# Patient Record
Sex: Female | Born: 1960
Health system: Southern US, Community
[De-identification: ages and names within clinical notes are randomized; demographics above are authoritative.]

## PROBLEM LIST (undated history)

## (undated) DIAGNOSIS — R112 Nausea with vomiting, unspecified: Secondary | ICD-10-CM

## (undated) DIAGNOSIS — E669 Obesity, unspecified: Secondary | ICD-10-CM

## (undated) DIAGNOSIS — I890 Lymphedema, not elsewhere classified: Secondary | ICD-10-CM

## (undated) DIAGNOSIS — N189 Chronic kidney disease, unspecified: Secondary | ICD-10-CM

## (undated) DIAGNOSIS — D649 Anemia, unspecified: Secondary | ICD-10-CM

## (undated) DIAGNOSIS — G4730 Sleep apnea, unspecified: Secondary | ICD-10-CM

## (undated) DIAGNOSIS — D219 Benign neoplasm of connective and other soft tissue, unspecified: Secondary | ICD-10-CM

## (undated) DIAGNOSIS — K219 Gastro-esophageal reflux disease without esophagitis: Secondary | ICD-10-CM

## (undated) DIAGNOSIS — I10 Essential (primary) hypertension: Secondary | ICD-10-CM

## (undated) DIAGNOSIS — J45909 Unspecified asthma, uncomplicated: Secondary | ICD-10-CM

## (undated) DIAGNOSIS — E785 Hyperlipidemia, unspecified: Secondary | ICD-10-CM

## (undated) DIAGNOSIS — Z794 Long term (current) use of insulin: Secondary | ICD-10-CM

## (undated) DIAGNOSIS — Z889 Allergy status to unspecified drugs, medicaments and biological substances status: Secondary | ICD-10-CM

## (undated) DIAGNOSIS — E119 Type 2 diabetes mellitus without complications: Secondary | ICD-10-CM

## (undated) DIAGNOSIS — Z9889 Other specified postprocedural states: Secondary | ICD-10-CM

## (undated) HISTORY — DX: Lymphedema, not elsewhere classified: I89.0

## (undated) HISTORY — DX: Long term (current) use of insulin: Z79.4

## (undated) HISTORY — DX: Allergy status to unspecified drugs, medicaments and biological substances: Z88.9

## (undated) HISTORY — PX: DENTAL SURGERY: SHX609

## (undated) HISTORY — DX: Essential (primary) hypertension: I10

## (undated) HISTORY — DX: Chronic kidney disease, unspecified: N18.9

## (undated) HISTORY — DX: Type 2 diabetes mellitus without complications: E11.9

## (undated) HISTORY — PX: EYE SURGERY: SHX253

## (undated) HISTORY — DX: Unspecified asthma, uncomplicated: J45.909

## (undated) HISTORY — DX: Sleep apnea, unspecified: G47.30

## (undated) HISTORY — DX: Anemia, unspecified: D64.9

## (undated) HISTORY — DX: Benign neoplasm of connective and other soft tissue, unspecified: D21.9

## (undated) HISTORY — DX: Hyperlipidemia, unspecified: E78.5

## (undated) HISTORY — DX: Obesity, unspecified: E66.9

## (undated) HISTORY — PX: CATARACT EXTRACTION W/PHACO: SHX586

## (undated) HISTORY — PX: REMOVAL OF IMPLANT: SHX6451

---

## 1994-11-13 DIAGNOSIS — IMO0001 Reserved for inherently not codable concepts without codable children: Secondary | ICD-10-CM

## 1994-11-13 HISTORY — DX: Reserved for inherently not codable concepts without codable children: IMO0001

## 2003-08-04 ENCOUNTER — Ambulatory Visit (HOSPITAL_COMMUNITY): Admission: RE | Admit: 2003-08-04 | Discharge: 2003-08-04 | Payer: Self-pay | Admitting: Obstetrics and Gynecology

## 2003-08-04 ENCOUNTER — Encounter: Payer: Self-pay | Admitting: Obstetrics and Gynecology

## 2003-08-14 ENCOUNTER — Ambulatory Visit (HOSPITAL_COMMUNITY): Admission: RE | Admit: 2003-08-14 | Discharge: 2003-08-14 | Payer: Self-pay | Admitting: Obstetrics and Gynecology

## 2003-08-14 ENCOUNTER — Encounter: Payer: Self-pay | Admitting: Obstetrics and Gynecology

## 2003-11-11 ENCOUNTER — Ambulatory Visit (HOSPITAL_COMMUNITY): Admission: RE | Admit: 2003-11-11 | Discharge: 2003-11-11 | Payer: Self-pay | Admitting: Obstetrics and Gynecology

## 2004-03-09 ENCOUNTER — Ambulatory Visit (HOSPITAL_COMMUNITY): Admission: RE | Admit: 2004-03-09 | Discharge: 2004-03-09 | Payer: Self-pay | Admitting: Obstetrics and Gynecology

## 2004-09-28 ENCOUNTER — Ambulatory Visit (HOSPITAL_COMMUNITY): Admission: RE | Admit: 2004-09-28 | Discharge: 2004-09-28 | Payer: Self-pay | Admitting: Obstetrics and Gynecology

## 2005-05-13 HISTORY — PX: RETINAL DETACHMENT SURGERY: SHX105

## 2005-05-23 ENCOUNTER — Ambulatory Visit: Payer: Self-pay | Admitting: Family Medicine

## 2005-05-31 ENCOUNTER — Ambulatory Visit: Payer: Self-pay | Admitting: Cardiology

## 2005-06-12 ENCOUNTER — Ambulatory Visit (HOSPITAL_COMMUNITY): Admission: RE | Admit: 2005-06-12 | Discharge: 2005-06-12 | Payer: Self-pay | Admitting: Cardiology

## 2005-06-12 ENCOUNTER — Ambulatory Visit: Payer: Self-pay | Admitting: Cardiology

## 2005-06-13 ENCOUNTER — Ambulatory Visit: Payer: Self-pay | Admitting: Family Medicine

## 2005-10-10 ENCOUNTER — Ambulatory Visit (HOSPITAL_COMMUNITY): Admission: RE | Admit: 2005-10-10 | Discharge: 2005-10-10 | Payer: Self-pay | Admitting: Family Medicine

## 2005-12-29 ENCOUNTER — Ambulatory Visit: Payer: Self-pay | Admitting: Family Medicine

## 2006-01-09 ENCOUNTER — Ambulatory Visit: Payer: Self-pay | Admitting: Family Medicine

## 2006-02-12 ENCOUNTER — Ambulatory Visit: Payer: Self-pay | Admitting: Family Medicine

## 2006-03-05 ENCOUNTER — Ambulatory Visit (HOSPITAL_COMMUNITY): Admission: RE | Admit: 2006-03-05 | Discharge: 2006-03-05 | Payer: Self-pay | Admitting: Orthopedic Surgery

## 2006-03-05 ENCOUNTER — Ambulatory Visit: Payer: Self-pay | Admitting: Orthopedic Surgery

## 2006-03-20 ENCOUNTER — Ambulatory Visit (HOSPITAL_COMMUNITY): Admission: RE | Admit: 2006-03-20 | Discharge: 2006-03-20 | Payer: Self-pay | Admitting: Orthopedic Surgery

## 2006-03-29 ENCOUNTER — Ambulatory Visit: Payer: Self-pay | Admitting: Orthopedic Surgery

## 2006-04-03 ENCOUNTER — Ambulatory Visit: Payer: Self-pay | Admitting: Family Medicine

## 2006-07-04 ENCOUNTER — Ambulatory Visit: Payer: Self-pay | Admitting: Family Medicine

## 2006-08-15 ENCOUNTER — Ambulatory Visit: Payer: Self-pay | Admitting: Family Medicine

## 2006-10-11 ENCOUNTER — Ambulatory Visit (HOSPITAL_COMMUNITY): Admission: RE | Admit: 2006-10-11 | Discharge: 2006-10-11 | Payer: Self-pay | Admitting: Family Medicine

## 2006-10-24 ENCOUNTER — Ambulatory Visit: Payer: Self-pay | Admitting: Family Medicine

## 2006-11-13 DIAGNOSIS — I10 Essential (primary) hypertension: Secondary | ICD-10-CM

## 2006-11-13 DIAGNOSIS — E785 Hyperlipidemia, unspecified: Secondary | ICD-10-CM

## 2006-11-13 HISTORY — DX: Essential (primary) hypertension: I10

## 2006-11-13 HISTORY — DX: Hyperlipidemia, unspecified: E78.5

## 2007-01-18 ENCOUNTER — Encounter: Payer: Self-pay | Admitting: Family Medicine

## 2007-01-18 LAB — CONVERTED CEMR LAB: Hgb A1c MFr Bld: 8.6 % — ABNORMAL HIGH (ref 4.6–6.1)

## 2007-01-23 ENCOUNTER — Ambulatory Visit: Payer: Self-pay | Admitting: Family Medicine

## 2007-02-16 ENCOUNTER — Emergency Department (HOSPITAL_COMMUNITY): Admission: EM | Admit: 2007-02-16 | Discharge: 2007-02-16 | Payer: Self-pay | Admitting: Emergency Medicine

## 2007-02-20 ENCOUNTER — Ambulatory Visit (HOSPITAL_COMMUNITY): Admission: RE | Admit: 2007-02-20 | Discharge: 2007-02-20 | Payer: Self-pay | Admitting: Family Medicine

## 2007-02-20 ENCOUNTER — Ambulatory Visit: Payer: Self-pay | Admitting: Family Medicine

## 2007-04-18 ENCOUNTER — Encounter: Payer: Self-pay | Admitting: Family Medicine

## 2007-04-18 LAB — CONVERTED CEMR LAB
ALT: 13 U/L
AST: 13 U/L
Albumin: 4.1 g/dL
Alkaline Phosphatase: 86 U/L
BUN: 14 mg/dL
Basophils Absolute: 0.1 K/uL
Basophils Relative: 2 % — ABNORMAL HIGH
Bilirubin, Direct: 0.1 mg/dL
CO2: 25 meq/L
Calcium: 9.1 mg/dL
Chloride: 103 meq/L
Cholesterol: 168 mg/dL
Creatinine, Ser: 0.75 mg/dL
Eosinophils Absolute: 0.3 K/uL
Eosinophils Relative: 4 %
Glucose, Bld: 86 mg/dL
HCT: 39.4 %
HDL: 55 mg/dL
Hemoglobin: 12.7 g/dL
Hgb A1c MFr Bld: 7.5 % — ABNORMAL HIGH
Indirect Bilirubin: 0.4 mg/dL
LDL Cholesterol: 99 mg/dL
Lymphocytes Relative: 32 %
Lymphs Abs: 2.3 K/uL
MCHC: 32.2 g/dL
MCV: 91 fL
Monocytes Absolute: 0.5 K/uL
Monocytes Relative: 7 %
Neutro Abs: 3.8 K/uL
Neutrophils Relative %: 55 %
Platelets: 358 K/uL
Potassium: 4.4 meq/L
RBC: 4.33 M/uL
RDW: 14.5 % — ABNORMAL HIGH
Sodium: 139 meq/L
Total Bilirubin: 0.5 mg/dL
Total CHOL/HDL Ratio: 3.1
Total Protein: 7.5 g/dL
Triglycerides: 69 mg/dL
VLDL: 14 mg/dL
WBC: 7 10*3/microliter

## 2007-04-25 ENCOUNTER — Ambulatory Visit: Payer: Self-pay | Admitting: Family Medicine

## 2007-06-05 ENCOUNTER — Ambulatory Visit (HOSPITAL_COMMUNITY): Admission: RE | Admit: 2007-06-05 | Discharge: 2007-06-05 | Payer: Self-pay | Admitting: Obstetrics and Gynecology

## 2007-06-06 ENCOUNTER — Ambulatory Visit: Payer: Self-pay | Admitting: Family Medicine

## 2007-07-18 ENCOUNTER — Encounter: Payer: Self-pay | Admitting: Family Medicine

## 2007-07-18 LAB — CONVERTED CEMR LAB: Microalb, Ur: 7.5 mg/dL — ABNORMAL HIGH (ref 0.00–1.89)

## 2007-07-29 ENCOUNTER — Ambulatory Visit (HOSPITAL_COMMUNITY): Admission: RE | Admit: 2007-07-29 | Discharge: 2007-07-29 | Payer: Self-pay | Admitting: Family Medicine

## 2007-10-14 ENCOUNTER — Ambulatory Visit (HOSPITAL_COMMUNITY): Admission: RE | Admit: 2007-10-14 | Discharge: 2007-10-14 | Payer: Self-pay | Admitting: Family Medicine

## 2007-10-28 ENCOUNTER — Ambulatory Visit: Payer: Self-pay | Admitting: Family Medicine

## 2007-10-28 LAB — CONVERTED CEMR LAB
ALT: 11 units/L (ref 0–35)
AST: 13 units/L (ref 0–37)
Albumin: 4.3 g/dL (ref 3.5–5.2)
Alkaline Phosphatase: 120 units/L — ABNORMAL HIGH (ref 39–117)
BUN: 13 mg/dL (ref 6–23)
Bilirubin, Direct: 0.1 mg/dL (ref 0.0–0.3)
CO2: 24 meq/L (ref 19–32)
Calcium: 9.3 mg/dL (ref 8.4–10.5)
Chloride: 101 meq/L (ref 96–112)
Cholesterol: 167 mg/dL (ref 0–200)
Creatinine, Ser: 0.7 mg/dL (ref 0.40–1.20)
Glucose, Bld: 80 mg/dL (ref 70–99)
HDL: 56 mg/dL (ref >39)
Indirect Bilirubin: 0.5 mg/dL (ref 0.0–0.9)
LDL Cholesterol: 94 mg/dL (ref 0–99)
Potassium: 3.9 meq/L (ref 3.5–5.3)
Sodium: 138 meq/L (ref 135–145)
TSH: 2.614 microintl units/mL (ref 0.350–5.50)
Total Bilirubin: 0.6 mg/dL (ref 0.3–1.2)
Total CHOL/HDL Ratio: 3
Total Protein: 7.5 g/dL (ref 6.0–8.3)
Triglycerides: 84 mg/dL (ref <150)
VLDL: 17 mg/dL (ref 0–40)

## 2007-11-11 ENCOUNTER — Ambulatory Visit (HOSPITAL_COMMUNITY): Admission: RE | Admit: 2007-11-11 | Discharge: 2007-11-11 | Payer: Self-pay | Admitting: Family Medicine

## 2008-01-27 ENCOUNTER — Ambulatory Visit: Admission: RE | Admit: 2008-01-27 | Discharge: 2008-01-27 | Payer: Self-pay | Admitting: *Deleted

## 2008-02-18 ENCOUNTER — Ambulatory Visit: Payer: Self-pay | Admitting: Family Medicine

## 2008-02-25 DIAGNOSIS — N183 Chronic kidney disease, stage 3 (moderate): Secondary | ICD-10-CM

## 2008-02-25 DIAGNOSIS — I10 Essential (primary) hypertension: Secondary | ICD-10-CM | POA: Insufficient documentation

## 2008-02-25 DIAGNOSIS — E119 Type 2 diabetes mellitus without complications: Secondary | ICD-10-CM | POA: Insufficient documentation

## 2008-02-25 DIAGNOSIS — E1169 Type 2 diabetes mellitus with other specified complication: Secondary | ICD-10-CM | POA: Insufficient documentation

## 2008-02-25 DIAGNOSIS — E785 Hyperlipidemia, unspecified: Secondary | ICD-10-CM | POA: Insufficient documentation

## 2008-02-25 DIAGNOSIS — E1165 Type 2 diabetes mellitus with hyperglycemia: Secondary | ICD-10-CM

## 2008-02-25 DIAGNOSIS — E782 Mixed hyperlipidemia: Secondary | ICD-10-CM

## 2008-02-25 DIAGNOSIS — E1122 Type 2 diabetes mellitus with diabetic chronic kidney disease: Secondary | ICD-10-CM

## 2008-05-18 ENCOUNTER — Encounter: Payer: Self-pay | Admitting: Family Medicine

## 2008-05-18 LAB — CONVERTED CEMR LAB
ALT: 17 units/L (ref 0–35)
AST: 14 units/L (ref 0–37)
Albumin: 4.2 g/dL (ref 3.5–5.2)
Alkaline Phosphatase: 127 units/L — ABNORMAL HIGH (ref 39–117)
BUN: 13 mg/dL (ref 6–23)
Bilirubin, Direct: 0.1 mg/dL (ref 0.0–0.3)
CO2: 23 meq/L (ref 19–32)
Calcium: 9.3 mg/dL (ref 8.4–10.5)
Chloride: 100 meq/L (ref 96–112)
Cholesterol: 159 mg/dL (ref 0–200)
Creatinine, Ser: 0.79 mg/dL (ref 0.40–1.20)
Glucose, Bld: 116 mg/dL — ABNORMAL HIGH (ref 70–99)
HDL: 52 mg/dL (ref >39)
Indirect Bilirubin: 0.5 mg/dL (ref 0.0–0.9)
LDL Cholesterol: 96 mg/dL (ref 0–99)
Potassium: 4.4 meq/L (ref 3.5–5.3)
Sodium: 136 meq/L (ref 135–145)
Total Bilirubin: 0.6 mg/dL (ref 0.3–1.2)
Total CHOL/HDL Ratio: 3.1
Total Protein: 7.4 g/dL (ref 6.0–8.3)
Triglycerides: 56 mg/dL (ref <150)
VLDL: 11 mg/dL (ref 0–40)

## 2008-05-25 ENCOUNTER — Ambulatory Visit: Payer: Self-pay | Admitting: Family Medicine

## 2008-07-29 ENCOUNTER — Telehealth: Payer: Self-pay | Admitting: Family Medicine

## 2008-09-02 ENCOUNTER — Telehealth: Payer: Self-pay | Admitting: Family Medicine

## 2008-10-14 ENCOUNTER — Ambulatory Visit (HOSPITAL_COMMUNITY): Admission: RE | Admit: 2008-10-14 | Discharge: 2008-10-14 | Payer: Self-pay | Admitting: Obstetrics and Gynecology

## 2008-10-26 ENCOUNTER — Ambulatory Visit: Payer: Self-pay | Admitting: Family Medicine

## 2008-10-26 ENCOUNTER — Telehealth: Payer: Self-pay | Admitting: Family Medicine

## 2008-10-29 ENCOUNTER — Ambulatory Visit: Payer: Self-pay | Admitting: Family Medicine

## 2008-10-29 LAB — CONVERTED CEMR LAB
Glucose, Bld: 136 mg/dL
Hgb A1c MFr Bld: 9 %

## 2008-10-30 ENCOUNTER — Encounter: Payer: Self-pay | Admitting: Family Medicine

## 2008-10-30 DIAGNOSIS — B353 Tinea pedis: Secondary | ICD-10-CM | POA: Insufficient documentation

## 2008-10-30 DIAGNOSIS — D259 Leiomyoma of uterus, unspecified: Secondary | ICD-10-CM | POA: Insufficient documentation

## 2008-12-24 ENCOUNTER — Telehealth: Payer: Self-pay | Admitting: Family Medicine

## 2009-01-20 ENCOUNTER — Encounter: Payer: Self-pay | Admitting: Family Medicine

## 2009-01-20 LAB — CONVERTED CEMR LAB
ALT: 11 units/L (ref 0–35)
AST: 10 units/L (ref 0–37)
Albumin: 4.2 g/dL (ref 3.5–5.2)
Alkaline Phosphatase: 118 units/L — ABNORMAL HIGH (ref 39–117)
BUN: 12 mg/dL (ref 6–23)
Bilirubin, Direct: 0.1 mg/dL (ref 0.0–0.3)
CO2: 25 meq/L (ref 19–32)
Calcium: 9.2 mg/dL (ref 8.4–10.5)
Chloride: 104 meq/L (ref 96–112)
Cholesterol: 161 mg/dL (ref 0–200)
Creatinine, Ser: 0.69 mg/dL (ref 0.40–1.20)
Glucose, Bld: 57 mg/dL — ABNORMAL LOW (ref 70–99)
HDL: 53 mg/dL (ref >39)
Indirect Bilirubin: 0.3 mg/dL (ref 0.0–0.9)
LDL Cholesterol: 96 mg/dL (ref 0–99)
Potassium: 4 meq/L (ref 3.5–5.3)
Sodium: 141 meq/L (ref 135–145)
Total Bilirubin: 0.4 mg/dL (ref 0.3–1.2)
Total CHOL/HDL Ratio: 3
Total Protein: 7 g/dL (ref 6.0–8.3)
Triglycerides: 58 mg/dL (ref <150)
VLDL: 12 mg/dL (ref 0–40)

## 2009-01-21 ENCOUNTER — Encounter: Payer: Self-pay | Admitting: Family Medicine

## 2009-01-28 ENCOUNTER — Ambulatory Visit: Payer: Self-pay | Admitting: Family Medicine

## 2009-01-28 DIAGNOSIS — F3289 Other specified depressive episodes: Secondary | ICD-10-CM | POA: Insufficient documentation

## 2009-01-28 DIAGNOSIS — F329 Major depressive disorder, single episode, unspecified: Secondary | ICD-10-CM | POA: Insufficient documentation

## 2009-01-28 DIAGNOSIS — N912 Amenorrhea, unspecified: Secondary | ICD-10-CM | POA: Insufficient documentation

## 2009-01-28 LAB — CONVERTED CEMR LAB
Beta hcg, urine, semiquantitative: NEGATIVE
Glucose, Bld: 116 mg/dL
Hgb A1c MFr Bld: 9.4 %

## 2009-01-29 ENCOUNTER — Encounter: Payer: Self-pay | Admitting: Family Medicine

## 2009-01-29 LAB — CONVERTED CEMR LAB: Retic Ct Pct: 1.1 % (ref 0.4–3.1)

## 2009-01-30 LAB — CONVERTED CEMR LAB
Creatinine, Urine: 59.8 mg/dL
Microalb Creat Ratio: 28.1 mg/g (ref 0.0–30.0)
Microalb, Ur: 1.68 mg/dL (ref 0.00–1.89)

## 2009-02-04 ENCOUNTER — Telehealth: Payer: Self-pay | Admitting: Family Medicine

## 2009-02-08 ENCOUNTER — Encounter: Payer: Self-pay | Admitting: Family Medicine

## 2009-03-01 ENCOUNTER — Encounter: Payer: Self-pay | Admitting: Family Medicine

## 2009-03-04 ENCOUNTER — Encounter: Payer: Self-pay | Admitting: Family Medicine

## 2009-03-08 ENCOUNTER — Encounter: Payer: Self-pay | Admitting: Family Medicine

## 2009-04-01 ENCOUNTER — Encounter: Payer: Self-pay | Admitting: Family Medicine

## 2009-04-23 ENCOUNTER — Telehealth: Payer: Self-pay | Admitting: Family Medicine

## 2009-05-22 ENCOUNTER — Telehealth (INDEPENDENT_AMBULATORY_CARE_PROVIDER_SITE_OTHER): Payer: Self-pay | Admitting: Family Medicine

## 2009-05-24 ENCOUNTER — Telehealth: Payer: Self-pay | Admitting: Family Medicine

## 2009-07-12 ENCOUNTER — Encounter: Payer: Self-pay | Admitting: Family Medicine

## 2009-07-22 ENCOUNTER — Ambulatory Visit: Payer: Self-pay | Admitting: Family Medicine

## 2009-07-22 ENCOUNTER — Ambulatory Visit (HOSPITAL_COMMUNITY): Admission: RE | Admit: 2009-07-22 | Discharge: 2009-07-22 | Payer: Self-pay | Admitting: Family Medicine

## 2009-07-22 DIAGNOSIS — M79609 Pain in unspecified limb: Secondary | ICD-10-CM | POA: Insufficient documentation

## 2009-07-22 LAB — CONVERTED CEMR LAB
Glucose, Bld: 139 mg/dL
Hgb A1c MFr Bld: 7 %

## 2009-09-24 ENCOUNTER — Encounter: Payer: Self-pay | Admitting: Family Medicine

## 2009-10-20 ENCOUNTER — Ambulatory Visit (HOSPITAL_COMMUNITY): Admission: RE | Admit: 2009-10-20 | Discharge: 2009-10-20 | Payer: Self-pay | Admitting: Family Medicine

## 2009-11-13 HISTORY — PX: REFRACTIVE SURGERY: SHX103

## 2009-12-07 ENCOUNTER — Encounter: Payer: Self-pay | Admitting: Family Medicine

## 2009-12-08 ENCOUNTER — Encounter: Payer: Self-pay | Admitting: Family Medicine

## 2009-12-13 ENCOUNTER — Telehealth: Payer: Self-pay | Admitting: Family Medicine

## 2009-12-14 ENCOUNTER — Telehealth: Payer: Self-pay | Admitting: Family Medicine

## 2009-12-29 ENCOUNTER — Ambulatory Visit: Payer: Self-pay | Admitting: Family Medicine

## 2009-12-29 LAB — CONVERTED CEMR LAB: Glucose, Bld: 149 mg/dL

## 2010-01-17 ENCOUNTER — Encounter: Payer: Self-pay | Admitting: Family Medicine

## 2010-03-08 ENCOUNTER — Encounter: Payer: Self-pay | Admitting: Family Medicine

## 2010-03-22 ENCOUNTER — Encounter: Payer: Self-pay | Admitting: Family Medicine

## 2010-03-28 ENCOUNTER — Telehealth: Payer: Self-pay | Admitting: Family Medicine

## 2010-05-09 ENCOUNTER — Telehealth: Payer: Self-pay | Admitting: Family Medicine

## 2010-07-28 ENCOUNTER — Telehealth: Payer: Self-pay | Admitting: Family Medicine

## 2010-07-29 ENCOUNTER — Encounter: Payer: Self-pay | Admitting: Family Medicine

## 2010-09-29 ENCOUNTER — Ambulatory Visit: Payer: Self-pay | Admitting: Family Medicine

## 2010-10-25 ENCOUNTER — Ambulatory Visit (HOSPITAL_COMMUNITY)
Admission: RE | Admit: 2010-10-25 | Discharge: 2010-10-25 | Payer: Self-pay | Source: Home / Self Care | Attending: Family Medicine | Admitting: Family Medicine

## 2010-10-26 ENCOUNTER — Telehealth: Payer: Self-pay | Admitting: Family Medicine

## 2010-11-11 ENCOUNTER — Ambulatory Visit
Admission: RE | Admit: 2010-11-11 | Discharge: 2010-11-11 | Payer: Self-pay | Source: Home / Self Care | Attending: Family Medicine | Admitting: Family Medicine

## 2010-11-11 ENCOUNTER — Ambulatory Visit (HOSPITAL_COMMUNITY)
Admission: RE | Admit: 2010-11-11 | Discharge: 2010-11-11 | Payer: Self-pay | Source: Home / Self Care | Attending: Family Medicine | Admitting: Family Medicine

## 2010-11-11 ENCOUNTER — Telehealth: Payer: Self-pay | Admitting: Family Medicine

## 2010-11-11 DIAGNOSIS — M542 Cervicalgia: Secondary | ICD-10-CM | POA: Insufficient documentation

## 2010-11-16 ENCOUNTER — Encounter (HOSPITAL_COMMUNITY)
Admission: RE | Admit: 2010-11-16 | Discharge: 2010-12-13 | Payer: Self-pay | Source: Home / Self Care | Attending: Family Medicine | Admitting: Family Medicine

## 2010-11-28 ENCOUNTER — Encounter: Payer: Self-pay | Admitting: Family Medicine

## 2010-12-03 ENCOUNTER — Encounter: Payer: Self-pay | Admitting: Obstetrics and Gynecology

## 2010-12-12 ENCOUNTER — Telehealth: Payer: Self-pay | Admitting: Family Medicine

## 2010-12-13 ENCOUNTER — Telehealth: Payer: Self-pay | Admitting: Family Medicine

## 2010-12-13 NOTE — Assessment & Plan Note (Signed)
Summary: OV   Vital Signs:  Patient profile:   50 year old female Menstrual status:  irregular Height:      65 inches Weight:      279 pounds BMI:     46.60 O2 Sat:      96 % Pulse rate:   96 / minute Pulse rhythm:   regular Resp:     16 per minute BP sitting:   122 / 70  (left arm) Cuff size:   xl  Vitals Entered By: Everitt Amber (July 22, 2009 3:44 PM)  Nutrition Counseling: Patient's BMI is greater than 25 and therefore counseled on weight management options. CC: her left foot hurting on bottom of heel for about 3 day Is Patient Diabetic? Yes  Pain Assessment Patient in pain? yes     Location: left heel Intensity: 8 Type: sharp Onset of pain  3 days   CC:  her left foot hurting on bottom of heel for about 3 day.  History of Present Illness: 4 day h/o left heel pain, worse when she just stands. She has had no traumato the foot and this is the first episode. She denies fever or chills. She denies head or chest congestion, dysuria or frequency. She is now having her blood sugar followed by endocrine, kumar, has been change to victoza from byetta, with marked improvement in her blood sugars. Her eye exam is due and she will schedule  Current Medications (verified): 1)  Aspirin 81 Mg  Tbec (Aspirin) .... One Tab By Mouth Once Daily 2)  Metformin Hcl 1000 Mg  Tabs (Metformin Hcl) .... One Tab By Mouth Two Times A Day 3)  Klor-Con M10 10 Meq  Tbcr (Potassium Chloride Crys Cr) .... Two Tabs By Mouth Once Daily 4)  Lasix 20 Mg  Tabs (Furosemide) .... One Tab By Mouth Once Daily 5)  Lantus 100 Unit/ml  Soln (Insulin Glargine) .... Inject 65 Units Once Daily 6)  Diltiazem Hcl Er Beads 360 Mg  Cp24 (Diltiazem Hcl Er Beads) .... One Tab By Mouth Once Daily 7)  Simvastatin 80 Mg  Tabs (Simvastatin) .... One Tab By Mouth At Bedtime 8)  Benicar 40 Mg  Tabs (Olmesartan Medoxomil) .... One Tab By Mouth Once Daily 9)  Delsym 30 Mg/13ml Lqcr (Dextromethorphan Polistirex) .... As  Needed 10)  Symbicort 80-4.5 Mcg/act Aero (Budesonide-Formoterol Fumarate) .... Two Puffs Bid 11)  Cyclobenzaprine Hcl 10 Mg Tabs (Cyclobenzaprine Hcl) .... One Tab By Mouth Qhs 12)  Fexofenadine Hcl 180 Mg Tabs (Fexofenadine Hcl) .... One Tab By Mouth Qd 13)  Accu-Chek Compact  Strp (Glucose Blood) .... Four Times Daily Testing  Allergies (verified): No Known Drug Allergies  Review of Systems      See HPI General:  See HPI. ENT:  Denies earache, hoarseness, nasal congestion, and sinus pressure. CV:  Denies chest pain or discomfort, palpitations, and swelling of hands. Resp:  Denies cough and sputum productive. GI:  Denies abdominal pain, constipation, diarrhea, nausea, and vomiting. GU:  Denies dysuria and urinary frequency. MS:  See HPI; Complains of joint pain; left heel pain x 4 days worse when she jusrt stands. Psych:  Denies anxiety and depression. Endo:  See HPI.  Physical Exam  General:  alert, well-hydrated, and overweight-appearing.  HEENT: No facial asymmetry,  EOMI, No sinus tenderness, TM's Clear, oropharynx  pink and moist.   Chest: Clear to auscultation bilaterally.  CVS: S1, S2, No murmurs, No S3.   Abd: Soft, Nontender.  MS: Adequate  ROM spine, hips, shoulders and knees.  Ext: No edema.   CNS: CN 2-12 intact, power tone and sensation normal throughout.   Skin: Intact, no visible lesions or rashes.  Psych: Good eye contact, normal affect.  Memory intact, not anxious or depressed appearing.     Impression & Recommendations:  Problem # 1:  HEEL PAIN, LEFT (ICD-729.5) Assessment Comment Only  Orders: Radiology other (Radiology Other) Podiatry Referral (Podiatry)  Problem # 2:  HYPERLIPIDEMIA (ICD-272.4) Assessment: Comment Only  Her updated medication list for this problem includes:    Simvastatin 80 Mg Tabs (Simvastatin) ..... One tab by mouth at bedtime will obtain most recent labs from he endocrine when he sends them  Problem # 3:  OBESITY,  UNSPECIFIED (ICD-278.00) Assessment: Improved  Ht: 65 (07/22/2009)   Wt: 279 (07/22/2009)   BMI: 46.60 (07/22/2009)  Problem # 4:  HYPERTENSION (ICD-401.9) Assessment: Improved  The following medications were removed from the medication list:    Cardizem 120 Mg Tabs (Diltiazem hcl) .Marland KitchenMarland KitchenMarland KitchenMarland Kitchen 3 tablets once daily Her updated medication list for this problem includes:    Lasix 20 Mg Tabs (Furosemide) ..... One tab by mouth once daily    Diltiazem Hcl Er Beads 360 Mg Cp24 (Diltiazem hcl er beads) ..... One tab by mouth once daily    Benicar 40 Mg Tabs (Olmesartan medoxomil) ..... One tab by mouth once daily  BP today: 122/70 Prior BP: 138/70 (01/28/2009)  Labs Reviewed: K+: 4.0 (01/20/2009) Creat: : 0.69 (01/20/2009)   Chol: 161 (01/20/2009)   HDL: 53 (01/20/2009)   LDL: 96 (01/20/2009)   TG: 58 (01/20/2009)  Problem # 5:  IDDM (ICD-250.01) Assessment: Improved  The following medications were removed from the medication list:    Glipizide Xl 10 Mg Tb24 (Glipizide) ..... One tab by mouth two times a day    Byetta 10 Mcg Pen 10 Mcg/0.34ml Soln (Exenatide) ..... Inject once twice a day    Lantus Solostar 100 Unit/ml Soln (Insulin glargine) ..... Inject 100 units subcutaneously every evening Her updated medication list for this problem includes:    Aspirin 81 Mg Tbec (Aspirin) ..... One tab by mouth once daily    Metformin Hcl 1000 Mg Tabs (Metformin hcl) ..... One tab by mouth two times a day    Lantus 100 Unit/ml Soln (Insulin glargine) ..... Inject 65 units once daily    Benicar 40 Mg Tabs (Olmesartan medoxomil) ..... One tab by mouth once daily victoza reportedly 1.2 daily Orders: Glucose, (CBG) (82962) Hemoglobin A1C (83036)7.0 in our office, reportedly 7.2 at endo yesterday  Complete Medication List: 1)  Aspirin 81 Mg Tbec (Aspirin) .... One tab by mouth once daily 2)  Metformin Hcl 1000 Mg Tabs (Metformin hcl) .... One tab by mouth two times a day 3)  Klor-con M10 10 Meq Tbcr  (Potassium chloride crys cr) .... Two tabs by mouth once daily 4)  Lasix 20 Mg Tabs (Furosemide) .... One tab by mouth once daily 5)  Lantus 100 Unit/ml Soln (Insulin glargine) .... Inject 65 units once daily 6)  Diltiazem Hcl Er Beads 360 Mg Cp24 (Diltiazem hcl er beads) .... One tab by mouth once daily 7)  Simvastatin 80 Mg Tabs (Simvastatin) .... One tab by mouth at bedtime 8)  Benicar 40 Mg Tabs (Olmesartan medoxomil) .... One tab by mouth once daily 9)  Delsym 30 Mg/21ml Lqcr (Dextromethorphan polistirex) .... As needed 10)  Symbicort 80-4.5 Mcg/act Aero (Budesonide-formoterol fumarate) .... Two puffs bid 11)  Cyclobenzaprine Hcl 10 Mg  Tabs (Cyclobenzaprine hcl) .... One tab by mouth qhs 12)  Fexofenadine Hcl 180 Mg Tabs (Fexofenadine hcl) .... One tab by mouth qd 13)  Accu-chek Compact Strp (Glucose blood) .... Four times daily testing 14)  Ibuprofen 800 Mg Tabs (Ibuprofen) .... One tab by mouth three times a day prn  Patient Instructions: 1)  Please smonths. 2)  It is important that you exercise regularly at least 20 minutes 5 times a week. If you develop chest pain, have severe difficulty breathing, or feel very tired , stop exercising immediately and seek medical attention. 3)  You need to lose weight. Consider a lower calorie diet and regular exercise.  4)  Congrats on marked improvement of your blood sugars. 5)  You will get an x ray of your heel and be referred to podiatrisry.in [employer] 6)  See your eye doctor yearly to check for diabetic eye damage.pls make appt with Dr Ashley Royalty. Prescriptions: IBUPROFEN 800 MG TABS (IBUPROFEN) one tab by mouth three times a day prn  #30 x 0   Entered by:   Worthy Keeler LPN   Authorized by:   Syliva Overman MD   Signed by:   Worthy Keeler LPN on 16/08/9603   Method used:   Electronically to        [facility] [address] [facility] (retail)       [address]       [place], [place]  [address]       Ph: [phone]       Fax:  [phone]   RxID:   [id number]   Laboratory Results   Blood Tests   Date/Time Received: July 22, 2009  Date/Time Reported: July 22, 2009   Glucose (random): 139 mg/dL   (Normal Range: 10-272) HGBA1C: 7.0%   (Normal Range: Non-Diabetic - 3-6%   Control Diabetic - 6-8%)     Appended Document: OV pls do not bill hBA1C, she had this at endo yesterday, we did not know  Appended Document: OV this will not be billed

## 2010-12-13 NOTE — Progress Notes (Signed)
Summary: breathing treatment  Phone Note Call from Patient   Summary of Call: needs to get a breathing treatment has really bad cold. [phone]  Initial call taken by: Rudene Anda,  October 26, 2008 8:31 AM  Follow-up for Phone Call        if coughing up yellow sputum pls wk in this morning Follow-up by: Syliva Overman MD,  October 26, 2008 10:00 AM  Additional Follow-up for Phone Call Additional follow up Details #1::        non productive cough, runny nose with clear drainage, no fever, states feels she needs a breathing treatment. advised to come in for neb per dr Janika Jedlicka Additional Follow-up by: Worthy Keeler LPN,  October 26, 2008 10:16 AM    Additional Follow-up for Phone Call Additional follow up Details #2::    patient states cough, not productive, runny nose, clear- complains of congestion only, patient has appt. thursday

## 2010-12-13 NOTE — Progress Notes (Signed)
Summary: MED  Phone Note Call from Patient   Summary of Call: CALL HER AT [phone] OR AT [phone] ABOUT HER MEDICINE THE DILTIAZEM 360 MG  SUPPOSE TO BE 1 PILL 2X A DAY  WANTS TO KNOW U HAVE SAMPLES SHE IS ABOUT TO RUN OUT Initial call taken by: Lind Guest,  February 04, 2009 8:51 AM  Follow-up for Phone Call        is diltiazem once or twice daily for this patient? it states once daily but she says it should be bid Follow-up by: Worthy Keeler LPN,  February 04, 2009 9:15 AM  Additional Follow-up for Phone Call Additional follow up Details #1::        pt had doubled cardizem dose unknowingly, a new script is sent in for 120mg  3 daily Additional Follow-up by: Syliva Overman MD,  February 04, 2009 1:16 PM    New/Updated Medications: CARDIZEM 120 MG TABS (DILTIAZEM HCL) 3 tablets once daily   Prescriptions: CARDIZEM 120 MG TABS (DILTIAZEM HCL) 3 tablets once daily  #90 x 0   Entered and Authorized by:   Syliva Overman MD   Signed by:   Syliva Overman MD on 02/04/2009   Method used:   Electronically to        [facility]  [address] [address] [facility] (retail)       [address]       [place], [place]  [address]       Ph: [phone]       Fax: [phone]   RxID:   [id number]

## 2010-12-13 NOTE — Progress Notes (Signed)
  Phone Note Call from Patient   Caller: Patient Summary of Call: stuffy  head, cough, clear sputum, no fever, no chills, some body aches allegra not helping [phone] Initial call taken by: Worthy Keeler LPN,  December 14, 2009 8:10 AM  Follow-up for Phone Call        Advise pt to use Tylenol or Ibuprofen as needed for body aches. May also use an OTC cough med in addition to the Allegra.  If symptoms worsen, persist for > 1 wk, or develops a fever will need an appt. Follow-up by: Esperanza Sheets PA,  December 14, 2009 1:21 PM  Additional Follow-up for Phone Call Additional follow up Details #1::        returned call, left message Additional Follow-up by: Worthy Keeler LPN,  December 14, 2009 1:47 PM    Additional Follow-up for Phone Call Additional follow up Details #2::    PATIENT AWARE Follow-up by: Lind Guest,  December 14, 2009 3:57 PM

## 2010-12-13 NOTE — Progress Notes (Signed)
  Phone Note Other Incoming   Caller: dr simpson  Summary of Call: pls sched f/u in june or July with me, let her know I specificallly want to f/u th blood pressure Initial call taken by: Syliva Overman MD,  Mar 28, 2010 11:22 PM  Follow-up for Phone Call        APPT 7.28.11 @ 4:00 Follow-up by: Lind Guest,  Mar 29, 2010 10:59 AM  Additional Follow-up for Phone Call Additional follow up Details #1::        thanks Additional Follow-up by: Syliva Overman MD,  Mar 29, 2010 2:36 PM

## 2010-12-13 NOTE — Progress Notes (Signed)
Summary: dr.kumar  dr.kumar   Imported By: Lind Guest 09/27/2009 11:06:12  _____________________________________________________________________  External Attachment:    Type:   Image     Comment:   External Document

## 2010-12-13 NOTE — Miscellaneous (Signed)
Summary: refill  Clinical Lists Changes  Medications: Added new medication of SYMBICORT 80-4.5 MCG/ACT AERO (BUDESONIDE-FORMOTEROL FUMARATE) two puffs bid - Signed Rx of SYMBICORT 80-4.5 MCG/ACT AERO (BUDESONIDE-FORMOTEROL FUMARATE) two puffs bid;  #1 x 2;  Signed;  Entered by: Worthy Keeler LPN;  Authorized by: Syliva Overman MD;  Method used: Handwritten    Prescriptions: SYMBICORT 80-4.5 MCG/ACT AERO (BUDESONIDE-FORMOTEROL FUMARATE) two puffs bid  #1 x 2   Entered by:   Worthy Keeler LPN   Authorized by:   Syliva Overman MD   Signed by:   Worthy Keeler LPN on 81/19/1478   Method used:   Handwritten   RxID:   [id number]

## 2010-12-13 NOTE — Assessment & Plan Note (Signed)
Summary: NURSE VISIT  Nurse Visit   Vital Signs:  Patient Profile:   50 Years Old Female CC:      PATIENT COMPLAINS OF CONGESTION, NON PRODUCTIVE COUGH Weight:      283.19 pounds O2 Sat:      95 % Pulse rate:   109 / minute BP sitting:   140 / 78  (left arm)  Vitals Entered By: Worthy Keeler LPN (October 26, 2008 11:21 AM)                 Prior Medications: ASPIRIN 81 MG  TBEC (ASPIRIN) one tab by mouth once daily METANX 2.8-25-2 MG  TABS (L-METHYLFOLATE-B6-B12) one tab by mouth once daily METFORMIN HCL 1000 MG  TABS (METFORMIN HCL) one tab by mouth two times a day KLOR-CON M10 10 MEQ  TBCR (POTASSIUM CHLORIDE CRYS CR) two tabs by mouth once daily LASIX 20 MG  TABS (FUROSEMIDE) one tab by mouth once daily GLIPIZIDE XL 10 MG  TB24 (GLIPIZIDE) one tab by mouth two times a day LANTUS 100 UNIT/ML  SOLN (INSULIN GLARGINE) inject 100 units once daily BYETTA 10 MCG PEN 10 MCG/0.04ML  SOLN (EXENATIDE) inject once twice a day DILTIAZEM HCL ER BEADS 360 MG  CP24 (DILTIAZEM HCL ER BEADS) one tab by mouth once daily SIMVASTATIN 80 MG  TABS (SIMVASTATIN) one tab by mouth at bedtime BENICAR 40 MG  TABS (OLMESARTAN MEDOXOMIL) one tab by mouth once daily Current Allergies: No known allergies     Medication Administration  Medication # 1:    Medication: Albuterol Sulfate Sol 1mg  unit dose    Diagnosis: ACUTE BRONCHITIS (ICD-466.0)    Dose: 2.5MG Ronny Bacon    Route: inhaled    Exp Date: 3/11    Lot #: [id number]    Mfr: DEY    Patient tolerated medication without complications    Given by: Worthy Keeler LPN (October 26, 2008 11:24 AM)  Medication # 2:    Medication: Atrovent 1mg  (Neb)    Diagnosis: ACUTE BRONCHITIS (ICD-466.0)    Dose: 0.5MG /2.5ML    Route: inhaled    Exp Date: 4/11    Lot #: [id number]    Mfr: DEY    Patient tolerated medication without complications    Given by: Worthy Keeler LPN (October 26, 2008 11:24 AM)  Orders Added: 1)  Albuterol Sulfate Sol 1mg  unit dose  [J7613] 2)  Atrovent 1mg  (Neb) [V7846] 3)  Admin of Therapeutic Inj  intramuscular or subcutaneous [96372] 4)  Nebulizer Tx Kaloni.Humble    ]

## 2010-12-13 NOTE — Miscellaneous (Signed)
Summary: refill  Clinical Lists Changes  Medications: Rx of KLOR-CON M10 10 MEQ  TBCR (POTASSIUM CHLORIDE CRYS CR) two tabs by mouth once daily;  #60 x 0;  Signed;  Entered by: Worthy Keeler LPN;  Authorized by: Syliva Overman MD;  Method used: Electronically to [facility] [address], [place], [place], [place]  [address], Ph: [phone], Fax: [phone]    Prescriptions: KLOR-CON M10 10 MEQ  TBCR (POTASSIUM CHLORIDE CRYS CR) two tabs by mouth once daily  #60 x 0   Entered by:   Worthy Keeler LPN   Authorized by:   Syliva Overman MD   Signed by:   Worthy Keeler LPN on 13/06/6577   Method used:   Electronically to        [facility]  [address] (retail)       [address]       [place], [place]  [address]       Ph: [phone]       Fax: [phone]   RxID:   [id number]

## 2010-12-13 NOTE — Miscellaneous (Signed)
Summary: refill  Clinical Lists Changes  Medications: Changed medication from IBUPROFEN 800 MG TABS (IBUPROFEN) one tab by mouth three times a day prn to IBUPROFEN 800 MG TABS (IBUPROFEN) one tab by mouth three times a day as needed only. (60 to last 90 days) - Signed Rx of KLOR-CON M10 10 MEQ  TBCR (POTASSIUM CHLORIDE CRYS CR) two tabs by mouth once daily;  #180 x 0;  Signed;  Entered by: Everitt Amber;  Authorized by: Syliva Overman MD;  Method used: Electronically to [facility] [address] Shipping/mailing, [place], [place]  [address], Ph: [phone], Fax: [phone] Rx of LASIX 20 MG  TABS (FUROSEMIDE) one tab by mouth once daily;  #90 Each x 0;  Signed;  Entered by: Everitt Amber;  Authorized by: Syliva Overman MD;  Method used: Electronically to [facility] [address] Shipping/mailing, [place], [place]  [address], Ph: [phone], Fax: [phone] Rx of DILTIAZEM HCL ER BEADS 360 MG  CP24 (DILTIAZEM HCL ER BEADS) one tab by mouth once daily;  #90 Each x 0;  Signed;  Entered by: Everitt Amber;  Authorized by: Syliva Overman MD;  Method used: Electronically to [facility]*, [address], [address] Shipping/mailing, [place], [place]  [address], Ph: [phone], Fax: [phone] Rx of SIMVASTATIN 80 MG  TABS (SIMVASTATIN) one tab by mouth at bedtime;  #90 x 0;  Signed;  Entered by: Everitt Amber;  Authorized by: Syliva Overman MD;  Method used: Electronically to [facility]*, [address], [address] Shipping/mailing, [place], [place]  [address], Ph: [phone], Fax: [phone] Rx of BENICAR 40 MG  TABS (OLMESARTAN MEDOXOMIL) one tab by mouth once daily;  #90 x 0;  Signed;  Entered by: Everitt Amber;  Authorized by: Syliva Overman MD;  Method used: Electronically to [facility]*, [address] Shipping/mailing, [place], [place]  [address], Ph:  [phone], Fax: [phone] Rx of SYMBICORT 80-4.5 MCG/ACT AERO (BUDESONIDE-FORMOTEROL FUMARATE) two puffs bid;  #3 x 0;  Signed;  Entered by: Everitt Amber;  Authorized by: Syliva Overman MD;  Method used: Electronically to [facility]*, [address], [address] Shipping/mailing, [place], [place]  [address], Ph: [phone], Fax: [phone] Rx of CYCLOBENZAPRINE HCL 10 MG TABS (CYCLOBENZAPRINE HCL) one tab by mouth qhs;  #90 x 0;  Signed;  Entered by: Everitt Amber;  Authorized by: Syliva Overman MD;  Method used: Electronically to [facility]*, [address] Shipping/mailing, [place], [place]  [address], Ph: [phone], Fax: [phone] Rx of FEXOFENADINE HCL 180 MG TABS (FEXOFENADINE HCL) one tab by mouth qd;  #90 x 0;  Signed;  Entered by: Everitt Amber;  Authorized by: Syliva Overman MD;  Method used: Electronically to [facility]*, [address], [address] Shipping/mailing, [place], [place]  [address], Ph: [phone], Fax: [phone] Rx of IBUPROFEN 800 MG TABS (IBUPROFEN) one tab by mouth three times a day as needed only. (60 to last 90 days);  #60 x 0;  Signed;  Entered by: Everitt Amber;  Authorized by: Syliva Overman MD;  Method used: Electronically to [facility]*, [address], [address] Shipping/mailing, [place], [place]  [address], Ph: [phone], Fax: [phone]    Prescriptions: IBUPROFEN 800 MG TABS (IBUPROFEN) one tab by mouth three times a day as  needed only. (60 to last 90 days)  #60 x 0   Entered by:   Everitt Amber   Authorized by:   Syliva Overman MD   Signed by:   Everitt Amber on 12/08/2009   Method used:   Electronically to        [facility] (retail)       [address]       [address] Shipping/mailing       [place], [place]  [address]       Ph: [phone]       Fax: [phone]   RxID:   [id number] FEXOFENADINE HCL 180 MG TABS  (FEXOFENADINE HCL) one tab by mouth qd  #90 x 0   Entered by:   Everitt Amber   Authorized by:   Syliva Overman MD   Signed by:   Everitt Amber on 12/08/2009   Method used:   Electronically to        [facility]* (retail)       [address]       [address] Shipping/mailing       [place], [place]  [address]       Ph: [phone]       Fax: [phone]   RxID:   [id number] CYCLOBENZAPRINE HCL 10 MG TABS (CYCLOBENZAPRINE HCL) one tab by mouth qhs  #90 x 0   Entered by:   Everitt Amber   Authorized by:   Syliva Overman MD   Signed by:   Everitt Amber on 12/08/2009   Method used:   Electronically to        [facility]* (retail)       [address]       [address] Shipping/mailing       [place], [place]  [address]       Ph: [phone]       Fax: [phone]   RxID:   [id number] SYMBICORT 80-4.5 MCG/ACT AERO (BUDESONIDE-FORMOTEROL FUMARATE) two puffs bid  #3 x 0   Entered by:   Everitt Amber   Authorized by:   Syliva Overman MD   Signed by:   Everitt Amber on 12/08/2009   Method used:   Electronically to        [facility]* (retail)       [address]       [address] Shipping/mailing       [place], [place]  [address]       Ph: [phone]       Fax: [phone]   RxID:   [id number] BENICAR 40 MG  TABS (OLMESARTAN MEDOXOMIL) one tab by mouth once daily  #90 x 0   Entered by:   Everitt Amber   Authorized by:   Syliva Overman MD   Signed by:   Everitt Amber on 12/08/2009   Method used:   Electronically to        [facility]* (retail)       [address]       [address] Shipping/mailing       [place], [place]  [address]       Ph: [phone]       Fax: [phone]   RxID:   [id number] SIMVASTATIN 80 MG  TABS (SIMVASTATIN) one tab by mouth at bedtime  #90 x 0   Entered by:   Everitt Amber  Authorized by:   Syliva Overman MD   Signed by:   Everitt Amber on 12/08/2009    Method used:   Electronically to        [facility]* (retail)       [address]       [address] Shipping/mailing       [place], [place]  [address]       Ph: [phone]       Fax: [phone]   RxID:   [id number] DILTIAZEM HCL ER BEADS 360 MG  CP24 (DILTIAZEM HCL ER BEADS) one tab by mouth once daily  #90 Each x 0   Entered by:   Everitt Amber   Authorized by:   Syliva Overman MD   Signed by:   Everitt Amber on 12/08/2009   Method used:   Electronically to        [facility]* (retail)       [address]       [address] Shipping/mailing       [place], [place]  [address]       Ph: [phone]       Fax: [phone]   RxID:   [id number] LASIX 20 MG  TABS (FUROSEMIDE) one tab by mouth once daily  #90 Each x 0   Entered by:   Everitt Amber   Authorized by:   Syliva Overman MD   Signed by:   Everitt Amber on 12/08/2009   Method used:   Electronically to        [facility]* (retail)       [address]       [address] Shipping/mailing       [place], [place]  [address]       Ph: [phone]       Fax: [phone]   RxID:   [id number] KLOR-CON M10 10 MEQ  TBCR (POTASSIUM CHLORIDE CRYS CR) two tabs by mouth once daily  #180 x 0   Entered by:   Everitt Amber   Authorized by:   Syliva Overman MD   Signed by:   Everitt Amber on 12/08/2009   Method used:   Electronically to        [facility]* (retail)       [address]       [address] Shipping/mailing       [place], [place]  [address]       Ph: [phone]       Fax: [phone]   RxID:   [id number]

## 2010-12-13 NOTE — Progress Notes (Signed)
Summary: samples  Phone Note Call from Patient   Summary of Call: call her at [phone] samples of benicar Initial call taken by: Lind Guest,  September 02, 2008 8:21 AM  Follow-up for Phone Call        Coming to get Benicar 40mg  samples today Follow-up by: Everitt Amber,  September 04, 2008 11:08 AM

## 2010-12-13 NOTE — Progress Notes (Signed)
Summary: CALL  Phone Note Call from Patient   Summary of Call: NEEDS FOR YOU TO CALL HER AT [phone] OR CELL # [phone] Initial call taken by: Lind Guest,  July 28, 2010 2:44 PM    Prescriptions: CRESTOR 20 MG TABS (ROSUVASTATIN CALCIUM) Take 1 tab by mouth at bedtime  #90 x 0   Entered by:   Everitt Amber LPN   Authorized by:   Syliva Overman MD   Signed by:   Everitt Amber LPN on 13/06/6577   Method used:   Printed then faxed to ...       [facility]* (retail)       [address]       [address] Shipping/mailing       [place], [place]  [address]       Ph: [phone]       Fax: [phone]   RxID:   [id number] CRESTOR 20 MG TABS (ROSUVASTATIN CALCIUM) Take 1 tab by mouth at bedtime  #90 x 0   Entered by:   Everitt Amber LPN   Authorized by:   Syliva Overman MD   Signed by:   Everitt Amber LPN on 64/33/2951   Method used:   Electronically to        [facility]* (retail)       [address]       [address] Shipping/mailing       [place], [place]  [address]       Ph: [phone]       Fax: [phone]   RxID:   [id number] KLOR-CON M10 10 MEQ  TBCR (POTASSIUM CHLORIDE CRYS CR) two tabs by mouth once daily  #180 x 0   Entered by:   Everitt Amber LPN   Authorized by:   Syliva Overman MD   Signed by:   Everitt Amber LPN on 51/76/1607   Method used:   Electronically to        [facility]* (retail)       [address]       [address] Shipping/mailing       [place], [place]  [address]       Ph: [phone]       Fax: [phone]   RxID:   [id number]

## 2010-12-13 NOTE — Progress Notes (Signed)
Summary: med   Phone Note Call from Patient   Summary of Call: needs to get a rx for simastatin. wants to get free [phone] call in to [facility] Initial call taken by: Rudene Anda,  May 09, 2010 11:43 AM  Follow-up for Phone Call        this is not currently on med list, do you want to change her to this? Follow-up by: Adella Hare LPN,  May 09, 2010 2:32 PM  Additional Follow-up for Phone Call Additional follow up Details #1::        advise there isa wariniong with simvastatin and her cardizem i advise her to stay on the crestor, 90 day supply ijust saw is about $17 thru [facility] that she is on Additional Follow-up by: Syliva Overman MD,  May 09, 2010 4:03 PM    Additional Follow-up for Phone Call Additional follow up Details #2::    patient aware Follow-up by: Adella Hare LPN,  May 09, 2010 4:53 PM  New/Updated Medications: CRESTOR 20 MG TABS (ROSUVASTATIN CALCIUM) Take 1 tab by mouth at bedtime Prescriptions: CRESTOR 20 MG TABS (ROSUVASTATIN CALCIUM) Take 1 tab by mouth at bedtime  #90 x 0   Entered and Authorized by:   Syliva Overman MD   Signed by:   Syliva Overman MD on 05/09/2010   Method used:   Electronically to        [facility]* (retail)       [address]       [address] Shipping/mailing       [place], [place]  [address]       Ph: [phone]       Fax: [phone]   RxID:   [id number]

## 2010-12-13 NOTE — Progress Notes (Signed)
Summary: DR. Lucianne Muss  DR. Lucianne Muss   Imported By: Lind Guest 12/08/2009 15:01:13  _____________________________________________________________________  External Attachment:    Type:   Image     Comment:   External Document

## 2010-12-13 NOTE — Assessment & Plan Note (Signed)
Summary: OV   Vital Signs:  Patient Profile:   50 Years Old Female Height:     65 inches Weight:      282 pounds BMI:     47.10 O2 Sat:      96 % Pulse rate:   106 / minute Resp:     16 per minute BP sitting:   120 / 60  (left arm)  Vitals Entered By: Everitt Amber (October 29, 2008 3:42 PM)                 Chief Complaint:  Follow up, sinus drainage, and still bleeding with the implanon birth control.  History of Present Illness: Dry hacking cough and wheezing since past 6 days. No yellow nasal drainage now , she did have some last weekend . She has been experiencing a wheezing in her chest worse when she lies down and actually gets some relief from breathing tereatments.  She continues to bleed constantly and has an upcoming appt with gynae. She is inconsistent with her eating and exercise and her blood sugars average 150 to 160 pounds fasting and hr weight is at its maximum.    Updated Prior Medication List: ASPIRIN 81 MG  TBEC (ASPIRIN) one tab by mouth once daily METANX 2.8-25-2 MG  TABS (L-METHYLFOLATE-B6-B12) one tab by mouth once daily METFORMIN HCL 1000 MG  TABS (METFORMIN HCL) one tab by mouth two times a day KLOR-CON M10 10 MEQ  TBCR (POTASSIUM CHLORIDE CRYS CR) two tabs by mouth once daily LASIX 20 MG  TABS (FUROSEMIDE) one tab by mouth once daily GLIPIZIDE XL 10 MG  TB24 (GLIPIZIDE) one tab by mouth two times a day LANTUS 100 UNIT/ML  SOLN (INSULIN GLARGINE) inject 100 units once daily BYETTA 10 MCG PEN 10 MCG/0.04ML  SOLN (EXENATIDE) inject once twice a day DILTIAZEM HCL ER BEADS 360 MG  CP24 (DILTIAZEM HCL ER BEADS) one tab by mouth once daily SIMVASTATIN 80 MG  TABS (SIMVASTATIN) one tab by mouth at bedtime BENICAR 40 MG  TABS (OLMESARTAN MEDOXOMIL) one tab by mouth once daily DILTIAZEM HCL ER BEADS 180 MG XR24H-CAP (DILTIAZEM HCL ER BEADS) Take 1 tablet by mouth two times a day DELSYM 30 MG/5ML LQCR (DEXTROMETHORPHAN POLISTIREX) as needed  Current  Allergies: No known allergies      Review of Systems  General      Denies chills, fatigue, fever, loss of appetite, malaise, sleep disorder, sweats, weakness, and weight loss.  ENT      Denies earache, hoarseness, nasal congestion, sinus pressure, and sore throat.  CV      Denies chest pain or discomfort, palpitations, shortness of breath with exertion, and swelling of feet.  Resp      Complains of cough, shortness of breath, and wheezing.      Denies sputum productive.  GI      Denies abdominal pain, constipation, nausea, and vomiting.  GU      Complains of abnormal vaginal bleeding.      Denies dysuria and urinary frequency.  MS      Denies joint pain and stiffness.  Derm      Complains of lesion(s).      fungal rash between 4th and 5th toes both feet.  Psych      Denies anxiety and depression.  Endo      Complains of weight change.  Allergy      Complains of seasonal allergies.   Physical Exam  General:  alert and well-hydrated.  morbidly obese Head:     Normocephalic and atraumatic without obvious abnormalities. No apparent alopecia or balding. Eyes:     vision grossly intact.   Ears:     External ear exam shows no significant lesions or deformities.  Otoscopic examination reveals clear canals, tympanic membranes are intact bilaterally without bulging, retraction, inflammation or discharge. Hearing is grossly normal bilaterally. Nose:     External nasal examination shows no deformity or inflammation. Nasal mucosa are pink and moist without lesions or exudates. Mouth:     Oral mucosa and oropharynx without lesions or exudates.  Teeth in good repair. Neck:     No deformities, masses, or tenderness noted. Lungs:     Normal respiratory effort, chest expands symmetrically. Lungs are clear to auscultation, no crackles or wheezes. Heart:     Normal rate and regular rhythm. S1 and S2 normal without gallop, murmur, click, rub or other extra  sounds. Abdomen:     Bowel sounds positive,abdomen soft and non-tender without masses, organomegaly or hernias noted. Extremities:     No clubbing, cyanosis, edema, or deformity noted with normal full range of motion of all joints.   Skin:     tinea pedis otherwise nl Cervical Nodes:     No lymphadenopathy noted Psych:     Cognition and judgment appear intact. Alert and cooperative with normal attention span and concentration. No apparent delusions, illusions, hallucinations  Diabetes Management Exam:    Foot Exam (with socks and/or shoes not present):       Sensory-Pinprick/Light touch:          Left medial foot (L-4): normal          Left dorsal foot (L-5): normal          Left lateral foot (S-1): normal          Right medial foot (L-4): normal          Right dorsal foot (L-5): normal          Right lateral foot (S-1): normal       Inspection:          Left foot: abnormal             Comments: fungal infection between 4th and 5thtoe          Right foot: abnormal             Comments: fungal infection between 4th and 5th toe       Nails:          Left foot: normal          Right foot: normal    Impression & Recommendations:  Problem # 1:  HYPERTENSION (ICD-401.9) Assessment: Improved  Her updated medication list for this problem includes:    Lasix 20 Mg Tabs (Furosemide) ..... One tab by mouth once daily    Diltiazem Hcl Er Beads 360 Mg Cp24 (Diltiazem hcl er beads) ..... One tab by mouth once daily    Benicar 40 Mg Tabs (Olmesartan medoxomil) ..... One tab by mouth once daily    Diltiazem Hcl Er Beads 180 Mg Xr24h-cap (Diltiazem hcl er beads) .Marland Kitchen... Take 1 tablet by mouth two times a day  Orders: T-Basic Metabolic Panel [id number])  BP today: 120/60 Prior BP: 140/78 (10/26/2008)  Labs Reviewed: Creat: 0.79 (05/18/2008) Chol: 159 (05/18/2008)   HDL: 52 (05/18/2008)   LDL: 96 (05/18/2008)   TG: 56 (05/18/2008)   Problem # 2:  IDDM (ICD-250.01)  Assessment:  Deteriorated  Her updated medication list for this problem includes:    Aspirin 81 Mg Tbec (Aspirin) ..... One tab by mouth once daily    Metformin Hcl 1000 Mg Tabs (Metformin hcl) ..... One tab by mouth two times a day    Glipizide Xl 10 Mg Tb24 (Glipizide) ..... One tab by mouth two times a day    Lantus 100 Unit/ml Soln (Insulin glargine) ..... Inject 100 units once daily    Byetta 10 Mcg Pen 10 Mcg/0.56ml Soln (Exenatide) ..... Inject once twice a day    Benicar 40 Mg Tabs (Olmesartan medoxomil) ..... One tab by mouth once daily  Orders: Glucose, (CBG) (829[address]) Hemoglobin A1C (83036)  Labs Reviewed: HgBA1c: 9.0 (10/29/2008)   Creat: 0.79 (05/18/2008)   Microalbumin: 7.50 (07/18/2007)Pt to be diligent with diet and exercise, if in 3 weeks no improvement in her blood sugars then she will add an insulin mix   Problem # 3:  OBESITY, UNSPECIFIED (ICD-278.00) Assessment: Deteriorated  Problem # 4:  FIBROIDS, UTERUS (ICD-218.9) Assessment: Comment Only pt continues to bleed excessively and has an upcoming re-eval with her gynaecologist  Problem # 5:  TINEA PEDIS (ICD-110.4) Assessment: Comment Only pt advised to use oTC antifungal foot powder  daily until resolved and to start using a cornstarch based (drying powder) daily and diabetic socks which will absorb the sweat more   Complete Medication List: 1)  Aspirin 81 Mg Tbec (Aspirin) .... One tab by mouth once daily 2)  Metanx 2.8-25-2 Mg Tabs (L-methylfolate-b6-b12) .... One tab by mouth once daily 3)  Metformin Hcl 1000 Mg Tabs (Metformin hcl) .... One tab by mouth two times a day 4)  Klor-con M10 10 Meq Tbcr (Potassium chloride crys cr) .... Two tabs by mouth once daily 5)  Lasix 20 Mg Tabs (Furosemide) .... One tab by mouth once daily 6)  Glipizide Xl 10 Mg Tb24 (Glipizide) .... One tab by mouth two times a day 7)  Lantus 100 Unit/ml Soln (Insulin glargine) .... Inject 100 units once daily 8)  Byetta 10 Mcg Pen 10 Mcg/0.69ml Soln  (Exenatide) .... Inject once twice a day 9)  Diltiazem Hcl Er Beads 360 Mg Cp24 (Diltiazem hcl er beads) .... One tab by mouth once daily 10)  Simvastatin 80 Mg Tabs (Simvastatin) .... One tab by mouth at bedtime 11)  Benicar 40 Mg Tabs (Olmesartan medoxomil) .... One tab by mouth once daily 12)  Diltiazem Hcl Er Beads 180 Mg Xr24h-cap (Diltiazem hcl er beads) .... Take 1 tablet by mouth two times a day 13)  Delsym 30 Mg/55ml Lqcr (Dextromethorphan polistirex) .... As needed  Other Orders: T-Hepatic Function [phone]) T-Lipid Profile [id number])   Patient Instructions: 1)  Please schedule a follow-up appointment in 3 months. 2)  It is important that you exercise regularly at least 20 minutes 5 times a week. If you develop chest pain, have severe difficulty breathing, or feel very tired , stop exercising immediately and seek medical attention. 3)  You need to lose weight. Consider a lower calorie diet and regular exercise.  4)  Weight loss goal of at least 6 pounds. 5)  BMP prior to visit, ICD-9: 6)  Hepatic Panel prior to visit, ICD-9:..in 3 months 7)  Lipid Panel prior to visit, ICD-9:   Prescriptions: METANX 2.8-25-2 MG  TABS (L-METHYLFOLATE-B6-B12) one tab by mouth once daily  #30 x 5   Entered by:   Worthy Keeler LPN   Authorized by:   Syliva Overman MD  Signed by:   Worthy Keeler LPN on 16/08/9603   Method used:   Electronically to        [facility]  [address] (retail)       [address]       [place], [place]  [address]       Ph: [phone]       Fax: [phone]   RxID:   [id number] BENICAR 40 MG  TABS (OLMESARTAN MEDOXOMIL) one tab by mouth once daily  #56 x 0   Entered by:   Worthy Keeler LPN   Authorized by:   Syliva Overman MD   Signed by:   Worthy Keeler LPN on 09/09/2535   Method used:   Electronically to        [facility]  [address] (retail)       [address]       [place], [place]  [address]       Ph:  [phone]       Fax: [phone]   RxID:   [id number]  ] Laboratory Results   Blood Tests   Date/Time Received: October 29, 2008 4pm Date/Time Reported: October 29, 2008 4pm  Glucose (random): 136 mg/dL   (Normal Range: 32-355) HGBA1C: 9.0%   (Normal Range: Non-Diabetic - 3-6%   Control Diabetic - 6-8%)

## 2010-12-13 NOTE — Progress Notes (Signed)
Summary: Wants flexeril called in  Phone Note Call from Patient   Caller: Patient Summary of Call: Pt states she moved some furniture and now has back pain. Wants Flexeril called in. Advised practice policy is not to write scripts after hours. Advised to start NSAID such as Ibuprofen 200 mg two to three tabs three times a day for two days with food. If pain still persists, see Dr. Lodema Hong on Monday or go to ED. Agrees. Initial call taken by: Franchot Heidelberg MD,  May 22, 2009 3:27 PM

## 2010-12-13 NOTE — Progress Notes (Signed)
Summary: refills  Phone Note Call from Patient   Summary of Call: needs a refill on flexril. pharm may have already faxed. [phone] Initial call taken by: Rudene Anda,  May 24, 2009 10:53 AM  Follow-up for Phone Call        Rx Called In Follow-up by: Worthy Keeler LPN,  May 24, 2009 10:58 AM

## 2010-12-13 NOTE — Miscellaneous (Signed)
Summary: strips

## 2010-12-13 NOTE — Letter (Signed)
Summary: LINK TO [facility]  LINK TO [facility]   Imported By: Lind Guest 01/17/2010 14:24:17  _____________________________________________________________________  External Attachment:    Type:   Image     Comment:   External Document

## 2010-12-13 NOTE — Progress Notes (Signed)
Summary: REFILL  Phone Note Call from Patient   Summary of Call: NEEDS A REFILL ON BENICAR AND ALSO MAYBE SAMPLES. [facility]   [phone] Initial call taken by: Rudene Anda,  April 23, 2009 9:08 AM  Follow-up for Phone Call        Rx Called In Follow-up by: Worthy Keeler LPN,  April 23, 2009 9:24 AM      Prescriptions: BENICAR 40 MG  TABS (OLMESARTAN MEDOXOMIL) one tab by mouth once daily  #30 x 2   Entered by:   Worthy Keeler LPN   Authorized by:   Syliva Overman MD   Signed by:   Worthy Keeler LPN on 08/65/7846   Method used:   Electronically to        [facility]  [address] (retail)       [address]       [place], [place]  [address]       Ph: [phone]       Fax: [phone]   RxID:   [id number]

## 2010-12-13 NOTE — Assessment & Plan Note (Signed)
Summary: FOLLOW UP   Vital Signs:  Patient profile:   50 year old female Menstrual status:  irregular Height:      65 inches Weight:      287 pounds BMI:     47.93 O2 Sat:      96 % Pulse rate:   96 / minute Pulse rhythm:   regular Resp:     16 per minute BP sitting:   120 / 74  (right arm) Cuff size:   xl  Vitals Entered By: Everitt Amber LPN (December 29, 2009 4:25 PM)  Nutrition Counseling: Patient's BMI is greater than 25 and therefore counseled on weight management options. CC: Follow up chronic problems Is Patient Diabetic? Yes   CC:  Follow up chronic problems.  History of Present Illness: Reports  that she has been  doing well. Denies recent fever or chills. Denies sinus pressure, nasal congestion , ear pain or sore throat. Denies chest congestion, or cough productive of sputum. Denies chest pain, palpitations, PND,or  orthopnea, does note mild leg edema at the end of the day. Denies abdominal pain, nausea, vomitting, diarrhea or constipation. Denies change in bowel movements or bloody stool. Denies dysuria , frequency, incontinence or hesitancy. Denies  joint pain, swelling, or reduced mobility.Still has some left foot pain, she did see podiatry for this and got some relief Denies headaches, vertigo, seizures. Denies depression, anxiety or insomnia. Denies  rash, lesions, or itch.     Current Medications (verified): 1)  Aspirin 81 Mg  Tbec (Aspirin) .... One Tab By Mouth Once Daily 2)  Metformin Hcl 1000 Mg  Tabs (Metformin Hcl) .... One Tab By Mouth Two Times A Day 3)  Klor-Con M10 10 Meq  Tbcr (Potassium Chloride Crys Cr) .... Two Tabs By Mouth Once Daily 4)  Lasix 20 Mg  Tabs (Furosemide) .... One Tab By Mouth Once Daily 5)  Lantus 100 Unit/ml  Soln (Insulin Glargine) .... Inject 65 Units Once Daily 6)  Diltiazem Hcl Er Beads 360 Mg  Cp24 (Diltiazem Hcl Er Beads) .... One Tab By Mouth Once Daily 7)  Benicar 40 Mg  Tabs (Olmesartan Medoxomil) .... One Tab By  Mouth Once Daily 8)  Delsym 30 Mg/30ml Lqcr (Dextromethorphan Polistirex) .... As Needed 9)  Symbicort 80-4.5 Mcg/act Aero (Budesonide-Formoterol Fumarate) .... Two Puffs Bid 10)  Cyclobenzaprine Hcl 10 Mg Tabs (Cyclobenzaprine Hcl) .... One Tab By Mouth Qhs 11)  Fexofenadine Hcl 180 Mg Tabs (Fexofenadine Hcl) .... One Tab By Mouth Qd 12)  Accu-Chek Compact  Strp (Glucose Blood) .... Four Times Daily Testing 13)  Ibuprofen 800 Mg Tabs (Ibuprofen) .... One Tab By Mouth Three Times A Day As Needed Only. (60 To Last 90 Days) 14)  Crestor 20 Mg Tabs (Rosuvastatin Calcium) .... One Tab By Mouth At Bedtime  Discontinue Simvastatin 15)  Vitamin D 1000 Unit Tabs (Cholecalciferol) .... Take 1 Tablet By Mouth Once A Day  Allergies (verified): No Known Drug Allergies  Review of Systems      See HPI Eyes:  Denies blurring and discharge. GU:  Denies dysuria and urinary frequency. Endo:  Denies cold intolerance, excessive hunger, excessive thirst, excessive urination, heat intolerance, polyuria, and weight change; rewports dietary indiscretion and has gained weight with no iomprovement in her blood sugar in the last 3 months, plans top reverse this.   Impression & Recommendations:  Problem # 1:  HEEL PAIN, LEFT (ICD-729.5) Assessment Improved  Problem # 2:  OBESITY, UNSPECIFIED (ICD-278.00) Assessment: Deteriorated  Ht:  65 (12/29/2009)   Wt: 287 (12/29/2009)   BMI: 47.93 (12/29/2009)  Problem # 3:  HYPERTENSION (ICD-401.9) Assessment: Unchanged  Her updated medication list for this problem includes:    Lasix 20 Mg Tabs (Furosemide) ..... One tab by mouth once daily    Diltiazem Hcl Er Beads 360 Mg Cp24 (Diltiazem hcl er beads) ..... One tab by mouth once daily    Benicar 40 Mg Tabs (Olmesartan medoxomil) ..... One tab by mouth once daily  BP today: 120/74 Prior BP: 122/70 (07/22/2009)  Labs Reviewed: K+: 4.0 (01/20/2009) Creat: : 0.69 (01/20/2009)   Chol: 161 (01/20/2009)   HDL: 53  (01/20/2009)   LDL: 96 (01/20/2009)   TG: 58 (01/20/2009)  Problem # 4:  IDDM (ICD-250.01) Assessment: Deteriorated  Her updated medication list for this problem includes:    Aspirin 81 Mg Tbec (Aspirin) ..... One tab by mouth once daily    Metformin Hcl 1000 Mg Tabs (Metformin hcl) ..... One tab by mouth two times a day    Lantus 100 Unit/ml Soln (Insulin glargine) ..... Inject 65 units once daily    Benicar 40 Mg Tabs (Olmesartan medoxomil) ..... One tab by mouth once daily  Orders: Glucose, (CBG) [phone])  Labs Reviewed: Creat: 0.69 (01/20/2009)    Reviewed HgBA1c results: 7.0 (07/22/2009), mopst recent was 7.8  9.4 (01/28/2009)  Complete Medication List: 1)  Aspirin 81 Mg Tbec (Aspirin) .... One tab by mouth once daily 2)  Metformin Hcl 1000 Mg Tabs (Metformin hcl) .... One tab by mouth two times a day 3)  Klor-con M10 10 Meq Tbcr (Potassium chloride crys cr) .... Two tabs by mouth once daily 4)  Lasix 20 Mg Tabs (Furosemide) .... One tab by mouth once daily 5)  Lantus 100 Unit/ml Soln (Insulin glargine) .... Inject 65 units once daily 6)  Diltiazem Hcl Er Beads 360 Mg Cp24 (Diltiazem hcl er beads) .... One tab by mouth once daily 7)  Benicar 40 Mg Tabs (Olmesartan medoxomil) .... One tab by mouth once daily 8)  Delsym 30 Mg/31ml Lqcr (Dextromethorphan polistirex) .... As needed 9)  Symbicort 80-4.5 Mcg/act Aero (Budesonide-formoterol fumarate) .... Two puffs bid 10)  Cyclobenzaprine Hcl 10 Mg Tabs (Cyclobenzaprine hcl) .... One tab by mouth qhs 11)  Fexofenadine Hcl 180 Mg Tabs (Fexofenadine hcl) .... One tab by mouth qd 12)  Accu-chek Compact Strp (Glucose blood) .... Four times daily testing 13)  Ibuprofen 800 Mg Tabs (Ibuprofen) .... One tab by mouth three times a day as needed only. (60 to last 90 days) 14)  Crestor 20 Mg Tabs (Rosuvastatin calcium) .... One tab by mouth at bedtime  discontinue simvastatin 15)  Vitamin D 1000 Unit Tabs (Cholecalciferol) .... Take 1 tablet by  mouth once a day  Patient Instructions: 1)  Please schedule a follow-up appointment in 5.5 months. 2)  It is important that you exercise regularly at least 30 minutes 5 times a week. If you develop chest pain, have severe difficulty breathing, or feel very tired , stop exercising immediately and seek medical attention. 3)  You need to lose weight. Consider a lower calorie diet and regular exercise.  4)  BMP prior to visit, ICD-9: 5)  Hepatic Panel prior to visit, ICD-9:  fasting in 5.5 months 6)  Lipid Panel prior to visit, ICD-9: 7)  i will contact you about the urine test once I hear from dr. Lucianne Muss. 8)  You need to have a Pap Smear to prevent cervical cancer. Prescriptions: LASIX 20 MG  TABS (FUROSEMIDE) one tab by mouth once daily  #90 Each x 0   Entered by:   Adella Hare LPN   Authorized by:   Syliva Overman MD   Signed by:   Adella Hare LPN on 04/54/0981   Method used:   Electronically to        [facility]* (retail)       [address]       [address] Shipping/mailing       [place], [place]  [address]       Ph: [phone]       Fax: [phone]   RxID:   [id number] KLOR-CON M10 10 MEQ  TBCR (POTASSIUM CHLORIDE CRYS CR) two tabs by mouth once daily  #180 x 0   Entered by:   Adella Hare LPN   Authorized by:   Syliva Overman MD   Signed by:   Adella Hare LPN on 36/64/4034   Method used:   Electronically to        [facility]* (retail)       [address]       [address] Shipping/mailing       [place], [place]  [address]       Ph: [phone]       Fax: [phone]   RxID:   [id number] LASIX 20 MG  TABS (FUROSEMIDE) one tab by mouth once daily  #90 Each x 0   Entered by:   Adella Hare LPN   Authorized by:   Syliva Overman MD   Signed by:   Adella Hare LPN on 32/20/2542   Method used:   Electronically to        [facility]  [address] (retail)       [address]       [place], [place]   [address]       Ph: [phone]       Fax: [phone]   RxID:   [id number] KLOR-CON M10 10 MEQ  TBCR (POTASSIUM CHLORIDE CRYS CR) two tabs by mouth once daily  #180 x 0   Entered by:   Adella Hare LPN   Authorized by:   Syliva Overman MD   Signed by:   Adella Hare LPN on 81/82/9937   Method used:   Electronically to        [facility]  [address] (retail)       [address]       [place], [place]  [address]       Ph: [phone]       Fax: [phone]   RxID:   [id number]   Laboratory Results   Blood Tests     Glucose (random): 149 mg/dL   (Normal Range: 67-619)      Appended Document: FOLLOW UP    Clinical Lists Changes  Orders: Added new Service order of Hemoglobin A1C (50932) - Signed Observations: Added new observation of HGBA1C: 7.4 % (12/30/2009 8:08)      Laboratory Results   Blood Tests     HGBA1C: 7.4%   (Normal Range: Non-Diabetic - 3-6%   Control Diabetic - 6-8%)

## 2010-12-13 NOTE — Assessment & Plan Note (Signed)
Summary: office visit   Vital Signs:  Patient profile:   50 year old female Menstrual status:  irregular LMP:     10/29/2008 Height:      65 inches (165.10 cm) Weight:      285 pounds (129.55 kg) BMI:     47.60 BSA:     2.30 O2 Sat:      98 % Pulse rate:   85 / minute Pulse rhythm:   regular Resp:     16 per minute BP sitting:   138 / 70  Vitals Entered By: Everitt Amber (January 28, 2009 3:39 PM)  Nutrition Counseling: Patient's BMI is greater than 25 and therefore counseled on weight management options. LMP (date): 10/29/2008  years   days  Menstrual Status irregular Enter LMP: 10/29/2008   History of Present Illness: Pt ias tearful and frustrated about continued weight gain and uncontrolled blood sugars. We both agree that an endo eval and assistamnce is in her best interest.  She has had no cycle since Dec and wants to be sure she is not pregnant.  Feels as though there is movement in the lower abdomen, she feels nauseated, started 3 days ago, no loose stool.  Gynae history is positive for fibroids and abn bleeding she has contraception intradermally and is being followed for this.  She does report feeling overwhelmed, tearful, social withdrawal and being depressed.She is not suicidal, homicidal or hallucinating, symptoms have worsened over the past 3 months.       Preventive Screening-Counseling & Management     Smoking Status: never  Allergies (verified): No Known Drug Allergies  Review of Systems General:  Complains of fatigue and malaise; denies chills and fever. Eyes:  Denies blurring, discharge, and double vision. ENT:  Denies earache, hoarseness, nasal congestion, sinus pressure, and sore throat. CV:  Denies chest pain or discomfort, palpitations, and swelling of feet. Resp:  Denies cough, sputum productive, and wheezing. GI:  Denies abdominal pain, constipation, diarrhea, nausea, and vomiting. GU:  See HPI; Complains of abnormal vaginal bleeding; denies  dysuria and urinary frequency. MS:  Denies joint pain and stiffness. Derm:  Denies insect bite(s), lesion(s), and rash. Psych:  Complains of anxiety and easily tearful; denies suicidal thoughts/plans and thoughts of violence; social withdrawal, about 3 months, crying speels, poor sleep, overeating. Endo:  Complains of excessive hunger, excessive thirst, polyuria, and weight change.  Physical Exam  General:  alert, well-hydrated, and overweight-appearing. HEENT: No facial asymmetry,  EOMI, No sinus tenderness, TM's Clear, oropharynx  pink and moist.   Chest: Clear to auscultation bilaterally.  CVS: S1, S2, No murmurs, No S3.   Abd: Soft, Nontender.  MS: Adequate ROM spine, hips, shoulders and knees.  Ext: No edema.   CNS: CN 2-12 intact, power tone and sensation normal throughout.   Skin: Intact, no visible lesions or rashes.  Psych: Good eye contact, tearful and depressed and anxious     Diabetes Management Exam:    Foot Exam (with socks and/or shoes not present):       Sensory-Monofilament:          Left foot: diminished          Right foot: diminished       Inspection:          Left foot: normal          Right foot: normal       Nails:          Left foot: normal  Right foot: normal   Impression & Recommendations:  Problem # 1:  DEPRESSION (ICD-311) Assessment Comment Only  Her updated medication list for this problem includes:    Fluoxetine Hcl 10 Mg Tabs (Fluoxetine hcl) .Marland Kitchen... Take 1 tablet by mouth once a day  Problem # 2:  AMENORRHEA (ICD-626.0) Assessment: Comment Only  Orders: Urine Pregnancy Test  (27062), negative pt reassured  Problem # 3:  TINEA PEDIS (ICD-110.4) Assessment: Comment Only pt to usetopical antifungal  Problem # 4:  HYPERLIPIDEMIA (ICD-272.4) Assessment: Comment Only  Her updated medication list for this problem includes:    Simvastatin 80 Mg Tabs (Simvastatin) ..... One tab by mouth at bedtime  Labs Reviewed: SGOT: 10  (01/20/2009)   SGPT: 11 (01/20/2009)   HDL:53 (01/20/2009), 52 (05/18/2008)  LDL:96 (01/20/2009), 96 (05/18/2008)  Chol:161 (01/20/2009), 159 (05/18/2008)  Trig:58 (01/20/2009), 56 (05/18/2008)  Problem # 5:  OBESITY, UNSPECIFIED (ICD-278.00) Assessment: Deteriorated  Problem # 6:  HYPERTENSION (ICD-401.9) Assessment: Deteriorated  The following medications were removed from the medication list:    Diltiazem Hcl Er Beads 180 Mg Xr24h-cap (Diltiazem hcl er beads) .Marland Kitchen... Take 1 tablet by mouth two times a day Her updated medication list for this problem includes:    Lasix 20 Mg Tabs (Furosemide) ..... One tab by mouth once daily    Diltiazem Hcl Er Beads 360 Mg Cp24 (Diltiazem hcl er beads) ..... One tab by mouth once daily    Benicar 40 Mg Tabs (Olmesartan medoxomil) ..... One tab by mouth once daily  BP today: 138/70 Prior BP: 120/60 (10/29/2008)  Labs Reviewed: K+: 4.0 (01/20/2009) Creat: : 0.69 (01/20/2009)   Chol: 161 (01/20/2009)   HDL: 53 (01/20/2009)   LDL: 96 (01/20/2009)   TG: 58 (01/20/2009)  Problem # 7:  IDDM (ICD-250.01) Assessment: Deteriorated  Her updated medication list for this problem includes:    Aspirin 81 Mg Tbec (Aspirin) ..... One tab by mouth once daily    Metformin Hcl 1000 Mg Tabs (Metformin hcl) ..... One tab by mouth two times a day    Glipizide Xl 10 Mg Tb24 (Glipizide) ..... One tab by mouth two times a day    Lantus 100 Unit/ml Soln (Insulin glargine) ..... Inject 100 units once daily    Byetta 10 Mcg Pen 10 Mcg/0.29ml Soln (Exenatide) ..... Inject once twice a day    Benicar 40 Mg Tabs (Olmesartan medoxomil) ..... One tab by mouth once daily  Orders: T-Urine Microalbumin w/creat. ratio [phone] / [id number]) Glucose, (CBG) (82962) Hemoglobin A1C (83036)9.4 Endocrinology Referral (Endocrine)  Labs Reviewed: Creat: 0.69 (01/20/2009)    Reviewed HgBA1c results: 9.4 (01/28/2009)  9.0 (10/29/2008)  Complete Medication List: 1)  Aspirin 81 Mg Tbec  (Aspirin) .... One tab by mouth once daily 2)  Metformin Hcl 1000 Mg Tabs (Metformin hcl) .... One tab by mouth two times a day 3)  Klor-con M10 10 Meq Tbcr (Potassium chloride crys cr) .... Two tabs by mouth once daily 4)  Lasix 20 Mg Tabs (Furosemide) .... One tab by mouth once daily 5)  Glipizide Xl 10 Mg Tb24 (Glipizide) .... One tab by mouth two times a day 6)  Lantus 100 Unit/ml Soln (Insulin glargine) .... Inject 100 units once daily 7)  Byetta 10 Mcg Pen 10 Mcg/0.20ml Soln (Exenatide) .... Inject once twice a day 8)  Diltiazem Hcl Er Beads 360 Mg Cp24 (Diltiazem hcl er beads) .... One tab by mouth once daily 9)  Simvastatin 80 Mg Tabs (Simvastatin) .... One tab by mouth  at bedtime 10)  Benicar 40 Mg Tabs (Olmesartan medoxomil) .... One tab by mouth once daily 11)  Delsym 30 Mg/65ml Lqcr (Dextromethorphan polistirex) .... As needed 12)  Symbicort 80-4.5 Mcg/act Aero (Budesonide-formoterol fumarate) .... Two puffs bid 13)  Cyclobenzaprine Hcl 10 Mg Tabs (Cyclobenzaprine hcl) .... One tab by mouth qhs 14)  Fluoxetine Hcl 10 Mg Tabs (Fluoxetine hcl) .... Take 1 tablet by mouth once a day  Other Orders: T-CBC w/Diff ([id number]) T-TSH ([phone]) T- * Misc. Laboratory test [phone])  Patient Instructions: 1)  Please schedule a follow-up appointment in 2 months. 2)  Everything except your blood sugar is good. 3)  New med  as discussed. 4)  You will be referred to  a diabetes specialist as soon as you give [place] the name. 5)  CBC , anemia panel and TSH today. 6)  It is important that you exercise regularly at least 40 minutes 6 times a week. If you develop chest pain, have severe difficulty breathing, or feel very tired , stop exercising immediately and seek medical attention. 7)  PLS stop sweet drinks and sugary food, learn to enjoy fruit and water or crystal lught. Prescriptions: FLUOXETINE HCL 10 MG TABS (FLUOXETINE HCL) Take 1 tablet by mouth once a day  #30 x 2   Entered and  Authorized by:   Syliva Overman MD   Signed by:   Syliva Overman MD on 01/28/2009   Method used:   Electronically to        [facility] [address] [facility] (retail)       [address]       [place], [place]  [address]       Ph: [phone]       Fax: [phone]   RxID:   [id number]   Laboratory Results   Urine Tests  Date/Time Received: 01/28/09 4:30pm Date/Time Reported: 01/28/09 4:30pm    Urine HCG: negative  Blood Tests     Glucose (random): 116 mg/dL   (Normal Range: 59-563) HGBA1C: 9.4%   (Normal Range: Non-Diabetic - 3-6%   Control Diabetic - 6-8%)      Laboratory Results   Urine Tests      Urine HCG: negative  Blood Tests     Glucose (random): 116 mg/dL   (Normal Range: 87-564) HGBA1C: 9.4%   (Normal Range: Non-Diabetic - 3-6%   Control Diabetic - 6-8%)     Appended Document: office visit pls can you let me know what thehold up is with this referral, I thought it was done last Friday by lUann, pls check this, also fax over my last oV if they want it andcertainly her labs to include theHBA1C done in the office  Appended Document: office visit pt has appt with endocrinology for 03/01/2009 10:30 and pt aware of appt. luann scheduled.

## 2010-12-13 NOTE — Miscellaneous (Signed)
Summary: refill  Clinical Lists Changes  Medications: Added new medication of FEXOFENADINE HCL 180 MG TABS (FEXOFENADINE HCL) one tab by mouth qd - Signed Added new medication of LANTUS SOLOSTAR 100 UNIT/ML SOLN (INSULIN GLARGINE) inject 100 units subcutaneously every evening - Signed Rx of FEXOFENADINE HCL 180 MG TABS (FEXOFENADINE HCL) one tab by mouth qd;  #30 x 1;  Signed;  Entered by: Worthy Keeler LPN;  Authorized by: Syliva Overman MD;  Method used: Electronically to [facility] [address], [place], [place], [place]  [address], Ph: [phone], Fax: [phone] Rx of LANTUS SOLOSTAR 100 UNIT/ML SOLN (INSULIN GLARGINE) inject 100 units subcutaneously every evening;  #90 x 1;  Signed;  Entered by: Worthy Keeler LPN;  Authorized by: Syliva Overman MD;  Method used: Electronically to [facility] [address], [place], [place], [place]  [address], Ph: [phone], Fax: [phone]    Prescriptions: LANTUS SOLOSTAR 100 UNIT/ML SOLN (INSULIN GLARGINE) inject 100 units subcutaneously every evening  #90 x 1   Entered by:   Worthy Keeler LPN   Authorized by:   Syliva Overman MD   Signed by:   Worthy Keeler LPN on 35/57/3220   Method used:   Electronically to        [facility]  [address] [address] (retail)       [address]       [place], [place]  [address]       Ph: [phone]       Fax: [phone]   RxID:   [id number] FEXOFENADINE HCL 180 MG TABS (FEXOFENADINE HCL) one tab by mouth qd  #30 x 1   Entered by:   Worthy Keeler LPN   Authorized by:   Syliva Overman MD   Signed by:   Worthy Keeler LPN on 00/93/8182   Method used:   Electronically to        [facility]  [address] (retail)       [address]       [place], [place]  [address]       Ph: [phone]       Fax: [phone]   RxID:   [id number]

## 2010-12-13 NOTE — Progress Notes (Signed)
Summary: DR. Lucianne Muss  DR. Lucianne Muss   Imported By: Lind Guest 03/02/2009 16:38:42  _____________________________________________________________________  External Attachment:    Type:   Image     Comment:   External Document

## 2010-12-13 NOTE — Letter (Signed)
Summary: MEDICAL  RELEASE  MEDICAL  RELEASE   Imported By: Lind Guest 03/04/2009 16:30:48  _____________________________________________________________________  External Attachment:    Type:   Image     Comment:   External Document

## 2010-12-13 NOTE — Assessment & Plan Note (Signed)
Summary: OV   Vital Signs:  Patient profile:   50 year old female Menstrual status:  irregular Height:      65 inches Weight:      282.50 pounds BMI:     47.18 O2 Sat:      96 % on Room air Pulse rate:   96 / minute Pulse rhythm:   regular Resp:     16 per minute BP sitting:   118 / 74  (left arm)  Nutrition Counseling: Patient's BMI is greater than 25 and therefore counseled on weight management options.  O2 Flow:  Room air CC: Follow up   CC:  Follow up.  History of Present Illness: Reports  that she has been doing well Denies recent fever or chills. Denies sinus pressure, nasal congestion , ear pain or sore throat. Denies chest congestion, or cough productive of sputum. Denies chest pain, palpitations, PND, orthopnea or leg swelling. Denies abdominal pain, nausea, vomitting, diarrhea or constipation. Denies change in bowel movements or bloody stool. Denies dysuria , frequency, incontinence or hesitancy. Denies  joint pain, swelling, or reduced mobility. Denies headaches, vertigo, seizures. Denies depression, anxiety or insomnia. Denies  rash, lesions, or itch.     Current Medications (verified): 1)  Aspirin 81 Mg  Tbec (Aspirin) .... One Tab By Mouth Once Daily 2)  Metformin Hcl 1000 Mg  Tabs (Metformin Hcl) .... One Tab By Mouth Two Times A Day 3)  Klor-Con M10 10 Meq  Tbcr (Potassium Chloride Crys Cr) .... Two Tabs By Mouth Once Daily 4)  Lasix 20 Mg  Tabs (Furosemide) .... One Tab By Mouth Once Daily 5)  Lantus 100 Unit/ml  Soln (Insulin Glargine) .... Inject 65 Units Once Daily 6)  Diltiazem Hcl Er Beads 360 Mg  Cp24 (Diltiazem Hcl Er Beads) .... One Tab By Mouth Once Daily 7)  Benicar 40 Mg  Tabs (Olmesartan Medoxomil) .... One Tab By Mouth Once Daily 8)  Delsym 30 Mg/31ml Lqcr (Dextromethorphan Polistirex) .... As Needed 9)  Fexofenadine Hcl 180 Mg Tabs (Fexofenadine Hcl) .... One Tab By Mouth Qd 10)  Accu-Chek Compact  Strp (Glucose Blood) .... Four Times  Daily Testing 11)  Ibuprofen 800 Mg Tabs (Ibuprofen) .... One Tab By Mouth Three Times A Day As Needed Only. (60 To Last 90 Days) 12)  Vitamin D 1000 Unit Tabs (Cholecalciferol) .... Take 1 Tablet By Mouth Once A Day 13)  Crestor 20 Mg Tabs (Rosuvastatin Calcium) .... Take 1 Tab By Mouth At Bedtime  Allergies (verified): No Known Drug Allergies  Past History:  Past Medical History: IDDM  since about 1996, insuklin dependent HTN  since 2008  Obesity  hyperlipidemia  since 2008 Allergies perrenisal  Past Surgical History: laser surgery to both eyes in 2011, two separate occasion first in 2006  Review of Systems      See HPI Eyes:  Denies blurring and discharge. Endo:  Denies excessive thirst and excessive urination. Heme:  Denies abnormal bruising, bleeding, enlarge lymph nodes, and fevers. Allergy:  Complains of seasonal allergies; denies hives or rash and itching eyes.  Physical Exam  General:  alert, well-hydrated, and overweight-appearing.  HEENT: No facial asymmetry,  EOMI, No sinus tenderness, TM's Clear, oropharynx  pink and moist.   Chest: Clear to auscultation bilaterally.  CVS: S1, S2, No murmurs, No S3.   Abd: Soft, Nontender.  MS: Adequate ROM spine, hips, shoulders and knees.  Ext: No edema.   CNS: CN 2-12 intact, power tone and sensation  normal throughout.   Skin: Intact, no visible lesions or rashes.  Psych: Good eye contact, normal affect.  Memory intact, not anxious or depressed appearing.    Diabetes Management Exam:    Foot Exam (with socks and/or shoes not present):       Sensory-Monofilament:          Left foot: diminished          Right foot: diminished       Inspection:          Left foot: normal          Right foot: normal       Nails:          Left foot: thickened          Right foot: thickened   Impression & Recommendations:  Problem # 1:  HYPERTENSION (ICD-401.9) Assessment Unchanged  Her updated medication list for this problem  includes:    Lasix 20 Mg Tabs (Furosemide) ..... One tab by mouth once daily    Diltiazem Hcl Er Beads 360 Mg Cp24 (Diltiazem hcl er beads) ..... One tab by mouth once daily    Benicar 40 Mg Tabs (Olmesartan medoxomil) ..... One tab by mouth once daily  BP today: 118/74 Prior BP: 120/74 (12/29/2009)  Labs Reviewed: K+: 4.0 (01/20/2009) Creat: : 0.69 (01/20/2009)   Chol: 161 (01/20/2009)   HDL: 53 (01/20/2009)   LDL: 96 (01/20/2009)   TG: 58 (01/20/2009)  Problem # 2:  IDDM (ICD-250.01) Assessment: Unchanged  Her updated medication list for this problem includes:    Aspirin 81 Mg Tbec (Aspirin) ..... One tab by mouth once daily    Metformin Hcl 1000 Mg Tabs (Metformin hcl) ..... One tab by mouth two times a day    Lantus 100 Unit/ml Soln (Insulin glargine) ..... Inject 65 units once daily    Benicar 40 Mg Tabs (Olmesartan medoxomil) ..... One tab by mouth once daily  Labs Reviewed: Creat: 0.69 (01/20/2009)   , followed by endo Reviewed HgBA1c results: 7.4 (12/30/2009)  7.0 (07/22/2009)  Problem # 3:  OBESITY, UNSPECIFIED (ICD-278.00) Assessment: Unchanged  Ht: 65 (09/29/2010)   Wt: 282.50 (09/29/2010)   BMI: 47.18 (09/29/2010) therapeutic lifestyle change discussed and encouraged  Problem # 4:  HYPERLIPIDEMIA (ICD-272.4) Assessment: Comment Only  Her updated medication list for this problem includes:    Crestor 20 Mg Tabs (Rosuvastatin calcium) .Marland Kitchen... Take 1 tab by mouth at bedtime Low fat dietdiscussed and encouraged  Labs Reviewed: SGOT: 10 (01/20/2009)   SGPT: 11 (01/20/2009)   HDL:53 (01/20/2009), 52 (05/18/2008)  LDL:96 (01/20/2009), 96 (05/18/2008)  Chol:161 (01/20/2009), 159 (05/18/2008)  Trig:58 (01/20/2009), 56 (05/18/2008) most recent labs are with endo, and are good  Complete Medication List: 1)  Aspirin 81 Mg Tbec (Aspirin) .... One tab by mouth once daily 2)  Metformin Hcl 1000 Mg Tabs (Metformin hcl) .... One tab by mouth two times a day 3)  Klor-con M10 10  Meq Tbcr (Potassium chloride crys cr) .... Two tabs by mouth once daily 4)  Lasix 20 Mg Tabs (Furosemide) .... One tab by mouth once daily 5)  Lantus 100 Unit/ml Soln (Insulin glargine) .... Inject 65 units once daily 6)  Diltiazem Hcl Er Beads 360 Mg Cp24 (Diltiazem hcl er beads) .... One tab by mouth once daily 7)  Benicar 40 Mg Tabs (Olmesartan medoxomil) .... One tab by mouth once daily 8)  Delsym 30 Mg/48ml Lqcr (Dextromethorphan polistirex) .... As needed 9)  Fexofenadine Hcl 180 Mg  Tabs (Fexofenadine hcl) .... One tab by mouth qd 10)  Accu-chek Compact Strp (Glucose blood) .... Four times daily testing 11)  Ibuprofen 800 Mg Tabs (Ibuprofen) .... One tab by mouth three times a day as needed only. (60 to last 90 days) 12)  Vitamin D 1000 Unit Tabs (Cholecalciferol) .... Take 1 tablet by mouth once a day 13)  Crestor 20 Mg Tabs (Rosuvastatin calcium) .... Take 1 tab by mouth at bedtime  Other Orders: Gastroenterology Referral (GI)  Patient Instructions: 1)  Follow up appointment in 5.19months 2)  It is important that you exercise regularly at least 20 minutes 5 times a week. If you develop chest pain, have severe difficulty breathing, or feel very tired , stop exercising immediately and seek medical attention. 3)  You need to lose weight. Consider a lower calorie diet and regular exercise.  4)  No med changes   Orders Added: 1)  Est. Patient Level IV [57846] 2)  Gastroenterology Referral [GI]

## 2010-12-13 NOTE — Miscellaneous (Signed)
Summary: Refill  Clinical Lists Changes  Medications: Rx of DILTIAZEM HCL ER BEADS 360 MG  CP24 (DILTIAZEM HCL ER BEADS) one tab by mouth once daily;  #60 x 4;  Signed;  Entered by: Everitt Amber;  Authorized by: Syliva Overman MD;  Method used: Electronically to [facility] [address] [place], [place], [place]  [address], Ph: [phone], Fax: [phone]    Prescriptions: DILTIAZEM HCL ER BEADS 360 MG  CP24 (DILTIAZEM HCL ER BEADS) one tab by mouth once daily  #60 x 4   Entered by:   Everitt Amber   Authorized by:   Syliva Overman MD   Signed by:   Everitt Amber on 01/21/2009   Method used:   Electronically to        [facility]  [address] (retail)       [address]       [place], [place]  [address]       Ph: [phone]       Fax: [phone]   RxID:   [id number]

## 2010-12-13 NOTE — Letter (Signed)
Summary: LINK TO [facility]  [facility] TO [facility]   Imported By: Lind Guest 03/08/2010 13:13:23  _____________________________________________________________________  External Attachment:    Type:   Image     Comment:   External Document

## 2010-12-13 NOTE — Letter (Signed)
Summary: dr. Lucianne Muss  dr. Lucianne Muss   Imported By: Lind Guest 08/01/2010 12:50:41  _____________________________________________________________________  External Attachment:    Type:   Image     Comment:   External Document

## 2010-12-13 NOTE — Miscellaneous (Signed)
Summary: Refill  Clinical Lists Changes  Medications: Rx of SIMVASTATIN 80 MG  TABS (SIMVASTATIN) one tab by mouth at bedtime;  #90 x 4;  Signed;  Entered by: Everitt Amber;  Authorized by: Syliva Overman MD;  Method used: Electronically to [facility] [address], [place], [place], [place]  [address], Ph: [phone], Fax: [phone] Rx of LASIX 20 MG  TABS (FUROSEMIDE) one tab by mouth once daily;  #90 Each x 4;  Signed;  Entered by: Everitt Amber;  Authorized by: Syliva Overman MD;  Method used: Electronically to [facility] [address] [place], [place], [place]  [address], Ph: [phone], Fax: [phone]    Prescriptions: LASIX 20 MG  TABS (FUROSEMIDE) one tab by mouth once daily  #90 Each x 4   Entered by:   Everitt Amber   Authorized by:   Syliva Overman MD   Signed by:   Everitt Amber on 03/08/2009   Method used:   Electronically to        [facility]  [address] (retail)       [address]       [place], [place]  [address]       Ph: [phone]       Fax: [phone]   RxID:   [id number] SIMVASTATIN 80 MG  TABS (SIMVASTATIN) one tab by mouth at bedtime  #90 x 4   Entered by:   Everitt Amber   Authorized by:   Syliva Overman MD   Signed by:   Everitt Amber on 03/08/2009   Method used:   Electronically to        [facility]  [address] (retail)       [address]       [place], [place]  [address]       Ph: [phone]       Fax: [phone]   RxID:   [id number]

## 2010-12-13 NOTE — Progress Notes (Signed)
Summary: dr. Lucianne Muss  dr. Lucianne Muss   Imported By: Lind Guest 03/29/2010 08:14:27  _____________________________________________________________________  External Attachment:    Type:   Image     Comment:   External Document

## 2010-12-13 NOTE — Progress Notes (Signed)
  Phone Note Other Incoming   Caller: [facility] Summary of Call: pls d/c simvastatin 80mg  and erx crestor 20mg 1 at bedtime #90 refill 1, pt needs appt oin 3 to4 mths, with fasing lipid and hepatic just before ppt, notify pt  pls (change due to potential drug interaction)  Initial call taken by: Syliva Overman MD,  December 13, 2009 12:23 AM  Follow-up for Phone Call        patient aware Follow-up by: Worthy Keeler LPN,  December 13, 2009 9:45 AM    New/Updated Medications: CRESTOR 20 MG TABS (ROSUVASTATIN CALCIUM) one tab by mouth at bedtime  discontinue simvastatin Prescriptions: CRESTOR 20 MG TABS (ROSUVASTATIN CALCIUM) one tab by mouth at bedtime  discontinue simvastatin  #90 x 1   Entered by:   Worthy Keeler LPN   Authorized by:   Syliva Overman MD   Signed by:   Worthy Keeler LPN on 62/13/0865   Method used:   Electronically to        [facility]* (retail)       [address]       [address] Shipping/mailing       [place], [place]  [address]       Ph: [phone]       Fax: [phone]   RxID:   [id number]

## 2010-12-13 NOTE — Progress Notes (Signed)
Summary: NEEDS MUSCLE RELAXER  Phone Note Call from Patient   Summary of Call: CATCH IN NECK   HURTS  CAN U CALL IN A MUSCLE RELAXER [facility] [place] [phone] IF NO ANSWER LEAVE MESSAGE Initial call taken by: Lind Guest,  December 24, 2008 11:10 AM  Follow-up for Phone Call        advise and erx cyclobenzaprine 10mg  Take 1 tab by mouth at bedtime as needed #30 refill3 Follow-up by: Syliva Overman MD,  December 24, 2008 5:18 PM  Additional Follow-up for Phone Call Additional follow up Details #1::        Rx Called In, patient aware Additional Follow-up by: Worthy Keeler LPN,  December 24, 2008 5:27 PM    New/Updated Medications: CYCLOBENZAPRINE HCL 10 MG TABS (CYCLOBENZAPRINE HCL) one tab by mouth qhs   Prescriptions: CYCLOBENZAPRINE HCL 10 MG TABS (CYCLOBENZAPRINE HCL) one tab by mouth qhs  #30 x 0   Entered by:   Worthy Keeler LPN   Authorized by:   Syliva Overman MD   Signed by:   Worthy Keeler LPN on 16/08/9603   Method used:   [facility]ed to ...       [facility] [address] [facility]       [address]       [place], [place]  [address]       Ph: [phone]       Fax: [phone]   RxID:   [id number]

## 2010-12-13 NOTE — Progress Notes (Signed)
Summary: samples  Phone Note Call from Patient   Summary of Call: wants samples of benicar call [phone] Initial call taken by: Lind Guest,  July 29, 2008 8:56 AM  Follow-up for Phone Call        SAMPLES PUT UP FRONT FOR PATIENT PICK UP AND LEFT MESSAGE FOR PATIENT TO CALL OFFICE Follow-up by: Worthy Keeler LPN,  July 29, 2008 10:39 AM  Additional Follow-up for Phone Call Additional follow up Details #1::        LEFT MESSAGE FOR PATIENT TO RETURN CALL Additional Follow-up by: Worthy Keeler LPN,  July 30, 2008 10:08 AM    Additional Follow-up for Phone Call Additional follow up Details #2::    patient aware samples are available for pick up Follow-up by: Worthy Keeler LPN,  July 30, 2008 2:31 PM  New/Updated Medications: BENICAR 40 MG  TABS (OLMESARTAN MEDOXOMIL) one tab by mouth once daily   Prescriptions: BENICAR 40 MG  TABS (OLMESARTAN MEDOXOMIL) one tab by mouth once daily  #28 x 0   Entered by:   Worthy Keeler LPN   Authorized by:   Syliva Overman MD   Signed by:   Worthy Keeler LPN on 30/86/5784   Method used:   Samples Given   RxID:   [id number]

## 2010-12-14 ENCOUNTER — Ambulatory Visit (HOSPITAL_COMMUNITY)
Admission: RE | Admit: 2010-12-14 | Discharge: 2010-12-14 | Disposition: A | Discharge status: Home / Self Care | Payer: 59 | Source: Ambulatory Visit | Attending: Family Medicine | Admitting: Family Medicine

## 2010-12-14 ENCOUNTER — Encounter: Payer: Self-pay | Admitting: Family Medicine

## 2010-12-14 DIAGNOSIS — M542 Cervicalgia: Secondary | ICD-10-CM | POA: Insufficient documentation

## 2010-12-14 DIAGNOSIS — M6281 Muscle weakness (generalized): Secondary | ICD-10-CM | POA: Insufficient documentation

## 2010-12-14 DIAGNOSIS — IMO0001 Reserved for inherently not codable concepts without codable children: Secondary | ICD-10-CM | POA: Insufficient documentation

## 2010-12-15 ENCOUNTER — Encounter (HOSPITAL_COMMUNITY)
Admission: RE | Admit: 2010-12-15 | Discharge: 2010-12-15 | Disposition: A | Discharge status: Home / Self Care | Payer: 59 | Source: Ambulatory Visit | Attending: Family Medicine | Admitting: Family Medicine

## 2010-12-15 DIAGNOSIS — M542 Cervicalgia: Secondary | ICD-10-CM | POA: Insufficient documentation

## 2010-12-15 DIAGNOSIS — IMO0001 Reserved for inherently not codable concepts without codable children: Secondary | ICD-10-CM | POA: Insufficient documentation

## 2010-12-15 DIAGNOSIS — M6281 Muscle weakness (generalized): Secondary | ICD-10-CM | POA: Insufficient documentation

## 2010-12-15 NOTE — Progress Notes (Signed)
Summary: neck hurting  Phone Note Call from Patient   Summary of Call: called neck hurting she has been taking ibu. and flexrill and not helping offered her appt for friday morning not was not soon enough for her so jamie told me to tell her to go to ed or urgent care so she will go to one of them Initial call taken by: Lind Guest,  October 26, 2010 9:10 AM  Follow-up for Phone Call        noted and agree Follow-up by: Syliva Overman MD,  October 26, 2010 5:33 PM

## 2010-12-15 NOTE — Letter (Signed)
Summary: Kendra Hanson  Kendra Hanson   Imported By: Lind Guest 11/30/2010 10:30:32  _____________________________________________________________________  External Attachment:    Type:   Image     Comment:   External Document

## 2010-12-15 NOTE — Assessment & Plan Note (Signed)
Summary: OV   Vital Signs:  Patient profile:   50 year old female Menstrual status:  irregular Height:      65 inches Weight:      276.25 pounds BMI:     46.14 O2 Sat:      97 % on Room air Pulse rate:   91 / minute Pulse rhythm:   regular Resp:     16 per minute BP sitting:   112 / 70  (left arm)  Vitals Entered By: Adella Hare LPN (November 11, 2010 8:43 AM)  O2 Flow:  Room air CC: left shoulder and neck pain radiates down arm Is Patient Diabetic? Yes   CC:  left shoulder and neck pain radiates down arm.  History of Present Illness: 2 week h/o neck pain radiating down left shoulder and left arm tio mid level. Has had intermittent left handtingling. She has used ibuprofen with some relief but std having stomach pains with it. she has had no trauma to the area. Reports  that she has otherwise been  doing well. Denies recent fever or chills. Denies sinus pressure, nasal congestion , ear pain or sore throat. Denies chest congestion, or cough productive of sputum. Denies chest pain, palpitations, PND, orthopnea or leg swelling. Denies abdominal pain, nausea, vomitting, diarrhea or constipation. Denies change in bowel movements or bloody stool. Denies dysuria , frequency, incontinence or hesitancy.  Denies headaches, vertigo, seizures. Denies depression, anxiety or insomnia. Denies  rash, lesions, or itch. she is experiencing improved blood sugars, and has been successful in recent weight loss       Allergies: No Known Drug Allergies  Review of Systems      See HPI General:  Complains of sleep disorder; disturbed sleep due to arm pain. Eyes:  Denies discharge and red eye. Endo:  Denies cold intolerance, excessive hunger, excessive thirst, excessive urination, and heat intolerance. Heme:  Denies abnormal bruising and bleeding. Allergy:  Denies hives or rash and itching eyes.  Physical Exam  General:  Well-developed,obese,in no acute distress; alert,appropriate  and cooperative throughout examination HEENT: No facial asymmetry,  EOMI, No sinus tenderness, TM's Clear, oropharynx  pink and moist. decreased ROM cervical spine with left trapezius spasm  Chest: Clear to auscultation bilaterally.  CVS: S1, S2, No murmurs, No S3.   Abd: Soft, Nontender.  MS: Adequate ROM spine, hips, shoulders and knees.  Ext: No edema.   CNS: CN 2-12 intact, power tone and sensation normal throughout.   Skin: Intact, no visible lesions or rashes.  Psych: Good eye contact, normal affect.  Memory intact, not anxious or depressed appearing.    Impression & Recommendations:  Problem # 1:  NECK PAIN (ICD-723.1) Assessment Deteriorated  The following medications were removed from the medication list:    Ibuprofen 800 Mg Tabs (Ibuprofen) ..... One tab by mouth three times a day as needed only. (60 to last 90 days)    Tramadol Hcl 50 Mg Tabs (Tramadol hcl) ..... One to two tablets every 6 to 8 hours as needed for acute pain Her updated medication list for this problem includes:    Aspirin 81 Mg Tbec (Aspirin) ..... One tab by mouth once daily    Vimovo 500-20 Mg Tbec (Naproxen-esomeprazole) .Marland Kitchen... Take 1 tablet by mouth two times a day  Orders: Radiology Referral (Radiology) Radiology other (Radiology Other) Physical Therapy Referral (PT) Depo- Medrol 80mg  (J1040) Ketorolac-Toradol 15mg  (E4540) Admin of Therapeutic Inj  intramuscular or subcutaneous (98119)  Problem # 2:  HYPERTENSION (  ICD-401.9) Assessment: Unchanged  Her updated medication list for this problem includes:    Lasix 20 Mg Tabs (Furosemide) ..... One tab by mouth once daily    Diltiazem Hcl Er Beads 360 Mg Cp24 (Diltiazem hcl er beads) ..... One tab by mouth once daily    Benicar 40 Mg Tabs (Olmesartan medoxomil) ..... One tab by mouth once daily  BP today: 112/70 Prior BP: 118/74 (09/29/2010)  Labs Reviewed: K+: 4.0 (01/20/2009) Creat: : 0.69 (01/20/2009)   Chol: 161 (01/20/2009)   HDL: 53  (01/20/2009)   LDL: 96 (01/20/2009)   TG: 58 (01/20/2009)  Problem # 3:  IDDM (ICD-250.01) Assessment: Improved  Her updated medication list for this problem includes:    Aspirin 81 Mg Tbec (Aspirin) ..... One tab by mouth once daily    Metformin Hcl 1000 Mg Tabs (Metformin hcl) ..... One tab by mouth two times a day    Lantus 100 Unit/ml Soln (Insulin glargine) ..... Inject 50units at bedtime    Benicar 40 Mg Tabs (Olmesartan medoxomil) ..... One tab by mouth once daily    Novolog Flexpen 100 Unit/ml Soln (Insulin aspart) .Marland Kitchen... 12 units at bedtime    Victoza 18 Mg/38ml Soln (Liraglutide) .Marland Kitchen... 1.2 micrograms at bedtime treated by endocrinologist Labs Reviewed: Creat: 0.69 (01/20/2009)    Reviewed HgBA1c results: 7.4 (12/30/2009)  7.0 (07/22/2009)  Problem # 4:  OBESITY, UNSPECIFIED (ICD-278.00) Assessment: Improved  Ht: 65 (11/11/2010)   Wt: 276.25 (11/11/2010)   BMI: 46.14 (11/11/2010) therapeutic lifestyle change discussed and encouraged  Complete Medication List: 1)  Aspirin 81 Mg Tbec (Aspirin) .... One tab by mouth once daily 2)  Metformin Hcl 1000 Mg Tabs (Metformin hcl) .... One tab by mouth two times a day 3)  Klor-con M10 10 Meq Tbcr (Potassium chloride crys cr) .... Two tabs by mouth once daily 4)  Lasix 20 Mg Tabs (Furosemide) .... One tab by mouth once daily 5)  Lantus 100 Unit/ml Soln (Insulin glargine) .... Inject 50units at bedtime 6)  Diltiazem Hcl Er Beads 360 Mg Cp24 (Diltiazem hcl er beads) .... One tab by mouth once daily 7)  Benicar 40 Mg Tabs (Olmesartan medoxomil) .... One tab by mouth once daily 8)  Delsym 30 Mg/49ml Lqcr (Dextromethorphan polistirex) .... As needed 9)  Fexofenadine Hcl 180 Mg Tabs (Fexofenadine hcl) .... One tab by mouth qd 10)  Accu-chek Compact Strp (Glucose blood) .... Four times daily testing 11)  Vitamin D 1000 Unit Tabs (Cholecalciferol) .... Take 1 tablet by mouth once a day 12)  Crestor 20 Mg Tabs (Rosuvastatin calcium) .... Take 1  tab by mouth at bedtime 13)  Mucinex  .... As needed 14)  Novolog Flexpen 100 Unit/ml Soln (Insulin aspart) .Marland Kitchen.. 12 units at bedtime 15)  Victoza 18 Mg/52ml Soln (Liraglutide) .... 1.2 micrograms at bedtime 16)  Vimovo 500-20 Mg Tbec (Naproxen-esomeprazole) .... Take 1 tablet by mouth two times a day 17)  Prednisone (pak) 5 Mg Tabs (Prednisone) .... Use as directed 18)  Diazepam 5 Mg Tabs (Diazepam) .... Take 1 tab by mouth at bedtime  for muscle spasm, take for 7 days starting tonight, then as needed  Patient Instructions: 1)  F/U as before 2)  You are being treated for neck pain radiating to your arm. 3)  You are being referred to  physical therapy. 4)  You need to get an x ray of your neck. 5)  Pls take meds as directed. 6)  Pls call if symptoms persist or worsen Prescriptions:  DIAZEPAM 5 MG TABS (DIAZEPAM) Take 1 tab by mouth at bedtime  for muscle spasm, take for 7 days starting tonight, then as needed  #30 x 0   Entered and Authorized by:   Syliva Overman MD   Signed by:   Syliva Overman MD on 11/11/2010   Method used:   Printed then faxed to ...       [facility]  [address] [address] [facility]       [address]       [place], [place]  [address]       Ph: [phone]       Fax: [phone]   RxID:   [id number] DIAZEPAM 5 MG/ML SOLN (DIAZEPAM) Take 1 tab by mouth at bedtime as needed for muscle spasm, pls taake every night for 7 days  #30 x 0   Entered and Authorized by:   Syliva Overman MD   Signed by:   Syliva Overman MD on 11/11/2010   Method used:   Printed then faxed to ...       [facility] [facility] [address] [facility] ([facility]       [address]       [place], [place]  [address]       Ph: [phone]       Fax: [phone]   RxID:   [id number] PREDNISONE (PAK) 5 MG TABS (PREDNISONE) Use as directed  #21 x 0   Entered and Authorized by:   Syliva Overman MD   Signed by:   Syliva Overman MD on 11/11/2010   Method used:   Electronically to         [facility]  [address] (retail)       [address]       [place], [place]  [address]       Ph: [phone]       Fax: [phone]   RxID:   [id number] VIMOVO 500-20 MG TBEC (NAPROXEN-ESOMEPRAZOLE) Take 1 tablet by mouth two times a day  #24 x 0   Entered and Authorized by:   Syliva Overman MD   Signed by:   Syliva Overman MD on 11/11/2010   Method used:   Samples Given   RxID:   [id number]    Medication Administration  Injection # 1:    Medication: Depo- Medrol 80mg     Diagnosis: NECK PAIN (ICD-723.1)    Route: IM    Site: RUOQ gluteus    Exp Date: 07/12    Lot #: Gunnar [id number]    Mfr: Pharmacia    Patient tolerated injection without complications    Given by: Adella Hare LPN (November 11, 2010 11:07 AM)  Injection # 2:    Medication: Ketorolac-Toradol 15mg     Diagnosis: NECK PAIN (ICD-723.1)    Route: IM    Site: LUOQ gluteus    Exp Date: 04/13/2012    Lot #: [id number]    Mfr: NOVAPLUS    Comments: TORADOL 60MG  GIVEN    Patient tolerated injection without complications    Given by: Adella Hare LPN (November 11, 2010 11:07 AM)  Orders Added: 1)  Est. Patient Level IV [18299] 2)  Radiology Referral [Radiology] 3)  Radiology other [Radiology Other] 4)  Physical Therapy Referral [PT] 5)  Depo- Medrol 80mg  [J1040] 6)  Ketorolac-Toradol 15mg  [J1885] 7)  Admin of Therapeutic Inj  intramuscular or  subcutaneous [id number]     Medication Administration  Injection # 1:    Medication: Depo- Medrol 80mg     Diagnosis: NECK PAIN (ICD-723.1)    Route: IM    Site: RUOQ gluteus    Exp Date: 07/12    Lot #: Gunnar [id number]    Mfr: Pharmacia    Patient tolerated injection without complications    Given by: Adella Hare LPN (November 11, 2010 11:07 AM)  Injection # 2:    Medication: Ketorolac-Toradol 15mg     Diagnosis: NECK PAIN (ICD-723.1)    Route: IM    Site: LUOQ gluteus    Exp Date: 04/13/2012    Lot #: [id number]    Mfr: NOVAPLUS    Comments:  TORADOL 60MG  GIVEN    Patient tolerated injection without complications    Given by: Adella Hare LPN (November 11, 2010 11:07 AM)  Orders Added: 1)  Est. Patient Level IV [45409] 2)  Radiology Referral [Radiology] 3)  Radiology other [Radiology Other] 4)  Physical Therapy Referral [PT] 5)  Depo- Medrol 80mg  [J1040] 6)  Ketorolac-Toradol 15mg  [J1885] 7)  Admin of Therapeutic Inj  intramuscular or subcutaneous [96372]  VIMOVO SAMPLES GIVEN [id number]  01/21/12

## 2010-12-15 NOTE — Progress Notes (Signed)
Summary: neck and shoulder  Phone Note Call from Patient   Summary of Call: called again wants to know will you call in some pain medicine she has been taking flexril and ibu for 2 days has done no good and she can not get a appoinment with you call her on her cell # [phone] shoulder and neck Initial call taken by: Lind Guest,  October 26, 2010 3:01 PM  Follow-up for Phone Call        prescription written plss fax to pharm [facility] her cho[facility], the cell # is incorrectly written and the home # disconnected Follow-up by: Syliva Overman MD,  October 26, 2010 5:37 PM  Additional Follow-up for Phone Call Additional follow up Details #1::        med sent Additional Follow-up by: Adella Hare LPN,  October 27, 2010 11:32 AM    New/Updated Medications: TRAMADOL HCL 50 MG TABS (TRAMADOL HCL) one to two tablets every 6 to 8 hours as needed for acute pain Prescriptions: TRAMADOL HCL 50 MG TABS (TRAMADOL HCL) one to two tablets every 6 to 8 hours as needed for acute pain  #40 x 0   Entered and Authorized by:   Syliva Overman MD   Signed by:   Syliva Overman MD on 10/26/2010   Method used:   Printed then faxed to ...       [facility] (retail)       [address]       [address] Shipping/mailing       [place], [place]  [address]       Ph: [phone]       Fax: [phone]   RxID:   [id number]

## 2010-12-15 NOTE — Progress Notes (Signed)
Summary: please advise  Phone Note Call from Patient   Summary of Call: [facility] states that [facility] only recieved prednisone medication and she also wanted to get results from test this morning. Initial call taken by: Curtis Sites,  November 11, 2010 11:37 AM  Follow-up for Phone Call        patient aware xray normal and states pharmacy does have all meds Follow-up by: Adella Hare LPN,  November 11, 2010 1:47 PM

## 2010-12-16 ENCOUNTER — Encounter: Payer: Self-pay | Admitting: Family Medicine

## 2010-12-19 ENCOUNTER — Telehealth: Payer: Self-pay | Admitting: Family Medicine

## 2010-12-21 ENCOUNTER — Ambulatory Visit (HOSPITAL_COMMUNITY)
Admission: RE | Admit: 2010-12-21 | Discharge: 2010-12-21 | Disposition: A | Discharge status: Home / Self Care | Payer: 59 | Source: Ambulatory Visit | Attending: Family Medicine | Admitting: Family Medicine

## 2010-12-21 DIAGNOSIS — M6281 Muscle weakness (generalized): Secondary | ICD-10-CM | POA: Insufficient documentation

## 2010-12-21 DIAGNOSIS — M542 Cervicalgia: Secondary | ICD-10-CM | POA: Insufficient documentation

## 2010-12-21 DIAGNOSIS — IMO0001 Reserved for inherently not codable concepts without codable children: Secondary | ICD-10-CM | POA: Insufficient documentation

## 2010-12-21 DIAGNOSIS — E119 Type 2 diabetes mellitus without complications: Secondary | ICD-10-CM | POA: Insufficient documentation

## 2010-12-21 DIAGNOSIS — I10 Essential (primary) hypertension: Secondary | ICD-10-CM | POA: Insufficient documentation

## 2010-12-21 NOTE — Letter (Signed)
Summary: physical therapy  physical therapy   Imported By: Lind Guest 12/16/2010 09:19:48  _____________________________________________________________________  External Attachment:    Type:   Image     Comment:   External Document

## 2010-12-21 NOTE — Letter (Signed)
Summary: d/c medicine  d/c medicine   Imported By: Lind Guest 12/14/2010 11:23:33  _____________________________________________________________________  External Attachment:    Type:   Image     Comment:   External Document

## 2010-12-21 NOTE — Progress Notes (Signed)
Summary: medication  Phone Note Call from Patient   Summary of Call: patient needs vimoco 500 mg/20 samples diazepam 5 mg. patient states she is still taking therapy and needs something for pain.  Please advise [facility] uses [facility] in [place]... [phone]. Initial call taken by: Lind Guest,  December 12, 2010 2:37 PM  Follow-up for Phone Call        pls advise no samples available, provide coupon with script entered histyorically and also refill the valium   Additional Follow-up for Phone Call Additional follow up Details #1::        called patient, left message Additional Follow-up by: Adella Hare LPN,  December 13, 2010 12:10 PM    Additional Follow-up for Phone Call Additional follow up Details #2::    patient aware Follow-up by: Adella Hare LPN,  December 13, 2010 3:56 PM  New/Updated Medications: VIMOVO 375-20 MG TBEC (NAPROXEN-ESOMEPRAZOLE) Take 1 tablet by mouth two times a day as needed for pain Prescriptions: VIMOVO 375-20 MG TBEC (NAPROXEN-ESOMEPRAZOLE) Take 1 tablet by mouth two times a day as needed for pain  #60 x 0   Entered by:   Adella Hare LPN   Authorized by:   Syliva Overman MD   Signed by:   Adella Hare LPN on 45/40/9811   Method used:   Printed then faxed to ...       [facility]* (retail)       [address]       [address] Shipping/mailing       [place], [place]  [address]       Ph: [phone]       Fax: [phone]   RxID:   [id number] DIAZEPAM 5 MG TABS (DIAZEPAM) Take 1 tab by mouth at bedtime  for muscle spasm, take for 7 days starting tonight, then as needed  #30 x 0   Entered by:   Adella Hare LPN   Authorized by:   Syliva Overman MD   Signed by:   Adella Hare LPN on 66/44/0347   Method used:   Printed then faxed to ...       [facility]* (retail)       [address]       [address] Shipping/mailing       [place], [place]  [address]       Ph: [phone]       Fax: [phone]  RxID:   [id number] VIMOVO 375-20 MG TBEC (NAPROXEN-ESOMEPRAZOLE) Take 1 tablet by mouth two times a day as needed for pain  #60 x 0   Entered and Authorized by:   Syliva Overman MD   Signed by:   Adella Hare LPN on 22/12/5425   Method used:   Historical   RxID:   [id number]

## 2010-12-21 NOTE — Miscellaneous (Signed)
Summary: Meter  Clinical Lists Changes  Medications: Added new medication of * ACCUCHEK COMPACT PLUS METER UAD - Signed Added new medication of * ACCU CHEK COMPACT PLUS STRIPS AND LANCETS for four times a day testing - Signed Rx of ACCUCHEK COMPACT PLUS METER UAD;  #1 x 0;  Signed;  Entered by: Everitt Amber LPN;  Authorized by: Syliva Overman MD;  Method used: Printed then faxed to [facility] [address], [place], [place], [place]  [address], Ph: [phone], Fax: [phone] Rx of ACCU CHEK COMPACT PLUS STRIPS AND LANCETS for four times a day testing;  #90days x 3;  Signed;  Entered by: Everitt Amber LPN;  Authorized by: Syliva Overman MD;  Method used: Printed then faxed to [facility] [address] [place], [place], [place]  [address], Ph: [phone], Fax: [phone]    Prescriptions: ACCU CHEK COMPACT PLUS STRIPS AND LANCETS for four times a day testing  #90days x 3   Entered by:   Everitt Amber LPN   Authorized by:   Syliva Overman MD   Signed by:   Everitt Amber LPN on 02/72/5366   Method used:   Printed then faxed to ...       [facility]  [address] [facility]       [address]       [place], [place]  [address]       Ph: [phone]       Fax: [phone]   RxID:   [id number] ACCUCHEK COMPACT PLUS METER UAD  #1 x 0   Entered by:   Everitt Amber LPN   Authorized by:   Syliva Overman MD   Signed by:   Everitt Amber LPN on 23/55/7322   Method used:   Printed then faxed to ...       [facility]  [address] [facility])       [address]       [place], [place]  [address]       Ph: [phone]       Fax: [phone]   RxID:   [id number]

## 2010-12-21 NOTE — Progress Notes (Signed)
  Phone Note Call from Patient   Caller: Patient Summary of Call: patient requesting med cheaper than benicar, wants somthing she can get on discount at [facility] states she know she cant take lotensin Initial call taken by: Adella Hare LPN,  December 13, 2010 4:24 PM  Follow-up for Phone Call        I assume losartan 100mg  is free, as it  is generic will send in 90 day supply, she needs nurse visit for Bp test 5 to 6 weeks after starting , will need to sched this. pls  send in the med entered historically with a st on the benicar , New med is entered historically Follow-up by: Syliva Overman MD,  December 13, 2010 4:48 PM    New/Updated Medications: LOSARTAN POTASSIUM 100 MG TABS (LOSARTAN POTASSIUM) Take 1 tablet by mouth once a day Prescriptions: LOSARTAN POTASSIUM 100 MG TABS (LOSARTAN POTASSIUM) Take 1 tablet by mouth once a day  #90 x 1   Entered by:   Everitt Amber LPN   Authorized by:   Syliva Overman MD   Signed by:   Everitt Amber LPN on 16/08/9603   Method used:   Printed then faxed to ...       [facility]  [address] [facility]       [address]       [place], [place]  [address]       Ph: [phone]       Fax: [phone]   RxID:   [id number]  Patient aware

## 2010-12-22 ENCOUNTER — Ambulatory Visit (HOSPITAL_COMMUNITY)
Admission: RE | Admit: 2010-12-22 | Discharge: 2010-12-22 | Disposition: A | Discharge status: Home / Self Care | Payer: 59 | Source: Ambulatory Visit | Attending: Family Medicine | Admitting: Family Medicine

## 2010-12-22 DIAGNOSIS — M6281 Muscle weakness (generalized): Secondary | ICD-10-CM | POA: Insufficient documentation

## 2010-12-22 DIAGNOSIS — M542 Cervicalgia: Secondary | ICD-10-CM | POA: Insufficient documentation

## 2010-12-22 DIAGNOSIS — IMO0001 Reserved for inherently not codable concepts without codable children: Secondary | ICD-10-CM | POA: Insufficient documentation

## 2010-12-27 ENCOUNTER — Ambulatory Visit (HOSPITAL_COMMUNITY): Payer: Self-pay | Admitting: Physical Therapy

## 2010-12-28 ENCOUNTER — Ambulatory Visit (HOSPITAL_COMMUNITY)
Admission: RE | Admit: 2010-12-28 | Discharge: 2010-12-28 | Disposition: A | Discharge status: Home / Self Care | Payer: 59 | Source: Ambulatory Visit | Attending: Orthopedic Surgery | Admitting: Orthopedic Surgery

## 2010-12-28 ENCOUNTER — Encounter: Payer: Self-pay | Admitting: Family Medicine

## 2010-12-28 DIAGNOSIS — M542 Cervicalgia: Secondary | ICD-10-CM | POA: Insufficient documentation

## 2010-12-28 DIAGNOSIS — IMO0001 Reserved for inherently not codable concepts without codable children: Secondary | ICD-10-CM | POA: Insufficient documentation

## 2010-12-28 DIAGNOSIS — M6281 Muscle weakness (generalized): Secondary | ICD-10-CM | POA: Insufficient documentation

## 2010-12-29 ENCOUNTER — Ambulatory Visit (HOSPITAL_COMMUNITY): Payer: Self-pay

## 2010-12-29 NOTE — Progress Notes (Signed)
Summary: medicine  Phone Note Call from Patient   Summary of Call: the pharmacy [facility] brought her medicine so she does not need for you to call them in Initial call taken by: Lind Guest,  December 19, 2010 2:09 PM  Follow-up for Phone Call        noted Follow-up by: Adella Hare LPN,  December 20, 2010 11:51 AM

## 2011-01-04 NOTE — Letter (Signed)
Summary: physical therapy  physical therapy   Imported By: Lind Guest 12/30/2010 09:58:11  _____________________________________________________________________  External Attachment:    Type:   Image     Comment:   External Document

## 2011-01-12 ENCOUNTER — Telehealth: Payer: Self-pay | Admitting: Family Medicine

## 2011-01-12 ENCOUNTER — Encounter: Payer: Self-pay | Admitting: Family Medicine

## 2011-01-12 ENCOUNTER — Encounter (INDEPENDENT_AMBULATORY_CARE_PROVIDER_SITE_OTHER): Payer: 59

## 2011-01-12 DIAGNOSIS — R609 Edema, unspecified: Secondary | ICD-10-CM | POA: Insufficient documentation

## 2011-01-13 LAB — CONVERTED CEMR LAB
BUN: 16 mg/dL (ref 6–23)
CO2: 24 meq/L (ref 19–32)
Calcium: 9.1 mg/dL (ref 8.4–10.5)
Chloride: 104 meq/L (ref 96–112)
Creatinine, Ser: 0.81 mg/dL (ref 0.40–1.20)
Glucose, Bld: 102 mg/dL — ABNORMAL HIGH (ref 70–99)
Potassium: 4.1 meq/L (ref 3.5–5.3)
Sodium: 143 meq/L (ref 135–145)

## 2011-01-19 NOTE — Progress Notes (Signed)
  Phone Note Other Incoming   Caller: dr Mcdaniel Ohms Summary of Call: I left a msg for Amamda to call after 1 She was unavailable when i called. Pls tell her we spoke with Dr Isidoro Donning office re Peyton Najjar her husband, they said they would getin touch with him with an appt, earlier today, if no one has heard, then she/he should call back and get the appt set up, thanks Initial call taken by: Syliva Overman MD,  January 12, 2011 12:30 PM  Follow-up for Phone Call        patient aware Follow-up by: Lind Guest,  January 12, 2011 3:48 PM

## 2011-01-19 NOTE — Assessment & Plan Note (Signed)
Summary: blood pressure  Nurse Visit   Vital Signs:  Patient profile:   50 year old female Menstrual status:  irregular Weight:      287.75 pounds BP sitting:   130 / 68  (right arm) Comments patient has gained 11 lbs since 12/30/1 and  states her feet are swelling  patient is on lasix 20mg  daily   Allergies: No Known Drug Allergies  Medication Administration  Injection # 1:    Medication: Furosemide- Lasix Injection    Diagnosis: EDEMA (ICD-782.3)    Route: IM    Site: RUOQ gluteus    Exp Date: 06/12    Lot #: [id number]    Mfr: American Regent    Comments: lasix 10mg  given    Patient tolerated injection without complications    Given by: Adella Hare LPN (January 11, 1609 4:49 PM)  Orders Added: 1)  T-Basic Metabolic Panel [phone] 2)  Furosemide- Lasix Injection [J1940] 3)  Admin of Therapeutic Inj  intramuscular or subcutaneous [96372] Prescriptions: KLOR-CON M10 10 MEQ  TBCR (POTASSIUM CHLORIDE CRYS CR) three tabs by mouth once daily  #90 x 2   Entered by:   Adella Hare LPN   Authorized by:   Syliva Overman MD   Signed by:   Adella Hare LPN on 19/14/7829   Method used:   Electronically to        [facility]* (retail)       [address]       [address] Shipping/mailing       [place], [place]  [address]       Ph: [phone]       Fax: [phone]   RxID:   [id number] LASIX 20 MG  TABS (FUROSEMIDE) one tab by mouth two times a day  #60 x 2   Entered by:   Adella Hare LPN   Authorized by:   Syliva Overman MD   Signed by:   Adella Hare LPN on 74/25/9563   Method used:   Electronically to        [facility]* (retail)       [address]       [address] Shipping/mailing       [place], [place]  [address]       Ph: [phone]       Fax: [phone]   RxID:   [id number]  pt examined , has 2 plus edema, pitting, pls administer lasix 10mg  iM, advise dose increase on the lasix to 20mg  one twice daily  and potassium one three times daily, she needs a chem 7 today also , not stat, dx, htn and leg swelling, send new scripts to pharmacy please  Medication Administration  Injection # 1:    Medication: Furosemide- Lasix Injection    Diagnosis: EDEMA (ICD-782.3)    Route: IM    Site: RUOQ gluteus    Exp Date: 06/12    Lot #: [id number]    Mfr: American Regent    Comments: lasix 10mg  given    Patient tolerated injection without complications    Given by: Adella Hare LPN (January 11, 7061 4:49 PM)  Orders Added: 1)  T-Basic Metabolic Panel [phone] 2)  Furosemide- Lasix Injection [J1940] 3)  Admin of Therapeutic Inj  intramuscular or subcutaneous [96372]  noted and I did examine pt and give new med orders

## 2011-03-28 NOTE — Procedures (Signed)
Kendra Hanson, PEMBER              ACCOUNT NO.:  [id number]   MEDICAL RECORD NO.:  [id number]          PATIENT TYPE:  OUT   LOCATION:  SLEE                          FACILITY:  [facility]   PHYSICIAN:  Kofi A. Gerilyn Pilgrim, M.D. DATE OF BIRTH:  1961-08-10   DATE OF PROCEDURE:  01/27/2008  DATE OF DISCHARGE:  01/27/2008                             SLEEP DISORDER REPORT   REFERRING PHYSICIAN:  Dani Gobble, M.D.   REASON FOR CONSULTATION:  The patient is a 50 year old lady who has  snoring, insomnia, and is being evaluated for obstructive sleep apnea  syndrome.  BMI 47.   MEDICATIONS:  Aspirin, Metformin, Diltiazem, Glipizide, Allegra, Lasix,  Insulin.   EPWORTH SLEEPINESS SCALE:  9.   SLEEP STATE SUMMARY:  The total recording time was 421 minutes.  The  sleep efficiency is 45%, sleep latency 63.5 minutes, REM latency 350  minutes.  Stage N1 38, N2 56, N3 2.6, and REM 3.1.   RESPIRATORY SUMMARY:  Baseline oxygen saturation 98, lowest saturation  85%.  The overall AHI is 4.1.   LIMB MOVEMENT SUMMARY:  PLM index 14.   ELECTROCARDIOGRAM INDEX:  Average heart rate 83.  No dysrhythmias  observed.   IMPRESSION:  1. 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  2. Poor sleep efficiency indicating disruptive sleep without any clear      polysomnographic etiology.   Thanks for this referral.      Kofi A. Gerilyn Pilgrim, M.D.  Electronically Signed     KAD/MEDQ  D:  01/28/2008  T:  01/29/2008  Job:  [id number]

## 2011-04-05 ENCOUNTER — Encounter: Payer: Self-pay | Admitting: Family Medicine

## 2011-04-06 ENCOUNTER — Ambulatory Visit: Payer: Self-pay | Admitting: Family Medicine

## 2011-04-07 ENCOUNTER — Encounter: Payer: Self-pay | Admitting: Family Medicine

## 2011-04-11 ENCOUNTER — Telehealth: Payer: Self-pay | Admitting: Family Medicine

## 2011-04-11 ENCOUNTER — Encounter: Payer: Self-pay | Admitting: Family Medicine

## 2011-04-11 NOTE — Telephone Encounter (Signed)
What happened , she missed her recent appt, pls give her a work in apptthis week if any cancellations fill or just work in one day, she NEEDS to keep appt

## 2011-04-17 ENCOUNTER — Encounter: Payer: Self-pay | Admitting: Family Medicine

## 2011-04-18 ENCOUNTER — Ambulatory Visit (INDEPENDENT_AMBULATORY_CARE_PROVIDER_SITE_OTHER): Payer: Commercial Managed Care - PPO | Admitting: Family Medicine

## 2011-04-18 ENCOUNTER — Encounter: Payer: Self-pay | Admitting: Family Medicine

## 2011-04-18 ENCOUNTER — Encounter: Payer: Self-pay | Admitting: *Deleted

## 2011-04-18 VITALS — BP 120/60 | HR 101 | Resp 16 | Ht 64.5 in | Wt 279.1 lb

## 2011-04-18 DIAGNOSIS — I10 Essential (primary) hypertension: Secondary | ICD-10-CM

## 2011-04-18 DIAGNOSIS — R5381 Other malaise: Secondary | ICD-10-CM

## 2011-04-18 DIAGNOSIS — E109 Type 1 diabetes mellitus without complications: Secondary | ICD-10-CM

## 2011-04-18 DIAGNOSIS — J329 Chronic sinusitis, unspecified: Secondary | ICD-10-CM

## 2011-04-18 DIAGNOSIS — J40 Bronchitis, not specified as acute or chronic: Secondary | ICD-10-CM

## 2011-04-18 DIAGNOSIS — R5383 Other fatigue: Secondary | ICD-10-CM

## 2011-04-18 DIAGNOSIS — E785 Hyperlipidemia, unspecified: Secondary | ICD-10-CM

## 2011-04-18 DIAGNOSIS — F329 Major depressive disorder, single episode, unspecified: Secondary | ICD-10-CM

## 2011-04-18 DIAGNOSIS — Z13820 Encounter for screening for osteoporosis: Secondary | ICD-10-CM

## 2011-04-18 DIAGNOSIS — F3289 Other specified depressive episodes: Secondary | ICD-10-CM

## 2011-04-18 MED ORDER — BENZONATATE 100 MG PO CAPS: 100.0000 mg | ORAL_CAPSULE | Freq: Four times a day (QID) | ORAL | Status: DC | PRN

## 2011-04-18 MED ORDER — AZITHROMYCIN 250 MG PO TABS: ORAL_TABLET | ORAL | Status: DC

## 2011-04-18 NOTE — Progress Notes (Signed)
  Subjective:    [name], female    DOB: 04/29/1961, 50 y.o.   MRN: [id number]  HPI Stuffy head x 5 days, cough today, fever and chills,  Intermittently x 1 day, sore throat and ear pressure.  Pt also reports excessive amount of stress both in her personal life as well as with work which is becoming increasingly challenging in the past aspprox 4  To 6 weeks. She feels overwhelmed, anxious, reports excessive crying spells, is experiencing increasing difficulty with focusing, concentration and memory, to the extent that it is negatively impacting her ability to perform on the job. Feels as though she needs several weeks off to correct this. She is not suicidal or homicidal, and is not hallucinating.I explained the absolute need to seek therapy, which is available through her job, as well as to see a psychiatrist so she can get the amount of leave that she feels she requires at this time. She understands and agrees    Review of Systems  Constitutional: Positive for fever, chills and malaise/fatigue.  HENT: Positive for ear pain and congestion.   Eyes: Negative.   Respiratory: Positive for cough and sputum production.   Cardiovascular: Negative.   Gastrointestinal: Negative.   Musculoskeletal: Positive for myalgias.  Skin: Negative.   Neurological: Positive for weakness and headaches.  Hematological: Negative.   Psychiatric/Behavioral: Positive for depression and memory loss. Negative for suicidal ideas. The patient is nervous/anxious and has insomnia.    See HPI. Review of Systems  Constitutional: Positive for fever, chills and malaise/fatigue.  HENT: Positive for ear pain and congestion.   Eyes: Negative.   Respiratory: Positive for cough and sputum production.   Cardiovascular: Negative.   Gastrointestinal: Negative.   Musculoskeletal: Positive for myalgias.  Skin: Negative.   Neurological: Positive for weakness and headaches.  Endo/Heme/Allergies: Negative.     Psychiatric/Behavioral: Positive for depression and memory loss. Negative for suicidal ideas. The patient is nervous/anxious and has insomnia.        Objective:   Physical Exam  Constitutional: She is oriented to person, place, and time. She appears well-nourished.  HENT:  Head: Normocephalic.  Right Ear: External ear normal.  Left Ear: External ear normal.       Erythema and edema of nasal mucosa, tender over frontal and maxillary sinuses  Eyes: EOM are normal.  Neck: Neck supple. No thyromegaly present.  Cardiovascular: Normal rate, regular rhythm and normal heart sounds.   Pulmonary/Chest: She has wheezes. She has rales.  Abdominal: Soft. Bowel sounds are normal. There is no tenderness.  Musculoskeletal: Normal range of motion.  Lymphadenopathy:    She has cervical adenopathy.  Neurological: She is alert and oriented to person, place, and time. A cranial nerve deficit is present.  Psychiatric: Her behavior is normal. Judgment normal. Her mood appears anxious. Thought content is not paranoid and not delusional. Cognition and memory are normal. She exhibits a depressed mood. She expresses no homicidal and no suicidal ideation. She expresses no suicidal plans and no homicidal plans.       Tearful and anxious, worried about her personal health since a lot of close family members have been sick          Assessment & Plan:

## 2011-04-18 NOTE — Patient Instructions (Addendum)
F/u in 2 months.  You are being treated for sinusitis and bronchitis.  Work excuse to return in 04/24/2011.  You need to call [facility] for counseling through our job call in am for the number.  Med is sent in    CBC, fasting chem 7 lipid, hepatic, hBA1C , TSH and vit D  As soon possible

## 2011-04-19 ENCOUNTER — Ambulatory Visit (HOSPITAL_COMMUNITY)
Admission: RE | Admit: 2011-04-19 | Discharge: 2011-04-19 | Disposition: A | Discharge status: Home / Self Care | Payer: 59 | Source: Ambulatory Visit | Attending: Family Medicine | Admitting: Family Medicine

## 2011-04-19 DIAGNOSIS — R05 Cough: Secondary | ICD-10-CM | POA: Insufficient documentation

## 2011-04-19 DIAGNOSIS — R059 Cough, unspecified: Secondary | ICD-10-CM | POA: Insufficient documentation

## 2011-04-19 DIAGNOSIS — I10 Essential (primary) hypertension: Secondary | ICD-10-CM | POA: Insufficient documentation

## 2011-04-19 DIAGNOSIS — J40 Bronchitis, not specified as acute or chronic: Secondary | ICD-10-CM

## 2011-04-19 DIAGNOSIS — E119 Type 2 diabetes mellitus without complications: Secondary | ICD-10-CM | POA: Insufficient documentation

## 2011-04-19 LAB — CBC WITH DIFFERENTIAL/PLATELET
Basophils Absolute: 0 10*3/uL (ref 0.0–0.1)
Basophils Relative: 1 % (ref 0–1)
Eosinophils Absolute: 0.2 10*3/uL (ref 0.0–0.7)
Eosinophils Relative: 3 % (ref 0–5)
HCT: 39.2 % (ref 36.0–46.0)
Hemoglobin: 13 g/dL (ref 12.0–15.0)
Lymphocytes Relative: 27 % (ref 12–46)
Lymphs Abs: 2 10*3/uL (ref 0.7–4.0)
MCH: 29 pg (ref 26.0–34.0)
MCHC: 33.2 g/dL (ref 30.0–36.0)
MCV: 87.5 fL (ref 78.0–100.0)
Monocytes Absolute: 0.6 10*3/uL (ref 0.1–1.0)
Monocytes Relative: 8 % (ref 3–12)
Neutro Abs: 4.5 10*3/uL (ref 1.7–7.7)
Neutrophils Relative %: 62 % (ref 43–77)
Platelets: 300 10*3/uL (ref 150–400)
RBC: 4.48 MIL/uL (ref 3.87–5.11)
RDW: 13.9 % (ref 11.5–15.5)
WBC: 7.3 10*3/uL (ref 4.0–10.5)

## 2011-04-19 LAB — HEPATIC FUNCTION PANEL
ALT: 11 U/L (ref 0–35)
AST: 12 U/L (ref 0–37)
Albumin: 4.2 g/dL (ref 3.5–5.2)
Alkaline Phosphatase: 111 U/L (ref 39–117)
Bilirubin, Direct: 0.2 mg/dL (ref 0.0–0.3)
Indirect Bilirubin: 0.6 mg/dL (ref 0.0–0.9)
Total Bilirubin: 0.8 mg/dL (ref 0.3–1.2)
Total Protein: 7 g/dL (ref 6.0–8.3)

## 2011-04-19 LAB — HEMOGLOBIN A1C
Hgb A1c MFr Bld: 7.3 % — ABNORMAL HIGH (ref <5.7)
Mean Plasma Glucose: 163 mg/dL — ABNORMAL HIGH (ref <117)

## 2011-04-19 LAB — LIPID PANEL
Cholesterol: 136 mg/dL (ref 0–200)
HDL: 43 mg/dL (ref >39)
LDL Cholesterol: 82 mg/dL (ref 0–99)
Total CHOL/HDL Ratio: 3.2 Ratio
Triglycerides: 53 mg/dL (ref <150)
VLDL: 11 mg/dL (ref 0–40)

## 2011-04-19 LAB — TSH: TSH: 1.773 u[IU]/mL (ref 0.350–4.500)

## 2011-04-19 LAB — BASIC METABOLIC PANEL
BUN: 9 mg/dL (ref 6–23)
CO2: 27 mEq/L (ref 19–32)
Calcium: 9.2 mg/dL (ref 8.4–10.5)
Chloride: 104 mEq/L (ref 96–112)
Creat: 0.73 mg/dL (ref 0.50–1.10)
Glucose, Bld: 169 mg/dL — ABNORMAL HIGH (ref 70–99)
Potassium: 4.6 mEq/L (ref 3.5–5.3)
Sodium: 139 mEq/L (ref 135–145)

## 2011-04-19 MED ORDER — AZITHROMYCIN 250 MG PO TABS: ORAL_TABLET | ORAL | Status: AC

## 2011-04-22 LAB — VITAMIN D 1,25 DIHYDROXY
Vitamin D 1, 25 (OH)2 Total: 40 pg/mL (ref 18–72)
Vitamin D2 1, 25 (OH)2: <8 pg/mL
Vitamin D3 1, 25 (OH)2: 40 pg/mL

## 2011-04-27 NOTE — Assessment & Plan Note (Signed)
Controlled, no change in medication  

## 2011-04-27 NOTE — Assessment & Plan Note (Signed)
Moderately severe with a lot of overlying anxiety. Pt to see a therapist and will be established with psychiatry as soon as possible, she is to start anti  depresant med.also Work excuse granted for a few days due to reported symptoms and her general state at presentation

## 2011-04-27 NOTE — Assessment & Plan Note (Signed)
acite sinusitis, and bronchitis, tessalon perles and a z pack prescribed

## 2011-04-27 NOTE — Assessment & Plan Note (Signed)
Sub-optimal control, followed by endocrine

## 2011-05-10 ENCOUNTER — Ambulatory Visit (HOSPITAL_COMMUNITY): Payer: Commercial Managed Care - PPO | Admitting: Physician Assistant

## 2011-05-10 DIAGNOSIS — F4389 Other reactions to severe stress: Secondary | ICD-10-CM

## 2011-05-10 DIAGNOSIS — F438 Other reactions to severe stress: Secondary | ICD-10-CM

## 2011-05-22 ENCOUNTER — Other Ambulatory Visit: Payer: Self-pay | Admitting: Family Medicine

## 2011-06-13 ENCOUNTER — Encounter (HOSPITAL_COMMUNITY): Payer: 59 | Admitting: Physician Assistant

## 2011-07-03 ENCOUNTER — Other Ambulatory Visit: Payer: Self-pay | Admitting: Family Medicine

## 2011-07-04 ENCOUNTER — Telehealth: Payer: Self-pay | Admitting: Family Medicine

## 2011-07-04 MED ORDER — FLUCONAZOLE 150 MG PO TABS: 150.0000 mg | ORAL_TABLET | Freq: Once | ORAL | Status: AC

## 2011-07-04 NOTE — Telephone Encounter (Signed)
Medication sent, patient aware.  

## 2011-07-04 NOTE — Telephone Encounter (Signed)
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

## 2011-07-04 NOTE — Telephone Encounter (Signed)
Vaginal itching only, no discharge, irritation, or odor

## 2011-07-27 ENCOUNTER — Other Ambulatory Visit: Payer: Self-pay | Admitting: Family Medicine

## 2011-08-21 ENCOUNTER — Encounter: Payer: Self-pay | Admitting: Family Medicine

## 2011-08-23 ENCOUNTER — Ambulatory Visit (INDEPENDENT_AMBULATORY_CARE_PROVIDER_SITE_OTHER): Payer: 59 | Admitting: Family Medicine

## 2011-08-23 ENCOUNTER — Other Ambulatory Visit: Payer: Self-pay | Admitting: Family Medicine

## 2011-08-23 ENCOUNTER — Encounter: Payer: Self-pay | Admitting: Family Medicine

## 2011-08-23 VITALS — BP 120/70 | HR 95 | Resp 16 | Ht 64.5 in | Wt 283.0 lb

## 2011-08-23 DIAGNOSIS — E669 Obesity, unspecified: Secondary | ICD-10-CM

## 2011-08-23 DIAGNOSIS — E109 Type 1 diabetes mellitus without complications: Secondary | ICD-10-CM

## 2011-08-23 DIAGNOSIS — I10 Essential (primary) hypertension: Secondary | ICD-10-CM

## 2011-08-23 DIAGNOSIS — E119 Type 2 diabetes mellitus without complications: Secondary | ICD-10-CM

## 2011-08-23 DIAGNOSIS — E785 Hyperlipidemia, unspecified: Secondary | ICD-10-CM

## 2011-08-23 DIAGNOSIS — R002 Palpitations: Secondary | ICD-10-CM

## 2011-08-23 DIAGNOSIS — R079 Chest pain, unspecified: Secondary | ICD-10-CM

## 2011-08-23 MED ORDER — GLUCOSE BLOOD VI STRP: ORAL_STRIP | Status: DC

## 2011-08-23 NOTE — Patient Instructions (Signed)
F/u in 3 months.  You are refered for cardiology evaluation for new palpitations and chest pain.  hBA1c and chem 7 today.  Start oTC potassium 99mg  tablets take two daily until you get the prescription, and then resume the script

## 2011-08-23 NOTE — Progress Notes (Signed)
  Subjective:    [name], female    DOB: 12/12/1960, 50 y.o.   MRN: [id number]  HPI 1 week h/o palpitations intermittent tingling in left upper extremity also increased fatigue , was having cramps in her hands. Has been out of potassium for 3 weeks. 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 Reports excellent blood sugar control, with fasting sugars seldom over 120. No progress is being made with weight loss      Review of Systems See HPI Denies recent fever or chills. Denies sinus pressure, nasal congestion, ear pain or sore throat. Denies chest congestion, productive cough or wheezing. Denies PND, orthopnea and leg swelling Denies abdominal pain, nausea, vomiting,diarrhea or constipation.   Denies dysuria, frequency, hesitancy or incontinence. Denies joint pain, swelling and limitation in mobility. Denies headaches, seizures, numbness, or tingling. Denies uncontrolled  depression, anxiety or insomnia. Denies skin break down or rash.        Objective:   Physical Exam Patient alert and oriented and in no cardiopulmonary distress.  HEENT: No facial asymmetry, EOMI, no sinus tenderness,  oropharynx pink and moist.  Neck supple no adenopathy.  Chest: Clear to auscultation bilaterally.  CVS: S1, S2 no murmurs, no S3.  ABD: Soft non tender. Bowel sounds normal.  Ext: No edema  MS: Adequate ROM spine, shoulders, hips and knees.  Skin: Intact, no ulcerations or rash noted.  Psych: Good eye contact, normal affect. Memory intact, mildly anxious not depressed appearing.  CNS: CN 2-12 intact, power, tone and sensation normal throughout.        Assessment & Plan:

## 2011-08-24 LAB — BASIC METABOLIC PANEL
BUN: 15 mg/dL (ref 6–23)
CO2: 24 mEq/L (ref 19–32)
Calcium: 8.9 mg/dL (ref 8.4–10.5)
Chloride: 104 mEq/L (ref 96–112)
Creat: 0.78 mg/dL (ref 0.50–1.10)
Glucose, Bld: 79 mg/dL (ref 70–99)
Potassium: 4.1 mEq/L (ref 3.5–5.3)
Sodium: 140 mEq/L (ref 135–145)

## 2011-08-24 LAB — TROPONIN I: Troponin I: <0.01 ng/mL (ref <0.06)

## 2011-08-24 LAB — HEMOGLOBIN A1C
Hgb A1c MFr Bld: 7.9 % — ABNORMAL HIGH (ref <5.7)
Mean Plasma Glucose: 180 mg/dL — ABNORMAL HIGH (ref <117)

## 2011-08-24 NOTE — Assessment & Plan Note (Signed)
Deteriorated. Patient re-educated about  the importance of commitment to a  minimum of 150 minutes of exercise per week. The importance of healthy food choices with portion control discussed. Encouraged to start a food diary, count calories and to consider  joining a support group. Sample diet sheets offered. Goals set by the patient for the next several months.    

## 2011-08-24 NOTE — Assessment & Plan Note (Signed)
New onset palpitations with fatigue. Office EKG shows PAC, no evidence of ischemia, pt reassured of benign appearing tracing, however due multiple cardiovascular risk factors she is being referred for evaluation by cardiology. Some of her symptoms may be due to low potassium

## 2011-08-24 NOTE — Assessment & Plan Note (Signed)
Controlled, no change in medication  

## 2011-08-24 NOTE — Assessment & Plan Note (Signed)
Deteriorated, pt is followed by endocrine, results have been forwarded, I believe diet is a big factor here, however med adjustment may also be warranted

## 2011-08-25 ENCOUNTER — Other Ambulatory Visit: Payer: Self-pay | Admitting: *Deleted

## 2011-08-25 MED ORDER — GLUCOSE BLOOD VI STRP: ORAL_STRIP | Status: AC

## 2011-08-29 ENCOUNTER — Telehealth: Payer: Self-pay

## 2011-08-30 ENCOUNTER — Other Ambulatory Visit: Payer: Self-pay | Admitting: Family Medicine

## 2011-08-30 DIAGNOSIS — Z139 Encounter for screening, unspecified: Secondary | ICD-10-CM

## 2011-09-05 NOTE — Telephone Encounter (Signed)
Unable to reach patient by phone- left message

## 2011-09-07 ENCOUNTER — Ambulatory Visit: Payer: 59 | Admitting: Cardiology

## 2011-09-07 ENCOUNTER — Telehealth: Payer: Self-pay | Admitting: Family Medicine

## 2011-09-08 MED ORDER — FUROSEMIDE 20 MG PO TABS: 20.0000 mg | ORAL_TABLET | Freq: Two times a day (BID) | ORAL | Status: DC

## 2011-09-08 NOTE — Telephone Encounter (Signed)
Sent in

## 2011-09-14 ENCOUNTER — Encounter: Payer: Self-pay | Admitting: Cardiology

## 2011-09-18 ENCOUNTER — Ambulatory Visit (INDEPENDENT_AMBULATORY_CARE_PROVIDER_SITE_OTHER): Payer: 59 | Admitting: Cardiology

## 2011-09-18 ENCOUNTER — Encounter: Payer: Self-pay | Admitting: Cardiology

## 2011-09-18 VITALS — BP 143/80 | HR 82 | Wt 285.0 lb

## 2011-09-18 DIAGNOSIS — E785 Hyperlipidemia, unspecified: Secondary | ICD-10-CM

## 2011-09-18 DIAGNOSIS — R609 Edema, unspecified: Secondary | ICD-10-CM

## 2011-09-18 DIAGNOSIS — E669 Obesity, unspecified: Secondary | ICD-10-CM

## 2011-09-18 DIAGNOSIS — R002 Palpitations: Secondary | ICD-10-CM

## 2011-09-18 NOTE — Assessment & Plan Note (Signed)
20 pound weight gain reported over the past 12 months.  Weight loss and increased exercise recommended.

## 2011-09-18 NOTE — Assessment & Plan Note (Signed)
Good response to relatively low dose furosemide.  Teaching regarding a low-salt diet provided.  Patient advised to use furosemide 20-40 mg per day on an as-needed basis and to take potassium only on the days she requires diuretic.

## 2011-09-18 NOTE — Patient Instructions (Addendum)
Your physician recommends that you schedule a follow-up appointment in: 1 month  Low Salt Diet  Your physician has recommended you make the following change in your medication:   Use lasix 20-40 mg as needed and take KCL (potassium) only on the days when lasix is used  Your physician has recommended that you wear an event monitor. Event monitors are medical devices that record the heart's electrical activity. Doctors most often [place] these monitors to diagnose arrhythmias. Arrhythmias are problems with the speed or rhythm of the heartbeat. The monitor is a small, portable device. You can wear one while you do your normal daily activities. This is usually used to diagnose what is causing palpitations/syncope (passing out).

## 2011-09-18 NOTE — Assessment & Plan Note (Signed)
Palpitations likely represent a benign arrhythmia, possibly the PACs that have already been diagnosed.  To further investigate her apparent arrhythmia, event recording will be performed for 3 weeks.  I will reassess this nice woman in one month.

## 2011-09-18 NOTE — Progress Notes (Signed)
HPI: Kendra Hanson is seen at the kind request of Dr. Lodema Hong for evaluation and treatment of palpitations.  This nice woman was originally evaluated by Dr. Daleen Squibb in 2006 and subsequently seen by Dr. Domingo Sep for a number of years.  Symptoms have been modest and intermittent.  Cardiology testing in the past has been as a result of multiple cardiovascular risk factors including a strong family history.  The most recent negative stress nuclear study available to me was performed in 2006.  Records from [facility] has been requested.   The patient has recently noted intermittent palpitations.  These are not terribly concerning to her.  She notes a sense of anxiety at night when she retires and has subjective tachycardia, but her pulse rate is not increased.  She has had some muscle pain and paresthesias in her left arm, but these resolved after hypokalemia was corrected.  Patient has had some pedal edema, initially controlled with a total of 40 mg per day furosemide, but more recently with 20 mg.  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  Current Outpatient Prescriptions on File Prior to Visit  Medication Sig Dispense Refill  . aspirin 81 MG tablet Take 81 mg by mouth daily.        . benzonatate (TESSALON PERLES) 100 MG capsule Take 1 capsule (100 mg total) by mouth every 6 (six) hours as needed for cough.  30 capsule  0  . CRESTOR 20 MG tablet TAKE 1 TABLET BY MOUTH DAILY AT BEDTIME  90 tablet  0  . diltiazem (TIAZAC) 360 MG 24 hr capsule TAKE 1 CAPSULE BY MOUTH ONCE DAILY  90 capsule  1  . Exenatide (BYETTA 10 MCG PEN [place]) Inject 10 Units into the skin daily.        . fexofenadine (ALLEGRA) 180 MG tablet Take 180 mg by mouth daily.        . furosemide (LASIX) 20 MG tablet Take 1 tablet (20 mg total) by mouth 2 (two) times daily.  180 tablet  1  . glucose blood  (ACCU-CHEK COMPACT STRIPS) test strip Use as instructed for three times daily testing with Accuchek Compact plus meter  270 each  12  . insulin aspart (NOVOLOG FLEXPEN) 100 UNIT/ML injection Inject into the skin 3 (three) times daily before meals. 6 in am, 6 at lunch and 12 at night       . Insulin Glargine (LANTUS SOLOSTAR [place]) Inject 52 Units into the skin at bedtime.        Marland Kitchen KLOR-CON M10 10 MEQ tablet TAKE 2 TABLETS BY MOUTH ONCE DAILY  180 tablet  1  . losartan (COZAAR) 100 MG tablet Take 100 mg by mouth daily.        . metFORMIN (GLUCOPHAGE) 1000 MG tablet Take 1,000 mg by mouth 2 (two) times daily with a meal.        . potassium chloride (KLOR-CON) 10 MEQ CR tablet 10 mEq. Three tabs daily         Not on File    Past Medical History  Diagnosis Date  . Insulin dependent diabetes mellitus 1996  . Hypertension 2008    Normal CBC and CMet in 2012; negative stress nuclear in 2006- patient asymptomatic  . Obesity   . Hyperlipidemia 2008    Lipid profile in 04/2011:136, 53, 43  .  Multiple allergies     perennial  . Fibroids     Uterine     Past Surgical History  Procedure Date  . Refractive surgery 2011    Bilateral, two seperate occasions first in 2006  . Retinal detachment surgery 05/2005     Family History  Problem Relation Age of Onset  . Hypertension Mother   . Diabetes Mother   . Kidney failure Mother   . Stroke Mother   . Kidney failure Father   . Diabetes Father   . Heart attack Father   . COPD Sister   . Diabetes Brother   . Diabetes Brother   . Diabetes Brother   . Diabetes Brother   . Diabetes Brother   . Hypertension Brother      History   Social History  . Marital Status: Married    Spouse Name: N/A    Number of Children: N/A  . Years of Education: N/A   Occupational History  . Employed [employer]   Social History Main Topics  . Smoking status: Never Smoker   . Smokeless tobacco: Not on file  . Alcohol Use: No  . Drug Use: No  . Sexually  Active: Not on file   Other Topics Concern  . Not on file   Social History Narrative   Married with 2 children     ROS:  Patient denies chest discomfort, dyspnea, orthopnea or PND.  Vision is somewhat impaired, but stable.  Perimenopausal.  Requires corrective lenses.  Stable but substantially elevated weight; no change in appetite.   All other systems reviewed and are negative.  PHYSICAL EXAM: BP 143/80  Pulse 82  Wt 129.275 kg (285 lb)  General-Well-developed; no acute distress Body Habitus-overweight HEENT-[place]/AT; PERRL; EOM intact; conjunctiva and lids nl Neck-No JVD; no carotid bruits Endocrine-No thyromegaly Lungs-Clear lung fields; resonant percussion; normal I-to-E ratio Cardiovascular- normal PMI; distant S1 and S2 Abdomen-BS normal; soft and non-tender without masses or organomegaly Musculoskeletal-No deformities, cyanosis or clubbing Neurologic-Nl cranial nerves; symmetric strength and tone Skin- Warm, no significant lesions Extremities-Nl distal pulses; trace edema  EKG:  Performed 08/23/11-Normal sinus rhythm with PACs; prominent QRS voltage; otherwise normal.  No previous tracing for comparison  ASSESSMENT AND PLAN:

## 2011-09-18 NOTE — Assessment & Plan Note (Signed)
Lipids are well controlled with current therapy.

## 2011-09-27 DIAGNOSIS — R002 Palpitations: Secondary | ICD-10-CM

## 2011-09-28 ENCOUNTER — Telehealth: Payer: Self-pay | Admitting: Family Medicine

## 2011-09-28 ENCOUNTER — Encounter: Payer: Self-pay | Admitting: Family Medicine

## 2011-09-29 NOTE — Telephone Encounter (Signed)
I totally agree.

## 2011-09-29 NOTE — Telephone Encounter (Signed)
Called and left message, based on her symptoms she should call and mke appt for Monday that we did not call in antibiotics without OV. To call today if possible before all appts are taken

## 2011-10-02 ENCOUNTER — Other Ambulatory Visit: Payer: Self-pay | Admitting: Family Medicine

## 2011-10-11 ENCOUNTER — Other Ambulatory Visit: Payer: Self-pay | Admitting: Family Medicine

## 2011-10-11 MED ORDER — EXENATIDE 10 MCG/0.04ML ~~LOC~~ SOPN: PEN_INJECTOR | SUBCUTANEOUS | Status: DC

## 2011-10-11 MED ORDER — ROSUVASTATIN CALCIUM 20 MG PO TABS: ORAL_TABLET | ORAL | Status: DC

## 2011-10-11 NOTE — Telephone Encounter (Signed)
Called patient at work, unable to come to the phone. All sent to [facility]

## 2011-10-11 NOTE — Telephone Encounter (Signed)
pls call pt to collect or fax in the scripts written as requested for testing supplies, Also pls refill her byetta and crestor x 3 and let her know

## 2011-10-19 ENCOUNTER — Encounter: Payer: Self-pay | Admitting: Cardiology

## 2011-10-25 ENCOUNTER — Encounter (INDEPENDENT_AMBULATORY_CARE_PROVIDER_SITE_OTHER): Payer: 59 | Admitting: Ophthalmology

## 2011-10-25 DIAGNOSIS — H35039 Hypertensive retinopathy, unspecified eye: Secondary | ICD-10-CM

## 2011-10-25 DIAGNOSIS — H2510 Age-related nuclear cataract, unspecified eye: Secondary | ICD-10-CM

## 2011-10-25 DIAGNOSIS — H43819 Vitreous degeneration, unspecified eye: Secondary | ICD-10-CM

## 2011-10-25 DIAGNOSIS — E1165 Type 2 diabetes mellitus with hyperglycemia: Secondary | ICD-10-CM

## 2011-10-25 DIAGNOSIS — I10 Essential (primary) hypertension: Secondary | ICD-10-CM

## 2011-10-25 DIAGNOSIS — E1139 Type 2 diabetes mellitus with other diabetic ophthalmic complication: Secondary | ICD-10-CM

## 2011-10-25 DIAGNOSIS — H4310 Vitreous hemorrhage, unspecified eye: Secondary | ICD-10-CM

## 2011-10-25 DIAGNOSIS — E11359 Type 2 diabetes mellitus with proliferative diabetic retinopathy without macular edema: Secondary | ICD-10-CM

## 2011-10-30 ENCOUNTER — Encounter: Payer: Self-pay | Admitting: Cardiology

## 2011-10-30 ENCOUNTER — Ambulatory Visit (INDEPENDENT_AMBULATORY_CARE_PROVIDER_SITE_OTHER): Payer: 59 | Admitting: Cardiology

## 2011-10-30 ENCOUNTER — Ambulatory Visit (HOSPITAL_COMMUNITY)
Admission: RE | Admit: 2011-10-30 | Discharge: 2011-10-30 | Disposition: A | Discharge status: Home / Self Care | Payer: 59 | Source: Ambulatory Visit | Attending: Family Medicine | Admitting: Family Medicine

## 2011-10-30 DIAGNOSIS — Z1231 Encounter for screening mammogram for malignant neoplasm of breast: Secondary | ICD-10-CM | POA: Insufficient documentation

## 2011-10-30 DIAGNOSIS — Z139 Encounter for screening, unspecified: Secondary | ICD-10-CM

## 2011-10-30 DIAGNOSIS — I10 Essential (primary) hypertension: Secondary | ICD-10-CM

## 2011-10-30 DIAGNOSIS — R002 Palpitations: Secondary | ICD-10-CM

## 2011-10-30 DIAGNOSIS — N912 Amenorrhea, unspecified: Secondary | ICD-10-CM

## 2011-10-30 MED ORDER — METOPROLOL SUCCINATE ER 100 MG PO TB24: ORAL_TABLET | ORAL | Status: DC

## 2011-10-30 NOTE — Progress Notes (Signed)
[name], female   DOB: 03/18/1961, 50 y.o.   MRN: [id number] HPI: [name] returns to the office as scheduled for continued assessment and treatment of palpitations.  Since her last visit, her sensations of rapid and at times irregular heart beating have essentially disappeared.  She attributes this to a decrease in caffeine intake.  She carried an event recorder for 3 weeks, but reported no symptoms.  No arrhythmias were identified other than PACs, which appeared to be frequent, at least at times.  Prior to Admission medications   Medication Sig Start Date End Date Taking? Authorizing Provider  aspirin 81 MG tablet Take 81 mg by mouth daily.     Yes Historical Provider, MD  benzonatate (TESSALON PERLES) 100 MG capsule Take 1 capsule (100 mg total) by mouth every 6 (six) hours as needed for cough. 04/18/11 04/17/12 Yes Syliva Overman, MD  diltiazem (TIAZAC) 360 MG 24 hr capsule TAKE 1 CAPSULE BY MOUTH ONCE DAILY 05/22/11  Yes Syliva Overman, MD  exenatide (BYETTA 10 MCG PEN) 10 MCG/0.04ML SOLN 10 units daily Dispense 3 month supply 10/11/11  Yes Syliva Overman, MD  fexofenadine (ALLEGRA) 180 MG tablet Take 180 mg by mouth daily.     Yes Historical Provider, MD  furosemide (LASIX) 20 MG tablet Take 1 tablet (20 mg total) by mouth 2 (two) times daily. 09/08/11  Yes Syliva Overman, MD  glucose blood (ACCU-CHEK COMPACT STRIPS) test strip Use as instructed for three times daily testing with Accuchek Compact plus meter 08/25/11 08/24/12 Yes Syliva Overman, MD  insulin aspart (NOVOLOG FLEXPEN) 100 UNIT/ML injection Inject into the skin 3 (three) times daily before meals. 6 in am, 6 at lunch and 12 at night    Yes Historical Provider, MD  Insulin Glargine (LANTUS SOLOSTAR [place]) Inject 52 Units into the skin at bedtime.     Yes Historical Provider, MD  KLOR-CON M10 10 MEQ tablet TAKE 2 TABLETS BY MOUTH ONCE DAILY 07/27/11  Yes Syliva Overman, MD  losartan (COZAAR) 100 MG tablet Take  100 mg by mouth daily.     Yes Historical Provider, MD  metFORMIN (GLUCOPHAGE) 1000 MG tablet Take 1,000 mg by mouth 2 (two) times daily with a meal.     Yes Historical Provider, MD  potassium chloride (KLOR-CON) 10 MEQ CR tablet 10 mEq. Three tabs daily    Yes Historical Provider, MD  rosuvastatin (CRESTOR) 20 MG tablet TAKE 1 TABLET BY MOUTH DAILY AT BEDTIME 10/11/11  Yes Syliva Overman, MD  metoprolol (TOPROL-XL) 100 MG 24 hr tablet 1/2 tablet by mouth daily 10/30/11   Kendra Hanson. Kendra Pates, MD  Past medical history, social history, and family history reviewed and updated.  ROS: No dyspnea, chest discomfort, orthopnea, PND, lightheadedness or syncope.  PHYSICAL EXAM: BP 157/80  Pulse 90  Wt 128.822 kg (284 lb)   General-Well developed; no acute distress Body habitus-obese Neck-No JVD; no carotid bruits Lungs-clear lung fields; resonant to percussion Cardiovascular-normal PMI; normal S1 and S2; S4 present; minimal basilar systolic murmur Abdomen-normal bowel sounds; soft and non-tender without masses or organomegaly Musculoskeletal-No deformities, no cyanosis or clubbing Neurologic-Normal cranial nerves; symmetric strength and tone Skin-Warm, no significant lesions   ASSESSMENT AND PLAN:  [name], MD 10/30/2011 2:05 PM

## 2011-10-30 NOTE — Assessment & Plan Note (Signed)
Blood pressure has been suboptimal with a goal value of 135/85 or less.  She would benefit from additional antihypertensive medication.  Current drugs are at maximal dose so that another agent will be required as discussed below.

## 2011-10-30 NOTE — Patient Instructions (Signed)
Your physician recommends that you schedule a follow-up appointment in: As needed  Your physician has recommended you make the following change in your medication: Toprol xl 100mg  1/2 tablet daily

## 2011-10-30 NOTE — Assessment & Plan Note (Signed)
Symptoms have virtually resolved either with the passage of time or with a decrease in Kendra Hanson's caffeine intake.  Event recorder revealed no significant arrhythmias and no reported symptoms.  Frequent PACs were detected.  Although patient does not really appear to require treatment at present, she has been referred to cardiology on a number of occasions in the past as a result of these symptoms.  Since she requires additional antihypertensive medication, a beta blocker may serve 2 purposes for her, lowering of blood pressure and relief of palpitations.  She will start sustained release metoprolol at a dose of 50 mg per day and followup with Dr. Lodema Hanson as planned in early January.  I will be happy to reevaluate this for a nice woman in the future as necessary.

## 2011-11-01 ENCOUNTER — Encounter: Payer: Self-pay | Admitting: Cardiology

## 2011-11-01 ENCOUNTER — Other Ambulatory Visit: Payer: Self-pay | Admitting: Family Medicine

## 2011-11-01 NOTE — Telephone Encounter (Signed)
[email or web address]

## 2011-11-01 NOTE — Telephone Encounter (Signed)
Do you want to refill? 

## 2011-11-02 ENCOUNTER — Encounter (INDEPENDENT_AMBULATORY_CARE_PROVIDER_SITE_OTHER): Payer: 59 | Admitting: Ophthalmology

## 2011-11-08 ENCOUNTER — Other Ambulatory Visit: Payer: Self-pay | Admitting: Cardiology

## 2011-11-08 DIAGNOSIS — R002 Palpitations: Secondary | ICD-10-CM

## 2011-11-20 ENCOUNTER — Other Ambulatory Visit: Payer: Self-pay | Admitting: Family Medicine

## 2011-11-22 ENCOUNTER — Other Ambulatory Visit: Payer: Self-pay

## 2011-11-22 MED ORDER — GLUCOSE BLOOD VI STRP: ORAL_STRIP | Status: AC

## 2011-11-22 MED ORDER — LOSARTAN POTASSIUM 100 MG PO TABS: 100.0000 mg | ORAL_TABLET | Freq: Every day | ORAL | Status: DC

## 2011-11-22 MED ORDER — ONETOUCH VERIO IQ SYSTEM W/DEVICE KIT: 1.0000 | PACK | Freq: Once | Status: DC

## 2011-11-22 MED ORDER — DILTIAZEM HCL ER BEADS 360 MG PO CP24: ORAL_CAPSULE | ORAL | Status: DC

## 2011-11-22 MED ORDER — LANCET DEVICE MISC: Status: DC

## 2011-11-23 ENCOUNTER — Ambulatory Visit: Payer: 59 | Admitting: Family Medicine

## 2011-11-23 ENCOUNTER — Other Ambulatory Visit: Payer: Self-pay | Admitting: Family Medicine

## 2011-11-23 MED ORDER — EXENATIDE 10 MCG/0.04ML ~~LOC~~ SOPN: 10.0000 ug | PEN_INJECTOR | Freq: Two times a day (BID) | SUBCUTANEOUS | Status: DC

## 2011-11-30 ENCOUNTER — Encounter: Payer: Self-pay | Admitting: Family Medicine

## 2011-11-30 ENCOUNTER — Ambulatory Visit: Payer: 59 | Admitting: Family Medicine

## 2011-11-30 ENCOUNTER — Ambulatory Visit (INDEPENDENT_AMBULATORY_CARE_PROVIDER_SITE_OTHER): Payer: 59 | Admitting: Family Medicine

## 2011-11-30 DIAGNOSIS — I10 Essential (primary) hypertension: Secondary | ICD-10-CM

## 2011-11-30 DIAGNOSIS — E109 Type 1 diabetes mellitus without complications: Secondary | ICD-10-CM

## 2011-11-30 DIAGNOSIS — E785 Hyperlipidemia, unspecified: Secondary | ICD-10-CM

## 2011-11-30 DIAGNOSIS — E669 Obesity, unspecified: Secondary | ICD-10-CM

## 2011-11-30 DIAGNOSIS — R002 Palpitations: Secondary | ICD-10-CM

## 2011-11-30 NOTE — Assessment & Plan Note (Signed)
Deteriorated. Patient re-educated about  the importance of commitment to a  minimum of 150 minutes of exercise per week. The importance of healthy food choices with portion control discussed. Encouraged to start a food diary, count calories and to consider  joining a support group. Sample diet sheets offered. Goals set by the patient for the next several months.    

## 2011-11-30 NOTE — Assessment & Plan Note (Signed)
Uncontrolled,pt to see endocrinologist

## 2011-11-30 NOTE — Patient Instructions (Addendum)
F/U in 3 month  Weight loss goal of on average 3 pounds per month.  Please use the app "My fitness pal" this is free available on your phone.It will coaunt your calories.  We will provide a 1600 cal diet sheet  Lipid, cmp and egfr fasting in 3 month  Check on tetanus status   It is important that you exercise regularly at least 30 minutes 5 times a week. If you develop chest pain, have severe difficulty breathing, or feel very tired, stop exercising immediately and seek medical attention   A healthy diet is rich in fruit, vegetables and whole grains. Poultry fish, nuts and beans are a healthy choice for protein rather then red meat. A low sodium diet and drinking 64 ounces of water daily is generally recommended. Oils and sweet should be limited. Carbohydrates especially for those who are diabetic or overweight, should be limited to 34-45 gram per meal. It is important to eat on a regular schedule, at least 3 times daily. Snacks should be primarily fruits, vegetables or nuts.  You need to sched an appt with Dr Lucianne Muss, blood sugar is still too high  Eliminate sweets at home Stop sweet drinks and juces. Drink water.  Eat over a 12 hour period

## 2011-11-30 NOTE — Assessment & Plan Note (Signed)
Controlled, no change in medication  

## 2011-12-03 NOTE — Assessment & Plan Note (Signed)
Has had cardiology assesment , wore event monitor, nothing of concern

## 2011-12-03 NOTE — Progress Notes (Signed)
  Subjective:    [name], female    DOB: [birth year], 51 y.o.   MRN: [id number]  HPI The PT is here for follow up and re-evaluation of chronic medical conditions, medication management and review of any available recent lab and radiology data.  Preventive health is updated, specifically  Cancer screening and Immunization.   Questions or concerns regarding consultations or procedures which the PT has had in the interim are  addressed. The PT denies any adverse reactions to current medications since the last visit.  There are no new concerns.  There are no specific complaints      Review of Systems See HPI Denies recent fever or chills. Denies sinus pressure, nasal congestion, ear pain or sore throat. Denies chest congestion, productive cough or wheezing. Denies chest pains, palpitations and leg swelling Denies abdominal pain, nausea, vomiting,diarrhea or constipation.   Denies dysuria, frequency, hesitancy or incontinence. Denies joint pain, swelling and limitation in mobility. Denies headaches, seizures, numbness, or tingling. Denies depression, anxiety or insomnia. Denies skin break down or rash.        Objective:   Physical Exam  Patient alert and oriented and in no cardiopulmonary distress.  HEENT: No facial asymmetry, EOMI, no sinus tenderness,  oropharynx pink and moist.  Neck supple no adenopathy.  Chest: Clear to auscultation bilaterally.  CVS: S1, S2 no murmurs, no S3.  ABD: Soft non tender. Bowel sounds normal.  Ext: No edema  MS: Adequate ROM spine, shoulders, hips and knees.  Skin: Intact, no ulcerations or rash noted.  Psych: Good eye contact, normal affect. Memory intact not anxious or depressed appearing.  CNS: CN 2-12 intact, power, tone and sensation normal throughout.       Assessment & Plan:

## 2011-12-27 ENCOUNTER — Encounter (INDEPENDENT_AMBULATORY_CARE_PROVIDER_SITE_OTHER): Payer: 59 | Admitting: Ophthalmology

## 2011-12-29 ENCOUNTER — Encounter (INDEPENDENT_AMBULATORY_CARE_PROVIDER_SITE_OTHER): Payer: 59 | Admitting: Ophthalmology

## 2012-01-01 ENCOUNTER — Encounter (INDEPENDENT_AMBULATORY_CARE_PROVIDER_SITE_OTHER): Payer: 59 | Admitting: Ophthalmology

## 2012-01-19 ENCOUNTER — Encounter (INDEPENDENT_AMBULATORY_CARE_PROVIDER_SITE_OTHER): Payer: 59 | Admitting: Ophthalmology

## 2012-01-19 DIAGNOSIS — E1039 Type 1 diabetes mellitus with other diabetic ophthalmic complication: Secondary | ICD-10-CM

## 2012-01-19 DIAGNOSIS — H4310 Vitreous hemorrhage, unspecified eye: Secondary | ICD-10-CM

## 2012-01-19 DIAGNOSIS — E11359 Type 2 diabetes mellitus with proliferative diabetic retinopathy without macular edema: Secondary | ICD-10-CM

## 2012-01-19 DIAGNOSIS — E1065 Type 1 diabetes mellitus with hyperglycemia: Secondary | ICD-10-CM

## 2012-02-08 ENCOUNTER — Other Ambulatory Visit: Payer: Self-pay | Admitting: Family Medicine

## 2012-02-19 ENCOUNTER — Other Ambulatory Visit: Payer: Self-pay | Admitting: Family Medicine

## 2012-02-24 LAB — COMPLETE METABOLIC PANEL WITH GFR
ALT: 11 U/L (ref 0–35)
AST: 13 U/L (ref 0–37)
Albumin: 4.2 g/dL (ref 3.5–5.2)
Alkaline Phosphatase: 96 U/L (ref 39–117)
BUN: 10 mg/dL (ref 6–23)
CO2: 22 mEq/L (ref 19–32)
Calcium: 8.8 mg/dL (ref 8.4–10.5)
Chloride: 106 mEq/L (ref 96–112)
Creat: 0.78 mg/dL (ref 0.50–1.10)
GFR, Est African American: >89 mL/min
GFR, Est Non African American: 88 mL/min
Glucose, Bld: 48 mg/dL — ABNORMAL LOW (ref 70–99)
Potassium: 4.2 mEq/L (ref 3.5–5.3)
Sodium: 140 mEq/L (ref 135–145)
Total Bilirubin: 0.5 mg/dL (ref 0.3–1.2)
Total Protein: 6.8 g/dL (ref 6.0–8.3)

## 2012-02-24 LAB — LIPID PANEL
Cholesterol: 135 mg/dL (ref 0–200)
HDL: 44 mg/dL (ref >39)
LDL Cholesterol: 82 mg/dL (ref 0–99)
Total CHOL/HDL Ratio: 3.1 Ratio
Triglycerides: 47 mg/dL (ref <150)
VLDL: 9 mg/dL (ref 0–40)

## 2012-02-29 ENCOUNTER — Encounter: Payer: Self-pay | Admitting: Family Medicine

## 2012-02-29 ENCOUNTER — Ambulatory Visit (INDEPENDENT_AMBULATORY_CARE_PROVIDER_SITE_OTHER): Payer: 59 | Admitting: Family Medicine

## 2012-02-29 VITALS — BP 120/64 | HR 74 | Resp 16 | Ht 64.5 in | Wt 286.0 lb

## 2012-02-29 DIAGNOSIS — E109 Type 1 diabetes mellitus without complications: Secondary | ICD-10-CM

## 2012-02-29 DIAGNOSIS — I10 Essential (primary) hypertension: Secondary | ICD-10-CM

## 2012-02-29 DIAGNOSIS — E669 Obesity, unspecified: Secondary | ICD-10-CM

## 2012-02-29 DIAGNOSIS — E785 Hyperlipidemia, unspecified: Secondary | ICD-10-CM

## 2012-02-29 NOTE — Patient Instructions (Addendum)
F/u in 3 month  hBA1C  Today  It is important that you exercise regularly at least 30 minutes 5 times a week. If you develop chest pain, have severe difficulty breathing, or feel very tired, stop exercising immediately and seek medical attention    Current is lantus 52 units,I will call in am with new dose

## 2012-02-29 NOTE — Progress Notes (Signed)
  Subjective:    [name], female    DOB: [birth year], 51 y.o.   MRN: [id number]  HPI The PT is here for follow up and re-evaluation of chronic medical conditions, medication management and review of any available recent lab and radiology data.  Preventive health is updated, specifically  Cancer screening and Immunization.   Questions or concerns regarding consultations or procedures which the PT has had in the interim are  addressed. The PT denies any adverse reactions to current medications since the last visit.  There are no new concerns.  C/o persistent foot pain which limits her ability to exercise, attempting to change footwear to see if this improves this Denies polyuria, polydipsia or blurred vision, fasting blood sugars average around 135 to 140    Review of Systems See HPI Denies recent fever or chills. Denies sinus pressure, nasal congestion, ear pain or sore throat. Denies chest congestion, productive cough or wheezing. Denies chest pains, palpitations and leg swelling Denies abdominal pain, nausea, vomiting,diarrhea or constipation.   Denies dysuria, frequency, hesitancy or incontinence. Denies joint pain, swelling and limitation in mobility. Denies headaches, seizures, numbness, or tingling. Denies depression, anxiety or insomnia. Denies skin break down or rash.        Objective:   Physical Exam Patient alert and oriented and in no cardiopulmonary distress.  HEENT: No facial asymmetry, EOMI, no sinus tenderness,  oropharynx pink and moist.  Neck supple no adenopathy.  Chest: Clear to auscultation bilaterally.  CVS: S1, S2 no murmurs, no S3.  ABD: Soft non tender. Bowel sounds normal.  Ext: No edema  MS: Adequate ROM spine, shoulders, hips and knees.  Skin: Intact, no ulcerations or rash noted.  Psych: Good eye contact, normal affect. Memory intact not anxious or depressed appearing.  CNS: CN 2-12 intact, power, tone and sensation  normal throughout.        Assessment & Plan:

## 2012-03-01 LAB — HEMOGLOBIN A1C
Hgb A1c MFr Bld: 7.4 % — ABNORMAL HIGH (ref <5.7)
Mean Plasma Glucose: 166 mg/dL — ABNORMAL HIGH (ref <117)

## 2012-03-01 MED ORDER — INSULIN GLARGINE 100 UNIT/ML ~~LOC~~ SOLN: 60.0000 [IU] | Freq: Every day | SUBCUTANEOUS | Status: DC

## 2012-03-02 NOTE — Assessment & Plan Note (Signed)
Controlled, no change in medication  

## 2012-03-02 NOTE — Assessment & Plan Note (Signed)
Improved, though not at goal, dose increase in lantus, also increased exercise and reduced carb

## 2012-03-02 NOTE — Assessment & Plan Note (Signed)
Unchanged. Patient re-educated about  the importance of commitment to a  minimum of 150 minutes of exercise per week. The importance of healthy food choices with portion control discussed. Encouraged to start a food diary, count calories and to consider  joining a support group. Sample diet sheets offered. Goals set by the patient for the next several months.    

## 2012-03-02 NOTE — Assessment & Plan Note (Signed)
Hyperlipidemia:Low fat diet discussed and encouraged.  Controlled, no change in medication   

## 2012-04-10 ENCOUNTER — Telehealth: Payer: Self-pay | Admitting: Family Medicine

## 2012-04-10 ENCOUNTER — Other Ambulatory Visit: Payer: Self-pay | Admitting: Family Medicine

## 2012-04-10 MED ORDER — TRIAMCINOLONE ACETONIDE 0.025 % EX CREA: TOPICAL_CREAM | Freq: Two times a day (BID) | CUTANEOUS | Status: DC

## 2012-04-10 NOTE — Telephone Encounter (Signed)
Not on medlist. Can pt get cream sent?

## 2012-04-10 NOTE — Telephone Encounter (Signed)
Medication has been sent to pharmacy, msg left on her phone

## 2012-05-20 ENCOUNTER — Other Ambulatory Visit: Payer: Self-pay | Admitting: Family Medicine

## 2012-05-27 ENCOUNTER — Other Ambulatory Visit: Payer: Self-pay | Admitting: Family Medicine

## 2012-06-03 ENCOUNTER — Ambulatory Visit (HOSPITAL_COMMUNITY)
Admission: RE | Admit: 2012-06-03 | Discharge: 2012-06-03 | Disposition: A | Discharge status: Home / Self Care | Payer: 59 | Source: Ambulatory Visit | Attending: Family Medicine | Admitting: Family Medicine

## 2012-06-03 ENCOUNTER — Ambulatory Visit (INDEPENDENT_AMBULATORY_CARE_PROVIDER_SITE_OTHER): Payer: 59 | Admitting: Family Medicine

## 2012-06-03 ENCOUNTER — Other Ambulatory Visit: Payer: Self-pay | Admitting: Family Medicine

## 2012-06-03 ENCOUNTER — Encounter: Payer: Self-pay | Admitting: Family Medicine

## 2012-06-03 VITALS — BP 142/70 | HR 73 | Resp 18 | Ht 64.5 in | Wt 290.0 lb

## 2012-06-03 DIAGNOSIS — M79641 Pain in right hand: Secondary | ICD-10-CM

## 2012-06-03 DIAGNOSIS — M25559 Pain in unspecified hip: Secondary | ICD-10-CM | POA: Insufficient documentation

## 2012-06-03 DIAGNOSIS — E785 Hyperlipidemia, unspecified: Secondary | ICD-10-CM

## 2012-06-03 DIAGNOSIS — I10 Essential (primary) hypertension: Secondary | ICD-10-CM

## 2012-06-03 DIAGNOSIS — E559 Vitamin D deficiency, unspecified: Secondary | ICD-10-CM

## 2012-06-03 DIAGNOSIS — M79642 Pain in left hand: Secondary | ICD-10-CM

## 2012-06-03 DIAGNOSIS — M25552 Pain in left hip: Secondary | ICD-10-CM

## 2012-06-03 DIAGNOSIS — E669 Obesity, unspecified: Secondary | ICD-10-CM

## 2012-06-03 DIAGNOSIS — M79609 Pain in unspecified limb: Secondary | ICD-10-CM

## 2012-06-03 DIAGNOSIS — E109 Type 1 diabetes mellitus without complications: Secondary | ICD-10-CM

## 2012-06-03 DIAGNOSIS — K219 Gastro-esophageal reflux disease without esophagitis: Secondary | ICD-10-CM

## 2012-06-03 MED ORDER — KETOROLAC TROMETHAMINE 60 MG/2ML IJ SOLN
60.0000 mg | Freq: Once | INTRAMUSCULAR | Status: AC
  Administered 2012-06-03: 60 mg via INTRAMUSCULAR

## 2012-06-03 MED ORDER — ESOMEPRAZOLE MAGNESIUM 20 MG PO CPDR: 20.0000 mg | DELAYED_RELEASE_CAPSULE | Freq: Every day | ORAL | Status: DC

## 2012-06-03 MED ORDER — METHYLPREDNISOLONE ACETATE 80 MG/ML IJ SUSP
80.0000 mg | Freq: Once | INTRAMUSCULAR | Status: AC
  Administered 2012-06-03: 80 mg via INTRAMUSCULAR

## 2012-06-03 NOTE — Progress Notes (Signed)
  Subjective:    [name], female    DOB: 14-Dec-1960, 51 y.o.   MRN: [id number]  HPI Takes 6 units twice daily and 12 units at night, fasting sugars around 150, takes 55 not 60 units lantus 10 day h/o left hip pain, worse today after 10 hour drive could barely walk needs note for work The PT is here for follow up, but primarily because of acute disabling left hip pain , as above and re-evaluation of chronic medical conditions, medication management and review of any available recent lab and radiology data.  Preventive health is updated, specifically  Cancer screening and Immunization.   The PT denies any adverse reactions to current medications since the last visit.  States she is having worsening symptoms of carpal tunnel syndrome in both hands, and wants to see ortho      Review of Systems See HPI Denies recent fever or chills. Denies sinus pressure, nasal congestion, ear pain or sore throat. Denies chest congestion, productive cough or wheezing. Denies chest pains, palpitations and leg swelling Denies abdominal pain, nausea, vomiting,diarrhea or constipation.   Denies dysuria, frequency, hesitancy or incontinence.  Denies headaches, seizures, numbness, or tingling. Denies depression, anxiety or insomnia. Denies skin break down or rash.        Objective:   Physical Exam  Patient alert and oriented and in no cardiopulmonary distress.Pt in pain and limping  HEENT: No facial asymmetry, EOMI, no sinus tenderness,  oropharynx pink and moist.  Neck supple no adenopathy.  Chest: Clear to auscultation bilaterally.  CVS: S1, S2 no murmurs, no S3.  ABD: Soft non tender. Bowel sounds normal.  Ext: No edema  MS: Adequate ROM spine, shoulders, and knees.Decreased markedly in left hip Reduced grip in both hands, tinnel's positive  Skin: Intact, no ulcerations or rash noted.  Psych: Good eye contact, normal affect. Memory intact not anxious or depressed  appearing.  CNS: CN 2-12 intact, power, tone and sensation normal throughout.       Assessment & Plan:

## 2012-06-03 NOTE — Patient Instructions (Addendum)
F/u in 3 month  Increase lantus to 60 units starting tonight, we will call about further med adjustment based on today's lab  HBA1C today and chem 7, tsh  And vit D  You will get toradol 60mg  IM and depo medrol 40mg  Im in the office for hip pain.  Take ibuprofen 800mg  one 3 times daily starting tomorrow for 5 days.  Nexium is sent to your pharmacy.  Xray of left hip  As soon as possible.  Work excuse for today  You are referred to dr Romeo Apple for eval for carpal tunnel syndrome  Fasting lipid, cmp and EGFR , HBA1C in 3 month

## 2012-06-04 LAB — TSH: TSH: 3.197 u[IU]/mL (ref 0.350–4.500)

## 2012-06-04 LAB — BASIC METABOLIC PANEL
BUN: 14 mg/dL (ref 6–23)
CO2: 29 mEq/L (ref 19–32)
Calcium: 9 mg/dL (ref 8.4–10.5)
Chloride: 105 mEq/L (ref 96–112)
Creat: 0.86 mg/dL (ref 0.50–1.10)
Glucose, Bld: 72 mg/dL (ref 70–99)
Potassium: 3.9 mEq/L (ref 3.5–5.3)
Sodium: 140 mEq/L (ref 135–145)

## 2012-06-04 LAB — HEMOGLOBIN A1C
Hgb A1c MFr Bld: 8.1 % — ABNORMAL HIGH (ref <5.7)
Mean Plasma Glucose: 186 mg/dL — ABNORMAL HIGH (ref <117)

## 2012-06-04 LAB — VITAMIN D 25 HYDROXY (VIT D DEFICIENCY, FRACTURES): Vit D, 25-Hydroxy: 23 ng/mL — ABNORMAL LOW (ref 30–89)

## 2012-06-05 DIAGNOSIS — E559 Vitamin D deficiency, unspecified: Secondary | ICD-10-CM | POA: Insufficient documentation

## 2012-06-05 MED ORDER — ERGOCALCIFEROL 1.25 MG (50000 UT) PO CAPS: 50000.0000 [IU] | ORAL_CAPSULE | ORAL | Status: DC

## 2012-06-18 NOTE — Assessment & Plan Note (Signed)
Progressive refer to ortho

## 2012-06-18 NOTE — Assessment & Plan Note (Signed)
Deteriorated. Patient re-educated about  the importance of commitment to a  minimum of 150 minutes of exercise per week. The importance of healthy food choices with portion control discussed. Encouraged to start a food diary, count calories and to consider  joining a support group. Sample diet sheets offered. Goals set by the patient for the next several months.    

## 2012-06-18 NOTE — Assessment & Plan Note (Signed)
Hyperlipidemia:Low fat diet discussed and encouraged.  Controlled, no change in medication   

## 2012-06-18 NOTE — Assessment & Plan Note (Signed)
Deteriorated, medication increased also advised pt to be more diligent with diet and physical activity

## 2012-06-18 NOTE — Assessment & Plan Note (Addendum)
UnControlled, no change in medication DASH diet and commitment to daily physical activity for a minimum of 30 minutes discussed and encouraged, as a part of hypertension management. The importance of attaining a healthy weight is also discussed.  

## 2012-06-18 NOTE — Assessment & Plan Note (Signed)
Acute flare , anti inflammatories in office and prescribed , also work excuse

## 2012-07-10 ENCOUNTER — Encounter: Payer: Self-pay | Admitting: Orthopedic Surgery

## 2012-07-10 ENCOUNTER — Ambulatory Visit (INDEPENDENT_AMBULATORY_CARE_PROVIDER_SITE_OTHER): Payer: 59 | Admitting: Orthopedic Surgery

## 2012-07-10 VITALS — Temp 98.0°F | Ht 64.5 in | Wt 290.0 lb

## 2012-07-10 DIAGNOSIS — G5603 Carpal tunnel syndrome, bilateral upper limbs: Secondary | ICD-10-CM | POA: Insufficient documentation

## 2012-07-10 DIAGNOSIS — G5600 Carpal tunnel syndrome, unspecified upper limb: Secondary | ICD-10-CM

## 2012-07-10 NOTE — Progress Notes (Signed)
[name], female   DOB: [birth year], 51 y.o.   MRN: [id number] Chief Complaint  Patient presents with  . Hand Pain    Bilateral hand pain.Referral from Dr. Lodema Hong.    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 She is having night pain with throbbing 5/10 discomfort. She has not really had any treatment or symptoms came on gradually. She is having some neck pain does not associated with her hand symptoms. She perceives her pain more is distal to proximal and proximal to distal  Risk factors of diabetes  Review of systems positive for heart palpitations seasonal allergies otherwise her review of systems was reviewed and was negative  Past Medical History  Diagnosis Date  . Insulin dependent diabetes mellitus 1996  . Hypertension 2008    Normal CBC and CMet in 2012; negative stress nuclear in 2006- patient asymptomatic  . Obesity   . Hyperlipidemia 2008    Lipid profile in 04/2011:136, 53, 43  . Multiple allergies     perennial  . Fibroids     Uterine   Past Surgical History  Procedure Date  . Refractive surgery 2011    Bilateral, two seperate occasions first in 2006  . Retinal detachment surgery 05/2005    Physical Exam  Nursing note and vitals reviewed. Constitutional: She is oriented to person, place, and time. She appears well-developed and well-nourished.  HENT:  Head: Normocephalic.  Eyes: Pupils are equal, round, and reactive to light.  Neck: Normal range of motion. No JVD present. No tracheal deviation present. No thyromegaly present.  Cardiovascular: Normal rate and intact distal pulses.   Pulmonary/Chest: Effort normal. No stridor. No respiratory distress.  Abdominal: She exhibits no distension.  Lymphadenopathy:    She has no cervical adenopathy.       Right cervical: No superficial cervical adenopathy  present.      Left cervical: No superficial cervical adenopathy present.       Right: No epitrochlear adenopathy present.       Left: No epitrochlear adenopathy present.  Neurological: She is alert and oriented to person, place, and time. She has normal reflexes. She exhibits normal muscle tone. Coordination normal.  Skin: Skin is warm and dry. No rash noted. No erythema. No pallor.  Psychiatric: She has a normal mood and affect. Her behavior is normal. Judgment and thought content normal.  Right Hand Exam   Tenderness  Right hand tenderness location: Volar aspect over the carpal tunnel flexor retinaculum.  Range of Motion  The patient has normal right wrist ROM.   Muscle Strength  The patient has normal right wrist strength. Grip: 5/5   Tests  Tinel's Sign (Medial Nerve): negative  Other  Erythema: absent Scars: absent Sensation: decreased (Primarily median nerve)   Left Hand Exam   Tenderness  Left hand tenderness location: Similar to the right hand with more tenderness.   Range of Motion  The patient has normal left wrist ROM.  Muscle Strength  The patient has normal left wrist strength. Grip:  5/5   Tests  Tinel's Sign (Medial Nerve): negative  Other  Erythema: absent Scars: absent Sensation: decreased (Primarily median nerve)     Bilateral carpal tunnel syndrome really not treated just yet. Recommend bilateral night splints. Nerve conduction study to document status of median nerve. Neuron 100 mg at night and vitamin D 600 mg twice daily next  6 weeks if no improvement then discuss carpal tunnel release

## 2012-07-10 NOTE — Patient Instructions (Addendum)
Wear splints x 6 weeks  Take neurontin x 6 weeks  And vit B 6 X 6 WEEKS   ORDER NCS   Carpal Tunnel Syndrome You may have carpal tunnel syndrome. This is a common condition. Carpal tunnel syndrome occurs when the tendons, bones, or ligaments in the wrist press against the median nerve as it passes into the hand.   Symptoms can include:  Intermittent numbness.     Pain or a tingling sensation in thumb and first two fingers.  The pain may radiate up to the shoulder. There may even be weakness in the hand muscles. The pain is often worse at night and in the early morning. Nerve conduction tests may be used to prove the diagnosis. Carpal tunnel syndrome is most often due to repeated movements of the hand or wrist. Other causes can include:  Prior injuries.     Diabetes.    Obesity.    Smoking.    Pregnancy. Symptoms that develop during pregnancy often stop when the pregnancy is over.  Treatment includes:  Splinting - A wrist splint helps prevent movements that irritate the nerve. Splints are especially helpful at night when the symptoms are often worse.     Ice packs - Cold packs applied to the palm side of the wrist for 20 minutes every 2 hours while awake may give some relief.     Medication - Medicine to reduce inflammation and pain are often used. Cortisone injections around the nerve may also bring improvement.  Severe cases of carpal tunnel syndrome can require surgery to relieve the pressure on the nerve. This may be necessary if there is evidence of weakness or decreased sensation in your hand, or if your symptoms do not improve with conservative treatment. See your caregiver for follow-up to be certain your condition is improving. Document Released: 12/07/2004 Document Revised: 07/12/2011 Document Reviewed: 09/05/2007 [facility] Patient Information 2012 [place], [place].

## 2012-07-12 ENCOUNTER — Other Ambulatory Visit: Payer: Self-pay | Admitting: *Deleted

## 2012-07-12 DIAGNOSIS — G5603 Carpal tunnel syndrome, bilateral upper limbs: Secondary | ICD-10-CM

## 2012-07-16 ENCOUNTER — Other Ambulatory Visit: Payer: Self-pay

## 2012-07-16 MED ORDER — ROSUVASTATIN CALCIUM 20 MG PO TABS: ORAL_TABLET | ORAL | Status: DC

## 2012-07-17 ENCOUNTER — Encounter: Payer: Self-pay | Admitting: Family Medicine

## 2012-07-17 ENCOUNTER — Ambulatory Visit (INDEPENDENT_AMBULATORY_CARE_PROVIDER_SITE_OTHER): Payer: 59 | Admitting: Family Medicine

## 2012-07-17 VITALS — BP 120/60 | HR 83 | Resp 18 | Wt 292.0 lb

## 2012-07-17 DIAGNOSIS — E785 Hyperlipidemia, unspecified: Secondary | ICD-10-CM

## 2012-07-17 DIAGNOSIS — I10 Essential (primary) hypertension: Secondary | ICD-10-CM

## 2012-07-17 DIAGNOSIS — E669 Obesity, unspecified: Secondary | ICD-10-CM

## 2012-07-17 DIAGNOSIS — E109 Type 1 diabetes mellitus without complications: Secondary | ICD-10-CM

## 2012-07-17 NOTE — Patient Instructions (Addendum)
F/u  in 6 weeks. Bring log, test  Two to three times daily and record.  BLood sugar  Is uncontrolled, eat regularly on a schedule and increase the lantus to 65 units every day, take at breakfast.  Increase novolog from 6 to 8 units before breakfast and lunch, no change in supper dose at this  time.  Goal for fasting blood sugar ranges from 80 to 120 and 2 hours after any meal or at bedtime should be between 130 to 170.

## 2012-07-21 NOTE — Assessment & Plan Note (Signed)
Controlled, no change in medication DASH diet and commitment to daily physical activity for a minimum of 30 minutes discussed and encouraged, as a part of hypertension management. The importance of attaining a healthy weight is also discussed.  

## 2012-07-21 NOTE — Assessment & Plan Note (Signed)
Uncontrolled, dose increase on insulin, pt to commit to more discipline with diet and physical activity

## 2012-07-21 NOTE — Assessment & Plan Note (Signed)
Deteriorated. Patient re-educated about  the importance of commitment to a  minimum of 150 minutes of exercise per week. The importance of healthy food choices with portion control discussed. Encouraged to start a food diary, count calories and to consider  joining a support group. Sample diet sheets offered. Goals set by the patient for the next several months.    

## 2012-07-21 NOTE — Assessment & Plan Note (Signed)
Controlled, no change in medication Hyperlipidemia:Low fat diet discussed and encouraged.  Updated labs at next visit 

## 2012-07-21 NOTE — Progress Notes (Signed)
  Subjective:    [name], female    DOB: [birth year], 51 y.o.   MRN: [id number]  HPI The PT is here for follow up and re-evaluation of chronic medical conditions, medication management and review of any available recent lab and radiology data.  Preventive health is updated, specifically  Cancer screening and Immunization.   Questions or concerns regarding consultations or procedures which the PT has had in the interim are  addressed. The PT denies any adverse reactions to current medications since the last visit.  Reports that blood sugars are still higher than they should be and that she is inconsistent with both diet and physical activity    Review of Systems See HPI Denies recent fever or chills. Denies sinus pressure, nasal congestion, ear pain or sore throat. Denies chest congestion, productive cough or wheezing. Denies chest pains, palpitations and leg swelling Denies abdominal pain, nausea, vomiting,diarrhea or constipation.   Denies dysuria, frequency, hesitancy or incontinence. Denies joint pain, swelling and limitation in mobility. Denies headaches, seizures, numbness, or tingling. Denies depression, anxiety or insomnia. Denies skin break down or rash.        Objective:   Physical Exam  Patient alert and oriented and in no cardiopulmonary distress.  HEENT: No facial asymmetry, EOMI, no sinus tenderness,  oropharynx pink and moist.  Neck supple no adenopathy.  Chest: Clear to auscultation bilaterally.  CVS: S1, S2 no murmurs, no S3.  ABD: Soft non tender. Bowel sounds normal.  Ext: No edema  MS: Adequate ROM spine, shoulders, hips and knees.  Skin: Intact, no ulcerations or rash noted.  Psych: Good eye contact, normal affect. Memory intact not anxious or depressed appearing.  CNS: CN 2-12 intact, power, tone and sensation normal throughout. Diabetic Foot Check:  Appearance - no lesions, ulcers or calluses Skin - no unusual pallor or  redness Sensation - grossly intact to light touch Monofilament testing -  Right - Great toe, medial, central, lateral ball and posterior foot intact Left - Great toe, medial, central, lateral ball and posterior foot intact Pulses Left - Dorsalis Pedis and Posterior Tibia normal Right - Dorsalis Pedis and Posterior Tibia normal       Assessment & Plan:

## 2012-07-22 ENCOUNTER — Ambulatory Visit (INDEPENDENT_AMBULATORY_CARE_PROVIDER_SITE_OTHER): Payer: 59 | Admitting: Ophthalmology

## 2012-07-23 ENCOUNTER — Telehealth: Payer: Self-pay | Admitting: *Deleted

## 2012-07-23 NOTE — Telephone Encounter (Signed)
Will be seen at [facility] for NCS 07/26/12

## 2012-07-24 ENCOUNTER — Other Ambulatory Visit: Payer: Self-pay | Admitting: Family Medicine

## 2012-08-08 ENCOUNTER — Telehealth: Payer: Self-pay | Admitting: Orthopedic Surgery

## 2012-08-08 NOTE — Telephone Encounter (Signed)
Birder Robson asked for you to call her.  She has a question about the NCS done yesterday Her # [phone]

## 2012-08-12 NOTE — Telephone Encounter (Signed)
Called patient, left message.

## 2012-08-23 NOTE — Telephone Encounter (Signed)
Called patient, left message Patient has appt next week

## 2012-08-28 ENCOUNTER — Encounter: Payer: Self-pay | Admitting: Orthopedic Surgery

## 2012-08-28 ENCOUNTER — Ambulatory Visit (INDEPENDENT_AMBULATORY_CARE_PROVIDER_SITE_OTHER): Payer: 59 | Admitting: Orthopedic Surgery

## 2012-08-28 VITALS — BP 90/60 | Ht 64.5 in | Wt 292.0 lb

## 2012-08-28 DIAGNOSIS — G5600 Carpal tunnel syndrome, unspecified upper limb: Secondary | ICD-10-CM

## 2012-08-28 DIAGNOSIS — G5603 Carpal tunnel syndrome, bilateral upper limbs: Secondary | ICD-10-CM

## 2012-08-28 NOTE — Progress Notes (Signed)
[name], female   DOB: 1961/07/27, 51 y.o.   MRN: [id number] Chief Complaint  Patient presents with  . Results    NCS follow up     Bilateral carpal tunnel syndrome really not treated just yet. Recommend bilateral night splints. Nerve conduction study to document status of median nerve. Neurontin  100 mg at night and vitamin B 600 mg twice daily next 6 weeks if no improvement then discuss carpal tunnel release    Decided to forego shots  Mild improvement in intensity and night pain could not tolerate neurontin   F/u as needed continue current management

## 2012-08-28 NOTE — Patient Instructions (Signed)
Continue bracing and vitamin B6

## 2012-09-03 ENCOUNTER — Ambulatory Visit: Payer: 59 | Admitting: Family Medicine

## 2012-09-23 ENCOUNTER — Other Ambulatory Visit: Payer: Self-pay | Admitting: Family Medicine

## 2012-09-23 ENCOUNTER — Telehealth: Payer: Self-pay | Admitting: Family Medicine

## 2012-09-23 DIAGNOSIS — Z139 Encounter for screening, unspecified: Secondary | ICD-10-CM

## 2012-09-23 NOTE — Telephone Encounter (Signed)
Lab order sent to lab. Pt aware

## 2012-09-27 LAB — LIPID PANEL
Cholesterol: 133 mg/dL (ref 0–200)
HDL: 45 mg/dL (ref >39)
LDL Cholesterol: 78 mg/dL (ref 0–99)
Total CHOL/HDL Ratio: 3 Ratio
Triglycerides: 48 mg/dL (ref <150)
VLDL: 10 mg/dL (ref 0–40)

## 2012-09-27 LAB — COMPLETE METABOLIC PANEL WITH GFR
ALT: 16 U/L (ref 0–35)
AST: 16 U/L (ref 0–37)
Albumin: 4.1 g/dL (ref 3.5–5.2)
Alkaline Phosphatase: 86 U/L (ref 39–117)
BUN: 15 mg/dL (ref 6–23)
CO2: 32 mEq/L (ref 19–32)
Calcium: 9.4 mg/dL (ref 8.4–10.5)
Chloride: 104 mEq/L (ref 96–112)
Creat: 0.85 mg/dL (ref 0.50–1.10)
GFR, Est African American: >89 mL/min
GFR, Est Non African American: 79 mL/min
Glucose, Bld: 61 mg/dL — ABNORMAL LOW (ref 70–99)
Potassium: 4.3 mEq/L (ref 3.5–5.3)
Sodium: 143 mEq/L (ref 135–145)
Total Bilirubin: 0.5 mg/dL (ref 0.3–1.2)
Total Protein: 7.1 g/dL (ref 6.0–8.3)

## 2012-09-27 LAB — HEMOGLOBIN A1C
Hgb A1c MFr Bld: 7.3 % — ABNORMAL HIGH (ref <5.7)
Mean Plasma Glucose: 163 mg/dL — ABNORMAL HIGH (ref <117)

## 2012-09-30 ENCOUNTER — Ambulatory Visit (INDEPENDENT_AMBULATORY_CARE_PROVIDER_SITE_OTHER): Payer: 59 | Admitting: Family Medicine

## 2012-09-30 ENCOUNTER — Encounter: Payer: Self-pay | Admitting: Family Medicine

## 2012-09-30 VITALS — BP 120/82 | HR 82 | Resp 15 | Ht 64.5 in | Wt 298.1 lb

## 2012-09-30 DIAGNOSIS — E109 Type 1 diabetes mellitus without complications: Secondary | ICD-10-CM

## 2012-09-30 DIAGNOSIS — I10 Essential (primary) hypertension: Secondary | ICD-10-CM

## 2012-09-30 DIAGNOSIS — E785 Hyperlipidemia, unspecified: Secondary | ICD-10-CM

## 2012-09-30 DIAGNOSIS — G5603 Carpal tunnel syndrome, bilateral upper limbs: Secondary | ICD-10-CM

## 2012-09-30 DIAGNOSIS — G5600 Carpal tunnel syndrome, unspecified upper limb: Secondary | ICD-10-CM

## 2012-09-30 DIAGNOSIS — E669 Obesity, unspecified: Secondary | ICD-10-CM

## 2012-09-30 NOTE — Assessment & Plan Note (Signed)
Has had ortho eval, splints only

## 2012-09-30 NOTE — Patient Instructions (Addendum)
F/u in 3.5 month, please call if you need me before  Pls check and find out when you had your tetanus shot so I can enter this on your record  microalb today.  CBC today.  You NEED to schedule your colonoscopy, this is 2 years overdue  Blood pressure and cholesterol are excellent.  Blood sugar has improved but is still too high.  Please commit to 30 minutes exercise every day.  Please follow a 1500 cal diet and schedule regular appointments with the dietitian to help with weight loss  You need to lose 3 to 4 pounds per month  HBa1c in 4 month  Pls follow labs and radiology reports on my chart

## 2012-09-30 NOTE — Assessment & Plan Note (Signed)
Improved. Patient advised to reduce carb and sweets, commit to regular physical activity, take meds as prescribed, test blood as directed, and attempt to lose weight, to improve blood sugar control. Pt encouraged to stick with weight loss and exercise program so blood sugars will increase

## 2012-09-30 NOTE — Assessment & Plan Note (Signed)
Controlled, no change in medication DASH diet and commitment to daily physical activity for a minimum of 30 minutes discussed and encouraged, as a part of hypertension management. The importance of attaining a healthy weight is also discussed.  

## 2012-09-30 NOTE — Assessment & Plan Note (Signed)
Deteriorated. Patient re-educated about  the importance of commitment to a  minimum of 150 minutes of exercise per week. The importance of healthy food choices with portion control discussed. Encouraged to start a food diary, count calories and to consider  joining a support group. Sample diet sheets offered. Goals set by the patient for the next several months.    

## 2012-09-30 NOTE — Progress Notes (Signed)
  Subjective:    [name], female    DOB: 05-24-1961, 51 y.o.   MRN: [id number]  HPI The PT is here for follow up and re-evaluation of chronic medical conditions, medication management and review of any available recent lab and radiology data.  Preventive health is updated, specifically  Cancer screening and Immunization.   Questions or concerns regarding consultations or procedures which the PT has had in the interim are  Addressed.Was evaluated by ortho for carpal tunnel and splints recommended The PT denies any adverse reactions to current medications since the last visit.  Concerned about ongoing weight gain, no interest in surgery at this time, plans to refocus on lifestyle change       Review of Systems See HPI Denies recent fever or chills. Denies sinus pressure, nasal congestion, ear pain or sore throat. Denies chest congestion, productive cough or wheezing. Denies chest pains, palpitations and leg swelling Denies abdominal pain, nausea, vomiting,diarrhea or constipation.   Denies dysuria, frequency, hesitancy or incontinence. Denies joint pain, swelling and limitation in mobility. Denies headaches, seizures, numbness, or tingling. Denies depression, anxiety or insomnia. Denies skin break down or rash.        Objective:   Physical Exam  Patient alert and oriented and in no cardiopulmonary distress.  HEENT: No facial asymmetry, EOMI, no sinus tenderness,  oropharynx pink and moist.  Neck supple no adenopathy.  Chest: Clear to auscultation bilaterally.  CVS: S1, S2 no murmurs, no S3.  ABD: Soft non tender. Bowel sounds normal.  Ext: No edema  MS: Adequate ROM spine, shoulders, hips and knees.  Skin: Intact, no ulcerations or rash noted.  Psych: Good eye contact, normal affect. Memory intact not anxious or depressed appearing.  CNS: CN 2-12 intact, power, tone and sensation normal throughout.       Assessment & Plan:

## 2012-10-01 LAB — CBC
HCT: 36.3 % (ref 36.0–46.0)
Hemoglobin: 12.2 g/dL (ref 12.0–15.0)
MCH: 29.2 pg (ref 26.0–34.0)
MCHC: 33.6 g/dL (ref 30.0–36.0)
MCV: 86.8 fL (ref 78.0–100.0)
Platelets: 337 10*3/uL (ref 150–400)
RBC: 4.18 MIL/uL (ref 3.87–5.11)
RDW: 14.5 % (ref 11.5–15.5)
WBC: 11.1 10*3/uL — ABNORMAL HIGH (ref 4.0–10.5)

## 2012-10-02 LAB — MICROALBUMIN / CREATININE URINE RATIO
Creatinine, Urine: 250.2 mg/dL
Microalb Creat Ratio: 38.2 mg/g — ABNORMAL HIGH (ref 0.0–30.0)
Microalb, Ur: 9.56 mg/dL — ABNORMAL HIGH (ref 0.00–1.89)

## 2012-10-04 ENCOUNTER — Telehealth: Payer: Self-pay | Admitting: Family Medicine

## 2012-10-04 NOTE — Telephone Encounter (Signed)
Message left for patient

## 2012-10-04 NOTE — Telephone Encounter (Signed)
Patient was recently in for an appointment.  Please advise.

## 2012-10-04 NOTE — Telephone Encounter (Signed)
She can use Sudafed for 3 days for decongestant Nasal saline  Claritin or mucinex for sinus

## 2012-11-04 ENCOUNTER — Ambulatory Visit (HOSPITAL_COMMUNITY)
Admission: RE | Admit: 2012-11-04 | Discharge: 2012-11-04 | Disposition: A | Discharge status: Home / Self Care | Payer: 59 | Source: Ambulatory Visit | Attending: Family Medicine | Admitting: Family Medicine

## 2012-11-04 DIAGNOSIS — Z139 Encounter for screening, unspecified: Secondary | ICD-10-CM

## 2012-11-04 DIAGNOSIS — Z1231 Encounter for screening mammogram for malignant neoplasm of breast: Secondary | ICD-10-CM | POA: Insufficient documentation

## 2012-11-15 ENCOUNTER — Other Ambulatory Visit: Payer: Self-pay | Admitting: Family Medicine

## 2012-11-30 ENCOUNTER — Telehealth: Payer: Self-pay | Admitting: Family Medicine

## 2012-11-30 MED ORDER — GUAIFENESIN-CODEINE 200-10 MG/5ML PO LIQD: ORAL | Status: DC

## 2012-11-30 NOTE — Telephone Encounter (Signed)
Pt called URI symptoms, cough, hoarse voice x 2 days, asked for meds to be called in, she is diabetic. No lung disease. Advised fluids, rest, cough drops, no diabetic tussin at [facility]- called in tussin with codiene 2 week supply.  If still sick Monday or worsening call for appt

## 2013-01-10 ENCOUNTER — Other Ambulatory Visit: Payer: Self-pay | Admitting: Cardiology

## 2013-01-10 ENCOUNTER — Other Ambulatory Visit: Payer: Self-pay

## 2013-01-10 MED ORDER — METOPROLOL SUCCINATE ER 100 MG PO TB24: ORAL_TABLET | ORAL | Status: DC

## 2013-01-15 ENCOUNTER — Other Ambulatory Visit: Payer: Self-pay

## 2013-01-15 MED ORDER — FUROSEMIDE 20 MG PO TABS: ORAL_TABLET | ORAL | Status: DC

## 2013-01-15 MED ORDER — ROSUVASTATIN CALCIUM 20 MG PO TABS: ORAL_TABLET | ORAL | Status: DC

## 2013-01-16 ENCOUNTER — Other Ambulatory Visit: Payer: Self-pay | Admitting: Family Medicine

## 2013-01-23 ENCOUNTER — Telehealth: Payer: Self-pay | Admitting: Family Medicine

## 2013-01-23 DIAGNOSIS — I10 Essential (primary) hypertension: Secondary | ICD-10-CM

## 2013-01-23 DIAGNOSIS — E785 Hyperlipidemia, unspecified: Secondary | ICD-10-CM

## 2013-01-23 DIAGNOSIS — R5381 Other malaise: Secondary | ICD-10-CM

## 2013-01-23 DIAGNOSIS — E109 Type 1 diabetes mellitus without complications: Secondary | ICD-10-CM

## 2013-01-23 DIAGNOSIS — E559 Vitamin D deficiency, unspecified: Secondary | ICD-10-CM

## 2013-01-23 NOTE — Telephone Encounter (Signed)
Faxed over

## 2013-01-24 LAB — BASIC METABOLIC PANEL
BUN: 12 mg/dL (ref 6–23)
CO2: 28 mEq/L (ref 19–32)
Calcium: 9.5 mg/dL (ref 8.4–10.5)
Chloride: 103 mEq/L (ref 96–112)
Creat: 0.77 mg/dL (ref 0.50–1.10)
Glucose, Bld: 124 mg/dL — ABNORMAL HIGH (ref 70–99)
Potassium: 4.4 mEq/L (ref 3.5–5.3)
Sodium: 138 mEq/L (ref 135–145)

## 2013-01-24 LAB — TSH: TSH: 2.826 u[IU]/mL (ref 0.350–4.500)

## 2013-01-24 LAB — HEMOGLOBIN A1C
Hgb A1c MFr Bld: 8.5 % — ABNORMAL HIGH (ref <5.7)
Mean Plasma Glucose: 197 mg/dL — ABNORMAL HIGH (ref <117)

## 2013-01-25 LAB — VITAMIN D 25 HYDROXY (VIT D DEFICIENCY, FRACTURES): Vit D, 25-Hydroxy: 43 ng/mL (ref 30–89)

## 2013-01-29 ENCOUNTER — Ambulatory Visit (INDEPENDENT_AMBULATORY_CARE_PROVIDER_SITE_OTHER): Payer: 59 | Admitting: Family Medicine

## 2013-01-29 ENCOUNTER — Encounter: Payer: Self-pay | Admitting: Family Medicine

## 2013-01-29 VITALS — BP 130/82 | HR 92 | Resp 18 | Ht 64.5 in | Wt 295.0 lb

## 2013-01-29 DIAGNOSIS — E109 Type 1 diabetes mellitus without complications: Secondary | ICD-10-CM

## 2013-01-29 DIAGNOSIS — E785 Hyperlipidemia, unspecified: Secondary | ICD-10-CM

## 2013-01-29 DIAGNOSIS — I10 Essential (primary) hypertension: Secondary | ICD-10-CM

## 2013-01-29 DIAGNOSIS — E669 Obesity, unspecified: Secondary | ICD-10-CM

## 2013-01-29 NOTE — Patient Instructions (Addendum)
F/u end may, bring log book please.  Test and record blood sugars 3 times daily  Goal for fasting blood sugar ranges from 80 to 120 and 2 hours after any meal or at bedtime should be between 130 to 170.   Increase the lantus to 65 units every morning.call with questions  Commit to daily exercise.  Blood pressure is good.  Sorry about your loss  Please change to 60 ounces of water daily, no drinks sweetened or juices  Pls sched your eye exam

## 2013-01-31 ENCOUNTER — Other Ambulatory Visit: Payer: Self-pay

## 2013-01-31 DIAGNOSIS — K219 Gastro-esophageal reflux disease without esophagitis: Secondary | ICD-10-CM

## 2013-01-31 DIAGNOSIS — E559 Vitamin D deficiency, unspecified: Secondary | ICD-10-CM

## 2013-01-31 MED ORDER — LOSARTAN POTASSIUM 100 MG PO TABS: ORAL_TABLET | ORAL | Status: DC

## 2013-01-31 MED ORDER — ERGOCALCIFEROL 1.25 MG (50000 UT) PO CAPS: 50000.0000 [IU] | ORAL_CAPSULE | ORAL | Status: DC

## 2013-01-31 MED ORDER — INSULIN ASPART 100 UNIT/ML ~~LOC~~ SOLN: 6.0000 [IU] | Freq: Three times a day (TID) | SUBCUTANEOUS | Status: DC

## 2013-01-31 MED ORDER — ESOMEPRAZOLE MAGNESIUM 20 MG PO CPDR: 20.0000 mg | DELAYED_RELEASE_CAPSULE | Freq: Every day | ORAL | Status: DC

## 2013-01-31 MED ORDER — DILTIAZEM HCL ER BEADS 360 MG PO CP24: ORAL_CAPSULE | ORAL | Status: DC

## 2013-01-31 MED ORDER — EXENATIDE 10 MCG/0.04ML ~~LOC~~ SOPN: 10.0000 ug | PEN_INJECTOR | Freq: Two times a day (BID) | SUBCUTANEOUS | Status: DC

## 2013-01-31 MED ORDER — METFORMIN HCL 1000 MG PO TABS: 1000.0000 mg | ORAL_TABLET | Freq: Two times a day (BID) | ORAL | Status: DC

## 2013-02-02 NOTE — Assessment & Plan Note (Signed)
Controlled, no change in medication Hyperlipidemia:Low fat diet discussed and encouraged.  \ 

## 2013-02-02 NOTE — Assessment & Plan Note (Addendum)
Deteriorated, non compliant with diet and lifestyle Increase lantus dose, pt reports a lot of stress recently, intends to take control of her illness Close f/u with 3 times daily testing and recording, pt also to call wit concerns

## 2013-02-02 NOTE — Assessment & Plan Note (Signed)
Controlled, no change in medication DASH diet and commitment to daily physical activity for a minimum of 30 minutes discussed and encouraged, as a part of hypertension management. The importance of attaining a healthy weight is also discussed.  

## 2013-02-02 NOTE — Progress Notes (Signed)
  Subjective:    [name], female    DOB: 12-18-1960, 52 y.o.   MRN: [id number]  HPI The PT is here for follow up and re-evaluation of chronic medical conditions, medication management and review of any available recent lab and radiology data.  Preventive health is updated, specifically  Cancer screening and Immunization.   Questions or concerns regarding consultations or procedures which the PT has had in the interim are  addressed. The PT denies any adverse reactions to current medications since the last visit.  States she has been under a lot of stress in the past several months, with multiple deaths in her family, as a result, she uses this as the reason she has not been diligent in blood sugar testing and following her diet, her blood sugars have increased markedly and she is uncontrolled. Vows to change this and will have close follow up     Review of Systems See HPI` Denies recent fever or chills. Denies sinus pressure, nasal congestion, ear pain or sore throat. Denies chest congestion, productive cough or wheezing. Denies chest pains, palpitations and leg swelling Denies abdominal pain, nausea, vomiting,diarrhea or constipation.   Denies dysuria, frequency, hesitancy or incontinence. Denies joint pain, swelling and limitation in mobility. Denies headaches, seizures, numbness, or tingling. Denies depression, anxiety or insomnia. Denies skin break down or rash.        Objective:   Physical Exam Patient alert and oriented and in no cardiopulmonary distress.  HEENT: No facial asymmetry, EOMI, no sinus tenderness,  oropharynx pink and moist.  Neck supple no adenopathy.  Chest: Clear to auscultation bilaterally.  CVS: S1, S2 no murmurs, no S3.  ABD: Soft non tender. Bowel sounds normal.  Ext: No edema  MS: Adequate ROM spine, shoulders, hips and knees.  Skin: Intact, no ulcerations or rash noted.  Psych: Good eye contact, normal affect. Memory  intact not anxious or depressed appearing.  CNS: CN 2-12 intact, power, tone and sensation normal throughout.        Assessment & Plan:

## 2013-02-02 NOTE — Assessment & Plan Note (Signed)
Deteriorated. Patient re-educated about  the importance of commitment to a  minimum of 150 minutes of exercise per week. The importance of healthy food choices with portion control discussed. Encouraged to start a food diary, count calories and to consider  joining a support group. Sample diet sheets offered. Goals set by the patient for the next several months.    

## 2013-04-09 ENCOUNTER — Ambulatory Visit (INDEPENDENT_AMBULATORY_CARE_PROVIDER_SITE_OTHER): Payer: 59 | Admitting: Family Medicine

## 2013-04-09 ENCOUNTER — Encounter: Payer: Self-pay | Admitting: Family Medicine

## 2013-04-09 VITALS — BP 122/68 | HR 94 | Resp 18 | Ht 64.5 in | Wt 297.1 lb

## 2013-04-09 DIAGNOSIS — E669 Obesity, unspecified: Secondary | ICD-10-CM

## 2013-04-09 DIAGNOSIS — E785 Hyperlipidemia, unspecified: Secondary | ICD-10-CM

## 2013-04-09 DIAGNOSIS — E559 Vitamin D deficiency, unspecified: Secondary | ICD-10-CM

## 2013-04-09 DIAGNOSIS — R5381 Other malaise: Secondary | ICD-10-CM

## 2013-04-09 DIAGNOSIS — R5383 Other fatigue: Secondary | ICD-10-CM

## 2013-04-09 DIAGNOSIS — I10 Essential (primary) hypertension: Secondary | ICD-10-CM

## 2013-04-09 DIAGNOSIS — E109 Type 1 diabetes mellitus without complications: Secondary | ICD-10-CM

## 2013-04-09 DIAGNOSIS — K219 Gastro-esophageal reflux disease without esophagitis: Secondary | ICD-10-CM

## 2013-04-09 MED ORDER — ERGOCALCIFEROL 1.25 MG (50000 UT) PO CAPS: 50000.0000 [IU] | ORAL_CAPSULE | ORAL | Status: DC

## 2013-04-09 MED ORDER — CANAGLIFLOZIN 100 MG PO TABS: 1.0000 | ORAL_TABLET | Freq: Every day | ORAL | Status: AC

## 2013-04-09 NOTE — Progress Notes (Signed)
  Subjective:    [name], female    DOB: January 21, 1961, 52 y.o.   MRN: [id number]  HPI The PT is here for follow up and re-evaluation of chronic medical conditions, medication management and review of any available recent lab and radiology data.  Preventive health is updated, specifically  Cancer screening and Immunization.   Questions or concerns regarding consultations or procedures which the PT has had in the interim are  Addressed.Started on metoprolol for palpitations by cardiology, now c/o fatigue. Blood sugars remain uncontrolled, spouse reports that she is constantly snacking on twinkies, candy etc     Review of Systems See HPI Denies recent fever or chills. Denies sinus pressure, nasal congestion, ear pain or sore throat. Denies chest congestion, productive cough or wheezing. Denies chest pains, palpitations and leg swelling Denies abdominal pain, nausea, vomiting,diarrhea or constipation.   Denies dysuria, frequency, hesitancy or incontinence. Denies joint pain, swelling and limitation in mobility. Denies headaches, seizures, numbness, or tingling. Denies depression, anxiety or insomnia. Denies skin break down or rash.        Objective:   Physical Exam Patient alert and oriented and in no cardiopulmonary distress.  HEENT: No facial asymmetry, EOMI, no sinus tenderness,  oropharynx pink and moist.  Neck supple no adenopathy.  Chest: Clear to auscultation bilaterally.  CVS: S1, S2 no murmurs, no S3.  ABD: Soft non tender. Bowel sounds normal.  Ext: No edema  MS: Adequate ROM spine, shoulders, hips and knees.  Skin: Intact, no ulcerations or rash noted.  Psych: Good eye contact, normal affect. Memory intact not anxious or depressed appearing.  CNS: CN 2-12 intact, power, tone and sensation normal throughout.        Assessment & Plan:

## 2013-04-09 NOTE — Patient Instructions (Addendum)
F/u  June 16 or after  HBa1C June 14 or after, cmp and EGFR, fasting lipid and CBc   Call Dr Dietrich Pates re s/e of metoprolol for directioons.  New for blood sugar is invokana 1 daily, you will get script Aim for fasting sugars consistently less than 130, and over 80  It is important that you exercise regularly at least 30 minutes 5 times a week. If you develop chest pain, have severe difficulty breathing, or feel very tired, stop exercising immediately and seek medical attention

## 2013-04-15 ENCOUNTER — Telehealth: Payer: Self-pay | Admitting: *Deleted

## 2013-04-15 NOTE — Telephone Encounter (Signed)
PT STATES THAT SHE HAS BEEN TIRED FOR A LONG TIME AND SEEN DR SIMPSON FOR THIS. DR SIMPSON SUGGESTED SHE CALL us TO SEE IF WE COULD CUT BACK ON HER TOPROL.  PT LAST SEEN DR Dietrich Pates 10/2011 AND WAS PRN AT THAT TIME.

## 2013-04-15 NOTE — Telephone Encounter (Signed)
Agree with plans for reevaluation in office.

## 2013-04-15 NOTE — Telephone Encounter (Signed)
Called pt to confirm current signs and symptoms, pt noted the fatigue for a couple months in the morning times, does note this sometimes get better throughout the day, pt denies taking any new medication, pt noted BP readings ranging 98/68 through 120/70, pt was advised to take toprol for heart palpitations, pt noted palpations once or twice weekly while lying down at night, denies chest pain/SOB offered pt to be seen by RR on 04-17-13, pt declined per unable to get off from work, offered apt with [facility], pt declined and wants to see RR only, pt accepted apt for 04-24-13, please advise if any adjustments need to be made prior to OV

## 2013-04-24 ENCOUNTER — Ambulatory Visit: Payer: 59 | Admitting: Cardiology

## 2013-04-24 NOTE — Assessment & Plan Note (Signed)
Hyperlipidemia:Low fat diet discussed and encouraged.  Updated lab past due , needed for next visit

## 2013-04-24 NOTE — Assessment & Plan Note (Signed)
Deteriorated. Patient re-educated about  the importance of commitment to a  minimum of 150 minutes of exercise per week. The importance of healthy food choices with portion control discussed. Encouraged to start a food diary, count calories and to consider  joining a support group. Sample diet sheets offered. Goals set by the patient for the next several months.    

## 2013-04-24 NOTE — Assessment & Plan Note (Signed)
Uncontrolled, dietary indiscretion is a major factor Patient advised to reduce carb and sweets, commit to regular physical activity, take meds as prescribed, test blood as directed, and attempt to lose weight, to improve blood sugar control. Updated lab next visit, add invokana 100mg  daily

## 2013-04-24 NOTE — Assessment & Plan Note (Signed)
Controlled, no change in medication  

## 2013-04-24 NOTE — Assessment & Plan Note (Signed)
Controlled, no change in medication DASH diet and commitment to daily physical activity for a minimum of 30 minutes discussed and encouraged, as a part of hypertension management. The importance of attaining a healthy weight is also discussed. C/o fatigue on metoprolol, recently started by card due to c/o palpitation, pt to contact card for further directions, I advised her to give med more time for her to become adjusted

## 2013-05-08 ENCOUNTER — Other Ambulatory Visit: Payer: Self-pay | Admitting: Family Medicine

## 2013-05-08 ENCOUNTER — Other Ambulatory Visit: Payer: Self-pay

## 2013-05-08 MED ORDER — GLUCOSE BLOOD VI STRP: ORAL_STRIP | Status: DC

## 2013-05-08 MED ORDER — INSULIN ASPART 100 UNIT/ML ~~LOC~~ SOLN: 6.0000 [IU] | Freq: Three times a day (TID) | SUBCUTANEOUS | Status: DC

## 2013-05-09 ENCOUNTER — Other Ambulatory Visit: Payer: Self-pay

## 2013-05-09 DIAGNOSIS — E109 Type 1 diabetes mellitus without complications: Secondary | ICD-10-CM

## 2013-05-09 MED ORDER — EXENATIDE 10 MCG/0.04ML ~~LOC~~ SOPN: 10.0000 ug | PEN_INJECTOR | Freq: Two times a day (BID) | SUBCUTANEOUS | Status: DC

## 2013-05-09 MED ORDER — INSULIN GLARGINE 100 UNIT/ML ~~LOC~~ SOLN: 60.0000 [IU] | Freq: Every day | SUBCUTANEOUS | Status: DC

## 2013-05-13 ENCOUNTER — Ambulatory Visit: Payer: 59 | Admitting: Family Medicine

## 2013-05-22 ENCOUNTER — Other Ambulatory Visit: Payer: Self-pay

## 2013-06-09 LAB — COMPLETE METABOLIC PANEL WITH GFR
ALT: 12 U/L (ref 0–35)
AST: 12 U/L (ref 0–37)
Albumin: 4 g/dL (ref 3.5–5.2)
Alkaline Phosphatase: 84 U/L (ref 39–117)
BUN: 10 mg/dL (ref 6–23)
CO2: 30 mEq/L (ref 19–32)
Calcium: 9.2 mg/dL (ref 8.4–10.5)
Chloride: 103 mEq/L (ref 96–112)
Creat: 0.82 mg/dL (ref 0.50–1.10)
GFR, Est African American: >89 mL/min
GFR, Est Non African American: 82 mL/min
Glucose, Bld: 36 mg/dL — CL (ref 70–99)
Potassium: 4.1 mEq/L (ref 3.5–5.3)
Sodium: 140 mEq/L (ref 135–145)
Total Bilirubin: 0.4 mg/dL (ref 0.3–1.2)
Total Protein: 6.8 g/dL (ref 6.0–8.3)

## 2013-06-09 LAB — CBC WITH DIFFERENTIAL/PLATELET
Basophils Absolute: 0.1 10*3/uL (ref 0.0–0.1)
Basophils Relative: 1 % (ref 0–1)
Eosinophils Absolute: 0.4 10*3/uL (ref 0.0–0.7)
Eosinophils Relative: 4 % (ref 0–5)
HCT: 36.2 % (ref 36.0–46.0)
Hemoglobin: 12.4 g/dL (ref 12.0–15.0)
Lymphocytes Relative: 31 % (ref 12–46)
Lymphs Abs: 2.6 10*3/uL (ref 0.7–4.0)
MCH: 29.5 pg (ref 26.0–34.0)
MCHC: 34.3 g/dL (ref 30.0–36.0)
MCV: 86.2 fL (ref 78.0–100.0)
Monocytes Absolute: 0.6 10*3/uL (ref 0.1–1.0)
Monocytes Relative: 7 % (ref 3–12)
Neutro Abs: 4.6 10*3/uL (ref 1.7–7.7)
Neutrophils Relative %: 57 % (ref 43–77)
Platelets: 308 10*3/uL (ref 150–400)
RBC: 4.2 MIL/uL (ref 3.87–5.11)
RDW: 14.6 % (ref 11.5–15.5)
WBC: 8.2 10*3/uL (ref 4.0–10.5)

## 2013-06-09 LAB — LIPID PANEL
Cholesterol: 121 mg/dL (ref 0–200)
HDL: 47 mg/dL (ref >39)
LDL Cholesterol: 63 mg/dL (ref 0–99)
Total CHOL/HDL Ratio: 2.6 Ratio
Triglycerides: 53 mg/dL (ref <150)
VLDL: 11 mg/dL (ref 0–40)

## 2013-06-09 LAB — HEMOGLOBIN A1C
Hgb A1c MFr Bld: 7.1 % — ABNORMAL HIGH (ref <5.7)
Mean Plasma Glucose: 157 mg/dL — ABNORMAL HIGH (ref <117)

## 2013-06-13 ENCOUNTER — Ambulatory Visit (INDEPENDENT_AMBULATORY_CARE_PROVIDER_SITE_OTHER): Payer: 59 | Admitting: Family Medicine

## 2013-06-13 ENCOUNTER — Encounter: Payer: Self-pay | Admitting: Family Medicine

## 2013-06-13 VITALS — BP 126/72 | HR 91 | Resp 18 | Ht 64.5 in | Wt 297.0 lb

## 2013-06-13 DIAGNOSIS — E785 Hyperlipidemia, unspecified: Secondary | ICD-10-CM

## 2013-06-13 DIAGNOSIS — G4733 Obstructive sleep apnea (adult) (pediatric): Secondary | ICD-10-CM | POA: Insufficient documentation

## 2013-06-13 DIAGNOSIS — G5603 Carpal tunnel syndrome, bilateral upper limbs: Secondary | ICD-10-CM

## 2013-06-13 DIAGNOSIS — E669 Obesity, unspecified: Secondary | ICD-10-CM

## 2013-06-13 DIAGNOSIS — E109 Type 1 diabetes mellitus without complications: Secondary | ICD-10-CM

## 2013-06-13 DIAGNOSIS — G4730 Sleep apnea, unspecified: Secondary | ICD-10-CM

## 2013-06-13 DIAGNOSIS — I10 Essential (primary) hypertension: Secondary | ICD-10-CM

## 2013-06-13 DIAGNOSIS — G5600 Carpal tunnel syndrome, unspecified upper limb: Secondary | ICD-10-CM

## 2013-06-13 MED ORDER — POTASSIUM CHLORIDE CRYS ER 10 MEQ PO TBCR: EXTENDED_RELEASE_TABLET | ORAL | Status: DC

## 2013-06-13 MED ORDER — METFORMIN HCL 1000 MG PO TABS: ORAL_TABLET | ORAL | Status: DC

## 2013-06-13 MED ORDER — DAPAGLIFLOZIN PROPANEDIOL 5 MG PO TABS: 1.0000 | ORAL_TABLET | Freq: Every day | ORAL | Status: DC

## 2013-06-13 MED ORDER — FUROSEMIDE 20 MG PO TABS: ORAL_TABLET | ORAL | Status: DC

## 2013-06-13 MED ORDER — LOSARTAN POTASSIUM 100 MG PO TABS: ORAL_TABLET | ORAL | Status: DC

## 2013-06-13 MED ORDER — ROSUVASTATIN CALCIUM 10 MG PO TABS: 10.0000 mg | ORAL_TABLET | Freq: Every day | ORAL | Status: DC

## 2013-06-13 NOTE — Progress Notes (Signed)
  Subjective:    [name], female    DOB: [birth year], 52 y.o.   MRN: [id number]  HPI The PT is here for follow up and re-evaluation of chronic medical conditions, medication management and review of any available recent lab and radiology data.  Preventive health is updated, specifically  Cancer screening and Immunization.still holding on her colonoscopy, unsure why . Staes she is seeking a new gyne   The PT denies any adverse reactions to current medications since the last visit.  C/o worsening hand pain and weakness esp in right hand, diagnosed with carpal tunnel syndrome, now ready to get help for this Concerned about low sugar episodes, eating is still not as structured as it should be, leaves home without breakfast She has been more comited to regular exercise, and is disappointed re lack of weight loss, but excited re improved sugar. After much discussion,Ii am discontinuing the short acting insulin and adding farxiga, i think she will benefit. S/E were discussed and she knows that she can call if needed Has chronic fatigue and excessive fatigue, last  Was years ago, agrees to a new one      Review of Systems See HPI Denies recent fever or chills. Denies sinus pressure, nasal congestion, ear pain or sore throat. Denies chest congestion, productive cough or wheezing. Denies chest pains, palpitations and leg swelling Denies abdominal pain, nausea, vomiting,diarrhea or constipation.   Denies dysuria, frequency, hesitancy or incontinence. Denies headaches, seizures, numbness, or tingling. Denies depression, anxiety or insomnia. Denies skin break down or rash.        Objective:   Physical Exam  Patient alert and oriented and in no cardiopulmonary distress.  HEENT: No facial asymmetry, EOMI, no sinus tenderness,  oropharynx pink and moist.  Neck supple no adenopathy.  Chest: Clear to auscultation bilaterally.  CVS: S1, S2 no murmurs, no S3.  ABD: Soft non  tender. Bowel sounds normal.  Ext: No edema  MS: Adequate ROM spine, shoulders, hips and knees.  Skin: Intact, no ulcerations or rash noted.  Psych: Good eye contact, normal affect. Memory intact not anxious or depressed appearing.  CNS: CN 2-12 intact, power, tone and sensation normal throughout.       Assessment & Plan:

## 2013-06-13 NOTE — Patient Instructions (Addendum)
Congrats on marked improvement in blood sugars  Continue metformin and byetta as before. REDUCE lantus to 50 units daily STOP novolog NEW is farxiga 5mg  one daily in the morning  Test sugar 3 times daily and record  Fasting range, also before any meal  is 80 to 130 Two hours after any meal or at bedtime is 140 to 180  Plan to EAT REGULARLY AT SAME TIME EVERY DAY  HEALTHY SNACKS ok BETWEEN MEALS, FRESH  VEGETABLE, FRUIT, FEW NUTS  Drink 64 OUNCES WATER DAILY  wALK 30 MINUTES EVERY DAY  CALL with questions or concerns  Bring log to visit in 2 weeks  Reduced dose of crestor to 10mg  daiily when you finish what you have  You are referred to Dr Amanda Pea re carpal tunnel   You are referred to dr Gerilyn Pilgrim re sleep apnea eval

## 2013-06-14 NOTE — Assessment & Plan Note (Signed)
Medication dose reduced, due to low values Pt to continue to follow low fat diet

## 2013-06-14 NOTE — Assessment & Plan Note (Signed)
Unchanged. Patient re-educated about  the importance of commitment to a  minimum of 150 minutes of exercise per week. The importance of healthy food choices with portion control discussed. Encouraged to start a food diary, count calories and to consider  joining a support group. Sample diet sheets offered. Goals set by the patient for the next several months.    

## 2013-06-14 NOTE — Assessment & Plan Note (Signed)
progressive and disabling pain and debility right greater than left hand, wants help referred to hand surgeon for eval and treatment

## 2013-06-14 NOTE — Assessment & Plan Note (Signed)
Needs re testing, and importance of compliance with treatment is stressed

## 2013-06-14 NOTE — Assessment & Plan Note (Signed)
Controlled, no change in medication DASH diet and commitment to daily physical activity for a minimum of 30 minutes discussed and encouraged, as a part of hypertension management. The importance of attaining a healthy weight is also discussed.  

## 2013-06-14 NOTE — Assessment & Plan Note (Addendum)
Improved, will try to add farxiga and reduce insulin use. Pt motivated to healthy eating and exercise, concerned about weight gain Patient advised to reduce carb and sweets, commit to regular physical activity, take meds as prescribed, test blood as directed, and attempt to lose weight, to improve blood sugar control.

## 2013-06-16 ENCOUNTER — Telehealth: Payer: Self-pay | Admitting: Family Medicine

## 2013-06-16 NOTE — Telephone Encounter (Signed)
Called in yesterday pm concerned re blood sugars, and "headache  Fasting on Saturday 159, bedtime 329, but had melon at nigh, 1 hr before testing. Yesterday am fasting 174, and pre lunch 206, called this am fasting 174. Pt advised to increase farxiga to 10mg  daily, keep other meds as before. Encouraged to drink a lot of water and commit to walking total per day She is to call back with furhter concerns

## 2013-06-19 ENCOUNTER — Telehealth: Payer: Self-pay | Admitting: Family Medicine

## 2013-06-19 ENCOUNTER — Other Ambulatory Visit: Payer: Self-pay | Admitting: Family Medicine

## 2013-06-19 MED ORDER — FLUCONAZOLE 150 MG PO TABS: ORAL_TABLET | ORAL | Status: DC

## 2013-06-19 NOTE — Telephone Encounter (Signed)
Sent in pls let her know 

## 2013-06-25 ENCOUNTER — Ambulatory Visit (INDEPENDENT_AMBULATORY_CARE_PROVIDER_SITE_OTHER): Payer: 59 | Admitting: Family Medicine

## 2013-06-25 ENCOUNTER — Encounter: Payer: Self-pay | Admitting: Family Medicine

## 2013-06-25 VITALS — BP 126/62 | HR 80 | Resp 18 | Ht 64.5 in | Wt 290.1 lb

## 2013-06-25 DIAGNOSIS — E109 Type 1 diabetes mellitus without complications: Secondary | ICD-10-CM

## 2013-06-25 DIAGNOSIS — E669 Obesity, unspecified: Secondary | ICD-10-CM

## 2013-06-25 DIAGNOSIS — I10 Essential (primary) hypertension: Secondary | ICD-10-CM

## 2013-06-25 MED ORDER — INSULIN GLARGINE 100 UNIT/ML SOLOSTAR PEN: 60.0000 [IU] | PEN_INJECTOR | Freq: Every day | SUBCUTANEOUS | Status: DC

## 2013-06-25 MED ORDER — DAPAGLIFLOZIN PROPANEDIOL 10 MG PO TABS: 1.0000 | ORAL_TABLET | Freq: Every day | ORAL | Status: DC

## 2013-06-25 NOTE — Patient Instructions (Addendum)
F/U after October 30  Call if you need me before  Increase Lantus to 55 units for next 7 days, may need to increase to 58 units then 60 units on a weekly basis.  PLEASE count carbs with meals, and also start walking for 20 minutes every day  CONGRATS on weight loss and blood sugars  Goal for FBG  90 to 135, same values expected for before each meal    Two hours after any meal or at bedtime , goal is 140 to 180  HBA1C, and chem 7 and EGFR, non fasting October 28 or after  Refill will be sent in on Byetta and also fluconazole  You DO need your colonoscopy, as you agreed , i will let Dr Karilyn Cota know that you plan to have this done and would want him to do the procedure

## 2013-06-25 NOTE — Progress Notes (Signed)
  Subjective:    [name], female    DOB: 12/28/1960, 52 y.o.   MRN: [id number]  HPI Pt in primarily for review of uncontrolled IDDM with change to new medication farxiga. Initially she was nervous, called several times re blood sugar level;s, but never experienced light headedness. She has noted increased urination and vaginal itch, she is pleased with weight loss. Still needs to be diligent with exercise and diet Still choosing to delay colonoscopy.   Review of Systems See HPI Denies recent fever or chills. Denies sinus pressure, nasal congestion, ear pain or sore throat. Denies chest congestion, productive cough or wheezing. Denies chest pains, palpitations and leg swelling Denies abdominal pain, nausea, vomiting,diarrhea or constipation.   Denies dysuria, frequency, hesitancy or incontinence.  Denies headaches, seizures, numbness, or tingling. Denies depression, anxiety or insomnia. Denies skin break down or rash.        Objective:   Physical Exam  Patient alert and oriented and in no cardiopulmonary distress.  HEENT: No facial asymmetry, EOMI, no sinus tenderness,  oropharynx pink and moist.  Neck supple no adenopathy.  Chest: Clear to auscultation bilaterally.  CVS: S1, S2 no murmurs, no S3.  ABD: Soft non tender. Bowel sounds normal.  Ext: No edema  MS: Adequate ROM spine, shoulders, hips and knees.  Skin: Intact, no ulcerations or rash noted.  Psych: Good eye contact, normal affect. Memory intact not anxious or depressed appearing.  CNS: CN 2-12 intact, power, tone and sensation normal throughout.       Assessment & Plan:

## 2013-06-26 ENCOUNTER — Other Ambulatory Visit: Payer: Self-pay | Admitting: Family Medicine

## 2013-06-26 MED ORDER — FLUCONAZOLE 150 MG PO TABS: ORAL_TABLET | ORAL | Status: DC

## 2013-06-26 MED ORDER — EXENATIDE 10 MCG/0.04ML ~~LOC~~ SOPN: 10.0000 ug | PEN_INJECTOR | Freq: Two times a day (BID) | SUBCUTANEOUS | Status: DC

## 2013-06-28 NOTE — Assessment & Plan Note (Signed)
Controlled, no change in medication DASH diet and commitment to daily physical activity for a minimum of 30 minutes discussed and encouraged, as a part of hypertension management. The importance of attaining a healthy weight is also discussed.  

## 2013-06-28 NOTE — Assessment & Plan Note (Signed)
Tolerating farxiga, pleased with weight loss. Log shows blood sugar values to be higher than desired, will increase lantus dose. Pt also encouraged to be diligent with diet and exercise. She is to call with concerns

## 2013-06-28 NOTE — Assessment & Plan Note (Signed)
Improved. Pt applauded on succesful weight loss through lifestyle change, and encouraged to continue same. Weight loss goal set for the next several months. Benefit of farxiga showing!

## 2013-07-11 ENCOUNTER — Ambulatory Visit (INDEPENDENT_AMBULATORY_CARE_PROVIDER_SITE_OTHER): Payer: 59 | Admitting: Ophthalmology

## 2013-07-11 DIAGNOSIS — H4310 Vitreous hemorrhage, unspecified eye: Secondary | ICD-10-CM

## 2013-07-11 DIAGNOSIS — E1139 Type 2 diabetes mellitus with other diabetic ophthalmic complication: Secondary | ICD-10-CM

## 2013-07-11 DIAGNOSIS — I10 Essential (primary) hypertension: Secondary | ICD-10-CM

## 2013-07-11 DIAGNOSIS — H43819 Vitreous degeneration, unspecified eye: Secondary | ICD-10-CM

## 2013-07-11 DIAGNOSIS — H35039 Hypertensive retinopathy, unspecified eye: Secondary | ICD-10-CM

## 2013-07-11 DIAGNOSIS — E11359 Type 2 diabetes mellitus with proliferative diabetic retinopathy without macular edema: Secondary | ICD-10-CM

## 2013-07-30 ENCOUNTER — Other Ambulatory Visit: Payer: Self-pay | Admitting: Cardiology

## 2013-07-31 ENCOUNTER — Other Ambulatory Visit (HOSPITAL_COMMUNITY): Payer: Self-pay

## 2013-07-31 DIAGNOSIS — G4730 Sleep apnea, unspecified: Secondary | ICD-10-CM

## 2013-08-05 ENCOUNTER — Encounter: Payer: Self-pay | Admitting: *Deleted

## 2013-09-11 ENCOUNTER — Other Ambulatory Visit: Payer: Self-pay

## 2013-09-11 ENCOUNTER — Telehealth: Payer: Self-pay

## 2013-09-11 DIAGNOSIS — J329 Chronic sinusitis, unspecified: Secondary | ICD-10-CM

## 2013-09-11 MED ORDER — BENZONATATE 100 MG PO CAPS: 100.0000 mg | ORAL_CAPSULE | Freq: Four times a day (QID) | ORAL | Status: DC | PRN

## 2013-09-11 NOTE — Telephone Encounter (Signed)
Yes refill x 2 pls 

## 2013-09-11 NOTE — Telephone Encounter (Signed)
Medication refill and patient aware.

## 2013-09-12 ENCOUNTER — Ambulatory Visit: Payer: 59 | Attending: Neurology | Admitting: Sleep Medicine

## 2013-09-12 DIAGNOSIS — G4733 Obstructive sleep apnea (adult) (pediatric): Secondary | ICD-10-CM | POA: Insufficient documentation

## 2013-09-12 DIAGNOSIS — G4730 Sleep apnea, unspecified: Secondary | ICD-10-CM

## 2013-09-12 DIAGNOSIS — G4761 Periodic limb movement disorder: Secondary | ICD-10-CM | POA: Insufficient documentation

## 2013-09-15 NOTE — Procedures (Signed)
[facility] Yassin Scales A. Gerilyn Pilgrim, MD     [email or web address]        Kendra Hanson, Kendra Hanson              ACCOUNT NO.:  [id number]  MEDICAL RECORD NO.:  [id number]          PATIENT TYPE:  OUT  LOCATION:  SLEEP LAB                     FACILITY:  [facility]  PHYSICIAN:  Waylon Hershey A. Gerilyn Pilgrim, M.D. DATE OF BIRTH:  October 02, 1961  DATE OF STUDY:  09/12/2013                           NOCTURNAL POLYSOMNOGRAM  REFERRING PHYSICIAN:  Louvina Cleary A. Gerilyn Pilgrim, M.D.  INDICATION:  A 52 year old who presents with obesity, snoring, and fatigue.   MEDICATIONS:  Pravastatin, insulin, potassium, losartan, metformin, Toprol, Pyridium, Crestor, Lotensin, and Tylenol PM.  EPWORTH SLEEPINESS SCALE:  14.  BMI 46.  ARCHITECTURAL SUMMARY:  The total recording time is 410 minutes.  Sleep efficiency 88%, sleep latency 8 minutes.  REM latency 106 minutes. Stage N1 10%, N2 67%, N3 8%, and REM sleep 15%.  RESPIRATORY SUMMARY:  Baseline oxygen saturation is 97, lowest saturation 79 during REM sleep.  Diagnostic AHI 16 and RDI 17.  Events occurred mostly during REM sleep with a REM AHI being 50.  Most of these events were obstructive events with marked desaturations.  LIMB MOVEMENT SUMMARY:  PLM index 18.  ELECTROCARDIOGRAM SUMMARY:  Average heart rate is 66 with no significant dysrhythmias observed.  IMPRESSION: 1. 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 2. Moderate periodic limb movement disorder sleep.  RECOMMENDATIONS:  Given the marked desaturation into the 70s, I would suggest the patient be tried on AutoPap.    Kinsley Nicklaus A. Gerilyn Pilgrim, M.D.    KAD/MEDQ  D:  09/15/2013 09:07:36  T:  09/15/2013 16:10:96  Job:  [id number]

## 2013-09-18 ENCOUNTER — Other Ambulatory Visit: Payer: Self-pay

## 2013-09-23 ENCOUNTER — Other Ambulatory Visit: Payer: Self-pay | Admitting: Family Medicine

## 2013-09-23 LAB — COMPLETE METABOLIC PANEL WITH GFR
ALT: 9 U/L (ref 0–35)
AST: 9 U/L (ref 0–37)
Albumin: 3.9 g/dL (ref 3.5–5.2)
Alkaline Phosphatase: 114 U/L (ref 39–117)
BUN: 14 mg/dL (ref 6–23)
CO2: 27 mEq/L (ref 19–32)
Calcium: 9.1 mg/dL (ref 8.4–10.5)
Chloride: 102 mEq/L (ref 96–112)
Creat: 0.88 mg/dL (ref 0.50–1.10)
GFR, Est African American: 87 mL/min
GFR, Est Non African American: 75 mL/min
Glucose, Bld: 159 mg/dL — ABNORMAL HIGH (ref 70–99)
Potassium: 4.3 mEq/L (ref 3.5–5.3)
Sodium: 140 mEq/L (ref 135–145)
Total Bilirubin: 0.5 mg/dL (ref 0.3–1.2)
Total Protein: 7.1 g/dL (ref 6.0–8.3)

## 2013-09-23 LAB — HEMOGLOBIN A1C
Hgb A1c MFr Bld: 9.2 % — ABNORMAL HIGH (ref <5.7)
Mean Plasma Glucose: 217 mg/dL — ABNORMAL HIGH (ref <117)

## 2013-09-25 ENCOUNTER — Encounter: Payer: Self-pay | Admitting: Family Medicine

## 2013-09-25 ENCOUNTER — Ambulatory Visit (INDEPENDENT_AMBULATORY_CARE_PROVIDER_SITE_OTHER): Payer: 59 | Admitting: Family Medicine

## 2013-09-25 VITALS — BP 132/70 | HR 82 | Resp 18 | Ht 64.5 in | Wt 286.0 lb

## 2013-09-25 DIAGNOSIS — J302 Other seasonal allergic rhinitis: Secondary | ICD-10-CM | POA: Insufficient documentation

## 2013-09-25 DIAGNOSIS — E109 Type 1 diabetes mellitus without complications: Secondary | ICD-10-CM

## 2013-09-25 DIAGNOSIS — J42 Unspecified chronic bronchitis: Secondary | ICD-10-CM

## 2013-09-25 DIAGNOSIS — I10 Essential (primary) hypertension: Secondary | ICD-10-CM

## 2013-09-25 DIAGNOSIS — G4730 Sleep apnea, unspecified: Secondary | ICD-10-CM

## 2013-09-25 DIAGNOSIS — E1065 Type 1 diabetes mellitus with hyperglycemia: Secondary | ICD-10-CM

## 2013-09-25 DIAGNOSIS — K649 Unspecified hemorrhoids: Secondary | ICD-10-CM

## 2013-09-25 DIAGNOSIS — J019 Acute sinusitis, unspecified: Secondary | ICD-10-CM | POA: Insufficient documentation

## 2013-09-25 DIAGNOSIS — Z1211 Encounter for screening for malignant neoplasm of colon: Secondary | ICD-10-CM

## 2013-09-25 DIAGNOSIS — E785 Hyperlipidemia, unspecified: Secondary | ICD-10-CM

## 2013-09-25 DIAGNOSIS — IMO0001 Reserved for inherently not codable concepts without codable children: Secondary | ICD-10-CM

## 2013-09-25 DIAGNOSIS — K219 Gastro-esophageal reflux disease without esophagitis: Secondary | ICD-10-CM

## 2013-09-25 DIAGNOSIS — IMO0002 Reserved for concepts with insufficient information to code with codable children: Secondary | ICD-10-CM

## 2013-09-25 DIAGNOSIS — E669 Obesity, unspecified: Secondary | ICD-10-CM

## 2013-09-25 MED ORDER — FLUCONAZOLE 150 MG PO TABS: ORAL_TABLET | ORAL | Status: AC

## 2013-09-25 MED ORDER — INSULIN ASPART PROT & ASPART (70-30 MIX) 100 UNIT/ML PEN: PEN_INJECTOR | SUBCUTANEOUS | Status: DC

## 2013-09-25 MED ORDER — HYDROCORTISONE ACETATE 30 MG RE SUPP: 1.0000 | Freq: Two times a day (BID) | RECTAL | Status: AC | PRN

## 2013-09-25 MED ORDER — MOXIFLOXACIN HCL 400 MG PO TABS: 400.0000 mg | ORAL_TABLET | Freq: Every day | ORAL | Status: AC

## 2013-09-25 NOTE — Progress Notes (Signed)
  Subjective:    [name], female    DOB: 1961-10-31, 52 y.o.   MRN: [id number]  HPI The PT is here for follow up and re-evaluation of chronic medical conditions, medication management and review of any available recent lab and radiology data.  Preventive health is updated, specifically  Cancer screening and Immunization.    The PT c/o farxiga causing nausea, and yeast infection, never called back about this, and has had excessive sweet intake , liquid , due to recent dental work. Noted FBG of 190's. 2 week h/o sinus pressure with excessive cough and yellow green nasal drainage , no fever or chills     Review of Systems See HPI  Denies chest pains, palpitations and leg swelling Denies abdominal pain, nausea, vomiting,diarrhea or constipation.  C/o rectal pressure with piles, requests medication for this Denies dysuria, frequency, hesitancy or incontinence. Denies joint pain, swelling and limitation in mobility. Denies headaches, seizures, numbness, or tingling. Denies depression, anxiety or insomnia. Denies skin break down or rash.        Objective:   Physical Exam  Patient alert and oriented and in no cardiopulmonary distress.  HEENT: No facial asymmetry, EOMI, maxillary and ethmoid sinus tenderness,  oropharynx pink and moist.  Neck supple no adenopathy.  Chest: Adequate air entry with few crackles  CVS: S1, S2 no murmurs, no S3.  ABD: Soft non tender. Bowel sounds normal.  Ext: No edema  MS: Adequate ROM spine, shoulders, hips and knees.  Skin: Intact, no ulcerations or rash noted.  Psych: Good eye contact, normal affect. Memory intact not anxious or depressed appearing.  CNS: CN 2-12 intact, power, tone and sensation normal throughout.        Assessment & Plan:

## 2013-09-25 NOTE — Patient Instructions (Addendum)
F/u in 3 month, call if you need me before  Lipid panel is added t to recent lab and result will be sent to you.  Non fasting chem 7 and EGFR and hBA1C in 3 month  Stop farxiga,  Resume novolog 6 units , twice daily and 10 to  12 units  Before dinner  Continue all other medication as before for diabetes  For sinus infection and  Bronchitis, antibiotic will be sent to the pharmacy. Continue allegra daily, and do saline flushes to nostrils.  CXR is ordered.  Topical medication will be sent in for hemorhoids.  Sugar free robitussin DM is a good cough suppressant  Call Dr Gerilyn Pilgrim and schedule follow up  appointment for abnormal sleep study   Please if blood sugars are being out of range call to get help with blood sugar management

## 2013-09-26 ENCOUNTER — Other Ambulatory Visit: Payer: Self-pay

## 2013-09-26 ENCOUNTER — Encounter: Payer: Self-pay | Admitting: Family Medicine

## 2013-09-26 DIAGNOSIS — IMO0001 Reserved for inherently not codable concepts without codable children: Secondary | ICD-10-CM

## 2013-09-26 DIAGNOSIS — E109 Type 1 diabetes mellitus without complications: Secondary | ICD-10-CM

## 2013-09-26 LAB — LIPID PANEL
Cholesterol: 146 mg/dL (ref 0–200)
HDL: 49 mg/dL (ref >39)
LDL Cholesterol: 79 mg/dL (ref 0–99)
Total CHOL/HDL Ratio: 3 Ratio
Triglycerides: 90 mg/dL (ref <150)
VLDL: 18 mg/dL (ref 0–40)

## 2013-09-26 MED ORDER — INSULIN ASPART 100 UNIT/ML FLEXPEN: PEN_INJECTOR | SUBCUTANEOUS | Status: DC

## 2013-09-27 NOTE — Assessment & Plan Note (Signed)
Antibiotic course prescribed with decongestants and cough suppressants

## 2013-09-27 NOTE — Assessment & Plan Note (Signed)
Controlled, no change in medication DASH diet and commitment to daily physical activity for a minimum of 30 minutes discussed and encouraged, as a part of hypertension management. The importance of attaining a healthy weight is also discussed.  

## 2013-09-27 NOTE — Assessment & Plan Note (Signed)
Deteriorated. Pt was intolerant of farxiga, reports yeast infection. Has had a lot going on in the interim had extensive dental work and went to drinking alot of sugary drinks. Has been aware of a fasting sugar at 190 for weeks but did not call in about this. Re iterated the need to call if blood sugar is uncontrolled. Will d/c farxiga and resume short acting insulin pre meals No regular exercise

## 2013-09-27 NOTE — Assessment & Plan Note (Signed)
Deteriorated. Patient re-educated about  the importance of commitment to a  minimum of 150 minutes of exercise per week. The importance of healthy food choices with portion control discussed. Encouraged to start a food diary, count calories and to consider  joining a support group. Sample diet sheets offered. Goals set by the patient for the next several months.    

## 2013-09-27 NOTE — Assessment & Plan Note (Signed)
Controlled, no change in medication  

## 2013-09-27 NOTE — Assessment & Plan Note (Signed)
C/o chronic cough and chest congestion with increased sinus pressure and drainage in the past 2 weeks, antibiotic course prescribed, also CXR ordered

## 2013-09-27 NOTE — Assessment & Plan Note (Signed)
Recently had an abnormal sleep study, needs follow up with Dr Gerilyn Pilgrim for treatment

## 2013-09-27 NOTE — Assessment & Plan Note (Signed)
Controlled, no change in medication Hyperlipidemia:Low fat diet discussed and encouraged.  \ 

## 2013-09-27 NOTE — Assessment & Plan Note (Signed)
Currently symptomatic, topical med prescribed for relief and educated re need to soften stool

## 2013-09-30 ENCOUNTER — Encounter (INDEPENDENT_AMBULATORY_CARE_PROVIDER_SITE_OTHER): Payer: Self-pay | Admitting: *Deleted

## 2013-10-08 ENCOUNTER — Emergency Department (HOSPITAL_COMMUNITY)
Admission: EM | Admit: 2013-10-08 | Discharge: 2013-10-08 | Disposition: A | Discharge status: Home / Self Care | Payer: 59 | Attending: Emergency Medicine | Admitting: Emergency Medicine

## 2013-10-08 ENCOUNTER — Encounter (HOSPITAL_COMMUNITY): Payer: Self-pay | Admitting: Emergency Medicine

## 2013-10-08 DIAGNOSIS — I10 Essential (primary) hypertension: Secondary | ICD-10-CM | POA: Insufficient documentation

## 2013-10-08 DIAGNOSIS — E785 Hyperlipidemia, unspecified: Secondary | ICD-10-CM | POA: Insufficient documentation

## 2013-10-08 DIAGNOSIS — Z794 Long term (current) use of insulin: Secondary | ICD-10-CM | POA: Insufficient documentation

## 2013-10-08 DIAGNOSIS — S61209A Unspecified open wound of unspecified finger without damage to nail, initial encounter: Secondary | ICD-10-CM | POA: Insufficient documentation

## 2013-10-08 DIAGNOSIS — W261XXA Contact with sword or dagger, initial encounter: Secondary | ICD-10-CM | POA: Insufficient documentation

## 2013-10-08 DIAGNOSIS — Y939 Activity, unspecified: Secondary | ICD-10-CM | POA: Insufficient documentation

## 2013-10-08 DIAGNOSIS — Y929 Unspecified place or not applicable: Secondary | ICD-10-CM | POA: Insufficient documentation

## 2013-10-08 DIAGNOSIS — E669 Obesity, unspecified: Secondary | ICD-10-CM | POA: Insufficient documentation

## 2013-10-08 DIAGNOSIS — Z79899 Other long term (current) drug therapy: Secondary | ICD-10-CM | POA: Insufficient documentation

## 2013-10-08 DIAGNOSIS — Z7982 Long term (current) use of aspirin: Secondary | ICD-10-CM | POA: Insufficient documentation

## 2013-10-08 DIAGNOSIS — S61012A Laceration without foreign body of left thumb without damage to nail, initial encounter: Secondary | ICD-10-CM

## 2013-10-08 DIAGNOSIS — E119 Type 2 diabetes mellitus without complications: Secondary | ICD-10-CM | POA: Insufficient documentation

## 2013-10-08 DIAGNOSIS — Z8742 Personal history of other diseases of the female genital tract: Secondary | ICD-10-CM | POA: Insufficient documentation

## 2013-10-08 DIAGNOSIS — W260XXA Contact with knife, initial encounter: Secondary | ICD-10-CM | POA: Insufficient documentation

## 2013-10-08 DIAGNOSIS — Z23 Encounter for immunization: Secondary | ICD-10-CM | POA: Insufficient documentation

## 2013-10-08 MED ORDER — LIDOCAINE HCL (PF) 1 % IJ SOLN
INTRAMUSCULAR | Status: AC
  Filled 2013-10-08: qty 5

## 2013-10-08 MED ORDER — LIDOCAINE HCL (PF) 1 % IJ SOLN
5.0000 mL | Freq: Once | INTRAMUSCULAR | Status: AC
  Administered 2013-10-08: 5 mL via INTRADERMAL
  Filled 2013-10-08: qty 5

## 2013-10-08 MED ORDER — TETANUS-DIPHTH-ACELL PERTUSSIS 5-2.5-18.5 LF-MCG/0.5 IM SUSP
0.5000 mL | Freq: Once | INTRAMUSCULAR | Status: AC
  Administered 2013-10-08: 0.5 mL via INTRAMUSCULAR
  Filled 2013-10-08: qty 0.5

## 2013-10-08 MED ORDER — DOUBLE ANTIBIOTIC 500-10000 UNIT/GM EX OINT
TOPICAL_OINTMENT | Freq: Once | CUTANEOUS | Status: AC
  Administered 2013-10-08: 1 via TOPICAL
  Filled 2013-10-08: qty 1

## 2013-10-08 NOTE — ED Notes (Signed)
Lt thumb lac on kitchen knife. Lac to distal phalanx No active bleeding.

## 2013-10-08 NOTE — ED Provider Notes (Signed)
Medical screening examination/treatment/procedure(s) were performed by non-physician practitioner and as supervising physician I was immediately available for consultation/collaboration.  EKG Interpretation   None       Devoria Albe, MD, Armando Gang   Ward Givens, MD 10/08/13 [phone]

## 2013-10-08 NOTE — ED Provider Notes (Signed)
CSN: [id number]     Arrival date & time 10/08/13  2149 History   First MD Initiated Contact with Patient 10/08/13 2152     Chief Complaint  Patient presents with  . Laceration   (Consider location/radiation/quality/duration/timing/severity/associated sxs/prior Treatment) Patient is a 52 y.o. female presenting with skin laceration. The history is provided by the patient.  Laceration Location:  Finger Finger laceration location:  L thumb Length (cm):  2 Depth:  Through dermis Quality comment:  Flap Bleeding: controlled   Time since incident:  3 hours Laceration mechanism:  Knife Pain details:    Quality:  Throbbing   Severity:  Mild   Timing:  Constant   Progression:  Unchanged Foreign body present:  No foreign bodies Relieved by:  Nothing Worsened by:  Movement Ineffective treatments:  None tried Tetanus status:  Out of date   Past Medical History  Diagnosis Date  . Insulin dependent diabetes mellitus 1996  . Hypertension 2008    Normal CBC and CMet in 2012; negative stress nuclear in 2006- patient asymptomatic  . Obesity   . Hyperlipidemia 2008    Lipid profile in 04/2011:136, 53, 43  . Multiple allergies     perennial  . Fibroids     Uterine   Past Surgical History  Procedure Laterality Date  . Refractive surgery  2011    Bilateral, two seperate occasions first in 2006  . Retinal detachment surgery  05/2005   Family History  Problem Relation Age of Onset  . Hypertension Mother   . Diabetes Mother   . Kidney failure Mother   . Stroke Mother   . Kidney failure Father   . Diabetes Father   . Heart attack Father   . COPD Sister   . Diabetes Brother   . Diabetes Brother   . Diabetes Brother   . Diabetes Brother   . Diabetes Brother   . Hypertension Brother    History  Substance Use Topics  . Smoking status: Never Smoker   . Smokeless tobacco: Not on file  . Alcohol Use: No   OB History   Grav Para Term Preterm Abortions TAB SAB Ect Mult Living              Review of Systems  Constitutional: Negative for fever and chills.  Musculoskeletal: Negative for arthralgias, back pain and joint swelling.  Skin: Positive for wound.       Laceration   Neurological: Negative for dizziness, weakness and numbness.  Hematological: Does not bruise/bleed easily.  All other systems reviewed and are negative.    Allergies  Review of patient's allergies indicates no known allergies.  Home Medications   Current Outpatient Rx  Name  Route  Sig  Dispense  Refill  . aspirin 81 MG tablet   Oral   Take 81 mg by mouth daily.           . benzonatate (TESSALON PERLES) 100 MG capsule   Oral   Take 1 capsule (100 mg total) by mouth every 6 (six) hours as needed for cough.   30 capsule   2   . Blood Glucose Monitoring Suppl (ONETOUCH VERIO IQ SYSTEM) W/DEVICE KIT   Does not apply   1 kit by Does not apply route once.   1 kit   0   . diltiazem (TAZTIA XT) 360 MG 24 hr capsule      TAKE 1 CAPSULE BY MOUTH ONCE DAILY   90 capsule   1   .  ergocalciferol (VITAMIN D2) 50000 UNITS capsule   Oral   Take 1 capsule (50,000 Units total) by mouth once a week. One capsule once weekly   12 capsule   1   . esomeprazole (NEXIUM) 20 MG capsule   Oral   Take 1 capsule (20 mg total) by mouth daily.   90 capsule   1   . exenatide (BYETTA) 10 MCG/0.04ML SOPN injection   Subcutaneous   Inject 0.04 mL (10 mcg total) into the skin 2 (two) times daily with a meal.   3 pen   3   . fexofenadine (ALLEGRA) 180 MG tablet   Oral   Take 180 mg by mouth daily.           . fluconazole (DIFLUCAN) 150 MG tablet      One tablet once daily, as needed, for vaginal itch from antibiotic therapy   2 tablet   0   . furosemide (LASIX) 20 MG tablet      TAKE 1 TABLET BY MOUTH 2 TIMES DAILY.   180 tablet   1   . gabapentin (NEURONTIN) 100 MG capsule   Oral   Take 100 mg by mouth at bedtime.         Marland Kitchen glucose blood test strip      Use as instructed  three times daily with one touch verio IQ meter   100 each   12   . HYDROCORTISONE ACE, RECTAL, 30 MG SUPP   Rectal   Place 1 suppository (30 mg total) rectally 2 (two) times daily as needed (as needed for itching , pain or swelling).   24 suppository   0   . ibuprofen (ADVIL,MOTRIN) 800 MG tablet      TAKE 1 TABLET BY MOUTH 3 TIMES A DAY AS NEEDED ONLY (60 TO LAST 90 DAYS)   60 tablet   2   . insulin aspart (NOVOLOG FLEXPEN) 100 UNIT/ML SOPN FlexPen      6 units before breakfast, 6 units before lunch, and 12 units before dinner   15 mL   2   . Insulin Glargine (LANTUS SOLOSTAR) 100 UNIT/ML SOPN   Subcutaneous   Inject 60 Units into the skin daily before breakfast.   15 mL   5   . Lancet Device MISC      Use with verio IQ meter three times daily to test blood sugar    100 each   PRN   . losartan (COZAAR) 100 MG tablet      TAKE 1 TABLET BY MOUTH DAILY.   90 tablet   1   . metFORMIN (GLUCOPHAGE) 1000 MG tablet      TAKE 1 TABLET BY MOUTH 2 TIMES DAILY WITH A MEAL.   180 tablet   1   . metoprolol succinate (TOPROL-XL) 100 MG 24 hr tablet      TAKE 1/2 TABLET BY MOUTH DAILY   30 tablet   0     Will need to start getting medication filled at Pr ...   . ONETOUCH VERIO test strip      USE WITH VERIO IQ METER 3 TIMES DAILY TO TEST BLOOD GLUCOSE   100 each   PRN   . potassium chloride (KLOR-CON M10) 10 MEQ tablet      TAKE 2 TABLETS BY MOUTH ONCE DAILY   180 tablet   1   . pyridoxine (B-6) 100 MG tablet   Oral   Take 100 mg by mouth daily.         Marland Kitchen  rosuvastatin (CRESTOR) 10 MG tablet   Oral   Take 1 tablet (10 mg total) by mouth at bedtime.   90 tablet   1     Dose reduction fefective 06/13/2013    BP 135/57  Pulse 85  Temp(Src) 98.4 F (36.9 C) (Oral)  Resp 20  Ht 5' 4.5" (1.638 m)  Wt 285 lb (129.275 kg)  BMI 48.18 kg/m2  SpO2 98% Physical Exam  Nursing note and vitals reviewed. Constitutional: She is oriented to person, place,  and time. She appears well-developed and well-nourished. No distress.  HENT:  Head: Normocephalic and atraumatic.  Cardiovascular: Normal rate, regular rhythm, normal heart sounds and intact distal pulses.   No murmur heard. Pulmonary/Chest: Effort normal and breath sounds normal. No respiratory distress. She exhibits no tenderness.  Musculoskeletal: Normal range of motion. She exhibits no edema.       Left hand: She exhibits tenderness and laceration. She exhibits normal range of motion, no bony tenderness, normal two-point discrimination, normal capillary refill, no deformity and no swelling. Normal sensation noted. Normal strength noted.       Hands: Neurological: She is alert and oriented to person, place, and time. She exhibits normal muscle tone. Coordination normal.  Skin: Skin is warm.    ED Course  Procedures (including critical care time) Labs Review Labs Reviewed - No data to display Imaging Review No results found.  EKG Interpretation   None       MDM    LACERATION REPAIR Performed by: Dulcey Riederer L. Authorized by: Maxwell Caul Consent: Verbal consent obtained. Risks and benefits: risks, benefits and alternatives were discussed Consent given by: patient Patient identity confirmed: provided demographic data Prepped and Draped in normal sterile fashion Wound explored  Laceration Location: left distal thumb Laceration Length: 2 cm  No Foreign Bodies seen or palpated  Anesthesia: local infiltration  Local anesthetic: lidocaine 1% w/o epinephrine  Anesthetic total: 2 ml  Irrigation method: syringe Amount of cleaning: standard  Skin closure: 4-0 prolene Number of sutures: 3  Technique:simple interrupted  Patient tolerance: Patient tolerated the procedure well with no immediate complications.   TDaP updated.  Pt has full ROM of the finger. Distal sensation intact.  Bleeding controlled prior to wound closure.  No muscle , nerve or tendon injury  seen.    Pt agrees to return for any signs of infection.    Court Gracia L. Trisha Mangle, PA-C 10/08/13 2243

## 2013-10-13 ENCOUNTER — Other Ambulatory Visit: Payer: Self-pay | Admitting: Family Medicine

## 2013-10-13 DIAGNOSIS — Z139 Encounter for screening, unspecified: Secondary | ICD-10-CM

## 2013-10-14 ENCOUNTER — Other Ambulatory Visit: Payer: Self-pay | Admitting: Adult Health

## 2013-11-10 ENCOUNTER — Ambulatory Visit (HOSPITAL_COMMUNITY)
Admission: RE | Admit: 2013-11-10 | Discharge: 2013-11-10 | Disposition: A | Discharge status: Home / Self Care | Payer: 59 | Source: Ambulatory Visit | Attending: Family Medicine | Admitting: Family Medicine

## 2013-11-10 ENCOUNTER — Ambulatory Visit (HOSPITAL_COMMUNITY): Payer: 59

## 2013-11-10 DIAGNOSIS — Z139 Encounter for screening, unspecified: Secondary | ICD-10-CM

## 2013-11-10 DIAGNOSIS — Z1231 Encounter for screening mammogram for malignant neoplasm of breast: Secondary | ICD-10-CM | POA: Insufficient documentation

## 2013-11-12 ENCOUNTER — Other Ambulatory Visit: Payer: Self-pay

## 2013-11-12 MED ORDER — DILTIAZEM HCL ER BEADS 360 MG PO CP24: ORAL_CAPSULE | ORAL | Status: DC

## 2013-12-04 ENCOUNTER — Telehealth: Payer: Self-pay

## 2013-12-05 ENCOUNTER — Encounter: Payer: Self-pay | Admitting: Family Medicine

## 2013-12-05 NOTE — Telephone Encounter (Signed)
Message left on patient's phone , that a response will be sent in e chart, she will be advised to use sugar free robitussin dm

## 2013-12-08 NOTE — Telephone Encounter (Signed)
This message was sentr 3 days ago and is closed

## 2013-12-10 ENCOUNTER — Other Ambulatory Visit: Payer: Self-pay | Admitting: Family Medicine

## 2013-12-22 LAB — HEMOGLOBIN A1C
Hgb A1c MFr Bld: 7.4 % — ABNORMAL HIGH (ref <5.7)
Mean Plasma Glucose: 166 mg/dL — ABNORMAL HIGH (ref <117)

## 2013-12-23 LAB — COMPLETE METABOLIC PANEL WITH GFR
ALT: 9 U/L (ref 0–35)
AST: 12 U/L (ref 0–37)
Albumin: 4.1 g/dL (ref 3.5–5.2)
Alkaline Phosphatase: 98 U/L (ref 39–117)
BUN: 11 mg/dL (ref 6–23)
CO2: 29 mEq/L (ref 19–32)
Calcium: 8.9 mg/dL (ref 8.4–10.5)
Chloride: 102 mEq/L (ref 96–112)
Creat: 0.76 mg/dL (ref 0.50–1.10)
GFR, Est African American: >89 mL/min
GFR, Est Non African American: >89 mL/min
Glucose, Bld: 201 mg/dL — ABNORMAL HIGH (ref 70–99)
Potassium: 4.4 mEq/L (ref 3.5–5.3)
Sodium: 138 mEq/L (ref 135–145)
Total Bilirubin: 0.5 mg/dL (ref 0.2–1.2)
Total Protein: 6.8 g/dL (ref 6.0–8.3)

## 2013-12-24 ENCOUNTER — Other Ambulatory Visit (HOSPITAL_COMMUNITY): Payer: Self-pay

## 2013-12-24 DIAGNOSIS — G4730 Sleep apnea, unspecified: Secondary | ICD-10-CM

## 2013-12-26 ENCOUNTER — Ambulatory Visit: Payer: 59 | Attending: Neurology | Admitting: Sleep Medicine

## 2013-12-26 ENCOUNTER — Encounter: Payer: Self-pay | Admitting: Neurology

## 2013-12-26 DIAGNOSIS — G4733 Obstructive sleep apnea (adult) (pediatric): Secondary | ICD-10-CM | POA: Insufficient documentation

## 2013-12-26 DIAGNOSIS — G4730 Sleep apnea, unspecified: Secondary | ICD-10-CM

## 2013-12-26 DIAGNOSIS — Z6841 Body Mass Index (BMI) 40.0 and over, adult: Secondary | ICD-10-CM | POA: Insufficient documentation

## 2013-12-30 NOTE — Sleep Study (Signed)
Kendra A. Merlene Laughter, MD     [email or web address]          LOCATION: SLEEP LAB FACILITY: [facility]  PHYSICIAN: Quinnie Barcelo A. Merlene Hanson, M.D.   DATE OF STUDY: 12/26/2013  NOCTURNAL POLYSOMNOGRAM   REFERRING PHYSICIAN: Alaijah Gibler.  INDICATIONS: This is a 53 year old female who has a previous study documenting significant obstructive sleep apnea syndrome. This is a CPAP titration study.  MEDICATIONS:  Prior to Admission medications   Medication Sig Start Date End Date Taking? Authorizing Provider  aspirin 81 MG tablet Take 81 mg by mouth daily.      Historical Provider, MD  benzonatate (TESSALON PERLES) 100 MG capsule Take 1 capsule (100 mg total) by mouth every 6 (six) hours as needed for cough. 09/11/13 09/11/14  Fayrene Helper, MD  Blood Glucose Monitoring Suppl (ONETOUCH VERIO IQ SYSTEM) W/DEVICE KIT 1 kit by Does not apply route once. 11/22/11   Fayrene Helper, MD  diltiazem (CARDIZEM CD) 360 MG 24 hr capsule Take 360 mg by mouth daily.    Historical Provider, MD  diltiazem (TAZTIA XT) 360 MG 24 hr capsule TAKE 1 CAPSULE BY MOUTH ONCE DAILY 11/12/13   Fayrene Helper, MD  esomeprazole (NEXIUM) 20 MG capsule Take 1 capsule (20 mg total) by mouth daily. 01/31/13 01/31/14  Fayrene Helper, MD  exenatide (BYETTA) 10 MCG/0.04ML SOPN injection Inject 0.04 mL (10 mcg total) into the skin 2 (two) times daily with a meal. 06/26/13   Fayrene Helper, MD  fexofenadine (ALLEGRA) 180 MG tablet Take 180 mg by mouth daily.      Historical Provider, MD  furosemide (LASIX) 20 MG tablet TAKE 1 TABLET BY MOUTH 2 TIMES DAILY. 06/13/13   Fayrene Helper, MD  gabapentin (NEURONTIN) 100 MG capsule Take 100 mg by mouth at bedtime.    Historical Provider, MD  glucose blood test strip Use as instructed three times daily with one touch verio IQ meter 05/08/13   Fayrene Helper, MD  ibuprofen (ADVIL,MOTRIN) 800 MG tablet TAKE 1 TABLET BY MOUTH 3 TIMES A DAY AS NEEDED ONLY (60 TO LAST 90  DAYS) 01/16/13   Fayrene Helper, MD  insulin aspart (NOVOLOG FLEXPEN) 100 UNIT/ML SOPN FlexPen 6 units before breakfast, 6 units before lunch, and 12 units before dinner 09/26/13   Fayrene Helper, MD  Insulin Glargine (LANTUS SOLOSTAR) 100 UNIT/ML SOPN Inject 60 Units into the skin daily before breakfast. 06/25/13 06/25/14  Fayrene Helper, MD  Lancet Device MISC Use with verio IQ meter three times daily to test blood sugar  05/08/13   Fayrene Helper, MD  losartan (COZAAR) 100 MG tablet TAKE 1 TABLET BY MOUTH DAILY. 06/13/13   Fayrene Helper, MD  metFORMIN (GLUCOPHAGE) 1000 MG tablet TAKE 1 TABLET BY MOUTH 2 TIMES DAILY WITH A MEAL. 06/13/13   Fayrene Helper, MD  metoprolol succinate (TOPROL-XL) 100 MG 24 hr tablet TAKE 1/2 TABLET BY MOUTH DAILY 10/14/13   Lendon Colonel, NP  [facility] VERIO test strip USE WITH VERIO IQ METER 3 TIMES DAILY TO TEST BLOOD GLUCOSE 05/08/13   Fayrene Helper, MD  potassium chloride (K-DUR,KLOR-CON) 10 MEQ tablet TAKE 2 TABLETS BY MOUTH ONCE DAILY 12/10/13   Fayrene Helper, MD  pyridoxine (B-6) 100 MG tablet Take 100 mg by mouth daily.    Historical Provider, MD  rosuvastatin (CRESTOR) 10 MG tablet Take 1 tablet (10 mg total) by mouth at bedtime. 06/13/13 06/13/14  Norwood Levo  Moshe Cipro, MD      EPWORTH SLEEPINESS SCALE: 15.   BMI: 50.   ARCHITECTURAL SUMMARY: Total recording time was 468 minutes. Sleep efficiency 77 %. Sleep latency 21 minutes. REM latency 239 minutes. Stage NI 5 %, N2 63 % and N3 15 % and REM sleep 16 %.    RESPIRATORY DATA:  Baseline oxygen saturation is 96 %. The lowest saturation is 89 %. The patient was placed in positive pressure starting at 5 and increase to 8. The optimal pressure is 7 with resolution of obstructive events and good tolerance.  LIMB MOVEMENT SUMMARY: PLM index 1.   ELECTROCARDIOGRAM SUMMARY: Average heart rate is 74 with no significant  dysrhythmias observed.   IMPRESSION:  1. Obstructive sleep apnea  syndrome which responds well to a CPAP of 7.  Thanks for this referral.  Kazmir Oki A. Merlene Hanson, M.D. Diplomat, Tax adviser of Sleep Medicine.

## 2014-01-01 ENCOUNTER — Encounter: Payer: Self-pay | Admitting: Family Medicine

## 2014-01-01 ENCOUNTER — Ambulatory Visit (INDEPENDENT_AMBULATORY_CARE_PROVIDER_SITE_OTHER): Payer: 59 | Admitting: Family Medicine

## 2014-01-01 VITALS — BP 124/64 | HR 77 | Resp 16 | Ht 64.5 in | Wt 300.1 lb

## 2014-01-01 DIAGNOSIS — E785 Hyperlipidemia, unspecified: Secondary | ICD-10-CM

## 2014-01-01 DIAGNOSIS — G4700 Insomnia, unspecified: Secondary | ICD-10-CM

## 2014-01-01 DIAGNOSIS — E1165 Type 2 diabetes mellitus with hyperglycemia: Principal | ICD-10-CM

## 2014-01-01 DIAGNOSIS — I10 Essential (primary) hypertension: Secondary | ICD-10-CM

## 2014-01-01 DIAGNOSIS — E559 Vitamin D deficiency, unspecified: Secondary | ICD-10-CM

## 2014-01-01 DIAGNOSIS — E1129 Type 2 diabetes mellitus with other diabetic kidney complication: Secondary | ICD-10-CM

## 2014-01-01 DIAGNOSIS — G4730 Sleep apnea, unspecified: Secondary | ICD-10-CM

## 2014-01-01 DIAGNOSIS — E669 Obesity, unspecified: Secondary | ICD-10-CM

## 2014-01-01 MED ORDER — TEMAZEPAM 15 MG PO CAPS: 15.0000 mg | ORAL_CAPSULE | Freq: Every evening | ORAL | Status: DC | PRN

## 2014-01-01 MED ORDER — TEMAZEPAM 30 MG PO CAPS: 30.0000 mg | ORAL_CAPSULE | Freq: Every evening | ORAL | Status: DC | PRN

## 2014-01-01 NOTE — Patient Instructions (Addendum)
F/u in 3 month, call if you need  Me before  Congrats on improved blood sugar  Microalb today  Please use your CPAP machine every day.  New for sleep is restoril, bedtime is no later than 10 :30 pm, and you will get info on sleep hygiene  Daily exercise for 30 mins  CBC, fasting lipid, cmp and EGFR, HBA1C and TSH  Please work on weight loss , this wiill happen with the exercise, also smaller portions of low calorie dense fooods

## 2014-01-02 NOTE — Assessment & Plan Note (Signed)
Deteriorated. Patient re-educated about  the importance of commitment to a  minimum of 150 minutes of exercise per week. The importance of healthy food choices with portion control discussed. Encouraged to start a food diary, count calories and to consider  joining a support group. Sample diet sheets offered. Goals set by the patient for the next several months.    

## 2014-01-02 NOTE — Progress Notes (Signed)
Subjective:    [name], female    DOB: 02-22-1961, 53 y.o.   MRN: [id number]  HPI The PT is here for follow up and re-evaluation of chronic medical conditions, medication management and review of any available recent lab and radiology data.  Preventive health is updated, specifically  Cancer screening and Immunization. Still has to have her colonoscopy and will have her pap next couple weeks  Recently diagnosed and re fitted with CPAP equipment, still getting accustomed ,but realizes the benefit  The PT denies any adverse reactions to current medications since the last visit.  C/o insomnia , difficulty falling asleep and staying asleep.Wants help, getting on avg 4 to 5 hrs of sleep No regular exercise and significant weight gain. C/o blood sugar falling in the mornings, down in the 70's at times. Plans to be more consistent with diet and start exercise,  No change will be made in medications at this time, blood sugar much improved currently      Review of Systems See HPI Denies recent fever or chills. Denies sinus pressure, nasal congestion, ear pain or sore throat. Denies chest congestion, productive cough or wheezing. Denies chest pains, palpitations and leg swelling Denies abdominal pain, nausea, vomiting,diarrhea or constipation.   Denies dysuria, frequency, hesitancy or incontinence. Denies joint pain, swelling and limitation in mobility. Denies headaches, seizures, numbness. Denies depression or anxiety Denies skin break down or rash.        Objective:   Physical Exam  BP 124/64  Pulse 77  Resp 16  Ht 5' 4.5" (1.638 m)  Wt 300 lb 1.9 oz (136.134 kg)  BMI 50.74 kg/m2  SpO2 95% Patient alert and oriented and in no cardiopulmonary distress.  HEENT: No facial asymmetry, EOMI, no sinus tenderness,  oropharynx pink and moist.  Neck supple no adenopathy.  Chest: Clear to auscultation bilaterally.  CVS: S1, S2 no murmurs, no S3.  ABD: Soft non  tender. Bowel sounds normal.  Ext: No edema  MS: Adequate ROM spine, shoulders, hips and knees.  Skin: Intact, no ulcerations or rash noted.  Psych: Good eye contact, normal affect. Memory intact not anxious or depressed appearing.  CNS: CN 2-12 intact, power, tone and sensation normal throughout.       Assessment & Plan:  HYPERTENSION .Controlled, no change in medication DASH diet and commitment to daily physical activity for a minimum of 30 minutes discussed and encouraged, as a part of hypertension management. The importance of attaining a healthy weight is also discussed.   Type II or unspecified type diabetes mellitus with renal manifestations, uncontrolled(250.42) Improved. Patient advised to reduce carb and sweets, commit to regular physical activity, take meds as prescribed, test blood as directed, and attempt to lose weight, to improve blood sugar control. No med change, pt to be more consistent with diet , and she is to start daily exercise  HYPERLIPIDEMIA Controlled, no change in medication Hyperlipidemia:Low fat diet discussed and encouraged.  Updated lab needed at/ before next visit.   Obesity Deteriorated. Patient re-educated about  the importance of commitment to a  minimum of 150 minutes of exercise per week. The importance of healthy food choices with portion control discussed. Encouraged to start a food diary, count calories and to consider  joining a support group. Sample diet sheets offered. Goals set by the patient for the next several months.     Unspecified sleep apnea Now appreciates the value of treatment and is adjusting to her mask  Insomnia Difficulty initiating and maintaining sleep. Sleep hygiene reviewed and written information offered also. Prescription sent for  medication needed.

## 2014-01-02 NOTE — Assessment & Plan Note (Signed)
Controlled, no change in medication DASH diet and commitment to daily physical activity for a minimum of 30 minutes discussed and encouraged, as a part of hypertension management. The importance of attaining a healthy weight is also discussed.  

## 2014-01-02 NOTE — Assessment & Plan Note (Signed)
Improved. Patient advised to reduce carb and sweets, commit to regular physical activity, take meds as prescribed, test blood as directed, and attempt to lose weight, to improve blood sugar control. No med change, pt to be more consistent with diet , and she is to start daily exercise

## 2014-01-02 NOTE — Assessment & Plan Note (Signed)
Now appreciates the value of treatment and is adjusting to her mask

## 2014-01-02 NOTE — Assessment & Plan Note (Signed)
Controlled, no change in medication Hyperlipidemia:Low fat diet discussed and encouraged.  Updated lab needed at/ before next visit.  

## 2014-01-02 NOTE — Assessment & Plan Note (Signed)
Difficulty initiating and maintaining sleep. Sleep hygiene reviewed and written information offered also. Prescription sent for  medication needed.

## 2014-01-03 ENCOUNTER — Encounter: Payer: Self-pay | Admitting: Family Medicine

## 2014-01-03 LAB — MICROALBUMIN / CREATININE URINE RATIO
Creatinine, Urine: 187.9 mg/dL
Microalb Creat Ratio: 68 mg/g — ABNORMAL HIGH (ref 0.0–30.0)
Microalb, Ur: 12.77 mg/dL — ABNORMAL HIGH (ref 0.00–1.89)

## 2014-01-05 ENCOUNTER — Ambulatory Visit (INDEPENDENT_AMBULATORY_CARE_PROVIDER_SITE_OTHER): Payer: Self-pay | Admitting: Family Medicine

## 2014-01-05 VITALS — Ht 64.0 in | Wt 300.0 lb

## 2014-01-05 DIAGNOSIS — E119 Type 2 diabetes mellitus without complications: Secondary | ICD-10-CM

## 2014-01-05 NOTE — Progress Notes (Signed)
Subjective:  Patient presents today for an annual pharmacy visit as part of the employer-sponsored [employer] program. Current diabetes regimen includes Lantus 60 units once daily, Novolog 10-12 units with meals, metformin 1000 mg BID and Byetta 10 mcg BID with meals. Patient also continues on daily ASA, ARB, and statin.   Patient had a sleep study a couple of weeks ago and she meets with her doctor this week to go over the results. She is hoping to get a CPAP machine. She had one previously but did not like the mask and so she turned in the machine. She is hoping to get a mask that fits better and is more comfortable.   She states that she is waking up 2-3 times a week with low blood sugars- she states that she has been as low as 37 or 40. She thinks this is because she is not eating enough dinner and she is still taking the full dose of Novolog (12 units).   She had an appointment with Dr. Moshe Cipro last week and her A1C was 7.4%. This was an improvement from her last A1C which was 9.2%.    Disease Assessments:  Diabetes:  Type of Diabetes: Type 2; Sees Diabetes provider 4 or more times per year; MD managing Diabetes simpson; checks feet daily; uses glucometer; takes medications as prescribed; takes an aspirin a day; Current Diabetes related medical conditions are High blood pressure, High cholesterol; 7 day CBG average 143; 14 day CBG average 131; 30 day CBG average 140; hypoglycemia frequency 2-3 times a week; Highest CBG 398; Lowest CBG 47; checks blood glucose 2 times a day; Other Diabetes History:  Checks once daily in the morning- fasting.  Most recent A1C was 7.4% with Dr. Moshe Cipro.   She states she is having low blood sugars in the middle of the night. She thinks this is because she is not eating enough for dinner. We discussed decreasing Novolog by 1-2 units at dinner time if she eats a light or low carbohydrate dinner. Based on her meter I saw a few readings in the 70s, but not at the  frequency of 2-3 times a week.   She had several readings on her meter that were in the 200-300 range. We discussed correcting these high readings with an additional 2 units of Novolog at the next meal and then checking CBG before the next meal to see if CBG had improved to less than 180.    Nutrition-  Typical Day  B- [employer] Essential breakfast shakes with fruit and yogurt.   L- smart ones frozen meal, cutie orange or another type of fruit.   snacks- walnuts, pistachios, peanuts   eating vegetables during the day- green beans, cabbage.   D- eating leftovers.   drinking juice. States that she doesn't like water. Lately she has been trying the crystal light packets and that has been helping. Doesn't want to drink diet sodas.   Physical Activity-  Right now nothing. She wants to join the rec center. She also wants to start walking.    Preventive Care:    Hemoglobin A1c: 12/22/2013 via Epic, Dr. Griffin Dakin office 7.4    Flu vaccine: 09/26/2013  Foot Exam: 09/25/2013  Last mammography: 11/10/2013  Pneumovax: 09/19/2011  Other Preventive Care Notes: sleep study done 09/12/13 showed mild to moderate obstructive sleep apnea with desats to 70%, autopap was recommended by Dr. Merlene Laughter   Assessment/Plan: Patient is a 53 year old female with DM2. Most recent A1C was  7.4% which is above goal of less than 7%, but is a 2 point improvement from her last A1C of 9.2%. She is taking Lantus, Novolog and Byetta consistently.   Spent the majority of the visit discussing carbohydrate counting and how to read food labels. She states that over the past two weeks she has been more motivated to eat better and she has been eating smaller portions. We reviewed a 24 food recall. Some meals she is eating 75+ grams of carbohydrates. We reviewed carbohydrate goals of 45-60 grams per meal and how to estimate serving sizes. Patient states she is comfortable with reading food labels. We also discussed using  google and myfitnesspal to get nutrition information for foods.   Patient is not engaging in any physical activity now but states that she wants to start walking. She states that her biggest roadblock is mentally getting ready to walk when she is tired. We reviewed the benefits of regular physical activity and patient wants to start walking three days a week.   Patient is experiencing some low blood sugars at night and at other times during the day. We discussed decreasing Novolog doses by 2 units when she doesn't eat a large meal.   Patient will follow up with Kelli Churn, RNCM.Marland Kitchen    Goals for Next Visit-  1. Start walking for 30 minutes three days a week- Monday, Wednesday, Friday.  2. While you are at work- walk the stairs.  3. Changes to make to your eating habits- look at the nutrition labels. Aim for 45-60 grams of carbohydrates with each meal. 15 grams of carbohydrates with a snack. Breakfast- try the greek whips from [place] since they have less carbohydrates.  4. When you don't eat a large meal or you don't have as many carbohydrates, decrease your Novolog dose by 2 units. This should help decrease how often your blood sugar is going low.  5. Follow up with your doctor about the CPAP machine.   Marcie Bal will contact you for a follow up appointment.

## 2014-01-06 ENCOUNTER — Encounter: Payer: 59 | Admitting: Advanced Practice Midwife

## 2014-01-12 ENCOUNTER — Other Ambulatory Visit: Payer: Self-pay | Admitting: Family Medicine

## 2014-01-13 ENCOUNTER — Encounter: Payer: Self-pay | Admitting: Advanced Practice Midwife

## 2014-01-13 ENCOUNTER — Ambulatory Visit (INDEPENDENT_AMBULATORY_CARE_PROVIDER_SITE_OTHER): Payer: 59 | Admitting: Advanced Practice Midwife

## 2014-01-13 VITALS — BP 130/64 | Ht 64.5 in | Wt 298.0 lb

## 2014-01-13 DIAGNOSIS — Z3046 Encounter for surveillance of implantable subdermal contraceptive: Secondary | ICD-10-CM

## 2014-01-13 DIAGNOSIS — Z3202 Encounter for pregnancy test, result negative: Secondary | ICD-10-CM

## 2014-01-13 DIAGNOSIS — Z30017 Encounter for initial prescription of implantable subdermal contraceptive: Secondary | ICD-10-CM

## 2014-01-13 NOTE — Progress Notes (Addendum)
Kendra Hanson was given informed consent for removal of her Implanon, time out was performed.  Signed copy in the chart.  Appropriate time out taken. Implanon site identified.  Area prepped in usual sterile fashon. 2 cc of 1% lidocaine was used to anesthetize the area starting with the distal end of the implant. A small stab incision was made right beside the implant on the distal portion.  The implanon rod was grasped using hemostats and removed without difficulty.  There was less than 3 cc blood loss. There were no complications. Next, the area was cleansed again and the new Nexplanon was inserted without difficulty.  Steri strips and a pressure bandage were applied.  Pt was instructed to remove pressure bandage in a few hours, and keep insertion site covered with a bandaid for 3 days.  Follow-up scheduled PRN problems  Because her mother had twins at age 32, Kendra Hanson Will use [facility] for 3 more years, then check [facility]  Kendra Hanson,Kendra Hanson 01/13/2014 4:24 PM

## 2014-01-16 LAB — POCT URINE PREGNANCY: Preg Test, Ur: NEGATIVE

## 2014-01-29 ENCOUNTER — Telehealth: Payer: Self-pay | Admitting: Family Medicine

## 2014-01-29 NOTE — Telephone Encounter (Signed)
Left message that papers would be ready after lunch and we would call her

## 2014-01-29 NOTE — Telephone Encounter (Signed)
pls let her know you will call her after lunch they will be ready then

## 2014-01-30 ENCOUNTER — Other Ambulatory Visit: Payer: Self-pay | Admitting: Family Medicine

## 2014-01-30 ENCOUNTER — Other Ambulatory Visit: Payer: Self-pay

## 2014-01-30 MED ORDER — GLUCOSE BLOOD VI STRP: ORAL_STRIP | Status: DC

## 2014-02-06 ENCOUNTER — Other Ambulatory Visit: Payer: Self-pay | Admitting: Family Medicine

## 2014-02-16 ENCOUNTER — Telehealth: Payer: Self-pay | Admitting: Family Medicine

## 2014-02-16 MED ORDER — MONTELUKAST SODIUM 10 MG PO TABS
10.0000 mg | ORAL_TABLET | Freq: Every day | ORAL | Status: DC
Start: 2014-02-16 — End: 2015-02-01

## 2014-02-16 NOTE — Telephone Encounter (Signed)
Patient states that she is having sinus drainage and cough with wheezing.  Is not taking anything on a daily basis for allergies.  Did take Allegra x 1 day.  No fever.  Sinus drainage is yellow/green.  Is also asking for an albuterol inhaler.  Please advise.  Would like to discuss with nurse before meds are sent to advise what pharmacy.

## 2014-02-16 NOTE — Addendum Note (Signed)
Addended by: Denman George B on: 02/16/2014 02:22 PM   Modules accepted: Orders

## 2014-02-16 NOTE — Telephone Encounter (Signed)
Patient aware and med sent to [facility] pharmacy x 1

## 2014-02-16 NOTE — Telephone Encounter (Signed)
No h/o asthma on file, how many days has she had sinus symptoms for? i recommend singulair daily for allergies 10mg  one daily, since she reports "wheezing" this is a safer drug to use , less c/v side effects and also treats allergies, she may use this along with the allegra daily for allergies or better combination which I recommend is the flonase spray and singulair based on her symptoms. Pls reply so I can address whether she needs abiotic or not

## 2014-02-17 ENCOUNTER — Other Ambulatory Visit: Payer: Self-pay | Admitting: Adult Health

## 2014-02-17 ENCOUNTER — Telehealth: Payer: Self-pay | Admitting: Family Medicine

## 2014-02-17 MED ORDER — AZITHROMYCIN 250 MG PO TABS: ORAL_TABLET | ORAL | Status: DC

## 2014-02-17 NOTE — Telephone Encounter (Signed)
Messages x 2 left on pt's number this morning to enquire if she had got her singulair whicvh had been sent in. She hjad called after hrs yesterday saying not at the pharmacy, but had been sent in already. I also left a msg asking that she call back if she has not got the singulair and also if her symptoms were worsening

## 2014-02-17 NOTE — Addendum Note (Signed)
Addended by: Denman George B on: 02/17/2014 04:44 PM   Modules accepted: Orders

## 2014-02-17 NOTE — Telephone Encounter (Signed)
Patient is still having problems with wheezing.  Per discussion with md. zpak sent to requested pharmacy.

## 2014-02-18 ENCOUNTER — Other Ambulatory Visit: Payer: Self-pay

## 2014-02-18 MED ORDER — METOPROLOL SUCCINATE ER 100 MG PO TB24: ORAL_TABLET | ORAL | Status: DC

## 2014-02-23 ENCOUNTER — Other Ambulatory Visit: Payer: Self-pay | Admitting: Family Medicine

## 2014-03-03 ENCOUNTER — Telehealth: Payer: Self-pay

## 2014-03-03 DIAGNOSIS — R05 Cough: Secondary | ICD-10-CM

## 2014-03-03 DIAGNOSIS — R058 Other specified cough: Secondary | ICD-10-CM

## 2014-03-03 NOTE — Telephone Encounter (Signed)
pls order CXR and sputum c/s , if she states sputum is  Green order sputum c/s please, let her know based on results will go from there. I will sign the orders

## 2014-03-04 ENCOUNTER — Ambulatory Visit (HOSPITAL_COMMUNITY)
Admission: RE | Admit: 2014-03-04 | Discharge: 2014-03-04 | Disposition: A | Discharge status: Home / Self Care | Payer: 59 | Source: Ambulatory Visit | Attending: Family Medicine | Admitting: Family Medicine

## 2014-03-04 ENCOUNTER — Encounter: Payer: Self-pay | Admitting: Family Medicine

## 2014-03-04 DIAGNOSIS — R059 Cough, unspecified: Secondary | ICD-10-CM | POA: Insufficient documentation

## 2014-03-04 DIAGNOSIS — R058 Other specified cough: Secondary | ICD-10-CM

## 2014-03-04 DIAGNOSIS — R05 Cough: Secondary | ICD-10-CM

## 2014-03-04 NOTE — Addendum Note (Signed)
Addended by: Denman George B on: 03/04/2014 11:50 AM   Modules accepted: Orders

## 2014-03-04 NOTE — Telephone Encounter (Signed)
Called and left message notifying patient of new orders.  Orders placed.

## 2014-03-08 ENCOUNTER — Encounter: Payer: Self-pay | Admitting: Family Medicine

## 2014-03-08 LAB — RESPIRATORY CULTURE OR RESPIRATORY AND SPUTUM CULTURE: Organism ID, Bacteria: NORMAL

## 2014-04-01 ENCOUNTER — Ambulatory Visit: Payer: 59 | Admitting: Family Medicine

## 2014-04-08 ENCOUNTER — Other Ambulatory Visit: Payer: Self-pay | Admitting: Family Medicine

## 2014-04-08 MED ORDER — FLUTICASONE PROPIONATE 50 MCG/ACT NA SUSP: 2.0000 | Freq: Every day | NASAL | Status: DC

## 2014-04-09 ENCOUNTER — Telehealth: Payer: Self-pay

## 2014-04-09 DIAGNOSIS — I10 Essential (primary) hypertension: Secondary | ICD-10-CM

## 2014-04-09 DIAGNOSIS — E1165 Type 2 diabetes mellitus with hyperglycemia: Principal | ICD-10-CM

## 2014-04-09 DIAGNOSIS — E785 Hyperlipidemia, unspecified: Secondary | ICD-10-CM

## 2014-04-09 DIAGNOSIS — E1129 Type 2 diabetes mellitus with other diabetic kidney complication: Secondary | ICD-10-CM

## 2014-04-09 NOTE — Telephone Encounter (Signed)
Encounter for lab order:  Patient is aware of orders

## 2014-04-10 ENCOUNTER — Ambulatory Visit (INDEPENDENT_AMBULATORY_CARE_PROVIDER_SITE_OTHER): Payer: Commercial Managed Care - PPO | Admitting: Ophthalmology

## 2014-04-29 ENCOUNTER — Ambulatory Visit: Payer: 59 | Admitting: Family Medicine

## 2014-05-19 ENCOUNTER — Other Ambulatory Visit: Payer: Self-pay | Admitting: Family Medicine

## 2014-05-26 ENCOUNTER — Other Ambulatory Visit: Payer: Self-pay | Admitting: Family Medicine

## 2014-05-30 LAB — COMPLETE METABOLIC PANEL WITH GFR
ALT: 14 U/L (ref 0–35)
AST: 13 U/L (ref 0–37)
Albumin: 3.7 g/dL (ref 3.5–5.2)
Alkaline Phosphatase: 104 U/L (ref 39–117)
BUN: 15 mg/dL (ref 6–23)
CO2: 27 mEq/L (ref 19–32)
Calcium: 8.6 mg/dL (ref 8.4–10.5)
Chloride: 104 mEq/L (ref 96–112)
Creat: 0.98 mg/dL (ref 0.50–1.10)
GFR, Est African American: 76 mL/min
GFR, Est Non African American: 66 mL/min
Glucose, Bld: 190 mg/dL — ABNORMAL HIGH (ref 70–99)
Potassium: 4.8 mEq/L (ref 3.5–5.3)
Sodium: 139 mEq/L (ref 135–145)
Total Bilirubin: 0.6 mg/dL (ref 0.2–1.2)
Total Protein: 6.8 g/dL (ref 6.0–8.3)

## 2014-05-30 LAB — LIPID PANEL
Cholesterol: 145 mg/dL (ref 0–200)
HDL: 44 mg/dL (ref >39)
LDL Cholesterol: 90 mg/dL (ref 0–99)
Total CHOL/HDL Ratio: 3.3 Ratio
Triglycerides: 55 mg/dL (ref <150)
VLDL: 11 mg/dL (ref 0–40)

## 2014-05-30 LAB — TSH: TSH: 2.015 u[IU]/mL (ref 0.350–4.500)

## 2014-05-30 LAB — HEMOGLOBIN A1C
Hgb A1c MFr Bld: 7.8 % — ABNORMAL HIGH (ref <5.7)
Mean Plasma Glucose: 177 mg/dL — ABNORMAL HIGH (ref <117)

## 2014-06-09 ENCOUNTER — Encounter: Payer: Self-pay | Admitting: Family Medicine

## 2014-06-09 ENCOUNTER — Ambulatory Visit (INDEPENDENT_AMBULATORY_CARE_PROVIDER_SITE_OTHER): Payer: 59 | Admitting: Family Medicine

## 2014-06-09 VITALS — BP 132/70 | HR 86 | Resp 20 | Ht 64.5 in | Wt 303.0 lb

## 2014-06-09 DIAGNOSIS — E1129 Type 2 diabetes mellitus with other diabetic kidney complication: Secondary | ICD-10-CM

## 2014-06-09 DIAGNOSIS — G4730 Sleep apnea, unspecified: Secondary | ICD-10-CM

## 2014-06-09 DIAGNOSIS — E785 Hyperlipidemia, unspecified: Secondary | ICD-10-CM

## 2014-06-09 DIAGNOSIS — Z733 Stress, not elsewhere classified: Secondary | ICD-10-CM

## 2014-06-09 DIAGNOSIS — Z8249 Family history of ischemic heart disease and other diseases of the circulatory system: Secondary | ICD-10-CM

## 2014-06-09 DIAGNOSIS — I10 Essential (primary) hypertension: Secondary | ICD-10-CM

## 2014-06-09 DIAGNOSIS — E1165 Type 2 diabetes mellitus with hyperglycemia: Principal | ICD-10-CM

## 2014-06-09 DIAGNOSIS — F439 Reaction to severe stress, unspecified: Secondary | ICD-10-CM

## 2014-06-09 DIAGNOSIS — E669 Obesity, unspecified: Secondary | ICD-10-CM

## 2014-06-09 NOTE — Patient Instructions (Signed)
F/u in early October, call if you need me before  Send a message in the next week re your decision about medication for stress and poor sleep, also as far as your blood sugars are concerned , please  You will get the number for employee health  Cholesterol is excellent  You are referred for heart evaluation

## 2014-06-09 NOTE — Progress Notes (Signed)
Subjective:    [name], female    DOB: [birth year], 53 y.o.   MRN: [id number]  HPI The PT is here for follow up and re-evaluation of chronic medical conditions, medication management and review of any available recent lab and radiology data.  Preventive health is updated, specifically  Cancer screening and Immunization.   The PT denies any adverse reactions to current medications since the last visit.  C/o increased stress and anxiety affecting all aspects of her life including ability to function at work, [employer] and home, a lot stemming from problems in her [place] she stated. \Tearful and crying, plans, willing to consider therapy Not actually exercising as much as she had intended, has knee pain, working on eating habits , no weight loss which she finds frustrating.C/O fatigue , and has concerns about heart disease. Father died on an MI, an she herself has multiple risk factors, will refer      Review of Systems See HPI Denies recent fever or chills. Denies sinus pressure, nasal congestion, ear pain or sore throat. Denies chest congestion, productive cough or wheezing. Intermittent chest discomfort, not specifically rel;ated to activity, non radiating, occasional palpitations , increased fatigue, no leg swelling Denies abdominal pain, nausea, vomiting,diarrhea or constipation.   Denies dysuria, frequency, hesitancy or incontinence.   Denies skin break down or rash.        Objective:   Physical Exam BP 132/70  Pulse 86  Resp 20  Ht 5' 4.5" (1.638 m)  Wt 303 lb (137.44 kg)  BMI 51.23 kg/m2  SpO2 94% Patient alert and oriented and in no cardiopulmonary distress.Tearful, anxious and crying  HEENT: No facial asymmetry, EOMI,   oropharynx pink and moist.  Neck supple no JVD, no mass.  Chest: Clear to auscultation bilaterally.  CVS: S1, S2 no murmurs, no S3.Regular rate.  ABD: Soft non tender.   Ext: No edema  MS: Adequate ROM spine, shoulders,  hips and knees.  Skin: Intact, no ulcerations or rash noted.  Psych: Good eye contact, normal affect. Memory intact  anxious , crying, not depressed appearing.  CNS: CN 2-12 intact, power,  normal throughout.no focal deficits noted.        Assessment & Plan:  Type II or unspecified type diabetes mellitus with renal manifestations, uncontrolled(250.42)  Deterior ated, med adjustments made and pt to be more diligent with lifestyle change Increased personal risk of heart disease also positive family history, will refer for cardiology eval  HYPERTENSION Controlled, no change in medication DASH diet and commitment to daily physical activity for a minimum of 30 minutes discussed and encouraged, as a part of hypertension management. The importance of attaining a healthy weight is also discussed.   HYPERLIPIDEMIA lDL at goal  Hyperlipidemia:Low fat diet discussed and encouraged.     Obesity Deteriorated. Patient re-educated about  the importance of commitment to a  minimum of 150 minutes of exercise per week. The importance of healthy food choices with portion control discussed. Encouraged to start a food diary, count calories and to consider  joining a support group. Sample diet sheets offered. Goals set by the patient for the next several months.     Unspecified sleep apnea Improved compliance needed, advised her to discuss fit of CPAP mask if this is an issue with neurologist  Stress Offered assistance through employee services, needs to vent , has a lot of anxiety, and is overwhelmed currently in all aspects of her life, she will contact me if  she decides to go through with this.No indication for medication at this time

## 2014-06-10 ENCOUNTER — Other Ambulatory Visit: Payer: Self-pay

## 2014-06-10 DIAGNOSIS — E1129 Type 2 diabetes mellitus with other diabetic kidney complication: Secondary | ICD-10-CM

## 2014-06-10 DIAGNOSIS — E1165 Type 2 diabetes mellitus with hyperglycemia: Principal | ICD-10-CM

## 2014-06-10 MED ORDER — INSULIN ASPART 100 UNIT/ML FLEXPEN: PEN_INJECTOR | SUBCUTANEOUS | Status: DC

## 2014-06-10 MED ORDER — EXENATIDE 10 MCG/0.04ML ~~LOC~~ SOPN: 10.0000 ug | PEN_INJECTOR | Freq: Two times a day (BID) | SUBCUTANEOUS | Status: DC

## 2014-06-10 MED ORDER — LANTUS SOLOSTAR 100 UNIT/ML ~~LOC~~ SOPN: PEN_INJECTOR | SUBCUTANEOUS | Status: DC

## 2014-06-23 ENCOUNTER — Encounter: Payer: Self-pay | Admitting: Family Medicine

## 2014-06-30 DIAGNOSIS — F439 Reaction to severe stress, unspecified: Secondary | ICD-10-CM | POA: Insufficient documentation

## 2014-06-30 NOTE — Assessment & Plan Note (Signed)
lDL at goal  Hyperlipidemia:Low fat diet discussed and encouraged.

## 2014-06-30 NOTE — Assessment & Plan Note (Signed)
Controlled, no change in medication DASH diet and commitment to daily physical activity for a minimum of 30 minutes discussed and encouraged, as a part of hypertension management. The importance of attaining a healthy weight is also discussed.  

## 2014-06-30 NOTE — Assessment & Plan Note (Signed)
Improved compliance needed, advised her to discuss fit of CPAP mask if this is an issue with neurologist

## 2014-06-30 NOTE — Assessment & Plan Note (Signed)
Deteriorated. Patient re-educated about  the importance of commitment to a  minimum of 150 minutes of exercise per week. The importance of healthy food choices with portion control discussed. Encouraged to start a food diary, count calories and to consider  joining a support group. Sample diet sheets offered. Goals set by the patient for the next several months.    

## 2014-06-30 NOTE — Assessment & Plan Note (Signed)
  Deterior ated, med adjustments made and pt to be more diligent with lifestyle change Increased personal risk of heart disease also positive family history, will refer for cardiology eval

## 2014-06-30 NOTE — Assessment & Plan Note (Signed)
Offered assistance through employee services, needs to vent , has a lot of anxiety, and is overwhelmed currently in all aspects of her life, she will contact me if she decides to go through with this.No indication for medication at this time

## 2014-07-09 ENCOUNTER — Ambulatory Visit (INDEPENDENT_AMBULATORY_CARE_PROVIDER_SITE_OTHER): Payer: 59 | Admitting: Cardiovascular Disease

## 2014-07-09 ENCOUNTER — Encounter: Payer: Self-pay | Admitting: Cardiovascular Disease

## 2014-07-09 ENCOUNTER — Encounter: Payer: Self-pay | Admitting: *Deleted

## 2014-07-09 VITALS — BP 122/58 | HR 82 | Ht 65.0 in | Wt 301.0 lb

## 2014-07-09 DIAGNOSIS — Z7182 Exercise counseling: Secondary | ICD-10-CM

## 2014-07-09 DIAGNOSIS — Z9119 Patient's noncompliance with other medical treatment and regimen: Secondary | ICD-10-CM

## 2014-07-09 DIAGNOSIS — R002 Palpitations: Secondary | ICD-10-CM

## 2014-07-09 DIAGNOSIS — IMO0001 Reserved for inherently not codable concepts without codable children: Secondary | ICD-10-CM

## 2014-07-09 DIAGNOSIS — Z794 Long term (current) use of insulin: Secondary | ICD-10-CM

## 2014-07-09 DIAGNOSIS — R0602 Shortness of breath: Secondary | ICD-10-CM

## 2014-07-09 DIAGNOSIS — Z9114 Patient's other noncompliance with medication regimen: Secondary | ICD-10-CM

## 2014-07-09 DIAGNOSIS — Z8249 Family history of ischemic heart disease and other diseases of the circulatory system: Secondary | ICD-10-CM

## 2014-07-09 DIAGNOSIS — R5381 Other malaise: Secondary | ICD-10-CM

## 2014-07-09 DIAGNOSIS — E119 Type 2 diabetes mellitus without complications: Secondary | ICD-10-CM

## 2014-07-09 DIAGNOSIS — G4730 Sleep apnea, unspecified: Secondary | ICD-10-CM

## 2014-07-09 DIAGNOSIS — E785 Hyperlipidemia, unspecified: Secondary | ICD-10-CM

## 2014-07-09 DIAGNOSIS — Z91199 Patient's noncompliance with other medical treatment and regimen due to unspecified reason: Secondary | ICD-10-CM

## 2014-07-09 DIAGNOSIS — R5383 Other fatigue: Secondary | ICD-10-CM

## 2014-07-09 DIAGNOSIS — I10 Essential (primary) hypertension: Secondary | ICD-10-CM

## 2014-07-09 MED ORDER — METOPROLOL SUCCINATE ER 25 MG PO TB24: ORAL_TABLET | ORAL | Status: DC

## 2014-07-09 NOTE — Patient Instructions (Addendum)
Your physician recommends that you schedule a follow-up appointment in: 4-6 weeks   Your physician has recommended you make the following change in your medication:   DECREASE Toprol to 25 mg daily     Your physician has requested that you have an echocardiogram. Echocardiography is a painless test that uses sound waves to create images of your heart. It provides your doctor with information about the size and shape of your heart and how well your heart's chambers and valves are working. This procedure takes approximately one hour. There are no restrictions for this procedure.    Your physician has requested that you have a lexiscan myoview. For further information please visit [email or web address]. Please follow instruction sheet, as given.PLEASE HOLD TOPROL AND TAZTIA THE MORNING OF YOUR TEST        Thank you for choosing [facility] !

## 2014-07-09 NOTE — Progress Notes (Signed)
[name], female   DOB: 13-Feb-1961, 53 y.o.   MRN: [id number]      SUBJECTIVE: Pt is a 53 yr old woman with a PMH significant for type II diabetes mellitus, essential HTN, hyperlipidemia, sleep apnea, family h/o CAD, morbid obesity, and palpitations. She was most recently evaluated by Dr. Lattie Haw in 09/2011. She has been feeling fatigued for the past three months. She began using CPAP in the last several months but has only been able to use it 2 hours per night. She denies chest pain. She occasionally has palpitations, perhaps once a week. She has been more short of breath with exertion over the past month. She said she didn't used to get short of breath or fatigued in the past and this worries her, as her father had an MI in his 75's and had CHF, mother had CHF, two brothers had MI's in their 52's, and one sister died of an MI at 60. She denies smoking and alcohol use. She denies orthopnea and PND. She has had more swelling in her thighs and legs and takes Lasix. She denies asthma and a history of allergies. She had a cough for one month this past winter.  She does not exercise as she says she is exhausted after working (in [place] at [employer]), and has been working extra shifts.   Review of Systems: As per "subjective", otherwise negative.  No Known Allergies  Current Outpatient Prescriptions  Medication Sig Dispense Refill  . aspirin 81 MG tablet Take 81 mg by mouth daily.        . Blood Glucose Monitoring Suppl (ONETOUCH VERIO IQ SYSTEM) W/DEVICE KIT 1 kit by Does not apply route once.  1 kit  0  . CRESTOR 10 MG tablet TAKE 1 TABLET BY MOUTH AT BEDTIME  90 tablet  3  . exenatide (BYETTA) 10 MCG/0.04ML SOPN injection Inject 0.04 mLs (10 mcg total) into the skin 2 (two) times daily with a meal.  3 pen  3  . fexofenadine (ALLEGRA) 180 MG tablet Take 180 mg by mouth daily.        . fluticasone (FLONASE) 50 MCG/ACT nasal spray Place 2 sprays into both nostrils daily.  48 g  2   . furosemide (LASIX) 20 MG tablet TAKE 1 TABLET BY MOUTH 2 TIMES DAILY.  180 tablet  1  . glucose blood (ONETOUCH VERIO) test strip USE WITH VERIO IQ METER 3 TIMES DAILY TO TEST BLOOD GLUCOSE  100 each  5  . ibuprofen (ADVIL,MOTRIN) 800 MG tablet TAKE 1 TABLET BY MOUTH 3 TIMES A DAY AS NEEDED ONLY (60 TO LAST 90 DAYS)  60 tablet  2  . insulin aspart (NOVOLOG FLEXPEN) 100 UNIT/ML FlexPen 12 units in the morning, 10 units at lunchtime , and 12 units at bedtime  15 mL  11  . Lancet Device MISC Use with verio IQ meter three times daily to test blood sugar   100 each  PRN  . LANTUS SOLOSTAR 100 UNIT/ML Solostar Pen INJECT 60 UNITS UNDER THE SKIN DAILY  60 mL  12  . losartan (COZAAR) 100 MG tablet TAKE 1 TABLET BY MOUTH DAILY.  90 tablet  1  . metFORMIN (GLUCOPHAGE) 1000 MG tablet TAKE 1 TABLET BY MOUTH 2 TIMES DAILY WITH A MEAL  180 tablet  1  . metoprolol succinate (TOPROL-XL) 100 MG 24 hr tablet TAKE 1/2 TABLET BY MOUTH DAILY  30 tablet  3  . montelukast (SINGULAIR) 10 MG tablet  Take 1 tablet (10 mg total) by mouth at bedtime.  30 tablet  0  . pantoprazole (PROTONIX) 20 MG tablet TAKE 1 TABLET BY MOUTH EVERY DAY  90 tablet  1  . potassium chloride (K-DUR,KLOR-CON) 10 MEQ tablet TAKE 2 TABLETS BY MOUTH ONCE DAILY  180 tablet  PRN  . pyridoxine (B-6) 100 MG tablet Take 100 mg by mouth daily.      . rosuvastatin (CRESTOR) 20 MG tablet Take 20 mg by mouth daily.      Marland Kitchen TAZTIA XT 360 MG 24 hr capsule TAKE 1 CAPSULE BY MOUTH ONCE DAILY  90 capsule  3  . temazepam (RESTORIL) 15 MG capsule Take 1 capsule (15 mg total) by mouth at bedtime as needed for sleep.  30 capsule  3   No current facility-administered medications for this visit.    Past Medical History  Diagnosis Date  . Insulin dependent diabetes mellitus 1996  . Hypertension 2008    Normal CBC and CMet in 2012; negative stress nuclear in 2006- patient asymptomatic  . Obesity   . Hyperlipidemia 2008    Lipid profile in 04/2011:136, 53, 43  .  Multiple allergies     perennial  . Fibroids     Uterine  . Sleep apnea     Past Surgical History  Procedure Laterality Date  . Refractive surgery  2011    Bilateral, two seperate occasions first in 2006  . Retinal detachment surgery  05/2005  . Dental surgery      History   Social History  . Marital Status: Married    Spouse Name: N/A    Number of Children: N/A  . Years of Education: N/A   Occupational History  . Employed [employer] History Main Topics  . Smoking status: Never Smoker   . Smokeless tobacco: Not on file  . Alcohol Use: No  . Drug Use: No  . Sexual Activity: Yes    Birth Control/ Protection: None   Other Topics Concern  . Not on file   Social History Narrative   Married with 2 children     BP 122/58  Pulse 82  Wt 301 lbs  PHYSICAL EXAM General: NAD, morbidly obese HEENT: Normal. Neck: No JVD, no thyromegaly. Lungs: Clear to auscultation bilaterally with normal respiratory effort. CV: Nondisplaced PMI.  Regular rate and rhythm, normal S1/S2, no S3/S4, no murmur. Trace pretibial and periankle edema.  No carotid bruit.  Normal pedal pulses.  Abdomen: Soft, nontender, obese.  Neurologic: Alert and oriented x 3.  Psych: Normal affect. Skin: Normal. Musculoskeletal: Normal range of motion, no gross deformities. Extremities: No clubbing or cyanosis.   ECG: Most recent ECG reviewed.      ASSESSMENT AND PLAN: 1. Palpitations: Given her fatigue, I will reduce Toprol-XL to 25 mg daily. Continue long-acting diltiazem 360 mg daily.  2. Essential HTN: Controlled on present therapy.  3. Hyperlipidemia: On Crestor 10 mg daily. TC 145, TG 55, HDL 44, LDL 90 on 05/30/2014.  4. Shortness of breath and fatigue: While her fatigue may be due to physical deconditioning and incompletely treated sleep apnea, she has numerous risk factors for CAD. I will obtain an echocardiogram with contrast and Lexiscan Cardiolite to evaluate for occult ischemic  heart disease.  5. IDDM: HbA1C 7.8% on 05/30/14. Physical activity was strongly emphasized.  Dispo: f/u 4-6 weeks.  Time spent: 40 minutes, of which >50% was spent counseling on cardiovascular risk factor modification.  Kate Sable, M.D.,  F.A.C.C.

## 2014-07-10 ENCOUNTER — Encounter (HOSPITAL_COMMUNITY)
Admission: RE | Admit: 2014-07-10 | Discharge: 2014-07-10 | Disposition: A | Discharge status: Home / Self Care | Payer: 59 | Source: Ambulatory Visit | Attending: Cardiovascular Disease | Admitting: Cardiovascular Disease

## 2014-07-10 ENCOUNTER — Ambulatory Visit (HOSPITAL_COMMUNITY)
Admission: RE | Admit: 2014-07-10 | Discharge: 2014-07-10 | Disposition: A | Discharge status: Home / Self Care | Payer: 59 | Source: Ambulatory Visit | Attending: Cardiovascular Disease | Admitting: Cardiovascular Disease

## 2014-07-10 ENCOUNTER — Encounter (HOSPITAL_COMMUNITY): Payer: Self-pay

## 2014-07-10 DIAGNOSIS — R0609 Other forms of dyspnea: Secondary | ICD-10-CM | POA: Insufficient documentation

## 2014-07-10 DIAGNOSIS — R002 Palpitations: Secondary | ICD-10-CM | POA: Insufficient documentation

## 2014-07-10 DIAGNOSIS — E119 Type 2 diabetes mellitus without complications: Secondary | ICD-10-CM | POA: Insufficient documentation

## 2014-07-10 DIAGNOSIS — Z8249 Family history of ischemic heart disease and other diseases of the circulatory system: Secondary | ICD-10-CM | POA: Insufficient documentation

## 2014-07-10 DIAGNOSIS — I10 Essential (primary) hypertension: Secondary | ICD-10-CM | POA: Insufficient documentation

## 2014-07-10 DIAGNOSIS — R5383 Other fatigue: Secondary | ICD-10-CM

## 2014-07-10 DIAGNOSIS — I369 Nonrheumatic tricuspid valve disorder, unspecified: Secondary | ICD-10-CM

## 2014-07-10 DIAGNOSIS — R0989 Other specified symptoms and signs involving the circulatory and respiratory systems: Secondary | ICD-10-CM | POA: Insufficient documentation

## 2014-07-10 DIAGNOSIS — E785 Hyperlipidemia, unspecified: Secondary | ICD-10-CM | POA: Diagnosis not present

## 2014-07-10 DIAGNOSIS — R5381 Other malaise: Secondary | ICD-10-CM | POA: Insufficient documentation

## 2014-07-10 DIAGNOSIS — Z6841 Body Mass Index (BMI) 40.0 and over, adult: Secondary | ICD-10-CM | POA: Diagnosis not present

## 2014-07-10 DIAGNOSIS — R0602 Shortness of breath: Secondary | ICD-10-CM

## 2014-07-10 MED ORDER — SODIUM CHLORIDE 0.9 % IJ SOLN
INTRAMUSCULAR | Status: AC
  Administered 2014-07-10: 10 mL via INTRAVENOUS
  Filled 2014-07-10: qty 10

## 2014-07-10 MED ORDER — TECHNETIUM TC 99M SESTAMIBI - CARDIOLITE
10.0000 | Freq: Once | INTRAVENOUS | Status: AC | PRN
  Administered 2014-07-10: 08:00:00 10 via INTRAVENOUS

## 2014-07-10 MED ORDER — REGADENOSON 0.4 MG/5ML IV SOLN
INTRAVENOUS | Status: AC
  Administered 2014-07-10: 0.4 mg via INTRAVENOUS
  Filled 2014-07-10: qty 5

## 2014-07-10 MED ORDER — REGADENOSON 0.4 MG/5ML IV SOLN
0.4000 mg | Freq: Once | INTRAVENOUS | Status: AC | PRN
  Administered 2014-07-10: 0.4 mg via INTRAVENOUS

## 2014-07-10 MED ORDER — SODIUM CHLORIDE 0.9 % IJ SOLN
10.0000 mL | INTRAMUSCULAR | Status: DC | PRN
  Administered 2014-07-10: 10 mL via INTRAVENOUS

## 2014-07-10 MED ORDER — TECHNETIUM TC 99M SESTAMIBI GENERIC - CARDIOLITE
30.0000 | Freq: Once | INTRAVENOUS | Status: AC | PRN
  Administered 2014-07-10: 30 via INTRAVENOUS

## 2014-07-10 NOTE — Progress Notes (Signed)
Stress Lab Nurses Notes - [place] 07/10/2014 Reason for doing test: Dyspnea, & Palpitations Type of test:  Wille Glaser Nurse performing test: Gerrit Halls, RN Nuclear Medicine Tech: Melburn Hake Echo Tech: Not Applicable MD performing test: Koneswaran/K.Lawrence NP Family MA:[id number]  Test explained and consent signed: Yes.   IV started: Saline lock flushed, No redness or edema and Saline lock started in radiology Symptoms: Nausea Treatment/Intervention: None Reason test stopped: protocol completed After recovery IV was: Discontinued via X-ray tech and No redness or edema Patient to return to Nuc. Med at : 10:15 Patient discharged: Home Patient's Condition upon discharge was: stable Comments: During test BP 160/50 & HR 107.  Recovery BP 164/52 & HR 90.  Symptoms resolved in recovery. Geanie Cooley T

## 2014-07-10 NOTE — Progress Notes (Signed)
  Echocardiogram 2D Echocardiogram has been performed.  [place], [place] 07/10/2014, 12:33 PM

## 2014-07-22 ENCOUNTER — Encounter: Payer: Self-pay | Admitting: Family Medicine

## 2014-07-22 NOTE — Progress Notes (Signed)
[name], female   DOB: 1960/12/10, 53 y.o.   MRN: [id number] Reviewed: Agree with the documentation and management of our [facility] pharmacologist.

## 2014-07-31 ENCOUNTER — Ambulatory Visit: Payer: 59 | Admitting: Cardiovascular Disease

## 2014-08-06 ENCOUNTER — Other Ambulatory Visit: Payer: Self-pay | Admitting: Family Medicine

## 2014-08-18 ENCOUNTER — Other Ambulatory Visit: Payer: Self-pay | Admitting: Family Medicine

## 2014-08-28 ENCOUNTER — Other Ambulatory Visit: Payer: Self-pay

## 2014-08-31 ENCOUNTER — Ambulatory Visit (INDEPENDENT_AMBULATORY_CARE_PROVIDER_SITE_OTHER): Payer: 59 | Admitting: Cardiovascular Disease

## 2014-08-31 ENCOUNTER — Encounter: Payer: Self-pay | Admitting: Cardiovascular Disease

## 2014-08-31 VITALS — BP 140/68 | HR 86 | Ht 65.0 in | Wt 306.0 lb

## 2014-08-31 DIAGNOSIS — R5383 Other fatigue: Secondary | ICD-10-CM

## 2014-08-31 DIAGNOSIS — Z794 Long term (current) use of insulin: Secondary | ICD-10-CM

## 2014-08-31 DIAGNOSIS — G4730 Sleep apnea, unspecified: Secondary | ICD-10-CM

## 2014-08-31 DIAGNOSIS — E119 Type 2 diabetes mellitus without complications: Secondary | ICD-10-CM

## 2014-08-31 DIAGNOSIS — R002 Palpitations: Secondary | ICD-10-CM

## 2014-08-31 DIAGNOSIS — IMO0001 Reserved for inherently not codable concepts without codable children: Secondary | ICD-10-CM

## 2014-08-31 DIAGNOSIS — Z136 Encounter for screening for cardiovascular disorders: Secondary | ICD-10-CM

## 2014-08-31 DIAGNOSIS — Z72821 Inadequate sleep hygiene: Secondary | ICD-10-CM

## 2014-08-31 DIAGNOSIS — I10 Essential (primary) hypertension: Secondary | ICD-10-CM

## 2014-08-31 DIAGNOSIS — Z8249 Family history of ischemic heart disease and other diseases of the circulatory system: Secondary | ICD-10-CM

## 2014-08-31 DIAGNOSIS — R0602 Shortness of breath: Secondary | ICD-10-CM

## 2014-08-31 NOTE — Patient Instructions (Signed)
Your physician wants you to follow-up in: 1 year You will receive a reminder letter in the mail two months in advance. If you don't receive a letter, please call our office to schedule the follow-up appointment.    Your physician recommends that you continue on your current medications as directed. Please refer to the Current Medication list given to you today.     Thank you for choosing [facility] !  

## 2014-08-31 NOTE — Progress Notes (Signed)
[name], female   DOB: July 23, 1961, 53 y.o.   MRN: [id number]      SUBJECTIVE: The patient presents for followup after undergoing cardiovascular testing for shortness of breath and fatigue. In summary, she is a 53 yr old woman with a PMH significant for type II diabetes mellitus, essential HTN, hyperlipidemia, sleep apnea, family h/o CAD, morbid obesity, and palpitations.  She works in the [employer]. Lexiscan Cardiolite stress test was a low-risk study with no evidence of ischemia or infarction. Echocardiogram demonstrated normal left ventricular systolic function, EF 32-67%, mild LVH, grade 1 diastolic dysfunction with high filling pressures, and mild mitral and tricuspid regurgitation. She still feels fatigued but has only been using a new CPAP device since last week. She has not had a repeat CPAP titration study in several months. She continues to work extra shifts and sleeps 5 hours per night or less. She's also had difficulties with blood sugar fluctuations. She stopped metoprolol as it led to fatigue.  Review of Systems: As per "subjective", otherwise negative.  No Known Allergies  Current Outpatient Prescriptions  Medication Sig Dispense Refill  . aspirin 81 MG tablet Take 81 mg by mouth daily.        . benzonatate (TESSALON) 100 MG capsule Take 100 mg by mouth 2 (two) times daily as needed for cough.      . Blood Glucose Monitoring Suppl (ONETOUCH VERIO IQ SYSTEM) W/DEVICE KIT 1 kit by Does not apply route once.  1 kit  0  . CRESTOR 10 MG tablet TAKE 1 TABLET BY MOUTH AT BEDTIME  90 tablet  3  . exenatide (BYETTA) 10 MCG/0.04ML SOPN injection Inject 0.04 mLs (10 mcg total) into the skin 2 (two) times daily with a meal.  3 pen  3  . fexofenadine (ALLEGRA) 180 MG tablet Take 180 mg by mouth daily.        . fluticasone (FLONASE) 50 MCG/ACT nasal spray Place 2 sprays into both nostrils daily.  48 g  2  . furosemide (LASIX) 20 MG tablet TAKE 1 TABLET BY MOUTH 2 TIMES  DAILY.  180 tablet  PRN  . glucose blood (ONETOUCH VERIO) test strip USE WITH VERIO IQ METER 3 TIMES DAILY TO TEST BLOOD GLUCOSE  100 each  5  . ibuprofen (ADVIL,MOTRIN) 800 MG tablet TAKE 1 TABLET BY MOUTH 3 TIMES A DAY AS NEEDED ONLY (60 TO LAST 90 DAYS)  60 tablet  2  . insulin aspart (NOVOLOG FLEXPEN) 100 UNIT/ML FlexPen 12 units in the morning, 10 units at lunchtime , and 12 units at bedtime  15 mL  11  . Lancet Device MISC Use with verio IQ meter three times daily to test blood sugar   100 each  PRN  . LANTUS SOLOSTAR 100 UNIT/ML Solostar Pen INJECT 60 UNITS UNDER THE SKIN DAILY  60 mL  12  . losartan (COZAAR) 100 MG tablet TAKE 1 TABLET BY MOUTH DAILY.  90 tablet  1  . metFORMIN (GLUCOPHAGE) 1000 MG tablet TAKE 1 TABLET BY MOUTH 2 TIMES DAILY WITH A MEAL  180 tablet  1  . metoprolol succinate (TOPROL-XL) 25 MG 24 hr tablet Take 1 tablet daily  90 tablet  3  . montelukast (SINGULAIR) 10 MG tablet Take 1 tablet (10 mg total) by mouth at bedtime.  30 tablet  0  . pantoprazole (PROTONIX) 20 MG tablet TAKE 1 TABLET BY MOUTH EVERY DAY  90 tablet  1  . potassium chloride (K-DUR,KLOR-CON)  10 MEQ tablet 1 tab twice daily      . pyridoxine (B-6) 100 MG tablet Take 100 mg by mouth daily.      . rosuvastatin (CRESTOR) 20 MG tablet Take 20 mg by mouth daily.      Marland Kitchen TAZTIA XT 360 MG 24 hr capsule TAKE 1 CAPSULE BY MOUTH ONCE DAILY  90 capsule  3  . temazepam (RESTORIL) 15 MG capsule Take 1 capsule (15 mg total) by mouth at bedtime as needed for sleep.  30 capsule  3   No current facility-administered medications for this visit.    Past Medical History  Diagnosis Date  . Insulin dependent diabetes mellitus 1996  . Hypertension 2008    Normal CBC and CMet in 2012; negative stress nuclear in 2006- patient asymptomatic  . Obesity   . Hyperlipidemia 2008    Lipid profile in 04/2011:136, 53, 43  . Multiple allergies     perennial  . Fibroids     Uterine  . Sleep apnea     Past Surgical History    Procedure Laterality Date  . Refractive surgery  2011    Bilateral, two seperate occasions first in 2006  . Retinal detachment surgery  05/2005  . Dental surgery      History   Social History  . Marital Status: Married    Spouse Name: N/A    Number of Children: N/A  . Years of Education: N/A   Occupational History  . Employed [employer] History Main Topics  . Smoking status: Never Smoker   . Smokeless tobacco: Never Used  . Alcohol Use: No  . Drug Use: No  . Sexual Activity: Yes    Birth Control/ Protection: None   Other Topics Concern  . Not on file   Social History Narrative   Married with 2 children     Filed Vitals:   08/31/14 1632  Height: 5' 5"  (1.651 m)  Weight: 306 lb (138.801 kg)   BP 140/68  Pulse 86   PHYSICAL EXAM General: NAD, morbidly obese  HEENT: Normal.  Neck: No JVD, no thyromegaly.  Lungs: Clear to auscultation bilaterally with normal respiratory effort.  CV: Nondisplaced PMI. Regular rate and rhythm, normal S1/S2, no S3/S4, no murmur. Trace pretibial and periankle edema. No carotid bruit. Normal pedal pulses.  Abdomen: Soft, nontender, obese.  Neurologic: Alert and oriented x 3.  Psych: Normal affect. Skin: Normal. Musculoskeletal: Normal range of motion, no gross deformities. Extremities: No clubbing or cyanosis.   ECG: Most recent ECG reviewed.   ASSESSMENT AND PLAN: 1. Palpitations: Continue long-acting diltiazem 360 mg daily.  2. Essential HTN: Borderline elevation on present therapy. Will monitor. No changes to therapy today. 3. Hyperlipidemia: On Crestor 10 mg daily. TC 145, TG 55, HDL 44, LDL 90 on 05/30/2014.  4. Shortness of breath and fatigue: Likely multifactorial and due to physical deconditioning and incompletely treated sleep apnea. 5. IDDM: HbA1C 7.8% on 05/30/14. Physical activity was strongly emphasized.  6. Sleep apnea: I gave her extensive counseling on the appropriate use of CPAP, as well as proper sleep  hygiene.  Dispo: f/u 1 year.  Kate Sable, M.D., F.A.C.C.

## 2014-09-01 ENCOUNTER — Telehealth: Payer: Self-pay | Admitting: Family Medicine

## 2014-09-01 DIAGNOSIS — E785 Hyperlipidemia, unspecified: Secondary | ICD-10-CM

## 2014-09-01 DIAGNOSIS — I10 Essential (primary) hypertension: Secondary | ICD-10-CM

## 2014-09-01 DIAGNOSIS — E1129 Type 2 diabetes mellitus with other diabetic kidney complication: Secondary | ICD-10-CM

## 2014-09-01 NOTE — Telephone Encounter (Signed)
Pls contact pt and let her know that I see where she recently saw the cardiologist. She has no appt scheduled here, needs to be seen first or 2nd week in Novemebr, needs hBA1C, chem 7 and EGFR and CBC non fasting drawn first week in Novemebr, PRIOR to visit (3 to 5 days, but not before 10/29)pls order labs 

## 2014-09-01 NOTE — Telephone Encounter (Signed)
Message left and lab order mailed

## 2014-09-01 NOTE — Addendum Note (Signed)
Addended by: Eual Fines on: 09/01/2014 08:16 AM   Modules accepted: Orders

## 2014-09-16 ENCOUNTER — Other Ambulatory Visit: Payer: Self-pay | Admitting: Family Medicine

## 2014-09-17 ENCOUNTER — Encounter: Payer: Self-pay | Admitting: Family Medicine

## 2014-09-17 LAB — CBC WITH DIFFERENTIAL/PLATELET
Basophils Absolute: 0.1 10*3/uL (ref 0.0–0.1)
Basophils Relative: 1 % (ref 0–1)
Eosinophils Absolute: 0.4 10*3/uL (ref 0.0–0.7)
Eosinophils Relative: 4 % (ref 0–5)
HCT: 35.2 % — ABNORMAL LOW (ref 36.0–46.0)
Hemoglobin: 11.7 g/dL — ABNORMAL LOW (ref 12.0–15.0)
Lymphocytes Relative: 33 % (ref 12–46)
Lymphs Abs: 3.3 10*3/uL (ref 0.7–4.0)
MCH: 28.6 pg (ref 26.0–34.0)
MCHC: 33.2 g/dL (ref 30.0–36.0)
MCV: 86.1 fL (ref 78.0–100.0)
Monocytes Absolute: 0.7 10*3/uL (ref 0.1–1.0)
Monocytes Relative: 7 % (ref 3–12)
Neutro Abs: 5.4 10*3/uL (ref 1.7–7.7)
Neutrophils Relative %: 55 % (ref 43–77)
Platelets: 317 10*3/uL (ref 150–400)
RBC: 4.09 MIL/uL (ref 3.87–5.11)
RDW: 14.3 % (ref 11.5–15.5)
WBC: 9.9 10*3/uL (ref 4.0–10.5)

## 2014-09-17 LAB — BASIC METABOLIC PANEL WITH GFR
BUN: 12 mg/dL (ref 6–23)
CO2: 29 mEq/L (ref 19–32)
Calcium: 9.4 mg/dL (ref 8.4–10.5)
Chloride: 106 mEq/L (ref 96–112)
Creat: 0.97 mg/dL (ref 0.50–1.10)
GFR, Est African American: 77 mL/min
GFR, Est Non African American: 66 mL/min
Glucose, Bld: 66 mg/dL — ABNORMAL LOW (ref 70–99)
Potassium: 4.6 mEq/L (ref 3.5–5.3)
Sodium: 141 mEq/L (ref 135–145)

## 2014-09-17 LAB — HEMOGLOBIN A1C
Hgb A1c MFr Bld: 7.6 % — ABNORMAL HIGH (ref <5.7)
Mean Plasma Glucose: 171 mg/dL — ABNORMAL HIGH (ref <117)

## 2014-09-18 LAB — FERRITIN: Ferritin: 49 ng/mL (ref 10–291)

## 2014-09-18 LAB — IRON: Iron: 39 ug/dL — ABNORMAL LOW (ref 42–145)

## 2014-09-24 ENCOUNTER — Encounter: Payer: Self-pay | Admitting: Family Medicine

## 2014-09-24 ENCOUNTER — Ambulatory Visit (INDEPENDENT_AMBULATORY_CARE_PROVIDER_SITE_OTHER): Payer: 59 | Admitting: Family Medicine

## 2014-09-24 VITALS — BP 134/64 | HR 80 | Resp 16 | Ht 65.0 in | Wt 309.1 lb

## 2014-09-24 DIAGNOSIS — I10 Essential (primary) hypertension: Secondary | ICD-10-CM

## 2014-09-24 DIAGNOSIS — K219 Gastro-esophageal reflux disease without esophagitis: Secondary | ICD-10-CM

## 2014-09-24 DIAGNOSIS — E785 Hyperlipidemia, unspecified: Secondary | ICD-10-CM

## 2014-09-24 DIAGNOSIS — R21 Rash and other nonspecific skin eruption: Secondary | ICD-10-CM | POA: Insufficient documentation

## 2014-09-24 DIAGNOSIS — E1129 Type 2 diabetes mellitus with other diabetic kidney complication: Secondary | ICD-10-CM

## 2014-09-24 MED ORDER — METFORMIN HCL 1000 MG PO TABS: ORAL_TABLET | ORAL | Status: DC

## 2014-09-24 MED ORDER — PANTOPRAZOLE SODIUM 20 MG PO TBEC: DELAYED_RELEASE_TABLET | ORAL | Status: DC

## 2014-09-24 MED ORDER — BETAMETHASONE VALERATE 0.1 % EX OINT: 1.0000 "application " | TOPICAL_OINTMENT | Freq: Two times a day (BID) | CUTANEOUS | Status: DC

## 2014-09-24 NOTE — Progress Notes (Signed)
   Subjective:    [name], female    DOB: [birth year], 53 y.o.   MRN: [id number]  HPI The PT is here for follow up and re-evaluation of chronic medical conditions, medication management and review of any available recent lab and radiology data.  Preventive health is updated, specifically  Cancer screening and Immunization.  Still needs colonoscopy and eye exam Questions or concerns regarding consultations or procedures which the PT has had in the interim are  Addressed.Recently evaluated by cardiology for chest pain felt to be GI related The PT denies any adverse reactions to current medications since the last visit.  C/o enlarging right hand lesion in past 2 months Denies polyuria, polydipsia, blurred vision , or hypoglycemic episodes. C/o weight gain    Review of Systems See HPI Denies recent fever or chills. Denies sinus pressure, nasal congestion, ear pain or sore throat. Denies chest congestion, productive cough or wheezing. Denies chest pains, palpitations and leg swelling Denies abdominal pain, nausea, vomiting,diarrhea or constipation.   Denies dysuria, frequency, hesitancy or incontinence. Denies joint pain, swelling and limitation in mobility. Denies headaches, seizures, numbness, or tingling. Denies depression, anxiety or insomnia.      Objective:   Physical Exam  BP 134/64 mmHg  Pulse 80  Resp 16  Ht 5\' 5"  (1.651 m)  Wt 309 lb 1.9 oz (140.216 kg)  BMI 51.44 kg/m2  SpO2 98% Patient alert and oriented and in no cardiopulmonary distress.  HEENT: No facial asymmetry, EOMI,   oropharynx pink and moist.  Neck supple no JVD, no mass.  Chest: Clear to auscultation bilaterally.  CVS: S1, S2 no murmurs, no S3.Regular rate.  ABD: Soft non tender.   Ext: No edema  MS: Adequate ROM spine, shoulders, hips and knees.  Skin: Intact, hyperpigmented macula papular rash on dorsum of right hand diameter approx 3.5 cmPsych: Good eye contact, normal  affect. Memory intact not anxious or depressed appearing.  CNS: CN 2-12 intact, power,  normal throughout.no focal deficits noted. Essential hypertension Controlled, no change in medication DASH diet and commitment to daily physical activity for a minimum of 30 minutes discussed and encouraged, as a part of hypertension management. The importance of attaining a healthy weight is also discussed.   GERD (gastroesophageal reflux disease) Controlled, no change in medication   Type II diabetes mellitus with renal manifestations Controlled, increase  in medication Patient advised to reduce carb and sweets, commit to regular physical activity, take meds as prescribed, test blood as directed, and attempt to lose weight, to improve blood sugar control. Updated lab needed at/ before next visit.    Rash and nonspecific skin eruption Hyperpigmented paular rash on dorsum of right hand increased in size x 2  Months, no bleeding or itch Topical steroid twice daily for 2 weeks, pt to call in for derm eval if not improved  Hyperlipidemia Hyperlipidemia:Low fat diet discussed and encouraged.  Controlled, no change in medication  Updated lab needed at/ before next visit.    Morbid obesity Deteriorated. Patient re-educated about  the importance of commitment to a  minimum of 150 minutes of exercise per week. The importance of healthy food choices with portion control discussed. Encouraged to start a food diary, count calories and to consider  joining a support group. Sample diet sheets offered. Goals set by the patient for the next several months.            Assessment & Plan:

## 2014-09-24 NOTE — Patient Instructions (Addendum)
F/u in March 10 or after, call if  You need me before  Congrats on improved blood sugar  Increase lantus to 65 units daily, PLEASE eat consistently same time, same amount of carbs, fasting blood sugar should be 80 to 130 and 2 hrs after a meal or bedtime 140 to 180  Pls commit to daily exercise for 30 minutes  Medication is sent for rash on right hand since 05/2014 If not improving in 2 weeks pls call for referral to dermatology as we discussed  Fasting lipid, cmp and EGFR and HBA1C and microalb  March 7 or after or after  Please schedule colonoscopy and eye exam

## 2014-09-24 NOTE — Progress Notes (Signed)
Area on right hand measures 2 in x .9 in

## 2014-09-28 ENCOUNTER — Ambulatory Visit: Payer: 59 | Admitting: Family Medicine

## 2014-09-28 NOTE — Assessment & Plan Note (Signed)
Controlled, increase  in medication Patient advised to reduce carb and sweets, commit to regular physical activity, take meds as prescribed, test blood as directed, and attempt to lose weight, to improve blood sugar control. Updated lab needed at/ before next visit.

## 2014-09-28 NOTE — Assessment & Plan Note (Signed)
Controlled, no change in medication  

## 2014-09-28 NOTE — Assessment & Plan Note (Signed)
Hyperlipidemia:Low fat diet discussed and encouraged.  Controlled, no change in medication Updated lab needed at/ before next visit.  

## 2014-09-28 NOTE — Assessment & Plan Note (Signed)
Deteriorated. Patient re-educated about  the importance of commitment to a  minimum of 150 minutes of exercise per week. The importance of healthy food choices with portion control discussed. Encouraged to start a food diary, count calories and to consider  joining a support group. Sample diet sheets offered. Goals set by the patient for the next several months.    

## 2014-09-28 NOTE — Assessment & Plan Note (Signed)
Controlled, no change in medication DASH diet and commitment to daily physical activity for a minimum of 30 minutes discussed and encouraged, as a part of hypertension management. The importance of attaining a healthy weight is also discussed.  

## 2014-09-28 NOTE — Assessment & Plan Note (Signed)
Hyperpigmented paular rash on dorsum of right hand increased in size x 2  Months, no bleeding or itch Topical steroid twice daily for 2 weeks, pt to call in for derm eval if not improved

## 2014-10-12 ENCOUNTER — Ambulatory Visit (INDEPENDENT_AMBULATORY_CARE_PROVIDER_SITE_OTHER): Payer: 59 | Admitting: Podiatry

## 2014-10-12 ENCOUNTER — Encounter: Payer: Self-pay | Admitting: Podiatry

## 2014-10-12 VITALS — BP 128/76 | HR 66 | Resp 16

## 2014-10-12 DIAGNOSIS — E1151 Type 2 diabetes mellitus with diabetic peripheral angiopathy without gangrene: Secondary | ICD-10-CM

## 2014-10-12 DIAGNOSIS — B351 Tinea unguium: Secondary | ICD-10-CM

## 2014-10-12 DIAGNOSIS — M79673 Pain in unspecified foot: Secondary | ICD-10-CM

## 2014-10-12 DIAGNOSIS — Q828 Other specified congenital malformations of skin: Secondary | ICD-10-CM

## 2014-10-12 NOTE — Progress Notes (Signed)
   Subjective:    [name], female    DOB: 1961/07/01, 53 y.o.   MRN: [id number]  HPI Comments: "I have knots on the bottoms"  Patient c/o tender callused area plantar feet bilateral for several months. Worse with standing. She is a diabetic. Her last A1C was 7.6. She has been using compound w treatment on the areas and filing down. She has other thickened areas as well. She would like her nails cut too.   Foot Pain Associated symptoms include a rash.      Review of Systems  Skin: Positive for rash.  All other systems reviewed and are negative.      Objective:   Physical Exam        Assessment & Plan:

## 2014-10-12 NOTE — Patient Instructions (Signed)
Diabetes and Foot Care Diabetes may cause you to have problems because of poor blood supply (circulation) to your feet and legs. This may cause the skin on your feet to become thinner, break easier, and heal more slowly. Your skin may become dry, and the skin may peel and crack. You may also have nerve damage in your legs and feet causing decreased feeling in them. You may not notice minor injuries to your feet that could lead to infections or more serious problems. Taking care of your feet is one of the most important things you can do for yourself.  HOME CARE INSTRUCTIONS  Wear shoes at all times, even in the house. Do not go barefoot. Bare feet are easily injured.  Check your feet daily for blisters, cuts, and redness. If you cannot see the bottom of your feet, use a mirror or ask someone for help.  Wash your feet with warm water (do not use hot water) and mild soap. Then pat your feet and the areas between your toes until they are completely dry. Do not soak your feet as this can dry your skin.  Apply a moisturizing lotion or petroleum jelly (that does not contain alcohol and is unscented) to the skin on your feet and to dry, brittle toenails. Do not apply lotion between your toes.  Trim your toenails straight across. Do not dig under them or around the cuticle. File the edges of your nails with an emery board or nail file.  Do not cut corns or calluses or try to remove them with medicine.  Wear clean socks or stockings every day. Make sure they are not too tight. Do not wear knee-high stockings since they may decrease blood flow to your legs.  Wear shoes that fit properly and have enough cushioning. To break in new shoes, wear them for just a few hours a day. This prevents you from injuring your feet. Always look in your shoes before you put them on to be sure there are no objects inside.  Do not cross your legs. This may decrease the blood flow to your feet.  If you find a minor scrape,  cut, or break in the skin on your feet, keep it and the skin around it clean and dry. These areas may be cleansed with mild soap and water. Do not cleanse the area with peroxide, alcohol, or iodine.  When you remove an adhesive bandage, be sure not to damage the skin around it.  If you have a wound, look at it several times a day to make sure it is healing.  Do not use heating pads or hot water bottles. They may burn your skin. If you have lost feeling in your feet or legs, you may not know it is happening until it is too late.  Make sure your health care provider performs a complete foot exam at least annually or more often if you have foot problems. Report any cuts, sores, or bruises to your health care provider immediately. SEEK MEDICAL CARE IF:   You have an injury that is not healing.  You have cuts or breaks in the skin.  You have an ingrown nail.  You notice redness on your legs or feet.  You feel burning or tingling in your legs or feet.  You have pain or cramps in your legs and feet.  Your legs or feet are numb.  Your feet always feel cold. SEEK IMMEDIATE MEDICAL CARE IF:   There is increasing redness,   swelling, or pain in or around a wound.  There is a red line that goes up your leg.  Pus is coming from a wound.  You develop a fever or as directed by your health care provider.  You notice a bad smell coming from an ulcer or wound. Document Released: 10/27/2000 Document Revised: 07/02/2013 Document Reviewed: 04/08/2013 ExitCare Patient Information 2015 [employer]. This information is not intended to replace advice given to you by your health care provider. Make sure you discuss any questions you have with your health care provider.  

## 2014-10-12 NOTE — Progress Notes (Signed)
Subjective:     [name], female   DOB: [birth year], 53 y.o.   MRN: [id number]  HPI patient presents stating she has painful calluses on both her feet and nailbeds that she cannot take care of herself. Patient is obese cannot reach her feet and has a long-term history with diabetes which is under reasonable control   Review of Systems  All other systems reviewed and are negative.      Objective:   Physical Exam  Constitutional: She is oriented to person, place, and time.  Cardiovascular: Intact distal pulses.   Musculoskeletal: Normal range of motion.  Neurological: She is oriented to person, place, and time.  Skin: Skin is warm and dry.  Nursing note and vitals reviewed.  neurovascular status found to be intact with both sharp dull and vibratory mildly diminished but intact and is noted to have significant reduced range of motion subtalar midtarsal joint and does have good digital perfusion and is well oriented 3. I noted the patient to have nail disease with thickness 1-5 of both feet and keratotic lesions on the plantar aspect of both feet that are lucent when evaluated. Patient has nail disease 1-5 both feet with thickness and incurvation of the borders     Assessment:     Long-term at risk diabetic with mycotic nail infection and lesions on both feet that are painful    Plan:     Debride painful nailbeds 1-5 both feet and lesions on both feet with no iatrogenic bleeding noted area gave instructions on physical therapy and what to watch for with her diabetes and plan on seen every 3 months and less she needs to be seen earlier

## 2014-10-26 ENCOUNTER — Other Ambulatory Visit: Payer: Self-pay | Admitting: Family Medicine

## 2014-11-09 ENCOUNTER — Other Ambulatory Visit: Payer: Self-pay | Admitting: Family Medicine

## 2014-11-09 DIAGNOSIS — Z1231 Encounter for screening mammogram for malignant neoplasm of breast: Secondary | ICD-10-CM

## 2014-11-11 ENCOUNTER — Other Ambulatory Visit: Payer: Self-pay | Admitting: Family Medicine

## 2014-11-16 ENCOUNTER — Ambulatory Visit (HOSPITAL_COMMUNITY)
Admission: RE | Admit: 2014-11-16 | Discharge: 2014-11-16 | Disposition: A | Discharge status: Home / Self Care | Payer: 59 | Source: Ambulatory Visit | Attending: Family Medicine | Admitting: Family Medicine

## 2014-11-16 DIAGNOSIS — Z1231 Encounter for screening mammogram for malignant neoplasm of breast: Secondary | ICD-10-CM | POA: Insufficient documentation

## 2014-11-24 ENCOUNTER — Telehealth: Payer: 59 | Admitting: Nurse Practitioner

## 2014-11-24 DIAGNOSIS — H5710 Ocular pain, unspecified eye: Secondary | ICD-10-CM

## 2014-11-24 NOTE — Progress Notes (Signed)
We are sorry that you are not feeling well.  Here is how we plan to help!  Based on what you are telling me, it looks like you will need a face to face visit for a potentially serious eye condition that may affect your vision long term.  If you are having a true medical emergency please call 911.  If you need an urgent face to face visit, [facility] has four urgent care centers for your convenience.  . [facility] a Provider at this Location  [address] [place], [place] [address] . 8 am to 8 pm Monday-Friday . 9 am to 7 pm Saturday-Sunday  . [facility] [place] a Provider at this Location  [address], [place], [place] [address] . 8 am to 8 pm Monday-Friday . 9 am to 6 pm Saturday . 11 am to 6 pm Sunday   . [facility] [place] Get Driving Directions  [address] [place], [place] [address] . 8 am to 8 pm Monday-Friday . 9 am to 4 pm Saturday-Sunday   . [facility] (a walk in primary care provider)  [facility] a Provider at this Location  [place], [place] [address] . 8 am to 8:30 pm Monday-Thursday . 8 am to 6 pm Friday . 8 am to 4 pm Saturday-Sunday  Your e-visit answers were reviewed by a board certified advanced clinical practitioner to complete your personal care plan.  Depending on the condition, your plan could have included both over the counter or prescription medications.  You will get an e-mail in the next two days asking about your experience.  I hope that your e-visit has been valuable and will speed your recovery . Thank you for choosing an e-visit.

## 2014-12-18 ENCOUNTER — Ambulatory Visit (INDEPENDENT_AMBULATORY_CARE_PROVIDER_SITE_OTHER): Payer: Self-pay | Admitting: Family Medicine

## 2014-12-18 ENCOUNTER — Other Ambulatory Visit: Payer: Self-pay | Admitting: Family Medicine

## 2014-12-18 VITALS — Wt 310.0 lb

## 2014-12-18 DIAGNOSIS — E1129 Type 2 diabetes mellitus with other diabetic kidney complication: Secondary | ICD-10-CM

## 2014-12-18 NOTE — Assessment & Plan Note (Signed)
Subjective:  Patient presents today for 3 month diabetes follow-up as part of the employer-sponsored Link to Wellness program. Current diabetes regimen includes Lantus 60 units SQ once daily, Novolog 12 units SQ with meals, Byetta 10 mcg SQ once daily, metformin 1000 mg BID. Patient also continues on daily ARB and statin. No major health changes at this time. Medication changes since her last LTW visit- metoprolol & fexofenadine were d/c; patient started taking loratadine 10 mg daily and ferrous sulfate 325 mg once daily.  Last visit with Dr. Moshe Cipro was in November. Next appointment is later this month. Her last A1C in November was 7.6%. Patient requested that we check her CBG in the visit. CBG was 138 mg/dL.  Patient states that things have been going OK. She admits to overeating over the holidays and gaining weight.     Disease Assessments:  Diabetes: Type of Diabetes: Type 2; Sees Diabetes provider 4 or more times per year; checks feet daily; uses glucometer; takes medications as prescribed; takes an aspirin a day; Current Diabetes related medical conditions are High blood pressure, High cholesterol; MD managing Diabetes Simpson; checks blood glucose once daily fasting; Highest CBG 200; Lowest CBG 60;   Other Diabetes History:  She states that her meter hasn't been working and hasn't checked for the past few days. Gave patient a new True Result Meter and showed her how to use it.  Previous to that she was checking twice daily- fasting and again at 10 PM. She states that sometimes in the morning she will be low- 70-80. At night she is in the 150s.  She states that she is going low twice a week. It depends on what she eats before she goes to bed.   ; hypoglycemia frequency twice a week.     Social History:  Denies alcohol use; Denies caffeine use; Social work consult was not done; No drug use; Patient can afford medications; Patient knows the purpose/use of medications; Pharmacy assist  consult was not needed; Pharmacist consult was not done; Exercise adherence Never; 0 minutes of exercise per week; Diet adherence 25-50% of the time.  Occupation: endoscopy tech at [employer]  Physical Activity- nothing outside of work.  Nutrition- she states that things weren't so good over the holidays. She states that in January she has been better.  B- oatmeal, banana; sometimes sausage biscuit L- usually something from home; usually meat and vegetables; D- Eats out a lot. Grilled chicken, burgers. She eats a lot of chicken and fish.  She admits to eating more concentrated sweets lately- at least once daily. She is also drinking juice daily.                   Vital Signs:  12/18/2014 4:39 PM (EST)Height 5 ft 4.5 in   Testing:  Blood Sugar Tests: Hemoglobin A1c: 7.6 via epic  resulted on 09/16/2014   Care Planning:  Learning Preference Assessment:  Learner: Patient  Readiness to Learn Barriers: None  Teaching Method: Explanation  Evaluation of Learning: Needs review and assistance  Readiness to Change:  How important is your health to you? 10  How confident are you in working to improve your health? 5  How ready are you to change to improve your health? 5  Total Score: 7  Care Plan:  12/18/2014 5:18 PM (EST) (2)  Problem: Physical Inactivity  Role: Clinical Pharmacist  Long Term Goal Start going to the [facility] and participating in a water aerobics class once a  week.  Date Started: 12/18/2014  12/18/2014 4:39 PM (EST) (1)  Problem: Improving control of Type II DM as evidenced by improved A1C of 7.6% on 09/16/14  Role: Care Management Coordinator  Long Term Goal : Ongoing improved control of Type II DM as evidenced by improved A1C (<7.6%) at next Link To Wellness visit  Interventions:  Reviewed the 8 core pathophysiologic deficits in Type II diabetes. Discussed physiology of diabetes as a chronic progressive disease with increased loss of beta cell function over  time and the problem of insulin resistance in the muscle and liver cells.  Discussed role of obesity on insulin resistance. Encouraged moderate weight reduction, 5-7% of BMI, as short term goal with long term goal of normal BMI to improve glucose levels by decreasing insulin resistance. encouraged Sydnie to refer to sample diet sheets provided by Dr. Moshe Cipro on 09/24/14  Reviewed patient medications. Discussed DM medications including the mechanism of action, common side effects, dosages and dosing schedule. Reinforced importance of taking medications as prescribed. Sent secure email to Caran clarifying Lantus dose and that Byetta is ordered for twice daily dosing  Discussed the need for the use of a combination of DM medications to correct the pathophysiologic core deficits and to prevent or slow beta cell failure  Discussed effects of physical activity on glucose levels and long-term glucose control by improving insulin sensitivity and assisting with weight management. Again encouraged her to join [facility] water exercise classes with goal of 150 minutes of exercise per week.  Reviewed target blood sugars of <130 for fasting and <180 after meals  Reinforced Dr. Griffin Dakin instructions to schedule her eye exam and colonoscopy  Reviewed upcoming appointments with patient's primary care MD on 12/31/14 and scheduled required program annual visit with pharmacist on 12/18/14 at 3:30 pm. Reinforced the importance of keeping the appointments. Encouraged patient to write questions/concerns in advance to discuss with health team member.  Assisted Babygirl with establishing [employer] account and earning her tobacco free and self knowledge badges. Encouraged ongoing participation in the [facility] Rewards Program to assist with healthy lifestyle changes and to be eligible for cash rewards  Will arrange for Link To Wellness follow up after Rafaelita sees the pharmacist in February.      Assessment/Plan: Patient is a 54 year old female with DM2. Most recent A1C in November was 7.6% and is above goal of less than 7%. She has an appointment pending with Dr. Moshe Cipro later this month so I will defer A1C testing to that date. Patient admits that she has been struggling with making healthy eating choices, especially in the past few months. She states she is eating cookies or desserts at least once a day and is also drinking juice. I encouraged her to cut out juice altogether and limit concentrated sweets to twice a week.  Patient is not currently exercising. She used to go to zumba a few years ago. She states that she wants to get back into exercising and is interested in water aerobics. I pulled the water aerobic schedule for the [facility] and gave to her. I challenged her to go once in the next week.  Gave patient a new True Result meter. I will fax Dr. Moshe Cipro on Monday to get a prescription for testing supplies.  Patient will follow up with Kelli Churn, RNCM, CDE. Goals for Next visit-  1. Go to the [facility] here in town for [employer]. You can buy a punch card  at the desk for $32 and it is good for 16 classes. I will email you the schedule. At first, aim for once a week. 2. Cut out fruit juices and just drink water. Cut back to concentrated sweets (desserts, cookies) to once a week. Marcie Bal will contact you for your next appointment

## 2014-12-21 ENCOUNTER — Other Ambulatory Visit: Payer: Self-pay

## 2014-12-31 ENCOUNTER — Ambulatory Visit: Payer: 59 | Admitting: Family Medicine

## 2015-01-05 ENCOUNTER — Other Ambulatory Visit: Payer: Self-pay | Admitting: Family Medicine

## 2015-01-12 ENCOUNTER — Encounter: Payer: Self-pay | Admitting: Podiatry

## 2015-01-12 ENCOUNTER — Ambulatory Visit (INDEPENDENT_AMBULATORY_CARE_PROVIDER_SITE_OTHER): Payer: 59 | Admitting: Podiatry

## 2015-01-12 DIAGNOSIS — Q828 Other specified congenital malformations of skin: Secondary | ICD-10-CM

## 2015-01-12 DIAGNOSIS — B351 Tinea unguium: Secondary | ICD-10-CM

## 2015-01-12 DIAGNOSIS — M79673 Pain in unspecified foot: Secondary | ICD-10-CM | POA: Diagnosis not present

## 2015-01-12 DIAGNOSIS — E1151 Type 2 diabetes mellitus with diabetic peripheral angiopathy without gangrene: Secondary | ICD-10-CM

## 2015-01-12 NOTE — Progress Notes (Signed)
Subjective:     [name], female   DOB: Apr 14, 1961, 54 y.o.   MRN: [id number]  HPI long-term obese diabetic with nail disease 1-5 both feet there incurvated and lesions underneath both feet that are painful when pressed   Review of Systems     Objective:   Physical Exam Neurovascular status diminished with thick yellow brittle nailbeds 1-5 both feet that are painful and severe keratotic lesions plantar aspect both feet that are painful when pressed but did do quite a bit better for treatment of 3 months ago    Assessment:     Chronic mycotic nail infection 1-5 both feet and porokeratotic lesion formation bilateral with at risk diabetic    Plan:     Debridement nailbeds 1-5 both feet and lesions on both feet with no iatrogenic bleeding noted

## 2015-01-18 ENCOUNTER — Other Ambulatory Visit: Payer: Self-pay | Admitting: Family Medicine

## 2015-01-19 NOTE — Progress Notes (Signed)
[name], female   DOB: [birth year], 54 y.o.   MRN: [id number] ATTENDING PHYSICIAN NOTE: I have reviewed the chart and agree with the plan as detailed above. Dorcas Mcmurray MD Pager [phone]

## 2015-01-27 ENCOUNTER — Other Ambulatory Visit: Payer: Self-pay

## 2015-01-27 ENCOUNTER — Other Ambulatory Visit: Payer: Self-pay | Admitting: Family Medicine

## 2015-01-27 ENCOUNTER — Telehealth: Payer: Self-pay

## 2015-01-27 DIAGNOSIS — E785 Hyperlipidemia, unspecified: Secondary | ICD-10-CM

## 2015-01-27 DIAGNOSIS — I10 Essential (primary) hypertension: Secondary | ICD-10-CM

## 2015-01-27 DIAGNOSIS — E1129 Type 2 diabetes mellitus with other diabetic kidney complication: Secondary | ICD-10-CM

## 2015-01-27 DIAGNOSIS — D539 Nutritional anemia, unspecified: Secondary | ICD-10-CM

## 2015-01-27 DIAGNOSIS — Z1159 Encounter for screening for other viral diseases: Secondary | ICD-10-CM

## 2015-01-27 NOTE — Telephone Encounter (Signed)
Labs are all  due and I will know once those are available Pls order microalb, cbc iron and ferritin, fasting lipid, cmp and EGFR, hBA1C, HIV if she agrees Pls remind  Her of the need for eye exam, past due also , Her appt here is end April, I would like it sooner , if possible so labs can be reviewed andhealth updated She still needs her colonoscopy!

## 2015-01-27 NOTE — Telephone Encounter (Signed)
Patient aware.

## 2015-01-27 NOTE — Telephone Encounter (Signed)
She saw on mychart that her iron and ferritin was low back in Nov so she started taking 1 otc iron daily. Is this enough or does she need 1 tab bid? Please advise

## 2015-01-27 NOTE — Telephone Encounter (Signed)
Called patient and left message for them to return call at the office   

## 2015-01-29 LAB — COMPLETE METABOLIC PANEL WITH GFR
ALT: 10 U/L (ref 0–35)
AST: 13 U/L (ref 0–37)
Albumin: 3.8 g/dL (ref 3.5–5.2)
Alkaline Phosphatase: 113 U/L (ref 39–117)
BUN: 10 mg/dL (ref 6–23)
CO2: 26 mEq/L (ref 19–32)
Calcium: 9.3 mg/dL (ref 8.4–10.5)
Chloride: 105 mEq/L (ref 96–112)
Creat: 0.85 mg/dL (ref 0.50–1.10)
GFR, Est African American: >89 mL/min
GFR, Est Non African American: 78 mL/min
Glucose, Bld: 80 mg/dL (ref 70–99)
Potassium: 4.3 mEq/L (ref 3.5–5.3)
Sodium: 141 mEq/L (ref 135–145)
Total Bilirubin: 0.4 mg/dL (ref 0.2–1.2)
Total Protein: 7.3 g/dL (ref 6.0–8.3)

## 2015-01-29 LAB — CBC WITH DIFFERENTIAL/PLATELET
Basophils Absolute: 0.1 10*3/uL (ref 0.0–0.1)
Basophils Relative: 1 % (ref 0–1)
Eosinophils Absolute: 0.2 10*3/uL (ref 0.0–0.7)
Eosinophils Relative: 3 % (ref 0–5)
HCT: 36.9 % (ref 36.0–46.0)
Hemoglobin: 12.2 g/dL (ref 12.0–15.0)
Lymphocytes Relative: 25 % (ref 12–46)
Lymphs Abs: 1.3 10*3/uL (ref 0.7–4.0)
MCH: 28.5 pg (ref 26.0–34.0)
MCHC: 33.1 g/dL (ref 30.0–36.0)
MCV: 86.2 fL (ref 78.0–100.0)
MPV: 10 fL (ref 8.6–12.4)
Monocytes Absolute: 0.9 10*3/uL (ref 0.1–1.0)
Monocytes Relative: 17 % — ABNORMAL HIGH (ref 3–12)
Neutro Abs: 2.7 10*3/uL (ref 1.7–7.7)
Neutrophils Relative %: 54 % (ref 43–77)
Platelets: 282 10*3/uL (ref 150–400)
RBC: 4.28 MIL/uL (ref 3.87–5.11)
RDW: 14.6 % (ref 11.5–15.5)
WBC: 5 10*3/uL (ref 4.0–10.5)

## 2015-01-29 LAB — HIV ANTIBODY (ROUTINE TESTING W REFLEX): HIV 1&2 Ab, 4th Generation: NONREACTIVE

## 2015-01-29 LAB — LIPID PANEL
Cholesterol: 140 mg/dL (ref 0–200)
HDL: 48 mg/dL (ref >=46)
LDL Cholesterol: 76 mg/dL (ref 0–99)
Total CHOL/HDL Ratio: 2.9 Ratio
Triglycerides: 78 mg/dL (ref <150)
VLDL: 16 mg/dL (ref 0–40)

## 2015-01-29 LAB — MICROALBUMIN / CREATININE URINE RATIO
Creatinine, Urine: 152.8 mg/dL
Microalb Creat Ratio: 160.3 mg/g — ABNORMAL HIGH (ref 0.0–30.0)
Microalb, Ur: 24.5 mg/dL — ABNORMAL HIGH (ref <2.0)

## 2015-01-29 LAB — FERRITIN: Ferritin: 89 ng/mL (ref 10–291)

## 2015-01-29 LAB — IRON: Iron: 30 ug/dL — ABNORMAL LOW (ref 42–145)

## 2015-01-29 LAB — HEMOGLOBIN A1C
Hgb A1c MFr Bld: 8.5 % — ABNORMAL HIGH (ref <5.7)
Mean Plasma Glucose: 197 mg/dL — ABNORMAL HIGH (ref <117)

## 2015-02-01 ENCOUNTER — Ambulatory Visit (INDEPENDENT_AMBULATORY_CARE_PROVIDER_SITE_OTHER): Payer: 59 | Admitting: Family Medicine

## 2015-02-01 ENCOUNTER — Encounter: Payer: Self-pay | Admitting: Family Medicine

## 2015-02-01 VITALS — BP 120/74 | HR 95 | Resp 16 | Ht 65.0 in | Wt 303.8 lb

## 2015-02-01 DIAGNOSIS — E1129 Type 2 diabetes mellitus with other diabetic kidney complication: Secondary | ICD-10-CM

## 2015-02-01 DIAGNOSIS — E559 Vitamin D deficiency, unspecified: Secondary | ICD-10-CM

## 2015-02-01 DIAGNOSIS — Z23 Encounter for immunization: Secondary | ICD-10-CM

## 2015-02-01 DIAGNOSIS — J302 Other seasonal allergic rhinitis: Secondary | ICD-10-CM | POA: Diagnosis not present

## 2015-02-01 DIAGNOSIS — I10 Essential (primary) hypertension: Secondary | ICD-10-CM

## 2015-02-01 DIAGNOSIS — E785 Hyperlipidemia, unspecified: Secondary | ICD-10-CM

## 2015-02-01 DIAGNOSIS — E611 Iron deficiency: Secondary | ICD-10-CM

## 2015-02-01 DIAGNOSIS — G4730 Sleep apnea, unspecified: Secondary | ICD-10-CM

## 2015-02-01 DIAGNOSIS — D509 Iron deficiency anemia, unspecified: Secondary | ICD-10-CM

## 2015-02-01 DIAGNOSIS — Z1211 Encounter for screening for malignant neoplasm of colon: Secondary | ICD-10-CM

## 2015-02-01 MED ORDER — FLUTICASONE PROPIONATE 50 MCG/ACT NA SUSP: 2.0000 | Freq: Every day | NASAL | Status: DC

## 2015-02-01 NOTE — Patient Instructions (Signed)
F/u in 3 month, call if you need me before  Commit to daily exercise and reducing any sugar intake please  Prevnar today  Pls schedule and keep appt for eye exam and gyne exam , both are past due  You are refereed to Dr Laural Golden for avg risk screening colonoscopy  Start iron 325 mg one twice daily if you can tolerate this  New for allergies is daily flonase, STOP daily afrin, makes nasal congestion worse  Use CPAP every day   Call weekly with blood sugar  HBA1C, chem 7 and EGFr, iron and ferritin in 3 month

## 2015-02-02 ENCOUNTER — Encounter (INDEPENDENT_AMBULATORY_CARE_PROVIDER_SITE_OTHER): Payer: Self-pay | Admitting: *Deleted

## 2015-02-02 ENCOUNTER — Encounter: Payer: Self-pay | Admitting: Family Medicine

## 2015-02-02 LAB — VITAMIN D 25 HYDROXY (VIT D DEFICIENCY, FRACTURES): Vit D, 25-Hydroxy: 26 ng/mL — ABNORMAL LOW (ref 30–100)

## 2015-02-02 MED ORDER — ERGOCALCIFEROL 1.25 MG (50000 UT) PO CAPS: 50000.0000 [IU] | ORAL_CAPSULE | ORAL | Status: DC

## 2015-02-03 ENCOUNTER — Other Ambulatory Visit: Payer: Self-pay

## 2015-02-03 MED ORDER — IBUPROFEN 800 MG PO TABS: ORAL_TABLET | ORAL | Status: DC

## 2015-02-08 ENCOUNTER — Encounter: Payer: Self-pay | Admitting: Family Medicine

## 2015-02-17 ENCOUNTER — Other Ambulatory Visit: Payer: Self-pay | Admitting: Family Medicine

## 2015-02-18 ENCOUNTER — Telehealth: Payer: Self-pay | Admitting: Family Medicine

## 2015-02-18 NOTE — Telephone Encounter (Signed)
Pls contact pt re need for vit D supplement, the message I sent her is still unread, thank you

## 2015-02-18 NOTE — Telephone Encounter (Signed)
message left  for patient

## 2015-03-08 ENCOUNTER — Ambulatory Visit: Payer: 59 | Admitting: Family Medicine

## 2015-03-14 DIAGNOSIS — Z23 Encounter for immunization: Secondary | ICD-10-CM | POA: Insufficient documentation

## 2015-03-14 DIAGNOSIS — E611 Iron deficiency: Secondary | ICD-10-CM | POA: Insufficient documentation

## 2015-03-14 NOTE — Assessment & Plan Note (Signed)
Controlled, no change in medication DASH diet and commitment to daily physical activity for a minimum of 30 minutes discussed and encouraged, as a part of hypertension management. The importance of attaining a healthy weight is also discussed.  BP/Weight 02/01/2015 12/18/2014 10/12/2014 09/24/2014 08/31/2014 07/09/2014 3/41/9379  Systolic BP 024 - 097 353 [phone]  Diastolic BP 74 - 76 64 68 58 70  Wt. (Lbs) 303.8 310 - 309.12 306 301 303  BMI 50.56 51.59 - 51.44 50.92 50.09 51.23

## 2015-03-14 NOTE — Assessment & Plan Note (Signed)
Deteriorated Patient educated about the importance of limiting  Carbohydrate intake , the need to commit to daily physical activity for a minimum of 30 minutes , and to commit weight loss. .   Diabetic Labs Latest Ref Rng 01/27/2015 09/16/2014 05/30/2014 01/01/2014 12/22/2013  HbA1c <5.7 % 8.5(H) 7.6(H) 7.8(H) - 7.4(H)  Microalbumin <2.0 mg/dL 24.5(H) - - 12.77(H) -  Micro/Creat Ratio 0.0 - 30.0 mg/g 160.3(H) - - 68.0(H) -  Chol 0 - 200 mg/dL 140 - 145 - -  HDL >=46 mg/dL 48 - 44 - -  Calc LDL 0 - 99 mg/dL 76 - 90 - -  Triglycerides <150 mg/dL 78 - 55 - -  Creatinine 0.50 - 1.10 mg/dL 0.85 0.97 0.98 - 0.76   BP/Weight 02/01/2015 12/18/2014 10/12/2014 09/24/2014 08/31/2014 07/09/2014 12/20/2180  Systolic BP 883 - 374 451 460 479 987  Diastolic BP 74 - 76 64 68 58 70  Wt. (Lbs) 303.8 310 - 309.12 306 301 303  BMI 50.56 51.59 - 51.44 50.92 50.09 51.23   Foot/eye exam completion dates 06/09/2014 01/29/2013  Foot Form Completion Done Done

## 2015-03-14 NOTE — Progress Notes (Signed)
Kendra Hanson     MRN: [id number]      DOB: 1961/06/10   HPI Kendra Hanson is here for follow up and re-evaluation of chronic medical conditions, medication management and review of any available recent lab and radiology data.  Preventive health is updated, specifically  Cancer screening and Immunization.  Still needs colonoscopy Questions or concerns regarding consultations or procedures which the PT has had in the interim are  addressed. The PT denies any adverse reactions to current medications since the last visit.  C/o fatigue however, inconsistently using CPAP machine, and reports blood sugar is fluctuating and not as well controlled as it had been, inconsistently testing and still challenged by diet control. ROS Denies recent fever or chills. Denies sinus pressure, nasal congestion, ear pain or sore throat. Denies chest congestion, productive cough or wheezing. Denies chest pains, palpitations and leg swelling Denies abdominal pain, nausea, vomiting,diarrhea or constipation.   Denies dysuria, frequency, hesitancy or incontinence. Denies joint pain, swelling and limitation in mobility. Denies headaches, seizures, numbness, or tingling. Denies depression, anxiety or insomnia. Denies skin break down or rash.   PE  BP 120/74 mmHg  Pulse 95  Resp 16  Ht 5\' 5"  (1.651 m)  Wt 303 lb 12.8 oz (137.803 kg)  BMI 50.56 kg/m2  SpO2 97%  Patient alert and oriented and in no cardiopulmonary distress.  HEENT: No facial asymmetry, EOMI,   oropharynx pink and moist.  Neck supple no JVD, no mass.  Chest: Clear to auscultation bilaterally.  CVS: S1, S2 no murmurs, no S3.Regular rate.  ABD: Soft non tender.   Ext: No edema  MS: Adequate ROM spine, shoulders, hips and knees.  Skin: Intact, no ulcerations or rash noted.  Psych: Good eye contact, normal affect. Memory intact not anxious or depressed appearing.  CNS: CN 2-12 intact, power,  normal throughout.no focal deficits  noted.   Assessment & Plan   Essential hypertension Controlled, no change in medication DASH diet and commitment to daily physical activity for a minimum of 30 minutes discussed and encouraged, as a part of hypertension management. The importance of attaining a healthy weight is also discussed.  BP/Weight 02/01/2015 12/18/2014 10/12/2014 09/24/2014 08/31/2014 07/09/2014 05/13/7792  Systolic BP 903 - 009 233 007 622 633  Diastolic BP 74 - 76 64 68 58 70  Wt. (Lbs) 303.8 310 - 309.12 306 301 303  BMI 50.56 51.59 - 51.44 50.92 50.09 51.23         Seasonal allergies Uncontrolled add daily medication   Hyperlipidemia LDL goal <100 Hyperlipidemia:Low fat diet discussed and encouraged.  Controlled, no change in medication  Lipid Panel  Lab Results  Component Value Date   CHOL 140 01/27/2015   HDL 48 01/27/2015   LDLCALC 76 01/27/2015   TRIG 78 01/27/2015   CHOLHDL 2.9 01/27/2015         Morbid obesity Improved. Patient re-educated about  the importance of commitment to a  minimum of 150 minutes of exercise per week.  The importance of healthy food choices with portion control discussed. Encouraged to start a food diary, count calories and to consider  joining a support group. Sample diet sheets offered. Goals set by the patient for the next several months.   Weight /BMI 02/01/2015 12/18/2014 09/24/2014  WEIGHT 303 lb 12.8 oz 310 lb 309 lb 1.9 oz  HEIGHT 5\' 5"  - 5\' 5"   BMI 50.56 kg/m2 51.59 kg/m2 51.44 kg/m2    Current exercise per week 60 minutes.  Sleep apnea The importance of regularly using CPAP equipment is discussed to maximize benefit and treatment   Type II diabetes mellitus with renal manifestations Deteriorated Patient educated about the importance of limiting  Carbohydrate intake , the need to commit to daily physical activity for a minimum of 30 minutes , and to commit weight loss. .   Diabetic Labs Latest Ref Rng 01/27/2015 09/16/2014 05/30/2014  01/01/2014 12/22/2013  HbA1c <5.7 % 8.5(H) 7.6(H) 7.8(H) - 7.4(H)  Microalbumin <2.0 mg/dL 24.5(H) - - 12.77(H) -  Micro/Creat Ratio 0.0 - 30.0 mg/g 160.3(H) - - 68.0(H) -  Chol 0 - 200 mg/dL 140 - 145 - -  HDL >=46 mg/dL 48 - 44 - -  Calc LDL 0 - 99 mg/dL 76 - 90 - -  Triglycerides <150 mg/dL 78 - 55 - -  Creatinine 0.50 - 1.10 mg/dL 0.85 0.97 0.98 - 0.76   BP/Weight 02/01/2015 12/18/2014 10/12/2014 09/24/2014 08/31/2014 07/09/2014 02/27/3844  Systolic BP 364 - 680 321 224 825 003  Diastolic BP 74 - 76 64 68 58 70  Wt. (Lbs) 303.8 310 - 309.12 306 301 303  BMI 50.56 51.59 - 51.44 50.92 50.09 51.23   Foot/eye exam completion dates 06/09/2014 01/29/2013  Foot Form Completion Done Done        Need for vaccination with 13-polyvalent pneumococcal conjugate vaccine After obtaining informed consent, the vaccine is  administered by LPN.    Iron deficiency Low iron and fatigue, pt to start daily iron supplement, need to have colonoscopy again stressed

## 2015-03-14 NOTE — Assessment & Plan Note (Signed)
The importance of regularly using CPAP equipment is discussed to maximize benefit and treatment

## 2015-03-14 NOTE — Assessment & Plan Note (Signed)
Improved. Patient re-educated about  the importance of commitment to a  minimum of 150 minutes of exercise per week.  The importance of healthy food choices with portion control discussed. Encouraged to start a food diary, count calories and to consider  joining a support group. Sample diet sheets offered. Goals set by the patient for the next several months.   Weight /BMI 02/01/2015 12/18/2014 09/24/2014  WEIGHT 303 lb 12.8 oz 310 lb 309 lb 1.9 oz  HEIGHT 5\' 5"  - 5\' 5"   BMI 50.56 kg/m2 51.59 kg/m2 51.44 kg/m2    Current exercise per week 60 minutes.

## 2015-03-14 NOTE — Assessment & Plan Note (Signed)
Uncontrolled add daily medication

## 2015-03-14 NOTE — Assessment & Plan Note (Signed)
Low iron and fatigue, pt to start daily iron supplement, need to have colonoscopy again stressed

## 2015-03-14 NOTE — Assessment & Plan Note (Signed)
Hyperlipidemia:Low fat diet discussed and encouraged.  Controlled, no change in medication  Lipid Panel  Lab Results  Component Value Date   CHOL 140 01/27/2015   HDL 48 01/27/2015   LDLCALC 76 01/27/2015   TRIG 78 01/27/2015   CHOLHDL 2.9 01/27/2015

## 2015-03-14 NOTE — Assessment & Plan Note (Signed)
After obtaining informed consent, the vaccine is  administered by LPN.  

## 2015-03-16 ENCOUNTER — Telehealth: Payer: Self-pay

## 2015-03-22 ENCOUNTER — Telehealth (INDEPENDENT_AMBULATORY_CARE_PROVIDER_SITE_OTHER): Payer: Self-pay | Admitting: *Deleted

## 2015-03-22 ENCOUNTER — Other Ambulatory Visit (INDEPENDENT_AMBULATORY_CARE_PROVIDER_SITE_OTHER): Payer: Self-pay | Admitting: *Deleted

## 2015-03-22 DIAGNOSIS — Z1211 Encounter for screening for malignant neoplasm of colon: Secondary | ICD-10-CM

## 2015-03-22 NOTE — Telephone Encounter (Signed)
Patient needs trilyte 

## 2015-03-22 NOTE — Telephone Encounter (Signed)
Referring MD/PCP: simpson   Procedure: tcs  Reason/Indication:  screening  Has patient had this procedure before?  no  If so, when, by whom and where?    Is there a family history of colon cancer?  no  Who?  What age when diagnosed?    Is patient diabetic?   yes      Does patient have prosthetic heart valve?  no  Do you have a pacemaker?  no  Has patient ever had endocarditis? no  Has patient had joint replacement within last 12 months?  no  Does patient tend to be constipated or take laxatives? no  Is patient on Coumadin, Plavix and/or Aspirin? no  Medications: see epic  Allergies: nkda  Medication Adjustment: iron 10 days, hold DM meds evening before and morning of  Procedure date & time: 04/05/15 at 730

## 2015-03-23 NOTE — Telephone Encounter (Signed)
agree

## 2015-03-25 ENCOUNTER — Telehealth (INDEPENDENT_AMBULATORY_CARE_PROVIDER_SITE_OTHER): Payer: Self-pay | Admitting: *Deleted

## 2015-03-25 DIAGNOSIS — Z1211 Encounter for screening for malignant neoplasm of colon: Secondary | ICD-10-CM

## 2015-03-25 MED ORDER — PEG 3350-KCL-NA BICARB-NACL 420 G PO SOLR: 4000.0000 mL | Freq: Once | ORAL | Status: DC

## 2015-03-25 MED ORDER — PEG 3350-KCL-NA BICARB-NACL 420 G PO SOLR
4000.0000 mL | Freq: Once | ORAL | Status: DC
Start: 2015-03-25 — End: 2015-04-05

## 2015-03-25 NOTE — Telephone Encounter (Signed)
Patient needs trilyte 

## 2015-03-25 NOTE — Telephone Encounter (Signed)
   Diagnosis:    Result(s)   Card 1:  Negative:           Completed by: Kallee Nam,LPN   HEMOCCULT SENSA DEVELOPER: LOT#:  [id number] EXPIRATION DATE: 9-17   HEMOCCULT SENSA CARD:  LOT#:  [id number] EXPIRATION DATE: 07-18   CARD CONTROL RESULTS:  POSITIVE: Positive NEGATIVE: Negative    ADDITIONAL COMMENTS: Patient was called and given results.

## 2015-03-25 NOTE — Telephone Encounter (Signed)
Stool is guaiac negative. 

## 2015-04-05 ENCOUNTER — Encounter (HOSPITAL_COMMUNITY): Payer: Self-pay

## 2015-04-05 ENCOUNTER — Encounter (HOSPITAL_COMMUNITY)
Admission: RE | Disposition: A | Discharge status: Home / Self Care | Payer: Self-pay | Source: Ambulatory Visit | Attending: Internal Medicine

## 2015-04-05 ENCOUNTER — Ambulatory Visit (HOSPITAL_COMMUNITY)
Admission: RE | Admit: 2015-04-05 | Discharge: 2015-04-05 | Disposition: A | Discharge status: Home / Self Care | Payer: 59 | Source: Ambulatory Visit | Attending: Internal Medicine | Admitting: Internal Medicine

## 2015-04-05 DIAGNOSIS — G4730 Sleep apnea, unspecified: Secondary | ICD-10-CM | POA: Insufficient documentation

## 2015-04-05 DIAGNOSIS — E119 Type 2 diabetes mellitus without complications: Secondary | ICD-10-CM | POA: Diagnosis not present

## 2015-04-05 DIAGNOSIS — K644 Residual hemorrhoidal skin tags: Secondary | ICD-10-CM | POA: Insufficient documentation

## 2015-04-05 DIAGNOSIS — Z9989 Dependence on other enabling machines and devices: Secondary | ICD-10-CM | POA: Insufficient documentation

## 2015-04-05 DIAGNOSIS — Z794 Long term (current) use of insulin: Secondary | ICD-10-CM | POA: Insufficient documentation

## 2015-04-05 DIAGNOSIS — K219 Gastro-esophageal reflux disease without esophagitis: Secondary | ICD-10-CM | POA: Insufficient documentation

## 2015-04-05 DIAGNOSIS — Z6841 Body Mass Index (BMI) 40.0 and over, adult: Secondary | ICD-10-CM | POA: Insufficient documentation

## 2015-04-05 DIAGNOSIS — Z7952 Long term (current) use of systemic steroids: Secondary | ICD-10-CM | POA: Insufficient documentation

## 2015-04-05 DIAGNOSIS — Z1211 Encounter for screening for malignant neoplasm of colon: Secondary | ICD-10-CM | POA: Diagnosis not present

## 2015-04-05 DIAGNOSIS — E669 Obesity, unspecified: Secondary | ICD-10-CM | POA: Diagnosis not present

## 2015-04-05 DIAGNOSIS — Z79899 Other long term (current) drug therapy: Secondary | ICD-10-CM | POA: Diagnosis not present

## 2015-04-05 DIAGNOSIS — E785 Hyperlipidemia, unspecified: Secondary | ICD-10-CM | POA: Insufficient documentation

## 2015-04-05 DIAGNOSIS — Z791 Long term (current) use of non-steroidal anti-inflammatories (NSAID): Secondary | ICD-10-CM | POA: Diagnosis not present

## 2015-04-05 DIAGNOSIS — I10 Essential (primary) hypertension: Secondary | ICD-10-CM | POA: Diagnosis not present

## 2015-04-05 DIAGNOSIS — K648 Other hemorrhoids: Secondary | ICD-10-CM | POA: Diagnosis not present

## 2015-04-05 HISTORY — DX: Gastro-esophageal reflux disease without esophagitis: K21.9

## 2015-04-05 HISTORY — DX: Anemia, unspecified: D64.9

## 2015-04-05 HISTORY — PX: COLONOSCOPY: SHX5424

## 2015-04-05 HISTORY — DX: Other specified postprocedural states: Z98.890

## 2015-04-05 HISTORY — DX: Other specified postprocedural states: R11.2

## 2015-04-05 HISTORY — DX: Nausea with vomiting, unspecified: R11.2

## 2015-04-05 LAB — GLUCOSE, CAPILLARY: Glucose-Capillary: 174 mg/dL — ABNORMAL HIGH (ref 65–99)

## 2015-04-05 SURGERY — COLONOSCOPY
Anesthesia: Moderate Sedation

## 2015-04-05 MED ORDER — MIDAZOLAM HCL 5 MG/5ML IJ SOLN
INTRAMUSCULAR | Status: DC | PRN
  Administered 2015-04-05 (×2): 2 mg via INTRAVENOUS
  Administered 2015-04-05 (×2): 3 mg via INTRAVENOUS

## 2015-04-05 MED ORDER — MIDAZOLAM HCL 5 MG/5ML IJ SOLN
INTRAMUSCULAR | Status: AC
  Filled 2015-04-05: qty 10

## 2015-04-05 MED ORDER — SODIUM CHLORIDE 0.9 % IV SOLN
INTRAVENOUS | Status: DC
  Administered 2015-04-05: 07:00:00 via INTRAVENOUS

## 2015-04-05 MED ORDER — MEPERIDINE HCL 50 MG/ML IJ SOLN
INTRAMUSCULAR | Status: AC
  Filled 2015-04-05: qty 1

## 2015-04-05 MED ORDER — LIDOCAINE HCL 2 % EX GEL
CUTANEOUS | Status: AC
  Filled 2015-04-05: qty 30

## 2015-04-05 MED ORDER — ONDANSETRON HCL 4 MG/2ML IJ SOLN
INTRAMUSCULAR | Status: AC
  Filled 2015-04-05: qty 2

## 2015-04-05 MED ORDER — MEPERIDINE HCL 50 MG/ML IJ SOLN
INTRAMUSCULAR | Status: DC | PRN
  Administered 2015-04-05 (×2): 25 mg

## 2015-04-05 MED ORDER — ONDANSETRON HCL 4 MG/2ML IJ SOLN
INTRAMUSCULAR | Status: DC | PRN
  Administered 2015-04-05: 4 mg via INTRAVENOUS

## 2015-04-05 MED ORDER — STERILE WATER FOR IRRIGATION IR SOLN
Status: DC | PRN
  Administered 2015-04-05: 08:00:00

## 2015-04-05 NOTE — Discharge Instructions (Signed)
Resume usual medications and diet. No driving for 24 hours. Next screening exam in 10 years.  Colonoscopy, Care After Refer to this sheet in the next few weeks. These instructions provide you with information on caring for yourself after your procedure. Your health care provider may also give you more specific instructions. Your treatment has been planned according to current medical practices, but problems sometimes occur. Call your health care provider if you have any problems or questions after your procedure. WHAT TO EXPECT AFTER THE PROCEDURE  After your procedure, it is typical to have the following:  A small amount of blood in your stool.  Moderate amounts of gas and mild abdominal cramping or bloating. HOME CARE INSTRUCTIONS  Do not drive, operate machinery, or sign important documents for 24 hours.  You may shower and resume your regular physical activities, but move at a slower pace for the first 24 hours.  Take frequent rest periods for the first 24 hours.  Walk around or put a warm pack on your abdomen to help reduce abdominal cramping and bloating.  Drink enough fluids to keep your urine clear or pale yellow.  You may resume your normal diet as instructed by your health care provider. Avoid heavy or fried foods that are hard to digest.  Avoid drinking alcohol for 24 hours or as instructed by your health care provider.  Only take over-the-counter or prescription medicines as directed by your health care provider.  If a tissue sample (biopsy) was taken during your procedure:  Do not take aspirin or blood thinners for 7 days, or as instructed by your health care provider.  Do not drink alcohol for 7 days, or as instructed by your health care provider.  Eat soft foods for the first 24 hours. SEEK MEDICAL CARE IF: You have persistent spotting of blood in your stool 2-3 days after the procedure. SEEK IMMEDIATE MEDICAL CARE IF:  You have more than a small spotting of  blood in your stool.  You pass large blood clots in your stool.  Your abdomen is swollen (distended).  You have nausea or vomiting.  You have a fever.  You have increasing abdominal pain that is not relieved with medicine.

## 2015-04-05 NOTE — H&P (Addendum)
Kendra Hanson is an 54 y.o. female.   Chief Complaint: Patient is here for colonoscopy. HPI: Diabetes 54 year old African-American female who is undergoing screening colonoscopy. She was noted  mild anemia with hemoglobin of 11.7 g over 6 months ago. H&H in March the cecum was normal. She denies abdominal pain melena or rectal bleeding in her stool was guaiac negative. Heartburn is well controlled with PPI. She takes ibuprofen occasionally. She has not had a period in 6 years. She has never been screened for CRC. Family history is negative for CRC.  Past Medical History  Diagnosis Date  . Insulin dependent diabetes mellitus 1996  . Hypertension 2008    Normal CBC and CMet in 2012; negative stress nuclear in 2006- patient asymptomatic  . Obesity   . Hyperlipidemia 2008    Lipid profile in 04/2011:136, 53, 43  . Multiple allergies     perennial  . Fibroids     Uterine  . PONV (postoperative nausea and vomiting)   . Sleep apnea     CPAP  . GERD (gastroesophageal reflux disease)   . Anemia     Past Surgical History  Procedure Laterality Date  . Refractive surgery  2011    Bilateral, two seperate occasions first in 2006  . Retinal detachment surgery Bilateral 05/2005  . Dental surgery      Family History  Problem Relation Age of Onset  . Hypertension Mother   . Diabetes Mother   . Kidney failure Mother   . Stroke Mother   . Kidney failure Father   . Diabetes Father   . Heart attack Father   . COPD Sister   . Diabetes Brother   . Diabetes Brother   . Diabetes Brother   . Diabetes Brother   . Diabetes Brother   . Hypertension Brother    Social History:  reports that she has never smoked. She has never used smokeless tobacco. She reports that she does not drink alcohol or use illicit drugs.  Allergies: No Known Allergies  Medications Prior to Admission  Medication Sig Dispense Refill  . betamethasone valerate ointment (VALISONE) 0.1 % Apply 1 application topically 2  (two) times daily. 30 g 0  . CRESTOR 10 MG tablet TAKE 1 TABLET BY MOUTH ONCE DAILY AT BEDTIME 90 tablet 3  . ergocalciferol (VITAMIN D2) 50000 UNITS capsule Take 1 capsule (50,000 Units total) by mouth once a week. One capsule once weekly 12 capsule 3  . exenatide (BYETTA) 10 MCG/0.04ML SOPN injection Inject 0.04 mLs (10 mcg total) into the skin 2 (two) times daily with a meal. 3 pen 3  . fluticasone (FLONASE) 50 MCG/ACT nasal spray Place 2 sprays into both nostrils daily. 48 g 1  . furosemide (LASIX) 20 MG tablet TAKE 1 TABLET BY MOUTH 2 TIMES DAILY. 180 tablet PRN  . ibuprofen (ADVIL,MOTRIN) 800 MG tablet TAKE 1 TABLET BY MOUTH 3 TIMES A DAY AS NEEDED ONLY (30 TO LAST 90 DAYS) 30 tablet 0  . insulin aspart (NOVOLOG FLEXPEN) 100 UNIT/ML FlexPen 12 units in the morning, 10 units at lunchtime , and 12 units at bedtime 15 mL 11  . Lancet Device MISC Use with verio IQ meter three times daily to test blood sugar  100 each PRN  . LANTUS SOLOSTAR 100 UNIT/ML Solostar Pen INJECT 60 UNITS UNDER THE SKIN DAILY 60 mL 12  . loratadine (CLARITIN) 10 MG tablet Take 10 mg by mouth daily.    Marland Kitchen losartan (COZAAR) 100 MG tablet  TAKE 1 TABLET BY MOUTH DAILY. 90 tablet 1  . metFORMIN (GLUCOPHAGE) 1000 MG tablet TAKE 1 TABLET BY MOUTH 2 TIMES DAILY WITH A MEAL 180 tablet 1  . pantoprazole (PROTONIX) 20 MG tablet TAKE 1 TABLET BY MOUTH EVERY DAY 90 tablet 1  . polyethylene glycol-electrolytes (NULYTELY/GOLYTELY) 420 G solution Take 4,000 mLs by mouth once. 4000 mL 0  . potassium chloride (K-DUR,KLOR-CON) 10 MEQ tablet TAKE 2 TABLETS BY MOUTH ONCE DAILY 180 tablet PRN  . TAZTIA XT 360 MG 24 hr capsule TAKE 1 CAPSULE BY MOUTH ONCE DAILY 90 capsule 1  . UNIFINE PENTIPS 31G X 8 MM MISC USE AS DIRECTED 100 each 3    Results for orders placed or performed during the hospital encounter of 04/05/15 (from the past 48 hour(s))  Glucose, capillary     Status: Abnormal   Collection Time: 04/05/15  7:10 AM  Result Value Ref  Range   Glucose-Capillary 174 (H) 65 - 99 mg/dL   No results found.  ROS  Blood pressure 179/97, pulse 102, temperature 98.2 F (36.8 C), temperature source Oral, height 5\' 5"  (1.651 m), weight 303 lb (137.44 kg), SpO2 98 %. Physical Exam  Constitutional: She appears well-developed and well-nourished.  HENT:  Mouth/Throat: Oropharynx is clear and moist.  Eyes: Conjunctivae are normal. No scleral icterus.  Neck: No thyromegaly present.  Cardiovascular: Normal rate, regular rhythm and normal heart sounds.   No murmur heard. Respiratory: Effort normal and breath sounds normal.  GI:  Protuberant but soft abdomen without tenderness organomegaly or masses.  Musculoskeletal: She exhibits no edema.  Lymphadenopathy:    She has no cervical adenopathy.  Neurological: She is alert.  Skin: Skin is warm and dry.     Assessment/Plan Average risk screening colonoscopy.  REHMAN,NAJEEB U 04/05/2015, 7:32 AM

## 2015-04-05 NOTE — Op Note (Signed)
COLONOSCOPY PROCEDURE REPORT  PATIENT:  Kendra Hanson  MR#:  [id number] Birthdate:  November 06, 1961, 54 y.o., female Endoscopist:  Dr. Rogene Houston, MD Referred By:  Dr. Tula Nakayama, MD  Procedure Date: 04/05/2015  Procedure:   Colonoscopy  Indications:  Patient is 54 year old African-American female who is undergoing average risk screening colonoscopy. Last year she was noted to have mild anemia with hemoglobin of 11.7 g and low iron but 2 months ago her H&H was normal.  Informed Consent:  The procedure and risks were reviewed with the patient and informed consent was obtained.  Medications:  Demerol 50 mg IV Versed 10 mg IV  Description of procedure:  After a digital rectal exam was performed, that colonoscope was advanced from the anus through the rectum and colon to the area of the cecum, ileocecal valve and appendiceal orifice. The cecum was deeply intubated. These structures were well-seen and photographed for the record. From the level of the cecum and ileocecal valve, the scope was slowly and cautiously withdrawn. The mucosal surfaces were carefully surveyed utilizing scope tip to flexion to facilitate fold flattening as needed. The scope was pulled down into the rectum where a thorough exam including retroflexion was performed. Terminal ileum was also examined.  Findings:   Prep excellent. Normal mucosa of terminal ileum. Normal mucosa of cecum, ascending colon, hepatic flexure, transverse colon, splenic flexure, descending and sigmoid colon. Normal rectal mucosa. Small hemorrhoids below the dentate line.    Therapeutic/Diagnostic Maneuvers Performed:  None  Complications:  None  Cecal Withdrawal Time:  10 minutes  Impression:  Normal mucosa of terminal ileum. Normal colonoscopy except external hemorrhoids.  Recommendations:  Standard instructions given. Next screening exam in 10 years.  REHMAN,NAJEEB U  04/05/2015 8:07 AM  CC: Dr. Tula Nakayama, MD & Dr.  Rayne Du ref. provider found

## 2015-04-06 ENCOUNTER — Encounter (HOSPITAL_COMMUNITY): Payer: Self-pay | Admitting: Internal Medicine

## 2015-04-20 ENCOUNTER — Other Ambulatory Visit: Payer: Self-pay | Admitting: Family Medicine

## 2015-04-23 LAB — BASIC METABOLIC PANEL WITH GFR
BUN: 21 mg/dL (ref 6–23)
CO2: 29 mEq/L (ref 19–32)
Calcium: 9.3 mg/dL (ref 8.4–10.5)
Chloride: 103 mEq/L (ref 96–112)
Creat: 1.09 mg/dL (ref 0.50–1.10)
GFR, Est African American: 66 mL/min
GFR, Est Non African American: 57 mL/min — ABNORMAL LOW
Glucose, Bld: 50 mg/dL — ABNORMAL LOW (ref 70–99)
Potassium: 4.6 mEq/L (ref 3.5–5.3)
Sodium: 138 mEq/L (ref 135–145)

## 2015-04-23 LAB — FERRITIN: Ferritin: 58 ng/mL (ref 10–291)

## 2015-04-23 LAB — IRON: Iron: 38 ug/dL — ABNORMAL LOW (ref 42–145)

## 2015-04-23 LAB — HEMOGLOBIN A1C
Hgb A1c MFr Bld: 8 % — ABNORMAL HIGH (ref <5.7)
Mean Plasma Glucose: 183 mg/dL — ABNORMAL HIGH (ref <117)

## 2015-04-26 ENCOUNTER — Other Ambulatory Visit: Payer: Self-pay | Admitting: *Deleted

## 2015-04-26 ENCOUNTER — Encounter: Payer: Self-pay | Admitting: *Deleted

## 2015-04-28 NOTE — Patient Outreach (Signed)
[facility] [facility]) Care Management   04/26/15  TACIE MCCUISTION July 10, 1961 [id number]  Kendra Hanson is an 54 y.o. female who presents for routine Link To Wellness follow up for self management assistance of Type II DM.  Subjective:  Kendra Hanson states she is doing well and had her screening colonoscopy on 5/23 which was normal and she will have a repeat colonoscopy in 10 years.  She requested that her labs drawn on 6/9 be reviewed as she will be seeing Dr. Moshe Cipro on 6/23.  Objective:   Review of Systems  Constitutional: Negative.    Filed Weights   04/26/15 1557  Weight: 300 lb (136.079 kg)   Filed Vitals:   04/26/15 1557  BP: 115/60   Physical Exam  Constitutional: She is oriented to person, place, and time. She appears well-developed and well-nourished.  Neurological: She is alert and oriented to person, place, and time.  Skin:     Psychiatric: She has a normal mood and affect. Her behavior is normal. Thought content normal.    Current Medications:   Current Outpatient Prescriptions  Medication Sig Dispense Refill  . betamethasone valerate ointment (VALISONE) 0.1 % APPLY TOPICALLY 2 (TWO) TIMES DAILY 45 g 5  . CRESTOR 10 MG tablet TAKE 1 TABLET BY MOUTH ONCE DAILY AT BEDTIME 90 tablet 3  . ergocalciferol (VITAMIN D2) 50000 UNITS capsule Take 1 capsule (50,000 Units total) by mouth once a week. One capsule once weekly 12 capsule 3  . exenatide (BYETTA) 10 MCG/0.04ML SOPN injection Inject 0.04 mLs (10 mcg total) into the skin 2 (two) times daily with a meal. 3 pen 3  . fluticasone (FLONASE) 50 MCG/ACT nasal spray Place 2 sprays into both nostrils daily. 48 g 1  . furosemide (LASIX) 20 MG tablet TAKE 1 TABLET BY MOUTH 2 TIMES DAILY. 180 tablet PRN  . ibuprofen (ADVIL,MOTRIN) 800 MG tablet TAKE 1 TABLET BY MOUTH 3 TIMES A DAY AS NEEDED ONLY (30 TO LAST 90 DAYS) 30 tablet 0  . insulin aspart (NOVOLOG FLEXPEN) 100 UNIT/ML FlexPen 12 units in the morning, 10 units at  lunchtime , and 12 units at bedtime 15 mL 11  . loratadine (CLARITIN) 10 MG tablet Take 10 mg by mouth daily.    . metFORMIN (GLUCOPHAGE) 1000 MG tablet TAKE 1 TABLET BY MOUTH 2 TIMES DAILY WITH A MEAL 180 tablet 1  . pantoprazole (PROTONIX) 20 MG tablet TAKE 1 TABLET BY MOUTH EVERY DAY 90 tablet 1  . potassium chloride (K-DUR,KLOR-CON) 10 MEQ tablet TAKE 2 TABLETS BY MOUTH ONCE DAILY 180 tablet PRN  . TAZTIA XT 360 MG 24 hr capsule TAKE 1 CAPSULE BY MOUTH ONCE DAILY 90 capsule 1  . UNIFINE PENTIPS 31G X 8 MM MISC USE AS DIRECTED 100 each 3  . Lancet Device MISC Use with verio IQ meter three times daily to test blood sugar  100 each PRN  . LANTUS SOLOSTAR 100 UNIT/ML Solostar Pen INJECT 60 UNITS UNDER THE SKIN DAILY 60 mL 12  . losartan (COZAAR) 100 MG tablet TAKE 1 TABLET BY MOUTH DAILY. 90 tablet 1   No current facility-administered medications for this visit.    Functional Status:   In your present state of health, do you have any difficulty performing the following activities: 04/26/2015  Hearing? N  Vision? N  Difficulty concentrating or making decisions? N  Walking or climbing stairs? N  Dressing or bathing? N  Doing errands, shopping? N    Fall/Depression Screening:  PHQ 2/9 Scores 04/26/2015 06/09/2014  PHQ - 2 Score 1 2  PHQ- 9 Score - 9   THN CM Care Plan Problem One        Patient Outreach from 04/26/2015 in [place] Problem One  Type II DM not meeting target A1C but with improving glycemic control as evidenced by A1C= 8.0 on 04/22/15    Care Plan for Problem One  Active   THN Long Term Goal (31-90 days)  Ongoing improvement in glycemic control as evidenced by improved A1C , <8.0%, at next assessment   THN Long Term Goal Start Date  04/26/15   Interventions for Problem One Long Term Goal  reviewed basic metabolic deficits in Type II DM, reviewed DM medications mechanism of action, advised Kendra Hanson to take Byetta twice daily as directed as she is only  taking it daily,  reveiwed the effect of consistent exercise on  insulin and congratulated her on starting an exercise program of walking , made referral to RD for weight loss assistance and to enable her to earn her Healthy Weight badge, will arrange for Link To Wellness follow up after she sees the RD     Assessment:   Link To Wellness member with Type II DM with improved glycemic control but not meeting target A1C.  Plan:  Referral made to RD. RNCM to fax today's office visit note to Dr. Moshe Cipro. RNCM will meet quarterly and as needed with patient per Link To Wellness program guidelines to assist with Type II DM self-management and assess patient's progress toward mutually set goals.  Barrington Ellison RN,CCM,CDE [place] Management Coordinator Link To Wellness Office Phone [phone] Office Fax [phone]

## 2015-05-03 ENCOUNTER — Ambulatory Visit (INDEPENDENT_AMBULATORY_CARE_PROVIDER_SITE_OTHER): Payer: 59 | Admitting: Podiatry

## 2015-05-03 DIAGNOSIS — Q828 Other specified congenital malformations of skin: Secondary | ICD-10-CM

## 2015-05-03 DIAGNOSIS — B351 Tinea unguium: Secondary | ICD-10-CM

## 2015-05-03 DIAGNOSIS — M79673 Pain in unspecified foot: Secondary | ICD-10-CM

## 2015-05-03 DIAGNOSIS — E1151 Type 2 diabetes mellitus with diabetic peripheral angiopathy without gangrene: Secondary | ICD-10-CM | POA: Diagnosis not present

## 2015-05-04 NOTE — Progress Notes (Signed)
Subjective:     [name], female   DOB: 07-18-1961, 54 y.o.   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

## 2015-05-06 ENCOUNTER — Ambulatory Visit: Payer: 59 | Admitting: Family Medicine

## 2015-05-06 ENCOUNTER — Ambulatory Visit (INDEPENDENT_AMBULATORY_CARE_PROVIDER_SITE_OTHER): Payer: 59 | Admitting: Family Medicine

## 2015-05-06 ENCOUNTER — Encounter: Payer: Self-pay | Admitting: Family Medicine

## 2015-05-06 VITALS — BP 124/76 | HR 86 | Resp 16 | Ht 65.0 in | Wt 304.4 lb

## 2015-05-06 DIAGNOSIS — E785 Hyperlipidemia, unspecified: Secondary | ICD-10-CM

## 2015-05-06 DIAGNOSIS — E1129 Type 2 diabetes mellitus with other diabetic kidney complication: Secondary | ICD-10-CM

## 2015-05-06 DIAGNOSIS — Z01419 Encounter for gynecological examination (general) (routine) without abnormal findings: Secondary | ICD-10-CM

## 2015-05-06 DIAGNOSIS — K219 Gastro-esophageal reflux disease without esophagitis: Secondary | ICD-10-CM

## 2015-05-06 DIAGNOSIS — I10 Essential (primary) hypertension: Secondary | ICD-10-CM | POA: Diagnosis not present

## 2015-05-06 DIAGNOSIS — G4730 Sleep apnea, unspecified: Secondary | ICD-10-CM

## 2015-05-06 DIAGNOSIS — L819 Disorder of pigmentation, unspecified: Secondary | ICD-10-CM

## 2015-05-06 DIAGNOSIS — Z124 Encounter for screening for malignant neoplasm of cervix: Secondary | ICD-10-CM

## 2015-05-06 DIAGNOSIS — E611 Iron deficiency: Secondary | ICD-10-CM | POA: Diagnosis not present

## 2015-05-06 DIAGNOSIS — D259 Leiomyoma of uterus, unspecified: Secondary | ICD-10-CM

## 2015-05-06 NOTE — Patient Instructions (Addendum)
F/u in 3 month, call if you need me before  Congrats on success with improved blood sugar  Need pap,we will refer you to to female gyne in [place] need eye exam please schedule  You are referred to dr [place] Crane re skin lesion on right hand for past 12 month  Fasting lipid, cmp and EGFr, HBa1C, cbc , iron and ferritin, TSH in 3 month   It is important that you exercise regularly at least 30 minutes 5 times a week. If you develop chest pain, have severe difficulty breathing, or feel very tired, stop exercising immediately and seek medical attention    Test sugar twice daily, at least   Goal for fasting blood sugar ranges from 90 to 130 and 2 hours after any meal or at bedtime should be between 140 to 180.

## 2015-05-06 NOTE — Assessment & Plan Note (Signed)
Controlled, no change in medication DASH diet and commitment to daily physical activity for a minimum of 30 minutes discussed and encouraged, as a part of hypertension management. The importance of attaining a healthy weight is also discussed.  BP/Weight 05/06/2015 04/26/2015 04/05/2015 02/01/2015 12/18/2014 10/12/2014 32/35/5732  Systolic BP 202 542 706 237 - 628 315  Diastolic BP 76 60 74 74 - 76 64  Wt. (Lbs) 304.4 300 303 303.8 310 - 309.12  BMI 50.65 49.92 50.42 50.56 51.59 - 51.44

## 2015-05-06 NOTE — Assessment & Plan Note (Signed)
Unchanged Patient re-educated about  the importance of commitment to a  minimum of 150 minutes of exercise per week.  The importance of healthy food choices with portion control discussed. Encouraged to start a food diary, count calories and to consider  joining a support group. Sample diet sheets offered. Goals set by the patient for the next several months.   Weight /BMI 05/06/2015 04/26/2015 04/05/2015  WEIGHT 304 lb 6.4 oz 300 lb 303 lb  HEIGHT 5\' 5"  5\' 5"  5\' 5"   BMI 50.65 kg/m2 49.92 kg/m2 50.42 kg/m2    Current exercise per week 60 minutes.

## 2015-05-06 NOTE — Assessment & Plan Note (Signed)
Resume once daily iron and re check in 3   month

## 2015-05-06 NOTE — Assessment & Plan Note (Signed)
Improved Kendra Hanson is reminded of the importance of commitment to daily physical activity for 30 minutes or more, as able and the need to limit carbohydrate intake to 30 to 60 grams per meal to help with blood sugar control.   The need to take medication as prescribed, test blood sugar as directed, and to call between visits if there is a concern that blood sugar is uncontrolled is also discussed.   Kendra Hanson is reminded of the importance of daily foot exam, annual eye examination, and good blood sugar, blood pressure and cholesterol control.  Diabetic Labs Latest Ref Rng 04/22/2015 01/27/2015 09/16/2014 05/30/2014 01/01/2014  HbA1c <5.7 % 8.0(H) 8.5(H) 7.6(H) 7.8(H) -  Microalbumin <2.0 mg/dL - 24.5(H) - - 12.77(H)  Micro/Creat Ratio 0.0 - 30.0 mg/g - 160.3(H) - - 68.0(H)  Chol 0 - 200 mg/dL - 140 - 145 -  HDL >=46 mg/dL - 48 - 44 -  Calc LDL 0 - 99 mg/dL - 76 - 90 -  Triglycerides <150 mg/dL - 78 - 55 -  Creatinine 0.50 - 1.10 mg/dL 1.09 0.85 0.97 0.98 -   BP/Weight 05/06/2015 04/26/2015 04/05/2015 02/01/2015 12/18/2014 10/12/2014 38/88/2800  Systolic BP 349 179 150 569 - 794 801  Diastolic BP 76 60 74 74 - 76 64  Wt. (Lbs) 304.4 300 303 303.8 310 - 309.12  BMI 50.65 49.92 50.42 50.56 51.59 - 51.44   Foot/eye exam completion dates 06/09/2014 01/29/2013  Foot Form Completion Done Done

## 2015-05-07 ENCOUNTER — Other Ambulatory Visit: Payer: Self-pay

## 2015-05-07 ENCOUNTER — Encounter: Payer: Self-pay | Admitting: Family Medicine

## 2015-05-07 MED ORDER — DILTIAZEM HCL ER BEADS 360 MG PO CP24: 360.0000 mg | ORAL_CAPSULE | Freq: Every day | ORAL | Status: DC

## 2015-05-07 NOTE — Assessment & Plan Note (Signed)
Slowly Enlarging  Hyperpigmented lesion on right dorsum for over 6 months, refer to dermatology for eval and management

## 2015-05-07 NOTE — Assessment & Plan Note (Signed)
Controlled, no change in medication  

## 2015-05-07 NOTE — Assessment & Plan Note (Signed)
Updated lab needed at/ before next visit. Hyperlipidemia:Low fat diet discussed and encouraged.   Lipid Panel  Lab Results  Component Value Date   CHOL 140 01/27/2015   HDL 48 01/27/2015   LDLCALC 76 01/27/2015   TRIG 78 01/27/2015   CHOLHDL 2.9 01/27/2015   Controlled, no change in medication

## 2015-05-07 NOTE — Progress Notes (Signed)
Kendra Hanson     MRN: [id number]      DOB: Jan 23, 1961   HPI Kendra Hanson is here for follow up and re-evaluation of chronic medical conditions, medication management and review of any available recent lab and radiology data.  Preventive health is updated, specifically  Cancer screening and Immunization.  Had colonoscopy, needs eye exam and pap Questions or concerns regarding consultations or procedures which the PT has had in the interim are  addressed. The PT denies any adverse reactions to current medications since the last visit.  Just realized she had not been taking byetta as prescribed, since starting blood sugars have improved, seeing both [facility] and has upcoming appt with diabetic educator Denies polyuria, polydipsia, blurred vision , or hypoglycemic episodes. Tests once daily , advised her to increase to at least twice daily and needs to start regular exercise  ROS Denies recent fever or chills. Denies sinus pressure, nasal congestion, ear pain or sore throat. Denies chest congestion, productive cough or wheezing. Denies chest pains, palpitations and leg swelling Denies abdominal pain, nausea, vomiting,diarrhea or constipation.   Denies dysuria, frequency, hesitancy or incontinence. Denies joint pain, swelling and limitation in mobility. Denies headaches, seizures, numbness, or tingling. Denies depression, anxiety or insomnia. C/o dark spot on right forearm , over 6 months, no bleeding, not pruritic.c/o thickening and cracking of soles of feet   PE  BP 124/76 mmHg  Pulse 86  Resp 16  Ht 5\' 5"  (1.651 m)  Wt 304 lb 6.4 oz (138.075 kg)  BMI 50.65 kg/m2  SpO2 95%  Patient alert and oriented and in no cardiopulmonary distress.  HEENT: No facial asymmetry, EOMI,   oropharynx pink and moist.  Neck supple no JVD, no mass.  Chest: Clear to auscultation bilaterally.  CVS: S1, S2 no murmurs, no S3.Regular rate.  ABD: Soft non tender.   Ext: No edema  MS: Adequate ROM  spine, shoulders, hips and knees.  Skin: Intact, no ulcerations hyperpigmented rash noted on right forearm with variation in pigment, diameter approx 2.5cm Thickening ancd cracking of soles of feet  Psych: Good eye contact, normal affect. Memory intact not anxious or depressed appearing.  CNS: CN 2-12 intact, power,  normal throughout.no focal deficits noted.   Assessment & Plan   Essential hypertension Controlled, no change in medication DASH diet and commitment to daily physical activity for a minimum of 30 minutes discussed and encouraged, as a part of hypertension management. The importance of attaining a healthy weight is also discussed.  BP/Weight 05/06/2015 04/26/2015 04/05/2015 02/01/2015 12/18/2014 10/12/2014 69/48/5462  Systolic BP 703 500 938 182 - 993 716  Diastolic BP 76 60 74 74 - 76 64  Wt. (Lbs) 304.4 300 303 303.8 310 - 309.12  BMI 50.65 49.92 50.42 50.56 51.59 - 51.44        Type II diabetes mellitus with renal manifestations Improved Kendra Hanson is reminded of the importance of commitment to daily physical activity for 30 minutes or more, as able and the need to limit carbohydrate intake to 30 to 60 grams per meal to help with blood sugar control.   The need to take medication as prescribed, test blood sugar as directed, and to call between visits if there is a concern that blood sugar is uncontrolled is also discussed.   Kendra Hanson is reminded of the importance of daily foot exam, annual eye examination, and good blood sugar, blood pressure and cholesterol control.  Diabetic Labs Latest Ref Rng 04/22/2015 01/27/2015  09/16/2014 05/30/2014 01/01/2014  HbA1c <5.7 % 8.0(H) 8.5(H) 7.6(H) 7.8(H) -  Microalbumin <2.0 mg/dL - 24.5(H) - - 12.77(H)  Micro/Creat Ratio 0.0 - 30.0 mg/g - 160.3(H) - - 68.0(H)  Chol 0 - 200 mg/dL - 140 - 145 -  HDL >=46 mg/dL - 48 - 44 -  Calc LDL 0 - 99 mg/dL - 76 - 90 -  Triglycerides <150 mg/dL - 78 - 55 -  Creatinine 0.50 - 1.10 mg/dL  1.09 0.85 0.97 0.98 -   BP/Weight 05/06/2015 04/26/2015 04/05/2015 02/01/2015 12/18/2014 10/12/2014 73/22/0254  Systolic BP 270 623 762 831 - 517 616  Diastolic BP 76 60 74 74 - 76 64  Wt. (Lbs) 304.4 300 303 303.8 310 - 309.12  BMI 50.65 49.92 50.42 50.56 51.59 - 51.44   Foot/eye exam completion dates 06/09/2014 01/29/2013  Foot Form Completion Done Done         Morbid obesity Unchanged Patient re-educated about  the importance of commitment to a  minimum of 150 minutes of exercise per week.  The importance of healthy food choices with portion control discussed. Encouraged to start a food diary, count calories and to consider  joining a support group. Sample diet sheets offered. Goals set by the patient for the next several months.   Weight /BMI 05/06/2015 04/26/2015 04/05/2015  WEIGHT 304 lb 6.4 oz 300 lb 303 lb  HEIGHT 5\' 5"  5\' 5"  5\' 5"   BMI 50.65 kg/m2 49.92 kg/m2 50.42 kg/m2    Current exercise per week 60 minutes.   Iron deficiency Resume once daily iron and re check in 3   month  GERD (gastroesophageal reflux disease) Controlled, no change in medication   Pigmented skin lesion Slowly Enlarging  Hyperpigmented lesion on right dorsum for over 6 months, refer to dermatology for eval and management   Leiomyoma of uterus Pap past due and  she is iron deficient , refer to gyne for exam and pap, wishes female gyne will refer her to Dr Leo Grosser  Sleep apnea Still not using her CPAP machine  as consistently as she should, advised to do so for improved health and keep regular follow up with neurology   Hyperlipidemia LDL goal <100 Updated lab needed at/ before next visit. Hyperlipidemia:Low fat diet discussed and encouraged.   Lipid Panel  Lab Results  Component Value Date   CHOL 140 01/27/2015   HDL 48 01/27/2015   LDLCALC 76 01/27/2015   TRIG 78 01/27/2015   CHOLHDL 2.9 01/27/2015   Controlled, no change in medication

## 2015-05-07 NOTE — Assessment & Plan Note (Signed)
Pap past due and  she is iron deficient , refer to gyne for exam and pap, wishes female gyne will refer her to Dr Leo Grosser

## 2015-05-07 NOTE — Assessment & Plan Note (Signed)
Still not using her CPAP machine  as consistently as she should, advised to do so for improved health and keep regular follow up with neurology

## 2015-05-24 ENCOUNTER — Other Ambulatory Visit: Payer: Self-pay | Admitting: Family Medicine

## 2015-05-27 ENCOUNTER — Telehealth: Payer: Self-pay

## 2015-05-27 ENCOUNTER — Other Ambulatory Visit: Payer: Self-pay

## 2015-05-27 MED ORDER — FIRST-DUKES MOUTHWASH MT SUSP: 5.0000 mL | Freq: Four times a day (QID) | OROMUCOSAL | Status: DC

## 2015-05-27 NOTE — Telephone Encounter (Signed)
Patient aware and med sent  

## 2015-05-27 NOTE — Telephone Encounter (Signed)
yes I do, also advise tylenol 500mg  one twice daily as needed, also salt water gargles 3 times daily as needed

## 2015-05-31 ENCOUNTER — Encounter: Payer: Self-pay | Admitting: Family Medicine

## 2015-05-31 ENCOUNTER — Ambulatory Visit (INDEPENDENT_AMBULATORY_CARE_PROVIDER_SITE_OTHER): Payer: 59 | Admitting: Family Medicine

## 2015-05-31 VITALS — BP 128/78 | HR 87 | Temp 99.0°F | Resp 16 | Ht 65.0 in | Wt 299.0 lb

## 2015-05-31 DIAGNOSIS — I10 Essential (primary) hypertension: Secondary | ICD-10-CM | POA: Diagnosis not present

## 2015-05-31 DIAGNOSIS — J020 Streptococcal pharyngitis: Secondary | ICD-10-CM | POA: Diagnosis not present

## 2015-05-31 LAB — POCT RAPID STREP A (OFFICE): Rapid Strep A Screen: POSITIVE — AB

## 2015-05-31 MED ORDER — FLUCONAZOLE 150 MG PO TABS: 150.0000 mg | ORAL_TABLET | Freq: Once | ORAL | Status: DC

## 2015-05-31 MED ORDER — PENICILLIN V POTASSIUM 500 MG PO TABS: 500.0000 mg | ORAL_TABLET | Freq: Three times a day (TID) | ORAL | Status: DC

## 2015-05-31 NOTE — Patient Instructions (Signed)
F/u as before  You are treated for strep pharyngitis  Work excuse to return on 06/02/2015  Ten day course of penicillin is prescribed, it is important to take the entire course  Strep Throat Strep throat is an infection of the throat. It is caused by a germ. Strep throat spreads from person to person by coughing, sneezing, or close contact. HOME CARE  Rinse your mouth (gargle) with warm salt water (1 teaspoon salt in 1 cup of water). Do this 3 to 4 times per day or as needed for comfort.  Family members with a sore throat or fever should see a doctor.  Make sure everyone in your house washes their hands well.  Do not share food, drinking cups, or personal items.  Eat soft foods until your sore throat gets better.  Drink enough water and fluids to keep your pee (urine) clear or pale yellow.  Rest.  Stay home from school, daycare, or work until you have taken medicine for 24 hours.  Only take medicine as told by your doctor.  Take your medicine as told. Finish it even if you start to feel better. GET HELP RIGHT AWAY IF:   You have new problems, such as throwing up (vomiting) or bad headaches.  You have a stiff or painful neck, chest pain, trouble breathing, or trouble swallowing.  You have very bad throat pain, drooling, or changes in your voice.  Your neck puffs up (swells) or gets red and tender.  You have a fever.  You are very tired, your mouth is dry, or you are peeing less than normal.  You cannot wake up completely.  You get a rash, cough, or earache.  You have green, yellow-brown, or bloody spit.  Your pain does not get better with medicine. MAKE SURE YOU:   Understand these instructions.  Will watch your condition.  Will get help right away if you are not doing well or get worse. Document Released: 04/17/2008 Document Revised: 01/22/2012 Document Reviewed: 12/29/2010 [facility] Patient Information 2015 [place], [place]. This information is not intended  to replace advice given to you by your health care provider. Make sure you discuss any questions you have with your health care provider.

## 2015-06-06 NOTE — Assessment & Plan Note (Signed)
Acute infection, 10 day course of penicillin, work excuse x 24 hours

## 2015-06-06 NOTE — Progress Notes (Signed)
   Kendra Hanson     MRN: [id number]      DOB: May 04, 1961   HPI Kendra Hanson is here with a 3 day h/o sore throat , and chills, she has had post nasal drainage and cough for 1 week, sputrum is clear ROS  Denies sinus pressure, does have  nasal congestion, ear pain and  sore throat. Denies chest congestion,  or wheezing. Denies chest pains, palpitations and leg swelling Denies abdominal pain, nausea, vomiting,diarrhea or constipation.   Denies dysuria, frequency, hesitancy or incontinence.  Denies skin break down or rash.   PE  BP 128/78 mmHg  Pulse 87  Temp(Src) 99 F (37.2 C) (Oral)  Resp 16  Ht 5\' 5"  (1.651 m)  Wt 299 lb (135.626 kg)  BMI 49.76 kg/m2  SpO2 99%  Patient alert and oriented and in no cardiopulmonary distress.  HEENT: No facial asymmetry, EOMI,   oropharynx erythematous, no exudate, and moist.  Neck supple no JVD,tender left anterior cervical adenopathy, TM clear bilaterally  Chest: Clear to auscultation bilaterally.  CVS: S1, S2 no murmurs, no S3.Regular rate.  ABD: Soft non tender.   Ext: No edema  MS: Adequate ROM spine, shoulders, hips and knees.  Skin: Intact, no ulcerations or rash noted.  Psych: Good eye contact, normal affect. Memory intact not anxious or depressed appearing.  CNS: CN 2-12 intact, power,  normal throughout.no focal deficits noted.   Assessment & Plan   Strep pharyngitis Acute infection, 10 day course of penicillin, work excuse x 24 hours  Essential hypertension Controlled, no change in medication DASH diet and commitment to daily physical activity for a minimum of 30 minutes discussed and encouraged, as a part of hypertension management. The importance of attaining a healthy weight is also discussed.  BP/Weight 05/31/2015 05/06/2015 04/26/2015 04/05/2015 02/01/2015 12/18/2014 98/33/8250  Systolic BP 539 767 341 937 902 - 409  Diastolic BP 78 76 60 74 74 - 76  Wt. (Lbs) 299 304.4 300 303 303.8 310 -  BMI 49.76 50.65  49.92 50.42 50.56 51.59 -        Morbid obesity Improved. Patient re-educated about  the importance of commitment to a  minimum of 150 minutes of exercise per week.  The importance of healthy food choices with portion control discussed. Encouraged to start a food diary, count calories and to consider  joining a support group. Sample diet sheets offered. Goals set by the patient for the next several months.   Weight /BMI 05/31/2015 05/06/2015 04/26/2015  WEIGHT 299 lb 304 lb 6.4 oz 300 lb  HEIGHT 5\' 5"  5\' 5"  5\' 5"   BMI 49.76 kg/m2 50.65 kg/m2 49.92 kg/m2    Current exercise per week 90 minutes.

## 2015-06-06 NOTE — Assessment & Plan Note (Signed)
Improved. Patient re-educated about  the importance of commitment to a  minimum of 150 minutes of exercise per week.  The importance of healthy food choices with portion control discussed. Encouraged to start a food diary, count calories and to consider  joining a support group. Sample diet sheets offered. Goals set by the patient for the next several months.   Weight /BMI 05/31/2015 05/06/2015 04/26/2015  WEIGHT 299 lb 304 lb 6.4 oz 300 lb  HEIGHT 5\' 5"  5\' 5"  5\' 5"   BMI 49.76 kg/m2 50.65 kg/m2 49.92 kg/m2    Current exercise per week 90 minutes.

## 2015-06-06 NOTE — Assessment & Plan Note (Signed)
Controlled, no change in medication DASH diet and commitment to daily physical activity for a minimum of 30 minutes discussed and encouraged, as a part of hypertension management. The importance of attaining a healthy weight is also discussed.  BP/Weight 05/31/2015 05/06/2015 04/26/2015 04/05/2015 02/01/2015 12/18/2014 38/32/9191  Systolic BP 660 600 459 977 414 - 239  Diastolic BP 78 76 60 74 74 - 76  Wt. (Lbs) 299 304.4 300 303 303.8 310 -  BMI 49.76 50.65 49.92 50.42 50.56 51.59 -

## 2015-06-08 LAB — HM PAP SMEAR: HM Pap smear: NORMAL

## 2015-07-02 ENCOUNTER — Ambulatory Visit: Payer: 59 | Admitting: Nutrition

## 2015-07-08 ENCOUNTER — Telehealth: Payer: Self-pay | Admitting: *Deleted

## 2015-07-08 ENCOUNTER — Telehealth: Payer: Self-pay

## 2015-07-08 MED ORDER — GABAPENTIN 100 MG PO CAPS: 100.0000 mg | ORAL_CAPSULE | Freq: Every day | ORAL | Status: DC

## 2015-07-08 NOTE — Telephone Encounter (Signed)
In detail message left and let her know med sent in and to call back and schedule next week

## 2015-07-08 NOTE — Telephone Encounter (Signed)
Open in ERROR

## 2015-07-08 NOTE — Telephone Encounter (Signed)
States both feet and legs have been burning x 1 month and also swelling sometimes throbbing pain. Already on lasix and potassium BID. Any med need to be prescribed for neuropathy? Wanted to be seen today but no openings. Please advise

## 2015-07-08 NOTE — Telephone Encounter (Signed)
Pls advise reduce salt intake /canned foods ,  Soda.  During warm months esp swelling a big problem, mAY need to see vascular Doc to see if she needs compression hose for circulation issues and to reduce swelling  For neuropathy I recommend gabapentin pls send 100 mg at bedtime Arrang e appt next week also pls/

## 2015-07-12 ENCOUNTER — Emergency Department (HOSPITAL_COMMUNITY)
Admission: EM | Admit: 2015-07-12 | Discharge: 2015-07-12 | Disposition: A | Discharge status: Other Acute Inpatient Hospital | Payer: 59 | Attending: Family Medicine | Admitting: Family Medicine

## 2015-07-12 ENCOUNTER — Ambulatory Visit (INDEPENDENT_AMBULATORY_CARE_PROVIDER_SITE_OTHER): Payer: 59 | Admitting: Family Medicine

## 2015-07-12 ENCOUNTER — Encounter: Payer: Self-pay | Admitting: Family Medicine

## 2015-07-12 ENCOUNTER — Ambulatory Visit (HOSPITAL_COMMUNITY)
Admission: EM | Admit: 2015-07-12 | Discharge: 2015-07-12 | Disposition: A | Discharge status: Other Acute Inpatient Hospital | Payer: 59 | Attending: Family Medicine | Admitting: Family Medicine

## 2015-07-12 VITALS — BP 128/80 | HR 100 | Resp 16 | Ht 65.0 in | Wt 306.1 lb

## 2015-07-12 DIAGNOSIS — Z794 Long term (current) use of insulin: Secondary | ICD-10-CM

## 2015-07-12 DIAGNOSIS — IMO0001 Reserved for inherently not codable concepts without codable children: Secondary | ICD-10-CM

## 2015-07-12 DIAGNOSIS — G5601 Carpal tunnel syndrome, right upper limb: Secondary | ICD-10-CM

## 2015-07-12 DIAGNOSIS — E1122 Type 2 diabetes mellitus with diabetic chronic kidney disease: Secondary | ICD-10-CM

## 2015-07-12 DIAGNOSIS — M47897 Other spondylosis, lumbosacral region: Secondary | ICD-10-CM | POA: Diagnosis not present

## 2015-07-12 DIAGNOSIS — R600 Localized edema: Secondary | ICD-10-CM

## 2015-07-12 DIAGNOSIS — N189 Chronic kidney disease, unspecified: Secondary | ICD-10-CM

## 2015-07-12 DIAGNOSIS — R208 Other disturbances of skin sensation: Secondary | ICD-10-CM

## 2015-07-12 DIAGNOSIS — G5600 Carpal tunnel syndrome, unspecified upper limb: Secondary | ICD-10-CM | POA: Insufficient documentation

## 2015-07-12 DIAGNOSIS — R200 Anesthesia of skin: Secondary | ICD-10-CM | POA: Insufficient documentation

## 2015-07-12 DIAGNOSIS — G5603 Carpal tunnel syndrome, bilateral upper limbs: Secondary | ICD-10-CM

## 2015-07-12 DIAGNOSIS — M7989 Other specified soft tissue disorders: Secondary | ICD-10-CM | POA: Diagnosis not present

## 2015-07-12 DIAGNOSIS — M5440 Lumbago with sciatica, unspecified side: Secondary | ICD-10-CM | POA: Insufficient documentation

## 2015-07-12 DIAGNOSIS — G5602 Carpal tunnel syndrome, left upper limb: Secondary | ICD-10-CM

## 2015-07-12 DIAGNOSIS — E1065 Type 1 diabetes mellitus with hyperglycemia: Secondary | ICD-10-CM

## 2015-07-12 DIAGNOSIS — L819 Disorder of pigmentation, unspecified: Secondary | ICD-10-CM

## 2015-07-12 DIAGNOSIS — E611 Iron deficiency: Secondary | ICD-10-CM

## 2015-07-12 DIAGNOSIS — I10 Essential (primary) hypertension: Secondary | ICD-10-CM

## 2015-07-12 DIAGNOSIS — E1165 Type 2 diabetes mellitus with hyperglycemia: Secondary | ICD-10-CM

## 2015-07-12 MED ORDER — POTASSIUM CHLORIDE CRYS ER 10 MEQ PO TBCR: 10.0000 meq | EXTENDED_RELEASE_TABLET | Freq: Three times a day (TID) | ORAL | Status: DC

## 2015-07-12 MED ORDER — FUROSEMIDE 20 MG PO TABS: 20.0000 mg | ORAL_TABLET | Freq: Three times a day (TID) | ORAL | Status: DC

## 2015-07-12 NOTE — Patient Instructions (Signed)
F/u as before  Labs SEPTEMBER 10 OR AFTER  iNCREASE LASIX TO ONE TAB 3 TIMES DAILY  INCREASE POTASSIUM TO ONE TABLET 3 TIMES DAILY  XRAY OF LOW BACK TODAY  YOU ARE REFERRED TO HAND SURGEON RE HAND PAIN AND WEAKNESS   YOU ARE REFERRED TO VASCULAR RE LEG SWELLING

## 2015-07-12 NOTE — Assessment & Plan Note (Signed)
PROGRESSIVE WEAKNESS AND BURNING PAIN IN BOTH HANDS, INCREASED DIFFICULTY HOLDING OBJECTS, dR gRAMIG TO EVAL AND TREAT

## 2015-07-13 NOTE — Progress Notes (Signed)
Kendra Hanson     MRN: [id number]      DOB: 09-29-1961   HPI Kendra Hanson is here with multiple concerns. She c/o increased  Lower extremity swelling, pain and numbness in hands and feet, unable to stand for long periods due to both , was recently out of work because of this Feels as though all health problems are related to diabetes and that her health is overall spiralling down ward out of control  ROS Denies recent fever or chills. Denies sinus pressure, nasal congestion, ear pain or sore throat. Denies chest congestion, productive cough or wheezing. Denies chest pains, palpitations and leg swelling Denies abdominal pain, nausea, vomiting,diarrhea or constipation.   Denies dysuria, frequency, hesitancy or incontinence. C/o low back and lower extremity pain with numbness and tingling in both hands and feet, also increased weakness of hands, has been diagnosed with carpal tunnel syndrome in the past, difficulty opening jars and holding things. Feels overwhelmed, attributes symptoms to her diabetes  C/o rash on right hand, was a "bag of nerves" when she was told by the dermatologist that the rash was present because of her diabetes PE  BP 128/80 mmHg  Pulse 100  Resp 16  Ht 5\' 5"  (1.651 m)  Wt 306 lb 1.9 oz (138.855 kg)  BMI 50.94 kg/m2  SpO2 98%  Patient alert and oriented and in no cardiopulmonary distress.  HEENT: No facial asymmetry, EOMI,   oropharynx pink and moist.  Neck supple no JVD, no mass.  Chest: Clear to auscultation bilaterally.  CVS: S1, S2 no murmurs, no S3.Regular rate.  ABD: Soft non tender.   Ext: one plus pitting edema bilaterally  MS: Adequate though reduced  ROM lumbar  Spine,normal in  shoulders, hips and knees.  Skin: Intact, hyperpigmented  rash noted.on dorsum of right hand  Psych: Good eye contact, normal affect. Memory intact , very  anxious , mildly tearful, not depressed appearing.  CNS: CN 2-12 intact, grade 4 power in hands with  thenar wasting of left hand  Assessment & Plan   Carpal tunnel syndrome, bilateral PROGRESSIVE WEAKNESS AND BURNING PAIN IN BOTH HANDS, INCREASED DIFFICULTY HOLDING OBJECTS, dR gRAMIG TO EVAL AND TREAT  Bilateral leg numbness Bilateral lower extremity numbness, likely due both to diabetic neuropathy as well as arthritis and disc disease causing nerve irritation. Start  Gabapentin at bedtime and referred to neurologist for further evaluation for specific nerve damage  Carpal tunnel syndrome Increased and uncontrolled bilateral weakness and numbness of both hands, which is negatively affecting her ability to use hands, refer to hand surgeon for definitive management  Pigmented skin lesion Recently assessed by dermatology ho informed her this was a result of her diabetes, which has her extremely stressed  Iron deficiency Updated lab needed at/ before next visit. Currently on supplement  Essential hypertension Controlled, no change in medication DASH diet and commitment to daily physical activity for a minimum of 30 minutes discussed and encouraged, as a part of hypertension management. The importance of attaining a healthy weight is also discussed.  BP/Weight 07/12/2015 05/31/2015 05/06/2015 04/26/2015 04/05/2015 5/00/9381 06/14/9936  Systolic BP 169 678 938 101 751 025 -  Diastolic BP 80 78 76 60 74 74 -  Wt. (Lbs) 306.12 299 304.4 300 303 303.8 310  BMI 50.94 49.76 50.65 49.92 50.42 50.56 51.59             Morbid obesity Deteriorated. Patient re-educated about  the importance of commitment to a  minimum  of 150 minutes of exercise per week.  The importance of healthy food choices with portion control discussed. Encouraged to start a food diary, count calories and to consider  joining a support group. Sample diet sheets offered. Goals set by the patient for the next several months.   Weight /BMI 07/12/2015 05/31/2015 05/06/2015  WEIGHT 306 lb 1.9 oz 299 lb 304 lb 6.4 oz    HEIGHT 5\' 5"  5\' 5"  5\' 5"   BMI 50.94 kg/m2 49.76 kg/m2 50.65 kg/m2    Current exercise per week 90 minutes.   Type II diabetes mellitus with renal manifestations Uncontrolled Kendra Hanson is reminded of the importance of commitment to daily physical activity for 30 minutes or more, as able and the need to limit carbohydrate intake to 30 to 60 grams per meal to help with blood sugar control.   The need to take medication as prescribed, test blood sugar as directed, and to call between visits if there is a concern that blood sugar is uncontrolled is also discussed.   Kendra Hanson is reminded of the importance of daily foot exam, annual eye examination, and good blood sugar, blood pressure and cholesterol control.  Diabetic Labs Latest Ref Rng 08/06/2015 04/22/2015 01/27/2015 09/16/2014 05/30/2014  HbA1c <5.7 % 7.4(H) 8.0(H) 8.5(H) 7.6(H) 7.8(H)  Microalbumin <2.0 mg/dL - - 24.5(H) - -  Micro/Creat Ratio 0.0 - 30.0 mg/g - - 160.3(H) - -  Chol 125 - 200 mg/dL 146 - 140 - 145  HDL >=46 mg/dL 48 - 48 - 44  Calc LDL <130 mg/dL 84 - 76 - 90  Triglycerides <150 mg/dL 72 - 78 - 55  Creatinine 0.50 - 1.05 mg/dL 1.03 1.09 0.85 0.97 0.98   BP/Weight 07/12/2015 05/31/2015 05/06/2015 04/26/2015 04/05/2015 02/21/3012 11/16/3886  Systolic BP 757 972 820 601 561 537 -  Diastolic BP 80 78 76 60 74 74 -  Wt. (Lbs) 306.12 299 304.4 300 303 303.8 310  BMI 50.94 49.76 50.65 49.92 50.42 50.56 51.59   Foot/eye exam completion dates 07/12/2015 06/09/2014  Foot Form Completion Done Done   BP/Weight 07/12/2015 05/31/2015 05/06/2015 04/26/2015 04/05/2015 9/43/2761 02/18/928  Systolic BP 574 734 037 096 438 381 -  Diastolic BP 80 78 76 60 74 74 -  Wt. (Lbs) 306.12 299 304.4 300 303 303.8 310  BMI 50.94 49.76 50.65 49.92 50.42 50.56 51.59   Foot/eye exam completion dates 07/12/2015 06/09/2014  Foot Form Completion Done Done             Bilateral leg edema Refer to vascular for complete eval pt very distressed as far  as pain and swelling of lower extremities

## 2015-07-14 ENCOUNTER — Telehealth: Payer: Self-pay | Admitting: *Deleted

## 2015-07-14 NOTE — Telephone Encounter (Signed)
Noted  

## 2015-07-14 NOTE — Telephone Encounter (Signed)
Pt is going to call [place] back

## 2015-07-15 ENCOUNTER — Ambulatory Visit: Payer: 59 | Admitting: Nutrition

## 2015-07-21 ENCOUNTER — Other Ambulatory Visit: Payer: Self-pay | Admitting: *Deleted

## 2015-07-21 DIAGNOSIS — I83893 Varicose veins of bilateral lower extremities with other complications: Secondary | ICD-10-CM

## 2015-08-02 ENCOUNTER — Telehealth: Payer: Self-pay | Admitting: *Deleted

## 2015-08-02 NOTE — Telephone Encounter (Signed)
Patient cannot see Dr. Merlene Laughter due to a outstanding balance, I called and made patient aware, as of now patient said she has so many appts she wants to wait on neurology for a little while. I made her aware to call me when she is ready to schedule for neurology so I can send her referral.

## 2015-08-06 ENCOUNTER — Other Ambulatory Visit: Payer: Self-pay | Admitting: Family Medicine

## 2015-08-06 LAB — CBC
HCT: 35.7 % — ABNORMAL LOW (ref 36.0–46.0)
Hemoglobin: 11.7 g/dL — ABNORMAL LOW (ref 12.0–15.0)
MCH: 28.5 pg (ref 26.0–34.0)
MCHC: 32.8 g/dL (ref 30.0–36.0)
MCV: 87.1 fL (ref 78.0–100.0)
MPV: 9.9 fL (ref 8.6–12.4)
Platelets: 324 10*3/uL (ref 150–400)
RBC: 4.1 MIL/uL (ref 3.87–5.11)
RDW: 14.6 % (ref 11.5–15.5)
WBC: 9.1 10*3/uL (ref 4.0–10.5)

## 2015-08-07 LAB — TSH: TSH: 2.926 u[IU]/mL (ref 0.350–4.500)

## 2015-08-07 LAB — LIPID PANEL
Cholesterol: 146 mg/dL (ref 125–200)
HDL: 48 mg/dL (ref >=46)
LDL Cholesterol: 84 mg/dL (ref <130)
Total CHOL/HDL Ratio: 3 Ratio (ref <=5.0)
Triglycerides: 72 mg/dL (ref <150)
VLDL: 14 mg/dL (ref <30)

## 2015-08-07 LAB — HEMOGLOBIN A1C
Hgb A1c MFr Bld: 7.4 % — ABNORMAL HIGH (ref <5.7)
Mean Plasma Glucose: 166 mg/dL — ABNORMAL HIGH (ref <117)

## 2015-08-07 LAB — FERRITIN: Ferritin: 63 ng/mL (ref 10–291)

## 2015-08-07 LAB — COMPLETE METABOLIC PANEL WITH GFR
ALT: 7 U/L (ref 6–29)
AST: 11 U/L (ref 10–35)
Albumin: 3.8 g/dL (ref 3.6–5.1)
Alkaline Phosphatase: 99 U/L (ref 33–130)
BUN: 16 mg/dL (ref 7–25)
CO2: 28 mmol/L (ref 20–31)
Calcium: 8.8 mg/dL (ref 8.6–10.4)
Chloride: 104 mmol/L (ref 98–110)
Creat: 1.03 mg/dL (ref 0.50–1.05)
GFR, Est African American: 71 mL/min (ref >=60)
GFR, Est Non African American: 61 mL/min (ref >=60)
Glucose, Bld: 117 mg/dL — ABNORMAL HIGH (ref 65–99)
Potassium: 4.8 mmol/L (ref 3.5–5.3)
Sodium: 140 mmol/L (ref 135–146)
Total Bilirubin: 0.4 mg/dL (ref 0.2–1.2)
Total Protein: 6.8 g/dL (ref 6.1–8.1)

## 2015-08-07 LAB — IRON: Iron: 43 ug/dL — ABNORMAL LOW (ref 45–160)

## 2015-08-08 DIAGNOSIS — R600 Localized edema: Secondary | ICD-10-CM | POA: Insufficient documentation

## 2015-08-08 NOTE — Assessment & Plan Note (Signed)
Recently assessed by dermatology ho informed her this was a result of her diabetes, which has her extremely stressed

## 2015-08-08 NOTE — Assessment & Plan Note (Signed)
Increased and uncontrolled bilateral weakness and numbness of both hands, which is negatively affecting her ability to use hands, refer to hand surgeon for definitive management

## 2015-08-08 NOTE — Assessment & Plan Note (Signed)
Deteriorated. Patient re-educated about  the importance of commitment to a  minimum of 150 minutes of exercise per week.  The importance of healthy food choices with portion control discussed. Encouraged to start a food diary, count calories and to consider  joining a support group. Sample diet sheets offered. Goals set by the patient for the next several months.   Weight /BMI 07/12/2015 05/31/2015 05/06/2015  WEIGHT 306 lb 1.9 oz 299 lb 304 lb 6.4 oz  HEIGHT 5\' 5"  5\' 5"  5\' 5"   BMI 50.94 kg/m2 49.76 kg/m2 50.65 kg/m2    Current exercise per week 90 minutes.

## 2015-08-08 NOTE — Assessment & Plan Note (Signed)
Updated lab needed at/ before next visit. Currently on supplement

## 2015-08-08 NOTE — Assessment & Plan Note (Addendum)
Refer to vascular for complete eval pt very distressed as far as pain and swelling of lower extremities Dose increase in lasix and potassium

## 2015-08-08 NOTE — Assessment & Plan Note (Addendum)
Uncontrolled Kendra Hanson is reminded of the importance of commitment to daily physical activity for 30 minutes or more, as able and the need to limit carbohydrate intake to 30 to 60 grams per meal to help with blood sugar control.   The need to take medication as prescribed, test blood sugar as directed, and to call between visits if there is a concern that blood sugar is uncontrolled is also discussed.   Kendra Hanson is reminded of the importance of daily foot exam, annual eye examination, and good blood sugar, blood pressure and cholesterol control.  Diabetic Labs Latest Ref Rng 08/06/2015 04/22/2015 01/27/2015 09/16/2014 05/30/2014  HbA1c <5.7 % 7.4(H) 8.0(H) 8.5(H) 7.6(H) 7.8(H)  Microalbumin <2.0 mg/dL - - 24.5(H) - -  Micro/Creat Ratio 0.0 - 30.0 mg/g - - 160.3(H) - -  Chol 125 - 200 mg/dL 146 - 140 - 145  HDL >=46 mg/dL 48 - 48 - 44  Calc LDL <130 mg/dL 84 - 76 - 90  Triglycerides <150 mg/dL 72 - 78 - 55  Creatinine 0.50 - 1.05 mg/dL 1.03 1.09 0.85 0.97 0.98   BP/Weight 07/12/2015 05/31/2015 05/06/2015 04/26/2015 04/05/2015 6/38/4665 07/22/3569  Systolic BP 177 939 030 092 330 076 -  Diastolic BP 80 78 76 60 74 74 -  Wt. (Lbs) 306.12 299 304.4 300 303 303.8 310  BMI 50.94 49.76 50.65 49.92 50.42 50.56 51.59   Foot/eye exam completion dates 07/12/2015 06/09/2014  Foot Form Completion Done Done   BP/Weight 07/12/2015 05/31/2015 05/06/2015 04/26/2015 04/05/2015 01/08/3334 02/16/6255  Systolic BP 389 373 428 768 115 726 -  Diastolic BP 80 78 76 60 74 74 -  Wt. (Lbs) 306.12 299 304.4 300 303 303.8 310  BMI 50.94 49.76 50.65 49.92 50.42 50.56 51.59   Foot/eye exam completion dates 07/12/2015 06/09/2014  Foot Form Completion Done Done

## 2015-08-08 NOTE — Assessment & Plan Note (Signed)
Controlled, no change in medication DASH diet and commitment to daily physical activity for a minimum of 30 minutes discussed and encouraged, as a part of hypertension management. The importance of attaining a healthy weight is also discussed.  BP/Weight 07/12/2015 05/31/2015 05/06/2015 04/26/2015 04/05/2015 6/80/8811 0/01/1593  Systolic BP 585 929 244 628 638 177 -  Diastolic BP 80 78 76 60 74 74 -  Wt. (Lbs) 306.12 299 304.4 300 303 303.8 310  BMI 50.94 49.76 50.65 49.92 50.42 50.56 51.59

## 2015-08-08 NOTE — Assessment & Plan Note (Signed)
Bilateral lower extremity numbness, likely due both to diabetic neuropathy as well as arthritis and disc disease causing nerve irritation. Start  Gabapentin at bedtime and referred to neurologist for further evaluation for specific nerve damage

## 2015-08-09 ENCOUNTER — Ambulatory Visit (INDEPENDENT_AMBULATORY_CARE_PROVIDER_SITE_OTHER): Payer: 59 | Admitting: Family Medicine

## 2015-08-09 ENCOUNTER — Encounter: Payer: Self-pay | Admitting: Family Medicine

## 2015-08-09 VITALS — BP 132/74 | HR 93 | Resp 18 | Ht 65.0 in | Wt 307.0 lb

## 2015-08-09 DIAGNOSIS — E1122 Type 2 diabetes mellitus with diabetic chronic kidney disease: Secondary | ICD-10-CM | POA: Diagnosis not present

## 2015-08-09 DIAGNOSIS — G5601 Carpal tunnel syndrome, right upper limb: Secondary | ICD-10-CM

## 2015-08-09 DIAGNOSIS — G5602 Carpal tunnel syndrome, left upper limb: Secondary | ICD-10-CM

## 2015-08-09 DIAGNOSIS — G4730 Sleep apnea, unspecified: Secondary | ICD-10-CM

## 2015-08-09 DIAGNOSIS — E611 Iron deficiency: Secondary | ICD-10-CM | POA: Diagnosis not present

## 2015-08-09 DIAGNOSIS — R208 Other disturbances of skin sensation: Secondary | ICD-10-CM | POA: Diagnosis not present

## 2015-08-09 DIAGNOSIS — N189 Chronic kidney disease, unspecified: Secondary | ICD-10-CM

## 2015-08-09 DIAGNOSIS — I10 Essential (primary) hypertension: Secondary | ICD-10-CM | POA: Diagnosis not present

## 2015-08-09 DIAGNOSIS — G5603 Carpal tunnel syndrome, bilateral upper limbs: Secondary | ICD-10-CM

## 2015-08-09 DIAGNOSIS — R200 Anesthesia of skin: Secondary | ICD-10-CM

## 2015-08-09 MED ORDER — GABAPENTIN 100 MG PO CAPS: 100.0000 mg | ORAL_CAPSULE | Freq: Three times a day (TID) | ORAL | Status: DC

## 2015-08-09 NOTE — Assessment & Plan Note (Signed)
Upcoming appt with ortho

## 2015-08-09 NOTE — Progress Notes (Signed)
Subjective:    [name], female    DOB: 04-Apr-1961, 54 y.o.   MRN: [id number]  HPI   Kendra Hanson     MRN: [id number]      DOB: Oct 10, 1961   HPI [name] is here for follow up and re-evaluation of chronic medical conditions, medication management and review of any available recent lab and radiology data.  Preventive health is updated, specifically  Cancer screening and Immunization.   Questions or concerns regarding consultations or procedures which the PT has had in the interim are  Addressed.Happy with colonoscopy and gyne consult. Has upcoming hand and foot evaluations The PT denies any adverse reactions to current medications since the last visit.  There are no new concerns.  There are no specific complaints   ROS Denies recent fever or chills. Denies sinus pressure, nasal congestion, ear pain or sore throat. Denies chest congestion, productive cough or wheezing. Denies chest pains, palpitations and leg swelling Denies abdominal pain, nausea, vomiting,diarrhea or constipation.   Denies dysuria, frequency, hesitancy or incontinence. Denies joint pain, swelling and limitation in mobility. Denies headaches, seizures, c/o numbness and burning pain in feet, some relief with gabapentin wants to increase the dose. Denies depression, anxiety or insomnia. Denies skin break down or rash.   PE  BP 132/74 mmHg  Pulse 93  Resp 18  Ht 5\' 5"  (7.673 m)  Wt 307 lb (139.254 kg)  BMI 51.09 kg/m2  SpO2 95%  Patient alert and oriented and in no cardiopulmonary distress.  HEENT: No facial asymmetry, EOMI,   oropharynx pink and moist.  Neck supple no JVD, no mass.  Chest: Clear to auscultation bilaterally.  CVS: S1, S2 no murmurs, no S3.Regular rate.  ABD: Soft non tender.   Ext: No edema  MS: Adequate ROM spine, shoulders, hips and knees.  Skin: Intact, no ulcerations or rash noted.  Psych: Good eye contact, normal affect. Memory intact not anxious or  depressed appearing.  CNS: CN 2-12 intact, power,  normal throughout.no focal deficits noted.   Assessment & Plan   Bilateral leg numbness Some response to burning pain with gabapentin 100 mg dose will titrate up as needed / tolerated to 300 mg at bedtime  Type II diabetes mellitus with renal manifestations Improved, pt applauded on this. No med change. Needs to start exercise [name] is reminded of the importance of commitment to daily physical activity for 30 minutes or more, as able and the need to limit carbohydrate intake to 30 to 60 grams per meal to help with blood sugar control.   The need to take medication as prescribed, test blood sugar as directed, and to call between visits if there is a concern that blood sugar is uncontrolled is also discussed.   [name] is reminded of the importance of daily foot exam, annual eye examination, and good blood sugar, blood pressure and cholesterol control.  Diabetic Labs Latest Ref Rng 08/06/2015 04/22/2015 01/27/2015 09/16/2014 05/30/2014  HbA1c <5.7 % 7.4(H) 8.0(H) 8.5(H) 7.6(H) 7.8(H)  Microalbumin <2.0 mg/dL - - 24.5(H) - -  Micro/Creat Ratio 0.0 - 30.0 mg/g - - 160.3(H) - -  Chol 125 - 200 mg/dL 146 - 140 - 145  HDL >=46 mg/dL 48 - 48 - 44  Calc LDL <130 mg/dL 84 - 76 - 90  Triglycerides <150 mg/dL 72 - 78 - 55  Creatinine 0.50 - 1.05 mg/dL 1.03 1.09 0.85 0.97 0.98   BP/Weight 08/09/2015 07/12/2015 05/31/2015 05/06/2015 04/26/2015  04/05/2015 02/19/8118  Systolic BP 147 829 562 130 865 784 696  Diastolic BP 74 80 78 76 60 74 74  Wt. (Lbs) 307 306.12 299 304.4 300 303 303.8  BMI 51.09 50.94 49.76 50.65 49.92 50.42 50.56   Foot/eye exam completion dates 07/12/2015 06/09/2014  Foot Form Completion Done Done         Essential hypertension Controlled, no change in medication DASH diet and commitment to daily physical activity for a minimum of 30 minutes discussed and encouraged, as a part of hypertension management. The  importance of attaining a healthy weight is also discussed.  BP/Weight 08/09/2015 07/12/2015 05/31/2015 05/06/2015 04/26/2015 04/05/2015 2/95/2841  Systolic BP 324 401 027 253 664 403 474  Diastolic BP 74 80 78 76 60 74 74  Wt. (Lbs) 307 306.12 299 304.4 300 303 303.8  BMI 51.09 50.94 49.76 50.65 49.92 50.42 50.56        Sleep apnea Improved sleep and BP control, less fatigue , importance of ongoing compliance is stressed  Iron deficiency Slight improvement in iron, advised increase iron to twice daily, if on rept still has low iron stores may need IV iron and hematology consult  Carpal tunnel syndrome Upcoming appt with ortho  Morbid obesity Unchanged Patient re-educated about  the importance of commitment to a  minimum of 150 minutes of exercise per week.  The importance of healthy food choices with portion control discussed. Encouraged to start a food diary, count calories and to consider  joining a support group. Sample diet sheets offered. Goals set by the patient for the next several months.   Weight /BMI 08/09/2015 07/12/2015 05/31/2015  WEIGHT 307 lb 306 lb 1.9 oz 299 lb  HEIGHT 5\' 5"  5\' 5"  5\' 5"   BMI 51.09 kg/m2 50.94 kg/m2 49.76 kg/m2    Current exercise per week 60 minutes.        Review of Systems     Objective:   Physical Exam        Assessment & Plan:

## 2015-08-09 NOTE — Patient Instructions (Addendum)
F/u in 3 month, call if you need me before   CONGRATS on excellent improvement in your blood sugar  It is important that you exercise regularly at least 30 minutes 5 times a week. If you develop chest pain, have severe difficulty breathing, or feel very tired, stop exercising immediately and seek medical attention    New for management of pain due to nerve irritation is gabapentin 17m.  Start  two capsules at bedtime, and after an additional 5 days to 3 capsules at bedtime if needed , for pain control.  Please note, the script is written as one three times daily, DO NOT take like this, instead take only at bedtime as above.  Vit D and hep C screen will be added  HBA1C chem 7 and EGFR ,CBc and iron and ferritin in 3 month,   Increase iron to one twice daily

## 2015-08-09 NOTE — Assessment & Plan Note (Signed)
Improved sleep and BP control, less fatigue , importance of ongoing compliance is stressed

## 2015-08-09 NOTE — Assessment & Plan Note (Signed)
Improved, pt applauded on this. No med change. Needs to start exercise Kendra Hanson is reminded of the importance of commitment to daily physical activity for 30 minutes or more, as able and the need to limit carbohydrate intake to 30 to 60 grams per meal to help with blood sugar control.   The need to take medication as prescribed, test blood sugar as directed, and to call between visits if there is a concern that blood sugar is uncontrolled is also discussed.   Kendra Hanson is reminded of the importance of daily foot exam, annual eye examination, and good blood sugar, blood pressure and cholesterol control.  Diabetic Labs Latest Ref Rng 08/06/2015 04/22/2015 01/27/2015 09/16/2014 05/30/2014  HbA1c <5.7 % 7.4(H) 8.0(H) 8.5(H) 7.6(H) 7.8(H)  Microalbumin <2.0 mg/dL - - 24.5(H) - -  Micro/Creat Ratio 0.0 - 30.0 mg/g - - 160.3(H) - -  Chol 125 - 200 mg/dL 146 - 140 - 145  HDL >=46 mg/dL 48 - 48 - 44  Calc LDL <130 mg/dL 84 - 76 - 90  Triglycerides <150 mg/dL 72 - 78 - 55  Creatinine 0.50 - 1.05 mg/dL 1.03 1.09 0.85 0.97 0.98   BP/Weight 08/09/2015 07/12/2015 05/31/2015 05/06/2015 04/26/2015 04/05/2015 8/93/8101  Systolic BP 751 025 852 778 242 353 614  Diastolic BP 74 80 78 76 60 74 74  Wt. (Lbs) 307 306.12 299 304.4 300 303 303.8  BMI 51.09 50.94 49.76 50.65 49.92 50.42 50.56   Foot/eye exam completion dates 07/12/2015 06/09/2014  Foot Form Completion Done Done

## 2015-08-09 NOTE — Assessment & Plan Note (Signed)
Some response to burning pain with gabapentin 100 mg dose will titrate up as needed / tolerated to 300 mg at bedtime

## 2015-08-09 NOTE — Assessment & Plan Note (Signed)
Unchanged Patient re-educated about  the importance of commitment to a  minimum of 150 minutes of exercise per week.  The importance of healthy food choices with portion control discussed. Encouraged to start a food diary, count calories and to consider  joining a support group. Sample diet sheets offered. Goals set by the patient for the next several months.   Weight /BMI 08/09/2015 07/12/2015 05/31/2015  WEIGHT 307 lb 306 lb 1.9 oz 299 lb  HEIGHT 5\' 5"  5\' 5"  5\' 5"   BMI 51.09 kg/m2 50.94 kg/m2 49.76 kg/m2    Current exercise per week 60 minutes.

## 2015-08-09 NOTE — Assessment & Plan Note (Signed)
Controlled, no change in medication DASH diet and commitment to daily physical activity for a minimum of 30 minutes discussed and encouraged, as a part of hypertension management. The importance of attaining a healthy weight is also discussed.  BP/Weight 08/09/2015 07/12/2015 05/31/2015 05/06/2015 04/26/2015 04/05/2015 4/94/4967  Systolic BP 591 638 466 599 357 017 793  Diastolic BP 74 80 78 76 60 74 74  Wt. (Lbs) 307 306.12 299 304.4 300 303 303.8  BMI 51.09 50.94 49.76 50.65 49.92 50.42 50.56

## 2015-08-09 NOTE — Assessment & Plan Note (Signed)
Slight improvement in iron, advised increase iron to twice daily, if on rept still has low iron stores may need IV iron and hematology consult

## 2015-08-10 ENCOUNTER — Ambulatory Visit (INDEPENDENT_AMBULATORY_CARE_PROVIDER_SITE_OTHER): Payer: 59 | Admitting: Podiatry

## 2015-08-10 DIAGNOSIS — Q828 Other specified congenital malformations of skin: Secondary | ICD-10-CM

## 2015-08-10 DIAGNOSIS — B351 Tinea unguium: Secondary | ICD-10-CM

## 2015-08-10 DIAGNOSIS — M79673 Pain in unspecified foot: Secondary | ICD-10-CM | POA: Diagnosis not present

## 2015-08-10 LAB — HEPATITIS C ANTIBODY: HCV Ab: NEGATIVE

## 2015-08-10 NOTE — Progress Notes (Signed)
[name], female   DOB: [birth year], 54 y.o.   MRN: [id number] Complaint:  Visit Type: Patient returns to my office for continued preventative foot care services. Complaint: Patient states" my nails have grown long and thick and become painful to walk and wear shoes" Patient has been diagnosed with DM with no foot complications. The patient presents for preventative foot care services. No changes to ROS.  She is taking gabapentin for her neuropathy.  Podiatric Exam: Vascular: dorsalis pedis and posterior tibial pulses are palpable bilateral. Capillary return is immediate. Temperature gradient is WNL. Skin turgor WNL  Sensorium: Normal Semmes Weinstein monofilament test. Normal tactile sensation bilaterally. Nail Exam: Pt has thick disfigured discolored nails with subungual debris noted bilateral entire nail hallux through fifth toenails Ulcer Exam: There is no evidence of ulcer or pre-ulcerative changes or infection. Orthopedic Exam: Muscle tone and strength are WNL. No limitations in general ROM. No crepitus or effusions noted. Foot type and digits show no abnormalities. Bony prominences are unremarkable. Skin:  Porokeratosis sub 5th B/L.Marland Kitchen No infection or ulcers  Diagnosis:  Onychomycosis, , Pain in right toe, pain in left toes  Treatment & Plan Procedures and Treatment: Consent by patient was obtained for treatment procedures. The patient understood the discussion of treatment and procedures well. All questions were answered thoroughly reviewed. Debridement of mycotic and hypertrophic toenails, 1 through 5 bilateral and clearing of subungual debris. No ulceration, no infection noted.  Debride porokeratosis B/L Return Visit-Office Procedure: Patient instructed to return to the office for a follow up visit 3 months for continued evaluation and treatment.

## 2015-08-11 LAB — VITAMIN D 25 HYDROXY (VIT D DEFICIENCY, FRACTURES): Vit D, 25-Hydroxy: 43 ng/mL (ref 30–100)

## 2015-08-24 ENCOUNTER — Other Ambulatory Visit: Payer: Self-pay | Admitting: Family Medicine

## 2015-08-27 ENCOUNTER — Encounter: Payer: 59 | Attending: Family Medicine | Admitting: Nutrition

## 2015-08-27 ENCOUNTER — Encounter: Payer: Self-pay | Admitting: Nutrition

## 2015-08-27 VITALS — Ht 65.0 in | Wt 305.0 lb

## 2015-08-27 DIAGNOSIS — E118 Type 2 diabetes mellitus with unspecified complications: Principal | ICD-10-CM

## 2015-08-27 DIAGNOSIS — E1165 Type 2 diabetes mellitus with hyperglycemia: Secondary | ICD-10-CM

## 2015-08-27 DIAGNOSIS — Z794 Long term (current) use of insulin: Principal | ICD-10-CM

## 2015-08-27 DIAGNOSIS — IMO0002 Reserved for concepts with insufficient information to code with codable children: Secondary | ICD-10-CM

## 2015-08-27 NOTE — Progress Notes (Signed)
  Medical Nutrition Therapy:  Appt start time: 4696 end time:  1630.   Assessment:  Primary concerns today: diabetes. Lives with her husband. She does the shopping and cooking. Most foods are baked, broiled and fried. Has cut back on the frying  Most recent  A1C 7.4%. Down from 8%.  Testing in am. FBS: 79 mg/dl. Trying to cut back on portions. Lantus 60 units daily, Novolog with meals and Metformin. Still drinking cokes and sweetened beverages. Working on cutting them out. Has been snacking and this is causing increasing weight and elevated blood sugars. Physical activy: Walk some. Has knee problem and feet issues.  Preferred Learning Style:   Visual  Learning Readiness:  Ready  Change in progress  MEDICATIONS: See list.   DIETARY INTAKE:  24-hr recall:  B ( AM): Banana, OJ Snk ( AM):   L ( PM): Bologna sandwich and peach cobbler, Ginger ale 8 oz Snk ( PM): pb and crackers. D ( PM): Hamburger, fries, Coke Snk ( PM): none:  Vanilla waferf or gram crackers Beverages: water, soda  Usual physical activity:  Walks some   Estimated energy needs: 1500 calories 170 g carbohydrates 112 g protein 42 g fat  Progress Towards Goal(s):  In progress.   Nutritional Diagnosis:  NB-1.1 Food and nutrition-related knowledge deficit As related to Diabetes.  As evidenced by A1C 7.4%.    Intervention:  Nutrition and Diabetes education provided on My Plate, CHO counting, meal planning, portion sizes, timing of meals, avoiding snacks between meals unless having a low blood sugar, target ranges for A1C and blood sugars, signs/symptoms and treatment of hyper/hypoglycemia, monitoring blood sugars, taking medications as prescribed, benefits of exercising 30 minutes per day and prevention of complications of DM.  Goals: 1. Follow My Plate. 2. Cut out sodas, juices, tea and diet sodas. 3. Increase fresh fruits and vegetables. 4. Increase water intake with meals. 5. Take medications as  prescribed. 6. Eat three meals per day on time. 7. Exercise 30 minutes 5 days per week. 8. Get A1C down to 7% or less.  Teaching Method Utilized:  Visual Auditory Hands on  Handouts given during visit include:  The Plate Method  Meal Plan Card  Diabetes Instructions  Barriers to learning/adherence to lifestyle change: None  Demonstrated degree of understanding via:  Teach Back   Monitoring/Evaluation:  Dietary intake, exercise, meal planning, SBG, and body weight in 1 month(s).

## 2015-08-27 NOTE — Patient Instructions (Addendum)
Goals 1.Follow the Plate Method 2. Inject insulin BEFORE meals instead of after meals. 3. Avoid snacks between meals. 4 Walk 20 minutes daily. 5. Lose 1 lb per week. 6. Get A1C  To 7% in three months. 7. Increase fresh fruits and vegetables. 8. Cut out soda, juice and sodas.

## 2015-09-14 ENCOUNTER — Encounter: Payer: Self-pay | Admitting: Family Medicine

## 2015-09-20 ENCOUNTER — Encounter: Payer: Self-pay | Admitting: Vascular Surgery

## 2015-09-22 ENCOUNTER — Ambulatory Visit (HOSPITAL_COMMUNITY)
Admission: RE | Admit: 2015-09-22 | Discharge: 2015-09-22 | Disposition: A | Discharge status: Home / Self Care | Payer: 59 | Source: Ambulatory Visit | Attending: Vascular Surgery | Admitting: Vascular Surgery

## 2015-09-22 ENCOUNTER — Encounter: Payer: Self-pay | Admitting: Vascular Surgery

## 2015-09-22 ENCOUNTER — Ambulatory Visit (INDEPENDENT_AMBULATORY_CARE_PROVIDER_SITE_OTHER): Payer: 59 | Admitting: Vascular Surgery

## 2015-09-22 VITALS — BP 146/84 | HR 88 | Temp 99.0°F | Resp 16 | Ht 64.5 in | Wt 309.0 lb

## 2015-09-22 DIAGNOSIS — I872 Venous insufficiency (chronic) (peripheral): Secondary | ICD-10-CM

## 2015-09-22 DIAGNOSIS — I83893 Varicose veins of bilateral lower extremities with other complications: Secondary | ICD-10-CM | POA: Insufficient documentation

## 2015-09-22 NOTE — Progress Notes (Signed)
Filed Vitals:   09/22/15 0904 09/22/15 0911  BP: 144/81 146/84  Pulse: 87 88  Temp: 99 F (37.2 C)   TempSrc: Oral   Resp: 16   Height: 5' 4.5" (1.638 m)   Weight: 309 lb (140.161 kg)   SpO2: 97%

## 2015-09-22 NOTE — Progress Notes (Signed)
Vascular and Vein Specialist of [facility]  Patient name: Kendra Hanson MRN: [id number] DOB: 1961/03/25 Sex: female  REASON FOR CONSULT: Leg swelling and varicose veins. Referred by Dr. Moshe Cipro  HPI: Kendra Hanson is a 54 y.o. female, who hhas had bilateral lower extremity swelling since this summer. This came on gradually. Symptoms are the same on both sides. She notes some aching pain when she standing for a prolonged period of time. 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 She is unaware of any previous history of DVT or phlebitis. She has had no previous abdominal or inguinal surgery or radiation therapy. She does note some varicose veins in the lateral aspect of both thighs.  I have reviewed the records that were sent from [facility]. She has a history of morbid obesity. Dr. Moshe Cipro has discussed with her the importance of exercise and nutrition.  Past Medical History  Diagnosis Date  . Insulin dependent diabetes mellitus ([place]) 1996  . Hypertension 2008    Normal CBC and CMet in 2012; negative stress nuclear in 2006- patient asymptomatic  . Obesity   . Hyperlipidemia 2008    Lipid profile in 04/2011:136, 53, 43  . Multiple allergies     perennial  . Fibroids     Uterine  . PONV (postoperative nausea and vomiting)   . Sleep apnea     CPAP  . GERD (gastroesophageal reflux disease)   . Anemia     Family History  Problem Relation Age of Onset  . Hypertension Mother   . Diabetes Mother   . Kidney failure Mother   . Stroke Mother   . Kidney failure Father   . Diabetes Father   . Heart attack Father   . COPD Sister   . Diabetes Brother   . Diabetes Brother   . Diabetes Brother   . Diabetes Brother   . Diabetes Brother   . Hypertension Brother   She does have a family history of premature cardiovascular disease.  SOCIAL HISTORY: Social History   Social History  . Marital Status: Married    Spouse Name: N/A  .  Number of Children: N/A  . Years of Education: N/A   Occupational History  . Employed [employer] History Main Topics  . Smoking status: Never Smoker   . Smokeless tobacco: Never Used  . Alcohol Use: No  . Drug Use: No  . Sexual Activity: Yes    Birth Control/ Protection: None, Implant   Other Topics Concern  . Not on file   Social History Narrative   Married with 2 children    No Known Allergies  Current Outpatient Prescriptions  Medication Sig Dispense Refill  . betamethasone valerate ointment (VALISONE) 0.1 % APPLY TOPICALLY 2 (TWO) TIMES DAILY 45 g 5  . CRESTOR 10 MG tablet TAKE 1 TABLET BY MOUTH ONCE DAILY AT BEDTIME 90 tablet 3  . diltiazem (TAZTIA XT) 360 MG 24 hr capsule Take 1 capsule (360 mg total) by mouth daily. 90 capsule 1  . ergocalciferol (VITAMIN D2) 50000 UNITS capsule Take 1 capsule (50,000 Units total) by mouth once a week. One capsule once weekly 12 capsule 3  . estradiol (ESTRACE) 0.1 MG/GM vaginal cream Place 1 Applicatorful vaginally 3 (three) times a week.    . estradiol (VIVELLE-DOT) 0.0375 MG/24HR   12  . exenatide (BYETTA) 10 MCG/0.04ML SOPN injection Inject 0.04 mLs (10 mcg total) into the skin 2 (  two) times daily with a meal. 3 pen 3  . fluticasone (FLONASE) 50 MCG/ACT nasal spray Place 2 sprays into both nostrils daily. 48 g 1  . furosemide (LASIX) 20 MG tablet Take 1 tablet (20 mg total) by mouth 3 (three) times daily. 270 tablet 1  . gabapentin (NEURONTIN) 100 MG capsule Take 1 capsule (100 mg total) by mouth 3 (three) times daily. 90 capsule 1  . insulin aspart (NOVOLOG FLEXPEN) 100 UNIT/ML FlexPen 12 units in the morning, 10 units at lunchtime , and 12 units at bedtime 15 mL 11  . Lancet Device MISC Use with verio IQ meter three times daily to test blood sugar  100 each PRN  . LANTUS SOLOSTAR 100 UNIT/ML Solostar Pen INJECT 60 UNITS UNDER THE SKIN DAILY 60 mL 12  . LANTUS SOLOSTAR 100 UNIT/ML Solostar Pen INJECT 60 UNITS UNDER THE SKIN  DAILY 60 mL 12  . loratadine (CLARITIN) 10 MG tablet Take 10 mg by mouth daily.    Marland Kitchen losartan (COZAAR) 100 MG tablet TAKE 1 TABLET BY MOUTH DAILY. 90 tablet 1  . metFORMIN (GLUCOPHAGE) 1000 MG tablet TAKE 1 TABLET BY MOUTH 2 TIMES DAILY WITH A MEAL 180 tablet 1  . pantoprazole (PROTONIX) 20 MG tablet TAKE 1 TABLET BY MOUTH EVERY DAY 90 tablet 1  . potassium chloride (K-DUR,KLOR-CON) 10 MEQ tablet Take 1 tablet (10 mEq total) by mouth 3 (three) times daily. 270 tablet 1  . UNIFINE PENTIPS 31G X 8 MM MISC USE AS DIRECTED 100 each 3   No current facility-administered medications for this visit.    REVIEW OF SYSTEMS:  [X]  denotes positive finding, [ ]  denotes negative finding Cardiac  Comments:  Chest pain or chest pressure:    Shortness of breath upon exertion:    Short of breath when lying flat:    Irregular heart rhythm:        Vascular    Pain in calf, thigh, or hip brought on by ambulation:    Pain in feet at night that wakes you up from your sleep:     Blood clot in your veins:    Leg swelling:  X       Pulmonary    Oxygen at home:    Productive cough:     Wheezing:         Neurologic    Sudden weakness in arms or legs:     Sudden numbness in arms or legs:     Sudden onset of difficulty speaking or slurred speech:    Temporary loss of vision in one eye:     Problems with dizziness:         Gastrointestinal    Blood in stool:     Vomited blood:         Genitourinary    Burning when urinating:     Blood in urine:        Psychiatric    Major depression:         Hematologic    Bleeding problems:    Problems with blood clotting too easily:        Skin    Rashes or ulcers:        Constitutional    Fever or chills:      PHYSICAL EXAM: There were no vitals filed for this visit.  GENERAL: The patient is a well-nourished female, in no acute distress. The vital signs are documented above. CARDIAC: There is a regular rate and rhythm.  VASCULAR: I do not detect  carotid bruits. She has palpable dorsalis pedis pulses bilaterally. She has mild bilateral lower extremity swelling. PULMONARY: There is good air exchange bilaterally without wheezing or rales. ABDOMEN: Soft and non-tender with normal pitched bowel sounds.  MUSCULOSKELETAL: There are no major deformities or cyanosis. NEUROLOGIC: No focal weakness or paresthesias are detected. SKIN: She has some telangiectasias on the lateral aspect of both thighs. PSYCHIATRIC: The patient has a normal affect.  DATA:  I have independently interpreted her venous duplex scan which shows no evidence of DVT bilaterally. There is no deep vein reflux bilaterally and no reflux in the great saphenous veins or short saphenous vein.  MEDICAL ISSUES:  BILATERAL LOWER EXTREMITY SWELLING: I reassured her that she has excellent arterial flow with palpable pedal pulses. I also reviewed her venous duplex scan with her. She has no evidence of significant venous disease. Her exam does not suggest lymphedema. I think she is experiencing swelling in both lower extremities from standing for a prolonged period of time at work. We have discussed the importance of intermittent leg elevation in the proper positioning for this. I have written her a prescription for a pantyhose style compression stocking with a gradient of 15-20 mmHg. I have encouraged her to stay as active as possible and to try to avoid prolonged sitting and standing. I'll be happy to see her back at anytime if any new vascular issues arise.   Deitra Mayo [facility]ists of [place]: [phone]

## 2015-09-27 ENCOUNTER — Other Ambulatory Visit: Payer: Self-pay | Admitting: Family Medicine

## 2015-09-28 ENCOUNTER — Telehealth: Payer: Self-pay | Admitting: Family Medicine

## 2015-09-28 NOTE — Telephone Encounter (Signed)
Patient aware.

## 2015-09-28 NOTE — Telephone Encounter (Signed)
Continue current , may take sudafed one daily for next 3 to 5 days, also tylenol 500 mg up to 3 daily for headache, ensure adequate fluid intake, , may add netty pot also

## 2015-09-28 NOTE — Telephone Encounter (Signed)
Patient is having sinus draining,coughing, headache, please advise?

## 2015-09-28 NOTE — Telephone Encounter (Signed)
Patient has clear sinus drainage.   Symptoms x 3 days.   Did have fever and chills x 24 hours which has now stopped.   C/o mostly hoarseness and headache with sinus drainage.   Is taking Tessalon Perles, Zyrtec, and Flonase nasal spray.  Please advise.

## 2015-09-29 ENCOUNTER — Telehealth: Payer: Self-pay | Admitting: Family Medicine

## 2015-09-29 MED ORDER — PROMETHAZINE-DM 6.25-15 MG/5ML PO SYRP: 5.0000 mL | ORAL_SOLUTION | Freq: Every evening | ORAL | Status: DC | PRN

## 2015-09-29 NOTE — Addendum Note (Signed)
Addended by: Denman George B on: 09/29/2015 09:53 AM   Modules accepted: Orders

## 2015-09-29 NOTE — Telephone Encounter (Signed)
Kendra Hanson states that the Tessalon Pearls is not working for her cough, she is asking for something else, please advise?

## 2015-09-29 NOTE — Telephone Encounter (Signed)
Please advise 

## 2015-09-29 NOTE — Telephone Encounter (Signed)
pls let her know and send in phenergan DM one teaspoon at bedtime as needed x 120 cc no refill

## 2015-09-29 NOTE — Telephone Encounter (Signed)
Medication sent to pharmacy, called and left voicemail for patient notifying.

## 2015-10-25 ENCOUNTER — Other Ambulatory Visit: Payer: Self-pay | Admitting: Family Medicine

## 2015-10-27 ENCOUNTER — Other Ambulatory Visit: Payer: Self-pay

## 2015-10-27 ENCOUNTER — Ambulatory Visit: Payer: 59 | Admitting: *Deleted

## 2015-10-27 MED ORDER — EXENATIDE 10 MCG/0.04ML ~~LOC~~ SOPN: 10.0000 ug | PEN_INJECTOR | Freq: Two times a day (BID) | SUBCUTANEOUS | Status: DC

## 2015-11-09 ENCOUNTER — Ambulatory Visit: Payer: 59 | Admitting: Family Medicine

## 2015-11-09 ENCOUNTER — Telehealth: Payer: Self-pay | Admitting: Family Medicine

## 2015-11-09 ENCOUNTER — Other Ambulatory Visit: Payer: Self-pay | Admitting: Family Medicine

## 2015-11-09 DIAGNOSIS — Z1231 Encounter for screening mammogram for malignant neoplasm of breast: Secondary | ICD-10-CM

## 2015-11-09 NOTE — Telephone Encounter (Signed)
Labs faxed

## 2015-11-09 NOTE — Telephone Encounter (Signed)
Patient is asking if we can send her lab orders to the lab

## 2015-11-16 ENCOUNTER — Telehealth: Payer: Self-pay | Admitting: Family Medicine

## 2015-11-16 NOTE — Telephone Encounter (Signed)
Patient has questions regarding her lab orders, please advise?

## 2015-11-16 NOTE — Telephone Encounter (Signed)
Called patient and left message for them to return call at the office   

## 2015-11-17 ENCOUNTER — Other Ambulatory Visit: Payer: Self-pay | Admitting: Family Medicine

## 2015-11-17 MED FILL — LOSARTAN POTASSIUM 100 MG T: 100 | 90 days supply | Qty: 90 | Fill #1

## 2015-11-17 NOTE — Telephone Encounter (Signed)
Lab order mailed.

## 2015-11-18 MED FILL — PANTOPRAZOLE SOD DR 20 MG T: 20 | 90 days supply | Qty: 90 | Fill #0

## 2015-11-18 MED FILL — metFORMIN HCL 1000 MG TABS: 1000 | 90 days supply | Qty: 180 | Fill #0

## 2015-11-19 ENCOUNTER — Ambulatory Visit (HOSPITAL_COMMUNITY)
Admission: RE | Admit: 2015-11-19 | Discharge: 2015-11-19 | Disposition: A | Discharge status: Home / Self Care | Payer: 59 | Source: Ambulatory Visit | Attending: Family Medicine | Admitting: Family Medicine

## 2015-11-19 DIAGNOSIS — Z1231 Encounter for screening mammogram for malignant neoplasm of breast: Secondary | ICD-10-CM | POA: Diagnosis not present

## 2015-11-23 ENCOUNTER — Ambulatory Visit: Payer: 59 | Admitting: Podiatry

## 2015-11-24 MED FILL — ESTRADIOL PATCH 0.0375: 0.0375 | 84 days supply | Qty: 24 | Fill #6

## 2015-11-29 ENCOUNTER — Ambulatory Visit: Payer: 59 | Admitting: Nutrition

## 2015-11-29 DIAGNOSIS — E611 Iron deficiency: Secondary | ICD-10-CM | POA: Diagnosis not present

## 2015-11-29 DIAGNOSIS — N189 Chronic kidney disease, unspecified: Secondary | ICD-10-CM | POA: Diagnosis not present

## 2015-11-29 DIAGNOSIS — E1129 Type 2 diabetes mellitus with other diabetic kidney complication: Secondary | ICD-10-CM | POA: Diagnosis not present

## 2015-11-29 DIAGNOSIS — D509 Iron deficiency anemia, unspecified: Secondary | ICD-10-CM | POA: Diagnosis not present

## 2015-11-29 DIAGNOSIS — E1122 Type 2 diabetes mellitus with diabetic chronic kidney disease: Secondary | ICD-10-CM | POA: Diagnosis not present

## 2015-11-30 ENCOUNTER — Encounter: Payer: Self-pay | Admitting: Family Medicine

## 2015-11-30 LAB — FERRITIN: Ferritin: 73 ng/mL (ref 10–291)

## 2015-11-30 LAB — CBC
HCT: 35.7 % — ABNORMAL LOW (ref 36.0–46.0)
Hemoglobin: 11.4 g/dL — ABNORMAL LOW (ref 12.0–15.0)
MCH: 27.7 pg (ref 26.0–34.0)
MCHC: 31.9 g/dL (ref 30.0–36.0)
MCV: 86.9 fL (ref 78.0–100.0)
MPV: 9.8 fL (ref 8.6–12.4)
Platelets: 330 10*3/uL (ref 150–400)
RBC: 4.11 MIL/uL (ref 3.87–5.11)
RDW: 14.4 % (ref 11.5–15.5)
WBC: 9.5 10*3/uL (ref 4.0–10.5)

## 2015-11-30 LAB — BASIC METABOLIC PANEL WITH GFR
BUN: 15 mg/dL (ref 7–25)
CO2: 31 mmol/L (ref 20–31)
Calcium: 9.3 mg/dL (ref 8.6–10.4)
Chloride: 102 mmol/L (ref 98–110)
Creat: 1.03 mg/dL (ref 0.50–1.05)
GFR, Est African American: 71 mL/min (ref >=60)
GFR, Est Non African American: 61 mL/min (ref >=60)
Glucose, Bld: 86 mg/dL (ref 65–99)
Potassium: 4.6 mmol/L (ref 3.5–5.3)
Sodium: 139 mmol/L (ref 135–146)

## 2015-11-30 LAB — HEMOGLOBIN A1C
Hgb A1c MFr Bld: 7.6 % — ABNORMAL HIGH (ref <5.7)
Mean Plasma Glucose: 171 mg/dL — ABNORMAL HIGH (ref <117)

## 2015-11-30 LAB — IRON: Iron: 38 ug/dL — ABNORMAL LOW (ref 45–160)

## 2015-12-01 ENCOUNTER — Other Ambulatory Visit: Payer: Self-pay | Admitting: Family Medicine

## 2015-12-01 MED FILL — GABAPENTIN 100 MG CAPSULE: 100 | 30 days supply | Qty: 90 | Fill #0

## 2015-12-08 ENCOUNTER — Encounter: Payer: Self-pay | Admitting: *Deleted

## 2015-12-08 ENCOUNTER — Other Ambulatory Visit: Payer: Self-pay | Admitting: *Deleted

## 2015-12-09 ENCOUNTER — Encounter: Payer: Self-pay | Admitting: *Deleted

## 2015-12-09 NOTE — Patient Outreach (Signed)
[facility]) Care Management   12/08/15  Kendra Hanson 05-19-1961 [id number]  Kendra Hanson is an 55 y.o. female who presents to the [place] Management office for routine Link To Wellness follow up for self management assistance with Type II DM, HTN and hyperlipidemia.  Subjective:  Kendra Hanson says she has been troubled with bilateral wrist pain and was diagnosed with carpel tunnel syndrome and will eventually require surgery on both wrists. She says she also has pain in her legs, feet and knees and will see an orthopedist next month. She says she takes gabapentin to help her foot pain.  She says she checks her blood sugar in the morning and she reports a variance of 45-210. She reports occasional lows in the morning and says she takes her last dose of novolog at bedtime with a snack. She is very frustrated with her weight gain and says her current weight is the most she has ever weighed.  Objective:   Review of Systems  Constitutional: Negative.     Physical Exam  Constitutional: She is oriented to person, place, and time. She appears well-developed and well-nourished.  Respiratory: Effort normal.  Neurological: She is alert and oriented to person, place, and time.  Psychiatric: She has a normal mood and affect. Her behavior is normal. Judgment and thought content normal.    Current Medications:   Current Outpatient Prescriptions  Medication Sig Dispense Refill  . CRESTOR 10 MG tablet TAKE 1 TABLET BY MOUTH ONCE DAILY AT BEDTIME 90 tablet 3  . diltiazem (TAZTIA XT) 360 MG 24 hr capsule Take 1 capsule (360 mg total) by mouth daily. 90 capsule 1  . ergocalciferol (VITAMIN D2) 50000 UNITS capsule Take 1 capsule (50,000 Units total) by mouth once a week. One capsule once weekly 12 capsule 3  . estradiol (ESTRACE) 0.1 MG/GM vaginal cream Place 1 Applicatorful vaginally 3 (three) times a week.    . estradiol (VIVELLE-DOT) 0.0375 MG/24HR    12  . exenatide (BYETTA) 10 MCG/0.04ML SOPN injection Inject 0.04 mLs (10 mcg total) into the skin 2 (two) times daily with a meal. 3 pen 3  . Ferrous Sulfate (IRON) 325 (65 Fe) MG TABS Take 1 tablet by mouth 2 (two) times daily.    . fluticasone (FLONASE) 50 MCG/ACT nasal spray Place 2 sprays into both nostrils daily. 48 g 1  . furosemide (LASIX) 20 MG tablet Take 1 tablet (20 mg total) by mouth 3 (three) times daily. 270 tablet 1  . gabapentin (NEURONTIN) 100 MG capsule TAKE 1 CAPSULE BY MOUTH 3 TIMES DAILY 90 capsule PRN  . ibuprofen (ADVIL,MOTRIN) 800 MG tablet TAKE 1 TABLET BY MOUTH 3 TIMES A DAY AS NEEDED ONLY (30 TO LAST 90 DAYS) 30 tablet PRN  . LANTUS SOLOSTAR 100 UNIT/ML Solostar Pen INJECT 60 UNITS UNDER THE SKIN DAILY 60 mL 12  . loratadine (CLARITIN) 10 MG tablet Take 10 mg by mouth daily.    Marland Kitchen losartan (COZAAR) 100 MG tablet TAKE 1 TABLET BY MOUTH DAILY. 90 tablet 1  . metFORMIN (GLUCOPHAGE) 1000 MG tablet TAKE 1 TABLET BY MOUTH 2 TIMES DAILY WITH A MEAL 180 tablet 1  . betamethasone valerate ointment (VALISONE) 0.1 % APPLY TOPICALLY 2 (TWO) TIMES DAILY (Patient not taking: Reported on 12/08/2015) 45 g 5  . Lancet Device MISC Use with verio IQ meter three times daily to test blood sugar  100 each PRN  . LANTUS SOLOSTAR 100 UNIT/ML Solostar Pen  INJECT 60 UNITS UNDER THE SKIN DAILY (Patient not taking: Reported on 12/08/2015) 60 mL 12  . NOVOLOG FLEXPEN 100 UNIT/ML FlexPen INJECT 12 UNITS IN THE MORNING, 10 UNITS AT LUNCHTIME , AND 12 UNITS AT BEDTIME AS DIRECTED 15 mL PRN  . pantoprazole (PROTONIX) 20 MG tablet TAKE 1 TABLET BY MOUTH EVERY DAY 90 tablet 1  . potassium chloride (K-DUR,KLOR-CON) 10 MEQ tablet Take 1 tablet (10 mEq total) by mouth 3 (three) times daily. 270 tablet 1  . promethazine-dextromethorphan (PROMETHAZINE-DM) 6.25-15 MG/5ML syrup Take 5 mLs by mouth at bedtime as needed for cough. 118 mL 0  . UNIFINE PENTIPS 31G X 8 MM MISC USE AS DIRECTED 100 each 3   No current  facility-administered medications for this visit.    Functional Status:   In your present state of health, do you have any difficulty performing the following activities: 12/08/2015 04/26/2015  Hearing? N N  Vision? N N  Difficulty concentrating or making decisions? N N  Walking or climbing stairs? N N  Dressing or bathing? N N  Doing errands, shopping? N N    Fall/Depression Screening:    PHQ 2/9 Scores 08/27/2015 04/26/2015 06/09/2014  PHQ - 2 Score 0 1 2  PHQ- 9 Score - - 9    Assessment:   [employer] employee and Link To Wellness member with Type II DM, HTN and hyperlipidemia. Meeting treatment targets for lipids and BP but with most recent Hgb A1C 7.6% and highly variable fasting blood sugar readings.  Plan:  [facility] CM Care Plan Problem One        Most Recent Value   Care Plan Problem One  Type II DM, HTN and hyperlipidemia - not meeting Hgb A1C target as evidenced by Hgb A1C= 7.6% on 11/29/15, meeting treatment targets for lipids and HTN as evidenced by normal lipid profile on 08/06/15 and BP readings <140/<90   Role Documenting the Problem One  Care Management [place] for Problem One  Active   [facility] Long Term Goal (31-90 days)  Ongoing improvement in glycemic control as evidenced by improved Hgb A1C at next assessment and ongoing good control of lipids and BP as evidenced by meeting treatment targets at next assessments   [facility] Long Term Goal Start Date  12/08/15   [facility] Long Term Goal Met Date     Interventions for Problem One Long Term Goal  reviewed basic metabolic deficits in Type II DM, reviewed DM medications mechanism of action, cautioned Kendra Hanson not to take Novolog insulin unless she eats within 15 minutes of dosing, reminded Kendra Hanson to take Byetta twice daily as directed as she is only taking it daily, reviewed goals set with RD for weight loss, discussed likely reasons for her weight gain (side effect of insulin) despite reduced portions, provided Kendra Hanson with written  CHO controlled meal plans, requested Kendra Hanson  check CBG in pairs at supper and record food intake at supper, arranged  for Link To Wellness follow up on 12/13/15     RNCM to fax today's office visit note to Dr. Moshe Cipro [facility] will meet quarterly and as needed with patient per Link To Wellness program guidelines to assist with Type II DM, HTN and hyperlipidemia self-management and assess patient's progress toward mutually set goals.  Barrington Ellison RN,CCM,CDE [place] Management Coordinator Link To Wellness Office Phone [phone] Office Fax [phone]

## 2015-12-16 ENCOUNTER — Ambulatory Visit (INDEPENDENT_AMBULATORY_CARE_PROVIDER_SITE_OTHER): Payer: 59 | Admitting: Family Medicine

## 2015-12-16 ENCOUNTER — Encounter: Payer: Self-pay | Admitting: Family Medicine

## 2015-12-16 ENCOUNTER — Other Ambulatory Visit: Payer: Self-pay | Admitting: *Deleted

## 2015-12-16 VITALS — BP 138/70 | HR 97 | Resp 18 | Ht 65.0 in | Wt 311.0 lb

## 2015-12-16 DIAGNOSIS — E785 Hyperlipidemia, unspecified: Secondary | ICD-10-CM

## 2015-12-16 DIAGNOSIS — E559 Vitamin D deficiency, unspecified: Secondary | ICD-10-CM

## 2015-12-16 DIAGNOSIS — I10 Essential (primary) hypertension: Secondary | ICD-10-CM | POA: Diagnosis not present

## 2015-12-16 DIAGNOSIS — E1122 Type 2 diabetes mellitus with diabetic chronic kidney disease: Secondary | ICD-10-CM

## 2015-12-16 DIAGNOSIS — E611 Iron deficiency: Secondary | ICD-10-CM

## 2015-12-16 DIAGNOSIS — M170 Bilateral primary osteoarthritis of knee: Secondary | ICD-10-CM

## 2015-12-16 NOTE — Progress Notes (Signed)
Subjective:    [name], female    DOB: 1961-05-07, 55 y.o.   MRN: [id number]  HPI   Kendra Hanson     MRN: [id number]      DOB: 56-25, 1962   HPI Kendra Hanson is here for follow up and re-evaluation of chronic medical conditions, medication management and review of any available recent lab and radiology data.  Preventive health is updated, specifically  Cancer screening and Immunization.   Questions or concerns regarding consultations or procedures which the PT has had in the interim are  addressed. The PT denies any adverse reactions to current medications since the last visit.  Denies polyuria, polydipsia, blurred vision , or hypoglycemic episodes. Blood sugars are higher than recommended however , and she also does state that she is not testing as regularly as she needs to . C/o increased bilateral knee pain and stiffness  ROS Denies recent fever or chills.Chjronic fatigue unchanged Denies sinus pressure, nasal congestion, ear pain or sore throat. Denies chest congestion, productive cough or wheezing. Denies chest pains, palpitations and leg swelling Denies abdominal pain, nausea, vomiting,diarrhea or constipation.   Denies dysuria, frequency, hesitancy or incontinence. . Denies headaches, seizures, numbness, or tingling. Denies depression, anxiety or insomnia. Denies skin break down or rash.   PE  BP 138/70 mmHg  Pulse 97  Resp 18  Ht 5\' 5"  (1.651 m)  Wt 311 lb (141.069 kg)  BMI 51.75 kg/m2  SpO2 93%  Patient alert and oriented and in no cardiopulmonary distress.  HEENT: No facial asymmetry, EOMI,   oropharynx pink and moist.  Neck supple no JVD, no mass.  Chest: Clear to auscultation bilaterally.  CVS: S1, S2 no murmurs, no S3.Regular rate.  ABD: Soft non tender.   Ext: No edema  MS: Adequate though reduced ROM spine,  and knees, worse, with deformity and crepitus  Skin: Intact, no ulcerations or rash noted.  Psych: Good eye contact,  normal affect. Memory intact not anxious or depressed appearing.  CNS: CN 2-12 intact, power,  normal throughout.no focal deficits noted.   Assessment & Plan   Type II diabetes mellitus with renal manifestations Kendra Hanson is reminded of the importance of commitment to daily physical activity for 30 minutes or more, as able and the need to limit carbohydrate intake to 30 to 60 grams per meal to help with blood sugar control.   The need to take medication as prescribed, test blood sugar as directed, and to call between visits if there is a concern that blood sugar is uncontrolled is also discussed.   Kendra Hanson is reminded of the importance of daily foot exam, annual eye examination, and good blood sugar, blood pressure and cholesterol control. Deteriorated, no med change  Diabetic Labs Latest Ref Rng 11/29/2015 08/06/2015 04/22/2015 01/27/2015 09/16/2014  HbA1c <5.7 % 7.6(H) 7.4(H) 8.0(H) 8.5(H) 7.6(H)  Microalbumin <2.0 mg/dL - - - 24.5(H) -  Micro/Creat Ratio 0.0 - 30.0 mg/g - - - 160.3(H) -  Chol 125 - 200 mg/dL - 146 - 140 -  HDL >=46 mg/dL - 48 - 48 -  Calc LDL <130 mg/dL - 84 - 76 -  Triglycerides <150 mg/dL - 72 - 78 -  Creatinine 0.50 - 1.05 mg/dL 1.03 1.03 1.09 0.85 0.97   BP/Weight 12/16/2015 12/08/2015 09/22/2015 08/27/2015 08/09/2015 07/12/2015 123456  Systolic BP 0000000 - 123456 - Q000111Q 0000000 0000000  Diastolic BP 70 - 84 - 74 80 78  Wt. (Lbs) 311 305 309  305 307 306.12 299  BMI 51.75 51.56 52.24 50.75 51.09 50.94 49.76   Foot/eye exam completion dates 07/12/2015 06/09/2014  Foot Form Completion Done Done         Essential hypertension Controlled, no change in medication DASH diet and commitment to daily physical activity for a minimum of 30 minutes discussed and encouraged, as a part of hypertension management. The importance of attaining a healthy weight is also discussed.  BP/Weight 12/16/2015 12/08/2015 09/22/2015 08/27/2015 08/09/2015 07/12/2015 123456  Systolic BP 0000000 - 123456  - Q000111Q 0000000 0000000  Diastolic BP 70 - 84 - 74 80 78  Wt. (Lbs) 311 305 309 305 307 306.12 299  BMI 51.75 51.56 52.24 50.75 51.09 50.94 49.76        Iron deficiency No response to oral iron, refer to hematology, had recent colonoscopy   Hyperlipidemia LDL goal <100 Hyperlipidemia:Low fat diet discussed and encouraged.   Lipid Panel  Lab Results  Component Value Date   CHOL 146 08/06/2015   HDL 48 08/06/2015   LDLCALC 84 08/06/2015   TRIG 72 08/06/2015   CHOLHDL 3.0 08/06/2015   Updated lab needed at/ before next visit.      Vitamin d deficiency On weekly supplement, same to continue  Morbid obesity Deteriorated. Patient re-educated about  the importance of commitment to a  minimum of 150 minutes of exercise per week.  The importance of healthy food choices with portion control discussed. Encouraged to start a food diary, count calories and to consider  joining a support group. Sample diet sheets offered. Goals set by the patient for the next several months.   Weight /BMI 12/16/2015 12/08/2015 09/22/2015  WEIGHT 311 lb 305 lb 309 lb  HEIGHT 5\' 5"  5' 4.5" 5' 4.5"  BMI 51.75 kg/m2 51.56 kg/m2 52.24 kg/m2    Current exercise per week 60 minutes.   Osteoarthritis of both knees Worsening with increased pain due to excessive weight. Pt to use tylenol for pain and work consistently on weight loss, encouraged to also commit to non weight bearing physical activity regularly. Starts water aerobics this week          Review of Systems     Objective:   Physical Exam        Assessment & Plan:

## 2015-12-16 NOTE — Patient Outreach (Signed)
Returned phone call to [place] per her request earlier today. Left message on her cell phone asking her to return call and leave a time that would best suit her for this RNCM to call her back. Barrington Ellison RN,CCM,CDE [place] Management Coordinator Link To Wellness Office Phone [phone] Office Fax [phone]

## 2015-12-16 NOTE — Patient Instructions (Signed)
F/u in 3 month, call if you need me sooner  Fasting labs in 3 months  Commit to regular eating, checking and writing down blood sugar at LEAST 3 times daily Goal for fasting blood sugar ranges from 80 to 120 and 2 hours after any meal or at bedtime should be between 130 to 170.  Get moving for health 30 minutes every day  You are referred to hematology since iron levels remain low despite oral iron  Thanks for choosing [facility], we consider it a privelige to serve you.

## 2015-12-27 ENCOUNTER — Encounter (HOSPITAL_COMMUNITY): Payer: 59

## 2015-12-27 ENCOUNTER — Encounter (HOSPITAL_COMMUNITY): Payer: 59 | Attending: Oncology | Admitting: Oncology

## 2015-12-27 ENCOUNTER — Encounter (HOSPITAL_COMMUNITY): Payer: Self-pay | Admitting: Oncology

## 2015-12-27 VITALS — BP 160/66 | Temp 98.6°F | Resp 20 | Ht 65.0 in | Wt 310.0 lb

## 2015-12-27 DIAGNOSIS — Z833 Family history of diabetes mellitus: Secondary | ICD-10-CM | POA: Insufficient documentation

## 2015-12-27 DIAGNOSIS — Z7984 Long term (current) use of oral hypoglycemic drugs: Secondary | ICD-10-CM | POA: Insufficient documentation

## 2015-12-27 DIAGNOSIS — E119 Type 2 diabetes mellitus without complications: Secondary | ICD-10-CM | POA: Diagnosis not present

## 2015-12-27 DIAGNOSIS — K219 Gastro-esophageal reflux disease without esophagitis: Secondary | ICD-10-CM | POA: Diagnosis not present

## 2015-12-27 DIAGNOSIS — I10 Essential (primary) hypertension: Secondary | ICD-10-CM | POA: Insufficient documentation

## 2015-12-27 DIAGNOSIS — M1710 Unilateral primary osteoarthritis, unspecified knee: Secondary | ICD-10-CM | POA: Insufficient documentation

## 2015-12-27 DIAGNOSIS — M179 Osteoarthritis of knee, unspecified: Secondary | ICD-10-CM | POA: Insufficient documentation

## 2015-12-27 DIAGNOSIS — D649 Anemia, unspecified: Secondary | ICD-10-CM | POA: Diagnosis not present

## 2015-12-27 DIAGNOSIS — E785 Hyperlipidemia, unspecified: Secondary | ICD-10-CM | POA: Insufficient documentation

## 2015-12-27 DIAGNOSIS — Z9889 Other specified postprocedural states: Secondary | ICD-10-CM | POA: Insufficient documentation

## 2015-12-27 DIAGNOSIS — D509 Iron deficiency anemia, unspecified: Secondary | ICD-10-CM | POA: Diagnosis not present

## 2015-12-27 DIAGNOSIS — M170 Bilateral primary osteoarthritis of knee: Secondary | ICD-10-CM | POA: Insufficient documentation

## 2015-12-27 HISTORY — DX: Anemia, unspecified: D64.9

## 2015-12-27 LAB — CBC WITH DIFFERENTIAL/PLATELET
Basophils Absolute: 0.1 10*3/uL (ref 0.0–0.1)
Basophils Relative: 1 %
Eosinophils Absolute: 0.3 10*3/uL (ref 0.0–0.7)
Eosinophils Relative: 3 %
HCT: 36.3 % (ref 36.0–46.0)
Hemoglobin: 12 g/dL (ref 12.0–15.0)
Lymphocytes Relative: 27 %
Lymphs Abs: 2.5 10*3/uL (ref 0.7–4.0)
MCH: 29.3 pg (ref 26.0–34.0)
MCHC: 33.1 g/dL (ref 30.0–36.0)
MCV: 88.5 fL (ref 78.0–100.0)
Monocytes Absolute: 0.4 10*3/uL (ref 0.1–1.0)
Monocytes Relative: 4 %
Neutro Abs: 6 10*3/uL (ref 1.7–7.7)
Neutrophils Relative %: 65 %
Platelets: 306 10*3/uL (ref 150–400)
RBC: 4.1 MIL/uL (ref 3.87–5.11)
RDW: 13.9 % (ref 11.5–15.5)
WBC: 9.3 10*3/uL (ref 4.0–10.5)

## 2015-12-27 LAB — VITAMIN B12: Vitamin B-12: 236 pg/mL (ref 180–914)

## 2015-12-27 LAB — IRON AND TIBC
Iron: 46 ug/dL (ref 28–170)
Saturation Ratios: 13 % (ref 10.4–31.8)
TIBC: 351 ug/dL (ref 250–450)
UIBC: 305 ug/dL

## 2015-12-27 LAB — LACTATE DEHYDROGENASE: LDH: 211 U/L — ABNORMAL HIGH (ref 98–192)

## 2015-12-27 LAB — RETICULOCYTES
RBC.: 4.1 MIL/uL (ref 3.87–5.11)
Retic Count, Absolute: 65.6 10*3/uL (ref 19.0–186.0)
Retic Ct Pct: 1.6 % (ref 0.4–3.1)

## 2015-12-27 LAB — C-REACTIVE PROTEIN: CRP: 2 mg/dL — ABNORMAL HIGH (ref <1.0)

## 2015-12-27 LAB — FERRITIN: Ferritin: 52 ng/mL (ref 11–307)

## 2015-12-27 LAB — FOLATE: Folate: 16.6 ng/mL (ref >5.9)

## 2015-12-27 LAB — SEDIMENTATION RATE: Sed Rate: 46 mm/hr — ABNORMAL HIGH (ref 0–22)

## 2015-12-27 NOTE — Assessment & Plan Note (Signed)
Deteriorated. Patient re-educated about  the importance of commitment to a  minimum of 150 minutes of exercise per week.  The importance of healthy food choices with portion control discussed. Encouraged to start a food diary, count calories and to consider  joining a support group. Sample diet sheets offered. Goals set by the patient for the next several months.   Weight /BMI 12/16/2015 12/08/2015 09/22/2015  WEIGHT 311 lb 305 lb 309 lb  HEIGHT 5\' 5"  5' 4.5" 5' 4.5"  BMI 51.75 kg/m2 51.56 kg/m2 52.24 kg/m2    Current exercise per week 60 minutes.

## 2015-12-27 NOTE — Assessment & Plan Note (Signed)
Controlled, no change in medication DASH diet and commitment to daily physical activity for a minimum of 30 minutes discussed and encouraged, as a part of hypertension management. The importance of attaining a healthy weight is also discussed.  BP/Weight 12/16/2015 12/08/2015 09/22/2015 08/27/2015 08/09/2015 07/12/2015 123456  Systolic BP 0000000 - 123456 - Q000111Q 0000000 0000000  Diastolic BP 70 - 84 - 74 80 78  Wt. (Lbs) 311 305 309 305 307 306.12 299  BMI 51.75 51.56 52.24 50.75 51.09 50.94 49.76

## 2015-12-27 NOTE — Assessment & Plan Note (Signed)
No response to oral iron, refer to hematology, had recent colonoscopy

## 2015-12-27 NOTE — Assessment & Plan Note (Signed)
On weekly supplement, same to continue

## 2015-12-27 NOTE — Progress Notes (Signed)
[facility] Hematology/Oncology Consultation   Name: Kendra Hanson      MRN: [id number]     Date: 12/27/2015 Time:1:22 PM   REFERRING PHYSICIAN:  Tula Nakayama, MD  REASON FOR CONSULT:  Iron deficiency anemia   DIAGNOSIS:  Normocytic, normochromic anemia, S/P colonoscopy by Dr. Laural Golden on 04/05/2015  HISTORY OF PRESENT ILLNESS:   Kendra Hanson is a 55 yo black American female with a past medical history significant for DM type II, sleep apnea, morbid obesity, hyperlipidemia, GERD, HTN, and history of leiomyoma of uterus who is referred to [facility] for iron deficiency anemia by her primary care provider, Dr. Moshe Cipro.  I personally reviewed and went over laboratory results with the patient.  The results are noted within this dictation.  She has a trace anemia that is normocytic and normochromic with preservation of WBC and platelet count.  Her ferritin is reasonable at 73.  No recent TIBC.  Iron level is low at 38.  Renal function is WNL.  HGB A1c demonstrates DM with poorly controlled glucose levels. Historically her ferritin levels have been less than 100 going back to at least 09/2014 and her anemia is first noted on 09/2014 as well.  I personally reviewed and went over radiographic studies with the patient.  The results are noted within this dictation.  Mammogram on 11/19/2015 is BRADS 1.  She is S/P colonoscopy by Dr. Laural Golden on 04/05/2015 which was negative for any pathology or indications for anemia.  Chart reviewed.  Kendra Hanson reports that her ferrous sulfate was recently increased to BID dosing and as a result she has experienced increasing abdominal pain, bloating, and constipation.  She is on ferrous sulfate.  She notes weakness and tiredness.  She admits to pagophagia with crushed ice.  She notes a distant history of red dirt and starch craving.  She denies any any black stools, blood in stool, B symptoms, weight loss, nausea/vomiting, diarrhea, chest pain, blood in  urine, hemoptysis, hematemesis, vaginal bleeding   PAST MEDICAL HISTORY:   Past Medical History  Diagnosis Date  . Insulin dependent diabetes mellitus ([place]) 1996  . Hypertension 2008    Normal CBC and CMet in 2012; negative stress nuclear in 2006- patient asymptomatic  . Obesity   . Hyperlipidemia 2008    Lipid profile in 04/2011:136, 53, 43  . Multiple allergies     perennial  . Fibroids     Uterine  . PONV (postoperative nausea and vomiting)   . Sleep apnea     CPAP  . GERD (gastroesophageal reflux disease)   . Anemia   . Normocytic normochromic anemia 12/27/2015    ALLERGIES: No Known Allergies    MEDICATIONS: I have reviewed the patient's current medications.    Current Outpatient Prescriptions on File Prior to Visit  Medication Sig Dispense Refill  . betamethasone valerate ointment (VALISONE) 0.1 % APPLY TOPICALLY 2 (TWO) TIMES DAILY 45 g 5  . CRESTOR 10 MG tablet TAKE 1 TABLET BY MOUTH ONCE DAILY AT BEDTIME 90 tablet 3  . diltiazem (TAZTIA XT) 360 MG 24 hr capsule Take 1 capsule (360 mg total) by mouth daily. 90 capsule 1  . ergocalciferol (VITAMIN D2) 50000 UNITS capsule Take 1 capsule (50,000 Units total) by mouth once a week. One capsule once weekly 12 capsule 3  . estradiol (VIVELLE-DOT) 0.0375 MG/24HR   12  . exenatide (BYETTA) 10 MCG/0.04ML SOPN injection Inject 0.04 mLs (10 mcg total) into the skin 2 (  two) times daily with a meal. 3 pen 3  . Ferrous Sulfate (IRON) 325 (65 Fe) MG TABS Take 1 tablet by mouth 2 (two) times daily.    . fluticasone (FLONASE) 50 MCG/ACT nasal spray Place 2 sprays into both nostrils daily. 48 g 1  . furosemide (LASIX) 20 MG tablet Take 1 tablet (20 mg total) by mouth 3 (three) times daily. 270 tablet 1  . gabapentin (NEURONTIN) 100 MG capsule TAKE 1 CAPSULE BY MOUTH 3 TIMES DAILY 90 capsule PRN  . ibuprofen (ADVIL,MOTRIN) 800 MG tablet TAKE 1 TABLET BY MOUTH 3 TIMES A DAY AS NEEDED ONLY (30 TO LAST 90 DAYS) 30 tablet PRN  . Lancet Device  MISC Use with verio IQ meter three times daily to test blood sugar  100 each PRN  . LANTUS SOLOSTAR 100 UNIT/ML Solostar Pen INJECT 60 UNITS UNDER THE SKIN DAILY 60 mL 12  . loratadine (CLARITIN) 10 MG tablet Take 10 mg by mouth daily.    Marland Kitchen losartan (COZAAR) 100 MG tablet TAKE 1 TABLET BY MOUTH DAILY. 90 tablet 1  . metFORMIN (GLUCOPHAGE) 1000 MG tablet TAKE 1 TABLET BY MOUTH 2 TIMES DAILY WITH A MEAL 180 tablet 1  . NOVOLOG FLEXPEN 100 UNIT/ML FlexPen INJECT 12 UNITS IN THE MORNING, 10 UNITS AT LUNCHTIME , AND 12 UNITS AT BEDTIME AS DIRECTED 15 mL PRN  . pantoprazole (PROTONIX) 20 MG tablet TAKE 1 TABLET BY MOUTH EVERY DAY 90 tablet 1  . potassium chloride (K-DUR,KLOR-CON) 10 MEQ tablet Take 1 tablet (10 mEq total) by mouth 3 (three) times daily. 270 tablet 1  . UNIFINE PENTIPS 31G X 8 MM MISC USE AS DIRECTED 100 each 3   No current facility-administered medications on file prior to visit.     PAST SURGICAL HISTORY Past Surgical History  Procedure Laterality Date  . Refractive surgery  2011    Bilateral, two seperate occasions first in 2006  . Retinal detachment surgery Bilateral 05/2005  . Dental surgery    . Colonoscopy N/A 04/05/2015    Procedure: COLONOSCOPY;  Surgeon: Rogene Houston, MD;  Location[facility];  Service: Endoscopy;  Laterality: N/A;  730    FAMILY HISTORY: Family History  Problem Relation Age of Onset  . Hypertension Mother   . Diabetes Mother   . Kidney failure Mother   . Stroke Mother   . Kidney failure Father   . Diabetes Father   . Heart attack Father   . COPD Sister   . Diabetes Brother   . Diabetes Brother   . Diabetes Brother   . Diabetes Brother   . Diabetes Brother   . Hypertension Brother    Mother deceased 70 yo with a history of stroke Father 24 yo deceased in the OR during AV shunt procedure for hemodialysis There was a total of 8 siblings, all deceased. She has 2 boys, ages 63 and 56 yo.  55 yo is in [place] and he is in the [employer].   56 yo is in [place].  She has 2 grandchildren a boy and girl.    No cancers in the family.  SOCIAL HISTORY:  reports that she has never smoked. She has never used smokeless tobacco. She reports that she does not drink alcohol or use illicit drugs.  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    PERFORMANCE STATUS: The patient's performance status is 0 - Asymptomatic  PHYSICAL EXAM: Most Recent  Vital Signs: Blood pressure 160/66, temperature 98.6 F (37 C), temperature source Oral, resp. rate 20, height 5' 5"  (1.651 m), weight 310 lb (140.615 kg), SpO2 97 %. General appearance: alert, cooperative, appears stated age, no distress, morbidly obese and unaccompanied Head: Normocephalic, without obvious abnormality, atraumatic Eyes: negative findings: lids and lashes normal, conjunctivae and sclerae normal, corneas clear and pupils equal, round, reactive to light and accomodation Nose: Nares normal. Septum midline. Mucosa normal. No drainage or sinus tenderness. Throat: normal findings: lips normal without lesions, buccal mucosa normal, tongue midline and normal and oropharynx pink & moist without lesions or evidence of thrush and abnormal findings: dentition: upper and lower dentures Neck: supple, symmetrical, trachea midline Lungs: clear to auscultation bilaterally and normal percussion bilaterally Heart: regular rate and rhythm, S1, S2 normal, no murmur, click, rub or gallop Abdomen: soft, non-tender; bowel sounds normal; no masses,  no organomegaly Extremities: extremities normal, atraumatic, no cyanosis or edema Skin: Skin color, texture, turgor normal. No rashes or lesions Lymph nodes: Cervical, supraclavicular, and axillary nodes normal. Neurologic: Alert and oriented X 3, normal strength and tone. Normal symmetric reflexes. Normal coordination and gait  LABORATORY DATA:  CBC    Component Value Date/Time   WBC 9.3 12/27/2015 1235   RBC 4.10 12/27/2015 1235     RBC 4.10 12/27/2015 1235   HGB 12.0 12/27/2015 1235   HCT 36.3 12/27/2015 1235   PLT 306 12/27/2015 1235   MCV 88.5 12/27/2015 1235   MCH 29.3 12/27/2015 1235   MCHC 33.1 12/27/2015 1235   RDW 13.9 12/27/2015 1235   LYMPHSABS 2.5 12/27/2015 1235   MONOABS 0.4 12/27/2015 1235   EOSABS 0.3 12/27/2015 1235   BASOSABS 0.1 12/27/2015 1235      Chemistry      Component Value Date/Time   NA 139 11/29/2015 1624   K 4.6 11/29/2015 1624   CL 102 11/29/2015 1624   CO2 31 11/29/2015 1624   BUN 15 11/29/2015 1624   CREATININE 1.03 11/29/2015 1624   CREATININE 0.81 01/12/2011 2311      Component Value Date/Time   CALCIUM 9.3 11/29/2015 1624   ALKPHOS 99 08/06/2015 0706   AST 11 08/06/2015 0706   ALT 7 08/06/2015 0706   BILITOT 0.4 08/06/2015 0706     Lab Results  Component Value Date   IRON 38* 11/29/2015   FERRITIN 73 11/29/2015   Lab Results  Component Value Date   HGBA1C 7.6* 11/29/2015    RADIOGRAPHY:  CLINICAL DATA: Screening.  EXAM: DIGITAL SCREENING BILATERAL MAMMOGRAM WITH CAD  COMPARISON: Previous exam(s).  ACR Breast Density Category b: There are scattered areas of fibroglandular density.  FINDINGS: There are no findings suspicious for malignancy. Images were processed with CAD.  IMPRESSION: No mammographic evidence of malignancy. A result letter of this screening mammogram will be mailed directly to the patient.  RECOMMENDATION: Screening mammogram in one year. (Code:[id number]-B-01Y)  BI-RADS CATEGORY 1: Negative.   Electronically Signed  By: Lillia Mountain M.D.  On: 11/22/2015 13:10   PATHOLOGY:  NONE  ASSESSMENT/PLAN:   Normocytic normochromic anemia History of iron deficiency Intolerance to oral iron  Minimal normocytic, normochromic anemia dating back to 09/2014 with ferritin level of 73 most recently.  Colonoscopy by Dr. Laural Golden on 04/05/2015 was negative for any pathology.  Intolerant to ferrous sulfate with abdominal  pain/bloating/constipation.  Labs today: CBC diff, anemia panel, retic, ESR, CRP, LDH, haptoglobin, EPO level, SPEP and IFE with light chain assay, peripheral smear review.  Stop PO ferrous  sulfate due to abdominal pain/bloating/constipation.  Return in 2-3 weeks for follow-up. We will review labs at that time with additional recommendations regarding her anemia to follow.  All questions were answered. The patient knows to call the clinic with any problems, questions or concerns. We can certainly see the patient much sooner if necessary.  This note is electronically signed by: Molli Hazard, MD  12/27/2015 1:22 PM

## 2015-12-27 NOTE — Patient Instructions (Addendum)
[place] at [facility] Discharge Instructions  RECOMMENDATIONS MADE BY THE CONSULTANT AND ANY TEST RESULTS WILL BE SENT TO YOUR REFERRING PHYSICIAN.  Stop oral Ferrous sulfate  Return in 2 weeks for follow up with Dr. Whitney Muse  Thank you for choosing [place] at [facility] to provide your oncology and hematology care.  To afford each patient quality time with our provider, please arrive at least 15 minutes before your scheduled appointment time.   Beginning January 23rd 2017 lab work for the [facility] will be done in the  Main lab at [facility] on 1st floor. If you have a lab appointment with the [facility] please come in thru the  Main Entrance and check in at the main information desk  You need to re-schedule your appointment should you arrive 10 or more minutes late.  We strive to give you quality time with our providers, and arriving late affects you and other patients whose appointments are after yours.  Also, if you no show three or more times for appointments you may be dismissed from the clinic at the providers discretion.     Again, thank you for choosing [facility].  Our hope is that these requests will decrease the amount of time that you wait before being seen by our physicians.       _____________________________________________________________  Should you have questions after your visit to [facility], please contact our office at [phone] between the hours of 8:30 a.m. and 4:30 p.m.  Voicemails left after 4:30 p.m. will not be returned until the following business day.  For prescription refill requests, have your pharmacy contact our office.

## 2015-12-27 NOTE — Assessment & Plan Note (Signed)
Kendra Hanson is reminded of the importance of commitment to daily physical activity for 30 minutes or more, as able and the need to limit carbohydrate intake to 30 to 60 grams per meal to help with blood sugar control.   The need to take medication as prescribed, test blood sugar as directed, and to call between visits if there is a concern that blood sugar is uncontrolled is also discussed.   Kendra Hanson is reminded of the importance of daily foot exam, annual eye examination, and good blood sugar, blood pressure and cholesterol control. Deteriorated, no med change  Diabetic Labs Latest Ref Rng 11/29/2015 08/06/2015 04/22/2015 01/27/2015 09/16/2014  HbA1c <5.7 % 7.6(H) 7.4(H) 8.0(H) 8.5(H) 7.6(H)  Microalbumin <2.0 mg/dL - - - 24.5(H) -  Micro/Creat Ratio 0.0 - 30.0 mg/g - - - 160.3(H) -  Chol 125 - 200 mg/dL - 146 - 140 -  HDL >=46 mg/dL - 48 - 48 -  Calc LDL <130 mg/dL - 84 - 76 -  Triglycerides <150 mg/dL - 72 - 78 -  Creatinine 0.50 - 1.05 mg/dL 1.03 1.03 1.09 0.85 0.97   BP/Weight 12/16/2015 12/08/2015 09/22/2015 08/27/2015 08/09/2015 07/12/2015 123456  Systolic BP 0000000 - 123456 - [id number] 0000000 0000000  Diastolic BP 70 - 84 - 74 80 78  Wt. (Lbs) 311 305 309 305 307 306.12 299  BMI 51.75 51.56 52.24 50.75 51.09 50.94 49.76   Foot/eye exam completion dates 07/12/2015 06/09/2014  Foot Form Completion Done Done

## 2015-12-27 NOTE — Assessment & Plan Note (Signed)
Worsening with increased pain due to excessive weight. Pt to use tylenol for pain and work consistently on weight loss, encouraged to also commit to non weight bearing physical activity regularly. Starts water aerobics this week

## 2015-12-27 NOTE — Assessment & Plan Note (Signed)
Hyperlipidemia:Low fat diet discussed and encouraged.   Lipid Panel  Lab Results  Component Value Date   CHOL 146 08/06/2015   HDL 48 08/06/2015   LDLCALC 84 08/06/2015   TRIG 72 08/06/2015   CHOLHDL 3.0 08/06/2015   Updated lab needed at/ before next visit.

## 2015-12-27 NOTE — Assessment & Plan Note (Addendum)
Minimal normocytic, normochromic anemia dating back to 09/2014 with ferritin level of 73 most recently.  Colonoscopy by Dr. Laural Golden on 04/05/2015 was negative for any pathology.  Intolerant to ferrous sulfate with abdominal pain/bloating/constipation.  Labs today: CBC diff, anemia panel, retic, ESR, CRP, LDH, haptoglobin, EPO level, SPEP and IFE with light chain assay, peripheral smear review.  Stop PO ferrous sulfate due to abdominal pain/bloating/constipation.  Return in 2-3 weeks for follow-up.

## 2015-12-28 ENCOUNTER — Other Ambulatory Visit (HOSPITAL_COMMUNITY): Payer: Self-pay | Admitting: Oncology

## 2015-12-28 ENCOUNTER — Other Ambulatory Visit: Payer: Self-pay | Admitting: Family Medicine

## 2015-12-28 DIAGNOSIS — D509 Iron deficiency anemia, unspecified: Secondary | ICD-10-CM

## 2015-12-28 DIAGNOSIS — D649 Anemia, unspecified: Secondary | ICD-10-CM

## 2015-12-28 LAB — IGG, IGA, IGM
IgA: 138 mg/dL (ref 87–352)
IgG (Immunoglobin G), Serum: 1417 mg/dL (ref 700–1600)
IgM, Serum: 76 mg/dL (ref 26–217)

## 2015-12-28 LAB — IMMUNOFIXATION ELECTROPHORESIS
IgA: 136 mg/dL (ref 87–352)
IgG (Immunoglobin G), Serum: 1414 mg/dL (ref 700–1600)
IgM, Serum: 72 mg/dL (ref 26–217)
Total Protein ELP: 7.2 g/dL (ref 6.0–8.5)

## 2015-12-28 LAB — PROTEIN ELECTROPHORESIS, SERUM
A/G Ratio: 1 (ref 0.7–1.7)
Albumin ELP: 3.6 g/dL (ref 2.9–4.4)
Alpha-1-Globulin: 0.3 g/dL (ref 0.0–0.4)
Alpha-2-Globulin: 0.8 g/dL (ref 0.4–1.0)
Beta Globulin: 1.2 g/dL (ref 0.7–1.3)
Gamma Globulin: 1.3 g/dL (ref 0.4–1.8)
Globulin, Total: 3.5 g/dL (ref 2.2–3.9)
Total Protein ELP: 7.1 g/dL (ref 6.0–8.5)

## 2015-12-28 LAB — KAPPA/LAMBDA LIGHT CHAINS
Kappa free light chain: 31.11 mg/L — ABNORMAL HIGH (ref 3.30–19.40)
Kappa, lambda light chain ratio: 1.51 (ref 0.26–1.65)
Lambda free light chains: 20.55 mg/L (ref 5.71–26.30)

## 2015-12-28 LAB — HAPTOGLOBIN: Haptoglobin: 217 mg/dL — ABNORMAL HIGH (ref 34–200)

## 2015-12-28 LAB — ERYTHROPOIETIN: Erythropoietin: 25.7 m[IU]/mL — ABNORMAL HIGH (ref 2.6–18.5)

## 2015-12-28 MED FILL — POTASSIUM CL ER 10 MEQ TAB: 10 | 90 days supply | Qty: 270 | Fill #0

## 2015-12-28 MED FILL — DILTIAZEM HCL ER 360 MG CAP: 360 | 90 days supply | Qty: 90 | Fill #0

## 2016-01-04 ENCOUNTER — Encounter (HOSPITAL_BASED_OUTPATIENT_CLINIC_OR_DEPARTMENT_OTHER): Payer: 59

## 2016-01-04 ENCOUNTER — Encounter (HOSPITAL_COMMUNITY): Payer: Self-pay

## 2016-01-04 VITALS — BP 147/50 | Temp 97.8°F | Resp 20

## 2016-01-04 DIAGNOSIS — D649 Anemia, unspecified: Secondary | ICD-10-CM | POA: Diagnosis not present

## 2016-01-04 DIAGNOSIS — D509 Iron deficiency anemia, unspecified: Secondary | ICD-10-CM

## 2016-01-04 MED ORDER — SODIUM CHLORIDE 0.9 % IV SOLN
INTRAVENOUS | Status: DC
  Administered 2016-01-04: 15:00:00 via INTRAVENOUS

## 2016-01-04 MED ORDER — SODIUM CHLORIDE 0.9 % IV SOLN
125.0000 mg | Freq: Once | INTRAVENOUS | Status: AC
  Administered 2016-01-04: 125 mg via INTRAVENOUS
  Filled 2016-01-04: qty 10

## 2016-01-04 NOTE — Progress Notes (Signed)
Patient received IV iron per orders. Tolerated well.

## 2016-01-04 NOTE — Patient Instructions (Signed)
[facility] at [facility] Discharge Instructions  RECOMMENDATIONS MADE BY THE CONSULTANT AND ANY TEST RESULTS WILL BE SENT TO YOUR REFERRING PHYSICIAN.  You received IV iron today.  Follow up as scheduled.  Thank you for choosing  [facility] at [facility] to provide your oncology and hematology care.  To afford each patient quality time with our provider, please arrive at least 15 minutes before your scheduled appointment time.   Beginning January 23rd 2017 lab work for the Cancer Center will be done in the  Main lab at [facility] on 1st floor. If you have a lab appointment with the Cancer Center please come in thru the  Main Entrance and check in at the main information desk  You need to re-schedule your appointment should you arrive 10 or more minutes late.  We strive to give you quality time with our providers, and arriving late affects you and other patients whose appointments are after yours.  Also, if you no show three or more times for appointments you may be dismissed from the clinic at the providers discretion.     Again, thank you for choosing [facility].  Our hope is that these requests will decrease the amount of time that you wait before being seen by our physicians.       _____________________________________________________________  Should you have questions after your visit to [facility], please contact our office at [phone] between the hours of 8:30 a.m. and 4:30 p.m.  Voicemails left after 4:30 p.m. will not be returned until the following business day.  For prescription refill requests, have your pharmacy contact our office.    

## 2016-01-11 ENCOUNTER — Encounter (HOSPITAL_BASED_OUTPATIENT_CLINIC_OR_DEPARTMENT_OTHER): Payer: 59

## 2016-01-11 VITALS — BP 147/55 | HR 68 | Temp 98.7°F | Resp 18

## 2016-01-11 DIAGNOSIS — D509 Iron deficiency anemia, unspecified: Secondary | ICD-10-CM

## 2016-01-11 DIAGNOSIS — D649 Anemia, unspecified: Secondary | ICD-10-CM

## 2016-01-11 MED ORDER — SODIUM CHLORIDE 0.9 % IV SOLN
125.0000 mg | Freq: Once | INTRAVENOUS | Status: AC
  Administered 2016-01-11: 125 mg via INTRAVENOUS
  Filled 2016-01-11: qty 10

## 2016-01-11 NOTE — Progress Notes (Signed)
Tolerated iron infusion well. Ambulatory on discharge home with friend.

## 2016-01-11 NOTE — Patient Instructions (Signed)
[facility] at [facility] Discharge Instructions  RECOMMENDATIONS MADE BY THE CONSULTANT AND ANY TEST RESULTS WILL BE SENT TO YOUR REFERRING PHYSICIAN.  Ferric gluconate 125 mg iron infusion given today as ordered. Return as scheduled.  Thank you for choosing [facility] at [facility] to provide your oncology and hematology care.  To afford each patient quality time with our provider, please arrive at least 15 minutes before your scheduled appointment time.   Beginning January 23rd 2017 lab work for the Cancer Center will be done in the  Main lab at [place] on 1st floor. If you have a lab appointment with the Cancer Center please come in thru the  Main Entrance and check in at the main information desk  You need to re-schedule your appointment should you arrive 10 or more minutes late.  We strive to give you quality time with our providers, and arriving late affects you and other patients whose appointments are after yours.  Also, if you no show three or more times for appointments you may be dismissed from the clinic at the providers discretion.     Again, thank you for choosing [facility].  Our hope is that these requests will decrease the amount of time that you wait before being seen by our physicians.       _____________________________________________________________  Should you have questions after your visit to  Cancer Center, please contact our office at [phone] between the hours of 8:30 a.m. and 4:30 p.m.  Voicemails left after 4:30 p.m. will not be returned until the following business day.  For prescription refill requests, have your pharmacy contact our office.    

## 2016-01-14 ENCOUNTER — Encounter (HOSPITAL_COMMUNITY): Payer: 59

## 2016-01-14 ENCOUNTER — Encounter (HOSPITAL_COMMUNITY): Payer: Self-pay | Admitting: Hematology & Oncology

## 2016-01-14 ENCOUNTER — Other Ambulatory Visit (HOSPITAL_COMMUNITY): Payer: Self-pay | Admitting: Oncology

## 2016-01-14 ENCOUNTER — Encounter (HOSPITAL_COMMUNITY): Payer: 59 | Attending: Hematology & Oncology | Admitting: Hematology & Oncology

## 2016-01-14 VITALS — BP 152/100 | HR 90 | Temp 98.7°F | Resp 20 | Wt 309.0 lb

## 2016-01-14 DIAGNOSIS — E785 Hyperlipidemia, unspecified: Secondary | ICD-10-CM | POA: Insufficient documentation

## 2016-01-14 DIAGNOSIS — I10 Essential (primary) hypertension: Secondary | ICD-10-CM | POA: Diagnosis not present

## 2016-01-14 DIAGNOSIS — D649 Anemia, unspecified: Secondary | ICD-10-CM | POA: Diagnosis not present

## 2016-01-14 DIAGNOSIS — K219 Gastro-esophageal reflux disease without esophagitis: Secondary | ICD-10-CM | POA: Insufficient documentation

## 2016-01-14 DIAGNOSIS — D509 Iron deficiency anemia, unspecified: Secondary | ICD-10-CM | POA: Insufficient documentation

## 2016-01-14 DIAGNOSIS — Z833 Family history of diabetes mellitus: Secondary | ICD-10-CM | POA: Insufficient documentation

## 2016-01-14 DIAGNOSIS — E611 Iron deficiency: Secondary | ICD-10-CM

## 2016-01-14 DIAGNOSIS — E119 Type 2 diabetes mellitus without complications: Secondary | ICD-10-CM | POA: Diagnosis not present

## 2016-01-14 DIAGNOSIS — E538 Deficiency of other specified B group vitamins: Secondary | ICD-10-CM | POA: Diagnosis not present

## 2016-01-14 DIAGNOSIS — Z7984 Long term (current) use of oral hypoglycemic drugs: Secondary | ICD-10-CM | POA: Insufficient documentation

## 2016-01-14 DIAGNOSIS — Z9889 Other specified postprocedural states: Secondary | ICD-10-CM | POA: Diagnosis not present

## 2016-01-14 LAB — FERRITIN: Ferritin: 148 ng/mL (ref 11–307)

## 2016-01-14 LAB — CBC
HCT: 37.3 % (ref 36.0–46.0)
Hemoglobin: 12 g/dL (ref 12.0–15.0)
MCH: 28.4 pg (ref 26.0–34.0)
MCHC: 32.2 g/dL (ref 30.0–36.0)
MCV: 88.4 fL (ref 78.0–100.0)
Platelets: 296 10*3/uL (ref 150–400)
RBC: 4.22 MIL/uL (ref 3.87–5.11)
RDW: 14 % (ref 11.5–15.5)
WBC: 10 10*3/uL (ref 4.0–10.5)

## 2016-01-14 MED ORDER — CYANOCOBALAMIN 1000 MCG/ML IJ SOLN
1000.0000 ug | Freq: Once | INTRAMUSCULAR | Status: AC
  Administered 2016-01-14: 1000 ug via INTRAMUSCULAR

## 2016-01-14 MED ORDER — CYANOCOBALAMIN 1000 MCG/ML IJ SOLN
INTRAMUSCULAR | Status: AC
Start: 2016-01-14 — End: 2016-01-14
  Filled 2016-01-14: qty 1

## 2016-01-14 NOTE — Progress Notes (Signed)
Kendra Hanson presents today for injection per the provider's orders.  Vitamin b12 administration without incident; see MAR for injection details.  Patient tolerated procedure well and without incident.  No questions or complaints noted at this time.

## 2016-01-14 NOTE — Patient Instructions (Addendum)
[place] at [facility] Discharge Instructions  RECOMMENDATIONS MADE BY THE CONSULTANT AND ANY TEST RESULTS WILL BE SENT TO YOUR REFERRING PHYSICIAN.  Exam and discussion by Dr Whitney Muse today Your B12 was low normal, we are going to give you weekly B12 x4.  Then after you finish the weekly B12 then start taking sublingual 530mcg.  Referral back to Dr Laural Golden, they will call you with this appt. Blood work looks good, ferritin is still pending we will call you with those results  Labs every 8 weeks Return to see the doctor in 4 months Please call the clinic if you have any questions or concerns   Thank you for choosing [place] at [facility] to provide your oncology and hematology care.  To afford each patient quality time with our provider, please arrive at least 15 minutes before your scheduled appointment time.   Beginning January 23rd 2017 lab work for the [facility] will be done in the  Main lab at [facility] on 1st floor. If you have a lab appointment with the [facility] please come in thru the  Main Entrance and check in at the main information desk  You need to re-schedule your appointment should you arrive 10 or more minutes late.  We strive to give you quality time with our providers, and arriving late affects you and other patients whose appointments are after yours.  Also, if you no show three or more times for appointments you may be dismissed from the clinic at the providers discretion.     Again, thank you for choosing [facility].  Our hope is that these requests will decrease the amount of time that you wait before being seen by our physicians.       _____________________________________________________________  Should you have questions after your visit to [facility], please contact our office at [phone] between the hours of 8:30 a.m. and 4:30 p.m.  Voicemails left after 4:30 p.m. will  not be returned until the following business day.  For prescription refill requests, have your pharmacy contact our office.         Resources For Cancer Patients and their Caregivers ? American Cancer Society: Can assist with transportation, wigs, general needs, runs Look Good Feel Better.        (548) 833-6905 ? Cancer Care: Provides financial assistance, online support groups, medication/co-pay assistance.  1-800-813-HOPE 514-076-6371) ? [facility] Assists [place] cancer patients and their families through emotional , educational and financial support.  564-565-5046 ? [place] DSS Where to apply for food stamps, Medicaid and utility assistance. 931-754-1753 ? [facility]: Transportation to medical appointments. 281-772-2660 ? Social Security Administration: May apply for disability if have a Stage IV cancer. 332-302-3066 747 050 9962 ? [employer], Disability and Transit Services: Assists with nutrition, care and transit needs. (361)862-2885

## 2016-01-14 NOTE — Progress Notes (Signed)
[facility] Hematology/Oncology Progress Note  Name: Kendra Hanson      MRN: [id number]     Date: 01/14/2016 Time:11:31 AM   REFERRING PHYSICIAN:  Tula Nakayama, MD  REASON FOR CONSULT:  Iron deficiency anemia   DIAGNOSIS:  Normocytic, normochromic anemia, S/P colonoscopy by Dr. Laural Golden on 04/05/2015  HISTORY OF PRESENT ILLNESS:   Kendra Hanson is a 55 yo black American female with a past medical history significant for DM type II, sleep apnea, morbid obesity, hyperlipidemia, GERD, HTN, and history of leiomyoma of uterus who is referred to [facility] for iron deficiency anemia by her primary care provider, Dr. Moshe Cipro.  Kendra Hanson returns to the [facility] today accompanied by her granddaughter. Her main complaint today is general weakness, along with a tingling in her hand and swelling in her feet. She says that she has been feeling weak lately, and woke up this morning feeling similar.  Ms. Lavy notes that she just wants to feel better.Laboratory studies from her last visit were reviewed and remarkable for low B12, mildly low ferritin and mildly elevated inflammatory markers. She was advised that starting B12 injections may help to resolve her symptoms.   Kendra Hanson is concerned about H. Pylori as a cause of ongoing nausea. She has seen Dr. Laural Golden in the past.   PAST MEDICAL HISTORY:   Past Medical History  Diagnosis Date  . Insulin dependent diabetes mellitus ([place]) 1996  . Hypertension 2008    Normal CBC and CMet in 2012; negative stress nuclear in 2006- patient asymptomatic  . Obesity   . Hyperlipidemia 2008    Lipid profile in 04/2011:136, 53, 43  . Multiple allergies     perennial  . Fibroids     Uterine  . PONV (postoperative nausea and vomiting)   . Sleep apnea     CPAP  . GERD (gastroesophageal reflux disease)   . Anemia   . Normocytic normochromic anemia 12/27/2015    ALLERGIES: No Known Allergies    MEDICATIONS: I have reviewed  the patient's current medications.    Current Outpatient Prescriptions on File Prior to Visit  Medication Sig Dispense Refill  . betamethasone valerate ointment (VALISONE) 0.1 % APPLY TOPICALLY 2 (TWO) TIMES DAILY 45 g 5  . CRESTOR 10 MG tablet TAKE 1 TABLET BY MOUTH ONCE DAILY AT BEDTIME 90 tablet 3  . ergocalciferol (VITAMIN D2) 50000 UNITS capsule Take 1 capsule (50,000 Units total) by mouth once a week. One capsule once weekly 12 capsule 3  . estradiol (VIVELLE-DOT) 0.0375 MG/24HR   12  . exenatide (BYETTA) 10 MCG/0.04ML SOPN injection Inject 0.04 mLs (10 mcg total) into the skin 2 (two) times daily with a meal. 3 pen 3  . Ferrous Sulfate (IRON) 325 (65 Fe) MG TABS Take 1 tablet by mouth 2 (two) times daily.    . fluticasone (FLONASE) 50 MCG/ACT nasal spray Place 2 sprays into both nostrils daily. 48 g 1  . furosemide (LASIX) 20 MG tablet Take 1 tablet (20 mg total) by mouth 3 (three) times daily. 270 tablet 1  . gabapentin (NEURONTIN) 100 MG capsule TAKE 1 CAPSULE BY MOUTH 3 TIMES DAILY 90 capsule PRN  . ibuprofen (ADVIL,MOTRIN) 800 MG tablet TAKE 1 TABLET BY MOUTH 3 TIMES A DAY AS NEEDED ONLY (30 TO LAST 90 DAYS) 30 tablet PRN  . Lancet Device MISC Use with verio IQ meter three times daily to test blood sugar  100 each PRN  .  LANTUS SOLOSTAR 100 UNIT/ML Solostar Pen INJECT 60 UNITS UNDER THE SKIN DAILY 60 mL 12  . loratadine (CLARITIN) 10 MG tablet Take 10 mg by mouth daily.    Marland Kitchen losartan (COZAAR) 100 MG tablet TAKE 1 TABLET BY MOUTH DAILY. 90 tablet 1  . metFORMIN (GLUCOPHAGE) 1000 MG tablet TAKE 1 TABLET BY MOUTH 2 TIMES DAILY WITH A MEAL 180 tablet 1  . NOVOLOG FLEXPEN 100 UNIT/ML FlexPen INJECT 12 UNITS IN THE MORNING, 10 UNITS AT LUNCHTIME , AND 12 UNITS AT BEDTIME AS DIRECTED 15 mL PRN  . pantoprazole (PROTONIX) 20 MG tablet TAKE 1 TABLET BY MOUTH EVERY DAY 90 tablet 1  . potassium chloride (K-DUR,KLOR-CON) 10 MEQ tablet Take 1 tablet (10 mEq total) by mouth 3 (three) times daily.  270 tablet 1  . TAZTIA XT 360 MG 24 hr capsule TAKE 1 CAPSULE BY MOUTH DAILY. 90 capsule 1  . UNIFINE PENTIPS 31G X 8 MM MISC USE AS DIRECTED 100 each 3   No current facility-administered medications on file prior to visit.     PAST SURGICAL HISTORY Past Surgical History  Procedure Laterality Date  . Refractive surgery  2011    Bilateral, two seperate occasions first in 2006  . Retinal detachment surgery Bilateral 05/2005  . Dental surgery    . Colonoscopy N/A 04/05/2015    Procedure: COLONOSCOPY;  Surgeon: Rogene [place], MD;  [facility];  Service: Endoscopy;  Laterality: N/A;  730    FAMILY HISTORY: Family History  Problem Relation Age of Onset  . Hypertension Mother   . Diabetes Mother   . Kidney failure Mother   . Stroke Mother   . Kidney failure Father   . Diabetes Father   . Heart attack Father   . COPD Sister   . Diabetes Brother   . Diabetes Brother   . Diabetes Brother   . Diabetes Brother   . Diabetes Brother   . Hypertension Brother    Mother deceased 107 yo with a history of stroke Father 51 yo deceased in the OR during AV shunt procedure for hemodialysis There was a total of 8 siblings, all deceased. She has 2 boys, ages 65 and 56 yo.  56 yo is in [place] and he is in the [employer].  56 yo is in [place].  She has 2 grandchildren a boy and girl.    No cancers in the family.  SOCIAL HISTORY:  reports that she has never smoked. She has never used smokeless tobacco. She reports that she does not drink alcohol or use illicit drugs.  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    PERFORMANCE STATUS: The patient's performance status is 0 - Asymptomatic  14 point review of systems was performed and is negative except as detailed under history of present illness and above   PHYSICAL EXAM: Most Recent Vital Signs: Blood pressure 152/100, pulse 90, temperature 98.7 F (37.1 C), temperature source Oral, resp. rate 20, weight  309 lb (140.161 kg), SpO2 99 %. General appearance: alert, cooperative, appears stated age, no distress, morbidly obese and unaccompanied Head: Normocephalic, without obvious abnormality, atraumatic Eyes: negative findings: lids and lashes normal, conjunctivae and sclerae normal, corneas clear and pupils equal, round, reactive to light and accomodation Nose: Nares normal. Septum midline. Mucosa normal. No drainage or sinus tenderness. Throat: normal findings: lips normal without lesions, buccal mucosa normal, tongue midline and normal and oropharynx pink & moist  without lesions or evidence of thrush and abnormal findings: dentition: upper and lower dentures Neck: supple, symmetrical, trachea midline Lungs: clear to auscultation bilaterally and normal percussion bilaterally Heart: regular rate and rhythm, S1, S2 normal, no murmur, click, rub or gallop Abdomen: soft, non-tender; bowel sounds normal; no masses,  no organomegaly Extremities: extremities normal, atraumatic, no cyanosis or edema Skin: Skin color, texture, turgor normal. No rashes or lesions Lymph nodes: Cervical, supraclavicular, and axillary nodes normal. Neurologic: Alert and oriented X 3, normal strength and tone. Normal symmetric reflexes. Normal coordination and gait  LABORATORY DATA:  I have reviewed the data as listed.  CBC    Component Value Date/Time   WBC 10.0 01/14/2016 0949   RBC 4.22 01/14/2016 0949   RBC 4.10 12/27/2015 1235   HGB 12.0 01/14/2016 0949   HCT 37.3 01/14/2016 0949   PLT 296 01/14/2016 0949   MCV 88.4 01/14/2016 0949   MCH 28.4 01/14/2016 0949   MCHC 32.2 01/14/2016 0949   RDW 14.0 01/14/2016 0949   LYMPHSABS 2.5 12/27/2015 1235   MONOABS 0.4 12/27/2015 1235   EOSABS 0.3 12/27/2015 1235   BASOSABS 0.1 12/27/2015 1235      Chemistry      Component Value Date/Time   NA 139 11/29/2015 1624   K 4.6 11/29/2015 1624   CL 102 11/29/2015 1624   CO2 31 11/29/2015 1624   BUN 15 11/29/2015 1624     CREATININE 1.03 11/29/2015 1624   CREATININE 0.81 01/12/2011 2311      Component Value Date/Time   CALCIUM 9.3 11/29/2015 1624   ALKPHOS 99 08/06/2015 0706   AST 11 08/06/2015 0706   ALT 7 08/06/2015 0706   BILITOT 0.4 08/06/2015 0706     Lab Results  Component Value Date   IRON 46 12/27/2015   TIBC 351 12/27/2015   FERRITIN 52 12/27/2015   Lab Results  Component Value Date   HGBA1C 7.6* 11/29/2015   Results for DEONA, HAGGAN (MRN [id number])   Ref. Range 12/27/2015 12:35  LDH Latest Ref Range: 98-192 U/L 211 (H)  Iron Latest Ref Range: 28-170 ug/dL 46  UIBC Latest Units: ug/dL 305  TIBC Latest Ref Range: 250-450 ug/dL 351  Saturation Ratios Latest Ref Range: 10.4-31.8 % 13  Ferritin Latest Ref Range: 11-307 ng/mL 52  Folate Latest Ref Range: >5.9 ng/mL 16.6  CRP Latest Ref Range: <1.0 mg/dL 2.0 (H)  Vitamin B12 Latest Ref Range: 180-914 pg/mL 236   Results for RETHIA, KEY (MRN [id number])   Ref. Range 12/27/2015 12:35  IgG (Immunoglobin G), Serum Latest Ref Range: 706-656-2278 mg/dL 1417  IgA Latest Ref Range: 87-352 mg/dL 138  IgM, Serum Latest Ref Range: 26-217 mg/dL 76  Kappa free light chain Latest Ref Range: 3.30-19.40 mg/L 31.11 (H)  Lamda free light chains Latest Ref Range: 5.71-26.30 mg/L 20.55  Kappa, lamda light chain ratio Latest Ref Range: 0.26-1.65  1.51    RADIOGRAPHY:  CLINICAL DATA: Screening.  EXAM: DIGITAL SCREENING BILATERAL MAMMOGRAM WITH CAD  COMPARISON: Previous exam(s).  ACR Breast Density Category b: There are scattered areas of fibroglandular density.  FINDINGS: There are no findings suspicious for malignancy. Images were processed with CAD.  IMPRESSION: No mammographic evidence of malignancy. A result letter of this screening mammogram will be mailed directly to the patient.  RECOMMENDATION: Screening mammogram in one year. (Code:[id number]-01Y)  BI-RADS CATEGORY 1: Negative.   Electronically Signed  By:  Lillia Mountain M.D.  On: 11/22/2015 13:10   PATHOLOGY:  NONE  ASSESSMENT/PLAN:   Normocytic normochromic anemia History of iron deficiency Intolerance to oral iron Low B12 Colonoscopy 04/05/2015 [facility] Dr. Laural Golden  Minimal normocytic, normochromic anemia dating back to 09/2014. She has received IV iron with excellent tolerance.  Hemoglobin has normalized.   Intolerant to ferrous sulfate with abdominal pain/bloating/constipation.  I will refer her back to Dr. Laural Golden for her nausea.  Her B12 is borderline low, so we will schedule her for four B12 injections, following with sublingual treatment. We will also treat her with iron infusions as needed.  We will recheck her bloodwork with CBC and iron in 8 weeks. She will return to the [facility] in 4 months for follow-up, and we will repeat labs at that time.  Orders Placed This Encounter  Procedures  . CBC with Differential    Standing Status: Future     Number of Occurrences:      Standing Expiration Date: 01/13/2017  . Ferritin    Standing Status: Future     Number of Occurrences:      Standing Expiration Date: 01/13/2017   All questions were answered. The patient knows to call the clinic with any problems, questions or concerns. We can certainly see the patient much sooner if necessary.  This document serves as a record of services personally performed by Ancil Linsey, MD. It was created on her behalf by Toni Amend, a trained medical scribe. The creation of this record is based on the scribe's personal observations and the provider's statements to them. This document has been checked and approved by the attending provider.  I have reviewed the above documentation for accuracy and completeness and I agree with the above.  This note is electronically signed by:  Molli Hazard, MD  01/14/2016 11:31 AM

## 2016-01-17 ENCOUNTER — Other Ambulatory Visit (INDEPENDENT_AMBULATORY_CARE_PROVIDER_SITE_OTHER): Payer: Self-pay | Admitting: *Deleted

## 2016-01-17 ENCOUNTER — Encounter (INDEPENDENT_AMBULATORY_CARE_PROVIDER_SITE_OTHER): Payer: Self-pay | Admitting: *Deleted

## 2016-01-17 DIAGNOSIS — R110 Nausea: Secondary | ICD-10-CM

## 2016-01-18 ENCOUNTER — Telehealth: Payer: Self-pay

## 2016-01-18 NOTE — Telephone Encounter (Signed)
crestor would cause muscle not joint pain, joint pain is arthritis. No testing for rheuamtoid arthritis, that would normally be dione by a rheumatologist, pls let her know

## 2016-01-18 NOTE — Telephone Encounter (Signed)
Called and left message for patient to return call also left advice on voicemail.

## 2016-01-20 ENCOUNTER — Encounter (HOSPITAL_BASED_OUTPATIENT_CLINIC_OR_DEPARTMENT_OTHER): Payer: 59

## 2016-01-20 VITALS — BP 155/76 | HR 79 | Temp 98.4°F | Resp 20

## 2016-01-20 DIAGNOSIS — E538 Deficiency of other specified B group vitamins: Secondary | ICD-10-CM

## 2016-01-20 DIAGNOSIS — D649 Anemia, unspecified: Secondary | ICD-10-CM

## 2016-01-20 MED ORDER — CYANOCOBALAMIN 1000 MCG/ML IJ SOLN
INTRAMUSCULAR | Status: AC
  Filled 2016-01-20: qty 1

## 2016-01-20 MED ORDER — CYANOCOBALAMIN 1000 MCG/ML IJ SOLN
1000.0000 ug | Freq: Once | INTRAMUSCULAR | Status: AC
  Administered 2016-01-20: 1000 ug via INTRAMUSCULAR

## 2016-01-20 NOTE — Progress Notes (Signed)
Kendra Hanson presents today for injection per MD orders. B12 1000 mcg administered IM in right Upper Arm. Administration without incident. Patient tolerated well.

## 2016-01-20 NOTE — Patient Instructions (Signed)
[facility] at [facility] Discharge Instructions  RECOMMENDATIONS MADE BY THE CONSULTANT AND ANY TEST RESULTS WILL BE SENT TO YOUR REFERRING PHYSICIAN.  Vitamin B12 1000 mcg injection given as ordered.  Thank you for choosing [facility] at [facility] to provide your oncology and hematology care.  To afford each patient quality time with our provider, please arrive at least 15 minutes before your scheduled appointment time.   Beginning January 23rd 2017 lab work for the Cancer Center will be done in the  Main lab at [facility] on 1st floor. If you have a lab appointment with the Cancer Center please come in thru the  Main Entrance and check in at the main information desk  You need to re-schedule your appointment should you arrive 10 or more minutes late.  We strive to give you quality time with our providers, and arriving late affects you and other patients whose appointments are after yours.  Also, if you no show three or more times for appointments you may be dismissed from the clinic at the providers discretion.     Again, thank you for choosing [facility].  Our hope is that these requests will decrease the amount of time that you wait before being seen by our physicians.       _____________________________________________________________  Should you have questions after your visit to  Cancer Center, please contact our office at [phone] between the hours of 8:30 a.m. and 4:30 p.m.  Voicemails left after 4:30 p.m. will not be returned until the following business day.  For prescription refill requests, have your pharmacy contact our office.         Resources For Cancer Patients and their Caregivers ? American Cancer Society: Can assist with transportation, wigs, general needs, runs Look Good Feel Better.        1-888-227-6333 ? Cancer Care: Provides financial assistance, online support groups,  medication/co-pay assistance.  1-800-813-HOPE (4673) ? [facility] Assists [place] cancer patients and their families through emotional , educational and financial support.  336-427-4357 ? [place] DSS Where to apply for food stamps, Medicaid and utility assistance. 336-342-1394 ? [facility]: Transportation to medical appointments. 336-347-2287 ? Social Security Administration: May apply for disability if have a Stage IV cancer. 336-342-7796 1-800-772-1213 ? [place] Aging, Disability and Transit Services: Assists with nutrition, care and transit needs. 336-349-2343 

## 2016-01-21 ENCOUNTER — Ambulatory Visit (HOSPITAL_COMMUNITY)
Admission: RE | Admit: 2016-01-21 | Discharge: 2016-01-21 | Disposition: A | Discharge status: Home / Self Care | Payer: 59 | Source: Ambulatory Visit | Attending: Internal Medicine | Admitting: Internal Medicine

## 2016-01-21 ENCOUNTER — Encounter (HOSPITAL_COMMUNITY): Payer: Self-pay | Admitting: *Deleted

## 2016-01-21 ENCOUNTER — Encounter (HOSPITAL_COMMUNITY)
Admission: RE | Disposition: A | Discharge status: Home / Self Care | Payer: Self-pay | Source: Ambulatory Visit | Attending: Internal Medicine

## 2016-01-21 DIAGNOSIS — R110 Nausea: Secondary | ICD-10-CM | POA: Insufficient documentation

## 2016-01-21 DIAGNOSIS — K219 Gastro-esophageal reflux disease without esophagitis: Secondary | ICD-10-CM | POA: Diagnosis not present

## 2016-01-21 DIAGNOSIS — K317 Polyp of stomach and duodenum: Secondary | ICD-10-CM | POA: Insufficient documentation

## 2016-01-21 DIAGNOSIS — E119 Type 2 diabetes mellitus without complications: Secondary | ICD-10-CM | POA: Diagnosis not present

## 2016-01-21 DIAGNOSIS — Z79899 Other long term (current) drug therapy: Secondary | ICD-10-CM | POA: Insufficient documentation

## 2016-01-21 DIAGNOSIS — E785 Hyperlipidemia, unspecified: Secondary | ICD-10-CM | POA: Diagnosis not present

## 2016-01-21 DIAGNOSIS — Z794 Long term (current) use of insulin: Secondary | ICD-10-CM | POA: Insufficient documentation

## 2016-01-21 DIAGNOSIS — G4730 Sleep apnea, unspecified: Secondary | ICD-10-CM | POA: Diagnosis not present

## 2016-01-21 DIAGNOSIS — E669 Obesity, unspecified: Secondary | ICD-10-CM | POA: Insufficient documentation

## 2016-01-21 DIAGNOSIS — I10 Essential (primary) hypertension: Secondary | ICD-10-CM | POA: Insufficient documentation

## 2016-01-21 HISTORY — PX: ESOPHAGOGASTRODUODENOSCOPY: SHX5428

## 2016-01-21 LAB — GLUCOSE, CAPILLARY: Glucose-Capillary: 146 mg/dL — ABNORMAL HIGH (ref 65–99)

## 2016-01-21 SURGERY — EGD (ESOPHAGOGASTRODUODENOSCOPY)
Anesthesia: Moderate Sedation

## 2016-01-21 MED ORDER — PROMETHAZINE HCL 25 MG/ML IJ SOLN
INTRAMUSCULAR | Status: DC | PRN
  Administered 2016-01-21: 12.5 mg via INTRAVENOUS

## 2016-01-21 MED ORDER — ONDANSETRON HCL 4 MG PO TABS: 4.0000 mg | ORAL_TABLET | Freq: Two times a day (BID) | ORAL | Status: DC | PRN

## 2016-01-21 MED ORDER — SODIUM CHLORIDE 0.9 % IV SOLN
INTRAVENOUS | Status: DC
  Administered 2016-01-21: 1000 mL via INTRAVENOUS

## 2016-01-21 MED ORDER — MIDAZOLAM HCL 5 MG/5ML IJ SOLN
INTRAMUSCULAR | Status: AC
  Filled 2016-01-21: qty 10

## 2016-01-21 MED ORDER — MEPERIDINE HCL 50 MG/ML IJ SOLN
INTRAMUSCULAR | Status: DC | PRN
  Administered 2016-01-21 (×2): 25 mg

## 2016-01-21 MED ORDER — BUTAMBEN-TETRACAINE-BENZOCAINE 2-2-14 % EX AERO
INHALATION_SPRAY | CUTANEOUS | Status: DC | PRN
  Administered 2016-01-21: 2 via TOPICAL

## 2016-01-21 MED ORDER — PROMETHAZINE HCL 25 MG/ML IJ SOLN
INTRAMUSCULAR | Status: AC
  Filled 2016-01-21: qty 1

## 2016-01-21 MED ORDER — MEPERIDINE HCL 50 MG/ML IJ SOLN
INTRAMUSCULAR | Status: AC
  Filled 2016-01-21: qty 1

## 2016-01-21 MED ORDER — MIDAZOLAM HCL 5 MG/5ML IJ SOLN
INTRAMUSCULAR | Status: DC | PRN
  Administered 2016-01-21: 3 mg via INTRAVENOUS
  Administered 2016-01-21 (×2): 2 mg via INTRAVENOUS

## 2016-01-21 MED ORDER — SODIUM CHLORIDE 0.9% FLUSH
INTRAVENOUS | Status: AC
  Filled 2016-01-21: qty 10

## 2016-01-21 MED FILL — ONDANSETRON HCL 4 MG TABLET: 4 | 15 days supply | Qty: 30 | Fill #0

## 2016-01-21 NOTE — H&P (Signed)
Kendra Hanson is an 55 y.o. female.   Chief Complaint: Patient is here for EGD. HPI: Patient is 55 year old female who has history of iron deficiency anemia and underwent colonoscopy in May 2016 each was normal other than hemorrhoids. Her stools have always been negative. She has responded to parenteral iron. She has chronic GERD. Heartburns well controlled with therapy. She has been on PPI for more than 5 years. She has recurrent nausea. She also had an episode of vomiting last week. Recall she had EGD more than 30 years ago and was treated with Reglan by Dr. Humphrey Rolls. She takes ibuprofen and or Aleve sparingly. She rarely takes more than 4 or 5 doses per month. No history of diarrhea.  Past Medical History  Diagnosis Date  . Insulin dependent diabetes mellitus ([place]) 1996  . Hypertension 2008    Normal CBC and CMet in 2012; negative stress nuclear in 2006- patient asymptomatic  . Obesity   . Hyperlipidemia 2008    Lipid profile in 04/2011:136, 53, 43  . Multiple allergies     perennial  . Fibroids     Uterine  . PONV (postoperative nausea and vomiting)   . Sleep apnea     CPAP  . GERD (gastroesophageal reflux disease)   . Anemia   . Normocytic normochromic anemia 12/27/2015    Past Surgical History  Procedure Laterality Date  . Refractive surgery  2011    Bilateral, two seperate occasions first in 2006  . Retinal detachment surgery Bilateral 05/2005  . Dental surgery    . Colonoscopy N/A 04/05/2015    Procedure: COLONOSCOPY;  Surgeon: Rogene Houston, MD;  [facility];  Service: Endoscopy;  Laterality: N/A;  730    Family History  Problem Relation Age of Onset  . Hypertension Mother   . Diabetes Mother   . Kidney failure Mother   . Stroke Mother   . Kidney failure Father   . Diabetes Father   . Heart attack Father   . COPD Sister   . Diabetes Brother   . Diabetes Brother   . Diabetes Brother   . Diabetes Brother   . Diabetes Brother   . Hypertension Brother     Social History:  reports that she has never smoked. She has never used smokeless tobacco. She reports that she does not drink alcohol or use illicit drugs.  Allergies: No Known Allergies  Medications Prior to Admission  Medication Sig Dispense Refill  . CRESTOR 10 MG tablet TAKE 1 TABLET BY MOUTH ONCE DAILY AT BEDTIME 90 tablet 3  . ergocalciferol (VITAMIN D2) 50000 UNITS capsule Take 1 capsule (50,000 Units total) by mouth once a week. One capsule once weekly (Patient taking differently: Take 50,000 Units by mouth once a week. Take on Monday) 12 capsule 3  . estradiol (VIVELLE-DOT) 0.0375 MG/24HR Place 1 patch onto the skin 3 (three) times a week. Monday, Wednesday, and Saturday.  12  . exenatide (BYETTA) 10 MCG/0.04ML SOPN injection Inject 0.04 mLs (10 mcg total) into the skin 2 (two) times daily with a meal. 3 pen 3  . furosemide (LASIX) 20 MG tablet Take 1 tablet (20 mg total) by mouth 3 (three) times daily. (Patient taking differently: Take 20 mg by mouth 2 (two) times daily. ) 270 tablet 1  . gabapentin (NEURONTIN) 100 MG capsule Take 200 mg by mouth daily.    Marland Kitchen LANTUS SOLOSTAR 100 UNIT/ML Solostar Pen INJECT 60 UNITS UNDER THE SKIN DAILY 60 mL 12  .  losartan (COZAAR) 100 MG tablet TAKE 1 TABLET BY MOUTH DAILY. 90 tablet 1  . metFORMIN (GLUCOPHAGE) 1000 MG tablet TAKE 1 TABLET BY MOUTH 2 TIMES DAILY WITH A MEAL 180 tablet 1  . naproxen sodium (ANAPROX) 220 MG tablet Take 220 mg by mouth daily as needed (pain).    . NOVOLOG FLEXPEN 100 UNIT/ML FlexPen INJECT 12 UNITS IN THE MORNING, 10 UNITS AT LUNCHTIME , AND 12 UNITS AT BEDTIME AS DIRECTED 15 mL PRN  . pantoprazole (PROTONIX) 20 MG tablet TAKE 1 TABLET BY MOUTH EVERY DAY 90 tablet 1  . potassium chloride (K-DUR,KLOR-CON) 10 MEQ tablet Take 1 tablet (10 mEq total) by mouth 3 (three) times daily. (Patient taking differently: Take 10 mEq by mouth 2 (two) times daily. ) 270 tablet 1  . TAZTIA XT 360 MG 24 hr capsule TAKE 1 CAPSULE BY MOUTH  DAILY. 90 capsule 1  . fluticasone (FLONASE) 50 MCG/ACT nasal spray Place 2 sprays into both nostrils daily. (Patient taking differently: Place 2 sprays into both nostrils daily as needed for allergies. ) 48 g 1  . Lancet Device MISC Use with verio IQ meter three times daily to test blood sugar  100 each PRN  . loratadine (CLARITIN) 10 MG tablet Take 10 mg by mouth daily as needed for allergies.     Marland Kitchen UNIFINE PENTIPS 31G X 8 MM MISC USE AS DIRECTED 100 each 3    Results for orders placed or performed during the hospital encounter of 01/21/16 (from the past 48 hour(s))  Glucose, capillary     Status: Abnormal   Collection Time: 01/21/16 12:53 PM  Result Value Ref Range   Glucose-Capillary 146 (H) 65 - 99 mg/dL   No results found.  ROS  Blood pressure 123/64, pulse 83, resp. rate 12, SpO2 99 %. Physical Exam  Constitutional:  Obese African-American female in NAD.  HENT:  Mouth/Throat: Oropharynx is clear and moist.  Eyes: Conjunctivae are normal. No scleral icterus.  Neck: No thyromegaly present.  Cardiovascular: Normal rate, regular rhythm and normal heart sounds.   No murmur heard. Respiratory: Effort normal and breath sounds normal.  GI: Soft. She exhibits no distension and no mass. There is no tenderness.  Musculoskeletal: She exhibits no edema.  Lymphadenopathy:    She has no cervical adenopathy.  Neurological: She is alert.  Skin: Skin is warm and dry.     Assessment/Plan Recurrent nausea with sporadic vomiting. Chronic GERD. History of iron deficiency anemia and normal colonoscopy in May 2016. Diagnostic EGD.  Rogene Houston, MD 01/21/2016, 1:52 PM

## 2016-01-21 NOTE — Discharge Instructions (Signed)
Resume usual medications and diet. Ondansetron 4 mg by mouth twice a day when necessary. No driving for 24 hours. Patient will call with biopsy results.  Gastrointestinal Endoscopy, Care After Refer to this sheet in the next few weeks. These instructions provide you with information on caring for yourself after your procedure. Your caregiver may also give you more specific instructions. Your treatment has been planned according to current medical practices, but problems sometimes occur. Call your caregiver if you have any problems or questions after your procedure. HOME CARE INSTRUCTIONS  If you were given medicine to help you relax (sedative), do not drive, operate machinery, or sign important documents for 24 hours.  Avoid alcohol and hot or warm beverages for the first 24 hours after the procedure.  Only take over-the-counter or prescription medicines for pain, discomfort, or fever as directed by your caregiver. You may resume taking your normal medicines unless your caregiver tells you otherwise. Ask your caregiver when you may resume taking medicines that may cause bleeding, such as aspirin, clopidogrel, or warfarin.  You may return to your normal diet and activities on the day after your procedure, or as directed by your caregiver. Walking may help to reduce any bloated feeling in your abdomen.  Drink enough fluids to keep your urine clear or pale yellow.  You may gargle with salt water if you have a sore throat. SEEK IMMEDIATE MEDICAL CARE IF:  You have severe nausea or vomiting.  You have severe abdominal pain, abdominal cramps that last longer than 6 hours, or abdominal swelling (distention).  You have severe shoulder or back pain.  You have trouble swallowing.  You have shortness of breath, your breathing is shallow, or you are breathing faster than normal.  You have a fever or a rapid heartbeat.  You vomit blood or material that looks like coffee grounds.  You have  bloody, black, or tarry stools. MAKE SURE YOU:  Understand these instructions.  Will watch your condition.  Will get help right away if you are not doing well or get worse.   This information is not intended to replace advice given to you by your health care provider. Make sure you discuss any questions you have with your health care provider.   Document Released: 06/13/2004 Document Revised: 11/20/2014 Document Reviewed: 01/30/2012 Elsevier Interactive Patient Education [employer].

## 2016-01-21 NOTE — Op Note (Signed)
[facility] Patient Name: Kendra Hanson Procedure Date: 01/21/2016 1:30 PM MRN: [id number] Date of Birth: Dec 05, 1960 Attending MD: Hildred Laser , MD CSN: [id number] Age: 55 Admit Type: Ambulatory Procedure:                Upper GI endoscopy Indications:              Gastro-esophageal reflux disease Providers:                Hildred Laser, MD, Janeece Riggers, RN, Jeralyn Bennett, RN Referring MD:             Ancil Linsey (Referring MD), Norwood Levo.                            Moshe Cipro, MD (Referring MD) Medicines:                Cetacaine spray, Promethazine 12.5 mg IV,                            Meperidine 50 mg IV, Midazolam 7 mg IV Complications:            No immediate complications. Estimated Blood Loss:     Estimated blood loss was minimal. Procedure:                Pre-Anesthesia Assessment:                           - Prior to the procedure, a History and Physical                            was performed, and patient medications and                            allergies were reviewed. The patient's tolerance of                            previous anesthesia was also reviewed. The risks                            and benefits of the procedure and the sedation                            options and risks were discussed with the patient.                            All questions were answered, and informed consent                            was obtained. Prior Anticoagulants: The patient has                            taken no previous anticoagulant or antiplatelet  agents. ASA Grade Assessment: II - A patient with                            mild systemic disease. After reviewing the risks                            and benefits, the patient was deemed in                            satisfactory condition to undergo the procedure.                           After obtaining informed consent, the endoscope was          passed under direct vision. Throughout the                            procedure, the patient's blood pressure, pulse, and                            oxygen saturations were monitored continuously. The                            [id number] scope was introduced through the                            mouth, and advanced to the second part of duodenum.                            The upper GI endoscopy was accomplished without                            difficulty. The patient tolerated the procedure                            well. Scope In: 2:08:17 PM Scope Out: 2:18:59 PM Total Procedure Duration: 0 hours 10 minutes 42 seconds  Findings:      The examined esophagus was normal. GE junction at 40 cm from the       incisors.      Six 4 to 7 mm semi-sessile polyps with no bleeding and no stigmata of       recent bleeding were found in the stomach. Biopsies were taken with a       cold forceps for histology. Estimated blood loss was minimal. Estimated       blood loss was minimal.      Normal mucosa was found at the gastroesophageal junction, in the gastric       antrum and at the pylorus.      The duodenal bulb and second portion of the duodenum were normal.       Biopsies for histology were taken with a cold forceps for evaluation of       celiac disease. Impression:               - Normal esophagus.                           -  Six gastric polyps. Biopsied.                           - Normal mucosa was found in the gastroesophageal                            junction, in the antrum and in the pylorus.                           - Normal duodenal bulb and second portion of the                            duodenum. Biopsied. Moderate Sedation:      Moderate (conscious) sedation was administered by the endoscopy nurse       and supervised by the endoscopist. The following parameters were       monitored: oxygen saturation, heart rate, blood pressure, CO2       capnography and  response to care. Total physician intraservice time was       24 minutes. Recommendation:           - Patient has a contact number available for                            emergencies. The signs and symptoms of potential                            delayed complications were discussed with the                            patient. Return to normal activities tomorrow.                            Written discharge instructions were provided to the                            patient.                           - Resume previous diet today.                           - Continue present medications.                           - Use Zofran (ondansetron) 4 mg PR BID PRN.                           - Await pathology results. Procedure Code(s):        --- Professional ---                           [phone], Esophagogastroduodenoscopy, flexible,                            transoral; with biopsy, single or multiple  [id number], Moderate sedation services provided by the                            same physician or other qualified health care                            professional performing the diagnostic or                            therapeutic service that the sedation supports,                            requiring the presence of an independent trained                            observer to assist in the monitoring of the                            patient's level of consciousness and physiological                            status; initial 15 minutes of intraservice time,                            patient age 28 years or older                           [phone], Moderate sedation services; each additional                            15 minutes intraservice time Diagnosis Code(s):        --- Professional ---                           K31.7, Polyp of stomach and duodenum                           K21.9, Gastro-esophageal reflux disease without                            esophagitis CPT  copyright 2016 American Medical Association. All rights reserved. The codes documented in this report are preliminary and upon coder review may  be revised to meet current compliance requirements. Hildred Laser, MD Hildred Laser, MD 01/21/2016 2:59:45 PM This report has been signed electronically. Number of Addenda: 0

## 2016-01-26 ENCOUNTER — Other Ambulatory Visit: Payer: Self-pay | Admitting: Family Medicine

## 2016-01-27 ENCOUNTER — Encounter (HOSPITAL_BASED_OUTPATIENT_CLINIC_OR_DEPARTMENT_OTHER): Payer: 59

## 2016-01-27 VITALS — BP 143/59 | HR 87 | Temp 98.8°F | Resp 20

## 2016-01-27 DIAGNOSIS — E538 Deficiency of other specified B group vitamins: Secondary | ICD-10-CM

## 2016-01-27 DIAGNOSIS — D649 Anemia, unspecified: Secondary | ICD-10-CM

## 2016-01-27 MED ORDER — CYANOCOBALAMIN 1000 MCG/ML IJ SOLN
1000.0000 ug | Freq: Once | INTRAMUSCULAR | Status: AC
  Administered 2016-01-27: 1000 ug via INTRAMUSCULAR

## 2016-01-27 MED ORDER — CYANOCOBALAMIN 1000 MCG/ML IJ SOLN
INTRAMUSCULAR | Status: AC
  Filled 2016-01-27: qty 1

## 2016-01-27 MED FILL — VIT D2 1.25 MG (50,000 UNIT: 1.25 MG | 84 days supply | Qty: 12 | Fill #0

## 2016-01-27 MED FILL — ROSUVASTATIN CALCIUM 10 MG: 10 | 90 days supply | Qty: 90 | Fill #0

## 2016-01-27 NOTE — Progress Notes (Signed)
Kendra Hanson presents today for injection per MD orders. B12 1000 mcg administered IM in left Upper Arm. Administration without incident. Patient tolerated well.

## 2016-01-27 NOTE — Patient Instructions (Signed)
[facility] at [facility] Discharge Instructions  RECOMMENDATIONS MADE BY THE CONSULTANT AND ANY TEST RESULTS WILL BE SENT TO YOUR REFERRING PHYSICIAN.  Vitamin B12 1000 mcg injection week 3 of 4 given as ordered. Return as scheduled.  Thank you for choosing [facility] at [facility] to provide your oncology and hematology care.  To afford each patient quality time with our provider, please arrive at least 15 minutes before your scheduled appointment time.   Beginning January 23rd 2017 lab work for the Cancer Center will be done in the  Main lab at [facility] on 1st floor. If you have a lab appointment with the Cancer Center please come in thru the  Main Entrance and check in at the main information desk  You need to re-schedule your appointment should you arrive 10 or more minutes late.  We strive to give you quality time with our providers, and arriving late affects you and other patients whose appointments are after yours.  Also, if you no show three or more times for appointments you may be dismissed from the clinic at the providers discretion.     Again, thank you for choosing [facility].  Our hope is that these requests will decrease the amount of time that you wait before being seen by our physicians.       _____________________________________________________________  Should you have questions after your visit to [facility], please contact our office at [phone] between the hours of 8:30 a.m. and 4:30 p.m.  Voicemails left after 4:30 p.m. will not be returned until the following business day.  For prescription refill requests, have your pharmacy contact our office.         Resources For Cancer Patients and their Caregivers ? American Cancer Society: Can assist with transportation, wigs, general needs, runs Look Good Feel Better.        1-888-227-6333 ? Cancer Care: Provides financial assistance,  online support groups, medication/co-pay assistance.  1-800-813-HOPE (4673) ? [facility] Assists [place] cancer patients and their families through emotional , educational and financial support.  336-427-4357 ? [place] DSS Where to apply for food stamps, Medicaid and utility assistance. 336-342-1394 ? RCATS: Transportation to medical appointments. 336-347-2287 ? Social Security Administration: May apply for disability if have a Stage IV cancer. 336-342-7796 1-800-772-1213 ? [place] Aging, Disability and Transit Services: Assists with nutrition, care and transit needs. 336-349-2343 

## 2016-01-31 ENCOUNTER — Other Ambulatory Visit: Payer: Self-pay | Admitting: Family Medicine

## 2016-01-31 ENCOUNTER — Ambulatory Visit (INDEPENDENT_AMBULATORY_CARE_PROVIDER_SITE_OTHER): Payer: 59 | Admitting: Internal Medicine

## 2016-02-01 ENCOUNTER — Encounter (HOSPITAL_COMMUNITY): Payer: Self-pay | Admitting: Internal Medicine

## 2016-02-01 MED FILL — FLUCONAZOLE 150 MG TABLET: 150 | 7 days supply | Qty: 2 | Fill #0

## 2016-02-02 MED FILL — PREMARIN VAGINAL CREAM-APPL: 0.625 | 90 days supply | Qty: 30 | Fill #1

## 2016-02-03 ENCOUNTER — Encounter (HOSPITAL_BASED_OUTPATIENT_CLINIC_OR_DEPARTMENT_OTHER): Payer: 59

## 2016-02-03 DIAGNOSIS — E538 Deficiency of other specified B group vitamins: Secondary | ICD-10-CM | POA: Diagnosis not present

## 2016-02-03 MED ORDER — CYANOCOBALAMIN 1000 MCG/ML IJ SOLN
1000.0000 ug | Freq: Once | INTRAMUSCULAR | Status: AC
Start: 2016-02-03 — End: 2016-02-03
  Administered 2016-02-03: 1000 ug via INTRAMUSCULAR
  Filled 2016-02-03: qty 1

## 2016-02-03 NOTE — Patient Instructions (Signed)
[place] at [facility] Discharge Instructions  RECOMMENDATIONS MADE BY THE CONSULTANT AND ANY TEST RESULTS WILL BE SENT TO YOUR REFERRING PHYSICIAN.  Vitamin B12 1000 mcg injection week 4 of 4 given today as ordered.  Thank you for choosing [place] at [facility] to provide your oncology and hematology care.  To afford each patient quality time with our provider, please arrive at least 15 minutes before your scheduled appointment time.   Beginning January 23rd 2017 lab work for the [facility] will be done in the  Main lab at [facility] on 1st floor. If you have a lab appointment with the [facility] please come in thru the  Main Entrance and check in at the main information desk  You need to re-schedule your appointment should you arrive 10 or more minutes late.  We strive to give you quality time with our providers, and arriving late affects you and other patients whose appointments are after yours.  Also, if you no show three or more times for appointments you may be dismissed from the clinic at the providers discretion.     Again, thank you for choosing [facility].  Our hope is that these requests will decrease the amount of time that you wait before being seen by our physicians.       _____________________________________________________________  Should you have questions after your visit to [facility], please contact our office at [phone] between the hours of 8:30 a.m. and 4:30 p.m.  Voicemails left after 4:30 p.m. will not be returned until the following business day.  For prescription refill requests, have your pharmacy contact our office.         Resources For Cancer Patients and their Caregivers ? American Cancer Society: Can assist with transportation, wigs, general needs, runs Look Good Feel Better.        (480) 290-5051 ? Cancer Care: Provides financial assistance, online support  groups, medication/co-pay assistance.  1-800-813-HOPE (680)860-3647) ? [facility] Assists [place] cancer patients and their families through emotional , educational and financial support.  509-762-0289 ? [place] DSS Where to apply for food stamps, Medicaid and utility assistance. (516)371-6175 ? RCATS: Transportation to medical appointments. 530-469-3941 ? Social Security Administration: May apply for disability if have a Stage IV cancer. 214-802-7898 928-475-4465 ? [employer], Disability and Transit Services: Assists with nutrition, care and transit needs. 667-378-7238

## 2016-02-03 NOTE — Progress Notes (Signed)
Kendra Hanson presents today for injection per MD orders. B12 1000 mcg administered IM in right Upper Arm. Administration without incident. Patient tolerated well.

## 2016-02-15 ENCOUNTER — Other Ambulatory Visit: Payer: Self-pay | Admitting: Family Medicine

## 2016-02-15 ENCOUNTER — Telehealth: Payer: Self-pay | Admitting: Family Medicine

## 2016-02-15 MED ORDER — BENZONATATE 100 MG PO CAPS: 100.0000 mg | ORAL_CAPSULE | Freq: Two times a day (BID) | ORAL | Status: DC | PRN

## 2016-02-15 MED ORDER — AZITHROMYCIN 250 MG PO TABS: ORAL_TABLET | ORAL | Status: DC

## 2016-02-15 MED FILL — BYETTA 10 MCG DOSE PEN INJ: 10 | 90 days supply | Qty: 7 | Fill #1

## 2016-02-15 MED FILL — ESTRADIOL PATCH 0.0375: 0.0375 | 84 days supply | Qty: 24 | Fill #7

## 2016-02-15 MED FILL — GABAPENTIN 100 MG CAPSULE: 100 | 30 days supply | Qty: 90 | Fill #1

## 2016-02-15 MED FILL — NOVOLOG FLEXPEN SYRINGE: 100 | 46 days supply | Qty: 15 | Fill #1

## 2016-02-15 MED FILL — metFORMIN HCL 1000 MG TABS: 1000 | 90 days supply | Qty: 180 | Fill #1

## 2016-02-15 MED FILL — BENZONATATE 100 MG CAPSULE: 100 | 10 days supply | Qty: 20 | Fill #0

## 2016-02-15 MED FILL — AZITHROMYCIN 250 MG TABLET: 250 | 5 days supply | Qty: 6 | Fill #0

## 2016-02-15 MED FILL — LANTUS SOLOSTAR 100 UNITS/M: 100 | 90 days supply | Qty: 54 | Fill #1

## 2016-02-15 MED FILL — FUROSEMIDE 20 MG TABLET: 20 | 90 days supply | Qty: 270 | Fill #1

## 2016-02-15 NOTE — Telephone Encounter (Signed)
Tessalon perles and z pack sent pls let her know

## 2016-02-15 NOTE — Telephone Encounter (Signed)
Pt c/o coughing up clear mucus, ears hurting, bodyaches x 3 days. Had a fever over the weekend but it broke. Some sore throat, but no fever today. Just feels bad. [facility] tomorrow. Please advise

## 2016-02-15 NOTE — Telephone Encounter (Signed)
Patient is stating that she may have possible flu, she is coughing, sorethroat, chills and she is asking if she can get something called in, please advise?

## 2016-02-16 MED FILL — UNIFINE PENTIPS 8MM 31G: 31G X 8 MM | 90 days supply | Qty: 100 | Fill #0

## 2016-02-16 MED FILL — LOSARTAN POTASSIUM 100 MG T: 100 | 90 days supply | Qty: 90 | Fill #0

## 2016-02-16 NOTE — Telephone Encounter (Signed)
Pt aware.

## 2016-02-28 ENCOUNTER — Telehealth: Payer: Self-pay | Admitting: Family Medicine

## 2016-02-28 NOTE — Telephone Encounter (Signed)
Patient states that she has a blister on right 4th toe she believes is from friction from shoe.  She also has mild swelling to that toe.   Blister is not open and is not draining that she has noticed.  Is using neosporin on toe and leaving open to air.  Please advise further.

## 2016-02-28 NOTE — Telephone Encounter (Signed)
Continue what she is doing. Do NOT open/ pop blister. Wear a shoe which does not rub / put pressure on blister, when she does have on closed shoe apply dry padding like gauze to area to reduce the friction. Correct fitting footwear is the KEY too preventing this type of problem

## 2016-02-28 NOTE — Telephone Encounter (Signed)
Patient is asking to see Dr. Moshe Cipro, she has a blister on the side of her toe and she is worried about it being a diabetic, please advise?

## 2016-02-28 NOTE — Telephone Encounter (Signed)
Called and left message for patient to return call.  

## 2016-02-28 NOTE — Telephone Encounter (Signed)
Called and left message for patient notifying of advice.  

## 2016-02-29 ENCOUNTER — Telehealth: Payer: Self-pay | Admitting: Family Medicine

## 2016-02-29 ENCOUNTER — Encounter: Payer: Self-pay | Admitting: Podiatry

## 2016-02-29 ENCOUNTER — Ambulatory Visit (INDEPENDENT_AMBULATORY_CARE_PROVIDER_SITE_OTHER): Payer: 59 | Admitting: Podiatry

## 2016-02-29 VITALS — BP 139/52 | HR 89 | Resp 16

## 2016-02-29 DIAGNOSIS — L02611 Cutaneous abscess of right foot: Secondary | ICD-10-CM | POA: Diagnosis not present

## 2016-02-29 DIAGNOSIS — R52 Pain, unspecified: Secondary | ICD-10-CM

## 2016-02-29 MED ORDER — DOXYCYCLINE HYCLATE 100 MG PO TABS: 100.0000 mg | ORAL_TABLET | Freq: Two times a day (BID) | ORAL | Status: DC

## 2016-02-29 MED FILL — DOXYCYCLINE HYCLATE 100 MG: 100 | 10 days supply | Qty: 20 | Fill #0

## 2016-02-29 NOTE — Telephone Encounter (Signed)
Pls call pt and let her know that I see where she saw podiatry re the blister on her toe, which is a good thing , since from the note , it was infected. We did leave a message asking her to call back, she had sent in a note , but she sought the specialist attention which she did need, which is good, and we did not get a return call Pls explain needs fasting lipid, cmp and EGFr, hBa1C, TSH and vit D also microalb in  Mid May and also a f/u appt at that time, she has no appt in system, so please also give her an appt Thanks  ??

## 2016-02-29 NOTE — Progress Notes (Signed)
Subjective:     [name], female   DOB: [birth year], 55 y.o.   MRN: [id number]  HPI this patient presents to the office with chief complaint of painful blister on the fourth toe of the right foot. She states that she was exercising about a week ago and developed a blister on the fourth toe. She contacted her medical doctor who told her to watch the blister this patient is diabetic and she is concerned about the blister formation and proceeded to make an appointment for this office. She says her fourth toe has become red and painful walking and wearing her shoes. She has provided no self treatment nor sought any professional help. She presents the office today for definitive evaluation and treatment of this blister fourth toe, right   Review of Systems     Objective:   Physical Exam GENERAL APPEARANCE: Alert, conversant. Appropriately groomed. No acute distress.  VASCULAR: Pedal pulses are  palpable at  [facility] and PT bilateral.  Capillary refill time is immediate to all digits,  Normal temperature gradient.  Digital hair growth is present bilateral  NEUROLOGIC: sensation is normal to 5.07 monofilament at 5/5 sites bilateral.  Light touch is intact bilateral, Muscle strength normal.  MUSCULOSKELETAL: acceptable muscle strength, tone and stability bilateral.  Intrinsic muscluature intact bilateral.  Rectus appearance of foot and digits noted bilateral.   DERMATOLOGIC: skin color, texture, and turgor are within normal limits.  No preulcerative lesions or ulcers  are seen, no interdigital maceration noted.  No open lesions present.  . No drainage noted. There is a fluctuant blister fourth toe right foot with the blister extending into the floor of the fourth interspace.Mild redness and inflammation at the dorsal base fourth toe right foot. 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      Assessment:     Infected blister fourth toe right foot.     Plan:    ROV  Incision and drainage infected blister right foot.  Culture sent to lab.  Neosporin/DSD   RTC 1 week.  Home soaks. Doxycycline # 20 prescribed.   Gardiner Barefoot DPM

## 2016-03-01 ENCOUNTER — Encounter: Payer: Self-pay | Admitting: Family Medicine

## 2016-03-02 NOTE — Telephone Encounter (Signed)
Dr Moshe Cipro spoke directly with patient

## 2016-03-07 ENCOUNTER — Ambulatory Visit (INDEPENDENT_AMBULATORY_CARE_PROVIDER_SITE_OTHER): Payer: 59 | Admitting: Podiatry

## 2016-03-07 ENCOUNTER — Encounter: Payer: Self-pay | Admitting: Podiatry

## 2016-03-07 VITALS — BP 140/53 | HR 79 | Resp 16

## 2016-03-07 DIAGNOSIS — L02611 Cutaneous abscess of right foot: Secondary | ICD-10-CM

## 2016-03-07 NOTE — Progress Notes (Signed)
Subjective:     [name], female   DOB: 1961-03-04, 55 y.o.   MRN: [id number]  HPI this patient presents to the office 1 week after diagnosis was made of an infected blister fourth toe, right foot. She was treated with an incision and drainage of the blister right foot. She was prescribed doxycycline and home soaks. She presents the office today stating that she has been care dry to her right foot at home and placing Neosporin in the fourth interdigital space. She says that she is now experiencing no pain or discomfort. This patient is a diabetic and returns to the office for continued evaluation and treatment for her right foot   Review of Systems     Objective:   Physical Exam GENERAL APPEARANCE: Alert, conversant. Appropriately groomed. No acute distress.  VASCULAR: Pedal pulses are  palpable at  [facility] and PT bilateral.  Capillary refill time is immediate to all digits,  Normal temperature gradient.  Digital hair growth is present bilateral  NEUROLOGIC: sensation is normal to 5.07 monofilament at 5/5 sites bilateral.  Light touch is intact bilateral, Muscle strength normal.  MUSCULOSKELETAL: acceptable muscle strength, tone and stability bilateral.  Intrinsic muscluature intact bilateral.  Rectus appearance of foot and digits noted bilateral.   DERMATOLOGIC: skin color, texture, and turgor are within normal limits.  There is no evidence of redness or swelling or pain fourth toe right foot.  There is necrotic skin in the fourth interdigital space right foot.  There is no redness or swelling noted. Elongated nails noted B/L.      Assessment:     S/p infected blister.     Plan:     ROV  Debride necrotic tissue in fourth interdigital space and painted with Castellani solution.  Told her to use at home.  D/C neosporin ointment.  RTC prn.   Gardiner Barefoot DPM

## 2016-03-10 ENCOUNTER — Encounter (HOSPITAL_COMMUNITY): Payer: 59 | Attending: Hematology & Oncology

## 2016-03-10 ENCOUNTER — Other Ambulatory Visit (HOSPITAL_COMMUNITY): Payer: Self-pay | Admitting: Oncology

## 2016-03-10 ENCOUNTER — Other Ambulatory Visit (HOSPITAL_COMMUNITY)
Admission: RE | Admit: 2016-03-10 | Discharge: 2016-03-10 | Disposition: A | Discharge status: Home / Self Care | Payer: 59 | Source: Ambulatory Visit | Attending: Family Medicine | Admitting: Family Medicine

## 2016-03-10 DIAGNOSIS — Z9889 Other specified postprocedural states: Secondary | ICD-10-CM | POA: Insufficient documentation

## 2016-03-10 DIAGNOSIS — D649 Anemia, unspecified: Secondary | ICD-10-CM | POA: Insufficient documentation

## 2016-03-10 DIAGNOSIS — Z7984 Long term (current) use of oral hypoglycemic drugs: Secondary | ICD-10-CM | POA: Insufficient documentation

## 2016-03-10 DIAGNOSIS — I10 Essential (primary) hypertension: Secondary | ICD-10-CM | POA: Diagnosis not present

## 2016-03-10 DIAGNOSIS — E1122 Type 2 diabetes mellitus with diabetic chronic kidney disease: Secondary | ICD-10-CM | POA: Diagnosis not present

## 2016-03-10 DIAGNOSIS — E119 Type 2 diabetes mellitus without complications: Secondary | ICD-10-CM | POA: Diagnosis not present

## 2016-03-10 DIAGNOSIS — D509 Iron deficiency anemia, unspecified: Secondary | ICD-10-CM | POA: Diagnosis not present

## 2016-03-10 DIAGNOSIS — Z833 Family history of diabetes mellitus: Secondary | ICD-10-CM | POA: Diagnosis not present

## 2016-03-10 DIAGNOSIS — E611 Iron deficiency: Secondary | ICD-10-CM

## 2016-03-10 DIAGNOSIS — E559 Vitamin D deficiency, unspecified: Secondary | ICD-10-CM | POA: Insufficient documentation

## 2016-03-10 DIAGNOSIS — E785 Hyperlipidemia, unspecified: Secondary | ICD-10-CM | POA: Diagnosis not present

## 2016-03-10 DIAGNOSIS — K219 Gastro-esophageal reflux disease without esophagitis: Secondary | ICD-10-CM | POA: Diagnosis not present

## 2016-03-10 LAB — CBC WITH DIFFERENTIAL/PLATELET
Basophils Absolute: 0.1 10*3/uL (ref 0.0–0.1)
Basophils Relative: 1 %
Eosinophils Absolute: 0.3 10*3/uL (ref 0.0–0.7)
Eosinophils Relative: 3 %
HCT: 34.9 % — ABNORMAL LOW (ref 36.0–46.0)
Hemoglobin: 11.4 g/dL — ABNORMAL LOW (ref 12.0–15.0)
Lymphocytes Relative: 26 %
Lymphs Abs: 2.3 10*3/uL (ref 0.7–4.0)
MCH: 29.1 pg (ref 26.0–34.0)
MCHC: 32.7 g/dL (ref 30.0–36.0)
MCV: 89 fL (ref 78.0–100.0)
Monocytes Absolute: 0.4 10*3/uL (ref 0.1–1.0)
Monocytes Relative: 5 %
Neutro Abs: 6 10*3/uL (ref 1.7–7.7)
Neutrophils Relative %: 65 %
Platelets: 306 10*3/uL (ref 150–400)
RBC: 3.92 MIL/uL (ref 3.87–5.11)
RDW: 14.1 % (ref 11.5–15.5)
WBC: 9.1 10*3/uL (ref 4.0–10.5)

## 2016-03-10 LAB — COMPREHENSIVE METABOLIC PANEL
ALT: 13 U/L — ABNORMAL LOW (ref 14–54)
AST: 13 U/L — ABNORMAL LOW (ref 15–41)
Albumin: 3.7 g/dL (ref 3.5–5.0)
Alkaline Phosphatase: 82 U/L (ref 38–126)
Anion gap: 8 (ref 5–15)
BUN: 19 mg/dL (ref 6–20)
CO2: 27 mmol/L (ref 22–32)
Calcium: 8.7 mg/dL — ABNORMAL LOW (ref 8.9–10.3)
Chloride: 102 mmol/L (ref 101–111)
Creatinine, Ser: 1.04 mg/dL — ABNORMAL HIGH (ref 0.44–1.00)
GFR calc Af Amer: >60 mL/min (ref >60)
GFR calc non Af Amer: 59 mL/min — ABNORMAL LOW (ref >60)
Glucose, Bld: 123 mg/dL — ABNORMAL HIGH (ref 65–99)
Potassium: 4.3 mmol/L (ref 3.5–5.1)
Sodium: 137 mmol/L (ref 135–145)
Total Bilirubin: 0.5 mg/dL (ref 0.3–1.2)
Total Protein: 7.5 g/dL (ref 6.5–8.1)

## 2016-03-10 LAB — LIPID PANEL
Cholesterol: 140 mg/dL (ref 0–200)
HDL: 46 mg/dL (ref >40)
LDL Cholesterol: 85 mg/dL (ref 0–99)
Total CHOL/HDL Ratio: 3 RATIO
Triglycerides: 47 mg/dL (ref <150)
VLDL: 9 mg/dL (ref 0–40)

## 2016-03-10 LAB — FERRITIN: Ferritin: 86 ng/mL (ref 11–307)

## 2016-03-11 LAB — MICROALBUMIN / CREATININE URINE RATIO
Creatinine, Urine: 134.4 mg/dL
Microalb Creat Ratio: 82.1 mg/g creat — ABNORMAL HIGH (ref 0.0–30.0)
Microalb, Ur: 110.4 ug/mL — ABNORMAL HIGH

## 2016-03-11 LAB — VITAMIN D 25 HYDROXY (VIT D DEFICIENCY, FRACTURES): Vit D, 25-Hydroxy: 52.5 ng/mL (ref 30.0–100.0)

## 2016-03-11 LAB — HEMOGLOBIN A1C
Hgb A1c MFr Bld: 7.7 % — ABNORMAL HIGH (ref 4.8–5.6)
Mean Plasma Glucose: 174 mg/dL

## 2016-03-21 ENCOUNTER — Encounter (HOSPITAL_COMMUNITY): Payer: Self-pay

## 2016-03-21 ENCOUNTER — Encounter (HOSPITAL_COMMUNITY): Payer: 59 | Attending: Hematology & Oncology

## 2016-03-21 VITALS — BP 133/69 | HR 80 | Temp 98.2°F | Resp 16

## 2016-03-21 DIAGNOSIS — Z9889 Other specified postprocedural states: Secondary | ICD-10-CM | POA: Insufficient documentation

## 2016-03-21 DIAGNOSIS — D649 Anemia, unspecified: Secondary | ICD-10-CM | POA: Insufficient documentation

## 2016-03-21 DIAGNOSIS — Z7984 Long term (current) use of oral hypoglycemic drugs: Secondary | ICD-10-CM | POA: Insufficient documentation

## 2016-03-21 DIAGNOSIS — E785 Hyperlipidemia, unspecified: Secondary | ICD-10-CM | POA: Insufficient documentation

## 2016-03-21 DIAGNOSIS — K219 Gastro-esophageal reflux disease without esophagitis: Secondary | ICD-10-CM | POA: Insufficient documentation

## 2016-03-21 DIAGNOSIS — E611 Iron deficiency: Secondary | ICD-10-CM

## 2016-03-21 DIAGNOSIS — I10 Essential (primary) hypertension: Secondary | ICD-10-CM | POA: Insufficient documentation

## 2016-03-21 DIAGNOSIS — D509 Iron deficiency anemia, unspecified: Secondary | ICD-10-CM | POA: Insufficient documentation

## 2016-03-21 DIAGNOSIS — E119 Type 2 diabetes mellitus without complications: Secondary | ICD-10-CM | POA: Insufficient documentation

## 2016-03-21 DIAGNOSIS — Z833 Family history of diabetes mellitus: Secondary | ICD-10-CM | POA: Insufficient documentation

## 2016-03-21 MED ORDER — SODIUM CHLORIDE 0.9 % IV SOLN
510.0000 mg | Freq: Once | INTRAVENOUS | Status: AC
  Administered 2016-03-21: 510 mg via INTRAVENOUS
  Filled 2016-03-21: qty 17

## 2016-03-21 MED ORDER — SODIUM CHLORIDE 0.9 % IV SOLN
INTRAVENOUS | Status: DC
  Administered 2016-03-21: 15:00:00 via INTRAVENOUS

## 2016-03-21 NOTE — Patient Instructions (Signed)
[facility] at [facility] Discharge Instructions  RECOMMENDATIONS MADE BY THE CONSULTANT AND ANY TEST RESULTS WILL BE SENT TO YOUR REFERRING PHYSICIAN.    Iron infusion today Follow up as scheduled   Thank you for choosing [facility] at [facility] to provide your oncology and hematology care.  To afford each patient quality time with our provider, please arrive at least 15 minutes before your scheduled appointment time.   Beginning January 23rd 2017 lab work for the Cancer Center will be done in the  Main lab at [facility] on 1st floor. If you have a lab appointment with the Cancer Center please come in thru the  Main Entrance and check in at the main information desk  You need to re-schedule your appointment should you arrive 10 or more minutes late.  We strive to give you quality time with our providers, and arriving late affects you and other patients whose appointments are after yours.  Also, if you no show three or more times for appointments you may be dismissed from the clinic at the providers discretion.     Again, thank you for choosing [facility].  Our hope is that these requests will decrease the amount of time that you wait before being seen by our physicians.       _____________________________________________________________  Should you have questions after your visit to [facility], please contact our office at [phone] between the hours of 8:30 a.m. and 4:30 p.m.  Voicemails left after 4:30 p.m. will not be returned until the following business day.  For prescription refill requests, have your pharmacy contact our office.         Resources For Cancer Patients and their Caregivers ? American Cancer Society: Can assist with transportation, wigs, general needs, runs Look Good Feel Better.        1-888-227-6333 ? Cancer Care: Provides financial assistance, online support groups,  medication/co-pay assistance.  1-800-813-HOPE (4673) ? [facility] Assists [place] cancer patients and their families through emotional , educational and financial support.  336-427-4357 ? [place] DSS Where to apply for food stamps, Medicaid and utility assistance. 336-342-1394 ? RCATS: Transportation to medical appointments. 336-347-2287 ? Social Security Administration: May apply for disability if have a Stage IV cancer. 336-342-7796 1-800-772-1213 ? [place] Aging, Disability and Transit Services: Assists with nutrition, care and transit needs. 336-349-2343  Cancer Center Support Programs: [email or web address]@ > Cancer Support Group  2nd Tuesday of the month 1pm-2pm, Journey Room  > Creative Journey  3rd Tuesday of the month 1130am-1pm, Journey Room  > Look Good Feel Better  1st Wednesday of the month 10am-12 noon, Journey Room (Call American Cancer [employer] to register 1-800-395-5775)    

## 2016-03-21 NOTE — Progress Notes (Signed)
Kendra Hanson Tolerated IV iron today Discharged ambulatory after 30 minute watch period

## 2016-03-22 ENCOUNTER — Encounter: Payer: Self-pay | Admitting: *Deleted

## 2016-03-22 ENCOUNTER — Ambulatory Visit: Payer: Self-pay | Admitting: *Deleted

## 2016-03-22 ENCOUNTER — Other Ambulatory Visit: Payer: Self-pay | Admitting: *Deleted

## 2016-03-22 NOTE — Patient Outreach (Signed)
[facility]) Care Management   03/22/2016  Kendra Hanson 21-May-1961 [id number]  Kendra Hanson is an 55 y.o. female who presents to the [place] Management office for routine Link To Wellness follow up for self management assistance with Type II DM, HTN, hyperlipidemia and morbid obesity.  Subjective:  Kendra Hanson says she has been receiving iron infusions since February for treatment of iron deficiency anemia.  She says she also saw her podiatrist for an infected blister on her right 4th toe that required I+D and a course of antibiotics.  She said she was going to a water zumba class at the [facility] and really enjoyed it but then she developed the blister on her toe and her podiatrist did not want her going in  the pool until the blister healed. She reports she checks her blood sugar in the mooning and is still having some occasional morning hypoglycemia when she takes her rapid acting insulin at night and doesn't eat.  Objective:   Review of Systems  Constitutional: Negative.     Physical Exam  Constitutional: She is oriented to person, place, and time. She appears well-developed and well-nourished.  Respiratory: Effort normal.  Neurological: She is alert and oriented to person, place, and time.  Skin: Skin is warm and dry.  Psychiatric: She has a normal mood and affect. Her behavior is normal. Judgment and thought content normal.    Encounter Medications:   Outpatient Encounter Prescriptions as of 03/22/2016  Medication Sig Note  . estradiol (VIVELLE-DOT) 0.0375 MG/24HR Place 1 patch onto the skin 3 (three) times a week. Monday, Wednesday, and Saturday.   Marland Kitchen exenatide (BYETTA) 10 MCG/0.04ML SOPN injection Inject 0.04 mLs (10 mcg total) into the skin 2 (two) times daily with a meal.   . fluticasone (FLONASE) 50 MCG/ACT nasal spray Place 2 sprays into both nostrils daily. (Patient taking differently: Place 2 sprays into both nostrils  daily as needed for allergies. ) 03/22/2016: Uses every day  . furosemide (LASIX) 20 MG tablet Take 1 tablet (20 mg total) by mouth 3 (three) times daily. (Patient taking differently: Take 20 mg by mouth 2 (two) times daily. )   . gabapentin (NEURONTIN) 100 MG capsule Take 200 mg by mouth daily. 03/22/2016: Taking 100 mg   . Lancet Device MISC Use with verio IQ meter three times daily to test blood sugar    . LANTUS SOLOSTAR 100 UNIT/ML Solostar Pen INJECT 60 UNITS UNDER THE SKIN DAILY   . loratadine (CLARITIN) 10 MG tablet Take 10 mg by mouth daily as needed for allergies.  03/22/2016: Takes everyday  . losartan (COZAAR) 100 MG tablet TAKE 1 TABLET BY MOUTH DAILY.   . metFORMIN (GLUCOPHAGE) 1000 MG tablet TAKE 1 TABLET BY MOUTH 2 TIMES DAILY WITH A MEAL   . naproxen sodium (ANAPROX) 220 MG tablet Take 220 mg by mouth daily as needed (pain).   . NOVOLOG FLEXPEN 100 UNIT/ML FlexPen INJECT 12 UNITS IN THE MORNING, 10 UNITS AT LUNCHTIME , AND 12 UNITS AT BEDTIME AS DIRECTED   . ondansetron (ZOFRAN) 4 MG tablet Take 1 tablet (4 mg total) by mouth 2 (two) times daily as needed for nausea or vomiting.   . pantoprazole (PROTONIX) 20 MG tablet TAKE 1 TABLET BY MOUTH EVERY DAY   . potassium chloride (K-DUR,KLOR-CON) 10 MEQ tablet Take 1 tablet (10 mEq total) by mouth 3 (three) times daily. (Patient taking differently: Take 10 mEq by mouth 2 (two)  times daily. )   . rosuvastatin (CRESTOR) 10 MG tablet TAKE 1 TABLET BY MOUTH ONCE DAILY AT BEDTIME   . TAZTIA XT 360 MG 24 hr capsule TAKE 1 CAPSULE BY MOUTH DAILY.   Marland Kitchen UNIFINE PENTIPS 31G X 8 MM MISC USE AS DIRECTED   . vitamin B-12 (CYANOCOBALAMIN) 100 MCG tablet Take 100 mcg by mouth daily.   . Vitamin D, Ergocalciferol, (DRISDOL) 50000 units CAPS capsule TAKE 1 CAPSULE (50,000 UNITS TOTAL) BY MOUTH ONCE A WEEK.   Marland Kitchen azithromycin (ZITHROMAX) 250 MG tablet Two tablets on day one, then one tablet daily for an additional 4 days (Patient not taking: Reported on  03/22/2016)   . benzonatate (TESSALON) 100 MG capsule Take 1 capsule (100 mg total) by mouth 2 (two) times daily as needed for cough. (Patient not taking: Reported on 03/22/2016)   . doxycycline (VIBRA-TABS) 100 MG tablet Take 1 tablet (100 mg total) by mouth 2 (two) times daily. (Patient not taking: Reported on 03/22/2016)   . fluconazole (DIFLUCAN) 150 MG tablet TAKE 1 TABLET BY MOUTH ONCE AS DIRECTED (Patient not taking: Reported on 03/22/2016)    No facility-administered encounter medications on file as of 03/22/2016.    Functional Status:   In your present state of health, do you have any difficulty performing the following activities: 12/08/2015 04/26/2015  Hearing? N N  Vision? N N  Difficulty concentrating or making decisions? N N  Walking or climbing stairs? N N  Dressing or bathing? N N  Doing errands, shopping? N N    Fall/Depression Screening:    PHQ 2/9 Scores 03/22/2016 08/27/2015 04/26/2015 06/09/2014  PHQ - 2 Score 0 0 1 2  PHQ- 9 Score - - - 9    Assessment:   [employer] employee and Link To Wellness member with Type II DM, HTN, hyperlipidemia and morbid obesity. Meeting treatment targets for lipids and BP but with most recent Hgb A1C 7.7% and highly variable fasting blood sugar readings.   Plan:  [facility] CM Care Plan Problem One        Most Recent Value   Care Plan Problem One  Type II DM, HTN, hyperlipidemia and morbid obesity (BMI= 53.87)  - not meeting Hgb A1C target as evidenced by Hgb A1C= 7.7% on 03/10/16 previous Hgb A1C= 7.6 on 11/29/15, meeting treatment targets for lipids and HTN as evidenced by normal lipid profile on 03/10/16 and BP readings <140/<90   Role Documenting the Problem One  Care Management [place] for Problem One  Active   [facility] Long Term Goal (31-90 days)  Improvement of  glycemic control as evidenced by improved Hgb A1C <7.5% at next assessment with ou episodes of hypoglycemia, ongoing good control of HTN and hyperlipidemia as evidenced by  normal lipid panel and BP readings <140/<90 consistently and evidence of weight loss or no weight gain at next Link To Wellness visit   [place] Term Goal Start Date  03/22/16   Interventions for Problem One Long Term Goal  discussed changes to health history including treatment of iron deficiency anemia and right toe blister, reviewed basic metabolic deficits in Type II DM, reviewed DM medications mechanism of action, again cautioned Kendra Hanson not to take Novolog insulin unless she eats within 15 minutes of dosing and not to take novolog prior to going to bed, reminded Kendra Hanson to take Byetta twice daily as directed as she is only taking it daily, reviewed  goals set with RD on 08/27/15 for  weight loss, reviewed likely reasons for her weight gain (side effect of insulin) despite reduced portions and feeding low blood sugars so emphasized importance of eliminating lows, reminded Kendra Hanson to  check CBG in pairs at supper and record food intake at supper, assisted Kendra Hanson with completing her self knowledge badge and earning points in the [place] by viewing the seasonal allergy video in the Points Vault on the [employer] web site, reminded her to get yearly eye check as she is over due, ensured all other health maintenance checks are up to date, will arrange  for Link To Wellness follow up in August     RNCM to fax today's office visit note to Dr. Moshe Cipro. RNCM will meet quarterly and as needed with patient per Link To Wellness program guidelines to assist with Type II DM, HTN, hyperlipidemia and obesity self-management and assess patient's progress toward mutually set goals. Barrington Ellison RN,CCM,CDE [place] Management Coordinator Link To Wellness Office Phone [phone] Office Fax [phone]

## 2016-03-27 ENCOUNTER — Encounter: Payer: Self-pay | Admitting: Family Medicine

## 2016-03-27 ENCOUNTER — Ambulatory Visit (INDEPENDENT_AMBULATORY_CARE_PROVIDER_SITE_OTHER): Payer: 59 | Admitting: Family Medicine

## 2016-03-27 VITALS — BP 122/64 | HR 95 | Resp 18 | Ht 65.0 in | Wt 311.0 lb

## 2016-03-27 DIAGNOSIS — E785 Hyperlipidemia, unspecified: Secondary | ICD-10-CM

## 2016-03-27 DIAGNOSIS — Z23 Encounter for immunization: Secondary | ICD-10-CM

## 2016-03-27 DIAGNOSIS — N760 Acute vaginitis: Secondary | ICD-10-CM

## 2016-03-27 DIAGNOSIS — I10 Essential (primary) hypertension: Secondary | ICD-10-CM

## 2016-03-27 DIAGNOSIS — E559 Vitamin D deficiency, unspecified: Secondary | ICD-10-CM

## 2016-03-27 DIAGNOSIS — E1122 Type 2 diabetes mellitus with diabetic chronic kidney disease: Secondary | ICD-10-CM

## 2016-03-27 NOTE — Progress Notes (Signed)
Subjective:    [name], female    DOB: [birth year], 55 y.o.   MRN: [id number]  HPI    Kendra Hanson     MRN: [id number]      DOB: Oct 03, 1961   HPI Kendra Hanson is here for follow up and re-evaluation of chronic medical conditions, medication management and review of any available recent lab and radiology data.  Preventive health is updated, specifically  Cancer screening and Immunization.   Questions or concerns regarding consultations or procedures which the PT has had in the interim are  addressed. The PT denies any adverse reactions to current medications since the last visit.  There are no new concerns.  There are no specific complaints   ROS Denies recent fever or chills. Denies sinus pressure, nasal congestion, ear pain or sore throat. Denies chest congestion, productive cough or wheezing. Denies chest pains, palpitations and leg swelling Denies abdominal pain, nausea, vomiting,diarrhea or constipation.   Denies dysuria, frequency, hesitancy or incontinence. Denies joint pain, swelling and limitation in mobility. Denies headaches, seizures, numbness, or tingling. Denies depression, anxiety or insomnia. Denies skin break down or rash.   PE  BP 122/64 mmHg  Pulse 95  Resp 18  Ht 5\' 5"  (1.651 m)  Wt 311 lb (141.069 kg)  BMI 51.75 kg/m2  SpO2 96%  Patient alert and oriented and in no cardiopulmonary distress.  HEENT: No facial asymmetry, EOMI,   oropharynx pink and moist.  Neck supple no JVD, no mass.  Chest: Clear to auscultation bilaterally.  CVS: S1, S2 no murmurs, no S3.Regular rate.  ABD: Soft non tender.   Ext: No edema  MS: Adequate ROM spine, shoulders, hips and knees.  Skin: Intact, no ulcerations or rash noted.  Psych: Good eye contact, normal affect. Memory intact not anxious or depressed appearing.  CNS: CN 2-12 intact, power,  normal throughout.no focal deficits noted.   Assessment & Plan   *Essential  hypertension Controlled, no change in medication DASH diet and commitment to daily physical activity for a minimum of 30 minutes discussed and encouraged, as a part of hypertension management. The importance of attaining a healthy weight is also discussed.  BP/Weight 03/27/2016 03/22/2016 03/21/2016 03/07/2016 02/29/2016 02/03/2016 [id number]  Systolic BP 123XX123 A999333 Q000111Q [id number] Q000111Q A999333  Diastolic BP 64 80 69 53 52 56 59  Wt. (Lbs) 311 314 - - - - -  BMI 51.75 53.87 - - - - -        Type II diabetes mellitus with renal manifestations Unchanged Kendra Hanson is reminded of the importance of commitment to daily physical activity for 30 minutes or more, as able and the need to limit carbohydrate intake to 30 to 60 grams per meal to help with blood sugar control.   The need to take medication as prescribed, test blood sugar as directed, and to call between visits if there is a concern that blood sugar is uncontrolled is also discussed.   Kendra Hanson is reminded of the importance of daily foot exam, annual eye examination, and good blood sugar, blood pressure and cholesterol control. Unchanged, no med change  Diabetic Labs Latest Ref Rng 03/10/2016 11/29/2015 08/06/2015 04/22/2015 01/27/2015  HbA1c 4.8 - 5.6 % 7.7(H) 7.6(H) 7.4(H) 8.0(H) 8.5(H)  Microalbumin Not Estab. ug/mL 110.4(H) - - - 24.5(H)  Micro/Creat Ratio 0.0 - 30.0 mg/g creat 82.1(H) - - - 160.3(H)  Chol 0 - 200 mg/dL 140 - 146 - 140  HDL >40 mg/dL  46 - 48 - 48  Calc LDL 0 - 99 mg/dL 85 - 84 - 76  Triglycerides <150 mg/dL 47 - 72 - 78  Creatinine 0.44 - 1.00 mg/dL 1.04(H) 1.03 1.03 1.09 0.85   BP/Weight 03/27/2016 03/22/2016 03/21/2016 03/07/2016 02/29/2016 02/03/2016 [id number]  Systolic BP 123XX123 A999333 Q000111Q XX123456 XX123456 Q000111Q A999333  Diastolic BP 64 80 69 53 52 56 59  Wt. (Lbs) 311 314 - - - - -  BMI 51.75 53.87 - - - - -   Foot/eye exam completion dates 07/12/2015 06/09/2014  Foot Form Completion Done Done         Morbid  obesity Deteriorated. Patient re-educated about  the importance of commitment to a  minimum of 150 minutes of exercise per week.  The importance of healthy food choices with portion control discussed. Encouraged to start a food diary, count calories and to consider  joining a support group. Sample diet sheets offered. Goals set by the patient for the next several months.   Weight /BMI 03/27/2016 03/22/2016 01/14/2016  WEIGHT 311 lb 314 lb 309 lb  HEIGHT 5\' 5"  5\' 4"  -  BMI 51.75 kg/m2 53.87 kg/m2 51.42 kg/m2    Current exercise per week 90 minutes.   Hyperlipidemia LDL goal <100 Hyperlipidemia:Low fat diet discussed and encouraged.   Lipid Panel  Lab Results  Component Value Date   CHOL 140 03/10/2016   HDL 46 03/10/2016   LDLCALC 85 03/10/2016   TRIG 47 03/10/2016   CHOLHDL 3.0 03/10/2016   Controlled, no change in medication      Vaginitis and vulvovaginitis Pt symptomatic , reports smelly discharge , self collected specimen submitted for treatment      Review of Systems     Objective:   Physical Exam        Assessment & Plan:

## 2016-03-27 NOTE — Patient Instructions (Addendum)
F/u in 3.5 month, call if you need me sooner  Non fast cmp and eGFr, HBA1C, TSh , vit D in 3 month  Pneumonia 23 today  Specimen for testing from office today  Please work on good  health habits so that your health will improve. 1. Commitment to daily physical activity for 30 to 60  minutes, if you are able to do this.  2. Commitment to wise food choices. Aim for half of your  food intake to be vegetable and fruit, one quarter starchy foods, and one quarter protein. Try to eat on a regular schedule  3 meals per day, snacking between meals should be limited to vegetables or fruits or small portions of nuts. 64 ounces of water per day is generally recommended, unless you have specific health conditions, like heart failure or kidney failure where you will need to limit fluid intake.  3. Commitment to sufficient and a  good quality of physical and mental rest daily, generally between 6 to 8 hours per day.  WITH PERSISTANCE AND PERSEVERANCE, THE IMPOSSIBLE , BECOMES THE NORM! Thanks for choosing [facility], we consider it a privelige to serve you.

## 2016-03-28 ENCOUNTER — Other Ambulatory Visit (HOSPITAL_COMMUNITY)
Admission: RE | Admit: 2016-03-28 | Discharge: 2016-03-28 | Disposition: A | Discharge status: Home / Self Care | Payer: 59 | Source: Ambulatory Visit | Attending: Family Medicine | Admitting: Family Medicine

## 2016-03-28 DIAGNOSIS — Z23 Encounter for immunization: Secondary | ICD-10-CM | POA: Diagnosis not present

## 2016-03-28 DIAGNOSIS — N760 Acute vaginitis: Secondary | ICD-10-CM | POA: Insufficient documentation

## 2016-03-28 DIAGNOSIS — E1122 Type 2 diabetes mellitus with diabetic chronic kidney disease: Secondary | ICD-10-CM | POA: Diagnosis not present

## 2016-03-28 DIAGNOSIS — E559 Vitamin D deficiency, unspecified: Secondary | ICD-10-CM | POA: Diagnosis not present

## 2016-03-28 DIAGNOSIS — I10 Essential (primary) hypertension: Secondary | ICD-10-CM | POA: Diagnosis not present

## 2016-03-30 ENCOUNTER — Other Ambulatory Visit: Payer: Self-pay | Admitting: Family Medicine

## 2016-03-30 LAB — CERVICOVAGINAL ANCILLARY ONLY: Wet Prep (BD Affirm): POSITIVE — AB

## 2016-03-30 MED ORDER — FLUCONAZOLE 150 MG PO TABS: ORAL_TABLET | ORAL | Status: DC

## 2016-03-30 MED FILL — TRUE METRIX GLUCOSE TEST ST: 90 days supply | Qty: 300 | Fill #0

## 2016-03-31 ENCOUNTER — Other Ambulatory Visit: Payer: Self-pay

## 2016-03-31 MED ORDER — FLUCONAZOLE 150 MG PO TABS: ORAL_TABLET | ORAL | Status: DC

## 2016-03-31 MED FILL — PANTOPRAZOLE SOD DR 20 MG T: 20 | 90 days supply | Qty: 90 | Fill #1

## 2016-03-31 MED FILL — FLUCONAZOLE 150 MG TABLET: 150 | 1 days supply | Qty: 1 | Fill #0

## 2016-03-31 MED FILL — DILTIAZEM HCL ER 360 MG CAP: 360 | 90 days supply | Qty: 90 | Fill #1

## 2016-03-31 MED FILL — KLOR-CON M10 TABLET: 10 | 90 days supply | Qty: 270 | Fill #1

## 2016-04-02 DIAGNOSIS — N760 Acute vaginitis: Secondary | ICD-10-CM | POA: Insufficient documentation

## 2016-04-02 DIAGNOSIS — N952 Postmenopausal atrophic vaginitis: Secondary | ICD-10-CM | POA: Insufficient documentation

## 2016-04-02 NOTE — Assessment & Plan Note (Signed)
Hyperlipidemia:Low fat diet discussed and encouraged.   Lipid Panel  Lab Results  Component Value Date   CHOL 140 03/10/2016   HDL 46 03/10/2016   LDLCALC 85 03/10/2016   TRIG 47 03/10/2016   CHOLHDL 3.0 03/10/2016   Controlled, no change in medication

## 2016-04-02 NOTE — Assessment & Plan Note (Signed)
Deteriorated. Patient re-educated about  the importance of commitment to a  minimum of 150 minutes of exercise per week.  The importance of healthy food choices with portion control discussed. Encouraged to start a food diary, count calories and to consider  joining a support group. Sample diet sheets offered. Goals set by the patient for the next several months.   Weight /BMI 03/27/2016 03/22/2016 01/14/2016  WEIGHT 311 lb 314 lb 309 lb  HEIGHT 5\' 5"  5\' 4"  -  BMI 51.75 kg/m2 53.87 kg/m2 51.42 kg/m2    Current exercise per week 90 minutes.

## 2016-04-02 NOTE — Assessment & Plan Note (Signed)
Controlled, no change in medication DASH diet and commitment to daily physical activity for a minimum of 30 minutes discussed and encouraged, as a part of hypertension management. The importance of attaining a healthy weight is also discussed.  BP/Weight 03/27/2016 03/22/2016 03/21/2016 03/07/2016 02/29/2016 02/03/2016 [id number]  Systolic BP 123XX123 A999333 Q000111Q XX123456 [id number] Q000111Q A999333  Diastolic BP 64 80 69 53 52 56 59  Wt. (Lbs) 311 314 - - - - -  BMI 51.75 53.87 - - - - -

## 2016-04-02 NOTE — Assessment & Plan Note (Signed)
Pt symptomatic , reports smelly discharge , self collected specimen submitted for treatment

## 2016-04-02 NOTE — Assessment & Plan Note (Signed)
Unchanged Kendra Hanson is reminded of the importance of commitment to daily physical activity for 30 minutes or more, as able and the need to limit carbohydrate intake to 30 to 60 grams per meal to help with blood sugar control.   The need to take medication as prescribed, test blood sugar as directed, and to call between visits if there is a concern that blood sugar is uncontrolled is also discussed.   Kendra Hanson is reminded of the importance of daily foot exam, annual eye examination, and good blood sugar, blood pressure and cholesterol control. Unchanged, no med change  Diabetic Labs Latest Ref Rng 03/10/2016 11/29/2015 08/06/2015 04/22/2015 01/27/2015  HbA1c 4.8 - 5.6 % 7.7(H) 7.6(H) 7.4(H) 8.0(H) 8.5(H)  Microalbumin Not Estab. ug/mL 110.4(H) - - - 24.5(H)  Micro/Creat Ratio 0.0 - 30.0 mg/g creat 82.1(H) - - - 160.3(H)  Chol 0 - 200 mg/dL 140 - 146 - 140  HDL >40 mg/dL 46 - 48 - 48  Calc LDL 0 - 99 mg/dL 85 - 84 - 76  Triglycerides <150 mg/dL 47 - 72 - 78  Creatinine 0.44 - 1.00 mg/dL 1.04(H) 1.03 1.03 1.09 0.85   BP/Weight 03/27/2016 03/22/2016 03/21/2016 03/07/2016 02/29/2016 02/03/2016 [id number]  Systolic BP 123XX123 A999333 Q000111Q XX123456 XX123456 Q000111Q A999333  Diastolic BP 64 80 69 53 52 56 59  Wt. (Lbs) 311 314 - - - - -  BMI 51.75 53.87 - - - - -   Foot/eye exam completion dates 07/12/2015 06/09/2014  Foot Form Completion Done Done

## 2016-04-04 DIAGNOSIS — G4733 Obstructive sleep apnea (adult) (pediatric): Secondary | ICD-10-CM | POA: Diagnosis not present

## 2016-04-11 MED FILL — VIT D2 1.25 MG (50,000 UNIT: 1.25 MG | 84 days supply | Qty: 12 | Fill #1

## 2016-04-13 ENCOUNTER — Other Ambulatory Visit (HOSPITAL_COMMUNITY): Payer: Self-pay | Admitting: Oncology

## 2016-04-13 ENCOUNTER — Telehealth (HOSPITAL_COMMUNITY): Payer: Self-pay | Admitting: *Deleted

## 2016-04-13 DIAGNOSIS — E611 Iron deficiency: Secondary | ICD-10-CM

## 2016-04-13 DIAGNOSIS — E538 Deficiency of other specified B group vitamins: Secondary | ICD-10-CM

## 2016-04-14 ENCOUNTER — Other Ambulatory Visit (HOSPITAL_COMMUNITY): Payer: 59

## 2016-04-14 ENCOUNTER — Encounter (HOSPITAL_COMMUNITY): Payer: 59 | Attending: Hematology & Oncology

## 2016-04-14 DIAGNOSIS — Z833 Family history of diabetes mellitus: Secondary | ICD-10-CM | POA: Insufficient documentation

## 2016-04-14 DIAGNOSIS — Z7984 Long term (current) use of oral hypoglycemic drugs: Secondary | ICD-10-CM | POA: Insufficient documentation

## 2016-04-14 DIAGNOSIS — K219 Gastro-esophageal reflux disease without esophagitis: Secondary | ICD-10-CM | POA: Diagnosis not present

## 2016-04-14 DIAGNOSIS — D509 Iron deficiency anemia, unspecified: Secondary | ICD-10-CM | POA: Diagnosis not present

## 2016-04-14 DIAGNOSIS — E119 Type 2 diabetes mellitus without complications: Secondary | ICD-10-CM | POA: Diagnosis not present

## 2016-04-14 DIAGNOSIS — I10 Essential (primary) hypertension: Secondary | ICD-10-CM | POA: Diagnosis not present

## 2016-04-14 DIAGNOSIS — E611 Iron deficiency: Secondary | ICD-10-CM

## 2016-04-14 DIAGNOSIS — D649 Anemia, unspecified: Secondary | ICD-10-CM | POA: Diagnosis not present

## 2016-04-14 DIAGNOSIS — Z9889 Other specified postprocedural states: Secondary | ICD-10-CM | POA: Insufficient documentation

## 2016-04-14 DIAGNOSIS — E785 Hyperlipidemia, unspecified: Secondary | ICD-10-CM | POA: Insufficient documentation

## 2016-04-14 DIAGNOSIS — E538 Deficiency of other specified B group vitamins: Secondary | ICD-10-CM

## 2016-04-14 LAB — CBC
HCT: 36.9 % (ref 36.0–46.0)
Hemoglobin: 11.9 g/dL — ABNORMAL LOW (ref 12.0–15.0)
MCH: 29 pg (ref 26.0–34.0)
MCHC: 32.2 g/dL (ref 30.0–36.0)
MCV: 89.8 fL (ref 78.0–100.0)
Platelets: 274 10*3/uL (ref 150–400)
RBC: 4.11 MIL/uL (ref 3.87–5.11)
RDW: 14.1 % (ref 11.5–15.5)
WBC: 11.4 10*3/uL — ABNORMAL HIGH (ref 4.0–10.5)

## 2016-04-14 LAB — FERRITIN: Ferritin: 198 ng/mL (ref 11–307)

## 2016-04-14 LAB — VITAMIN B12: Vitamin B-12: 930 pg/mL — ABNORMAL HIGH (ref 180–914)

## 2016-04-17 ENCOUNTER — Telehealth (HOSPITAL_COMMUNITY): Payer: Self-pay | Admitting: *Deleted

## 2016-04-17 NOTE — Telephone Encounter (Signed)
Defer to Primary care MD.  TK

## 2016-04-27 DIAGNOSIS — H2513 Age-related nuclear cataract, bilateral: Secondary | ICD-10-CM | POA: Diagnosis not present

## 2016-04-27 DIAGNOSIS — H524 Presbyopia: Secondary | ICD-10-CM | POA: Diagnosis not present

## 2016-04-27 DIAGNOSIS — H52221 Regular astigmatism, right eye: Secondary | ICD-10-CM | POA: Diagnosis not present

## 2016-04-27 DIAGNOSIS — E1165 Type 2 diabetes mellitus with hyperglycemia: Secondary | ICD-10-CM | POA: Diagnosis not present

## 2016-05-04 ENCOUNTER — Encounter (HOSPITAL_COMMUNITY): Payer: 59

## 2016-05-04 ENCOUNTER — Encounter: Payer: Self-pay | Admitting: Family Medicine

## 2016-05-04 DIAGNOSIS — K219 Gastro-esophageal reflux disease without esophagitis: Secondary | ICD-10-CM | POA: Diagnosis not present

## 2016-05-04 DIAGNOSIS — E119 Type 2 diabetes mellitus without complications: Secondary | ICD-10-CM | POA: Diagnosis not present

## 2016-05-04 DIAGNOSIS — D649 Anemia, unspecified: Secondary | ICD-10-CM | POA: Diagnosis not present

## 2016-05-04 DIAGNOSIS — Z9889 Other specified postprocedural states: Secondary | ICD-10-CM | POA: Diagnosis not present

## 2016-05-04 DIAGNOSIS — E611 Iron deficiency: Secondary | ICD-10-CM

## 2016-05-04 DIAGNOSIS — I10 Essential (primary) hypertension: Secondary | ICD-10-CM | POA: Diagnosis not present

## 2016-05-04 DIAGNOSIS — E785 Hyperlipidemia, unspecified: Secondary | ICD-10-CM | POA: Diagnosis not present

## 2016-05-04 DIAGNOSIS — D509 Iron deficiency anemia, unspecified: Secondary | ICD-10-CM | POA: Diagnosis not present

## 2016-05-04 DIAGNOSIS — Z7984 Long term (current) use of oral hypoglycemic drugs: Secondary | ICD-10-CM | POA: Diagnosis not present

## 2016-05-04 LAB — CBC WITH DIFFERENTIAL/PLATELET
Basophils Absolute: 0.1 10*3/uL (ref 0.0–0.1)
Basophils Relative: 1 %
Eosinophils Absolute: 0.2 10*3/uL (ref 0.0–0.7)
Eosinophils Relative: 3 %
HCT: 35 % — ABNORMAL LOW (ref 36.0–46.0)
Hemoglobin: 11.6 g/dL — ABNORMAL LOW (ref 12.0–15.0)
Lymphocytes Relative: 30 %
Lymphs Abs: 2.4 10*3/uL (ref 0.7–4.0)
MCH: 30.2 pg (ref 26.0–34.0)
MCHC: 33.1 g/dL (ref 30.0–36.0)
MCV: 91.1 fL (ref 78.0–100.0)
Monocytes Absolute: 0.4 10*3/uL (ref 0.1–1.0)
Monocytes Relative: 5 %
Neutro Abs: 5 10*3/uL (ref 1.7–7.7)
Neutrophils Relative %: 61 %
Platelets: 277 10*3/uL (ref 150–400)
RBC: 3.84 MIL/uL — ABNORMAL LOW (ref 3.87–5.11)
RDW: 14.1 % (ref 11.5–15.5)
WBC: 8.1 10*3/uL (ref 4.0–10.5)

## 2016-05-04 LAB — C-REACTIVE PROTEIN: CRP: 1.1 mg/dL — ABNORMAL HIGH (ref <1.0)

## 2016-05-04 LAB — VITAMIN B12: Vitamin B-12: 1101 pg/mL — ABNORMAL HIGH (ref 180–914)

## 2016-05-04 LAB — FERRITIN: Ferritin: 171 ng/mL (ref 11–307)

## 2016-05-04 LAB — SEDIMENTATION RATE: Sed Rate: 25 mm/hr — ABNORMAL HIGH (ref 0–22)

## 2016-05-05 ENCOUNTER — Other Ambulatory Visit (HOSPITAL_COMMUNITY): Payer: 59

## 2016-05-17 ENCOUNTER — Other Ambulatory Visit: Payer: Self-pay | Admitting: Family Medicine

## 2016-05-17 MED FILL — ROSUVASTATIN CALCIUM 10 MG: 10 | 90 days supply | Qty: 90 | Fill #1

## 2016-05-17 MED FILL — LANTUS SOLOSTAR 100 UNITS/M: 100 | 90 days supply | Qty: 54 | Fill #2

## 2016-05-17 MED FILL — metFORMIN HCL 1000 MG TABS: 1000 | 90 days supply | Qty: 180 | Fill #0

## 2016-05-17 MED FILL — LOSARTAN POTASSIUM 100 MG T: 100 | 90 days supply | Qty: 90 | Fill #1

## 2016-05-18 ENCOUNTER — Ambulatory Visit (HOSPITAL_COMMUNITY): Payer: 59 | Admitting: Hematology & Oncology

## 2016-05-18 MED FILL — ESTRADIOL PATCH 0.0375: 0.0375 | 27 days supply | Qty: 8 | Fill #8

## 2016-05-25 ENCOUNTER — Ambulatory Visit (HOSPITAL_COMMUNITY): Payer: 59 | Admitting: Hematology & Oncology

## 2016-05-31 ENCOUNTER — Encounter (HOSPITAL_COMMUNITY): Payer: 59 | Attending: Hematology & Oncology | Admitting: Hematology & Oncology

## 2016-05-31 VITALS — BP 146/51 | HR 83 | Temp 98.7°F | Resp 18 | Wt 315.6 lb

## 2016-05-31 DIAGNOSIS — E538 Deficiency of other specified B group vitamins: Secondary | ICD-10-CM | POA: Diagnosis not present

## 2016-05-31 DIAGNOSIS — D509 Iron deficiency anemia, unspecified: Secondary | ICD-10-CM | POA: Insufficient documentation

## 2016-05-31 DIAGNOSIS — E119 Type 2 diabetes mellitus without complications: Secondary | ICD-10-CM | POA: Insufficient documentation

## 2016-05-31 DIAGNOSIS — D649 Anemia, unspecified: Secondary | ICD-10-CM | POA: Diagnosis not present

## 2016-05-31 DIAGNOSIS — Z833 Family history of diabetes mellitus: Secondary | ICD-10-CM | POA: Insufficient documentation

## 2016-05-31 DIAGNOSIS — Z9889 Other specified postprocedural states: Secondary | ICD-10-CM | POA: Insufficient documentation

## 2016-05-31 DIAGNOSIS — E611 Iron deficiency: Secondary | ICD-10-CM

## 2016-05-31 DIAGNOSIS — E785 Hyperlipidemia, unspecified: Secondary | ICD-10-CM | POA: Insufficient documentation

## 2016-05-31 DIAGNOSIS — Z7984 Long term (current) use of oral hypoglycemic drugs: Secondary | ICD-10-CM | POA: Insufficient documentation

## 2016-05-31 DIAGNOSIS — I10 Essential (primary) hypertension: Secondary | ICD-10-CM | POA: Insufficient documentation

## 2016-05-31 DIAGNOSIS — K219 Gastro-esophageal reflux disease without esophagitis: Secondary | ICD-10-CM | POA: Insufficient documentation

## 2016-05-31 MED FILL — NOVOLOG FLEXPEN SYRINGE: 100 | 46 days supply | Qty: 15 | Fill #2

## 2016-05-31 NOTE — Progress Notes (Signed)
[facility] Hematology/Oncology Progress Note  Name: Kendra Hanson      MRN: [id number]     Date: 05/31/2016 Time:2:14 PM   REFERRING PHYSICIAN:  Tula Nakayama, MD   DIAGNOSIS: Low B12 Iron deficiency  Normocytic, normochromic anemia, S/P colonoscopy by Dr. Laural Golden on 04/05/2015  HISTORY OF PRESENT ILLNESS:   Kendra Hanson is a 55 yo black American female with a past medical history significant for DM type II, sleep apnea, morbid obesity, hyperlipidemia, GERD, HTN, and history of leiomyoma of uterus who is referred to [facility] for iron deficiency anemia by her primary care provider, Dr. Moshe Cipro.  Kendra Hanson is unaccompanied. I personally reviewed and went over laboratory studies with the patient.  She is feeling fine. She admits some days she feels more fatigued than others, but she is not concerned.   She does report that she keeps gaining weight. She has gained 4 lbs since 03/27/2016.  She reports her nausea in the mornings continues. She admits this nausea may be related to the medications she takes at night. She also notes her blood sugar drops early in the morning.  She is taking B12 and Vitamin D supplements.  She underwent an upper endoscopy on 01/21/2016. She follows with her PCP, Dr. Moshe Cipro regularly. She has done well with IV iron replacement. She had 4 B12 injections and is currently on B12 orally.   PAST MEDICAL HISTORY:   Past Medical History  Diagnosis Date  . Insulin dependent diabetes mellitus ([place]) 1996  . Hypertension 2008    Normal CBC and CMet in 2012; negative stress nuclear in 2006- patient asymptomatic  . Obesity   . Hyperlipidemia 2008    Lipid profile in 04/2011:136, 53, 43  . Multiple allergies     perennial  . Fibroids     Uterine  . PONV (postoperative nausea and vomiting)   . Sleep apnea     CPAP  . GERD (gastroesophageal reflux disease)   . Anemia   . Normocytic normochromic anemia 12/27/2015    ALLERGIES: No Known  Allergies    MEDICATIONS: I have reviewed the patient's current medications.    Current Outpatient Prescriptions on File Prior to Visit  Medication Sig Dispense Refill  . estradiol (VIVELLE-DOT) 0.0375 MG/24HR Place 1 patch onto the skin 3 (three) times a week. Monday, Wednesday, and Saturday.  12  . exenatide (BYETTA) 10 MCG/0.04ML SOPN injection Inject 0.04 mLs (10 mcg total) into the skin 2 (two) times daily with a meal. 3 pen 3  . fluconazole (DIFLUCAN) 150 MG tablet One tablet one time only 1 tablet 0  . fluticasone (FLONASE) 50 MCG/ACT nasal spray Place 2 sprays into both nostrils daily. (Patient taking differently: Place 2 sprays into both nostrils daily as needed for allergies. ) 48 g 1  . furosemide (LASIX) 20 MG tablet Take 1 tablet (20 mg total) by mouth 3 (three) times daily. (Patient taking differently: Take 20 mg by mouth 2 (two) times daily. ) 270 tablet 1  . gabapentin (NEURONTIN) 100 MG capsule Take 200 mg by mouth daily.    Elmore Guise Device MISC Use with verio IQ meter three times daily to test blood sugar  100 each PRN  . LANTUS SOLOSTAR 100 UNIT/ML Solostar Pen INJECT 60 UNITS UNDER THE SKIN DAILY 60 mL 12  . loratadine (CLARITIN) 10 MG tablet Take 10 mg by mouth daily as needed for allergies.     Marland Kitchen losartan (COZAAR) 100  MG tablet TAKE 1 TABLET BY MOUTH DAILY. 90 tablet 1  . metFORMIN (GLUCOPHAGE) 1000 MG tablet TAKE 1 TABLET BY MOUTH 2 TIMES DAILY WITH A MEAL 180 tablet PRN  . naproxen sodium (ANAPROX) 220 MG tablet Take 220 mg by mouth daily as needed (pain).    . NOVOLOG FLEXPEN 100 UNIT/ML FlexPen INJECT 12 UNITS IN THE MORNING, 10 UNITS AT LUNCHTIME , AND 12 UNITS AT BEDTIME AS DIRECTED 15 mL PRN  . ondansetron (ZOFRAN) 4 MG tablet Take 1 tablet (4 mg total) by mouth 2 (two) times daily as needed for nausea or vomiting. 30 tablet 0  . pantoprazole (PROTONIX) 20 MG tablet TAKE 1 TABLET BY MOUTH EVERY DAY 90 tablet 1  . potassium chloride (K-DUR,KLOR-CON) 10 MEQ tablet Take  1 tablet (10 mEq total) by mouth 3 (three) times daily. (Patient taking differently: Take 10 mEq by mouth 2 (two) times daily. ) 270 tablet 1  . rosuvastatin (CRESTOR) 10 MG tablet TAKE 1 TABLET BY MOUTH ONCE DAILY AT BEDTIME 90 tablet 3  . TAZTIA XT 360 MG 24 hr capsule TAKE 1 CAPSULE BY MOUTH DAILY. 90 capsule 1  . UNIFINE PENTIPS 31G X 8 MM MISC USE AS DIRECTED 100 each 3  . vitamin B-12 (CYANOCOBALAMIN) 100 MCG tablet Take 100 mcg by mouth daily.    . Vitamin D, Ergocalciferol, (DRISDOL) 50000 units CAPS capsule TAKE 1 CAPSULE (50,000 UNITS TOTAL) BY MOUTH ONCE A WEEK. 12 capsule 3   No current facility-administered medications on file prior to visit.     PAST SURGICAL HISTORY Past Surgical History  Procedure Laterality Date  . Refractive surgery  2011    Bilateral, two seperate occasions first in 2006  . Retinal detachment surgery Bilateral 05/2005  . Dental surgery    . Colonoscopy N/A 04/05/2015    Procedure: COLONOSCOPY;  Surgeon: Rogene Houston, MD;  [facility];  Service: Endoscopy;  Laterality: N/A;  730  . Esophagogastroduodenoscopy N/A 01/21/2016    Procedure: ESOPHAGOGASTRODUODENOSCOPY (EGD);  Surgeon: Rogene Houston, MD;  [facility];  Service: Endoscopy;  Laterality: N/A;  11:15    FAMILY HISTORY: Family History  Problem Relation Age of Onset  . Hypertension Mother   . Diabetes Mother   . Kidney failure Mother   . Stroke Mother   . Kidney failure Father   . Diabetes Father   . Heart attack Father   . COPD Sister   . Diabetes Brother   . Diabetes Brother   . Diabetes Brother   . Diabetes Brother   . Diabetes Brother   . Hypertension Brother    Mother deceased 62 yo with a history of stroke Father 78 yo deceased in the OR during AV shunt procedure for hemodialysis There was a total of 8 siblings, all deceased. She has 2 boys, ages 16 and 56 yo.  56 yo is in [place] and he is in the [employer].  55 yo is in [place].  She has 2  grandchildren a boy and girl.    No cancers in the family.  SOCIAL HISTORY:  reports that she has never smoked. She has never used smokeless tobacco. She reports that she does not drink alcohol or use illicit drugs.  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    PERFORMANCE STATUS: The patient's performance status is 0 - Asymptomatic  Positive for malaise/fatigue Intermittent fatigue Positive for nausea Morning nausea Positive  for weight gain 14 point review of systems was performed and is negative except as detailed under history of present illness and above   PHYSICAL EXAM: Most Recent Vital Signs: Blood pressure 146/51, pulse 83, temperature 98.7 F (37.1 C), temperature source Oral, resp. rate 18, weight 315 lb 9.6 oz (143.155 kg), SpO2 96 %. General appearance: alert, cooperative, appears stated age, no distress, morbidly obese and unaccompanied Head: Normocephalic, without obvious abnormality, atraumatic Eyes: negative findings: lids and lashes normal, conjunctivae and sclerae normal, corneas clear and pupils equal, round, reactive to light and accomodation Nose: Nares normal. Septum midline. Mucosa normal. No drainage or sinus tenderness. Throat: normal findings: lips normal without lesions, buccal mucosa normal, tongue midline and normal and oropharynx pink & moist without lesions or evidence of thrush and abnormal findings: dentition: upper and lower dentures Neck: supple, symmetrical, trachea midline Lungs: clear to auscultation bilaterally and normal percussion bilaterally Heart: regular rate and rhythm, S1, S2 normal, no murmur, click, rub or gallop Abdomen: soft, non-tender; bowel sounds normal; no masses,  no organomegaly Extremities: extremities normal, atraumatic, no cyanosis or edema Skin: Skin color, texture, turgor normal. No rashes or lesions Lymph nodes: Cervical, supraclavicular, and axillary nodes normal. Neurologic: Alert and oriented X 3,  normal strength and tone. Normal symmetric reflexes. Normal coordination and gait  LABORATORY DATA:  I have reviewed the data as listed.  CBC    Component Value Date/Time   WBC 8.1 05/04/2016 1247   RBC 3.84* 05/04/2016 1247   RBC 4.10 12/27/2015 1235   HGB 11.6* 05/04/2016 1247   HCT 35.0* 05/04/2016 1247   PLT 277 05/04/2016 1247   MCV 91.1 05/04/2016 1247   MCH 30.2 05/04/2016 1247   MCHC 33.1 05/04/2016 1247   RDW 14.1 05/04/2016 1247   LYMPHSABS 2.4 05/04/2016 1247   MONOABS 0.4 05/04/2016 1247   EOSABS 0.2 05/04/2016 1247   BASOSABS 0.1 05/04/2016 1247      Chemistry      Component Value Date/Time   NA 137 03/10/2016 0850   K 4.3 03/10/2016 0850   CL 102 03/10/2016 0850   CO2 27 03/10/2016 0850   BUN 19 03/10/2016 0850   CREATININE 1.04* 03/10/2016 0850   CREATININE 1.03 11/29/2015 1624      Component Value Date/Time   CALCIUM 8.7* 03/10/2016 0850   ALKPHOS 82 03/10/2016 0850   AST 13* 03/10/2016 0850   ALT 13* 03/10/2016 0850   BILITOT 0.5 03/10/2016 0850     Lab Results  Component Value Date   IRON 46 12/27/2015   TIBC 351 12/27/2015   FERRITIN 171 05/04/2016   Lab Results  Component Value Date   HGBA1C 7.7* 03/10/2016   Results for YAEKO, FAZEKAS (MRN [id number])   Ref. Range 05/04/2016 12:47  Ferritin Latest Ref Range: 11 - 307 ng/mL 171  CRP Latest Ref Range: <1.0 mg/dL 1.1 (H)  Vitamin B12 Latest Ref Range: 180 - 914 pg/mL 1,101 (H)    RADIOGRAPHY: I have personally reviewed the radiological images as listed and agreed with the findings in the report.  CLINICAL DATA: Screening.  EXAM: DIGITAL SCREENING BILATERAL MAMMOGRAM WITH CAD  COMPARISON: Previous exam(s).  ACR Breast Density Category b: There are scattered areas of fibroglandular density.  FINDINGS: There are no findings suspicious for malignancy. Images were processed with CAD.  IMPRESSION: No mammographic evidence of malignancy. A result letter of  this screening mammogram will be mailed directly to the patient.  RECOMMENDATION: Screening mammogram in one year. (  Code:[id number]-B-01Y)  BI-RADS CATEGORY 1: Negative.   Electronically Signed  By: Lillia Mountain M.D.  On: 11/22/2015 13:10   PATHOLOGY:  NONE  ASSESSMENT/PLAN:  Normocytic normochromic anemia Iron deficiency Intolerance to oral iron Low B12 Colonoscopy 04/05/2015 WNL Dr. Laural Golden EGD 01/21/2016 with GERD/gastric polyps  Minimal normocytic, normochromic anemia dating back to 09/2014. She has received IV iron with excellent tolerance.   Intolerant to ferrous sulfate with abdominal pain/bloating/constipation.  She continues on B12, B12 levels are excellent   I personally reviewed and went over laboratory studies with the patient. I spoke with the patient about further investigation of her anemia would include a bone marrow biopsy. Since anemia is not severe we have opted for observation for now. CBC, ferritin in 3 months. Follow-up with labs in 6 months.   She will be scheduled for IV iron as needed.  Orders Placed This Encounter  Procedures  . CBC with Differential    Standing Status:   Standing    Number of Occurrences:   2    Standing Expiration Date:   08/31/2017  . Ferritin    Standing Status:   Standing    Number of Occurrences:   2    Standing Expiration Date:   08/31/2017  . Vitamin B12    Standing Status:   Standing    Number of Occurrences:   2    Standing Expiration Date:   08/31/2017  . Folate    Standing Status:   Standing    Number of Occurrences:   2    Standing Expiration Date:   08/31/2017  . Sedimentation rate    Standing Status:   Standing    Number of Occurrences:   2    Standing Expiration Date:   08/31/2017  . C-reactive protein    Standing Status:   Standing    Number of Occurrences:   2    Standing Expiration Date:   08/31/2017   All questions were answered. The patient knows to call the clinic with any problems, questions or  concerns. We can certainly see the patient much sooner if necessary.  This document serves as a record of services personally performed by Ancil Linsey, MD. It was created on her behalf by Arlyce Harman, a trained medical scribe. The creation of this record is based on the scribe's personal observations and the provider's statements to them. This document has been checked and approved by the attending provider.  I have reviewed the above documentation for accuracy and completeness and I agree with the above.  This note is electronically signed by:  Molli Hazard, MD  05/31/2016 2:14 PM

## 2016-05-31 NOTE — Patient Instructions (Signed)
[place] at [facility] Discharge Instructions  RECOMMENDATIONS MADE BY THE CONSULTANT AND ANY TEST RESULTS WILL BE SENT TO YOUR REFERRING PHYSICIAN.  Exam done and seen today by Dr. Whitney Muse Labs in 3 months, 6 months Return to see the doctor in 6 months Please call the clinic if you have any questions or concerns  Thank you for choosing [place] at [facility] to provide your oncology and hematology care.  To afford each patient quality time with our provider, please arrive at least 15 minutes before your scheduled appointment time.   Beginning January 23rd 2017 lab work for the [facility] will be done in the  Main lab at [facility] on 1st floor. If you have a lab appointment with the [facility] please come in thru the  Main Entrance and check in at the main information desk  You need to re-schedule your appointment should you arrive 10 or more minutes late.  We strive to give you quality time with our providers, and arriving late affects you and other patients whose appointments are after yours.  Also, if you no show three or more times for appointments you may be dismissed from the clinic at the providers discretion.     Again, thank you for choosing [facility].  Our hope is that these requests will decrease the amount of time that you wait before being seen by our physicians.       _____________________________________________________________  Should you have questions after your visit to [facility], please contact our office at [phone] between the hours of 8:30 a.m. and 4:30 p.m.  Voicemails left after 4:30 p.m. will not be returned until the following business day.  For prescription refill requests, have your pharmacy contact our office.         Resources For Cancer Patients and their Caregivers ? American Cancer Society: Can assist with transportation, wigs, general needs, runs Look Good  Feel Better.        951-061-9358 ? Cancer Care: Provides financial assistance, online support groups, medication/co-pay assistance.  1-800-813-HOPE (765)111-8439) ? [place] Assists [employer] cancer patients and their families through emotional , educational and financial support.  (321) 830-1292 ? [place] DSS Where to apply for food stamps, Medicaid and utility assistance. 440-031-2242 ? [facility]: Transportation to medical appointments. (418) 546-5454 ? Social Security Administration: May apply for disability if have a Stage IV cancer. 6418808504 838-132-5583 ? [employer], Disability and Transit Services: Assists with nutrition, care and transit needs. [place] Support Programs: @10RELATIVEDAYS @ > Cancer Support Group  2nd Tuesday of the month 1pm-2pm, Journey Room  > Creative Journey  3rd Tuesday of the month 1130am-1pm, Journey Room  > Look Good Feel Better  1st Wednesday of the month 10am-12 noon, Journey Room (Call [place] to register [phone])

## 2016-06-08 MED FILL — GABAPENTIN 100 MG CAPSULE: 100 | 30 days supply | Qty: 90 | Fill #2

## 2016-06-26 ENCOUNTER — Encounter (HOSPITAL_COMMUNITY): Payer: Self-pay | Admitting: Hematology & Oncology

## 2016-06-26 DIAGNOSIS — N951 Menopausal and female climacteric states: Secondary | ICD-10-CM | POA: Insufficient documentation

## 2016-06-26 MED FILL — ESTRADIOL PATCH 0.0375: 0.0375 | 28 days supply | Qty: 8 | Fill #0

## 2016-06-30 ENCOUNTER — Other Ambulatory Visit: Payer: Self-pay | Admitting: Family Medicine

## 2016-06-30 MED FILL — DILTIAZEM HCL ER 360 MG CAP: 360 | 90 days supply | Qty: 90 | Fill #0

## 2016-06-30 MED FILL — FUROSEMIDE 20 MG TABLET: 20 | 90 days supply | Qty: 270 | Fill #0

## 2016-07-01 DIAGNOSIS — I10 Essential (primary) hypertension: Secondary | ICD-10-CM | POA: Diagnosis not present

## 2016-07-01 DIAGNOSIS — E1122 Type 2 diabetes mellitus with diabetic chronic kidney disease: Secondary | ICD-10-CM | POA: Diagnosis not present

## 2016-07-01 DIAGNOSIS — E1129 Type 2 diabetes mellitus with other diabetic kidney complication: Secondary | ICD-10-CM | POA: Diagnosis not present

## 2016-07-01 DIAGNOSIS — E559 Vitamin D deficiency, unspecified: Secondary | ICD-10-CM | POA: Diagnosis not present

## 2016-07-01 DIAGNOSIS — N189 Chronic kidney disease, unspecified: Secondary | ICD-10-CM | POA: Diagnosis not present

## 2016-07-01 LAB — COMPLETE METABOLIC PANEL WITH GFR
ALT: 9 U/L (ref 6–29)
AST: 11 U/L (ref 10–35)
Albumin: 3.9 g/dL (ref 3.6–5.1)
Alkaline Phosphatase: 86 U/L (ref 33–130)
BUN: 17 mg/dL (ref 7–25)
CO2: 26 mmol/L (ref 20–31)
Calcium: 8.6 mg/dL (ref 8.6–10.4)
Chloride: 103 mmol/L (ref 98–110)
Creat: 1 mg/dL (ref 0.50–1.05)
GFR, Est African American: 73 mL/min (ref >=60)
GFR, Est Non African American: 63 mL/min (ref >=60)
Glucose, Bld: 133 mg/dL — ABNORMAL HIGH (ref 65–99)
Potassium: 4.6 mmol/L (ref 3.5–5.3)
Sodium: 138 mmol/L (ref 135–146)
Total Bilirubin: 0.5 mg/dL (ref 0.2–1.2)
Total Protein: 6.5 g/dL (ref 6.1–8.1)

## 2016-07-02 LAB — TSH: TSH: 2.35 mIU/L

## 2016-07-03 LAB — HEMOGLOBIN A1C
Hgb A1c MFr Bld: 7 % — ABNORMAL HIGH (ref <5.7)
Mean Plasma Glucose: 154 mg/dL

## 2016-07-03 LAB — VITAMIN D 25 HYDROXY (VIT D DEFICIENCY, FRACTURES): Vit D, 25-Hydroxy: 48 ng/mL (ref 30–100)

## 2016-07-05 ENCOUNTER — Ambulatory Visit (INDEPENDENT_AMBULATORY_CARE_PROVIDER_SITE_OTHER): Payer: 59 | Admitting: Family Medicine

## 2016-07-05 ENCOUNTER — Encounter: Payer: Self-pay | Admitting: Family Medicine

## 2016-07-05 VITALS — BP 118/78 | HR 89 | Resp 16 | Ht 65.0 in | Wt 313.4 lb

## 2016-07-05 DIAGNOSIS — I10 Essential (primary) hypertension: Secondary | ICD-10-CM | POA: Diagnosis not present

## 2016-07-05 DIAGNOSIS — E785 Hyperlipidemia, unspecified: Secondary | ICD-10-CM | POA: Diagnosis not present

## 2016-07-05 DIAGNOSIS — J302 Other seasonal allergic rhinitis: Secondary | ICD-10-CM

## 2016-07-05 DIAGNOSIS — E1122 Type 2 diabetes mellitus with diabetic chronic kidney disease: Secondary | ICD-10-CM

## 2016-07-05 MED ORDER — MONTELUKAST SODIUM 10 MG PO TABS: 10.0000 mg | ORAL_TABLET | Freq: Every day | ORAL | 1 refills | Status: DC

## 2016-07-05 MED ORDER — AZELASTINE HCL 0.1 % NA SOLN: 2.0000 | Freq: Two times a day (BID) | NASAL | 1 refills | Status: DC

## 2016-07-05 MED FILL — AZELASTINE HCL 137 MCG SPRY: 0.1 | 90 days supply | Qty: 90 | Fill #0

## 2016-07-05 MED FILL — MONTELUKAST SOD 10 MG TAB: 10 | 90 days supply | Qty: 90 | Fill #0

## 2016-07-05 NOTE — Assessment & Plan Note (Addendum)
uncontrolled, add daily astelin and singulair , netty pot flushes also, hold off on allegra

## 2016-07-05 NOTE — Patient Instructions (Addendum)
F/u in 3.5 month, call if you need me before  cONGRATS on iMPROVED blood sugar, excellent blood pressure and cholesterol.  Work on daily exercise commitment and increased vegetable intake  Fasting lipid, cmp and eGFR and hBA1c for dec  Visit  New medication for cough is Astelin, and Singulair, continue flonase, and add daily netty pot  If cough continues after 3 weeks resume prevacid daily  If still continues , call , I will refer you to lung specialist  Good foot exam today Thank you  for choosing [facility]. We consider it a privelige to serve you.  Delivering excellent health care in a caring and  compassionate way is our goal.  Partnering with you,  so that together we can achieve this goal is our strategy.

## 2016-07-10 ENCOUNTER — Other Ambulatory Visit: Payer: Self-pay | Admitting: *Deleted

## 2016-07-10 NOTE — Patient Outreach (Signed)
[facility]) Care Management   07/10/2016  Kendra Hanson [birth year] [id number]  Kendra Hanson is an 55 y.o. female who presents to the [place] Management office for routine Link To Wellness follow up for self management assistance with Type II DM, HTN and hyperlipidemia and morbid obesity.  Subjective: Kendra Hanson says she saw Dr. Moshe Cipro on 8/23 and her Hgb A1C was 7.0%, down from 7.7% on 03/10/16. She attributes her decreased Hgb A1C to walking twice weekly and she has stopped consuming liquids with sugar. Says she now drinks only water with Crystal Light as she does not like the taste of plain water.  She only says she is being treated for a chronic cough with Astelin nasal spray and a nettie pot  and may need to see a pulmonologist if it does not resolve. She denies a history of asthma but does have a history of second hand smoke exposure.  She says she has not required iron infusions since June and will see her hematologist, Dr. Whitney Muse on 12/01/16. She is requesting her mammogram report of 11/29/15 be faxed to Dr. Kendall Flack, her gynecologist.   Objective:   Review of Systems  Constitutional: Negative.     Physical Exam  Constitutional: She is oriented to person, place, and time. She appears well-developed and well-nourished.  Respiratory: Effort normal.  Neurological: She is alert and oriented to person, place, and time.  Skin: Skin is warm and dry.  Psychiatric: She has a normal mood and affect. Her behavior is normal. Judgment and thought content normal.   Vitals:   07/10/16 1506  BP: 114/78  refused weight   Encounter Medications:   Outpatient Encounter Prescriptions as of 07/10/2016  Medication Sig Note  . azelastine (ASTELIN) 0.1 % nasal spray Place 2 sprays into both nostrils 2 (two) times daily. Use in each nostril as directed   . diltiazem (TIAZAC) 360 MG 24 hr capsule TAKE 1 CAPSULE BY MOUTH DAILY.   Marland Kitchen estradiol  (VIVELLE-DOT) 0.0375 MG/24HR Place 1 patch onto the skin 3 (three) times a week. Monday, Wednesday, and Saturday.   Marland Kitchen exenatide (BYETTA) 10 MCG/0.04ML SOPN injection Inject 0.04 mLs (10 mcg total) into the skin 2 (two) times daily with a meal.   . fluticasone (FLONASE) 50 MCG/ACT nasal spray Place 2 sprays into both nostrils daily. (Patient taking differently: Place 2 sprays into both nostrils daily as needed for allergies. ) 03/22/2016: Uses every day  . furosemide (LASIX) 20 MG tablet TAKE 1 TABLET (20 MG TOTAL) BY MOUTH 3 (THREE) TIMES DAILY.   Marland Kitchen gabapentin (NEURONTIN) 100 MG capsule Take 200 mg by mouth daily. 03/22/2016: Taking 100 mg   . Lancet Device MISC Use with verio IQ meter three times daily to test blood sugar    . LANTUS SOLOSTAR 100 UNIT/ML Solostar Pen INJECT 60 UNITS UNDER THE SKIN DAILY   . loratadine (CLARITIN) 10 MG tablet Take 10 mg by mouth daily as needed for allergies.  03/22/2016: Takes everyday  . losartan (COZAAR) 100 MG tablet TAKE 1 TABLET BY MOUTH DAILY.   . metFORMIN (GLUCOPHAGE) 1000 MG tablet TAKE 1 TABLET BY MOUTH 2 TIMES DAILY WITH A MEAL   . montelukast (SINGULAIR) 10 MG tablet Take 1 tablet (10 mg total) by mouth at bedtime.   . naproxen sodium (ANAPROX) 220 MG tablet Take 220 mg by mouth daily as needed (pain).   . NOVOLOG FLEXPEN 100 UNIT/ML FlexPen INJECT 12 UNITS IN THE  MORNING, 10 UNITS AT LUNCHTIME , AND 12 UNITS AT BEDTIME AS DIRECTED   . ondansetron (ZOFRAN) 4 MG tablet Take 1 tablet (4 mg total) by mouth 2 (two) times daily as needed for nausea or vomiting.   . pantoprazole (PROTONIX) 20 MG tablet TAKE 1 TABLET BY MOUTH EVERY DAY   . potassium chloride (K-DUR,KLOR-CON) 10 MEQ tablet Take 1 tablet (10 mEq total) by mouth 3 (three) times daily. (Patient taking differently: Take 10 mEq by mouth 2 (two) times daily. )   . rosuvastatin (CRESTOR) 10 MG tablet TAKE 1 TABLET BY MOUTH ONCE DAILY AT BEDTIME   . UNIFINE PENTIPS 31G X 8 MM MISC USE AS DIRECTED   .  vitamin B-12 (CYANOCOBALAMIN) 100 MCG tablet Take 100 mcg by mouth daily.   . Vitamin D, Ergocalciferol, (DRISDOL) 50000 units CAPS capsule TAKE 1 CAPSULE (50,000 UNITS TOTAL) BY MOUTH ONCE A WEEK.    No facility-administered encounter medications on file as of 07/10/2016.     Functional Status:   In your present state of health, do you have any difficulty performing the following activities: 12/08/2015  Hearing? N  Vision? N  Difficulty concentrating or making decisions? N  Walking or climbing stairs? N  Dressing or bathing? N  Doing errands, shopping? N  Some recent data might be hidden    Fall/Depression Screening:    PHQ 2/9 Scores 03/22/2016 08/27/2015 04/26/2015 06/09/2014  PHQ - 2 Score 0 0 1 2  PHQ- 9 Score - - - 9    Assessment:   [employer] employee with Type II DM, HTN, hyperlipidemia and morbid obesity. Meeting treatment goals for HTN, lipids with improved glycemic control with Hgb A1C= 7.0% on 07/01/16.  Plan: [facility] CM Care Plan Problem One   Flowsheet Row Most Recent Value  Care Plan Problem One Type II DM, HTN, hyperlipidemia and morbid obesity - current body mas index= 52.3, improved glycemic control as evidenced by Hgb A1C= 7.0% on 07/01/16 previously 7.7% on 03/10/16, meeting treatment targets for lipids and HTN as evidenced by normal lipid profile on 03/10/16 and BP readings <140/<90  Role Documenting the Problem One  Care Management [place] for Problem One  Active  [facility] Long Term Goal (31-90 days) Ongoing Improvement of  glycemic control as evidenced by improved Hgb A1C <7.0% at next assessment without episodes of hypoglycemia, ongoing good control of HTN and hyperlipidemia as evidenced by normal lipid panel and BP readings <140/<90 consistently and evidence of weight loss or no weight gain at next Link To Wellness visit  [place] Term Goal Start Date  07/10/16  [facility] Long Term Goal Met Date   Interventions for Problem One Long Term Goal  reviewed basic  metabolic deficits in Type II DM, reviewed DM medications mechanism of action, again cautioned Kendra Hanson and not to take novolog prior to going to bed, again reminded Kendra Hanson to take Byetta twice daily as directed as she is only taking it daily or ask Dr. Moshe Cipro to change to Victoza which is dosed daily to improve medication adherence, reviewed  goals set with Dr. Moshe Cipro on 8/23 office visit related to increasing exercise frequency to daily and eating more vegetables to assist with weight loss,  emphasized importance of eliminating lows so she does not require extra calories to treat the lows, suggested she keep a food journal to assess calorie and CHO intake and then adjust her  meal plan to meed the goals set with RD in October 2016 visit, reminded Kendra Hanson to  check CBG in pairs at supper and record food intake at supper, assisted Kendra Hanson with reviewing her status in the [place], congratulated her on getting her annual eye check, ensured all other health maintenance checks are up to date, discussed changes to the Link To Wellness program in 2018 and encouraged her to attend her on-site benefits fair in October for specific details, faxed mammogram report of 11/29/15  to Dr. Leo Grosser at [facility] request,  will arrange  for Link To Wellness follow up in December 2017     RNCM to fax today's office visit note to Dr. Moshe Cipro. RNCM will meet quarterly and as needed with patient per Link To Wellness program guidelines to assist with Type II DM, HTN, hyperlipidemia and morbid obesity self-management and assess patient's progress toward mutually set goals.  Barrington Ellison RN,CCM,CDE [place] Management Coordinator Link To Wellness Office Phone [phone] Office Fax [phone]

## 2016-07-17 ENCOUNTER — Encounter: Payer: Self-pay | Admitting: Family Medicine

## 2016-07-17 NOTE — Assessment & Plan Note (Signed)
Hyperlipidemia:Low fat diet discussed and encouraged.   Lipid Panel  Lab Results  Component Value Date   CHOL 140 03/10/2016   HDL 46 03/10/2016   LDLCALC 85 03/10/2016   TRIG 47 03/10/2016   CHOLHDL 3.0 03/10/2016     Updated lab needed at/ before next visit.

## 2016-07-17 NOTE — Assessment & Plan Note (Signed)
Controlled, no change in medication DASH diet and commitment to daily physical activity for a minimum of 30 minutes discussed and encouraged, as a part of hypertension management. The importance of attaining a healthy weight is also discussed.  BP/Weight 07/10/2016 07/05/2016 05/31/2016 03/27/2016 03/22/2016 03/21/2016 123XX123  Systolic BP 99991111 123456 123456 [id number]  Diastolic BP 78 78 51 64 80 69 53  Wt. (Lbs) - 313.4 315.6 311 314 - -  BMI - 52.15 52.52 51.75 53.87 - -

## 2016-07-17 NOTE — Progress Notes (Signed)
Kendra Hanson     MRN: [id number]      DOB: 11-Jul-1961   HPI Kendra Hanson is here for follow up and re-evaluation of chronic medical conditions, medication management and review of any available recent lab and radiology data.  Preventive health is updated, specifically  Cancer screening and Immunization.   Questions or concerns regarding consultations or procedures which the PT has had in the interim are  addressed. The PT denies any adverse reactions to current medications since the last visit.  3 week h/o cough, esp when lying down, no fever or chills, increased nasal congestion  Denies polyuria, polydipsia, blurred vision , or hypoglycemic episodes. No regular exercise commitment yet, was having problem with feet  ROS Denies recent fever or chills. Denies sinus pressure,  ear pain or sore throat. Denies chest congestion, productive cough or wheezing. Denies chest pains, palpitations and leg swelling Denies abdominal pain, nausea, vomiting,diarrhea or constipation.   Denies dysuria, frequency, hesitancy or incontinence. Denies uncontrolled joint pain, swelling and limitation in mobility. Denies headaches, seizures, numbness, or tingling. Denies depression, anxiety or insomnia. Denies skin break down or rash.   PE  BP 118/78   Pulse 89   Resp 16   Ht 5\' 5"  (1.651 m)   Wt (!) 313 lb 6.4 oz (142.2 kg)   SpO2 93%   BMI 52.15 kg/m   Patient alert and oriented and in no cardiopulmonary distress.  HEENT: No facial asymmetry, EOMI,   oropharynx pink and moist.  Neck supple no JVD, no mass.  Chest: Clear to auscultation bilaterally.  CVS: S1, S2 no murmurs, no S3.Regular rate.  ABD: Soft non tender.   Ext: No edema  MS: Adequate though reduced  ROM spine, shoulders, hips and knees.  Skin: Intact, no ulcerations or rash noted.  Psych: Good eye contact, normal affect. Memory intact not anxious or depressed appearing.  CNS: CN 2-12 intact, power,  normal throughout.no  focal deficits noted.   Assessment & Plan  Seasonal allergies uncontrolled, add daily astelin and singulair , netty pot flushes also, hold off on allegra  Type II diabetes mellitus with renal manifestations Improved and cpontrolled Kendra Hanson is reminded of the importance of commitment to daily physical activity for 30 minutes or more, as able and the need to limit carbohydrate intake to 30 to 60 grams per meal to help with blood sugar control.   The need to take medication as prescribed, test blood sugar as directed, and to call between visits if there is a concern that blood sugar is uncontrolled is also discussed.   Kendra Hanson is reminded of the importance of daily foot exam, annual eye examination, and good blood sugar, blood pressure and cholesterol control.  Diabetic Labs Latest Ref Rng & Units 07/01/2016 03/10/2016 11/29/2015 08/06/2015 04/22/2015  HbA1c <5.7 % 7.0(H) 7.7(H) 7.6(H) 7.4(H) 8.0(H)  Microalbumin Not Estab. ug/mL - 110.4(H) - - -  Micro/Creat Ratio 0.0 - 30.0 mg/g creat - 82.1(H) - - -  Chol 0 - 200 mg/dL - 140 - 146 -  HDL >40 mg/dL - 46 - 48 -  Calc LDL 0 - 99 mg/dL - 85 - 84 -  Triglycerides <150 mg/dL - 47 - 72 -  Creatinine 0.50 - 1.05 mg/dL 1.00 1.04(H) 1.03 1.03 1.09   BP/Weight 07/10/2016 07/05/2016 05/31/2016 03/27/2016 03/22/2016 03/21/2016 123XX123  Systolic BP 99991111 123456 123456 [id number] [id number]  Diastolic BP 78 78 51 64 80 69 53  Wt. (Lbs) -  313.4 315.6 311 314 - -  BMI - 52.15 52.52 51.75 53.87 - -   Foot/eye exam completion dates 07/05/2016 07/12/2015  Foot Form Completion Done Done        Essential hypertension Controlled, no change in medication DASH diet and commitment to daily physical activity for a minimum of 30 minutes discussed and encouraged, as a part of hypertension management. The importance of attaining a healthy weight is also discussed.  BP/Weight 07/10/2016 07/05/2016 05/31/2016 03/27/2016 03/22/2016 03/21/2016 [id number]  Systolic BP 99991111 123456 123456  [id number] [id number]  Diastolic BP 78 78 51 64 80 69 53  Wt. (Lbs) - 313.4 315.6 311 314 - -  BMI - 52.15 52.52 51.75 53.87 - -       Hyperlipidemia LDL goal <100 Hyperlipidemia:Low fat diet discussed and encouraged.   Lipid Panel  Lab Results  Component Value Date   CHOL 140 03/10/2016   HDL 46 03/10/2016   LDLCALC 85 03/10/2016   TRIG 47 03/10/2016   CHOLHDL 3.0 03/10/2016     Updated lab needed at/ before next visit.   Morbid obesity Deteriorated. Patient re-educated about  the importance of commitment to a  minimum of 150 minutes of exercise per week.  The importance of healthy food choices with portion control discussed. Encouraged to start a food diary, count calories and to consider  joining a support group. Sample diet sheets offered. Goals set by the patient for the next several months.   Weight /BMI 07/10/2016 07/05/2016 05/31/2016  WEIGHT - 313 lb 6.4 oz 315 lb 9.6 oz  HEIGHT 5\' 5"  5\' 5"  -  BMI - 52.15 kg/m2 52.52 kg/m2

## 2016-07-17 NOTE — Assessment & Plan Note (Signed)
Deteriorated. Patient re-educated about  the importance of commitment to a  minimum of 150 minutes of exercise per week.  The importance of healthy food choices with portion control discussed. Encouraged to start a food diary, count calories and to consider  joining a support group. Sample diet sheets offered. Goals set by the patient for the next several months.   Weight /BMI 07/10/2016 07/05/2016 05/31/2016  WEIGHT - 313 lb 6.4 oz 315 lb 9.6 oz  HEIGHT 5\' 5"  5\' 5"  -  BMI - 52.15 kg/m2 52.52 kg/m2

## 2016-07-17 NOTE — Assessment & Plan Note (Signed)
Improved and cpontrolled Kendra Hanson is reminded of the importance of commitment to daily physical activity for 30 minutes or more, as able and the need to limit carbohydrate intake to 30 to 60 grams per meal to help with blood sugar control.   The need to take medication as prescribed, test blood sugar as directed, and to call between visits if there is a concern that blood sugar is uncontrolled is also discussed.   Kendra Hanson is reminded of the importance of daily foot exam, annual eye examination, and good blood sugar, blood pressure and cholesterol control.  Diabetic Labs Latest Ref Rng & Units 07/01/2016 03/10/2016 11/29/2015 08/06/2015 04/22/2015  HbA1c <5.7 % 7.0(H) 7.7(H) 7.6(H) 7.4(H) 8.0(H)  Microalbumin Not Estab. ug/mL - 110.4(H) - - -  Micro/Creat Ratio 0.0 - 30.0 mg/g creat - 82.1(H) - - -  Chol 0 - 200 mg/dL - 140 - 146 -  HDL >40 mg/dL - 46 - 48 -  Calc LDL 0 - 99 mg/dL - 85 - 84 -  Triglycerides <150 mg/dL - 47 - 72 -  Creatinine 0.50 - 1.05 mg/dL 1.00 1.04(H) 1.03 1.03 1.09   BP/Weight 07/10/2016 07/05/2016 05/31/2016 03/27/2016 03/22/2016 03/21/2016 123XX123  Systolic BP 99991111 123456 123456 [id number] [id number]  Diastolic BP 78 78 51 64 80 69 53  Wt. (Lbs) - 313.4 315.6 311 314 - -  BMI - 52.15 52.52 51.75 53.87 - -   Foot/eye exam completion dates 07/05/2016 07/12/2015  Foot Form Completion Done Done

## 2016-07-26 MED FILL — ESTRADIOL PATCH 0.0375: 0.0375 | 28 days supply | Qty: 8 | Fill #1

## 2016-07-27 ENCOUNTER — Other Ambulatory Visit: Payer: Self-pay

## 2016-07-27 MED ORDER — POTASSIUM CHLORIDE CRYS ER 10 MEQ PO TBCR: 10.0000 meq | EXTENDED_RELEASE_TABLET | Freq: Three times a day (TID) | ORAL | 1 refills | Status: DC

## 2016-07-27 MED FILL — KLOR-CON M10 TABLET: 10 | 90 days supply | Qty: 270 | Fill #0

## 2016-08-08 ENCOUNTER — Other Ambulatory Visit: Payer: Self-pay | Admitting: Family Medicine

## 2016-08-08 MED FILL — NOVOLOG FLEXPEN SYRINGE: 100 | 46 days supply | Qty: 15 | Fill #3

## 2016-08-08 MED FILL — LOSARTAN POTASSIUM 100 MG T: 100 | 90 days supply | Qty: 90 | Fill #0

## 2016-08-08 MED FILL — VIT D2 1.25 MG (50,000 UNIT: 1.25 MG | 84 days supply | Qty: 12 | Fill #2

## 2016-08-10 DIAGNOSIS — Z01419 Encounter for gynecological examination (general) (routine) without abnormal findings: Secondary | ICD-10-CM | POA: Diagnosis not present

## 2016-08-10 DIAGNOSIS — N951 Menopausal and female climacteric states: Secondary | ICD-10-CM | POA: Diagnosis not present

## 2016-08-10 DIAGNOSIS — B373 Candidiasis of vulva and vagina: Secondary | ICD-10-CM | POA: Diagnosis not present

## 2016-08-10 DIAGNOSIS — Z6841 Body Mass Index (BMI) 40.0 and over, adult: Secondary | ICD-10-CM | POA: Diagnosis not present

## 2016-08-10 DIAGNOSIS — N898 Other specified noninflammatory disorders of vagina: Secondary | ICD-10-CM | POA: Diagnosis not present

## 2016-08-10 DIAGNOSIS — D259 Leiomyoma of uterus, unspecified: Secondary | ICD-10-CM | POA: Diagnosis not present

## 2016-08-10 DIAGNOSIS — Z113 Encounter for screening for infections with a predominantly sexual mode of transmission: Secondary | ICD-10-CM | POA: Diagnosis not present

## 2016-08-11 MED FILL — TERCONAZOLE 0.4% VAG CREAM: 0.4 | 7 days supply | Qty: 45 | Fill #0

## 2016-08-15 MED FILL — LUVENA VAGINAL MOISTURIZER: 21 days supply | Qty: 30 | Fill #0

## 2016-08-22 MED FILL — LANTUS SOLOSTAR 100 UNITS/M: 100 | 90 days supply | Qty: 54 | Fill #3

## 2016-08-23 MED FILL — metFORMIN HCL 1000 MG TABS: 1000 | 90 days supply | Qty: 180 | Fill #1

## 2016-08-23 MED FILL — ESTRADIOL PATCH 0.0375: 0.0375 | 28 days supply | Qty: 8 | Fill #0

## 2016-08-31 ENCOUNTER — Other Ambulatory Visit (HOSPITAL_COMMUNITY): Payer: 59

## 2016-08-31 ENCOUNTER — Encounter (HOSPITAL_COMMUNITY): Payer: 59 | Attending: Hematology & Oncology

## 2016-08-31 DIAGNOSIS — D649 Anemia, unspecified: Secondary | ICD-10-CM | POA: Insufficient documentation

## 2016-08-31 LAB — CBC WITH DIFFERENTIAL/PLATELET
Basophils Absolute: 0.1 10*3/uL (ref 0.0–0.1)
Basophils Relative: 1 %
Eosinophils Absolute: 0.3 10*3/uL (ref 0.0–0.7)
Eosinophils Relative: 3 %
HCT: 37.1 % (ref 36.0–46.0)
Hemoglobin: 12.1 g/dL (ref 12.0–15.0)
Lymphocytes Relative: 28 %
Lymphs Abs: 2.9 10*3/uL (ref 0.7–4.0)
MCH: 30.2 pg (ref 26.0–34.0)
MCHC: 32.6 g/dL (ref 30.0–36.0)
MCV: 92.5 fL (ref 78.0–100.0)
Monocytes Absolute: 0.8 10*3/uL (ref 0.1–1.0)
Monocytes Relative: 7 %
Neutro Abs: 6.4 10*3/uL (ref 1.7–7.7)
Neutrophils Relative %: 61 %
Platelets: 305 10*3/uL (ref 150–400)
RBC: 4.01 MIL/uL (ref 3.87–5.11)
RDW: 13.1 % (ref 11.5–15.5)
WBC: 10.5 10*3/uL (ref 4.0–10.5)

## 2016-08-31 LAB — VITAMIN B12: Vitamin B-12: 932 pg/mL — ABNORMAL HIGH (ref 180–914)

## 2016-08-31 LAB — SEDIMENTATION RATE: Sed Rate: 33 mm/hr — ABNORMAL HIGH (ref 0–22)

## 2016-08-31 LAB — C-REACTIVE PROTEIN: CRP: 2.1 mg/dL — ABNORMAL HIGH (ref <1.0)

## 2016-08-31 LAB — FOLATE: Folate: 9.7 ng/mL (ref >5.9)

## 2016-08-31 LAB — FERRITIN: Ferritin: 111 ng/mL (ref 11–307)

## 2016-09-19 MED FILL — NOVOLOG FLEXPEN SYRINGE: 100 | 46 days supply | Qty: 15 | Fill #4

## 2016-09-19 MED FILL — ESTRADIOL PATCH 0.0375: 0.0375 | 28 days supply | Qty: 8 | Fill #1

## 2016-09-28 ENCOUNTER — Telehealth: Payer: Self-pay | Admitting: Family Medicine

## 2016-09-28 MED FILL — DILTIAZEM HCL ER 360 MG CAP: 360 | 90 days supply | Qty: 90 | Fill #1

## 2016-09-28 MED FILL — MONTELUKAST SOD 10 MG TAB: 10 | 90 days supply | Qty: 90 | Fill #1

## 2016-09-28 MED FILL — FUROSEMIDE 20 MG TABLET: 20 | 90 days supply | Qty: 270 | Fill #1

## 2016-09-28 NOTE — Telephone Encounter (Signed)
Lenika is asking if Dr. Moshe Cipro would change her Crestor to another medication due to muscle aches please advise?

## 2016-09-29 ENCOUNTER — Other Ambulatory Visit: Payer: Self-pay

## 2016-09-29 ENCOUNTER — Other Ambulatory Visit: Payer: Self-pay | Admitting: Family Medicine

## 2016-09-29 MED ORDER — PRAVASTATIN SODIUM 40 MG PO TABS: 40.0000 mg | ORAL_TABLET | Freq: Every day | ORAL | 1 refills | Status: DC

## 2016-09-29 MED FILL — PRAVASTATIN NA 40 MG TAB: 40 | 90 days supply | Qty: 90 | Fill #0

## 2016-09-29 NOTE — Telephone Encounter (Signed)
Change to pravachol 40 mg , I have entered, pls let her know and saw

## 2016-09-29 NOTE — Telephone Encounter (Signed)
Called and left message for patient notifying of change in medication.

## 2016-09-29 NOTE — Telephone Encounter (Signed)
Please advise 

## 2016-10-13 ENCOUNTER — Telehealth: Payer: 59 | Admitting: Family

## 2016-10-13 DIAGNOSIS — B9689 Other specified bacterial agents as the cause of diseases classified elsewhere: Secondary | ICD-10-CM | POA: Diagnosis not present

## 2016-10-13 DIAGNOSIS — J329 Chronic sinusitis, unspecified: Secondary | ICD-10-CM | POA: Diagnosis not present

## 2016-10-13 MED ORDER — AMOXICILLIN-POT CLAVULANATE 875-125 MG PO TABS: 1.0000 | ORAL_TABLET | Freq: Two times a day (BID) | ORAL | 0 refills | Status: AC

## 2016-10-13 NOTE — Progress Notes (Signed)

## 2016-10-16 MED FILL — ESTRADIOL PATCH 0.0375: 0.0375 | 28 days supply | Qty: 8 | Fill #2

## 2016-10-20 ENCOUNTER — Other Ambulatory Visit: Payer: Self-pay | Admitting: Family Medicine

## 2016-10-20 DIAGNOSIS — E785 Hyperlipidemia, unspecified: Secondary | ICD-10-CM | POA: Diagnosis not present

## 2016-10-20 DIAGNOSIS — I10 Essential (primary) hypertension: Secondary | ICD-10-CM | POA: Diagnosis not present

## 2016-10-20 DIAGNOSIS — Z1231 Encounter for screening mammogram for malignant neoplasm of breast: Secondary | ICD-10-CM

## 2016-10-20 DIAGNOSIS — E1122 Type 2 diabetes mellitus with diabetic chronic kidney disease: Secondary | ICD-10-CM | POA: Diagnosis not present

## 2016-10-20 LAB — COMPLETE METABOLIC PANEL WITH GFR
ALT: 11 U/L (ref 6–29)
AST: 12 U/L (ref 10–35)
Albumin: 3.7 g/dL (ref 3.6–5.1)
Alkaline Phosphatase: 99 U/L (ref 33–130)
BUN: 17 mg/dL (ref 7–25)
CO2: 29 mmol/L (ref 20–31)
Calcium: 8.9 mg/dL (ref 8.6–10.4)
Chloride: 103 mmol/L (ref 98–110)
Creat: 1.12 mg/dL — ABNORMAL HIGH (ref 0.50–1.05)
GFR, Est African American: 63 mL/min (ref >=60)
GFR, Est Non African American: 55 mL/min — ABNORMAL LOW (ref >=60)
Glucose, Bld: 99 mg/dL (ref 65–99)
Potassium: 5 mmol/L (ref 3.5–5.3)
Sodium: 139 mmol/L (ref 135–146)
Total Bilirubin: 0.4 mg/dL (ref 0.2–1.2)
Total Protein: 6.7 g/dL (ref 6.1–8.1)

## 2016-10-20 LAB — LIPID PANEL
Cholesterol: 165 mg/dL (ref <200)
HDL: 42 mg/dL — ABNORMAL LOW (ref >50)
LDL Cholesterol: 110 mg/dL — ABNORMAL HIGH (ref <100)
Total CHOL/HDL Ratio: 3.9 Ratio (ref <5.0)
Triglycerides: 66 mg/dL (ref <150)
VLDL: 13 mg/dL (ref <30)

## 2016-10-21 LAB — HEMOGLOBIN A1C
Hgb A1c MFr Bld: 7.2 % — ABNORMAL HIGH (ref <5.7)
Mean Plasma Glucose: 160 mg/dL

## 2016-10-22 ENCOUNTER — Telehealth: Payer: 59 | Admitting: Family

## 2016-10-22 DIAGNOSIS — B373 Candidiasis of vulva and vagina: Secondary | ICD-10-CM | POA: Diagnosis not present

## 2016-10-22 DIAGNOSIS — B3731 Acute candidiasis of vulva and vagina: Secondary | ICD-10-CM

## 2016-10-22 MED ORDER — FLUCONAZOLE 150 MG PO TABS: 150.0000 mg | ORAL_TABLET | Freq: Once | ORAL | 0 refills | Status: AC

## 2016-10-22 NOTE — Progress Notes (Signed)
We are sorry that you are not feeling well. Here is how we plan to help! Based on what you shared with me it looks like you: May have a yeast vaginosis  Vaginosis is an inflammation of the vagina that can result in discharge, itching and pain. The cause is usually a change in the normal balance of vaginal bacteria or an infection. Vaginosis can also result from reduced estrogen levels after menopause.  The most common causes of vaginosis are:   Bacterial vaginosis which results from an overgrowth of one on several organisms that are normally present in your vagina.   Yeast infections which are caused by a naturally occurring fungus called candida.   Vaginal atrophy (atrophic vaginosis) which results from the thinning of the vagina from reduced estrogen levels after menopause.   Trichomoniasis which is caused by a parasite and is commonly transmitted by sexual intercourse.  Factors that increase your risk of developing vaginosis include: . Medications, such as antibiotics and steroids . Uncontrolled diabetes . Use of hygiene products such as bubble bath, vaginal spray or vaginal deodorant . Douching . Wearing damp or tight-fitting clothing . Using an intrauterine device (IUD) for birth control . Hormonal changes, such as those associated with pregnancy, birth control pills or menopause . Sexual activity . Having a sexually transmitted infection  Your treatment plan is A single Diflucan (fluconazole) 150mg tablet once.  I have electronically sent this prescription into the pharmacy that you have chosen.  Be sure to take all of the medication as directed. Stop taking any medication if you develop a rash, tongue swelling or shortness of breath. Mothers who are breast feeding should consider pumping and discarding their breast milk while on these antibiotics. However, there is no consensus that infant exposure at these doses would be harmful.  Remember that medication creams can weaken latex  condoms. .   HOME CARE:  Good hygiene may prevent some types of vaginosis from recurring and may relieve some symptoms:  . Avoid baths, hot tubs and whirlpool spas. Rinse soap from your outer genital area after a shower, and dry the area well to prevent irritation. Don't use scented or harsh soaps, such as those with deodorant or antibacterial action. . Avoid irritants. These include scented tampons and pads. . Wipe from front to back after using the toilet. Doing so avoids spreading fecal bacteria to your vagina.  Other things that may help prevent vaginosis include:  . Don't douche. Your vagina doesn't require cleansing other than normal bathing. Repetitive douching disrupts the normal organisms that reside in the vagina and can actually increase your risk of vaginal infection. Douching won't clear up a vaginal infection. . Use a latex condom. Both female and female latex condoms may help you avoid infections spread by sexual contact. . Wear cotton underwear. Also wear pantyhose with a cotton crotch. If you feel comfortable without it, skip wearing underwear to bed. Yeast thrives in moist environments Your symptoms should improve in the next day or two.  GET HELP RIGHT AWAY IF:  . You have pain in your lower abdomen ( pelvic area or over your ovaries) . You develop nausea or vomiting . You develop a fever . Your discharge changes or worsens . You have persistent pain with intercourse . You develop shortness of breath, a rapid pulse, or you faint.  These symptoms could be signs of problems or infections that need to be evaluated by a medical provider now.  MAKE SURE YOU      Understand these instructions.  Will watch your condition.  Will get help right away if you are not doing well or get worse.  Your e-visit answers were reviewed by a board certified advanced clinical practitioner to complete your personal care plan. Depending upon the condition, your plan could have included  both over the counter or prescription medications. Please review your pharmacy choice to make sure that you have choses a pharmacy that is open for you to pick up any needed prescription, Your safety is important to us. If you have drug allergies check your prescription carefully.   You can use MyChart to ask questions about today's visit, request a non-urgent call back, or ask for a work or school excuse for 24 hours related to this e-Visit. If it has been greater than 24 hours you will need to follow up with your provider, or enter a new e-Visit to address those concerns. You will get a MyChart message within the next two days asking about your experience. I hope that your e-visit has been valuable and will speed your recovery.  

## 2016-10-25 ENCOUNTER — Encounter: Payer: Self-pay | Admitting: Podiatry

## 2016-10-25 ENCOUNTER — Ambulatory Visit (INDEPENDENT_AMBULATORY_CARE_PROVIDER_SITE_OTHER): Payer: 59 | Admitting: Podiatry

## 2016-10-25 VITALS — BP 119/95 | HR 91 | Resp 18

## 2016-10-25 DIAGNOSIS — Q828 Other specified congenital malformations of skin: Secondary | ICD-10-CM

## 2016-10-25 DIAGNOSIS — B351 Tinea unguium: Secondary | ICD-10-CM | POA: Diagnosis not present

## 2016-10-25 DIAGNOSIS — M79673 Pain in unspecified foot: Secondary | ICD-10-CM | POA: Diagnosis not present

## 2016-10-25 MED FILL — TERCONAZOLE 0.4% VAG CREAM: 0.4 | 45 days supply | Qty: 45 | Fill #0

## 2016-10-25 NOTE — Patient Instructions (Signed)
The skin lesion on the bottom left foot is a corn-like growth, porokeratosis. Apply to mild facet to that area. If possible leave the bandage on 1-3 days. Return as needed for debridement of this area A diabetic foot screen today was satisfactory with adequate pulsations and feeling in your feet  Diabetes and Foot Care Diabetes may cause you to have problems because of poor blood supply (circulation) to your feet and legs. This may cause the skin on your feet to become thinner, break easier, and heal more slowly. Your skin may become dry, and the skin may peel and crack. You may also have nerve damage in your legs and feet causing decreased feeling in them. You may not notice minor injuries to your feet that could lead to infections or more serious problems. Taking care of your feet is one of the most important things you can do for yourself. Follow these instructions at home:  Wear shoes at all times, even in the house. Do not go barefoot. Bare feet are easily injured.  Check your feet daily for blisters, cuts, and redness. If you cannot see the bottom of your feet, use a mirror or ask someone for help.  Wash your feet with warm water (do not use hot water) and mild soap. Then pat your feet and the areas between your toes until they are completely dry. Do not soak your feet as this can dry your skin.  Apply a moisturizing lotion or petroleum jelly (that does not contain alcohol and is unscented) to the skin on your feet and to dry, brittle toenails. Do not apply lotion between your toes.  Trim your toenails straight across. Do not dig under them or around the cuticle. File the edges of your nails with an emery board or nail file.  Do not cut corns or calluses or try to remove them with medicine.  Wear clean socks or stockings every day. Make sure they are not too tight. Do not wear knee-high stockings since they may decrease blood flow to your legs.  Wear shoes that fit properly and have enough  cushioning. To break in new shoes, wear them for just a few hours a day. This prevents you from injuring your feet. Always look in your shoes before you put them on to be sure there are no objects inside.  Do not cross your legs. This may decrease the blood flow to your feet.  If you find a minor scrape, cut, or break in the skin on your feet, keep it and the skin around it clean and dry. These areas may be cleansed with mild soap and water. Do not cleanse the area with peroxide, alcohol, or iodine.  When you remove an adhesive bandage, be sure not to damage the skin around it.  If you have a wound, look at it several times a day to make sure it is healing.  Do not use heating pads or hot water bottles. They may burn your skin. If you have lost feeling in your feet or legs, you may not know it is happening until it is too late.  Make sure your health care provider performs a complete foot exam at least annually or more often if you have foot problems. Report any cuts, sores, or bruises to your health care provider immediately. Contact a health care provider if:  You have an injury that is not healing.  You have cuts or breaks in the skin.  You have an ingrown nail.  You notice redness on your legs or feet.  You feel burning or tingling in your legs or feet.  You have pain or cramps in your legs and feet.  Your legs or feet are numb.  Your feet always feel cold. Get help right away if:  There is increasing redness, swelling, or pain in or around a wound.  There is a red line that goes up your leg.  Pus is coming from a wound.  You develop a fever or as directed by your health care provider.  You notice a bad smell coming from an ulcer or wound. This information is not intended to replace advice given to you by your health care provider. Make sure you discuss any questions you have with your health care provider. Document Released: 10/27/2000 Document Revised: 04/06/2016  Document Reviewed: 04/08/2013 Elsevier Interactive Patient Education  2017 [employer].

## 2016-10-25 NOTE — Progress Notes (Signed)
   Subjective:    [name], female    DOB: Jan 29, 1961, 55 y.o.   MRN: [id number]  HPI   I NEED TO GET MY TOENAILS AND CALLUSES TRIMMED UP   Review of Systems  All other systems reviewed and are negative.      Objective:   Physical Exam        Assessment & Plan:

## 2016-10-26 ENCOUNTER — Ambulatory Visit (HOSPITAL_COMMUNITY)
Admission: RE | Admit: 2016-10-26 | Discharge: 2016-10-26 | Disposition: A | Discharge status: Home / Self Care | Payer: 59 | Source: Ambulatory Visit | Attending: Family Medicine | Admitting: Family Medicine

## 2016-10-26 ENCOUNTER — Ambulatory Visit (INDEPENDENT_AMBULATORY_CARE_PROVIDER_SITE_OTHER): Payer: 59 | Admitting: Family Medicine

## 2016-10-26 ENCOUNTER — Encounter: Payer: Self-pay | Admitting: Family Medicine

## 2016-10-26 VITALS — BP 120/80 | HR 86 | Resp 16 | Ht 63.0 in | Wt 314.0 lb

## 2016-10-26 DIAGNOSIS — E1122 Type 2 diabetes mellitus with diabetic chronic kidney disease: Secondary | ICD-10-CM | POA: Diagnosis not present

## 2016-10-26 DIAGNOSIS — I10 Essential (primary) hypertension: Secondary | ICD-10-CM | POA: Diagnosis not present

## 2016-10-26 DIAGNOSIS — E785 Hyperlipidemia, unspecified: Secondary | ICD-10-CM | POA: Diagnosis not present

## 2016-10-26 DIAGNOSIS — M542 Cervicalgia: Secondary | ICD-10-CM

## 2016-10-26 DIAGNOSIS — M5030 Other cervical disc degeneration, unspecified cervical region: Secondary | ICD-10-CM | POA: Insufficient documentation

## 2016-10-26 DIAGNOSIS — R600 Localized edema: Secondary | ICD-10-CM

## 2016-10-26 MED ORDER — CYCLOBENZAPRINE HCL 10 MG PO TABS: ORAL_TABLET | ORAL | 0 refills | Status: DC

## 2016-10-26 MED ORDER — PREDNISONE 5 MG PO TABS: 5.0000 mg | ORAL_TABLET | Freq: Two times a day (BID) | ORAL | 0 refills | Status: AC

## 2016-10-26 NOTE — Assessment & Plan Note (Signed)
Hyperlipidemia:Low fat diet discussed and encouraged.   Lipid Panel  Lab Results  Component Value Date   CHOL 165 10/20/2016   HDL 42 (L) 10/20/2016   LDLCALC 110 (H) 10/20/2016   TRIG 66 10/20/2016   CHOLHDL 3.9 10/20/2016  uncontrolled, no med change, dietary modification

## 2016-10-26 NOTE — Assessment & Plan Note (Signed)
Uncontrolled.Toradol and depo medrol administered IM in the office , to be followed by a short course of oral prednisone and flexeril, also x ray of C spine

## 2016-10-26 NOTE — Assessment & Plan Note (Signed)
Bilateral one to two plus leg edema, pt educated re need to take the lasix as prescribed, taking 2 instead of 3 daily, also reduce sodium and elevate legs

## 2016-10-26 NOTE — Assessment & Plan Note (Signed)
Controlled, no change in medication Ms. Aseltine is reminded of the importance of commitment to daily physical activity for 30 minutes or more, as able and the need to limit carbohydrate intake to 30 to 60 grams per meal to help with blood sugar control.   The need to take medication as prescribed, test blood sugar as directed, and to call between visits if there is a concern that blood sugar is uncontrolled is also discussed.   Ms. Bujak is reminded of the importance of daily foot exam, annual eye examination, and good blood sugar, blood pressure and cholesterol control.  Diabetic Labs Latest Ref Rng & Units 10/20/2016 07/01/2016 03/10/2016 11/29/2015 08/06/2015  HbA1c <5.7 % 7.2(H) 7.0(H) 7.7(H) 7.6(H) 7.4(H)  Microalbumin Not Estab. ug/mL - - 110.4(H) - -  Micro/Creat Ratio 0.0 - 30.0 mg/g creat - - 82.1(H) - -  Chol <200 mg/dL 165 - 140 - 146  HDL >50 mg/dL 42(L) - 46 - 48  Calc LDL <100 mg/dL 110(H) - 85 - 84  Triglycerides <150 mg/dL 66 - 47 - 72  Creatinine 0.50 - 1.05 mg/dL 1.12(H) 1.00 1.04(H) 1.03 1.03   BP/Weight 10/26/2016 10/25/2016 07/10/2016 07/05/2016 05/31/2016 03/27/2016 [id number]  Systolic BP 123456 123456 [id number] 123456 123456 [id number]  Diastolic BP 80 95 78 78 51 64 80  Wt. (Lbs) 314 - - 313.4 315.6 311 314  BMI 55.62 - - 52.15 52.52 51.75 53.87   Foot/eye exam completion dates 07/05/2016 07/12/2015  Foot Form Completion Done Done

## 2016-10-26 NOTE — Progress Notes (Signed)
Kendra Hanson     MRN: [id number]      DOB: [birth year]   HPI Ms. Cloutier is here for follow up and re-evaluation of chronic medical conditions, medication management and review of any available recent lab and radiology data.  Preventive health is updated, specifically  Cancer screening and Immunization.   Questions or concerns regarding consultations or procedures which the PT has had in the interim are  addressed. The PT denies any adverse reactions to current medications since the last visit.  4 day h/o left neck and left upper extremity pain and spasm radiating to hand, no inciting trauma, interfering with function and rest , has gabapentin and has been advised to resume one at night, will treat in office and short course of steroids being prescribed C/o bilateral leg swelling past 1 week, does not take lasix as prescribed Denies polyuria, polydipsia, blurred vision , or hypoglycemic episodes.    ROS Denies recent fever or chills. Denies sinus pressure, nasal congestion, ear pain or sore throat. Denies chest congestion, productive cough or wheezing. Denies chest pains, palpitations PND or orthopnea Denies abdominal pain, nausea, vomiting,diarrhea or constipation.   Denies dysuria, frequency, hesitancy or incontinence.  Denies headaches, seizures, numbness, or tingling. Denies depression, anxiety or insomnia. Denies skin break down or rash.   PE  BP 120/80   Pulse 86   Patient alert and oriented and in no cardiopulmonary distress.  HEENT: No facial asymmetry, EOMI,   oropharynx pink and moist.  Neck decreased ROM with left trapezius spasm,no JVD, no mass.  Chest: Clear to auscultation bilaterally.  CVS: S1, S2 no murmurs, no S3.Regular rate.  ABD: Soft non tender.   Ext: one plus pitting  edema  MS: Adequate though reduced  ROM spine, shoulders, hips and knees.  Skin: Intact, no ulcerations or rash noted.  Psych: Good eye contact, normal affect. Memory intact  not anxious or depressed appearing.  CNS: CN 2-12 intact, power,  normal throughout.no focal deficits noted.   Assessment & Plan  Essential hypertension Controlled, no change in medication DASH diet and commitment to daily physical activity for a minimum of 30 minutes discussed and encouraged, as a part of hypertension management. The importance of attaining a healthy weight is also discussed.  BP/Weight 10/26/2016 10/25/2016 07/10/2016 07/05/2016 05/31/2016 03/27/2016 [id number]  Systolic BP 123456 123456 [id number] 123456 123456 [id number]  Diastolic BP 80 95 78 78 51 64 80  Wt. (Lbs) 314 - - 313.4 315.6 311 314  BMI 55.62 - - 52.15 52.52 51.75 53.87       Type II diabetes mellitus with renal manifestations Controlled, no change in medication Ms. Dalsing is reminded of the importance of commitment to daily physical activity for 30 minutes or more, as able and the need to limit carbohydrate intake to 30 to 60 grams per meal to help with blood sugar control.   The need to take medication as prescribed, test blood sugar as directed, and to call between visits if there is a concern that blood sugar is uncontrolled is also discussed.   Ms. Knibbs is reminded of the importance of daily foot exam, annual eye examination, and good blood sugar, blood pressure and cholesterol control.  Diabetic Labs Latest Ref Rng & Units 10/20/2016 07/01/2016 03/10/2016 11/29/2015 08/06/2015  HbA1c <5.7 % 7.2(H) 7.0(H) 7.7(H) 7.6(H) 7.4(H)  Microalbumin Not Estab. ug/mL - - 110.4(H) - -  Micro/Creat Ratio 0.0 - 30.0 mg/g creat - - 82.1(H) - -  Chol <200  mg/dL 165 - 140 - 146  HDL >50 mg/dL 42(L) - 46 - 48  Calc LDL <100 mg/dL 110(H) - 85 - 84  Triglycerides <150 mg/dL 66 - 47 - 72  Creatinine 0.50 - 1.05 mg/dL 1.12(H) 1.00 1.04(H) 1.03 1.03   BP/Weight 10/26/2016 10/25/2016 07/10/2016 07/05/2016 05/31/2016 03/27/2016 [id number]  Systolic BP 123456 123456 [id number] 123456 123456 [id number]  Diastolic BP 80 95 78 78 51 64 80  Wt. (Lbs) 314 - - 313.4  315.6 311 314  BMI 55.62 - - 52.15 52.52 51.75 53.87   Foot/eye exam completion dates 07/05/2016 07/12/2015  Foot Form Completion Done Done        Morbid obesity Deteriorated. Patient re-educated about  the importance of commitment to a  minimum of 150 minutes of exercise per week.  The importance of healthy food choices with portion control discussed. Encouraged to start a food diary, count calories and to consider  joining a support group. Sample diet sheets offered. Goals set by the patient for the next several months.   Weight /BMI 10/26/2016 07/10/2016 07/05/2016  WEIGHT 314 lb - 313 lb 6.4 oz  HEIGHT 5\' 3"  5\' 5"  5\' 5"   BMI 55.62 kg/m2 - 52.15 kg/m2      Neck pain on left side Uncontrolled.Toradol and depo medrol administered IM in the office , to be followed by a short course of oral prednisone and flexeril, also x ray of C spine   Hyperlipidemia LDL goal <100 Hyperlipidemia:Low fat diet discussed and encouraged.   Lipid Panel  Lab Results  Component Value Date   CHOL 165 10/20/2016   HDL 42 (L) 10/20/2016   LDLCALC 110 (H) 10/20/2016   TRIG 66 10/20/2016   CHOLHDL 3.9 10/20/2016  uncontrolled, no med change, dietary modification

## 2016-10-26 NOTE — Assessment & Plan Note (Signed)
Deteriorated. Patient re-educated about  the importance of commitment to a  minimum of 150 minutes of exercise per week.  The importance of healthy food choices with portion control discussed. Encouraged to start a food diary, count calories and to consider  joining a support group. Sample diet sheets offered. Goals set by the patient for the next several months.   Weight /BMI 10/26/2016 07/10/2016 07/05/2016  WEIGHT 314 lb - 313 lb 6.4 oz  HEIGHT 5\' 3"  5\' 5"  5\' 5"   BMI 55.62 kg/m2 - 52.15 kg/m2

## 2016-10-26 NOTE — Patient Instructions (Addendum)
F/u in 3 months, call if you need me before  Fasting labs in 3 month   Injectionsn in office today for neck pain , and 5 day course of prednisone is prescribed and a muscle relaxer Need x ray of neck, pain likely from pinched nerve in neck  You will be referred to cardiologist if leg swelling persists, NEED to take correct lasix dose, 3 daily   Please work on good  health habits so that your health will improve. 1. Commitment to daily physical activity for 30 to 60  minutes, if you are able to do this.  2. Commitment to wise food choices. Aim for half of your  food intake to be vegetable and fruit, one quarter starchy foods, and one quarter protein. Try to eat on a regular schedule  3 meals per day, snacking between meals should be limited to vegetables or fruits or small portions of nuts. 64 ounces of water per day is generally recommended, unless you have specific health conditions, like heart failure or kidney failure where you will need to limit fluid intake.  3. Commitment to sufficient and a  good quality of physical and mental rest daily, generally between 6 to 8 hours per day.  WITH PERSISTANCE AND PERSEVERANCE, THE IMPOSSIBLE , BECOMES THE NORM! Thank you  for choosing [facility]. We consider it a privelige to serve you.  Delivering excellent health care in a caring and  compassionate way is our goal.  Partnering with you,  so that together we can achieve this goal is our strategy.

## 2016-10-26 NOTE — Progress Notes (Signed)
[name], female   DOB: 30-Jun-1961, 55 y.o.   MRN: [id number]    Subjective: This diabetic patient presents today requesting trimming of her toenails which 3 cm long and thick and difficult to trim. Also patient complaining of painful plantar callus and requests debridement Patient denies history of amputation, claudication and ulceration other than a blister  Objective: Orientated 3 DP and PT pulses 2/4 bilaterally Capillary reflex immediate bilaterally Sensation to 10 g monofilament wire intact 5/5 bilaterally Auditory sensation reactive bilaterally Ankle reflex equal and reactive bilaterally Toenails are elongated, hypertrophic and deformed 6-10 Plantar callus third MPJ bilaterally Nucleated plantar lesion plantar lateral left Manual motor testing dorsi flexion, plantar flexion, inversion, eversion 5/5 bilaterally  Assessment: 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

## 2016-10-26 NOTE — Assessment & Plan Note (Signed)
Controlled, no change in medication DASH diet and commitment to daily physical activity for a minimum of 30 minutes discussed and encouraged, as a part of hypertension management. The importance of attaining a healthy weight is also discussed.  BP/Weight 10/26/2016 10/25/2016 07/10/2016 07/05/2016 05/31/2016 03/27/2016 [id number]  Systolic BP 123456 123456 [id number] 123456 123456 [id number]  Diastolic BP 80 95 78 78 51 64 80  Wt. (Lbs) 314 - - 313.4 315.6 311 314  BMI 55.62 - - 52.15 52.52 51.75 53.87

## 2016-10-27 ENCOUNTER — Encounter: Payer: Self-pay | Admitting: Family Medicine

## 2016-10-27 DIAGNOSIS — M542 Cervicalgia: Secondary | ICD-10-CM | POA: Diagnosis not present

## 2016-10-27 MED ORDER — METHYLPREDNISOLONE ACETATE 80 MG/ML IJ SUSP
80.0000 mg | Freq: Once | INTRAMUSCULAR | Status: AC
  Administered 2016-10-27: 80 mg via INTRAMUSCULAR

## 2016-10-27 MED ORDER — KETOROLAC TROMETHAMINE 60 MG/2ML IM SOLN
60.0000 mg | Freq: Once | INTRAMUSCULAR | Status: AC
  Administered 2016-10-27: 60 mg via INTRAMUSCULAR

## 2016-10-27 NOTE — Addendum Note (Signed)
Addended by: Eual Fines on: 10/27/2016 08:52 AM   Modules accepted: Orders

## 2016-10-30 ENCOUNTER — Other Ambulatory Visit: Payer: Self-pay | Admitting: *Deleted

## 2016-10-31 NOTE — Patient Outreach (Addendum)
[facility]) Care Management   10/31/2016  Kendra Hanson 1961/09/09 [id number]  Kendra Hanson is an 55 y.o. female who presents to the [facility] office for routine Link To Wellness follow up for self management assistance with Type II DM, HTN and hyperlipidemia and morbid obesity.  Subjective: Kendra Hanson says she saw Dr. Moshe Cipro on 12/14 and her Hgb A1C was 7.2%, up from 7.0% on 07/01/16.  She says she saw her podiatrist on 12/13 for toe nail trimming and foot care. She says she has not required iron infusions since June and will see her hematologist, Dr. Whitney Muse on 12/01/16. She says she does not think she has enrolled in the [facility] program.    Objective:   Review of Systems  Constitutional: Negative.     Physical Exam  Constitutional: She is oriented to person, place, and time. She appears well-developed and well-nourished.  Respiratory: Effort normal.  Neurological: She is alert and oriented to person, place, and time.  Skin: Skin is warm and dry.  Psychiatric: She has a normal mood and affect. Her behavior is normal. Judgment and thought content normal.   There were no vitals filed for this visit.refused weight   Encounter Medications:   Outpatient Encounter Prescriptions as of 10/30/2016  Medication Sig Note  . azelastine (ASTELIN) 0.1 % nasal spray Place 2 sprays into both nostrils 2 (two) times daily. Use in each nostril as directed   . cyclobenzaprine (FLEXERIL) 10 MG tablet One tablet at bedtime , as needed, for neck spasm   . diltiazem (TIAZAC) 360 MG 24 hr capsule TAKE 1 CAPSULE BY MOUTH DAILY.   Marland Kitchen estradiol (VIVELLE-DOT) 0.0375 MG/24HR Place 1 patch onto the skin 3 (three) times a week. Monday, Wednesday, and Saturday.   Marland Kitchen exenatide (BYETTA) 10 MCG/0.04ML SOPN injection Inject 0.04 mLs (10 mcg total) into the skin 2 (two) times daily with a meal. 07/10/2016: Uses once daily as she does not remember the second  dose  . furosemide (LASIX) 20 MG tablet TAKE 1 TABLET (20 MG TOTAL) BY MOUTH 3 (THREE) TIMES DAILY.   Marland Kitchen gabapentin (NEURONTIN) 100 MG capsule Take 200 mg by mouth daily. 03/22/2016: Taking 100 mg   . Lancet Device MISC Use with verio IQ meter three times daily to test blood sugar    . LANTUS SOLOSTAR 100 UNIT/ML Solostar Pen INJECT 60 UNITS UNDER THE SKIN DAILY   . loratadine (CLARITIN) 10 MG tablet Take 10 mg by mouth daily as needed for allergies.  07/10/2016: Not taking while on astelin  . losartan (COZAAR) 100 MG tablet TAKE 1 TABLET BY MOUTH DAILY.   . metFORMIN (GLUCOPHAGE) 1000 MG tablet TAKE 1 TABLET BY MOUTH 2 TIMES DAILY WITH A MEAL   . naproxen sodium (ANAPROX) 220 MG tablet Take 220 mg by mouth daily as needed (pain).   . NOVOLOG FLEXPEN 100 UNIT/ML FlexPen INJECT 12 UNITS IN THE MORNING, 10 UNITS AT LUNCHTIME , AND 12 UNITS AT BEDTIME AS DIRECTED   . ondansetron (ZOFRAN) 4 MG tablet Take 1 tablet (4 mg total) by mouth 2 (two) times daily as needed for nausea or vomiting.   . pantoprazole (PROTONIX) 20 MG tablet TAKE 1 TABLET BY MOUTH EVERY DAY 07/10/2016: Takes prn  . potassium chloride (K-DUR,KLOR-CON) 10 MEQ tablet Take 1 tablet (10 mEq total) by mouth 3 (three) times daily.   . pravastatin (PRAVACHOL) 40 MG tablet Take 1 tablet (40 mg total) by mouth daily.   . [  EXPIRED] predniSONE (DELTASONE) 5 MG tablet Take 1 tablet (5 mg total) by mouth 2 (two) times daily.   . vitamin B-12 (CYANOCOBALAMIN) 100 MCG tablet Take 100 mcg by mouth daily.   . Vitamin D, Ergocalciferol, (DRISDOL) 50000 units CAPS capsule TAKE 1 CAPSULE (50,000 UNITS TOTAL) BY MOUTH ONCE A WEEK.   . fluticasone (FLONASE) 50 MCG/ACT nasal spray Place 2 sprays into both nostrils daily. (Patient not taking: Reported on 10/30/2016) 07/10/2016: Not using while on astelin  . montelukast (SINGULAIR) 10 MG tablet Take 1 tablet (10 mg total) by mouth at bedtime.   Marland Kitchen UNIFINE PENTIPS 31G X 8 MM MISC USE AS DIRECTED    No  facility-administered encounter medications on file as of 10/30/2016.     Functional Status:   In your present state of health, do you have any difficulty performing the following activities: 10/30/2016 07/10/2016  Hearing? N N  Vision? N N  Difficulty concentrating or making decisions? N N  Walking or climbing stairs? N N  Dressing or bathing? N N  Doing errands, shopping? N N  Preparing Food and eating ? N -  Using the Toilet? N -  In the past six months, have you accidently leaked urine? N -  Do you have problems with loss of bowel control? N -  Managing your Medications? N -  Managing your Finances? N -  Housekeeping or managing your Housekeeping? N -  Some recent data might be hidden    Fall/Depression Screening:    PHQ 2/9 Scores 03/22/2016 08/27/2015 04/26/2015 06/09/2014  PHQ - 2 Score 0 0 1 2  PHQ- 9 Score - - - 9    Assessment:   [employer] employee with Type II DM, HTN, hyperlipidemia and morbid obesity. Meeting treatment goals for HTN, lipids, not meeting Hgb A1C target as evidenced by Hgb A1C= 7.2% on 10/20/16.  Plan: [facility] CM Care Plan Problem One   Flowsheet Row Most Recent Value  Care Plan Problem One Type II DM, HTN, hyperlipidemia and morbid obesity - current body mass index= 55.7, slightly worsened glycemic control as evidenced by Hgb A1C= 7.2% on 10/20/16 previously 7.0% on 07/01/16, meeting treatment targets for lipids and HTN as evidenced by normal lipid profile on 03/10/16 and BP readings <130/<80  Role Documenting the Problem One  Care Management [place] for Problem One  Active  [facility] Long Term Goal (31-90 days) Ongoing Improvement of  glycemic control as evidenced by improved Hgb A1C <7.0% at next assessment without episodes of hypoglycemia, ongoing good control of HTN and hyperlipidemia as evidenced by normal lipid panel and BP readings <130/<80 consistently and evidence of weight loss or no weight gain at next Link To Wellness visit  [place] Term  Goal Start Date  10/30/16  [facility] Long Term Goal Met Date   Interventions for Problem One Long Term Goal Reviewed DM medications and assessed medication adherence, explained to Lilias  that due to a [place] change Byetta will no longer be covered at 100% but that she has the option of changing to Victoza which would be covered at no cost under the Link To Wellness pharmacy benefit,  reviewed changes to the Link To Wellness program in 2018 and enrolled her in the [facility] and began to build her care plan in [place], clarified that ongoing disease management assistance will occur via [place] beginning in 2018     RNCM to fax today's office visit note to Dr. Moshe Cipro.  Barrington Ellison RN,CCM,CDE [place] Management Coordinator Link To Wellness Office Phone [phone] Office Fax [phone]

## 2016-11-06 ENCOUNTER — Other Ambulatory Visit: Payer: Self-pay | Admitting: Family Medicine

## 2016-11-06 ENCOUNTER — Telehealth: Payer: Self-pay | Admitting: Family Medicine

## 2016-11-06 NOTE — Telephone Encounter (Signed)
PLS LET PT KNOW I HEARD  FROM [facility], JANET, SHE WILL NEED TO CHANGE TO VICTOZA AS BYETTA NOT COVERED, I AM FINE WITH THIS AND HAVE ENTERED THE NEW MED , PLS SEND TO PHARMACY AFTER YOU SPEAK WITH HER, PLS ALSO SEND STAMPED D/C NOTE FOR BYETTA TO HER PHARMACY, MAY WRITE ON SCRIPT, I WILL ALSO TYPE THAT ON SCRIPT  ?? PLS ASK

## 2016-11-07 ENCOUNTER — Other Ambulatory Visit: Payer: Self-pay

## 2016-11-07 MED ORDER — LIRAGLUTIDE 18 MG/3ML ~~LOC~~ SOPN: 1.8000 mg | PEN_INJECTOR | Freq: Every day | SUBCUTANEOUS | 3 refills | Status: DC

## 2016-11-08 MED FILL — ESTRADIOL PATCH 0.0375: 0.0375 | 28 days supply | Qty: 8 | Fill #3

## 2016-11-10 ENCOUNTER — Telehealth: Payer: Self-pay | Admitting: Family Medicine

## 2016-11-10 ENCOUNTER — Other Ambulatory Visit: Payer: Self-pay | Admitting: Family Medicine

## 2016-11-10 NOTE — Telephone Encounter (Signed)
Pt to switch to victoza because of insurance coverage, she already has the medication, however, also has enough Byetta to cover her for an additional week. I have advised her to hold off starting the victoza until the week of 11/20/2016, when I am back in office. She will need to titrate up from,6 mg / day to 1.2 mg / day to 1.8 mg / day weekly, as tolerated I explained thadverse gI s/e potential, pt tends to be intolerant of med changes, as she has sufficient Byetta, I recommended that she wait until I am back in the office to switch to the [place]. Which has already been delivered

## 2016-11-14 ENCOUNTER — Other Ambulatory Visit: Payer: Self-pay

## 2016-11-14 MED ORDER — FREESTYLE LANCETS MISC: 12 refills | Status: DC

## 2016-11-14 MED ORDER — LIRAGLUTIDE 18 MG/3ML ~~LOC~~ SOPN: 1.8000 mg | PEN_INJECTOR | Freq: Every day | SUBCUTANEOUS | 3 refills | Status: DC

## 2016-11-14 MED ORDER — GLUCOSE BLOOD VI STRP: ORAL_STRIP | 12 refills | Status: DC

## 2016-11-14 MED FILL — FREESTYLE LITE TEST STRIP: 30 days supply | Qty: 100 | Fill #0

## 2016-11-14 MED FILL — VICTOZA 18 MG/3 ML INJECT P: 18 | 30 days supply | Qty: 9 | Fill #0

## 2016-11-14 MED FILL — FREESTYLE LANCETS: 30 days supply | Qty: 100 | Fill #0

## 2016-11-14 MED FILL — UNIFINE PENTIPS 8MM 31G: 31G X 8 MM | 90 days supply | Qty: 100 | Fill #1

## 2016-11-14 NOTE — Telephone Encounter (Signed)
Patient aware.

## 2016-11-20 ENCOUNTER — Ambulatory Visit (HOSPITAL_COMMUNITY)
Admission: RE | Admit: 2016-11-20 | Discharge: 2016-11-20 | Disposition: A | Discharge status: Home / Self Care | Payer: 59 | Source: Ambulatory Visit | Attending: Family Medicine | Admitting: Family Medicine

## 2016-11-20 DIAGNOSIS — Z1231 Encounter for screening mammogram for malignant neoplasm of breast: Secondary | ICD-10-CM | POA: Insufficient documentation

## 2016-11-22 ENCOUNTER — Other Ambulatory Visit: Payer: Self-pay

## 2016-11-22 MED ORDER — LANTUS SOLOSTAR 100 UNIT/ML ~~LOC~~ SOPN: PEN_INJECTOR | SUBCUTANEOUS | 12 refills | Status: DC

## 2016-11-22 MED FILL — FREESTYLE LITE METER: 30 days supply | Qty: 1 | Fill #0

## 2016-11-23 MED FILL — metFORMIN HCL 1000 MG TABS: 1000 | 90 days supply | Qty: 180 | Fill #2

## 2016-11-24 ENCOUNTER — Telehealth: Payer: Self-pay | Admitting: Family Medicine

## 2016-11-24 ENCOUNTER — Other Ambulatory Visit: Payer: Self-pay | Admitting: Family Medicine

## 2016-11-24 MED ORDER — INSULIN LISPRO PROT & LISPRO (75-25 MIX) 100 UNIT/ML KWIKPEN: PEN_INJECTOR | SUBCUTANEOUS | 3 refills | Status: DC

## 2016-11-24 NOTE — Telephone Encounter (Signed)
Please call pt , explain need to change from Novolog to humalog, due to formulary coverage.Fax in new script pls I am not changing the dose as they are very similar, and send in script please.  I had advised her not to start the Victoza , which is preferred over Lantus until I was back in the office. Pls advise her to best to come for appt just to discuss new meds , as victoza needs titration,and potential s/e, pls give appt next week

## 2016-11-25 ENCOUNTER — Telehealth: Payer: Self-pay | Admitting: Family Medicine

## 2016-11-25 ENCOUNTER — Other Ambulatory Visit: Payer: Self-pay | Admitting: Family Medicine

## 2016-11-25 NOTE — Telephone Encounter (Signed)
Pt called in requesting refill on lantus, I advised she was on victoza in place of lantus which was inaccurate, she is to start victoza in place of byetta I will refill her ;lantus , explain to her the fact the lantus continues and byetta is being replaced by victoza, she will be titrating the victoza dose from 0.6 units as tolerated

## 2016-11-26 ENCOUNTER — Telehealth: Payer: Self-pay | Admitting: Family Medicine

## 2016-11-26 NOTE — Telephone Encounter (Signed)
Direct contact made with pt to clarify medication. She DOES need a long acting insulin, and [facility]antus has [facility], she then advises that ins coverage has changed is her understanding and lantus will not be covered, she is to verify/ clarify this in am. I advised her victoza is to take the place of Byetta, and the  dose is to be titrated up from 0.6 to 122 to 1.8 daily, each dose taken over a 1 week period for better tolerance. She already has the new insulin mix and is taking this She knows to continue metformin

## 2016-11-27 ENCOUNTER — Other Ambulatory Visit: Payer: Self-pay

## 2016-11-27 MED ORDER — INSULIN GLARGINE 100 UNITS/ML SOLOSTAR PEN: 60.0000 [IU] | PEN_INJECTOR | Freq: Every day | SUBCUTANEOUS | 3 refills | Status: DC

## 2016-11-27 MED FILL — HUMALOG MIX 75-25 KWIKPEN: (75-25) 100 | 88 days supply | Qty: 30 | Fill #0

## 2016-11-27 MED FILL — LANTUS SOLOSTAR 100 UNITS/M: 100 | 75 days supply | Qty: 45 | Fill #0

## 2016-11-27 MED FILL — PREMARIN VAGINAL CREAM-APPL: 0.625 | 90 days supply | Qty: 30 | Fill #0 | Status: TO

## 2016-11-28 ENCOUNTER — Ambulatory Visit (INDEPENDENT_AMBULATORY_CARE_PROVIDER_SITE_OTHER): Payer: 59 | Admitting: Family Medicine

## 2016-11-28 VITALS — BP 118/74 | HR 90 | Resp 16 | Ht 63.0 in | Wt 314.0 lb

## 2016-11-28 DIAGNOSIS — Z79899 Other long term (current) drug therapy: Secondary | ICD-10-CM | POA: Diagnosis not present

## 2016-11-28 DIAGNOSIS — E1121 Type 2 diabetes mellitus with diabetic nephropathy: Secondary | ICD-10-CM

## 2016-11-28 DIAGNOSIS — Z794 Long term (current) use of insulin: Secondary | ICD-10-CM | POA: Diagnosis not present

## 2016-11-28 DIAGNOSIS — I10 Essential (primary) hypertension: Secondary | ICD-10-CM

## 2016-11-28 NOTE — Patient Instructions (Signed)
F/u as before  Call if you need me sooner   Fasting labs March 8 or after  Please work on good  health habits so that your health will improve. 1. Commitment to daily physical activity for 30 to 60  minutes, if you are able to do this.  2. Commitment to wise food choices. Aim for half of your  food intake to be vegetable and fruit, one quarter starchy foods, and one quarter protein. Try to eat on a regular schedule  3 meals per day, snacking between meals should be limited to vegetables or fruits or small portions of nuts. 64 ounces of water per day is generally recommended, unless you have specific health conditions, like heart failure or kidney failure where you will need to limit fluid intake.  3. Commitment to sufficient and a  good quality of physical and mental rest daily, generally between 6 to 8 hours per day.  WITH PERSISTANCE AND PERSEVERANCE, THE IMPOSSIBLE , BECOMES THE NORM!

## 2016-11-30 ENCOUNTER — Encounter: Payer: Self-pay | Admitting: Family Medicine

## 2016-11-30 ENCOUNTER — Encounter (HOSPITAL_COMMUNITY): Payer: 59 | Attending: Hematology & Oncology

## 2016-11-30 DIAGNOSIS — D649 Anemia, unspecified: Secondary | ICD-10-CM | POA: Insufficient documentation

## 2016-11-30 DIAGNOSIS — Z79899 Other long term (current) drug therapy: Secondary | ICD-10-CM | POA: Insufficient documentation

## 2016-11-30 LAB — VITAMIN B12: Vitamin B-12: 925 pg/mL — ABNORMAL HIGH (ref 180–914)

## 2016-11-30 LAB — CBC WITH DIFFERENTIAL/PLATELET
Basophils Absolute: 0.1 10*3/uL (ref 0.0–0.1)
Basophils Relative: 0 %
Eosinophils Absolute: 0.2 10*3/uL (ref 0.0–0.7)
Eosinophils Relative: 1 %
HCT: 36.8 % (ref 36.0–46.0)
Hemoglobin: 12.2 g/dL (ref 12.0–15.0)
Lymphocytes Relative: 26 %
Lymphs Abs: 3.1 10*3/uL (ref 0.7–4.0)
MCH: 29.8 pg (ref 26.0–34.0)
MCHC: 33.2 g/dL (ref 30.0–36.0)
MCV: 89.8 fL (ref 78.0–100.0)
Monocytes Absolute: 0.7 10*3/uL (ref 0.1–1.0)
Monocytes Relative: 6 %
Neutro Abs: 7.9 10*3/uL — ABNORMAL HIGH (ref 1.7–7.7)
Neutrophils Relative %: 67 %
Platelets: 329 10*3/uL (ref 150–400)
RBC: 4.1 MIL/uL (ref 3.87–5.11)
RDW: 13.6 % (ref 11.5–15.5)
WBC: 12 10*3/uL — ABNORMAL HIGH (ref 4.0–10.5)

## 2016-11-30 LAB — FERRITIN: Ferritin: 151 ng/mL (ref 11–307)

## 2016-11-30 LAB — C-REACTIVE PROTEIN: CRP: 2.7 mg/dL — ABNORMAL HIGH (ref <1.0)

## 2016-11-30 LAB — FOLATE: Folate: 12.2 ng/mL (ref >5.9)

## 2016-11-30 LAB — SEDIMENTATION RATE: Sed Rate: 50 mm/hr — ABNORMAL HIGH (ref 0–22)

## 2016-11-30 NOTE — Assessment & Plan Note (Signed)
Controlled, no change in medication DASH diet and commitment to daily physical activity for a minimum of 30 minutes discussed and encouraged, as a part of hypertension management. The importance of attaining a healthy weight is also discussed.  BP/Weight 11/28/2016 10/26/2016 10/25/2016 07/10/2016 07/05/2016 05/31/2016 [id number]  Systolic BP 123456 123456 123456 [id number] 123456 123456 [id number]  Diastolic BP 74 80 95 78 78 51 64  Wt. (Lbs) 314 314 - - 313.4 315.6 311  BMI 55.62 55.62 - - 52.15 52.52 51.75

## 2016-11-30 NOTE — Assessment & Plan Note (Signed)
Kendra Hanson is reminded of the importance of commitment to daily physical activity for 30 minutes or more, as able and the need to limit carbohydrate intake to 30 to 60 grams per meal to help with blood sugar control.   The need to take medication as prescribed, test blood sugar as directed, and to call between visits if there is a concern that blood sugar is uncontrolled is also discussed.   Kendra Hanson is reminded of the importance of daily foot exam, annual eye examination, and good blood sugar, blood pressure and cholesterol control.  Diabetic Labs Latest Ref Rng & Units 10/20/2016 07/01/2016 03/10/2016 11/29/2015 08/06/2015  HbA1c <5.7 % 7.2(H) 7.0(H) 7.7(H) 7.6(H) 7.4(H)  Microalbumin Not Estab. ug/mL - - 110.4(H) - -  Micro/Creat Ratio 0.0 - 30.0 mg/g creat - - 82.1(H) - -  Chol <200 mg/dL 165 - 140 - 146  HDL >50 mg/dL 42(L) - 46 - 48  Calc LDL <100 mg/dL 110(H) - 85 - 84  Triglycerides <150 mg/dL 66 - 47 - 72  Creatinine 0.50 - 1.05 mg/dL 1.12(H) 1.00 1.04(H) 1.03 1.03   BP/Weight 11/28/2016 10/26/2016 10/25/2016 07/10/2016 07/05/2016 05/31/2016 [id number]  Systolic BP 123456 123456 123456 [id number] 123456 123456 [id number]  Diastolic BP 74 80 95 78 78 51 64  Wt. (Lbs) 314 314 - - 313.4 315.6 311  BMI 55.62 55.62 - - 52.15 52.52 51.75   Foot/eye exam completion dates 07/05/2016 07/12/2015  Foot Form Completion Done Done      Improved

## 2016-11-30 NOTE — Progress Notes (Signed)
Kendra Hanson     MRN: [id number]      DOB: 11/09/1961   HPI Kendra Hanson is here for follow up and re-evaluation of chronic medical conditions, medication management . Was recently changed to victoza and humalog based on formulary changes, has been confused, has called in several times , still unclear , so she is brought in for face to face visit. Questions are addressed, her medications, which she has with her are brought in and the nurse has written clearly on the med boxes dosing instructions, leaves stating she fully understands. She knows to call in with questions or concerns regarding blood sugar ROS See HPI   PE  BP 118/74   Pulse 90   Resp 16   Ht 5\' 3"  (1.6 m)   Wt (!) 314 lb (142.4 kg)   SpO2 96%   BMI 55.62 kg/m   Patient alert and oriented and in no cardiopulmonary distress.     Assessment & Plan  Encounter for medication management Reviewed face to face with pt the new medications for her diabetes control and labeled the medications. She understands and will start her new medications now. She will call back with any concerns. The titrating dosing of victoza was explained and written , and I explained this is because of possibility of nausea. She has no  Known gall bladder disease or thyroid disease  Essential hypertension Controlled, no change in medication DASH diet and commitment to daily physical activity for a minimum of 30 minutes discussed and encouraged, as a part of hypertension management. The importance of attaining a healthy weight is also discussed.  BP/Weight 11/28/2016 10/26/2016 10/25/2016 07/10/2016 07/05/2016 05/31/2016 [id number]  Systolic BP 123456 123456 123456 [id number] 123456 123456 [id number]  Diastolic BP 74 80 95 78 78 51 64  Wt. (Lbs) 314 314 - - 313.4 315.6 311  BMI 55.62 55.62 - - 52.15 52.52 51.75       Type II diabetes mellitus with renal manifestations Kendra Hanson is reminded of the importance of commitment to daily physical activity for 30 minutes  or more, as able and the need to limit carbohydrate intake to 30 to 60 grams per meal to help with blood sugar control.   The need to take medication as prescribed, test blood sugar as directed, and to call between visits if there is a concern that blood sugar is uncontrolled is also discussed.   Kendra Hanson is reminded of the importance of daily foot exam, annual eye examination, and good blood sugar, blood pressure and cholesterol control.  Diabetic Labs Latest Ref Rng & Units 10/20/2016 07/01/2016 03/10/2016 11/29/2015 08/06/2015  HbA1c <5.7 % 7.2(H) 7.0(H) 7.7(H) 7.6(H) 7.4(H)  Microalbumin Not Estab. ug/mL - - 110.4(H) - -  Micro/Creat Ratio 0.0 - 30.0 mg/g creat - - 82.1(H) - -  Chol <200 mg/dL 165 - 140 - 146  HDL >50 mg/dL 42(L) - 46 - 48  Calc LDL <100 mg/dL 110(H) - 85 - 84  Triglycerides <150 mg/dL 66 - 47 - 72  Creatinine 0.50 - 1.05 mg/dL 1.12(H) 1.00 1.04(H) 1.03 1.03   BP/Weight 11/28/2016 10/26/2016 10/25/2016 07/10/2016 07/05/2016 05/31/2016 [id number]  Systolic BP 123456 123456 123456 [id number] 123456 123456 [id number]  Diastolic BP 74 80 95 78 78 51 64  Wt. (Lbs) 314 314 - - 313.4 315.6 311  BMI 55.62 55.62 - - 52.15 52.52 51.75   Foot/eye exam completion dates 07/05/2016 07/12/2015  Foot Form Completion Done Done  Improved

## 2016-11-30 NOTE — Assessment & Plan Note (Signed)
Reviewed face to face with pt the new medications for her diabetes control and labeled the medications. She understands and will start her new medications now. She will call back with any concerns. The titrating dosing of victoza was explained and written , and I explained this is because of possibility of nausea. She has no  Known gall bladder disease or thyroid disease

## 2016-12-01 ENCOUNTER — Encounter (HOSPITAL_BASED_OUTPATIENT_CLINIC_OR_DEPARTMENT_OTHER): Payer: 59 | Admitting: Hematology & Oncology

## 2016-12-01 ENCOUNTER — Other Ambulatory Visit (HOSPITAL_COMMUNITY): Payer: 59

## 2016-12-01 ENCOUNTER — Encounter (HOSPITAL_COMMUNITY): Payer: Self-pay | Admitting: Hematology & Oncology

## 2016-12-01 VITALS — HR 85 | Temp 98.7°F | Resp 20 | Wt 314.7 lb

## 2016-12-01 DIAGNOSIS — D649 Anemia, unspecified: Secondary | ICD-10-CM | POA: Diagnosis not present

## 2016-12-01 DIAGNOSIS — D72829 Elevated white blood cell count, unspecified: Secondary | ICD-10-CM | POA: Diagnosis not present

## 2016-12-01 DIAGNOSIS — E538 Deficiency of other specified B group vitamins: Secondary | ICD-10-CM | POA: Diagnosis not present

## 2016-12-01 DIAGNOSIS — E611 Iron deficiency: Secondary | ICD-10-CM | POA: Diagnosis not present

## 2016-12-01 NOTE — Progress Notes (Signed)
[facility] Hematology/Oncology Progress Note  Name: Kendra Hanson      MRN: [id number]     Date: 12/14/2016 Time:7:55 PM   REFERRING PHYSICIAN:  Tula Nakayama, MD   DIAGNOSIS: Low B12 Iron deficiency  Normocytic, normochromic anemia, S/P colonoscopy by Dr. Laural Golden on 04/05/2015  HISTORY OF PRESENT ILLNESS:   Kendra Hanson is a 57 yo black American female with a past medical history significant for DM type II, sleep apnea, morbid obesity, hyperlipidemia, GERD, HTN, and history of leiomyoma of uterus who is referred to [facility] for iron deficiency anemia by her primary care provider, Dr. Moshe Cipro.  Kendra Hanson is unaccompanied. I personally reviewed and went over laboratory studies with the patient.  Since our last visit, the patient had was sick for several days with nausea/vomiting and she believes it was food poisoning.  She had a cold recently.   She reports fatigue. She tries to stay active and does water aerobics at the [facility] with her husband.  Her diabetic medication was recently changed in the last week by her PCP Dr. Moshe Cipro. She continues to follow with Dr. Moshe Cipro regularly.  She denies blood in her stool or black tarry sticky stool. She has no concerns or complaints at this time.    PAST MEDICAL HISTORY:   Past Medical History:  Diagnosis Date  . Anemia   . Fibroids    Uterine  . GERD (gastroesophageal reflux disease)   . Hyperlipidemia 2008   Lipid profile in 04/2011:136, 53, 43  . Hypertension 2008   Normal CBC and CMet in 2012; negative stress nuclear in 2006- patient asymptomatic  . Insulin dependent diabetes mellitus ([place]) 1996  . Multiple allergies    perennial  . Normocytic normochromic anemia 12/27/2015  . Obesity   . PONV (postoperative nausea and vomiting)   . Sleep apnea    CPAP    ALLERGIES: No Known Allergies    MEDICATIONS: I have reviewed the patient's current medications.    Current Outpatient Prescriptions on File  Prior to Visit  Medication Sig Dispense Refill  . azelastine (ASTELIN) 0.1 % nasal spray Place 2 sprays into both nostrils 2 (two) times daily. Use in each nostril as directed 90 mL 1  . cyclobenzaprine (FLEXERIL) 10 MG tablet One tablet at bedtime , as needed, for neck spasm 30 tablet 0  . diltiazem (TIAZAC) 360 MG 24 hr capsule TAKE 1 CAPSULE BY MOUTH DAILY. 90 capsule PRN  . estradiol (VIVELLE-DOT) 0.0375 MG/24HR Place 1 patch onto the skin 3 (three) times a week. Monday, Wednesday, and Saturday.  12  . fluticasone (FLONASE) 50 MCG/ACT nasal spray Place 2 sprays into both nostrils daily. (Patient not taking: Reported on 10/30/2016) 48 g 1  . furosemide (LASIX) 20 MG tablet TAKE 1 TABLET (20 MG TOTAL) BY MOUTH 3 (THREE) TIMES DAILY. 270 tablet PRN  . gabapentin (NEURONTIN) 100 MG capsule Take 200 mg by mouth daily.    Marland Kitchen glucose blood (FREESTYLE TEST STRIPS) test strip Three times daily testing dx e11.65 100 each 12  . insulin glargine (LANTUS) 100 unit/mL SOPN Inject 0.6 mLs (60 Units total) into the skin at bedtime. 15 pen 3  . Insulin Lispro Prot & Lispro (HUMALOG MIX 75/25 KWIKPEN) (75-25) 100 UNIT/ML Kwikpen Inject 12 units in the morning, 10 units at lunchtime, and 12 units at bedtime 45 mL 3  . Lancets (FREESTYLE) lancets Use as instructed three times daily dx e11.65 100 each 12  .  liraglutide 18 MG/3ML SOPN Inject 0.3 mLs (1.8 mg total) into the skin daily. 90 mL 3  . loratadine (CLARITIN) 10 MG tablet Take 10 mg by mouth daily as needed for allergies.     Marland Kitchen losartan (COZAAR) 100 MG tablet TAKE 1 TABLET BY MOUTH DAILY. 90 tablet 1  . metFORMIN (GLUCOPHAGE) 1000 MG tablet TAKE 1 TABLET BY MOUTH 2 TIMES DAILY WITH A MEAL 180 tablet PRN  . montelukast (SINGULAIR) 10 MG tablet Take 1 tablet (10 mg total) by mouth at bedtime. 90 tablet 1  . naproxen sodium (ANAPROX) 220 MG tablet Take 220 mg by mouth daily as needed (pain).    . ondansetron (ZOFRAN) 4 MG tablet Take 1 tablet (4 mg total) by  mouth 2 (two) times daily as needed for nausea or vomiting. 30 tablet 0  . pantoprazole (PROTONIX) 20 MG tablet TAKE 1 TABLET BY MOUTH EVERY DAY 90 tablet 1  . potassium chloride (K-DUR,KLOR-CON) 10 MEQ tablet Take 1 tablet (10 mEq total) by mouth 3 (three) times daily. 270 tablet 1  . pravastatin (PRAVACHOL) 40 MG tablet Take 1 tablet (40 mg total) by mouth daily. 90 tablet 1  . UNIFINE PENTIPS 31G X 8 MM MISC USE AS DIRECTED 100 each 3  . vitamin B-12 (CYANOCOBALAMIN) 100 MCG tablet Take 100 mcg by mouth daily.    . Vitamin D, Ergocalciferol, (DRISDOL) 50000 units CAPS capsule TAKE 1 CAPSULE (50,000 UNITS TOTAL) BY MOUTH ONCE A WEEK. 12 capsule 3   No current facility-administered medications on file prior to visit.      PAST SURGICAL HISTORY Past Surgical History:  Procedure Laterality Date  . COLONOSCOPY N/A 04/05/2015   Procedure: COLONOSCOPY;  Surgeon: Rogene Houston, MD;  [facility];  Service: Endoscopy;  Laterality: N/A;  730  . DENTAL SURGERY    . ESOPHAGOGASTRODUODENOSCOPY N/A 01/21/2016   Procedure: ESOPHAGOGASTRODUODENOSCOPY (EGD);  Surgeon: Rogene Houston, MD;  [facility];  Service: Endoscopy;  Laterality: N/A;  11:15  . REFRACTIVE SURGERY  2011   Bilateral, two seperate occasions first in 2006  . RETINAL DETACHMENT SURGERY Bilateral 05/2005    FAMILY HISTORY: Family History  Problem Relation Age of Onset  . Hypertension Mother   . Diabetes Mother   . Kidney failure Mother   . Stroke Mother   . Kidney failure Father   . Diabetes Father   . Heart attack Father   . COPD Sister   . Diabetes Brother   . Diabetes Brother   . Diabetes Brother   . Diabetes Brother   . Diabetes Brother   . Hypertension Brother    Mother deceased 28 yo with a history of stroke Father 50 yo deceased in the OR during AV shunt procedure for hemodialysis There was a total of 8 siblings, all deceased. She has 2 boys, ages 29 and 57 yo.  57 yo is in [place] and he is  in the [employer].  57 yo is in [place].  She has 2 grandchildren a boy and girl.    No cancers in the family.  SOCIAL HISTORY:  reports that she has never smoked. She has never used smokeless tobacco. She reports that she does not drink alcohol or use drugs.  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    PERFORMANCE STATUS: The patient's performance status is 0 - Asymptomatic  Review of Systems  Constitutional: Negative.   HENT: Negative.  Eyes: Negative.   Respiratory: Negative.   Cardiovascular: Negative.   Gastrointestinal: Negative.   Genitourinary: Negative.   Musculoskeletal: Negative.   Skin: Negative.   Neurological: Negative.   Endo/Heme/Allergies: Negative.   Psychiatric/Behavioral: Negative.   All other systems reviewed and are negative. 14 point review of systems was performed and is negative except as detailed under history of present illness and above   PHYSICAL EXAM: Most Recent Vital Signs: Pulse 85, temperature 98.7 F (37.1 C), temperature source Oral, resp. rate 20, weight (!) 314 lb 11.2 oz (142.7 kg), SpO2 98 %. Physical Exam  Constitutional: She is oriented to person, place, and time and well-developed, well-nourished, and in no distress.  HENT:  Head: Normocephalic and atraumatic.  Mouth/Throat: Oropharynx is clear and moist.  Eyes: Conjunctivae and EOM are normal. Pupils are equal, round, and reactive to light. No scleral icterus.  Neck: Normal range of motion. Neck supple.  Cardiovascular: Normal rate, regular rhythm and normal heart sounds.   Pulmonary/Chest: Effort normal and breath sounds normal.  Abdominal: Soft. Bowel sounds are normal. She exhibits no distension and no mass. There is no tenderness. There is no rebound and no guarding.  Musculoskeletal: Normal range of motion.  Lymphadenopathy:    She has no cervical adenopathy.  Neurological: She is alert and oriented to person, place, and time. Gait normal.    Skin: Skin is warm and dry.  Psychiatric: Mood, memory, affect and judgment normal.  Nursing note and vitals reviewed.   LABORATORY DATA:  I have reviewed the data as listed. CBC    Component Value Date/Time   WBC 12.0 (H) 11/30/2016 1542   RBC 4.10 11/30/2016 1542   HGB 12.2 11/30/2016 1542   HCT 36.8 11/30/2016 1542   PLT 329 11/30/2016 1542   MCV 89.8 11/30/2016 1542   MCH 29.8 11/30/2016 1542   MCHC 33.2 11/30/2016 1542   RDW 13.6 11/30/2016 1542   LYMPHSABS 3.1 11/30/2016 1542   MONOABS 0.7 11/30/2016 1542   EOSABS 0.2 11/30/2016 1542   BASOSABS 0.1 11/30/2016 1542      Chemistry      Component Value Date/Time   NA 139 10/20/2016 0723   K 5.0 10/20/2016 0723   CL 103 10/20/2016 0723   CO2 29 10/20/2016 0723   BUN 17 10/20/2016 0723   CREATININE 1.12 (H) 10/20/2016 0723      Component Value Date/Time   CALCIUM 8.9 10/20/2016 0723   ALKPHOS 99 10/20/2016 0723   AST 12 10/20/2016 0723   ALT 11 10/20/2016 0723   BILITOT 0.4 10/20/2016 0723     Lab Results  Component Value Date   IRON 46 12/27/2015   TIBC 351 12/27/2015   FERRITIN 151 11/30/2016   Lab Results  Component Value Date   HGBA1C 7.2 (H) 10/20/2016   Results for Kendra Hanson, Kendra Hanson (MRN [id number])  Ref. Range 11/30/2016 15:42  Ferritin Latest Ref Range: 11 - 307 ng/mL 151  Folate Latest Ref Range: >5.9 ng/mL 12.2  CRP Latest Ref Range: <1.0 mg/dL 2.7 (H)  Vitamin B12 Latest Ref Range: 180 - 914 pg/mL 925 (H)    RADIOGRAPHY: I have personally reviewed the radiological images as listed and agreed with the findings in the report. Study Result   CLINICAL DATA:  Screening.  EXAM: 2D DIGITAL SCREENING BILATERAL MAMMOGRAM WITH CAD AND ADJUNCT TOMO  COMPARISON:  Previous exam(s).  ACR Breast Density Category b: There are scattered areas of fibroglandular density.  FINDINGS: There are no findings suspicious  for malignancy. Images were processed with CAD.  IMPRESSION: No mammographic  evidence of malignancy. A result letter of this screening mammogram will be mailed directly to the patient.  RECOMMENDATION: Screening mammogram in one year. (Code:[id number])  BI-RADS CATEGORY  1: Negative.   Electronically Signed   By: Ammie Ferrier M.D.   On: 11/20/2016 16:20     PATHOLOGY:  NONE  ASSESSMENT/PLAN:  Normocytic normochromic anemia Iron deficiency Intolerance to oral iron Low B12 Colonoscopy 04/05/2015 WNL Dr. Laural Golden EGD 01/21/2016 with GERD/gastric polyps  Minimal normocytic, normochromic anemia dating back to 09/2014. She has received IV iron with excellent tolerance.   Intolerant to ferrous sulfate with abdominal pain/bloating/constipation.  She continues on B12, B12 levels are excellent   H/H are excellent. WBC count is mildly elevated today but she notes she is recovered from a recent URI. She is to continue on B12.  Labs reviewed. Results are noted above.   Most recent mammogram on 11/20/2016. This showed no mammographic evidence of malignancy. She will continue with annual mammograms.  She has not received IV iron since May 2017.   She follows with her primary care physician every 3 months with blood work. She sees Dr. Moshe Cipro for primary care needs. The patient's diabetes medication was recently changed on 11/28/2016.  The patient is agreeable with being discharged and continuing blood work follow up with her primary care physician. She is welcome to make an appointment in our clinic as needed and call our clinic if she has any questions or concerns.  Will follow-up with [place] PRN. If she has a need for additional iron, Dr. Moshe Cipro can refer her back anytime.  All questions were answered. The patient knows to call the clinic with any problems, questions or concerns. We can certainly see the patient much sooner if necessary.  This document serves as a record of services personally performed by Ancil Linsey, MD. It was created on her behalf by  Arlyce Harman, a trained medical scribe. The creation of this record is based on the scribe's personal observations and the provider's statements to them. This document has been checked and approved by the attending provider.  I have reviewed the above documentation for accuracy and completeness and I agree with the above.  This note is electronically signed by:  Molli Hazard, MD  12/14/2016 7:55 PM

## 2016-12-01 NOTE — Telephone Encounter (Signed)
OV done and patient understands med changes

## 2016-12-01 NOTE — Patient Instructions (Signed)
[place] at [facility] Discharge Instructions  RECOMMENDATIONS MADE BY THE CONSULTANT AND ANY TEST RESULTS WILL BE SENT TO YOUR REFERRING PHYSICIAN.  You were seen today by Dr. Whitney Muse Follow up as scheduled  Thank you for choosing [place] at [facility] to provide your oncology and hematology care.  To afford each patient quality time with our provider, please arrive at least 15 minutes before your scheduled appointment time.    If you have a lab appointment with the [facility] please come in thru the  Main Entrance and check in at the main information desk  You need to re-schedule your appointment should you arrive 10 or more minutes late.  We strive to give you quality time with our providers, and arriving late affects you and other patients whose appointments are after yours.  Also, if you no show three or more times for appointments you may be dismissed from the clinic at the providers discretion.     Again, thank you for choosing [facility].  Our hope is that these requests will decrease the amount of time that you wait before being seen by our physicians.       _____________________________________________________________  Should you have questions after your visit to [facility], please contact our office at [phone] between the hours of 8:30 a.m. and 4:30 p.m.  Voicemails left after 4:30 p.m. will not be returned until the following business day.  For prescription refill requests, have your pharmacy contact our office.       Resources For Cancer Patients and their Caregivers ? American Cancer Society: Can assist with transportation, wigs, general needs, runs Look Good Feel Better.        519-676-0439 ? Cancer Care: Provides financial assistance, online support groups, medication/co-pay assistance.  1-800-813-HOPE 418-545-6791) ? [facility] Assists [place] cancer  patients and their families through emotional , educational and financial support.  631 314 7675 ? [place] DSS Where to apply for food stamps, Medicaid and utility assistance. (873)711-9125 ? [facility]: Transportation to medical appointments. 571-180-2373 ? Social Security Administration: May apply for disability if have a Stage IV cancer. (915)695-4397 540-696-5225 ? [employer], Disability and Transit Services: Assists with nutrition, care and transit needs. [place] Support Programs: @10RELATIVEDAYS @ > Cancer Support Group  2nd Tuesday of the month 1pm-2pm, Journey Room  > Creative Journey  3rd Tuesday of the month 1130am-1pm, Journey Room  > Look Good Feel Better  1st Wednesday of the month 10am-12 noon, Journey Room (Call [place] to register [phone])

## 2016-12-14 ENCOUNTER — Encounter (HOSPITAL_COMMUNITY): Payer: Self-pay | Admitting: Hematology & Oncology

## 2016-12-14 MED FILL — ESTRADIOL PATCH 0.0375: 0.0375 | 28 days supply | Qty: 8 | Fill #4

## 2016-12-22 DIAGNOSIS — Z3046 Encounter for surveillance of implantable subdermal contraceptive: Secondary | ICD-10-CM | POA: Diagnosis not present

## 2016-12-22 DIAGNOSIS — N952 Postmenopausal atrophic vaginitis: Secondary | ICD-10-CM | POA: Diagnosis not present

## 2016-12-22 DIAGNOSIS — N951 Menopausal and female climacteric states: Secondary | ICD-10-CM | POA: Diagnosis not present

## 2016-12-22 MED FILL — PROGESTERONE 100 MG CAPSULE: 100 | 30 days supply | Qty: 30 | Fill #0

## 2016-12-27 MED FILL — PRAVASTATIN NA 40 MG TAB: 40 | 90 days supply | Qty: 90 | Fill #1

## 2016-12-27 MED FILL — DILTIAZEM HCL ER 360 MG CAP: 360 | 90 days supply | Qty: 90 | Fill #2

## 2017-01-05 ENCOUNTER — Other Ambulatory Visit: Payer: Self-pay | Admitting: Family Medicine

## 2017-01-05 ENCOUNTER — Telehealth: Payer: Self-pay

## 2017-01-05 MED ORDER — PREDNISONE 5 MG PO TABS: 5.0000 mg | ORAL_TABLET | Freq: Two times a day (BID) | ORAL | 0 refills | Status: AC

## 2017-01-05 NOTE — Telephone Encounter (Signed)
Prednisone x 5 days prescribed

## 2017-01-05 NOTE — Telephone Encounter (Signed)
Called and left message for patient notifying that medication has been sent to pharmacy.

## 2017-01-07 ENCOUNTER — Emergency Department (HOSPITAL_COMMUNITY)
Admission: EM | Admit: 2017-01-07 | Discharge: 2017-01-07 | Disposition: A | Discharge status: Home / Self Care | Payer: 59 | Attending: Emergency Medicine | Admitting: Emergency Medicine

## 2017-01-07 ENCOUNTER — Encounter (HOSPITAL_COMMUNITY): Payer: Self-pay | Admitting: Emergency Medicine

## 2017-01-07 ENCOUNTER — Other Ambulatory Visit: Payer: Self-pay

## 2017-01-07 DIAGNOSIS — G5602 Carpal tunnel syndrome, left upper limb: Secondary | ICD-10-CM | POA: Insufficient documentation

## 2017-01-07 DIAGNOSIS — Z794 Long term (current) use of insulin: Secondary | ICD-10-CM | POA: Diagnosis not present

## 2017-01-07 DIAGNOSIS — M7918 Myalgia, other site: Secondary | ICD-10-CM

## 2017-01-07 DIAGNOSIS — M542 Cervicalgia: Secondary | ICD-10-CM | POA: Diagnosis not present

## 2017-01-07 DIAGNOSIS — I10 Essential (primary) hypertension: Secondary | ICD-10-CM | POA: Insufficient documentation

## 2017-01-07 DIAGNOSIS — E119 Type 2 diabetes mellitus without complications: Secondary | ICD-10-CM | POA: Diagnosis not present

## 2017-01-07 DIAGNOSIS — M791 Myalgia: Secondary | ICD-10-CM | POA: Diagnosis not present

## 2017-01-07 DIAGNOSIS — M25532 Pain in left wrist: Secondary | ICD-10-CM | POA: Diagnosis present

## 2017-01-07 MED ORDER — DEXAMETHASONE SODIUM PHOSPHATE 4 MG/ML IJ SOLN
8.0000 mg | Freq: Once | INTRAMUSCULAR | Status: AC
  Administered 2017-01-07: 8 mg via INTRAMUSCULAR
  Filled 2017-01-07: qty 2

## 2017-01-07 NOTE — Discharge Instructions (Signed)
Apply ice packs on/off to your neck.  Wear the wrist splint day and night for 4-5 days.  Call your orthopedic's office to arrange a follow-up appt.  Monitor your blood sugar closely for the next several days as steroids can elevate your blood sugar.

## 2017-01-07 NOTE — ED Provider Notes (Signed)
[facility] DEPT Provider Note   CSN: [id number] Arrival date & time: 01/07/17  1707  By signing my name below, I, Collene Leyden, attest that this documentation has been prepared under the direction and in the presence of Davie Sagona PA-C. Electronically Signed: Collene Leyden, Scribe. 01/07/17. 5:56 PM.  History   Chief Complaint Chief Complaint  Patient presents with  . Wrist Pain    HPI Comments: Kendra Hanson is a 56 y.o. female with a hx of DM, GERD, HTN, and HLD, who presents to the Emergency Department complaining of recurrent, worsening constant left wrist pain that began 3 weeks ago. Patient reports a history of carpel tunnel syndrome and degenerative disc disease in her neck. Patient also describes pain to the left arm, neck, and into her shoulder blade and intermittent numbness in the ring finger. Patient had an x-ray previously and was treated for arthritis. Patient reports calling her primary care physician 3 days ago, in which she was prescribed flexeril, prednisone 5 mg, and tylenol for four days with no relief. Patient describes her pain as sharp with a severity of 8/10. Patient has had a conduction nerve study and evaluated by hand surgeon. Patient was supposed to have surgery, but could not due to work.  Patient denies any fever, swelling, discoloration, chest pain, shortness of breath, or previous cardiac history. Patient requesting a steroid shot.   The history is provided by the patient. No language interpreter was used.    Past Medical History:  Diagnosis Date  . Anemia   . Fibroids    Uterine  . GERD (gastroesophageal reflux disease)   . Hyperlipidemia 2008   Lipid profile in 04/2011:136, 53, 43  . Hypertension 2008   Normal CBC and CMet in 2012; negative stress nuclear in 2006- patient asymptomatic  . Insulin dependent diabetes mellitus ([place]) 1996  . Multiple allergies    perennial  . Normocytic normochromic anemia 12/27/2015  . Obesity   . PONV  (postoperative nausea and vomiting)   . Sleep apnea    CPAP    Patient Active Problem List   Diagnosis Date Noted  . Encounter for medication management 11/30/2016  . Osteoarthritis of both knees 12/27/2015  . Normocytic normochromic anemia 12/27/2015  . Carpal tunnel syndrome 07/12/2015  . Bilateral leg numbness 07/12/2015  . Pigmented skin lesion 05/06/2015  . Iron deficiency 03/14/2015  . Seasonal allergies 02/01/2015  . Piles (hemorrhoids) 09/25/2013  . Sleep apnea 06/13/2013  . Vitamin D deficiency 06/05/2012  . GERD (gastroesophageal reflux disease) 06/03/2012  . Leiomyoma of uterus 10/30/2008  . Type II diabetes mellitus with renal manifestations ([place]) 02/25/2008  . Hyperlipidemia LDL goal <100 02/25/2008  . Morbid obesity ([place]) 02/25/2008  . Essential hypertension 02/25/2008    Past Surgical History:  Procedure Laterality Date  . COLONOSCOPY N/A 04/05/2015   Procedure: COLONOSCOPY;  Surgeon: Rogene Houston, MD;  Location: [facility];  Service: Endoscopy;  Laterality: N/A;  730  . DENTAL SURGERY    . ESOPHAGOGASTRODUODENOSCOPY N/A 01/21/2016   Procedure: ESOPHAGOGASTRODUODENOSCOPY (EGD);  Surgeon: Rogene Houston, MD;  Location: [facility];  Service: Endoscopy;  Laterality: N/A;  11:15  . REFRACTIVE SURGERY  2011   Bilateral, two seperate occasions first in 2006  . REMOVAL OF IMPLANT    . RETINAL DETACHMENT SURGERY Bilateral 05/2005    OB History    No data available       Home Medications    Prior to Admission medications   Medication  Sig Start Date End Date Taking? Authorizing Provider  azelastine (ASTELIN) 0.1 % nasal spray Place 2 sprays into both nostrils 2 (two) times daily. Use in each nostril as directed 07/05/16   Fayrene Helper, MD  Blood Glucose Monitoring Suppl (FREESTYLE LITE) DEVI  11/22/16   Historical Provider, MD  cyclobenzaprine (FLEXERIL) 10 MG tablet One tablet at bedtime , as needed, for neck spasm 10/26/16   Fayrene Helper, MD    diltiazem (TIAZAC) 360 MG 24 hr capsule TAKE 1 CAPSULE BY MOUTH DAILY. 06/30/16   Fayrene Helper, MD  estradiol (VIVELLE-DOT) 0.0375 MG/24HR Place 1 patch onto the skin 3 (three) times a week. Monday, Wednesday, and Saturday. 07/26/15   Historical Provider, MD  fluticasone (FLONASE) 50 MCG/ACT nasal spray Place 2 sprays into both nostrils daily. Patient not taking: Reported on 10/30/2016 02/01/15   Fayrene Helper, MD  furosemide (LASIX) 20 MG tablet TAKE 1 TABLET (20 MG TOTAL) BY MOUTH 3 (THREE) TIMES DAILY. 06/30/16   Fayrene Helper, MD  gabapentin (NEURONTIN) 100 MG capsule Take 200 mg by mouth daily.    Historical Provider, MD  glucose blood (FREESTYLE TEST STRIPS) test strip Three times daily testing dx e11.65 11/14/16   Fayrene Helper, MD  insulin glargine (LANTUS) 100 unit/mL SOPN Inject 0.6 mLs (60 Units total) into the skin at bedtime. 11/27/16   Fayrene Helper, MD  Insulin Lispro Prot & Lispro (HUMALOG MIX 75/25 KWIKPEN) (75-25) 100 UNIT/ML Kwikpen Inject 12 units in the morning, 10 units at lunchtime, and 12 units at bedtime 11/24/16   Fayrene Helper, MD  Lancets (FREESTYLE) lancets Use as instructed three times daily dx e11.65 11/14/16   Fayrene Helper, MD  liraglutide 18 MG/3ML SOPN Inject 0.3 mLs (1.8 mg total) into the skin daily. 11/14/16   Fayrene Helper, MD  loratadine (CLARITIN) 10 MG tablet Take 10 mg by mouth daily as needed for allergies.     Historical Provider, MD  losartan (COZAAR) 100 MG tablet TAKE 1 TABLET BY MOUTH DAILY. 08/08/16   Fayrene Helper, MD  metFORMIN (GLUCOPHAGE) 1000 MG tablet TAKE 1 TABLET BY MOUTH 2 TIMES DAILY WITH A MEAL 05/17/16   Fayrene Helper, MD  montelukast (SINGULAIR) 10 MG tablet Take 1 tablet (10 mg total) by mouth at bedtime. 07/05/16   Fayrene Helper, MD  naproxen sodium (ANAPROX) 220 MG tablet Take 220 mg by mouth daily as needed (pain).    Historical Provider, MD  NOVOLOG FLEXPEN 100 UNIT/ML FlexPen  09/19/16    Historical Provider, MD  ondansetron (ZOFRAN) 4 MG tablet Take 1 tablet (4 mg total) by mouth 2 (two) times daily as needed for nausea or vomiting. 01/21/16   Rogene Houston, MD  pantoprazole (PROTONIX) 20 MG tablet TAKE 1 TABLET BY MOUTH EVERY DAY 11/18/15   Fayrene Helper, MD  potassium chloride (K-DUR,KLOR-CON) 10 MEQ tablet Take 1 tablet (10 mEq total) by mouth 3 (three) times daily. 07/27/16   Fayrene Helper, MD  pravastatin (PRAVACHOL) 40 MG tablet Take 1 tablet (40 mg total) by mouth daily. 09/29/16   Fayrene Helper, MD  predniSONE (DELTASONE) 5 MG tablet Take 1 tablet (5 mg total) by mouth 2 (two) times daily. 01/05/17 01/09/17  Fayrene Helper, MD  PREMARIN vaginal cream  11/27/16   Historical Provider, MD  terconazole (TERAZOL 7) 0.4 % vaginal cream  10/25/16   Historical Provider, MD  UNIFINE PENTIPS 31G X 8 MM  MISC USE AS DIRECTED 02/16/16   Fayrene Helper, MD  vitamin B-12 (CYANOCOBALAMIN) 100 MCG tablet Take 100 mcg by mouth daily.    Historical Provider, MD  Vitamin D, Ergocalciferol, (DRISDOL) 50000 units CAPS capsule TAKE 1 CAPSULE (50,000 UNITS TOTAL) BY MOUTH ONCE A WEEK. 01/27/16   Fayrene Helper, MD    Family History Family History  Problem Relation Age of Onset  . Hypertension Mother   . Diabetes Mother   . Kidney failure Mother   . Stroke Mother   . Kidney failure Father   . Diabetes Father   . Heart attack Father   . COPD Sister   . Diabetes Brother   . Diabetes Brother   . Diabetes Brother   . Diabetes Brother   . Diabetes Brother   . Hypertension Brother     Social History Social History  Substance Use Topics  . Smoking status: Never Smoker  . Smokeless tobacco: Never Used  . Alcohol use No     Allergies   Patient has no known allergies.   Review of Systems Review of Systems  Constitutional: Negative for chills and fever.  Respiratory: Negative for chest tightness and shortness of breath.   Cardiovascular: Negative for chest  pain.  Gastrointestinal: Negative for nausea and vomiting.  Musculoskeletal: Positive for arthralgias (wrist, arm, neck, shoulder blade) and neck pain.  Skin: Negative for color change and rash.  Neurological: Positive for numbness (ring finger). Negative for dizziness and weakness.     Physical Exam Updated Vital Signs BP 146/99 (BP Location: Right Arm)   Pulse 86   Temp 98.6 F (37 C) (Oral)   Resp 18   Ht 5\' 5"  (1.651 m)   Wt 300 lb (136.1 kg)   SpO2 100%   BMI 49.92 kg/m   Physical Exam  Constitutional: She is oriented to person, place, and time. She appears well-developed and well-nourished. No distress.  HENT:  Head: Normocephalic and atraumatic.  Neck: Neck supple.  Cardiovascular: Normal rate, regular rhythm and intact distal pulses.   Pulmonary/Chest: Effort normal and breath sounds normal.  Abdominal: Soft. There is no tenderness.  Musculoskeletal: Normal range of motion.       Left shoulder: She exhibits tenderness. She exhibits no swelling, no effusion, normal pulse and normal strength.       Arms: Tenderness to palpation of the left scapula border and left SCM muscle.  Positive tinel's sign on left. No motor or sensory deficits.   Neurological: She is alert and oriented to person, place, and time. She has normal strength. No sensory deficit. Gait normal.  Skin: Skin is warm and dry. No rash noted.  Psychiatric: She has a normal mood and affect.  Nursing note and vitals reviewed.    ED Treatments / Results  DIAGNOSTIC STUDIES: Oxygen Saturation is 100% on RA, normal by my interpretation.    COORDINATION OF CARE: 5:55 PM Discussed treatment plan with pt at bedside and pt agreed to plan, which includes a steroid shot and an arm sling.   Labs (all labs ordered are listed, but only abnormal results are displayed) Labs Reviewed - No data to display  EKG  EKG Interpretation None       Radiology No results found.  Procedures Procedures (including  critical care time)  Medications Ordered in ED Medications - No data to display   Initial Impression / Assessment and Plan / ED Course  I have reviewed the triage vital signs and the nursing  notes.  Pertinent labs & imaging results that were available during my care of the patient were reviewed by me and considered in my medical decision making (see chart for details).     Pt with recurrent carpal tunnel of left wrist and likely muscular strain of left neck and upper back.  NV intact.  No concerning sx's for emergent neurological process, or septic joint.    Pt given IM decadron here, advised to d/c oral prednisone. Cock up applied.  Pt agrees to f/u with PCP or her hand surgeon.  Appears stable for d/c.    Final Clinical Impressions(s) / ED Diagnoses   Final diagnoses:  Carpal tunnel syndrome of left wrist  Muscle pain, myofascial    New Prescriptions New Prescriptions   No medications on file   I personally performed the services described in this documentation, which was scribed in my presence. The recorded information has been reviewed and is accurate.     Bufford Lope 01/08/17 2203    Merrily Pew, MD 01/08/17 [phone]

## 2017-01-07 NOTE — ED Notes (Signed)
Pt is an employee here- states she has had L shoulder pain since last Weds, Saw Dr Moshe Cipro, here PCP and given a rx for prednisone. She reports she has continued pain with tingling to her wrist and 3,4,5th fingers

## 2017-01-07 NOTE — ED Triage Notes (Addendum)
Patient c/o left wrist pain that radiates up arm and into shoulder and neck. Patient states has carpal tunnel and degenerative disc disease. Patient called PCP Friday and had prescriptions called in. Patient taking flexeril, prednisone, and ibuprofen with no relief. Denies any chest pain or shortness of breath. Radial pulse strong.

## 2017-01-11 ENCOUNTER — Telehealth: Payer: Self-pay | Admitting: Family Medicine

## 2017-01-11 ENCOUNTER — Other Ambulatory Visit: Payer: Self-pay | Admitting: Orthopedic Surgery

## 2017-01-11 DIAGNOSIS — M79642 Pain in left hand: Secondary | ICD-10-CM | POA: Diagnosis not present

## 2017-01-11 DIAGNOSIS — M542 Cervicalgia: Secondary | ICD-10-CM

## 2017-01-11 MED FILL — VICTOZA 18 MG/3 ML INJECT P: 18 | 30 days supply | Qty: 9 | Fill #1

## 2017-01-11 MED FILL — OXYCODONE W/APAP 5/325 TAB: 5-325 | 4 days supply | Qty: 40 | Fill #0

## 2017-01-11 MED FILL — ESTRADIOL PATCH 0.0375: 0.0375 | 28 days supply | Qty: 8 | Fill #5

## 2017-01-11 NOTE — Telephone Encounter (Signed)
pls refer to dr Amedeo Plenty re carpal tunnnel or surgeon of her choice.I will sign,may have been seeing someone already pls spk with her May arrange nurse bP check  At most convenient time

## 2017-01-11 NOTE — Telephone Encounter (Addendum)
Kendra Hanson is now asking for a referral to a specialist for her carpal tunnel, please advise?  ADDENDUM:  Kendra Hanson called the on call phone at just before 6 this morning asking to speak to Dr Lynnea Maizes about her blood pressure and her CTS.  She was advised that she needed to call the office at 8.  I verified that there was NO emergency requiring attention at that hour.  YSN

## 2017-01-12 ENCOUNTER — Ambulatory Visit
Admission: RE | Admit: 2017-01-12 | Discharge: 2017-01-12 | Disposition: A | Discharge status: Home / Self Care | Payer: 59 | Source: Ambulatory Visit | Attending: Orthopedic Surgery | Admitting: Orthopedic Surgery

## 2017-01-12 DIAGNOSIS — M50222 Other cervical disc displacement at C5-C6 level: Secondary | ICD-10-CM | POA: Diagnosis not present

## 2017-01-12 DIAGNOSIS — M50221 Other cervical disc displacement at C4-C5 level: Secondary | ICD-10-CM | POA: Diagnosis not present

## 2017-01-12 DIAGNOSIS — M542 Cervicalgia: Secondary | ICD-10-CM

## 2017-01-12 NOTE — Telephone Encounter (Signed)
Patient was already seen and had MRI done this am. Will come in Monday for BP check

## 2017-01-15 ENCOUNTER — Ambulatory Visit: Payer: 59

## 2017-01-15 ENCOUNTER — Telehealth: Payer: Self-pay

## 2017-01-15 VITALS — BP 144/72 | HR 80

## 2017-01-15 DIAGNOSIS — I10 Essential (primary) hypertension: Secondary | ICD-10-CM

## 2017-01-15 NOTE — Telephone Encounter (Signed)
Pt in office for b/p ck, asking for her MRI results.

## 2017-01-17 DIAGNOSIS — G5602 Carpal tunnel syndrome, left upper limb: Secondary | ICD-10-CM | POA: Diagnosis not present

## 2017-01-17 DIAGNOSIS — Z6841 Body Mass Index (BMI) 40.0 and over, adult: Secondary | ICD-10-CM | POA: Diagnosis not present

## 2017-01-17 DIAGNOSIS — M4722 Other spondylosis with radiculopathy, cervical region: Secondary | ICD-10-CM | POA: Diagnosis not present

## 2017-01-17 DIAGNOSIS — G5622 Lesion of ulnar nerve, left upper limb: Secondary | ICD-10-CM | POA: Diagnosis not present

## 2017-01-17 DIAGNOSIS — I10 Essential (primary) hypertension: Secondary | ICD-10-CM | POA: Diagnosis not present

## 2017-01-17 DIAGNOSIS — M542 Cervicalgia: Secondary | ICD-10-CM | POA: Diagnosis not present

## 2017-01-17 MED FILL — DICLOFENAC SOD 75 MG TAB EC: 75 | 30 days supply | Qty: 60 | Fill #0 | Status: TO

## 2017-01-17 MED FILL — GABAPENTIN 300 MG CAPSULE: 300 | 30 days supply | Qty: 90 | Fill #0 | Status: TO

## 2017-01-19 MED FILL — PROGESTERONE 100 MG CAPSULE: 100 | 30 days supply | Qty: 30 | Fill #1

## 2017-01-20 DIAGNOSIS — E785 Hyperlipidemia, unspecified: Secondary | ICD-10-CM | POA: Diagnosis not present

## 2017-01-20 DIAGNOSIS — I10 Essential (primary) hypertension: Secondary | ICD-10-CM | POA: Diagnosis not present

## 2017-01-20 DIAGNOSIS — E1122 Type 2 diabetes mellitus with diabetic chronic kidney disease: Secondary | ICD-10-CM | POA: Diagnosis not present

## 2017-01-21 LAB — COMPLETE METABOLIC PANEL WITH GFR
ALT: 12 U/L (ref 6–29)
AST: 9 U/L — ABNORMAL LOW (ref 10–35)
Albumin: 3.9 g/dL (ref 3.6–5.1)
Alkaline Phosphatase: 105 U/L (ref 33–130)
BUN: 21 mg/dL (ref 7–25)
CO2: 29 mmol/L (ref 20–31)
Calcium: 9.4 mg/dL (ref 8.6–10.4)
Chloride: 102 mmol/L (ref 98–110)
Creat: 1.19 mg/dL — ABNORMAL HIGH (ref 0.50–1.05)
GFR, Est African American: 59 mL/min — ABNORMAL LOW (ref >=60)
GFR, Est Non African American: 51 mL/min — ABNORMAL LOW (ref >=60)
Glucose, Bld: 55 mg/dL — ABNORMAL LOW (ref 65–99)
Potassium: 4.6 mmol/L (ref 3.5–5.3)
Sodium: 141 mmol/L (ref 135–146)
Total Bilirubin: 0.4 mg/dL (ref 0.2–1.2)
Total Protein: 7 g/dL (ref 6.1–8.1)

## 2017-01-21 LAB — LIPID PANEL
Cholesterol: 192 mg/dL (ref <200)
HDL: 68 mg/dL (ref >50)
LDL Cholesterol: 111 mg/dL — ABNORMAL HIGH (ref <100)
Total CHOL/HDL Ratio: 2.8 Ratio (ref <5.0)
Triglycerides: 65 mg/dL (ref <150)
VLDL: 13 mg/dL (ref <30)

## 2017-01-22 LAB — HEMOGLOBIN A1C
Hgb A1c MFr Bld: 6.7 % — ABNORMAL HIGH (ref <5.7)
Mean Plasma Glucose: 146 mg/dL

## 2017-01-23 ENCOUNTER — Encounter: Payer: Self-pay | Admitting: Family Medicine

## 2017-01-25 ENCOUNTER — Ambulatory Visit (INDEPENDENT_AMBULATORY_CARE_PROVIDER_SITE_OTHER): Payer: 59 | Admitting: Family Medicine

## 2017-01-25 ENCOUNTER — Encounter: Payer: Self-pay | Admitting: Family Medicine

## 2017-01-25 VITALS — BP 120/70 | HR 95 | Resp 15 | Ht 65.0 in | Wt 310.0 lb

## 2017-01-25 DIAGNOSIS — I10 Essential (primary) hypertension: Secondary | ICD-10-CM | POA: Diagnosis not present

## 2017-01-25 DIAGNOSIS — E785 Hyperlipidemia, unspecified: Secondary | ICD-10-CM | POA: Diagnosis not present

## 2017-01-25 DIAGNOSIS — M79602 Pain in left arm: Secondary | ICD-10-CM

## 2017-01-25 DIAGNOSIS — E1121 Type 2 diabetes mellitus with diabetic nephropathy: Secondary | ICD-10-CM | POA: Diagnosis not present

## 2017-01-25 DIAGNOSIS — Z794 Long term (current) use of insulin: Secondary | ICD-10-CM | POA: Diagnosis not present

## 2017-01-25 NOTE — Patient Instructions (Addendum)
f/u in  3 month, call if you need me before  HBA1C, fasting lipid, cmp and EGFR in 3 month  CONGRATS  Try gabapentin 100 mg one in the morning and do the 300 mg capsule at bedtime  All the best Thank you  for choosing [facility]. We consider it a privelige to serve you.  Delivering excellent health care in a caring and  compassionate way is our goal.  Partnering with you,  so that together we can achieve this goal is our strategy.

## 2017-01-26 DIAGNOSIS — M79602 Pain in left arm: Secondary | ICD-10-CM | POA: Insufficient documentation

## 2017-01-26 NOTE — Assessment & Plan Note (Signed)
Controlled, no change in medication DASH diet and commitment to daily physical activity for a minimum of 30 minutes discussed and encouraged, as a part of hypertension management. The importance of attaining a healthy weight is also discussed.  BP/Weight 01/25/2017 01/15/2017 01/07/2017 12/01/2016 11/28/2016 10/26/2016 67/59/1638  Systolic BP 466 599 357 - 017 793 903  Diastolic BP 70 72 99 - 74 80 95  Wt. (Lbs) 310 - 300 314.7 314 314 -  BMI 51.59 - 49.92 55.75 55.62 55.62 -

## 2017-01-26 NOTE — Assessment & Plan Note (Signed)
Hyperlipidemia:Low fat diet discussed and encouraged.   Lipid Panel  Lab Results  Component Value Date   CHOL 192 01/20/2017   HDL 68 01/20/2017   LDLCALC 111 (H) 01/20/2017   TRIG 65 01/20/2017   CHOLHDL 2.8 01/20/2017   Not at goal, needs to reduce fried and fatty foods

## 2017-01-26 NOTE — Assessment & Plan Note (Signed)
Marked improvement which is excellent! [name] is reminded of the importance of commitment to daily physical activity for 30 minutes or more, as able and the need to limit carbohydrate intake to 30 to 60 grams per meal to help with blood sugar control.   The need to take medication as prescribed, test blood sugar as directed, and to call between visits if there is a concern that blood sugar is uncontrolled is also discussed.   Kendra Hanson is reminded of the importance of daily foot exam, annual eye examination, and good blood sugar, blood pressure and cholesterol control.  Diabetic Labs Latest Ref Rng & Units 01/20/2017 10/20/2016 07/01/2016 03/10/2016 11/29/2015  HbA1c <5.7 % 6.7(H) 7.2(H) 7.0(H) 7.7(H) 7.6(H)  Microalbumin Not Estab. ug/mL - - - 110.4(H) -  Micro/Creat Ratio 0.0 - 30.0 mg/g creat - - - 82.1(H) -  Chol <200 mg/dL 192 165 - 140 -  HDL >50 mg/dL 68 42(L) - 46 -  Calc LDL <100 mg/dL 111(H) 110(H) - 85 -  Triglycerides <150 mg/dL 65 66 - 47 -  Creatinine 0.50 - 1.05 mg/dL 1.19(H) 1.12(H) 1.00 1.04(H) 1.03   BP/Weight 01/25/2017 01/15/2017 01/07/2017 12/01/2016 11/28/2016 10/26/2016 40/35/2481  Systolic BP 859 093 112 - 162 446 950  Diastolic BP 70 72 99 - 74 80 95  Wt. (Lbs) 310 - 300 314.7 314 314 -  BMI 51.59 - 49.92 55.75 55.62 55.62 -   Foot/eye exam completion dates 07/05/2016 07/12/2015  Foot Form Completion Done Done

## 2017-01-26 NOTE — Progress Notes (Signed)
Kendra Hanson     MRN: [id number]      DOB: [birth year]   HPI Kendra Hanson is here for follow up and re-evaluation of chronic medical conditions, medication management and review of any available recent lab and radiology data.  Preventive health is updated, specifically  Cancer screening and Immunization.   Questions or concerns regarding consultations or procedures which the PT has had in the interim are  addressed.Currently out of work as she is having left hand and upper extremity weakness evaluated The PT denies any adverse reactions to current medications since the last visit.  Denies polyuria, polydipsia, blurred vision , or hypoglycemic episodes.  ROS Denies recent fever or chills. Denies sinus pressure, nasal congestion, ear pain or sore throat. Denies chest congestion, productive cough or wheezing. Denies chest pains, palpitations and leg swelling Denies abdominal pain, nausea, vomiting,diarrhea or constipation.   Denies dysuria, frequency, hesitancy or incontinence.  Denies headaches, seizures, numbness, or tingling. Denies depression, anxiety or insomnia. Denies skin break down or rash.   PE  BP 120/70   Pulse 95   Resp 15   Ht 5\' 5"  (1.651 m)   Wt (!) 310 lb (140.6 kg)   SpO2 99%   BMI 51.59 kg/m   Patient alert and oriented and in no cardiopulmonary distress.  HEENT: No facial asymmetry, EOMI,   oropharynx pink and moist.  Neck decreased ROM no JVD, no mass.  Chest: Clear to auscultation bilaterally.  CVS: S1, S2 no murmurs, no S3.Regular rate.  ABD: Soft non tender.   Ext: No edema  MS: Adequate though reduced  ROM spine, shoulders, hips and knees.  Skin: Intact, no ulcerations or rash noted.  Psych: Good eye contact, normal affect. Memory intact not anxious or depressed appearing.  CNS: CN 2-12 intact, power,  normal throughout.no focal deficits noted.   Assessment & Plan  Essential hypertension Controlled, no change in medication DASH diet  and commitment to daily physical activity for a minimum of 30 minutes discussed and encouraged, as a part of hypertension management. The importance of attaining a healthy weight is also discussed.  BP/Weight 01/25/2017 01/15/2017 01/07/2017 12/01/2016 11/28/2016 10/26/2016 49/70/2637  Systolic BP 858 850 277 - 412 878 676  Diastolic BP 70 72 99 - 74 80 95  Wt. (Lbs) 310 - 300 314.7 314 314 -  BMI 51.59 - 49.92 55.75 55.62 55.62 -       Type II diabetes mellitus with renal manifestations Marked improvement which is excellent! Kendra Hanson is reminded of the importance of commitment to daily physical activity for 30 minutes or more, as able and the need to limit carbohydrate intake to 30 to 60 grams per meal to help with blood sugar control.   The need to take medication as prescribed, test blood sugar as directed, and to call between visits if there is a concern that blood sugar is uncontrolled is also discussed.   Kendra Hanson is reminded of the importance of daily foot exam, annual eye examination, and good blood sugar, blood pressure and cholesterol control.  Diabetic Labs Latest Ref Rng & Units 01/20/2017 10/20/2016 07/01/2016 03/10/2016 11/29/2015  HbA1c <5.7 % 6.7(H) 7.2(H) 7.0(H) 7.7(H) 7.6(H)  Microalbumin Not Estab. ug/mL - - - 110.4(H) -  Micro/Creat Ratio 0.0 - 30.0 mg/g creat - - - 82.1(H) -  Chol <200 mg/dL 192 165 - 140 -  HDL >50 mg/dL 68 42(L) - 46 -  Calc LDL <100 mg/dL 111(H) 110(H) - 85 -  Triglycerides <150 mg/dL 65 66 - 47 -  Creatinine 0.50 - 1.05 mg/dL 1.19(H) 1.12(H) 1.00 1.04(H) 1.03   BP/Weight 01/25/2017 01/15/2017 01/07/2017 12/01/2016 11/28/2016 10/26/2016 77/82/4235  Systolic BP 361 443 154 - 008 676 195  Diastolic BP 70 72 99 - 74 80 95  Wt. (Lbs) 310 - 300 314.7 314 314 -  BMI 51.59 - 49.92 55.75 55.62 55.62 -   Foot/eye exam completion dates 07/05/2016 07/12/2015  Foot Form Completion Done Done        Morbid obesity Improved on our scale Patient  re-educated about  the importance of commitment to a  minimum of 150 minutes of exercise per week.  The importance of healthy food choices with portion control discussed. Encouraged to start a food diary, count calories and to consider  joining a support group. Sample diet sheets offered. Goals set by the patient for the next several months.   Weight /BMI 01/25/2017 01/07/2017 12/01/2016  WEIGHT 310 lb 300 lb 314 lb 11.2 oz  HEIGHT 5\' 5"  5\' 5"  -  BMI 51.59 kg/m2 49.92 kg/m2 55.75 kg/m2      Hyperlipidemia LDL goal <100 Hyperlipidemia:Low fat diet discussed and encouraged.   Lipid Panel  Lab Results  Component Value Date   CHOL 192 01/20/2017   HDL 68 01/20/2017   LDLCALC 111 (H) 01/20/2017   TRIG 65 01/20/2017   CHOLHDL 2.8 01/20/2017   Not at goal, needs to reduce fried and fatty foods    Upper extremity pain, diffuse, left Currently undergoing ortho eval, has upcoming nerve conduction test and is out of work. May have C spine problem, advised her to start daytime gabapentin 100 mg , continue bedtime 300 mg dose. Reports intolerance to diclofenac recently prescribed by ortho

## 2017-01-26 NOTE — Assessment & Plan Note (Addendum)
Improved on our scale Patient re-educated about  the importance of commitment to a  minimum of 150 minutes of exercise per week.  The importance of healthy food choices with portion control discussed. Encouraged to start a food diary, count calories and to consider  joining a support group. Sample diet sheets offered. Goals set by the patient for the next several months.   Weight /BMI 01/25/2017 01/07/2017 12/01/2016  WEIGHT 310 lb 300 lb 314 lb 11.2 oz  HEIGHT 5\' 5"  5\' 5"  -  BMI 51.59 kg/m2 49.92 kg/m2 55.75 kg/m2

## 2017-01-26 NOTE — Assessment & Plan Note (Addendum)
Currently undergoing ortho eval, has upcoming nerve conduction test and is out of work. May have C spine problem, advised her to start daytime gabapentin 100 mg , continue bedtime 300 mg dose. Reports intolerance to diclofenac recently prescribed by ortho

## 2017-02-02 MED FILL — LANTUS SOLOSTAR 100 UNITS/M: 100 | 75 days supply | Qty: 45 | Fill #1

## 2017-02-02 MED FILL — ESTRADIOL PATCH 0.0375: 0.0375 | 28 days supply | Qty: 8 | Fill #6

## 2017-02-05 MED FILL — VICTOZA 18 MG/3 ML INJECT P: 18 | 30 days supply | Qty: 9 | Fill #2

## 2017-02-12 ENCOUNTER — Other Ambulatory Visit: Payer: Self-pay | Admitting: *Deleted

## 2017-02-12 NOTE — Patient Outreach (Signed)
Kendra's Hanson blood sugar data shows frequent hypoglycemia in the last week. This RNCM encouraged her to contact Dr. Moshe Cipro so call placed to Kendra this afternoon advising her this RNCM will send blood sugar readings to Dr. Moshe Cipro via [place] as her blood sugar this morning was very low.  Her blood sugar readings for the last week are as follows: Fasting: [phone] Pre lunch: [phone] Pre dinner: 71,119 These readings were sent to Dr. Moshe Cipro via Epic In [place] message with an additional request per Kendra for refills for test strips and drum lancets for the Accu-Chek Guide glucometer and FastClix drum lancets.   Barrington Ellison RN,CCM,CDE [place] Management Coordinator [facility] To [facility] Office Phone [phone] Office Fax [phone]

## 2017-02-13 ENCOUNTER — Other Ambulatory Visit: Payer: Self-pay | Admitting: *Deleted

## 2017-02-13 ENCOUNTER — Telehealth: Payer: Self-pay

## 2017-02-13 DIAGNOSIS — I10 Essential (primary) hypertension: Secondary | ICD-10-CM | POA: Diagnosis not present

## 2017-02-13 DIAGNOSIS — G5602 Carpal tunnel syndrome, left upper limb: Secondary | ICD-10-CM | POA: Diagnosis not present

## 2017-02-13 DIAGNOSIS — G5622 Lesion of ulnar nerve, left upper limb: Secondary | ICD-10-CM | POA: Diagnosis not present

## 2017-02-13 DIAGNOSIS — M4722 Other spondylosis with radiculopathy, cervical region: Secondary | ICD-10-CM | POA: Diagnosis not present

## 2017-02-13 NOTE — Telephone Encounter (Signed)
-----   Message from Barrington Ellison, RN sent at 02/12/2017  4:39 PM EDT ----- Regarding: Concern about patient's low blood sugars Dr. Moshe Cipro, [name] is participating in the [facility] digital app for diabetes management. I am concerned about her frequent low blood sugars. I had asked her three times to contact you and I finally called her today and offered to contact you and she agreed.  These are her blood sugar readings for the last week: Fasting:  [phone] 44 Pre lunch: [phone] Pre dinner: 71, 119  She also requests that you send a prescription for refills for her test strips to the [facility] to be picked up at the [facility]. She is using the Accu-Chek Guide glucometer with the FastClix lancing device with the multi lancet drum and she is testing twice daily, she should be rechecking when she is low but she only rechecked once and that was this morning.    Thanks, Marcie Bal

## 2017-02-13 NOTE — Telephone Encounter (Signed)
pls send in testing supplies, needs reduced doses , tried calling her to discuss had to leave message

## 2017-02-13 NOTE — Patient Outreach (Signed)
Left message on nurse line at Dr. Griffin Dakin office advising that Kendra Hanson had another low blood sugar this morning, 67 at 6:58 am and 53 at 7:49 am.  An urgent message was sent to Kendra Hanson through the [facility] platform at 8:44 pm pm 4/2 to eat a 30 gm CHO and protein snack to decrease the likelihood of fasting hypoglycemia. A follow up urgent message was sent to her this morning at 10:01 am asking if she ate the snack as directed and to provide feedback on the cause for the low blood sugars this morning.  Barrington Ellison RN,CCM,CDE [place] Management Coordinator [facility] To [facility] Office Phone [phone] Office Fax [phone]    Left message at 5:30 pm asking Kendra Hanson to call me with the changes Dr. Moshe Cipro made to her insulin regimen so that changes can be made to [facility] care plan. Barrington Ellison RN,CCM,CDE [place] Management Coordinator [facility] To Well[facility] Office Phone [phone] Office Fax [phone]

## 2017-02-13 NOTE — Telephone Encounter (Signed)
Was calling back regarding the inbasket message she left you regarding Mrs. Wisler and her low am readings

## 2017-02-14 ENCOUNTER — Other Ambulatory Visit: Payer: Self-pay | Admitting: Family Medicine

## 2017-02-14 ENCOUNTER — Encounter: Payer: Self-pay | Admitting: Family Medicine

## 2017-02-14 DIAGNOSIS — G5622 Lesion of ulnar nerve, left upper limb: Secondary | ICD-10-CM | POA: Diagnosis not present

## 2017-02-14 DIAGNOSIS — G5602 Carpal tunnel syndrome, left upper limb: Secondary | ICD-10-CM | POA: Diagnosis not present

## 2017-02-14 MED FILL — LOSARTAN POTASSIUM 100 MG T: 100 | 90 days supply | Qty: 90 | Fill #1

## 2017-02-14 MED FILL — KLOR-CON M10 TABLET: 10 | 90 days supply | Qty: 270 | Fill #1

## 2017-02-14 NOTE — Telephone Encounter (Signed)
Vit d level = 48 on 8 19 17   Do you want to renew this?

## 2017-02-14 NOTE — Telephone Encounter (Signed)
Patient aware.

## 2017-02-15 MED FILL — PROGESTERONE 100 MG CAPSULE: 100 | 30 days supply | Qty: 30 | Fill #2

## 2017-02-16 ENCOUNTER — Encounter: Payer: Self-pay | Admitting: Family Medicine

## 2017-02-16 ENCOUNTER — Other Ambulatory Visit: Payer: Self-pay | Admitting: Family Medicine

## 2017-02-19 DIAGNOSIS — G5622 Lesion of ulnar nerve, left upper limb: Secondary | ICD-10-CM | POA: Diagnosis not present

## 2017-02-19 DIAGNOSIS — G5602 Carpal tunnel syndrome, left upper limb: Secondary | ICD-10-CM | POA: Diagnosis not present

## 2017-02-19 MED FILL — HYDROCODON-APAP 7.5-325: 7.5-325 | 5 days supply | Qty: 60 | Fill #0

## 2017-02-19 MED FILL — GABAPENTIN 300 MG CAPSULE: 300 | 30 days supply | Qty: 90 | Fill #0 | Status: TO

## 2017-02-22 ENCOUNTER — Other Ambulatory Visit: Payer: Self-pay

## 2017-02-22 MED ORDER — GLUCOSE BLOOD VI STRP: ORAL_STRIP | 5 refills | Status: DC

## 2017-02-22 MED ORDER — ACCU-CHEK FASTCLIX LANCETS MISC: 5 refills | Status: DC

## 2017-02-22 MED FILL — ACCU-CHEK GUIDE TEST STRIP: 30 days supply | Qty: 150 | Fill #0

## 2017-02-22 MED FILL — ACCU-CHEK FASTCLIX LANCETS: 30 days supply | Qty: 102 | Fill #0

## 2017-02-22 NOTE — Progress Notes (Signed)
accuchek ghuide

## 2017-03-01 MED FILL — ESTRADIOL PATCH 0.0375: 0.0375 | 28 days supply | Qty: 8 | Fill #7

## 2017-03-01 MED FILL — metFORMIN HCL 1000 MG TABS: 1000 | 90 days supply | Qty: 180 | Fill #3

## 2017-03-07 MED FILL — FUROSEMIDE 20 MG TABLET: 20 | 90 days supply | Qty: 270 | Fill #2

## 2017-03-07 MED FILL — HUMALOG MIX 75-25 KWIKPEN: (75-25) 100 | 88 days supply | Qty: 30 | Fill #1

## 2017-03-07 MED FILL — VICTOZA 18 MG/3 ML INJECT P: 18 | 30 days supply | Qty: 9 | Fill #3

## 2017-03-22 MED FILL — PROGESTERONE 100 MG CAPSULE: 100 | 30 days supply | Qty: 30 | Fill #3

## 2017-03-28 ENCOUNTER — Ambulatory Visit (HOSPITAL_COMMUNITY): Payer: 59 | Attending: Neurological Surgery | Admitting: Specialist

## 2017-03-28 DIAGNOSIS — R29898 Other symptoms and signs involving the musculoskeletal system: Secondary | ICD-10-CM | POA: Insufficient documentation

## 2017-03-28 DIAGNOSIS — R278 Other lack of coordination: Secondary | ICD-10-CM | POA: Insufficient documentation

## 2017-03-28 DIAGNOSIS — M25532 Pain in left wrist: Secondary | ICD-10-CM | POA: Diagnosis not present

## 2017-03-28 DIAGNOSIS — M25632 Stiffness of left wrist, not elsewhere classified: Secondary | ICD-10-CM | POA: Insufficient documentation

## 2017-03-28 DIAGNOSIS — M25522 Pain in left elbow: Secondary | ICD-10-CM | POA: Diagnosis not present

## 2017-03-28 NOTE — Patient Instructions (Signed)
AROM: Wrist Flexion / Extension  Actively bend left wrist forward then back as far as possible. Repeat ____ times per set. Do ____ sets per session. Do ____ sessions per day.  Copyright  VHI. All rights reserved.  AROM: Wrist Radial / Ulnar Deviation   Gently bend left wrist from side to side as far as possible. Repeat ____ times per set. Do ____ sets per session. Do ____ sessions per day.  Copyright  VHI. All rights reserved.  AROM: Forearm Pronation / Supination   With left arm in handshake position, slowly rotate palm down until stretch is felt. Relax. Then rotate palm up until stretch is felt. Repeat ____ times per set. Do ____ sets per session. Do ____ sessions per day.  Copyright  VHI. All rights reserved.   AROM: Elbow Flexion / Extension    With left hand palm up, gently bend elbow as far as possible. Then straighten arm as far as possible. Repeat ____ times per set. Do ____ sets per session. Do ____ sessions per day.  Copyright  VHI. All rights reserved.   Scar Tissue Massage    Place pad of fingertip on scar area. Apply steady downward pressure while moving in circular fashion. Use another fin-ger on top to assist. Repeat until entire scar has been covered. Repeat ____ times. Do ____ sessions per day.  Copyright  VHI. All rights reserved.

## 2017-03-29 ENCOUNTER — Encounter (HOSPITAL_COMMUNITY): Payer: Self-pay | Admitting: Specialist

## 2017-03-29 ENCOUNTER — Ambulatory Visit (HOSPITAL_COMMUNITY): Payer: 59

## 2017-03-29 DIAGNOSIS — R29898 Other symptoms and signs involving the musculoskeletal system: Secondary | ICD-10-CM | POA: Diagnosis not present

## 2017-03-29 DIAGNOSIS — M25632 Stiffness of left wrist, not elsewhere classified: Secondary | ICD-10-CM

## 2017-03-29 DIAGNOSIS — M25522 Pain in left elbow: Secondary | ICD-10-CM

## 2017-03-29 DIAGNOSIS — R278 Other lack of coordination: Secondary | ICD-10-CM

## 2017-03-29 DIAGNOSIS — M25532 Pain in left wrist: Secondary | ICD-10-CM

## 2017-03-29 MED FILL — ESTRADIOL PATCH 0.0375: 0.0375 | 28 days supply | Qty: 8 | Fill #8

## 2017-03-29 MED FILL — ACCU-CHEK GUIDE TEST STRIP: 50 days supply | Qty: 150 | Fill #1

## 2017-03-29 MED FILL — ACCU-CHEK FASTCLIX LANCETS: 30 days supply | Qty: 102 | Fill #1

## 2017-03-29 MED FILL — DILTIAZEM HCL ER 360 MG CAP: 360 | 90 days supply | Qty: 90 | Fill #3

## 2017-03-29 NOTE — Therapy (Addendum)
[place], [place], [address] Phone: [phone]   Fax:  [phone]  Occupational Therapy Evaluation  Patient Details  Name: Kendra Hanson MRN: [id number] Date of Birth: 06-Sep-1961 Referring Provider: Dr. Cyndy Freeze  Encounter Date: 03/28/2017      OT End of Session - 03/29/17 1442    Visit Number 1   Number of Visits 16   Date for OT Re-Evaluation 05/27/17  04/27/17   Authorization Type UMR 24 visits per year   Authorization Time Period yearly   Authorization - Visit Number 1   Authorization - Number of Visits 24   OT Start Time 1350   OT Stop Time 1435   OT Time Calculation (min) 45 min   Activity Tolerance Patient tolerated treatment well   Behavior During Therapy [facility] for tasks assessed/performed      Past Medical History:  Diagnosis Date  . Anemia   . Fibroids    Uterine  . GERD (gastroesophageal reflux disease)   . Hyperlipidemia 2008   Lipid profile in 04/2011:136, 53, 43  . Hypertension 2008   Normal CBC and CMet in 2012; negative stress nuclear in 2006- patient asymptomatic  . Insulin dependent diabetes mellitus ([place]) 1996  . Multiple allergies    perennial  . Normocytic normochromic anemia 12/27/2015  . Obesity   . PONV (postoperative nausea and vomiting)   . Sleep apnea    CPAP    Past Surgical History:  Procedure Laterality Date  . COLONOSCOPY N/A 04/05/2015   Procedure: COLONOSCOPY;  Surgeon: Rogene Houston, MD;  [facility];  Service: Endoscopy;  Laterality: N/A;  730  . DENTAL SURGERY    . ESOPHAGOGASTRODUODENOSCOPY N/A 01/21/2016   Procedure: ESOPHAGOGASTRODUODENOSCOPY (EGD);  Surgeon: Rogene Houston, MD;  Location: [facility];  Service: Endoscopy;  Laterality: N/A;  11:15  . REFRACTIVE SURGERY  2011   Bilateral, two seperate occasions first in 2006  . REMOVAL OF IMPLANT    . RETINAL DETACHMENT SURGERY Bilateral 05/2005    There were no  vitals filed for this visit.      Subjective Assessment - 03/29/17 1438    Subjective  S:  I thought all of my pain would be gone after surgery   Pertinent History Kendra Hanson reports experiencing numbness and pain in her left hand for several years.  She consulted with Dr. Cyndy Freeze and had surgery for left carpal tunnel release and left ulnar nerve decompression on 02/19/17.  She has been referred to occupational therapy for evaluation and treatment.     Special Tests FOTO:  43/100   Patient Stated Goals I want to get rid of the pain and improve use of my left hand   Currently in Pain? Yes   Pain Score 7    Pain Location Wrist   Pain Orientation Left   Pain Descriptors / Indicators Aching   Pain Type Acute pain   Pain Onset More than a month ago   Pain Frequency Constant   Aggravating Factors  constant, use increases pain   Pain Relieving Factors rest   Effect of Pain on Daily Activities decreased ability to use her left arm with daily tasks           [facility] OT Assessment - 03/29/17 0001      Assessment   Diagnosis S/P Left Carpal Tunnel Release and Ulnar Nerve Decompression   Referring Provider Dr. Cyndy Freeze   Onset Date 02/07/17  Prior Therapy n/a     Precautions   Precautions None     Restrictions   Weight Bearing Restrictions No     Balance Screen   Has the patient fallen in the past 6 months No   Has the patient had a decrease in activity level because of a fear of falling?  No   Is the patient reluctant to leave their home because of a fear of falling?  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   Leisure attending church,  spending time with family     ADL   ADL comments Patient is not able to work due to recent surgery, she has diffiucty grabbing items with her left hand, grasping, squeezing, and lifting are all difficult activities     Written Expression   Dominant Hand Right     Vision - History   Baseline Vision No visual deficits     Cognition   Overall Cognitive Status Within Functional Limits for tasks assessed     Observation/Other Assessments   Skin Integrity 3.5 cm healed surgical incision over carpal tunnel with dense scar tissue, 10.0 cm along ulnar groove left elbow healed surgical incision with dense scar tissue    Focus on Therapeutic Outcomes (FOTO)  43/100     Sensation   Light Touch Appears Intact  pins and needles present, light touch sensation in tact     Coordination   9 Hole Peg Test Right;Left   Right 9 Hole Peg Test 25.42"   Left 9 Hole Peg Test 31.08"     ROM / Strength   AROM / PROM / Strength AROM;PROM;Strength     Palpation   Palpation comment mod max fascial restrictions in left elbow, forearm, wrist, dense scar tissue both surgical scars      AROM   Overall AROM Comments assessed in seated   AROM Assessment Site Elbow;Forearm;Wrist   Right/Left Elbow Left   Left Elbow Flexion 135   Left Elbow Extension 0   Right/Left Forearm Left   Left Forearm Pronation 90 Degrees   Left Forearm Supination 60 Degrees   Right/Left Wrist Left   Left Wrist Extension 30 Degrees   Left Wrist Flexion 50 Degrees   Left Wrist Radial Deviation --  WFL   Left Wrist Ulnar Deviation --  WFL     PROM   Overall PROM Comments P/ROM is [facility]      Strength   Overall Strength Comments assessed in seated   Strength Assessment Site Elbow;Forearm;Wrist   Right/Left Elbow Left   Left Elbow Flexion 4-/5   Left Elbow Extension 4-/5   Right/Left Forearm Left   Left Forearm Pronation 4-/5   Left Forearm Supination 4-/5   Right/Left Wrist Left   Left Wrist Flexion 4-/5   Left Wrist  Extension 4-/5     Hand Function   Right Hand Grip (lbs) 62   Right Hand Lateral Pinch 11 lbs   Right Hand 3 Point Pinch 10 lbs   Left Hand Grip (lbs) 40   Left Hand Lateral Pinch 7 lbs   Left 3 point pinch 4 lbs  OT Treatments/Exercises (OP) - 03/29/17 0001      Manual Therapy   Manual Therapy Myofascial release   Manual therapy comments manual therapy completed seperately from all other interventions this date   Myofascial Release myofascial release and manual stretching to left elbow region, forearm, wrist, and hand to decrease pain and restrictions and improve pain free mobility in left arm.  Scar release to both surgical scars to decreae pliability and decrease restrictions                OT Education - 03/29/17 1441    Education provided Yes   Education Details elbow, forearm, wrist A/ROM and tendon glides    Person(s) Educated Patient   Methods Explanation;Demonstration;Handout   Comprehension Verbalized understanding;Returned demonstration          OT Short Term Goals - 03/29/17 2144      OT SHORT TERM GOAL #1   Title Patient will be educated on a HEP for improved function of LUE.     Time 4   Period Weeks   Status New     OT SHORT TERM GOAL #2   Title Patient will be independent with donning and doffing therapist fabricated elbow extenison splint for decreased pressure on ulnar nerve.    Time 4   Period Weeks   Status New     OT SHORT TERM GOAL #3   Title Patient will improve LUE AROM by 5 degrees for improved ability to don and doff shirts.   Time 4   Period Weeks   Status New     OT SHORT TERM GOAL #4   Title Patient will improve left arm strength to 4/5 for greater ability to lift titems over head at work.     Time 4   Period Weeks   Status New     OT SHORT TERM GOAL #5   Title Patient will improve her left grip strength by 10# and pinch by 2 pounds for greater ability to open containers with left hand.   Time 4    Period Weeks   Status New     Additional Short Term Goals   Additional Short Term Goals Yes     OT SHORT TERM GOAL #6   Title Patient will improve fine motor coordination as evidence by improving completion time on nine hole peg test by 3".   Time 4   Period Weeks   Status New     OT SHORT TERM GOAL #7   Title Patient will decrease fascial and scar restrictions to moderate in her left arm and hand.     Time 4   Period Weeks   Status New           OT Long Term Goals - 03/30/17 0757      OT LONG TERM GOAL #1   Title Patient will return to prior level of independence with all daily tasks.    Time 8   Period Weeks   Status New     OT LONG TERM GOAL #2   Title Patient will improve left arm AROM to [facility] for improved ability to reach [place].   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   Time 8   Period Weeks  Status New     OT LONG TERM GOAL #4   Title Patient will improve fine motor coordination by decreasimg completion time on nine hole peg test to 26" or less.   Time 8   Period Weeks   Status New     OT LONG TERM GOAL #5   Title Patient will have 5/5 strength in left arm for improved ability to complete work tasks.    Time 8   Period Weeks   Status New     OT LONG TERM GOAL #6   Title Patients scar tissue and fascia will have min restrictions for greater mobility and strength in left arm.    Time 8   Period Weeks   Status New     OT LONG TERM GOAL #7   Title Patient will have 2/10 pain or less in left arm when completing work duties.    Time 8   Period Weeks   Status New     OT LONG TERM GOAL #8   Title Patient will have normal sensation in her left arm and hand for greater safety with ADL completion.    Time 8   Period Weeks   Status New               Plan -  03/29/17 2143    Clinical Impression Statement A: Patient is a 56 year old female with past medical history signifcant for HTN and diabetes. Patient underwent left carpal tunnel release surgery and ulnar decompression surgery on 02/07/17. Patient is right hand dominant, however, she is not able to work, complete many BADL and IADL tasks due to her recenty surgery    Rehab Potential Good   OT Frequency 2x / week   OT Duration 8 weeks   OT Treatment/Interventions Self-care/ADL training;Cryotherapy;Electrical Stimulation;Moist Heat;Parrafin;Contrast Bath;Ultrasound;Iontophoresis;Therapeutic exercise;Neuromuscular education;Energy conservation;DME and/or AE instruction;Passive range of motion;Manual Therapy;Splinting;Therapeutic exercises;Therapeutic activities;Patient/family education   Plan P: Skilled OT intervention is indicated to decrease scar and fascial restrictions, pain and improve fine motor coordination, range of motion, and strength needed in left wrist and elbow and hand needed to return to prior level of function with her B/IADLs, work, and leisure activities. Next session: review Plan of care, fabricate elbow extension splint for night use. begin manual therapy and AA/ROM and A/ROM, progress as tolerated.     OT Home Exercise Plan 03/29/17:  tendon glides and A/ROM of wrist, elbow, hand.       Patient will benefit from skilled therapeutic intervention in order to improve the following deficits and impairments:  Decreased coordination, Decreased range of motion, Decreased scar mobility, Decreased strength, Increased fascial restricitons, Impaired UE functional use, Pain  Visit Diagnosis: Pain in left wrist - Plan: Ot plan of care cert/re-cert  Pain in left elbow - Plan: Ot plan of care cert/re-cert  Stiffness of left wrist, not elsewhere classified - Plan: Ot plan of care cert/re-cert  Other symptoms and signs involving the musculoskeletal system - Plan: Ot plan of care  cert/re-cert  Other lack of coordination - Plan: Ot plan of care cert/re-cert    Problem List Patient Active Problem List   Diagnosis Date Noted  . Upper extremity pain, diffuse, left 01/26/2017  . Encounter for medication management 11/30/2016  . Osteoarthritis of both knees 12/27/2015  . Normocytic normochromic anemia 12/27/2015  . Carpal tunnel syndrome 07/12/2015  . Bilateral leg numbness 07/12/2015  . Pigmented skin lesion 05/06/2015  . Iron deficiency 03/14/2015  . Seasonal allergies 02/01/2015  .  Piles (hemorrhoids) 09/25/2013  . Sleep apnea 06/13/2013  . Vitamin D deficiency 06/05/2012  . GERD (gastroesophageal reflux disease) 06/03/2012  . Leiomyoma of uterus 10/30/2008  . Type II diabetes mellitus with renal manifestations ([place]) 02/25/2008  . Hyperlipidemia LDL goal <100 02/25/2008  . Morbid obesity ([place]) 02/25/2008  . Essential hypertension 02/25/2008    Penelope Galas [place] 03/30/2017, 8:00 AM  [place], [place], [address] Phone: [phone]   Fax:  [phone]  Name: Kendra Hanson MRN: [id number] Date of Birth: 12-20-1960

## 2017-03-30 ENCOUNTER — Encounter (HOSPITAL_COMMUNITY): Payer: Self-pay

## 2017-03-30 ENCOUNTER — Telehealth (HOSPITAL_COMMUNITY): Payer: Self-pay

## 2017-03-30 NOTE — Telephone Encounter (Signed)
Called patient to follow up on fabricated elbow splint that was made in clinic yesterday. Patient reports that it went well last night and she was able to wear it for 3 hours.   Ailene Ravel, OTR/L,CBIS  [phone]

## 2017-03-30 NOTE — Therapy (Signed)
[place], [place], [address] Phone: [phone]   Fax:  [phone]  Occupational Therapy Treatment  Patient Details  Name: Kendra Hanson MRN: [id number] Date of Birth: [birth year] Referring Provider: Dr. Cyndy Freeze  Encounter Date: 03/29/2017      OT End of Session - 03/30/17 1233    Visit Number 2   Number of Visits 16   Date for OT Re-Evaluation 05/27/17  04/27/17   Authorization Type UMR 24 visits per year   Authorization Time Period yearly   Authorization - Visit Number 2   Authorization - Number of Visits 24   OT Start Time 1602   OT Stop Time 1700   OT Time Calculation (min) 58 min   Activity Tolerance Patient tolerated treatment well   Behavior During Therapy [facility] for tasks assessed/performed      Past Medical History:  Diagnosis Date  . Anemia   . Fibroids    Uterine  . GERD (gastroesophageal reflux disease)   . Hyperlipidemia 2008   Lipid profile in 04/2011:136, 53, 43  . Hypertension 2008   Normal CBC and CMet in 2012; negative stress nuclear in 2006- patient asymptomatic  . Insulin dependent diabetes mellitus ([place]) 1996  . Multiple allergies    perennial  . Normocytic normochromic anemia 12/27/2015  . Obesity   . PONV (postoperative nausea and vomiting)   . Sleep apnea    CPAP    Past Surgical History:  Procedure Laterality Date  . COLONOSCOPY N/A 04/05/2015   Procedure: COLONOSCOPY;  Surgeon: Rogene Houston, MD;  [facility];  Service: Endoscopy;  Laterality: N/A;  730  . DENTAL SURGERY    . ESOPHAGOGASTRODUODENOSCOPY N/A 01/21/2016   Procedure: ESOPHAGOGASTRODUODENOSCOPY (EGD);  Surgeon: Rogene Houston, MD;  Location: [facility];  Service: Endoscopy;  Laterality: N/A;  11:15  . REFRACTIVE SURGERY  2011   Bilateral, two seperate occasions first in 2006  . REMOVAL OF IMPLANT    . RETINAL DETACHMENT SURGERY Bilateral 05/2005    There were no vitals filed for this  visit.      Subjective Assessment - 03/30/17 1229    Subjective  S: When I sleep I'm all over the place so I hope this will stay on.    Currently in Pain? Yes   Pain Score 5    Pain Location Wrist   Pain Orientation Left   Pain Descriptors / Indicators Aching   Pain Type Acute pain            [facility] OT Assessment - 03/30/17 1230      Assessment   Diagnosis S/P Left Carpal Tunnel Release and Ulnar Nerve Decompression     Precautions   Precautions None                  OT Treatments/Exercises (OP) - 03/30/17 1230      Exercises   Exercises Elbow     Splinting   Splinting Night time elbow extension splint fabricated to faciliate elbow extension. To be worn at night time only. Patient was educated on wearing schedule, donning and doffing, precautions, and care/cleaning instructions.                 OT Education - 03/30/17 1233    Education provided Yes   Education Details Splinting instructions given.   Person(s) Educated Patient   Methods Explanation;Handout;Verbal cues   Comprehension Verbalized understanding  OT Short Term Goals - 03/30/17 1236      OT SHORT TERM GOAL #1   Title Patient will be educated on a HEP for improved function of LUE.     Time 4   Period Weeks   Status On-going     OT SHORT TERM GOAL #2   Title Patient will be independent with donning and doffing therapist fabricated elbow extenison splint for decreased pressure on ulnar nerve.    Time 4   Period Weeks   Status On-going     OT SHORT TERM GOAL #3   Title Patient will improve LUE AROM by 5 degrees for improved ability to don and doff shirts.   Time 4   Period Weeks   Status On-going     OT SHORT TERM GOAL #4   Title Patient will improve left arm strength to 4/5 for greater ability to lift titems over head at work.     Time 4   Period Weeks   Status On-going     OT SHORT TERM GOAL #5   Title Patient will improve her left grip strength by 10# and  pinch by 2 pounds for greater ability to open containers with left hand.   Time 4   Period Weeks   Status On-going     OT SHORT TERM GOAL #6   Title Patient will improve fine motor coordination as evidence by improving completion time on nine hole peg test by 3".   Time 4   Period Weeks   Status On-going     OT SHORT TERM GOAL #7   Title Patient will decrease fascial and scar restrictions to moderate in her left arm and hand.     Time 4   Period Weeks   Status On-going           OT Long Term Goals - 03/30/17 1236      OT LONG TERM GOAL #1   Title Patient will return to prior level of independence with all daily tasks.    Time 8   Period Weeks   Status On-going     OT LONG TERM GOAL #2   Title Patient will improve left arm AROM to [facility] for improved ability to reach [place].   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   Time 8   Period Weeks   Status On-going     OT LONG TERM GOAL #4   Title Patient will improve fine motor coordination by decreasimg completion time on nine hole peg test to 26" or less.   Time 8   Period Weeks   Status On-going     OT LONG TERM GOAL #5   Title Patient will have 5/5 strength in left arm for improved ability to complete work tasks.    Time 8   Period Weeks   Status On-going     OT LONG TERM GOAL #6   Title Patients scar tissue and fascia will have min restrictions for greater mobility and strength in left arm.    Time 8   Period Weeks   Status On-going     OT LONG TERM GOAL #7   Title Patient will have 2/10 pain or less in left arm when completing work duties.  Time 8   Period Weeks   Status On-going     OT LONG TERM GOAL #8   Title Patient will have normal sensation in her left arm and hand for greater safety with ADL completion.    Time 8    Period Weeks   Status On-going               Plan - 03/30/17 1233    Clinical Impression Statement A: Splint fabrication completed this date. Elbow extension splint fabricated for night time use. All education completed.   Plan P: Patient will be called on 5/18 to follow up on splint. Next session: Begin manual therapy around scaring. bring with A/ROM elbow and wrist after manual stretching.    Consulted and Agree with Plan of Care Patient      Patient will benefit from skilled therapeutic intervention in order to improve the following deficits and impairments:  Decreased coordination, Decreased range of motion, Decreased scar mobility, Decreased strength, Increased fascial restricitons, Impaired UE functional use, Pain  Visit Diagnosis: Pain in left wrist  Pain in left elbow  Stiffness of left wrist, not elsewhere classified  Other symptoms and signs involving the musculoskeletal system  Other lack of coordination    Problem List Patient Active Problem List   Diagnosis Date Noted  . Upper extremity pain, diffuse, left 01/26/2017  . Encounter for medication management 11/30/2016  . Osteoarthritis of both knees 12/27/2015  . Normocytic normochromic anemia 12/27/2015  . Carpal tunnel syndrome 07/12/2015  . Bilateral leg numbness 07/12/2015  . Pigmented skin lesion 05/06/2015  . Iron deficiency 03/14/2015  . Seasonal allergies 02/01/2015  . Piles (hemorrhoids) 09/25/2013  . Sleep apnea 06/13/2013  . Vitamin D deficiency 06/05/2012  . GERD (gastroesophageal reflux disease) 06/03/2012  . Leiomyoma of uterus 10/30/2008  . Type II diabetes mellitus with renal manifestations ([place]) 02/25/2008  . Hyperlipidemia LDL goal <100 02/25/2008  . Morbid obesity ([place]) 02/25/2008  . Essential hypertension 02/25/2008   Ailene Ravel, OTR/L,CBIS  [phone]  03/30/2017, 12:37 PM  [place] [address] [place], [place],  [address] Phone: [phone]   Fax:  [phone]  Name: Kendra Hanson MRN: [id number] Date of Birth: [birth year]

## 2017-03-30 NOTE — Addendum Note (Signed)
Addended by: Vangie Bicker on: 03/30/2017 08:03 AM   Modules accepted: Orders

## 2017-04-02 ENCOUNTER — Ambulatory Visit (HOSPITAL_COMMUNITY): Payer: 59 | Admitting: Occupational Therapy

## 2017-04-03 ENCOUNTER — Other Ambulatory Visit: Payer: Self-pay | Admitting: Family Medicine

## 2017-04-03 ENCOUNTER — Encounter (HOSPITAL_COMMUNITY): Payer: Self-pay | Admitting: Occupational Therapy

## 2017-04-03 ENCOUNTER — Ambulatory Visit (HOSPITAL_COMMUNITY): Payer: 59 | Admitting: Occupational Therapy

## 2017-04-03 DIAGNOSIS — M25522 Pain in left elbow: Secondary | ICD-10-CM | POA: Diagnosis not present

## 2017-04-03 DIAGNOSIS — R278 Other lack of coordination: Secondary | ICD-10-CM

## 2017-04-03 DIAGNOSIS — M25532 Pain in left wrist: Secondary | ICD-10-CM

## 2017-04-03 DIAGNOSIS — M25632 Stiffness of left wrist, not elsewhere classified: Secondary | ICD-10-CM | POA: Diagnosis not present

## 2017-04-03 DIAGNOSIS — R29898 Other symptoms and signs involving the musculoskeletal system: Secondary | ICD-10-CM | POA: Diagnosis not present

## 2017-04-03 NOTE — Therapy (Addendum)
Kendra Hanson, [place], [address] Phone: [phone]   Fax:  [phone]  Occupational Therapy Treatment  Patient Details  Name: Kendra Hanson MRN: [id number] Date of Birth: 11/05/1961 Referring Provider: Dr. Cyndy Freeze  Encounter Date: 04/03/2017      OT End of Session - 04/03/17 1621    Visit Number 3   Number of Visits 16   Date for OT Re-Evaluation 05/27/17  04/27/17   Authorization Type UMR 24 visits per year   Authorization Time Period yearly   Authorization - Visit Number 3   Authorization - Number of Visits 24   OT Start Time 1520   OT Stop Time 1600   OT Time Calculation (min) 40 min   Activity Tolerance Patient tolerated treatment well   Behavior During Therapy [facility] for tasks assessed/performed      Past Medical History:  Diagnosis Date  . Anemia   . Fibroids    Uterine  . GERD (gastroesophageal reflux disease)   . Hyperlipidemia 2008   Lipid profile in 04/2011:136, 53, 43  . Hypertension 2008   Normal CBC and CMet in 2012; negative stress nuclear in 2006- patient asymptomatic  . Insulin dependent diabetes mellitus ([place]) 1996  . Multiple allergies    perennial  . Normocytic normochromic anemia 12/27/2015  . Obesity   . PONV (postoperative nausea and vomiting)   . Sleep apnea    CPAP    Past Surgical History:  Procedure Laterality Date  . COLONOSCOPY N/A 04/05/2015   Procedure: COLONOSCOPY;  Surgeon: Rogene Houston, MD;  [facility];  Service: Endoscopy;  Laterality: N/A;  730  . DENTAL SURGERY    . ESOPHAGOGASTRODUODENOSCOPY N/A 01/21/2016   Procedure: ESOPHAGOGASTRODUODENOSCOPY (EGD);  Surgeon: Rogene Houston, MD;  [facility];  Service: Endoscopy;  Laterality: N/A;  11:15  . REFRACTIVE SURGERY  2011   Bilateral, two seperate occasions first in 2006  . REMOVAL OF IMPLANT    . RETINAL DETACHMENT SURGERY Bilateral 05/2005    There were no vitals filed for this  visit.      Subjective Assessment - 04/03/17 1520    Subjective  S: I didn't wear my splint over the last 2 days because I wasn't sure if it was fitting right.    Currently in Pain? No/denies            [facility] OT Assessment - 04/03/17 1520      Assessment   Diagnosis S/P Left Carpal Tunnel Release and Ulnar Nerve Decompression     Precautions   Precautions None                  OT Treatments/Exercises (OP) - 04/03/17 1522      Exercises   Exercises Elbow;Wrist;Hand     Elbow Exercises   Elbow Flexion PROM;5 reps   Elbow Extension PROM;5 reps   Forearm Supination PROM;5 reps;AROM;10 reps   Forearm Pronation PROM;5 reps;AROM;10 reps   Wrist Flexion PROM;5 reps;AROM;10 reps   Wrist Extension PROM;5 reps;AROM;10 reps     Fine Motor Coordination   Tendon Glides 10X     Additional Wrist Exercises   Sponges Pt picked up 20 sponges and placed in bucket using red resistive clothespin with left hand; 2 trials using 3 point and lateral pinch. Mod difficulty towards end of task due to weakness and fatigue     Hand Exercises   Other Hand Exercises composite digit flexion/extension, mod  difficulty with making full fist     Splinting   Splinting Assessed fit of elbow extension splint and problem-solved application with pt     Manual Therapy   Manual Therapy Myofascial release   Manual therapy comments manual therapy completed seperately from all other interventions this date   Myofascial Release myofascial release and manual stretching to left elbow region, forearm, wrist, and hand to decrease pain and restrictions and improve pain free mobility in left arm.  Scar release to both surgical scars to decreae pliability and decrease restrictions                   OT Short Term Goals - 03/30/17 1236      OT SHORT TERM GOAL #1   Title Patient will be educated on a HEP for improved function of LUE.     Time 4   Period Weeks   Status On-going     OT SHORT  TERM GOAL #2   Title Patient will be independent with donning and doffing therapist fabricated elbow extenison splint for decreased pressure on ulnar nerve.    Time 4   Period Weeks   Status On-going     OT SHORT TERM GOAL #3   Title Patient will improve LUE AROM by 5 degrees for improved ability to don and doff shirts.   Time 4   Period Weeks   Status On-going     OT SHORT TERM GOAL #4   Title Patient will improve left arm strength to 4/5 for greater ability to lift titems over head at work.     Time 4   Period Weeks   Status On-going     OT SHORT TERM GOAL #5   Title Patient will improve her left grip strength by 10# and pinch by 2 pounds for greater ability to open containers with left hand.   Time 4   Period Weeks   Status On-going     OT SHORT TERM GOAL #6   Title Patient will improve fine motor coordination as evidence by improving completion time on nine hole peg test by 3".   Time 4   Period Weeks   Status On-going     OT SHORT TERM GOAL #7   Title Patient will decrease fascial and scar restrictions to moderate in her left arm and hand.     Time 4   Period Weeks   Status On-going           OT Long Term Goals - 03/30/17 1236      OT LONG TERM GOAL #1   Title Patient will return to prior level of independence with all daily tasks.    Time 8   Period Weeks   Status On-going     OT LONG TERM GOAL #2   Title Patient will improve left arm AROM to [facility] for improved ability to reach [place].   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   Time 8   Period Weeks   Status On-going     OT LONG TERM GOAL #4   Title Patient will improve fine motor coordination by decreasimg completion time on nine hole peg test to 26" or less.   Time 8   Period Weeks  Status On-going     OT LONG  TERM GOAL #5   Title Patient will have 5/5 strength in left arm for improved ability to complete work tasks.    Time 8   Period Weeks   Status On-going     OT LONG TERM GOAL #6   Title Patients scar tissue and fascia will have min restrictions for greater mobility and strength in left arm.    Time 8   Period Weeks   Status On-going     OT LONG TERM GOAL #7   Title Patient will have 2/10 pain or less in left arm when completing work duties.    Time 8   Period Weeks   Status On-going     OT LONG TERM GOAL #8   Title Patient will have normal sensation in her left arm and hand for greater safety with ADL completion.    Time 8   Period Weeks   Status On-going               Plan - 04/03/17 1621    Clinical Impression Statement A: Pt brought splint to session, asking about proper fit. Pt donned splint and OT instructed for proper fit at elbow, as pt had splint too low on forearm. Completed manual therapy, pt with tenderness and restrictions along scaring, does report improvement since evaluation at wrist scar. Verbal cuing for technique with exercises.    Plan P: Continue with manual therapy, ultrasound for pain and scar restrictions with manual stretching to wrist immediately after [place].    OT Home Exercise Plan 03/29/17:  tendon glides and A/ROM of wrist, elbow, hand.    Consulted and Agree with Plan of Care Patient      Patient will benefit from skilled therapeutic intervention in order to improve the following deficits and impairments:  Decreased coordination, Decreased range of motion, Decreased scar mobility, Decreased strength, Increased fascial restricitons, Impaired UE functional use, Pain  Visit Diagnosis: Pain in left wrist  Pain in left elbow  Stiffness of left wrist, not elsewhere classified  Other symptoms and signs involving the musculoskeletal system  Other lack of coordination    Problem List Patient Active Problem List   Diagnosis Date Noted  . Upper  extremity pain, diffuse, left 01/26/2017  . Encounter for medication management 11/30/2016  . Osteoarthritis of both knees 12/27/2015  . Normocytic normochromic anemia 12/27/2015  . Carpal tunnel syndrome 07/12/2015  . Bilateral leg numbness 07/12/2015  . Pigmented skin lesion 05/06/2015  . Iron deficiency 03/14/2015  . Seasonal allergies 02/01/2015  . Piles (hemorrhoids) 09/25/2013  . Sleep apnea 06/13/2013  . Vitamin D deficiency 06/05/2012  . GERD (gastroesophageal reflux disease) 06/03/2012  . Leiomyoma of uterus 10/30/2008  . Type II diabetes mellitus with renal manifestations ([place]) 02/25/2008  . Hyperlipidemia LDL goal <100 02/25/2008  . Morbid obesity ([place]) 02/25/2008  . Essential hypertension 02/25/2008   Guadelupe Sabin, OTR/L  [phone] 04/03/2017, 4:24 PM  [facility] [address] [place], [place], [address] Phone: [phone]   Fax:  [phone]  Name: Kendra Hanson MRN: [id number] Date of Birth: [birth year]

## 2017-04-04 ENCOUNTER — Other Ambulatory Visit: Payer: Self-pay | Admitting: Family Medicine

## 2017-04-04 MED FILL — PRAVASTATIN NA 40 MG TAB: 40 | 90 days supply | Qty: 90 | Fill #0

## 2017-04-05 ENCOUNTER — Ambulatory Visit (HOSPITAL_COMMUNITY): Payer: 59

## 2017-04-05 ENCOUNTER — Encounter (HOSPITAL_COMMUNITY): Payer: Self-pay

## 2017-04-05 ENCOUNTER — Encounter (HOSPITAL_COMMUNITY): Payer: 59

## 2017-04-05 DIAGNOSIS — M25522 Pain in left elbow: Secondary | ICD-10-CM | POA: Diagnosis not present

## 2017-04-05 DIAGNOSIS — R278 Other lack of coordination: Secondary | ICD-10-CM

## 2017-04-05 DIAGNOSIS — M25532 Pain in left wrist: Secondary | ICD-10-CM | POA: Diagnosis not present

## 2017-04-05 DIAGNOSIS — R29898 Other symptoms and signs involving the musculoskeletal system: Secondary | ICD-10-CM | POA: Diagnosis not present

## 2017-04-05 DIAGNOSIS — M25632 Stiffness of left wrist, not elsewhere classified: Secondary | ICD-10-CM

## 2017-04-05 NOTE — Therapy (Signed)
[place], [place], [address] Phone: [phone]   Fax:  [phone]  Occupational Therapy Treatment  Patient Details  Name: Kendra Hanson MRN: [id number] Date of Birth: Jul 24, 1961 Referring Provider: Dr. Cyndy Freeze  Encounter Date: 04/05/2017      OT End of Session - 04/05/17 0946    Visit Number 4   Number of Visits 16   Date for OT Re-Evaluation 05/27/17  mini reassess: 04/27/17   Authorization Type UMR 24 visits per year   Authorization Time Period yearly   Authorization - Visit Number 4   Authorization - Number of Visits 24   OT Start Time [phone]   OT Stop Time 0945   OT Time Calculation (min) 42 min   Activity Tolerance Patient tolerated treatment well   Behavior During Therapy [facility] for tasks assessed/performed      Past Medical History:  Diagnosis Date  . Anemia   . Fibroids    Uterine  . GERD (gastroesophageal reflux disease)   . Hyperlipidemia 2008   Lipid profile in 04/2011:136, 53, 43  . Hypertension 2008   Normal CBC and CMet in 2012; negative stress nuclear in 2006- patient asymptomatic  . Insulin dependent diabetes mellitus ([place]) 1996  . Multiple allergies    perennial  . Normocytic normochromic anemia 12/27/2015  . Obesity   . PONV (postoperative nausea and vomiting)   . Sleep apnea    CPAP    Past Surgical History:  Procedure Laterality Date  . COLONOSCOPY N/A 04/05/2015   Procedure: COLONOSCOPY;  Surgeon: Rogene Houston, MD;  [facility];  Service: Endoscopy;  Laterality: N/A;  730  . DENTAL SURGERY    . ESOPHAGOGASTRODUODENOSCOPY N/A 01/21/2016   Procedure: ESOPHAGOGASTRODUODENOSCOPY (EGD);  Surgeon: Rogene Houston, MD;  [facility];  Service: Endoscopy;  Laterality: N/A;  11:15  . REFRACTIVE SURGERY  2011   Bilateral, two seperate occasions first in 2006  . REMOVAL OF IMPLANT    . RETINAL DETACHMENT SURGERY Bilateral 05/2005    There were no vitals filed  for this visit.      Subjective Assessment - 04/05/17 0918    Subjective  S: My hand is feeling stiff and it is really tingling.   Currently in Pain? Yes   Pain Score 6    Pain Location Wrist   Pain Orientation Left   Pain Descriptors / Indicators Tingling   Pain Type Acute pain   Pain Radiating Towards N/A   Pain Onset More than a month ago   Pain Frequency Intermittent   Aggravating Factors  use   Pain Relieving Factors rest   Effect of Pain on Daily Activities decreased ability to use her left arm with daily tasks.   Multiple Pain Sites No            [facility] OT Assessment - 04/05/17 0942      Assessment   Diagnosis S/P Left Carpal Tunnel Release and Ulnar Nerve Decompression     Precautions   Precautions None                  OT Treatments/Exercises (OP) - 04/05/17 0001      Exercises   Exercises Elbow;Wrist;Hand     Elbow Exercises   Elbow Flexion PROM;5 reps   Elbow Extension PROM;5 reps   Forearm Supination PROM;5 reps;AROM;10 reps   Forearm Pronation PROM;5 reps;AROM;10 reps   Wrist Flexion PROM;5 reps;AROM;10 reps  Wrist Extension PROM;5 reps;AROM;10 reps     Additional Elbow Exercises   Hand Gripper with Large Beads all beads with gripper set at 22#   Hand Gripper with Medium Beads all beads with gripper set at 22#     Fine Motor Coordination   Tendon Glides 10X     Additional Wrist Exercises   Sponges 20, 22     Modalities   Modalities Ultrasound     Ultrasound   Ultrasound Location Elbow and wirst   Ultrasound Parameters 1.5W/cm2; each session five minutes   Ultrasound Goals Other (Comment)  To decrease scar adhesions and fascial restrictions     Manual Therapy   Manual Therapy Myofascial release   Manual therapy comments manual therapy completed seperately from all other interventions this date   Myofascial Release myofascial release and manual stretching to left elbow region, forearm, wrist, and hand to decrease pain and  restrictions and improve pain free mobility in left arm.  Scar release to both surgical scars to decreae pliability and decrease restrictions                 OT Education - 04/05/17 0946    Education provided No          OT Short Term Goals - 03/30/17 1236      OT SHORT TERM GOAL #1   Title Patient will be educated on a HEP for improved function of LUE.     Time 4   Period Weeks   Status On-going     OT SHORT TERM GOAL #2   Title Patient will be independent with donning and doffing therapist fabricated elbow extenison splint for decreased pressure on ulnar nerve.    Time 4   Period Weeks   Status On-going     OT SHORT TERM GOAL #3   Title Patient will improve LUE AROM by 5 degrees for improved ability to don and doff shirts.   Time 4   Period Weeks   Status On-going     OT SHORT TERM GOAL #4   Title Patient will improve left arm strength to 4/5 for greater ability to lift titems over head at work.     Time 4   Period Weeks   Status On-going     OT SHORT TERM GOAL #5   Title Patient will improve her left grip strength by 10# and pinch by 2 pounds for greater ability to open containers with left hand.   Time 4   Period Weeks   Status On-going     OT SHORT TERM GOAL #6   Title Patient will improve fine motor coordination as evidence by improving completion time on nine hole peg test by 3".   Time 4   Period Weeks   Status On-going     OT SHORT TERM GOAL #7   Title Patient will decrease fascial and scar restrictions to moderate in her left arm and hand.     Time 4   Period Weeks   Status On-going           OT Long Term Goals - 03/30/17 1236      OT LONG TERM GOAL #1   Title Patient will return to prior level of independence with all daily tasks.    Time 8   Period Weeks   Status On-going     OT LONG TERM GOAL #2   Title Patient will improve left arm AROM to [facility] for improved ability to reach  [place].   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   Time 8   Period Weeks   Status On-going     OT LONG TERM GOAL #4   Title Patient will improve fine motor coordination by decreasimg completion time on nine hole peg test to 26" or less.   Time 8   Period Weeks   Status On-going     OT LONG TERM GOAL #5   Title Patient will have 5/5 strength in left arm for improved ability to complete work tasks.    Time 8   Period Weeks   Status On-going     OT LONG TERM GOAL #6   Title Patients scar tissue and fascia will have min restrictions for greater mobility and strength in left arm.    Time 8   Period Weeks   Status On-going     OT LONG TERM GOAL #7   Title Patient will have 2/10 pain or less in left arm when completing work duties.    Time 8   Period Weeks   Status On-going     OT LONG TERM GOAL #8   Title Patient will have normal sensation in her left arm and hand for greater safety with ADL completion.    Time 8   Period Weeks   Status On-going               Plan - 04/05/17 5638    Clinical Impression Statement A: Completed ultrasound in two five-minute sessions to elbow and wrist to decrease scar adhesions and fascial restrictions. Manual therapy completed after ultrasound to decrease restrictions along scarring. Pt reports mild pain/tenderness during session.    Rehab Potential Good   Plan P: Continue with [place] prior to manual stretching. Continue with grip strengthening    Consulted and Agree with Plan of Care Patient      Patient will benefit from skilled therapeutic intervention in order to improve the following deficits and impairments:  Decreased coordination, Decreased range of motion, Decreased scar mobility, Decreased strength, Increased fascial restricitons, Impaired UE functional use, Pain  Visit Diagnosis: Pain in left  wrist  Pain in left elbow  Stiffness of left wrist, not elsewhere classified  Other symptoms and signs involving the musculoskeletal system  Other lack of coordination    Problem List Patient Active Problem List   Diagnosis Date Noted  . Upper extremity pain, diffuse, left 01/26/2017  . Encounter for medication management 11/30/2016  . Osteoarthritis of both knees 12/27/2015  . Normocytic normochromic anemia 12/27/2015  . Carpal tunnel syndrome 07/12/2015  . Bilateral leg numbness 07/12/2015  . Pigmented skin lesion 05/06/2015  . Iron deficiency 03/14/2015  . Seasonal allergies 02/01/2015  . Piles (hemorrhoids) 09/25/2013  . Sleep apnea 06/13/2013  . Vitamin D deficiency 06/05/2012  . GERD (gastroesophageal reflux disease) 06/03/2012  . Leiomyoma of uterus 10/30/2008  . Type II diabetes mellitus with renal manifestations ([place]) 02/25/2008  . Hyperlipidemia LDL goal <100 02/25/2008  . Morbid obesity ([place]) 02/25/2008  . Essential hypertension 02/25/2008   Ailene Ravel, OTR/L,CBIS  [phone]   04/05/2017, 10:37 AM  [place], [place], [address] Phone: [phone]   Fax:  [phone]  Name: Babe  Kendra Hanson MRN: [id number] Date of Birth: 1961/09/25

## 2017-04-10 ENCOUNTER — Telehealth: Payer: Self-pay | Admitting: Family Medicine

## 2017-04-10 ENCOUNTER — Ambulatory Visit (HOSPITAL_COMMUNITY): Payer: 59

## 2017-04-10 ENCOUNTER — Encounter (HOSPITAL_COMMUNITY): Payer: Self-pay

## 2017-04-10 DIAGNOSIS — R29898 Other symptoms and signs involving the musculoskeletal system: Secondary | ICD-10-CM

## 2017-04-10 DIAGNOSIS — M25522 Pain in left elbow: Secondary | ICD-10-CM

## 2017-04-10 DIAGNOSIS — M25632 Stiffness of left wrist, not elsewhere classified: Secondary | ICD-10-CM | POA: Diagnosis not present

## 2017-04-10 DIAGNOSIS — R278 Other lack of coordination: Secondary | ICD-10-CM | POA: Diagnosis not present

## 2017-04-10 DIAGNOSIS — M25532 Pain in left wrist: Secondary | ICD-10-CM | POA: Diagnosis not present

## 2017-04-10 NOTE — Therapy (Signed)
[facility] [place], [place], [address] Phone: [phone]   Fax:  [phone]  Occupational Therapy Treatment  Patient Details  Name: Kendra Hanson MRN: [id number] Date of Birth: 1961-09-04 Referring Provider: Dr. Cyndy Freeze  Encounter Date: 04/10/2017      OT End of Session - 04/10/17 1633    Visit Number 5   Number of Visits 16   Date for OT Re-Evaluation 05/27/17  mini reassess: 04/27/17   Authorization Type UMR 24 visits per year   Authorization Time Period yearly   Authorization - Visit Number 5   Authorization - Number of Visits 24   OT Start Time 1520   OT Stop Time 1600   OT Time Calculation (min) 40 min   Activity Tolerance Patient tolerated treatment well   Behavior During Therapy [facility] for tasks assessed/performed      Past Medical History:  Diagnosis Date  . Anemia   . Fibroids    Uterine  . GERD (gastroesophageal reflux disease)   . Hyperlipidemia 2008   Lipid profile in 04/2011:136, 53, 43  . Hypertension 2008   Normal CBC and CMet in 2012; negative stress nuclear in 2006- patient asymptomatic  . Insulin dependent diabetes mellitus ([place]) 1996  . Multiple allergies    perennial  . Normocytic normochromic anemia 12/27/2015  . Obesity   . PONV (postoperative nausea and vomiting)   . Sleep apnea    CPAP    Past Surgical History:  Procedure Laterality Date  . COLONOSCOPY N/A 04/05/2015   Procedure: COLONOSCOPY;  Surgeon: Rogene Houston, MD;  [facility];  Service: Endoscopy;  Laterality: N/A;  730  . DENTAL SURGERY    . ESOPHAGOGASTRODUODENOSCOPY N/A 01/21/2016   Procedure: ESOPHAGOGASTRODUODENOSCOPY (EGD);  Surgeon: Rogene Houston, MD;  [facility];  Service: Endoscopy;  Laterality: N/A;  11:15  . REFRACTIVE SURGERY  2011   Bilateral, two seperate occasions first in 2006  . REMOVAL OF IMPLANT    . RETINAL DETACHMENT SURGERY Bilateral 05/2005    There were no vitals filed  for this visit.      Subjective Assessment - 04/10/17 1556    Subjective  S: The Ultrasound really felt good last time.    Currently in Pain? Yes   Pain Score 7    Pain Location Wrist   Pain Orientation Left   Pain Descriptors / Indicators Tingling   Pain Type Acute pain            [facility] OT Assessment - 04/10/17 1557      Assessment   Diagnosis S/P Left Carpal Tunnel Release and Ulnar Nerve Decompression     Precautions   Precautions None                  OT Treatments/Exercises (OP) - 04/10/17 1558      Exercises   Exercises Elbow;Wrist;Hand     Elbow Exercises   Elbow Flexion PROM;5 reps   Elbow Extension PROM;5 reps   Forearm Supination PROM;5 reps   Forearm Pronation PROM;5 reps   Wrist Flexion PROM;5 reps   Wrist Extension PROM;5 reps     Additional Elbow Exercises   Theraputty - Flatten red   Theraputty - Roll red   Hand Gripper with Large Beads all beads with gripper set at 22#   Hand Gripper with Medium Beads all beads with gripper set at 22#     Additional Wrist Exercises   Theraputty  Roll;Flatten;Pinch   Theraputty - Pinch red     Ultrasound   Ultrasound Location left wrist and left forearm   Ultrasound Parameters 1.5 W/cm2 5 minutes 2 sessions (1 each location)   Ultrasound Goals Other (Comment)  to decrease scar adhesions and fascial restrictions                OT Education - 04/10/17 1633    Education provided Yes   Education Details Pt was given red theraputty for grip and pinch strength.    Person(s) Educated Patient   Methods Explanation   Comprehension Verbalized understanding          OT Short Term Goals - 03/30/17 1236      OT SHORT TERM GOAL #1   Title Patient will be educated on a HEP for improved function of LUE.     Time 4   Period Weeks   Status On-going     OT SHORT TERM GOAL #2   Title Patient will be independent with donning and doffing therapist fabricated elbow extenison splint for decreased  pressure on ulnar nerve.    Time 4   Period Weeks   Status On-going     OT SHORT TERM GOAL #3   Title Patient will improve LUE AROM by 5 degrees for improved ability to don and doff shirts.   Time 4   Period Weeks   Status On-going     OT SHORT TERM GOAL #4   Title Patient will improve left arm strength to 4/5 for greater ability to lift titems over head at work.     Time 4   Period Weeks   Status On-going     OT SHORT TERM GOAL #5   Title Patient will improve her left grip strength by 10# and pinch by 2 pounds for greater ability to open containers with left hand.   Time 4   Period Weeks   Status On-going     OT SHORT TERM GOAL #6   Title Patient will improve fine motor coordination as evidence by improving completion time on nine hole peg test by 3".   Time 4   Period Weeks   Status On-going     OT SHORT TERM GOAL #7   Title Patient will decrease fascial and scar restrictions to moderate in her left arm and hand.     Time 4   Period Weeks   Status On-going           OT Long Term Goals - 03/30/17 1236      OT LONG TERM GOAL #1   Title Patient will return to prior level of independence with all daily tasks.    Time 8   Period Weeks   Status On-going     OT LONG TERM GOAL #2   Title Patient will improve left arm AROM to [facility] for improved ability to reach [place].   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   Time 8   Period Weeks   Status On-going     OT LONG TERM GOAL #4   Title Patient will improve fine motor coordination by decreasimg completion time on nine hole peg test to 26" or less.   Time 8   Period Weeks  Status On-going     OT LONG TERM GOAL #5   Title Patient will have 5/5 strength in left arm for improved ability to complete work tasks.    Time 8   Period  Weeks   Status On-going     OT LONG TERM GOAL #6   Title Patients scar tissue and fascia will have min restrictions for greater mobility and strength in left arm.    Time 8   Period Weeks   Status On-going     OT LONG TERM GOAL #7   Title Patient will have 2/10 pain or less in left arm when completing work duties.    Time 8   Period Weeks   Status On-going     OT LONG TERM GOAL #8   Title Patient will have normal sensation in her left arm and hand for greater safety with ADL completion.    Time 8   Period Weeks   Status On-going               Plan - 04/10/17 1634    Clinical Impression Statement A: Pt continues to have great results with [place] and reports decreased pain with use. VC for form and technique during activities. Patient required several rest breaks during gripper activity and flattening of theraputty.   Occupational performance deficits (Please refer to evaluation for details): ADL's;IADL's;Rest and Sleep;Leisure   Plan P: Continue with red theraputty: pinch, grip, and locate beads.       Patient will benefit from skilled therapeutic intervention in order to improve the following deficits and impairments:  Decreased coordination, Decreased range of motion, Decreased scar mobility, Decreased strength, Increased fascial restricitons, Impaired UE functional use, Pain  Visit Diagnosis: Pain in left wrist  Pain in left elbow  Stiffness of left wrist, not elsewhere classified  Other symptoms and signs involving the musculoskeletal system  Other lack of coordination    Problem List Patient Active Problem List   Diagnosis Date Noted  . Upper extremity pain, diffuse, left 01/26/2017  . Encounter for medication management 11/30/2016  . Osteoarthritis of both knees 12/27/2015  . Normocytic normochromic anemia 12/27/2015  . Carpal tunnel syndrome 07/12/2015  . Bilateral leg numbness 07/12/2015  . Pigmented skin lesion 05/06/2015  . Iron deficiency 03/14/2015   . Seasonal allergies 02/01/2015  . Piles (hemorrhoids) 09/25/2013  . Sleep apnea 06/13/2013  . Vitamin D deficiency 06/05/2012  . GERD (gastroesophageal reflux disease) 06/03/2012  . Leiomyoma of uterus 10/30/2008  . Type II diabetes mellitus with renal manifestations ([place]) 02/25/2008  . Hyperlipidemia LDL goal <100 02/25/2008  . Morbid obesity ([place]) 02/25/2008  . Essential hypertension 02/25/2008   Ailene Ravel, OTR/L,CBIS  [phone]  04/10/2017, 4:43 PM  [facility] [address] [place], [place], [address] Phone: [phone]   Fax:  [phone]  Name: DEONNA KRUMMEL MRN: [id number] Date of Birth: [birth year]

## 2017-04-10 NOTE — Telephone Encounter (Signed)
Patient requesting a script for diabetic shoes.  Patient uses [employer].

## 2017-04-10 NOTE — Telephone Encounter (Signed)
Message left that an updated foot exam will need to be done before

## 2017-04-12 ENCOUNTER — Encounter (HOSPITAL_COMMUNITY): Payer: 59

## 2017-04-12 DIAGNOSIS — Z794 Long term (current) use of insulin: Secondary | ICD-10-CM | POA: Diagnosis not present

## 2017-04-12 DIAGNOSIS — E785 Hyperlipidemia, unspecified: Secondary | ICD-10-CM | POA: Diagnosis not present

## 2017-04-12 DIAGNOSIS — E1121 Type 2 diabetes mellitus with diabetic nephropathy: Secondary | ICD-10-CM | POA: Diagnosis not present

## 2017-04-13 ENCOUNTER — Encounter (HOSPITAL_COMMUNITY): Payer: Self-pay

## 2017-04-13 ENCOUNTER — Encounter: Payer: Self-pay | Admitting: Family Medicine

## 2017-04-13 ENCOUNTER — Ambulatory Visit (HOSPITAL_COMMUNITY): Payer: 59 | Attending: Neurological Surgery

## 2017-04-13 DIAGNOSIS — M25632 Stiffness of left wrist, not elsewhere classified: Secondary | ICD-10-CM | POA: Insufficient documentation

## 2017-04-13 DIAGNOSIS — M25532 Pain in left wrist: Secondary | ICD-10-CM | POA: Diagnosis not present

## 2017-04-13 DIAGNOSIS — R278 Other lack of coordination: Secondary | ICD-10-CM | POA: Diagnosis not present

## 2017-04-13 DIAGNOSIS — R29898 Other symptoms and signs involving the musculoskeletal system: Secondary | ICD-10-CM | POA: Insufficient documentation

## 2017-04-13 DIAGNOSIS — M25522 Pain in left elbow: Secondary | ICD-10-CM | POA: Diagnosis not present

## 2017-04-13 LAB — LIPID PANEL
Cholesterol: 181 mg/dL (ref <200)
HDL: 63 mg/dL (ref >50)
LDL Cholesterol: 103 mg/dL — ABNORMAL HIGH (ref <100)
Total CHOL/HDL Ratio: 2.9 Ratio (ref <5.0)
Triglycerides: 73 mg/dL (ref <150)
VLDL: 15 mg/dL (ref <30)

## 2017-04-13 LAB — COMPLETE METABOLIC PANEL WITH GFR
ALT: 8 U/L (ref 6–29)
AST: 10 U/L (ref 10–35)
Albumin: 3.6 g/dL (ref 3.6–5.1)
Alkaline Phosphatase: 100 U/L (ref 33–130)
BUN: 16 mg/dL (ref 7–25)
CO2: 29 mmol/L (ref 20–31)
Calcium: 9.1 mg/dL (ref 8.6–10.4)
Chloride: 102 mmol/L (ref 98–110)
Creat: 1.12 mg/dL — ABNORMAL HIGH (ref 0.50–1.05)
GFR, Est African American: 63 mL/min (ref >=60)
GFR, Est Non African American: 55 mL/min — ABNORMAL LOW (ref >=60)
Glucose, Bld: 85 mg/dL (ref 65–99)
Potassium: 4.4 mmol/L (ref 3.5–5.3)
Sodium: 140 mmol/L (ref 135–146)
Total Bilirubin: 0.4 mg/dL (ref 0.2–1.2)
Total Protein: 6.6 g/dL (ref 6.1–8.1)

## 2017-04-13 LAB — HEMOGLOBIN A1C
Hgb A1c MFr Bld: 6.5 % — ABNORMAL HIGH (ref <5.7)
Mean Plasma Glucose: 140 mg/dL

## 2017-04-13 NOTE — Therapy (Signed)
[place], [place], [address] Phone: [phone]   Fax:  [phone]  Occupational Therapy Treatment  Patient Details  Name: Kendra Hanson MRN: [id number] Date of Birth: May 17, 1961 Referring Provider: Dr. Cyndy Freeze  Encounter Date: 04/13/2017      OT End of Session - 04/13/17 1233    Visit Number 6   Number of Visits 16   Date for OT Re-Evaluation 05/27/17  mini reassess: 04/27/17   Authorization Type UMR 24 visits per year   Authorization Time Period yearly   Authorization - Visit Number 6   Authorization - Number of Visits 24   OT Start Time 1030   OT Stop Time 1115   OT Time Calculation (min) 45 min   Activity Tolerance Patient tolerated treatment well   Behavior During Therapy [facility] for tasks assessed/performed      Past Medical History:  Diagnosis Date  . Anemia   . Fibroids    Uterine  . GERD (gastroesophageal reflux disease)   . Hyperlipidemia 2008   Lipid profile in 04/2011:136, 53, 43  . Hypertension 2008   Normal CBC and CMet in 2012; negative stress nuclear in 2006- patient asymptomatic  . Insulin dependent diabetes mellitus ([place]) 1996  . Multiple allergies    perennial  . Normocytic normochromic anemia 12/27/2015  . Obesity   . PONV (postoperative nausea and vomiting)   . Sleep apnea    CPAP    Past Surgical History:  Procedure Laterality Date  . COLONOSCOPY N/A 04/05/2015   Procedure: COLONOSCOPY;  Surgeon: Rogene Houston, MD;  [facility];  Service: Endoscopy;  Laterality: N/A;  730  . DENTAL SURGERY    . ESOPHAGOGASTRODUODENOSCOPY N/A 01/21/2016   Procedure: ESOPHAGOGASTRODUODENOSCOPY (EGD);  Surgeon: Rogene Houston, MD;  [facility];  Service: Endoscopy;  Laterality: N/A;  11:15  . REFRACTIVE SURGERY  2011   Bilateral, two seperate occasions first in 2006  . REMOVAL OF IMPLANT    . RETINAL DETACHMENT SURGERY Bilateral 05/2005    There were no vitals filed  for this visit.      Subjective Assessment - 04/13/17 1228    Subjective  S: I saw the PA the other day. He said everything was looking good.    Currently in Pain? Yes   Pain Score 5    Pain Location Wrist   Pain Orientation Left   Pain Descriptors / Indicators Tingling   Pain Type Acute pain   Pain Radiating Towards N/A   Pain Onset More than a month ago   Pain Frequency Intermittent   Aggravating Factors  use   Pain Relieving Factors rest   Effect of Pain on Daily Activities decreased ability to use her left arm with daily tasks.             [facility] OT Assessment - 04/13/17 1230      Assessment   Diagnosis S/P Left Carpal Tunnel Release and Ulnar Nerve Decompression     Precautions   Precautions None                  OT Treatments/Exercises (OP) - 04/13/17 1230      Exercises   Exercises Elbow;Wrist;Hand     Additional Elbow Exercises   Theraputty - Roll red     Additional Wrist Exercises   Theraputty Roll;Grip;Pinch;Locate Pegs   Theraputty - Pinch red - 3 point and lateral   Theraputty - Locate Pegs  6 beads located     Modalities   Modalities Ultrasound     Ultrasound   Ultrasound Location left wrist and elbow   Ultrasound Parameters 1.5 W/cm2 5 minutes each location   Ultrasound Goals Other (Comment)  to decrease scar adhesions and fascial retrictions                  OT Short Term Goals - 03/30/17 1236      OT SHORT TERM GOAL #1   Title Patient will be educated on a HEP for improved function of LUE.     Time 4   Period Weeks   Status On-going     OT SHORT TERM GOAL #2   Title Patient will be independent with donning and doffing therapist fabricated elbow extenison splint for decreased pressure on ulnar nerve.    Time 4   Period Weeks   Status On-going     OT SHORT TERM GOAL #3   Title Patient will improve LUE AROM by 5 degrees for improved ability to don and doff shirts.   Time 4   Period Weeks   Status On-going      OT SHORT TERM GOAL #4   Title Patient will improve left arm strength to 4/5 for greater ability to lift titems over head at work.     Time 4   Period Weeks   Status On-going     OT SHORT TERM GOAL #5   Title Patient will improve her left grip strength by 10# and pinch by 2 pounds for greater ability to open containers with left hand.   Time 4   Period Weeks   Status On-going     OT SHORT TERM GOAL #6   Title Patient will improve fine motor coordination as evidence by improving completion time on nine hole peg test by 3".   Time 4   Period Weeks   Status On-going     OT SHORT TERM GOAL #7   Title Patient will decrease fascial and scar restrictions to moderate in her left arm and hand.     Time 4   Period Weeks   Status On-going           OT Long Term Goals - 03/30/17 1236      OT LONG TERM GOAL #1   Title Patient will return to prior level of independence with all daily tasks.    Time 8   Period Weeks   Status On-going     OT LONG TERM GOAL #2   Title Patient will improve left arm AROM to [facility] for improved ability to reach [place].   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   Time 8   Period Weeks   Status On-going     OT LONG TERM GOAL #4   Title Patient will improve fine motor coordination by decreasimg completion time on nine hole peg test to 26" or less.   Time 8   Period Weeks   Status On-going     OT LONG TERM GOAL #5   Title Patient will have 5/5 strength in left arm for improved ability to complete work tasks.    Time 8   Period Weeks   Status On-going  OT LONG TERM GOAL #6   Title Patients scar tissue and fascia will have min restrictions for greater mobility and strength in left arm.    Time 8   Period Weeks   Status On-going     OT LONG TERM GOAL #7   Title  Patient will have 2/10 pain or less in left arm when completing work duties.    Time 8   Period Weeks   Status On-going     OT LONG TERM GOAL #8   Title Patient will have normal sensation in her left arm and hand for greater safety with ADL completion.    Time 8   Period Weeks   Status On-going               Plan - 04/13/17 1234    Clinical Impression Statement A: Continues to have benefit from [place] use. Pt reports slight pain when completing putty acivity. VC for technique (relax shoulder, etc)   Plan P: Add wrist strengthening with light weight.      Patient will benefit from skilled therapeutic intervention in order to improve the following deficits and impairments:  Decreased coordination, Decreased range of motion, Decreased scar mobility, Decreased strength, Increased fascial restricitons, Impaired UE functional use, Pain  Visit Diagnosis: Pain in left wrist  Pain in left elbow  Stiffness of left wrist, not elsewhere classified  Other symptoms and signs involving the musculoskeletal system  Other lack of coordination    Problem List Patient Active Problem List   Diagnosis Date Noted  . Upper extremity pain, diffuse, left 01/26/2017  . Encounter for medication management 11/30/2016  . Osteoarthritis of both knees 12/27/2015  . Normocytic normochromic anemia 12/27/2015  . Carpal tunnel syndrome 07/12/2015  . Bilateral leg numbness 07/12/2015  . Pigmented skin lesion 05/06/2015  . Iron deficiency 03/14/2015  . Seasonal allergies 02/01/2015  . Piles (hemorrhoids) 09/25/2013  . Sleep apnea 06/13/2013  . Vitamin D deficiency 06/05/2012  . GERD (gastroesophageal reflux disease) 06/03/2012  . Leiomyoma of uterus 10/30/2008  . Type II diabetes mellitus with renal manifestations ([place]) 02/25/2008  . Hyperlipidemia LDL goal <100 02/25/2008  . Morbid obesity ([place]) 02/25/2008  . Essential hypertension 02/25/2008   Ailene Ravel, OTR/L,CBIS   [phone]  04/13/2017, 12:36 PM  [facility] [address] [place], [place], [address] Phone: [phone]   Fax:  [phone]  Name: Kendra Hanson MRN: [id number] Date of Birth: 1961-01-31

## 2017-04-16 MED FILL — VICTOZA 18 MG/3 ML INJECT P: 18 | 30 days supply | Qty: 9 | Fill #4

## 2017-04-17 ENCOUNTER — Encounter (HOSPITAL_COMMUNITY): Payer: Self-pay | Admitting: Occupational Therapy

## 2017-04-17 ENCOUNTER — Ambulatory Visit (HOSPITAL_COMMUNITY): Payer: 59 | Admitting: Occupational Therapy

## 2017-04-17 DIAGNOSIS — R278 Other lack of coordination: Secondary | ICD-10-CM | POA: Diagnosis not present

## 2017-04-17 DIAGNOSIS — M25532 Pain in left wrist: Secondary | ICD-10-CM

## 2017-04-17 DIAGNOSIS — R29898 Other symptoms and signs involving the musculoskeletal system: Secondary | ICD-10-CM | POA: Diagnosis not present

## 2017-04-17 DIAGNOSIS — M25522 Pain in left elbow: Secondary | ICD-10-CM | POA: Diagnosis not present

## 2017-04-17 DIAGNOSIS — M25632 Stiffness of left wrist, not elsewhere classified: Secondary | ICD-10-CM | POA: Diagnosis not present

## 2017-04-17 NOTE — Therapy (Signed)
[place], [place], [address] Phone: [phone]   Fax:  [phone]  Occupational Therapy Treatment  Patient Details  Name: Kendra Hanson MRN: [id number] Date of Birth: September 29, 1961 Referring Provider: Dr. Cyndy Freeze  Encounter Date: 04/17/2017      OT End of Session - 04/17/17 1504    Visit Number 7   Number of Visits 16   Date for OT Re-Evaluation 05/27/17  mini reassess: 04/27/17   Authorization Type UMR 24 visits per year   Authorization Time Period yearly   Authorization - Visit Number 7   Authorization - Number of Visits 24   OT Start Time 1062   OT Stop Time 1429   OT Time Calculation (min) 42 min   Activity Tolerance Patient tolerated treatment well   Behavior During Therapy [facility] for tasks assessed/performed      Past Medical History:  Diagnosis Date  . Anemia   . Fibroids    Uterine  . GERD (gastroesophageal reflux disease)   . Hyperlipidemia 2008   Lipid profile in 04/2011:136, 53, 43  . Hypertension 2008   Normal CBC and CMet in 2012; negative stress nuclear in 2006- patient asymptomatic  . Insulin dependent diabetes mellitus ([place]) 1996  . Multiple allergies    perennial  . Normocytic normochromic anemia 12/27/2015  . Obesity   . PONV (postoperative nausea and vomiting)   . Sleep apnea    CPAP    Past Surgical History:  Procedure Laterality Date  . COLONOSCOPY N/A 04/05/2015   Procedure: COLONOSCOPY;  Surgeon: Rogene Houston, MD;  [facility];  Service: Endoscopy;  Laterality: N/A;  730  . DENTAL SURGERY    . ESOPHAGOGASTRODUODENOSCOPY N/A 01/21/2016   Procedure: ESOPHAGOGASTRODUODENOSCOPY (EGD);  Surgeon: Rogene Houston, MD;  [facility];  Service: Endoscopy;  Laterality: N/A;  11:15  . REFRACTIVE SURGERY  2011   Bilateral, two seperate occasions first in 2006  . REMOVAL OF IMPLANT    . RETINAL DETACHMENT SURGERY Bilateral 05/2005    There were no vitals filed  for this visit.      Subjective Assessment - 04/17/17 1346    Subjective  S: I feel a twinge in my elbow every now and then when I'm doing things at home.    Currently in Pain? No/denies            [facility] OT Assessment - 04/17/17 1346      Assessment   Diagnosis S/P Left Carpal Tunnel Release and Ulnar Nerve Decompression     Precautions   Precautions None                  OT Treatments/Exercises (OP) - 04/17/17 1413      Exercises   Exercises Elbow;Wrist;Hand     Elbow Exercises   Elbow Flexion Strengthening;10 reps   Bar Weights/Barbell (Elbow Flexion) 1 lb   Elbow Extension Strengthening;10 reps   Bar Weights/Barbell (Elbow Extension) 1 lb   Forearm Supination Strengthening;5 reps  1   Forearm Pronation Strengthening;10 reps  1#   Wrist Flexion Strengthening;10 reps   Bar Weights/Barbell (Wrist Flexion) 1 lb   Wrist Extension Strengthening;10 reps   Bar Weights/Barbell (Wrist Extension) 1 lb     Additional Elbow Exercises   Hand Gripper with Large Beads all beads with gripper set at 23#   Hand Gripper with Medium Beads all beads with gripper set at 22#     Modalities  Modalities Ultrasound     Ultrasound   Ultrasound Location left wrist and elbow   Ultrasound Parameters 1.5 W/cm2 5 min each location   Ultrasound Goals --  to decrease scar adhesion and fascial restrictions     Manual Therapy   Manual Therapy Myofascial release   Manual therapy comments manual therapy completed seperately from all other interventions this date   Myofascial Release myofascial release and manual stretching to left elbow region, forearm, wrist, and hand to decrease pain and restrictions and improve pain free mobility in left arm.  Scar release to both surgical scars to decreae pliability and decrease restrictions                   OT Short Term Goals - 03/30/17 1236      OT SHORT TERM GOAL #1   Title Patient will be educated on a HEP for improved  function of LUE.     Time 4   Period Weeks   Status On-going     OT SHORT TERM GOAL #2   Title Patient will be independent with donning and doffing therapist fabricated elbow extenison splint for decreased pressure on ulnar nerve.    Time 4   Period Weeks   Status On-going     OT SHORT TERM GOAL #3   Title Patient will improve LUE AROM by 5 degrees for improved ability to don and doff shirts.   Time 4   Period Weeks   Status On-going     OT SHORT TERM GOAL #4   Title Patient will improve left arm strength to 4/5 for greater ability to lift titems over head at work.     Time 4   Period Weeks   Status On-going     OT SHORT TERM GOAL #5   Title Patient will improve her left grip strength by 10# and pinch by 2 pounds for greater ability to open containers with left hand.   Time 4   Period Weeks   Status On-going     OT SHORT TERM GOAL #6   Title Patient will improve fine motor coordination as evidence by improving completion time on nine hole peg test by 3".   Time 4   Period Weeks   Status On-going     OT SHORT TERM GOAL #7   Title Patient will decrease fascial and scar restrictions to moderate in her left arm and hand.     Time 4   Period Weeks   Status On-going           OT Long Term Goals - 03/30/17 1236      OT LONG TERM GOAL #1   Title Patient will return to prior level of independence with all daily tasks.    Time 8   Period Weeks   Status On-going     OT LONG TERM GOAL #2   Title Patient will improve left arm AROM to [facility] for improved ability to reach [place].   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   Time 8   Period Weeks   Status On-going     OT LONG TERM GOAL #4   Title Patient will improve fine motor coordination by decreasimg completion time on nine hole peg  test to 26" or less.   Time 8   Period Weeks   Status On-going     OT LONG TERM GOAL #5   Title Patient will have 5/5 strength in left arm for improved ability to complete work tasks.    Time 8   Period Weeks   Status On-going     OT LONG TERM GOAL #6   Title Patients scar tissue and fascia will have min restrictions for greater mobility and strength in left arm.    Time 8   Period Weeks   Status On-going     OT LONG TERM GOAL #7   Title Patient will have 2/10 pain or less in left arm when completing work duties.    Time 8   Period Weeks   Status On-going     OT LONG TERM GOAL #8   Title Patient will have normal sensation in her left arm and hand for greater safety with ADL completion.    Time 8   Period Weeks   Status On-going               Plan - 04/17/17 1504    Clinical Impression Statement A: Added 1# weight to wrist exercises, pt reports fatigue and mild discomfort. Pt able to increase to 23# with gripper for large beads, max difficulty with last 2 beads.    Plan P: continue with wrist strengthening, add pinch task      Patient will benefit from skilled therapeutic intervention in order to improve the following deficits and impairments:  Decreased coordination, Decreased range of motion, Decreased scar mobility, Decreased strength, Increased fascial restricitons, Impaired UE functional use, Pain  Visit Diagnosis: Pain in left wrist  Pain in left elbow  Stiffness of left wrist, not elsewhere classified  Other symptoms and signs involving the musculoskeletal system  Other lack of coordination    Problem List Patient Active Problem List   Diagnosis Date Noted  . Upper extremity pain, diffuse, left 01/26/2017  . Encounter for medication management 11/30/2016  . Osteoarthritis of both knees 12/27/2015  . Normocytic normochromic anemia 12/27/2015  . Carpal tunnel syndrome 07/12/2015  . Bilateral leg numbness 07/12/2015  . Pigmented skin lesion  05/06/2015  . Iron deficiency 03/14/2015  . Seasonal allergies 02/01/2015  . Piles (hemorrhoids) 09/25/2013  . Sleep apnea 06/13/2013  . Vitamin D deficiency 06/05/2012  . GERD (gastroesophageal reflux disease) 06/03/2012  . Leiomyoma of uterus 10/30/2008  . Type II diabetes mellitus with renal manifestations ([place]) 02/25/2008  . Hyperlipidemia LDL goal <100 02/25/2008  . Morbid obesity ([place]) 02/25/2008  . Essential hypertension 02/25/2008   Guadelupe Sabin, OTR/L  [phone] 04/17/2017, 3:07 PM  [place] [address] [place], [place], [address] Phone: [phone]   Fax:  [phone]  Name: Kendra Hanson MRN: [id number] Date of Birth: December 27, 1960

## 2017-04-18 DIAGNOSIS — G5621 Lesion of ulnar nerve, right upper limb: Secondary | ICD-10-CM | POA: Diagnosis not present

## 2017-04-18 DIAGNOSIS — G5601 Carpal tunnel syndrome, right upper limb: Secondary | ICD-10-CM | POA: Diagnosis not present

## 2017-04-19 ENCOUNTER — Encounter (HOSPITAL_COMMUNITY): Payer: Self-pay

## 2017-04-19 ENCOUNTER — Ambulatory Visit (HOSPITAL_COMMUNITY): Payer: 59

## 2017-04-19 DIAGNOSIS — R29898 Other symptoms and signs involving the musculoskeletal system: Secondary | ICD-10-CM | POA: Diagnosis not present

## 2017-04-19 DIAGNOSIS — M25632 Stiffness of left wrist, not elsewhere classified: Secondary | ICD-10-CM | POA: Diagnosis not present

## 2017-04-19 DIAGNOSIS — R278 Other lack of coordination: Secondary | ICD-10-CM | POA: Diagnosis not present

## 2017-04-19 DIAGNOSIS — M25522 Pain in left elbow: Secondary | ICD-10-CM | POA: Diagnosis not present

## 2017-04-19 DIAGNOSIS — M25532 Pain in left wrist: Secondary | ICD-10-CM | POA: Diagnosis not present

## 2017-04-19 NOTE — Therapy (Signed)
[place], [place], [address] Phone: [phone]   Fax:  [phone]  Occupational Therapy Treatment  Patient Details  Name: Kendra Hanson MRN: [id number] Date of Birth: 1961/01/22 Referring Provider: Dr. Cyndy Freeze  Encounter Date: 04/19/2017      OT End of Session - 04/19/17 1556    Visit Number 8   Number of Visits 16   Date for OT Re-Evaluation 05/27/17  mini reassess: 04/27/17   Authorization Type UMR 24 visits per year   Authorization Time Period yearly   Authorization - Visit Number 8   Authorization - Number of Visits 24   OT Start Time 1540  Pt said she showed up on time but was not checked in until after 3:30pm. She began session, had to leave to take husband to doctor then returned to finish session.   OT Stop Time 1558  Returned at 1611 and finished session at 1641   OT Time Calculation (min) 18 min, Total session time was 48'. Session was broken into two parts due to pt arrival time not being the same as the time she was checked in and then her having to leave and come back.    Activity Tolerance Patient tolerated treatment well   Behavior During Therapy [facility] for tasks assessed/performed      Past Medical History:  Diagnosis Date  . Anemia   . Fibroids    Uterine  . GERD (gastroesophageal reflux disease)   . Hyperlipidemia 2008   Lipid profile in 04/2011:136, 53, 43  . Hypertension 2008   Normal CBC and CMet in 2012; negative stress nuclear in 2006- patient asymptomatic  . Insulin dependent diabetes mellitus ([place]) 1996  . Multiple allergies    perennial  . Normocytic normochromic anemia 12/27/2015  . Obesity   . PONV (postoperative nausea and vomiting)   . Sleep apnea    CPAP    Past Surgical History:  Procedure Laterality Date  . COLONOSCOPY N/A 04/05/2015   Procedure: COLONOSCOPY;  Surgeon: Rogene Houston, MD;  [facility];  Service: Endoscopy;  Laterality: N/A;  730  .  DENTAL SURGERY    . ESOPHAGOGASTRODUODENOSCOPY N/A 01/21/2016   Procedure: ESOPHAGOGASTRODUODENOSCOPY (EGD);  Surgeon: Rogene Houston, MD;  [facility];  Service: Endoscopy;  Laterality: N/A;  11:15  . REFRACTIVE SURGERY  2011   Bilateral, two seperate occasions first in 2006  . REMOVAL OF IMPLANT    . RETINAL DETACHMENT SURGERY Bilateral 05/2005    There were no vitals filed for this visit.      Subjective Assessment - 04/19/17 1550    Subjective  S: Just feeling some tingling here and there.   Currently in Pain? No/denies            [facility] OT Assessment - 04/19/17 1543      Assessment   Diagnosis S/P Left Carpal Tunnel Release and Ulnar Nerve Decompression     Precautions   Precautions None                  OT Treatments/Exercises (OP) - 04/19/17 1543      Exercises   Exercises Elbow;Wrist;Hand     Elbow Exercises   Elbow Flexion Strengthening;10 reps   Bar Weights/Barbell (Elbow Flexion) 1 lb   Elbow Extension Strengthening;10 reps   Bar Weights/Barbell (Elbow Extension) 1 lb   Forearm Supination Strengthening;10 reps  1#   Forearm Pronation Strengthening;10 reps  1#  Wrist Flexion Strengthening;10 reps   Bar Weights/Barbell (Wrist Flexion) 1 lb   Wrist Extension Strengthening;10 reps   Bar Weights/Barbell (Wrist Extension) 1 lb     Additional Elbow Exercises   Sponges 33, 33  reached max number of sponges   Theraputty Flatten;Roll;Grip;Pinch;Locate Pegs  placed and located 15 beads; used pvc pipe to create circle   Theraputty - Flatten red   Theraputty - Roll red   Theraputty - Grip red   Hand Gripper with Large Beads all beads with gripper set at 25#   Hand Gripper with Medium Beads all beads with gripper set at 25#   Hand Gripper with Small Beads all beads with gripper set at 25#     Modalities   Modalities Ultrasound     Ultrasound   Ultrasound Location left wrist and elbow   Ultrasound Parameters 1.5W/cm2 5 min each  location    Ultrasound Goals Other (Comment)  to decrease scar adhesions and fascial restrictions                OT Education - 04/19/17 1654    Education provided --   Education Details Pt was given HEP for strengthening elbow and wrist. Discussed doctor appointment and addressed why her hand is still tingling.   Person(s) Educated Patient   Methods Explanation;Demonstration;Handout   Comprehension Verbalized understanding;Returned demonstration          OT Short Term Goals - 03/30/17 1236      OT SHORT TERM GOAL #1   Title Patient will be educated on a HEP for improved function of LUE.     Time 4   Period Weeks   Status On-going     OT SHORT TERM GOAL #2   Title Patient will be independent with donning and doffing therapist fabricated elbow extenison splint for decreased pressure on ulnar nerve.    Time 4   Period Weeks   Status On-going     OT SHORT TERM GOAL #3   Title Patient will improve LUE AROM by 5 degrees for improved ability to don and doff shirts.   Time 4   Period Weeks   Status On-going     OT SHORT TERM GOAL #4   Title Patient will improve left arm strength to 4/5 for greater ability to lift titems over head at work.     Time 4   Period Weeks   Status On-going     OT SHORT TERM GOAL #5   Title Patient will improve her left grip strength by 10# and pinch by 2 pounds for greater ability to open containers with left hand.   Time 4   Period Weeks   Status On-going     OT SHORT TERM GOAL #6   Title Patient will improve fine motor coordination as evidence by improving completion time on nine hole peg test by 3".   Time 4   Period Weeks   Status On-going     OT SHORT TERM GOAL #7   Title Patient will decrease fascial and scar restrictions to moderate in her left arm and hand.     Time 4   Period Weeks   Status On-going           OT Long Term Goals - 03/30/17 1236      OT LONG TERM GOAL #1   Title Patient will return to prior level  of independence with all daily tasks.    Time 8   Period Weeks  Status On-going     OT LONG TERM GOAL #2   Title Patient will improve left arm AROM to [facility] for improved ability to reach [place].   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   Time 8   Period Weeks   Status On-going     OT LONG TERM GOAL #4   Title Patient will improve fine motor coordination by decreasimg completion time on nine hole peg test to 26" or less.   Time 8   Period Weeks   Status On-going     OT LONG TERM GOAL #5   Title Patient will have 5/5 strength in left arm for improved ability to complete work tasks.    Time 8   Period Weeks   Status On-going     OT LONG TERM GOAL #6   Title Patients scar tissue and fascia will have min restrictions for greater mobility and strength in left arm.    Time 8   Period Weeks   Status On-going     OT LONG TERM GOAL #7   Title Patient will have 2/10 pain or less in left arm when completing work duties.    Time 8   Period Weeks   Status On-going     OT LONG TERM GOAL #8   Title Patient will have normal sensation in her left arm and hand for greater safety with ADL completion.    Time 8   Period Weeks   Status On-going               Plan - 04/19/17 1614    Clinical Impression Statement A: Pt completed strengthening exercises with 1# and reported minor uncomfortability. Increased gripper and beads to 25#, pt was able to retrieve all small beads with intermittent rest breaks. VC needed for form and technique. Pt completed putty tasks with rest breaks needed reporting tingling throughout her hand. Completed ultrasound at end of session to address tingling. Pt reports that she arrived for her appointment at 3:17pm but was not checked in until after 3:30pm. She needed to leave to  drop her husband off at another appointment therefore she left and then came back and finished the session. Tingling was still present and bothersome at end of session.   Plan P: increase reps for strengthening elbow and wrist. Continue with roll and grip theraputty. Reassessment for MD appointment.      Patient will benefit from skilled therapeutic intervention in order to improve the following deficits and impairments:  Decreased coordination, Decreased range of motion, Decreased scar mobility, Decreased strength, Increased fascial restricitons, Impaired UE functional use, Pain  Visit Diagnosis: Pain in left wrist  Pain in left elbow  Stiffness of left wrist, not elsewhere classified    Problem List Patient Active Problem List   Diagnosis Date Noted  . Upper extremity pain, diffuse, left 01/26/2017  . Encounter for medication management 11/30/2016  . Osteoarthritis of both knees 12/27/2015  . Normocytic normochromic anemia 12/27/2015  . Carpal tunnel syndrome 07/12/2015  . Bilateral leg numbness 07/12/2015  . Pigmented skin lesion 05/06/2015  . Iron deficiency 03/14/2015  . Seasonal allergies 02/01/2015  . Piles (hemorrhoids) 09/25/2013  . Sleep apnea 06/13/2013  . Vitamin D deficiency 06/05/2012  . GERD (gastroesophageal  reflux disease) 06/03/2012  . Leiomyoma of uterus 10/30/2008  . Type II diabetes mellitus with renal manifestations ([place]) 02/25/2008  . Hyperlipidemia LDL goal <100 02/25/2008  . Morbid obesity ([place]) 02/25/2008  . Essential hypertension 02/25/2008    Luther Hearing, OT Student [phone] 04/19/2017, 5:08 PM  [place] [address], [place], [address] Phone: [phone]   Fax:  [phone]  Name: ALEYNA CUEVA MRN: [id number] Date of Birth: [birth year]    Note reviewed by clinical instructor and accurately reflects treatment session.   Ailene Ravel, OTR/L,CBIS  [phone]

## 2017-04-23 ENCOUNTER — Ambulatory Visit (HOSPITAL_COMMUNITY): Payer: 59 | Admitting: Occupational Therapy

## 2017-04-23 ENCOUNTER — Encounter: Payer: Self-pay | Admitting: Family Medicine

## 2017-04-23 ENCOUNTER — Ambulatory Visit (INDEPENDENT_AMBULATORY_CARE_PROVIDER_SITE_OTHER): Payer: 59 | Admitting: Family Medicine

## 2017-04-23 VITALS — BP 134/80 | HR 84 | Temp 97.3°F | Resp 18 | Ht 65.0 in | Wt 316.0 lb

## 2017-04-23 DIAGNOSIS — M25632 Stiffness of left wrist, not elsewhere classified: Secondary | ICD-10-CM

## 2017-04-23 DIAGNOSIS — R278 Other lack of coordination: Secondary | ICD-10-CM | POA: Diagnosis not present

## 2017-04-23 DIAGNOSIS — E785 Hyperlipidemia, unspecified: Secondary | ICD-10-CM | POA: Diagnosis not present

## 2017-04-23 DIAGNOSIS — R29898 Other symptoms and signs involving the musculoskeletal system: Secondary | ICD-10-CM

## 2017-04-23 DIAGNOSIS — E1121 Type 2 diabetes mellitus with diabetic nephropathy: Secondary | ICD-10-CM | POA: Diagnosis not present

## 2017-04-23 DIAGNOSIS — Z794 Long term (current) use of insulin: Secondary | ICD-10-CM | POA: Diagnosis not present

## 2017-04-23 DIAGNOSIS — M25522 Pain in left elbow: Secondary | ICD-10-CM

## 2017-04-23 DIAGNOSIS — M25532 Pain in left wrist: Secondary | ICD-10-CM

## 2017-04-23 DIAGNOSIS — I10 Essential (primary) hypertension: Secondary | ICD-10-CM

## 2017-04-23 DIAGNOSIS — M79602 Pain in left arm: Secondary | ICD-10-CM | POA: Diagnosis not present

## 2017-04-23 MED ORDER — UNABLE TO FIND: 0 refills | Status: DC

## 2017-04-23 NOTE — Progress Notes (Signed)
Kendra Hanson     MRN: [id number]      DOB: [birth year]   HPI [name] is here for follow up and re-evaluation of chronic medical conditions, medication management and review of any available recent lab and radiology data.  Preventive health is updated, specifically  Cancer screening and Immunization.   Currently out of work because of LUE surgery ans will also need rUE surgery and may even potentially need c spine surgery, states her recovery is slower than  she expected. 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  consultations or procedures which the PT has had in the interim are  addressed. The PT denies any adverse reactions to current medications since the last visit.  Denies polyuria, polydipsia, blurred vision , or hypoglycemic episodes. Labs have improved , but unfortunately she as gained a significant amount of weight . Requests diabetic shoes for which she qualifies ROS Denies recent fever or chills. Denies sinus pressure, nasal congestion, ear pain or sore throat. Denies chest congestion, productive cough or wheezing. Denies chest pains, palpitations and leg swelling Denies abdominal pain, nausea, vomiting,diarrhea or constipation.   Denies dysuria, frequency, hesitancy or incontinence.  Denies depression, does have mild  Anxiety, she denies  insomnia. Denies skin break down or rash.   PE  BP 134/80 (BP Location: Left Arm, Patient Position: Sitting, Cuff Size: Large)   Pulse 84   Temp 97.3 F (36.3 C) (Temporal)   Resp 18   Ht 5\' 5"  (1.651 m)   Wt (!) 316 lb 0.6 oz (143.4 kg)   SpO2 96%   BMI 52.59 kg/m   Patient alert and oriented and in no cardiopulmonary distress.  HEENT: No facial asymmetry, EOMI,   oropharynx pink and moist.  Neck supple no JVD, no mass.  Chest: Clear to auscultation bilaterally.  CVS: S1, S2 no murmurs, no S3.Regular rate.  ABD: Soft non tender.   Ext:  No edema  MS: Adequate ROM spine, shoulders, hips and knees.  Skin: Intact, no ulcerations or rash noted.Surgical scars x 2 on LUE at elbow and wrist healing well  Psych: Good eye contact, normal affect. Memory intact not anxious or depressed appearing.  CNS: CN 2-12 intact, power,  normal throughout.no focal deficits noted.   Assessment & Plan  Essential hypertension Controlled, no change in medication DASH diet and commitment to daily physical activity for a minimum of 30 minutes discussed and encouraged, as a part of hypertension management. The importance of attaining a healthy weight is also discussed.  BP/Weight 04/23/2017 01/25/2017 01/15/2017 01/07/2017 12/01/2016 11/28/2016 40/76/8088  Systolic BP 110 315 945 859 - 292 446  Diastolic BP 80 70 72 99 - 74 80  Wt. (Lbs) 316.04 310 - 300 314.7 314 314  BMI 52.59 51.59 - 49.92 55.75 55.62 55.62       Hyperlipidemia LDL goal <100 Hyperlipidemia:Low fat diet discussed and encouraged.   Lipid Panel  Lab Results  Component Value Date   CHOL 181 04/12/2017   HDL 63 04/12/2017   LDLCALC 103 (H) 04/12/2017   TRIG 73 04/12/2017   CHOLHDL 2.9 04/12/2017    Improved tho uh still LDL is elevated   Morbid obesity Deteriorated. Patient re-educated about  the importance of commitment to a  minimum of 150 minutes of exercise per week.  The importance of healthy food choices with portion control discussed. Encouraged to start a food diary, count  calories and to consider  joining a support group. Sample diet sheets offered. Goals set by the patient for the next several months.   Weight /BMI 04/23/2017 01/25/2017 01/07/2017  WEIGHT 316 lb 0.6 oz 310 lb 300 lb  HEIGHT 5\' 5"  5\' 5"  5\' 5"   BMI 52.59 kg/m2 51.59 kg/m2 49.92 kg/m2      Type II diabetes mellitus with renal manifestations Improved [name] is reminded of the importance of commitment to daily physical activity for 30 minutes or more, as able and the need to limit  carbohydrate intake to 30 to 60 grams per meal to help with blood sugar control.   The need to take medication as prescribed, test blood sugar as directed, and to call between visits if there is a concern that blood sugar is uncontrolled is also discussed.   [name] is reminded of the importance of daily foot exam, annual eye examination, and good blood sugar, blood pressure and cholesterol control.  Diabetic Labs Latest Ref Rng & Units 04/12/2017 01/20/2017 10/20/2016 07/01/2016 03/10/2016  HbA1c <5.7 % 6.5(H) 6.7(H) 7.2(H) 7.0(H) 7.7(H)  Microalbumin Not Estab. ug/mL - - - - 110.4(H)  Micro/Creat Ratio 0.0 - 30.0 mg/g creat - - - - 82.1(H)  Chol <200 mg/dL 181 192 165 - 140  HDL >50 mg/dL 63 68 42(L) - 46  Calc LDL <100 mg/dL 103(H) 111(H) 110(H) - 85  Triglycerides <150 mg/dL 73 65 66 - 47  Creatinine 0.50 - 1.05 mg/dL 1.12(H) 1.19(H) 1.12(H) 1.00 1.04(H)   BP/Weight 04/23/2017 01/25/2017 01/15/2017 01/07/2017 12/01/2016 11/28/2016 16/08/9603  Systolic BP 540 981 191 478 - 295 621  Diastolic BP 80 70 72 99 - 74 80  Wt. (Lbs) 316.04 310 - 300 314.7 314 314  BMI 52.59 51.59 - 49.92 55.75 55.62 55.62   Foot/eye exam completion dates 04/23/2017 07/05/2016  Foot Form Completion Done Done        Upper extremity pain, diffuse, left Reports still present post operatively however under the care of surgery at this time

## 2017-04-23 NOTE — Therapy (Signed)
[place], [place], [address] Phone: [phone]   Fax:  [phone]  Occupational Therapy Treatment  Patient Details  Name: Kendra Hanson MRN: [id number] Date of Birth: [birth year] Referring Provider: Dr. Cyndy Freeze  Encounter Date: 04/23/2017      OT End of Session - 04/23/17 1624    Visit Number 9   Number of Visits 16   Date for OT Re-Evaluation 05/27/17   Authorization Type UMR 24 visits per year   Authorization Time Period yearly   Authorization - Visit Number 9   Authorization - Number of Visits 24   OT Start Time 1519   OT Stop Time 1605   OT Time Calculation (min) 46 min   Activity Tolerance Patient tolerated treatment well   Behavior During Therapy [facility] for tasks assessed/performed      Past Medical History:  Diagnosis Date  . Anemia   . Fibroids    Uterine  . GERD (gastroesophageal reflux disease)   . Hyperlipidemia 2008   Lipid profile in 04/2011:136, 53, 43  . Hypertension 2008   Normal CBC and CMet in 2012; negative stress nuclear in 2006- patient asymptomatic  . Insulin dependent diabetes mellitus ([place]) 1996  . Multiple allergies    perennial  . Normocytic normochromic anemia 12/27/2015  . Obesity   . PONV (postoperative nausea and vomiting)   . Sleep apnea    CPAP    Past Surgical History:  Procedure Laterality Date  . COLONOSCOPY N/A 04/05/2015   Procedure: COLONOSCOPY;  Surgeon: Rogene Houston, MD;  [facility];  Service: Endoscopy;  Laterality: N/A;  730  . DENTAL SURGERY    . ESOPHAGOGASTRODUODENOSCOPY N/A 01/21/2016   Procedure: ESOPHAGOGASTRODUODENOSCOPY (EGD);  Surgeon: Rogene Houston, MD;  Location: [facility];  Service: Endoscopy;  Laterality: N/A;  11:15  . REFRACTIVE SURGERY  2011   Bilateral, two seperate occasions first in 2006  . REMOVAL OF IMPLANT    . RETINAL DETACHMENT SURGERY Bilateral 05/2005    There were no vitals filed for this visit.       Subjective Assessment - 04/23/17 1523    Subjective  S: My elbow was really hurting last night.    Currently in Pain? Yes   Pain Score 5    Pain Location Wrist   Pain Orientation Left   Pain Descriptors / Indicators Tingling   Pain Type Acute pain   Pain Radiating Towards n/a   Pain Onset More than a month ago   Pain Frequency Intermittent   Aggravating Factors  use   Pain Relieving Factors rest   Effect of Pain on Daily Activities decreased ability to use left arm with daily tasks   Multiple Pain Sites No            [facility] OT Assessment - 04/23/17 1524      Assessment   Diagnosis S/P Left Carpal Tunnel Release and Ulnar Nerve Decompression     Precautions   Precautions None     Coordination   Left 9 Hole Peg Test 29.32"  31.08" previous     AROM   Left Elbow Flexion 139  135 previous   Left Elbow Extension 0  same as previous   Left Forearm Pronation 90 Degrees  same as previous   Left Forearm Supination 90 Degrees  60 previous   Left Wrist Extension 41 Degrees  30 previous   Left Wrist Flexion 50 Degrees  same as  previous     PROM   Overall PROM Comments P/ROM is [facility]      Strength   Strength Assessment Site Hand   Left Elbow Flexion 4/5  4-/5 previous   Left Elbow Extension 4-/5  4-/5 previous   Left Forearm Pronation 4-/5  same as previous   Left Forearm Supination 4/5  4-/5 previous   Left Wrist Flexion 4-/5  same as previous   Left Wrist Extension 4/5  4-/5 previous   Right/Left hand Right;Left   Left Hand Grip (lbs) --     Hand Function   Left Hand Grip (lbs) 34  40 previous   Left Hand Lateral Pinch 10 lbs  7 previous   Left 3 point pinch 8 lbs  4 previous                  OT Treatments/Exercises (OP) - 04/23/17 1620      Exercises   Exercises Elbow;Wrist;Hand     Elbow Exercises   Elbow Flexion Strengthening;10 reps   Bar Weights/Barbell (Elbow Flexion) 1 lb   Elbow Extension Strengthening;10 reps   Bar  Weights/Barbell (Elbow Extension) 1 lb   Forearm Supination Strengthening;10 reps  1#   Forearm Pronation Strengthening;10 reps  1#   Wrist Flexion Strengthening;10 reps   Bar Weights/Barbell (Wrist Flexion) 1 lb   Wrist Extension Strengthening;10 reps   Bar Weights/Barbell (Wrist Extension) 1 lb     Additional Elbow Exercises   Theraputty - Roll red   Theraputty - Grip red     Modalities   Modalities Ultrasound     Ultrasound   Ultrasound Location left elbow   Ultrasound Parameters 1.5w/cm2 5 minutes   Ultrasound Goals Other (Comment)  decreased scar adhesions and fascial restrictions     Manual Therapy   Manual Therapy Myofascial release   Manual therapy comments manual therapy completed seperately from all other interventions this date   Myofascial Release myofascial release and manual stretching to left elbow region, forearm, wrist, and hand to decrease pain and restrictions and improve pain free mobility in left arm.  Scar release to both surgical scars to decreae pliability and decrease restrictions                   OT Short Term Goals - 04/23/17 1627      OT SHORT TERM GOAL #1   Title Patient will be educated on a HEP for improved function of LUE.     Time 4   Period Weeks   Status On-going     OT SHORT TERM GOAL #2   Title Patient will be independent with donning and doffing therapist fabricated elbow extenison splint for decreased pressure on ulnar nerve.    Time 4   Period Weeks   Status Achieved     OT SHORT TERM GOAL #3   Title Patient will improve LUE AROM by 5 degrees for improved ability to don and doff shirts.   Time 4   Period Weeks   Status Achieved     OT SHORT TERM GOAL #4   Title Patient will improve left arm strength to 4/5 for greater ability to lift titems over head at work.     Time 4   Period Weeks   Status On-going     OT SHORT TERM GOAL #5   Title Patient will improve her left grip strength by 10# and pinch by 2 pounds  for greater ability to open containers with left  hand.   Time 4   Period Weeks   Status Partially Met     OT SHORT TERM GOAL #6   Title Patient will improve fine motor coordination as evidence by improving completion time on nine hole peg test by 3".   Time 4   Period Weeks   Status On-going     OT SHORT TERM GOAL #7   Title Patient will decrease fascial and scar restrictions to moderate in her left arm and hand.     Time 4   Period Weeks   Status On-going           OT Long Term Goals - 03/30/17 1236      OT LONG TERM GOAL #1   Title Patient will return to prior level of independence with all daily tasks.    Time 8   Period Weeks   Status On-going     OT LONG TERM GOAL #2   Title Patient will improve left arm AROM to [facility] for improved ability to reach [place].   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   Time 8   Period Weeks   Status On-going     OT LONG TERM GOAL #4   Title Patient will improve fine motor coordination by decreasimg completion time on nine hole peg test to 26" or less.   Time 8   Period Weeks   Status On-going     OT LONG TERM GOAL #5   Title Patient will have 5/5 strength in left arm for improved ability to complete work tasks.    Time 8   Period Weeks   Status On-going     OT LONG TERM GOAL #6   Title Patients scar tissue and fascia will have min restrictions for greater mobility and strength in left arm.    Time 8   Period Weeks   Status On-going     OT LONG TERM GOAL #7   Title Patient will have 2/10 pain or less in left arm when completing work duties.    Time 8   Period Weeks   Status On-going     OT LONG TERM GOAL #8   Title Patient will have normal sensation in her left arm and hand for greater safety with ADL completion.    Time 8   Period Weeks    Status On-going               Plan - 04/23/17 1625    Clinical Impression Statement A: Mini-reassessment completed this session, pt is making progress towards goals including pain, fascial restrictions, ROM, strength, and fine motor coordination; meeting 2/7 STGs and partially meeting and additional STG. Pt reporting occasional pain in wrist and elbow, after previous session experiencing severe pain at night. [place] completed only to elbow region today due to discomfort at wrist after [place] during previous session.    Plan P: Follow up on MD appt, continue with elbow and wrist strengthening, grip and pinch strengthening.   OT Home Exercise Plan 03/29/17:  tendon glides and A/ROM of wrist, elbow, hand.    Consulted and Agree with Plan of Care Patient      Patient will benefit from skilled therapeutic intervention in order to improve the following deficits  and impairments:  Decreased coordination, Decreased range of motion, Decreased scar mobility, Decreased strength, Increased fascial restricitons, Impaired UE functional use, Pain  Visit Diagnosis: Pain in left wrist  Pain in left elbow  Stiffness of left wrist, not elsewhere classified  Other symptoms and signs involving the musculoskeletal system  Other lack of coordination    Problem List Patient Active Problem List   Diagnosis Date Noted  . Upper extremity pain, diffuse, left 01/26/2017  . Encounter for medication management 11/30/2016  . Osteoarthritis of both knees 12/27/2015  . Normocytic normochromic anemia 12/27/2015  . Carpal tunnel syndrome 07/12/2015  . Bilateral leg numbness 07/12/2015  . Pigmented skin lesion 05/06/2015  . Iron deficiency 03/14/2015  . Seasonal allergies 02/01/2015  . Piles (hemorrhoids) 09/25/2013  . Sleep apnea 06/13/2013  . Vitamin D deficiency 06/05/2012  . GERD (gastroesophageal reflux disease) 06/03/2012  . Leiomyoma of uterus 10/30/2008  . Type II diabetes mellitus with renal  manifestations ([place]) 02/25/2008  . Hyperlipidemia LDL goal <100 02/25/2008  . Morbid obesity ([place]) 02/25/2008  . Essential hypertension 02/25/2008   Guadelupe Sabin, OTR/L  [phone] 04/23/2017, 4:29 PM  [facility] [address] [place], [place], [address] Phone: [phone]   Fax:  [phone]  Name: Kendra Hanson MRN: [id number] Date of Birth: [birth year]

## 2017-04-23 NOTE — Patient Instructions (Signed)
F/u in 4 months, call if you need me before    Call if you need me before    hBA1C chem 7 and EGFR in 4 months   Please take the furosemide three times daily as prescribed, you have leg swelling  Thank you  for choosing [facility]. We consider it a privelige to serve you.  Delivering excellent health care in a caring and  compassionate way is our goal.  Partnering with you,  so that together we can achieve this goal is our strategy.       

## 2017-04-23 NOTE — Assessment & Plan Note (Signed)
Hyperlipidemia:Low fat diet discussed and encouraged.   Lipid Panel  Lab Results  Component Value Date   CHOL 181 04/12/2017   HDL 63 04/12/2017   LDLCALC 103 (H) 04/12/2017   TRIG 73 04/12/2017   CHOLHDL 2.9 04/12/2017    Improved tho uh still LDL is elevated

## 2017-04-23 NOTE — Assessment & Plan Note (Signed)
Reports still present post operatively however under the care of surgery at this time

## 2017-04-23 NOTE — Assessment & Plan Note (Signed)
Improved Kendra Hanson is reminded of the importance of commitment to daily physical activity for 30 minutes or more, as able and the need to limit carbohydrate intake to 30 to 60 grams per meal to help with blood sugar control.   The need to take medication as prescribed, test blood sugar as directed, and to call between visits if there is a concern that blood sugar is uncontrolled is also discussed.   Kendra Hanson is reminded of the importance of daily foot exam, annual eye examination, and good blood sugar, blood pressure and cholesterol control.  Diabetic Labs Latest Ref Rng & Units 04/12/2017 01/20/2017 10/20/2016 07/01/2016 03/10/2016  HbA1c <5.7 % 6.5(H) 6.7(H) 7.2(H) 7.0(H) 7.7(H)  Microalbumin Not Estab. ug/mL - - - - 110.4(H)  Micro/Creat Ratio 0.0 - 30.0 mg/g creat - - - - 82.1(H)  Chol <200 mg/dL 181 192 165 - 140  HDL >50 mg/dL 63 68 42(L) - 46  Calc LDL <100 mg/dL 103(H) 111(H) 110(H) - 85  Triglycerides <150 mg/dL 73 65 66 - 47  Creatinine 0.50 - 1.05 mg/dL 1.12(H) 1.19(H) 1.12(H) 1.00 1.04(H)   BP/Weight 04/23/2017 01/25/2017 01/15/2017 01/07/2017 12/01/2016 11/28/2016 20/08/711  Systolic BP 197 588 325 498 - 264 158  Diastolic BP 80 70 72 99 - 74 80  Wt. (Lbs) 316.04 310 - 300 314.7 314 314  BMI 52.59 51.59 - 49.92 55.75 55.62 55.62   Foot/eye exam completion dates 04/23/2017 07/05/2016  Foot Form Completion Done Done

## 2017-04-23 NOTE — Assessment & Plan Note (Signed)
Controlled, no change in medication DASH diet and commitment to daily physical activity for a minimum of 30 minutes discussed and encouraged, as a part of hypertension management. The importance of attaining a healthy weight is also discussed.  BP/Weight 04/23/2017 01/25/2017 01/15/2017 01/07/2017 12/01/2016 11/28/2016 82/95/6213  Systolic BP 086 578 469 629 - 528 413  Diastolic BP 80 70 72 99 - 74 80  Wt. (Lbs) 316.04 310 - 300 314.7 314 314  BMI 52.59 51.59 - 49.92 55.75 55.62 55.62

## 2017-04-23 NOTE — Assessment & Plan Note (Signed)
Deteriorated. Patient re-educated about  the importance of commitment to a  minimum of 150 minutes of exercise per week.  The importance of healthy food choices with portion control discussed. Encouraged to start a food diary, count calories and to consider  joining a support group. Sample diet sheets offered. Goals set by the patient for the next several months.   Weight /BMI 04/23/2017 01/25/2017 01/07/2017  WEIGHT 316 lb 0.6 oz 310 lb 300 lb  HEIGHT 5\' 5"  5\' 5"  5\' 5"   BMI 52.59 kg/m2 51.59 kg/m2 49.92 kg/m2

## 2017-04-24 MED FILL — ESTRADIOL PATCH 0.0375: 0.0375 | 28 days supply | Qty: 8 | Fill #9

## 2017-04-24 MED FILL — PROGESTERONE 100 MG CAPSULE: 100 | 30 days supply | Qty: 30 | Fill #4

## 2017-04-25 ENCOUNTER — Other Ambulatory Visit: Payer: Self-pay | Admitting: *Deleted

## 2017-04-25 NOTE — Patient Outreach (Signed)
Met with [name] to issue her a new activity tracker and pair it with her phone. Discussed the success of her glycemic management and achieving the best Hgb A1C in the last 10 years using the [employer]. [name] says she has applied for long term disability related to her severe bilateral carpel tunnel disease. Advised [name] that she will not longer be eligible for the [facility] platform if she leaves the [employer] health insurance. Barrington Ellison RN,CCM,CDE [place] Management Coordinator [facility] [employer] [phone] Office Fax [phone]

## 2017-04-26 ENCOUNTER — Ambulatory Visit (HOSPITAL_COMMUNITY): Payer: 59

## 2017-04-26 ENCOUNTER — Encounter (HOSPITAL_COMMUNITY): Payer: Self-pay

## 2017-04-26 DIAGNOSIS — M25632 Stiffness of left wrist, not elsewhere classified: Secondary | ICD-10-CM

## 2017-04-26 DIAGNOSIS — R278 Other lack of coordination: Secondary | ICD-10-CM | POA: Diagnosis not present

## 2017-04-26 DIAGNOSIS — M25532 Pain in left wrist: Secondary | ICD-10-CM

## 2017-04-26 DIAGNOSIS — M25522 Pain in left elbow: Secondary | ICD-10-CM

## 2017-04-26 DIAGNOSIS — R29898 Other symptoms and signs involving the musculoskeletal system: Secondary | ICD-10-CM | POA: Diagnosis not present

## 2017-04-26 NOTE — Therapy (Signed)
[place], [place], [address] Phone: [phone]   Fax:  [phone]  Occupational Therapy Treatment  Patient Details  Name: Kendra Hanson MRN: [id number] Date of Birth: August 17, 1961 Referring Provider: Dr. Cyndy Freeze  Encounter Date: 04/26/2017      OT End of Session - 04/26/17 1559    Visit Number 10   Number of Visits 16   Date for OT Re-Evaluation 05/27/17   Authorization Type UMR 24 visits per year   Authorization Time Period yearly   Authorization - Visit Number 10   Authorization - Number of Visits 24   OT Start Time 1520   OT Stop Time 1600   OT Time Calculation (min) 40 min   Activity Tolerance Patient tolerated treatment well   Behavior During Therapy [facility] for tasks assessed/performed      Past Medical History:  Diagnosis Date  . Anemia   . Fibroids    Uterine  . GERD (gastroesophageal reflux disease)   . Hyperlipidemia 2008   Lipid profile in 04/2011:136, 53, 43  . Hypertension 2008   Normal CBC and CMet in 2012; negative stress nuclear in 2006- patient asymptomatic  . Insulin dependent diabetes mellitus ([place]) 1996  . Multiple allergies    perennial  . Normocytic normochromic anemia 12/27/2015  . Obesity   . PONV (postoperative nausea and vomiting)   . Sleep apnea    CPAP    Past Surgical History:  Procedure Laterality Date  . COLONOSCOPY N/A 04/05/2015   Procedure: COLONOSCOPY;  Surgeon: Rogene Houston, MD;  [facility];  Service: Endoscopy;  Laterality: N/A;  730  . DENTAL SURGERY    . ESOPHAGOGASTRODUODENOSCOPY N/A 01/21/2016   Procedure: ESOPHAGOGASTRODUODENOSCOPY (EGD);  Surgeon: Rogene Houston, MD;  [facility];  Service: Endoscopy;  Laterality: N/A;  11:15  . REFRACTIVE SURGERY  2011   Bilateral, two seperate occasions first in 2006  . REMOVAL OF IMPLANT    . RETINAL DETACHMENT SURGERY Bilateral 05/2005    There were no vitals filed for this visit.       Subjective Assessment - 04/26/17 1541    Subjective  S: I felt some really bad sharp pains in my elbow and wrist starting last night.   Currently in Pain? Yes   Pain Score 6    Pain Location Hand   Pain Orientation Left   Pain Descriptors / Indicators Tingling   Pain Type Acute pain   Pain Radiating Towards elbow   Pain Onset More than a month ago   Pain Frequency Intermittent   Aggravating Factors  use   Pain Relieving Factors rest   Effect of Pain on Daily Activities min effect   Multiple Pain Sites No            [facility] OT Assessment - 04/26/17 1543      Assessment   Diagnosis S/P Left Carpal Tunnel Release and Ulnar Nerve Decompression     Precautions   Precautions None                  OT Treatments/Exercises (OP) - 04/26/17 1543      Exercises   Exercises Elbow;Wrist;Hand     Additional Elbow Exercises   Theraputty Flatten;Roll;Grip  Used PVC pipe to make circles   Theraputty - Flatten red   Theraputty - Roll red   Theraputty - Grip red     Modalities   Modalities Ultrasound  Ultrasound   Ultrasound Location left elbow   Ultrasound Parameters 1.5w/cm2 5 minutes   Ultrasound Goals Other (Comment)  to decrease scar adhesions and fascial restrictions     Manual Therapy   Manual Therapy Myofascial release   Manual therapy comments manual therapy completed seperately from all other interventions this date   Myofascial Release myofascial release and manual stretching to left elbow region, forearm, wrist, and hand to decrease pain and restrictions and improve pain free mobility in left arm.  Scar release to both surgical scars to decreae pliability and decrease restrictions                   OT Short Term Goals - 04/23/17 1627      OT SHORT TERM GOAL #1   Title Patient will be educated on a HEP for improved function of LUE.     Time 4   Period Weeks   Status On-going     OT SHORT TERM GOAL #2   Title Patient will be independent  with donning and doffing therapist fabricated elbow extenison splint for decreased pressure on ulnar nerve.    Time 4   Period Weeks   Status Achieved     OT SHORT TERM GOAL #3   Title Patient will improve LUE AROM by 5 degrees for improved ability to don and doff shirts.   Time 4   Period Weeks   Status Achieved     OT SHORT TERM GOAL #4   Title Patient will improve left arm strength to 4/5 for greater ability to lift titems over head at work.     Time 4   Period Weeks   Status On-going     OT SHORT TERM GOAL #5   Title Patient will improve her left grip strength by 10# and pinch by 2 pounds for greater ability to open containers with left hand.   Time 4   Period Weeks   Status Partially Met     OT SHORT TERM GOAL #6   Title Patient will improve fine motor coordination as evidence by improving completion time on nine hole peg test by 3".   Time 4   Period Weeks   Status On-going     OT SHORT TERM GOAL #7   Title Patient will decrease fascial and scar restrictions to moderate in her left arm and hand.     Time 4   Period Weeks   Status On-going           OT Long Term Goals - 03/30/17 1236      OT LONG TERM GOAL #1   Title Patient will return to prior level of independence with all daily tasks.    Time 8   Period Weeks   Status On-going     OT LONG TERM GOAL #2   Title Patient will improve left arm AROM to [facility] for improved ability to reach [place].   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   Time 8   Period Weeks   Status On-going     OT LONG TERM GOAL #4   Title Patient will improve fine motor coordination by decreasimg completion time on nine hole peg test to 26" or less.   Time 8  Period Weeks   Status On-going     OT LONG TERM GOAL #5   Title Patient will have 5/5  strength in left arm for improved ability to complete work tasks.    Time 8   Period Weeks   Status On-going     OT LONG TERM GOAL #6   Title Patients scar tissue and fascia will have min restrictions for greater mobility and strength in left arm.    Time 8   Period Weeks   Status On-going     OT LONG TERM GOAL #7   Title Patient will have 2/10 pain or less in left arm when completing work duties.    Time 8   Period Weeks   Status On-going     OT LONG TERM GOAL #8   Title Patient will have normal sensation in her left arm and hand for greater safety with ADL completion.    Time 8   Period Weeks   Status On-going               Plan - 04/26/17 1643    Clinical Impression Statement A: Pt reported more pain over the last few days than usual. Began session with [place] to decrease pain at elbow. Completed myofascial release to address pain around scar tissue at wrist. Theraputty exercises used to increase grip and pinch strength, VC needed for form and technique.   Plan P: Complete missed elbow and wrist strengthening exercises.      Patient will benefit from skilled therapeutic intervention in order to improve the following deficits and impairments:  Decreased coordination, Decreased range of motion, Decreased scar mobility, Decreased strength, Increased fascial restricitons, Impaired UE functional use, Pain  Visit Diagnosis: Pain in left wrist  Pain in left elbow  Stiffness of left wrist, not elsewhere classified    Problem List Patient Active Problem List   Diagnosis Date Noted  . Upper extremity pain, diffuse, left 01/26/2017  . Osteoarthritis of both knees 12/27/2015  . Normocytic normochromic anemia 12/27/2015  . Carpal tunnel syndrome 07/12/2015  . Bilateral leg numbness 07/12/2015  . Pigmented skin lesion 05/06/2015  . Iron deficiency 03/14/2015  . Seasonal allergies 02/01/2015  . Piles (hemorrhoids) 09/25/2013  . Sleep apnea 06/13/2013  . Vitamin D  deficiency 06/05/2012  . GERD (gastroesophageal reflux disease) 06/03/2012  . Leiomyoma of uterus 10/30/2008  . Type II diabetes mellitus with renal manifestations ([place]) 02/25/2008  . Hyperlipidemia LDL goal <100 02/25/2008  . Morbid obesity ([place]) 02/25/2008  . Essential hypertension 02/25/2008    Luther Hearing, OT Student [phone] 04/26/2017, 4:55 PM  [facility] [address] [place], [place], [address] Phone: [phone]   Fax:  [phone]  Name: KASEN SAKO MRN: [id number] Date of Birth: 1961/06/19    Note reviewed by clinical instructor and accurately reflects treatment session.   Ailene Ravel, OTR/L,CBIS  [phone]

## 2017-05-02 ENCOUNTER — Ambulatory Visit (HOSPITAL_COMMUNITY): Payer: 59 | Admitting: Occupational Therapy

## 2017-05-02 ENCOUNTER — Encounter (HOSPITAL_COMMUNITY): Payer: Self-pay | Admitting: Occupational Therapy

## 2017-05-02 DIAGNOSIS — R29898 Other symptoms and signs involving the musculoskeletal system: Secondary | ICD-10-CM | POA: Diagnosis not present

## 2017-05-02 DIAGNOSIS — M25522 Pain in left elbow: Secondary | ICD-10-CM | POA: Diagnosis not present

## 2017-05-02 DIAGNOSIS — M25632 Stiffness of left wrist, not elsewhere classified: Secondary | ICD-10-CM | POA: Diagnosis not present

## 2017-05-02 DIAGNOSIS — R278 Other lack of coordination: Secondary | ICD-10-CM | POA: Diagnosis not present

## 2017-05-02 DIAGNOSIS — M25532 Pain in left wrist: Secondary | ICD-10-CM | POA: Diagnosis not present

## 2017-05-02 NOTE — Therapy (Signed)
[place], [place], [address] Phone: [phone]   Fax:  [phone]  Occupational Therapy Treatment  Patient Details  Name: Kendra Hanson MRN: [id number] Date of Birth: 1961-02-04 Referring Provider: Dr. Cyndy Freeze  Encounter Date: 05/02/2017      OT End of Session - 05/02/17 1608    Visit Number 11   Number of Visits 16   Date for OT Re-Evaluation 05/27/17   Authorization Type UMR 24 visits per year   Authorization Time Period yearly   Authorization - Visit Number 11   Authorization - Number of Visits 24   OT Start Time 1520   OT Stop Time 1601   OT Time Calculation (min) 41 min   Activity Tolerance Patient tolerated treatment well   Behavior During Therapy [facility] for tasks assessed/performed      Past Medical History:  Diagnosis Date  . Anemia   . Fibroids    Uterine  . GERD (gastroesophageal reflux disease)   . Hyperlipidemia 2008   Lipid profile in 04/2011:136, 53, 43  . Hypertension 2008   Normal CBC and CMet in 2012; negative stress nuclear in 2006- patient asymptomatic  . Insulin dependent diabetes mellitus ([place]) 1996  . Multiple allergies    perennial  . Normocytic normochromic anemia 12/27/2015  . Obesity   . PONV (postoperative nausea and vomiting)   . Sleep apnea    CPAP    Past Surgical History:  Procedure Laterality Date  . COLONOSCOPY N/A 04/05/2015   Procedure: COLONOSCOPY;  Surgeon: Rogene Houston, MD;  [facility];  Service: Endoscopy;  Laterality: N/A;  730  . DENTAL SURGERY    . ESOPHAGOGASTRODUODENOSCOPY N/A 01/21/2016   Procedure: ESOPHAGOGASTRODUODENOSCOPY (EGD);  Surgeon: Rogene Houston, MD;  [facility]: [facility];  Service: Endoscopy;  Laterality: N/A;  11:15  . REFRACTIVE SURGERY  2011   Bilateral, two seperate occasions first in 2006  . REMOVAL OF IMPLANT    . RETINAL DETACHMENT SURGERY Bilateral 05/2005    There were no vitals filed for this visit.       Subjective Assessment - 05/02/17 1607    Subjective  S: I'm only having occasional shooting pains down my arm.    Currently in Pain? No/denies                      OT Treatments/Exercises (OP) - 05/02/17 1535      Exercises   Exercises Elbow;Wrist;Hand     Elbow Exercises   Elbow Flexion Strengthening;15 reps   Bar Weights/Barbell (Elbow Flexion) 1 lb   Elbow Extension Strengthening;15 reps   Bar Weights/Barbell (Elbow Extension) 1 lb   Forearm Supination Strengthening;15 reps  1#   Forearm Pronation Strengthening;15 reps  1#   Wrist Flexion Strengthening;15 reps   Bar Weights/Barbell (Wrist Flexion) 1 lb   Wrist Extension Strengthening;15 reps   Bar Weights/Barbell (Wrist Extension) 1 lb     Additional Elbow Exercises   Sponges Pt used green clothespin to grasp 25 sponges and place in bucket alternating between lateral and 3 point pinch. Mod difficulty and increased time required    Hand Gripper with Large Beads all beads with gripper set at 29#   Hand Gripper with Medium Beads all beads with gripper set at 29#   Hand Gripper with Small Beads 9/15 beads with gripper set at 25#     Manual Therapy   Manual Therapy Myofascial release  Manual therapy comments manual therapy completed seperately from all other interventions this date   Myofascial Release myofascial release and manual stretching to left elbow region, forearm, wrist, and hand to decrease pain and restrictions and improve pain free mobility in left arm.  Scar release to both surgical scars to decreae pliability and decrease restrictions                   OT Short Term Goals - 04/23/17 1627      OT SHORT TERM GOAL #1   Title Patient will be educated on a HEP for improved function of LUE.     Time 4   Period Weeks   Status On-going     OT SHORT TERM GOAL #2   Title Patient will be independent with donning and doffing therapist fabricated elbow extenison splint for decreased pressure on  ulnar nerve.    Time 4   Period Weeks   Status Achieved     OT SHORT TERM GOAL #3   Title Patient will improve LUE AROM by 5 degrees for improved ability to don and doff shirts.   Time 4   Period Weeks   Status Achieved     OT SHORT TERM GOAL #4   Title Patient will improve left arm strength to 4/5 for greater ability to lift titems over head at work.     Time 4   Period Weeks   Status On-going     OT SHORT TERM GOAL #5   Title Patient will improve her left grip strength by 10# and pinch by 2 pounds for greater ability to open containers with left hand.   Time 4   Period Weeks   Status Partially Met     OT SHORT TERM GOAL #6   Title Patient will improve fine motor coordination as evidence by improving completion time on nine hole peg test by 3".   Time 4   Period Weeks   Status On-going     OT SHORT TERM GOAL #7   Title Patient will decrease fascial and scar restrictions to moderate in her left arm and hand.     Time 4   Period Weeks   Status On-going           OT Long Term Goals - 03/30/17 1236      OT LONG TERM GOAL #1   Title Patient will return to prior level of independence with all daily tasks.    Time 8   Period Weeks   Status On-going     OT LONG TERM GOAL #2   Title Patient will improve left arm AROM to [facility] for improved ability to reach [place].   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   Time 8   Period Weeks   Status On-going     OT LONG TERM GOAL #4   Title Patient will improve fine motor coordination by decreasimg completion time on nine hole peg test to 26" or less.   Time 8   Period Weeks   Status On-going     OT LONG TERM GOAL #5   Title Patient will have 5/5 strength in left arm for improved ability to complete work tasks.    Time 8  Period Weeks    Status On-going     OT LONG TERM GOAL #6   Title Patients scar tissue and fascia will have min restrictions for greater mobility and strength in left arm.    Time 8   Period Weeks   Status On-going     OT LONG TERM GOAL #7   Title Patient will have 2/10 pain or less in left arm when completing work duties.    Time 8   Period Weeks   Status On-going     OT LONG TERM GOAL #8   Title Patient will have normal sensation in her left arm and hand for greater safety with ADL completion.    Time 8   Period Weeks   Status On-going               Plan - 05/02/17 1608    Clinical Impression Statement A: Pt reports decreased pain, experiencing the occasional shooting pain at incisions. Resumed wrist and elbow strengthening, grip strengthening, and added pinch strengthening activities. Pt required intermittent rest breaks due to fatigue. Verbal cuing for technique with grip and pinch strengthening.    Plan P: Increase strengthening exercises to 2# if pt able to tolerate. Continue with grip and pinch-attempt pinch tree    OT Home Exercise Plan 03/29/17:  tendon glides and A/ROM of wrist, elbow, hand.    Consulted and Agree with Plan of Care Patient      Patient will benefit from skilled therapeutic intervention in order to improve the following deficits and impairments:  Decreased coordination, Decreased range of motion, Decreased scar mobility, Decreased strength, Increased fascial restricitons, Impaired UE functional use, Pain  Visit Diagnosis: Pain in left wrist  Pain in left elbow  Stiffness of left wrist, not elsewhere classified  Other symptoms and signs involving the musculoskeletal system    Problem List Patient Active Problem List   Diagnosis Date Noted  . Upper extremity pain, diffuse, left 01/26/2017  . Osteoarthritis of both knees 12/27/2015  . Normocytic normochromic anemia 12/27/2015  . Carpal tunnel syndrome 07/12/2015  . Bilateral leg numbness 07/12/2015  .  Pigmented skin lesion 05/06/2015  . Iron deficiency 03/14/2015  . Seasonal allergies 02/01/2015  . Piles (hemorrhoids) 09/25/2013  . Sleep apnea 06/13/2013  . Vitamin D deficiency 06/05/2012  . GERD (gastroesophageal reflux disease) 06/03/2012  . Leiomyoma of uterus 10/30/2008  . Type II diabetes mellitus with renal manifestations ([place]) 02/25/2008  . Hyperlipidemia LDL goal <100 02/25/2008  . Morbid obesity ([place]) 02/25/2008  . Essential hypertension 02/25/2008   Guadelupe Sabin, OTR/L  [phone] 05/02/2017, 4:11 PM  [place] [address] [place], [place], [address] Phone: [phone]   Fax:  [phone]  Name: NAIRI OSWALD MRN: [id number] Date of Birth: Aug 30, 1961

## 2017-05-03 ENCOUNTER — Encounter (HOSPITAL_COMMUNITY): Payer: Self-pay | Admitting: Occupational Therapy

## 2017-05-03 ENCOUNTER — Ambulatory Visit (HOSPITAL_COMMUNITY): Payer: 59

## 2017-05-03 DIAGNOSIS — M25632 Stiffness of left wrist, not elsewhere classified: Secondary | ICD-10-CM

## 2017-05-03 DIAGNOSIS — M25532 Pain in left wrist: Secondary | ICD-10-CM

## 2017-05-03 DIAGNOSIS — M25522 Pain in left elbow: Secondary | ICD-10-CM

## 2017-05-03 DIAGNOSIS — R278 Other lack of coordination: Secondary | ICD-10-CM | POA: Diagnosis not present

## 2017-05-03 DIAGNOSIS — R29898 Other symptoms and signs involving the musculoskeletal system: Secondary | ICD-10-CM | POA: Diagnosis not present

## 2017-05-03 NOTE — Therapy (Signed)
[place], [place], [address] Phone: [phone]   Fax:  [phone]  Occupational Therapy Treatment  Patient Details  Name: Kendra Hanson MRN: [id number] Date of Birth: [birth year] Referring Provider: Dr. Cyndy Freeze  Encounter Date: 05/03/2017      OT End of Session - 05/03/17 1640    Visit Number 12   Number of Visits 16   Date for OT Re-Evaluation 05/27/17   Authorization Type UMR 24 visits per year   Authorization Time Period yearly   Authorization - Visit Number 12   Authorization - Number of Visits 24   OT Start Time 8315   OT Stop Time 1600   OT Time Calculation (min) 38 min   Activity Tolerance Patient tolerated treatment well   Behavior During Therapy [facility] for tasks assessed/performed      Past Medical History:  Diagnosis Date  . Anemia   . Fibroids    Uterine  . GERD (gastroesophageal reflux disease)   . Hyperlipidemia 2008   Lipid profile in 04/2011:136, 53, 43  . Hypertension 2008   Normal CBC and CMet in 2012; negative stress nuclear in 2006- patient asymptomatic  . Insulin dependent diabetes mellitus ([place]) 1996  . Multiple allergies    perennial  . Normocytic normochromic anemia 12/27/2015  . Obesity   . PONV (postoperative nausea and vomiting)   . Sleep apnea    CPAP    Past Surgical History:  Procedure Laterality Date  . COLONOSCOPY N/A 04/05/2015   Procedure: COLONOSCOPY;  Surgeon: Rogene Houston, MD;  [facility];  Service: Endoscopy;  Laterality: N/A;  730  . DENTAL SURGERY    . ESOPHAGOGASTRODUODENOSCOPY N/A 01/21/2016   Procedure: ESOPHAGOGASTRODUODENOSCOPY (EGD);  Surgeon: Rogene Houston, MD;  [facility];  Service: Endoscopy;  Laterality: N/A;  11:15  . REFRACTIVE SURGERY  2011   Bilateral, two seperate occasions first in 2006  . REMOVAL OF IMPLANT    . RETINAL DETACHMENT SURGERY Bilateral 05/2005    There were no vitals filed for this visit.       Subjective Assessment - 05/03/17 1640    Subjective  S: Sometimes my hand still tingles, I don't know why it does that.   Currently in Pain? No/denies            [facility] OT Assessment - 05/03/17 1523      Assessment   Diagnosis S/P Left Carpal Tunnel Release and Ulnar Nerve Decompression     Precautions   Precautions None                  OT Treatments/Exercises (OP) - 05/03/17 1523      Exercises   Exercises Elbow;Wrist;Hand     Elbow Exercises   Elbow Flexion Strengthening;10 reps   Bar Weights/Barbell (Elbow Flexion) 2 lbs   Elbow Extension Strengthening;10 reps   Bar Weights/Barbell (Elbow Extension) 2 lbs   Forearm Supination Strengthening;10 reps  2#   Forearm Pronation Strengthening;10 reps  2#   Wrist Flexion Strengthening;10 reps   Bar Weights/Barbell (Wrist Flexion) 2 lbs   Wrist Extension Strengthening;10 reps   Bar Weights/Barbell (Wrist Extension) 2 lbs     Hand Exercises   Other Hand Exercises Pt completed pinch tree putting all green, red and yellow clothes pins onto tree with 3 point-pinch and removed them all with lateral pinch.   Other Hand Exercises Pt used Eggsercizer eggs to target grip strengthening. Started with  the softest, then soft, then to medium, finished with firm. Squeezed each egg 10 times.     Fine Motor Coordination   Fine Motor Coordination Flipping cards;Dealing card with thumb;Nuts and Bolts   Flipping cards Pt flipped entire deck of cards one at a time   Dealing card with thumb Pt dealt entre deck of cards one at a time using thumb   Nuts and Bolts Pt twisted off and on 10 nuts and bolts                OT Education - 05/03/17 1640    Education provided No          OT Short Term Goals - 04/23/17 1627      OT SHORT TERM GOAL #1   Title Patient will be educated on a HEP for improved function of LUE.     Time 4   Period Weeks   Status On-going     OT SHORT TERM GOAL #2   Title Patient will be  independent with donning and doffing therapist fabricated elbow extenison splint for decreased pressure on ulnar nerve.    Time 4   Period Weeks   Status Achieved     OT SHORT TERM GOAL #3   Title Patient will improve LUE AROM by 5 degrees for improved ability to don and doff shirts.   Time 4   Period Weeks   Status Achieved     OT SHORT TERM GOAL #4   Title Patient will improve left arm strength to 4/5 for greater ability to lift titems over head at work.     Time 4   Period Weeks   Status On-going     OT SHORT TERM GOAL #5   Title Patient will improve her left grip strength by 10# and pinch by 2 pounds for greater ability to open containers with left hand.   Time 4   Period Weeks   Status Partially Met     OT SHORT TERM GOAL #6   Title Patient will improve fine motor coordination as evidence by improving completion time on nine hole peg test by 3".   Time 4   Period Weeks   Status On-going     OT SHORT TERM GOAL #7   Title Patient will decrease fascial and scar restrictions to moderate in her left arm and hand.     Time 4   Period Weeks   Status On-going           OT Long Term Goals - 03/30/17 1236      OT LONG TERM GOAL #1   Title Patient will return to prior level of independence with all daily tasks.    Time 8   Period Weeks   Status On-going     OT LONG TERM GOAL #2   Title Patient will improve left arm AROM to [facility] for improved ability to reach [place].   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   Time 8   Period Weeks   Status On-going     OT LONG TERM GOAL #4   Title Patient will improve fine motor coordination by decreasimg completion time on nine hole peg test to 26" or less.   Time 8  Period Weeks   Status On-going     OT LONG TERM GOAL #5   Title Patient will  have 5/5 strength in left arm for improved ability to complete work tasks.    Time 8   Period Weeks   Status On-going     OT LONG TERM GOAL #6   Title Patients scar tissue and fascia will have min restrictions for greater mobility and strength in left arm.    Time 8   Period Weeks   Status On-going     OT LONG TERM GOAL #7   Title Patient will have 2/10 pain or less in left arm when completing work duties.    Time 8   Period Weeks   Status On-going     OT LONG TERM GOAL #8   Title Patient will have normal sensation in her left arm and hand for greater safety with ADL completion.    Time 8   Period Weeks   Status On-going               Plan - 05/03/17 1641    Clinical Impression Statement A: Session focused on grip and pinch strengthening. Elbow and wrist strengthening exercises increased to 2#. Vc needed for form and technique with dealing and flipping deck of cards as well as with the pinch tree. Pt required a mod amount of rest breaks during eggcersizer eggs and nuts and bolts activities.    Plan P: Continue with strengthening exercises at 2#. Complete nuts and bolts activity again.      Patient will benefit from skilled therapeutic intervention in order to improve the following deficits and impairments:  Decreased coordination, Decreased range of motion, Decreased scar mobility, Decreased strength, Increased fascial restricitons, Impaired UE functional use, Pain  Visit Diagnosis: Pain in left wrist  Pain in left elbow  Stiffness of left wrist, not elsewhere classified    Problem List Patient Active Problem List   Diagnosis Date Noted  . Upper extremity pain, diffuse, left 01/26/2017  . Osteoarthritis of both knees 12/27/2015  . Normocytic normochromic anemia 12/27/2015  . Carpal tunnel syndrome 07/12/2015  . Bilateral leg numbness 07/12/2015  . Pigmented skin lesion 05/06/2015  . Iron deficiency 03/14/2015  . Seasonal allergies 02/01/2015  . Piles  (hemorrhoids) 09/25/2013  . Sleep apnea 06/13/2013  . Vitamin D deficiency 06/05/2012  . GERD (gastroesophageal reflux disease) 06/03/2012  . Leiomyoma of uterus 10/30/2008  . Type II diabetes mellitus with renal manifestations ([place]) 02/25/2008  . Hyperlipidemia LDL goal <100 02/25/2008  . Morbid obesity ([place]) 02/25/2008  . Essential hypertension 02/25/2008    Luther Hearing, OT Student [phone] 05/03/2017, 4:45 PM  [place] [address] [place], [place], [address] Phone: [phone]   Fax:  [phone]  Name: Kendra Hanson MRN: [id number] Date of Birth: [birth year]   Note reviewed by clinical instructor and accurately reflects treatment session.   Ailene Ravel, OTR/L,CBIS  [phone]

## 2017-05-09 ENCOUNTER — Ambulatory Visit (HOSPITAL_COMMUNITY): Payer: 59

## 2017-05-09 ENCOUNTER — Encounter (HOSPITAL_COMMUNITY): Payer: Self-pay

## 2017-05-09 DIAGNOSIS — M25522 Pain in left elbow: Secondary | ICD-10-CM

## 2017-05-09 DIAGNOSIS — M25632 Stiffness of left wrist, not elsewhere classified: Secondary | ICD-10-CM | POA: Diagnosis not present

## 2017-05-09 DIAGNOSIS — R29898 Other symptoms and signs involving the musculoskeletal system: Secondary | ICD-10-CM | POA: Diagnosis not present

## 2017-05-09 DIAGNOSIS — R278 Other lack of coordination: Secondary | ICD-10-CM | POA: Diagnosis not present

## 2017-05-09 DIAGNOSIS — M25532 Pain in left wrist: Secondary | ICD-10-CM | POA: Diagnosis not present

## 2017-05-09 NOTE — Therapy (Signed)
[place], [place], [address] Phone: [phone]   Fax:  [phone]  Occupational Therapy Treatment  Patient Details  Name: Kendra Hanson MRN: [id number] Date of Birth: August 03, 1961 Referring Provider: Dr. Cyndy Freeze  Encounter Date: 05/09/2017      OT End of Session - 05/09/17 1624    Visit Number 13   Number of Visits 16   Date for OT Re-Evaluation 05/27/17   Authorization Type UMR 24 visits per year   Authorization Time Period yearly   Authorization - Visit Number 13   Authorization - Number of Visits 24   OT Start Time 1520   OT Stop Time 1600   OT Time Calculation (min) 40 min   Activity Tolerance Patient tolerated treatment well   Behavior During Therapy [facility] for tasks assessed/performed      Past Medical History:  Diagnosis Date  . Anemia   . Fibroids    Uterine  . GERD (gastroesophageal reflux disease)   . Hyperlipidemia 2008   Lipid profile in 04/2011:136, 53, 43  . Hypertension 2008   Normal CBC and CMet in 2012; negative stress nuclear in 2006- patient asymptomatic  . Insulin dependent diabetes mellitus ([place]) 1996  . Multiple allergies    perennial  . Normocytic normochromic anemia 12/27/2015  . Obesity   . PONV (postoperative nausea and vomiting)   . Sleep apnea    CPAP    Past Surgical History:  Procedure Laterality Date  . COLONOSCOPY N/A 04/05/2015   Procedure: COLONOSCOPY;  Surgeon: Rogene Houston, MD;  [facility];  Service: Endoscopy;  Laterality: N/A;  730  . DENTAL SURGERY    . ESOPHAGOGASTRODUODENOSCOPY N/A 01/21/2016   Procedure: ESOPHAGOGASTRODUODENOSCOPY (EGD);  Surgeon: Rogene Houston, MD;  Location: [facility];  Service: Endoscopy;  Laterality: N/A;  11:15  . REFRACTIVE SURGERY  2011   Bilateral, two seperate occasions first in 2006  . REMOVAL OF IMPLANT    . RETINAL DETACHMENT SURGERY Bilateral 05/2005    There were no vitals filed for this visit.       Subjective Assessment - 05/09/17 1526    Subjective  S: My hand hurt really bad yesterday but it is feeling better today.   Currently in Pain? Yes   Pain Score 6    Pain Location Hand   Pain Orientation Left   Pain Descriptors / Indicators Tingling   Pain Type Acute pain   Pain Radiating Towards elbow   Pain Onset More than a month ago   Pain Frequency Intermittent   Aggravating Factors  use    Pain Relieving Factors rest   Effect of Pain on Daily Activities min effect   Multiple Pain Sites No            [facility] OT Assessment - 05/09/17 1530      Assessment   Diagnosis S/P Left Carpal Tunnel Release and Ulnar Nerve Decompression   Onset Date 02/07/17     Precautions   Precautions None     Prior Function   Level of Independence Independent     Coordination   Left 9 Hole Peg Test 25.12"  previous: 29.32     ROM / Strength   AROM / PROM / Strength AROM;PROM;Strength     AROM   AROM Assessment Site Elbow;Forearm;Wrist   Right/Left Elbow Left   Left Elbow Flexion 145  previous: 139   Left Elbow Extension 0  same as previous  Left Forearm Pronation 90 Degrees  same as previous   Left Forearm Supination 90 Degrees  same as previous   Left Wrist Extension 44 Degrees  previous: 41   Left Wrist Flexion 54 Degrees  previous: 50     PROM   Overall PROM Comments P/ROM is [facility]      Strength   Strength Assessment Site Elbow;Forearm;Wrist   Right/Left Elbow Left   Left Elbow Flexion 5/5  previous 4/5   Left Elbow Extension 5/5  previous 4-/5   Left Forearm Pronation 4+/5  previous 4-/5   Left Forearm Supination 4+/5  previous 4/5   Left Wrist Flexion 4/5  previous 4-/5   Left Wrist Extension 5/5  previous 4/5   Right/Left hand Left   Left Hand Grip (lbs) 35  previous: 40   Left Hand Lateral Pinch 11 lbs  previous: 7   Left Hand 3 Point Pinch 10 lbs  previous: 4                  OT Treatments/Exercises (OP) - 05/09/17 1552      Exercises    Exercises Elbow;Wrist;Hand     Elbow Exercises   Elbow Flexion Strengthening  12 reps   Bar Weights/Barbell (Elbow Flexion) 2 lbs   Elbow Extension Strengthening  12 reps   Bar Weights/Barbell (Elbow Extension) 2 lbs   Forearm Supination Strengthening  12 reps, 2#   Forearm Pronation Strengthening  12 reps, 2#   Wrist Flexion Strengthening  12 reps   Bar Weights/Barbell (Wrist Flexion) 2 lbs   Wrist Extension Strengthening  12 reps   Bar Weights/Barbell (Wrist Extension) 2 lbs     Hand Exercises   Other Hand Exercises Pt used Eggsercizer eggs to target grip strengthening. Started with the softest, then soft, then to medium, finished with firm. Squeezed each egg 10 times.                OT Education - 05/09/17 1551    Education provided Yes   Education Details Pt educated on measurements taken and progress that has been made and to continue focusing on grip strength at home   Person(s) Educated Patient   Methods Explanation   Comprehension Verbalized understanding          OT Short Term Goals - 05/09/17 1542      OT SHORT TERM GOAL #1   Title Patient will be educated on a HEP for improved function of LUE.     Time 4   Period Weeks   Status Achieved     OT SHORT TERM GOAL #2   Title Patient will be independent with donning and doffing therapist fabricated elbow extenison splint for decreased pressure on ulnar nerve.    Time 4   Period Weeks   Status Achieved     OT SHORT TERM GOAL #3   Title Patient will improve LUE AROM by 5 degrees for improved ability to don and doff shirts.   Time 4   Period Weeks     OT SHORT TERM GOAL #4   Title Patient will improve left arm strength to 4/5 for greater ability to lift titems over head at work.     Time 4   Period Weeks   Status Achieved     OT SHORT TERM GOAL #5   Title Patient will improve her left grip strength by 10# and pinch by 2 pounds for greater ability to open containers with left hand.  Time 4    Period Weeks   Status Partially Met     OT SHORT TERM GOAL #6   Title Patient will improve fine motor coordination as evidence by improving completion time on nine hole peg test by 3".   Time 4   Period Weeks   Status Achieved     OT SHORT TERM GOAL #7   Title Patient will decrease fascial and scar restrictions to moderate in her left arm and hand.     Time 4   Period Weeks   Status On-going           OT Long Term Goals - 05/09/17 1543      OT LONG TERM GOAL #1   Title Patient will return to prior level of independence with all daily tasks.    Time 8   Period Weeks   Status On-going     OT LONG TERM GOAL #2   Title Patient will improve left arm AROM to [facility] for improved ability to reach [place].   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   Time 8   Period Weeks   Status On-going     OT LONG TERM GOAL #4   Title Patient will improve fine motor coordination by decreasimg completion time on nine hole peg test to 26" or less.   Time 8   Period Weeks   Status Achieved     OT LONG TERM GOAL #5   Title Patient will have 5/5 strength in left arm for improved ability to complete work tasks.    Time 8   Period Weeks   Status On-going     OT LONG TERM GOAL #6   Title Patients scar tissue and fascia will have min restrictions for greater mobility and strength in left arm.    Time 8   Period Weeks   Status On-going     OT LONG TERM GOAL #7   Title Patient will have 2/10 pain or less in left arm when completing work duties.    Time 8   Period Weeks   Status On-going     OT LONG TERM GOAL #8   Title Patient will have normal sensation in her left arm and hand for greater safety with ADL completion.    Time 8   Period Weeks   Status On-going               Plan - 05/09/17 1617     Clinical Impression Statement A: Reassessment completed today for doctor's appointment tomorrow. Pt achieved 4 short term goals with 1 on-going and 1 partially met. Pt also achieved 1 long term goal with the rest still on-going. Pt has improved pinch strength and coordination in the left wrist and hand but still needs improvement with grip strength and overall arm strengthening. Pt reports her hand is tingling everyday and she is concerned about it. Encouraged pt to continue with HEP for grip strength and to continue with therapy.   Plan P: Follow up on doctor's appointment. Continue with elbow and wrist strengthening. Focus on grip strength.      Patient will benefit from skilled therapeutic intervention in order to improve the following deficits and impairments:  Decreased coordination, Decreased range of motion,  Decreased scar mobility, Decreased strength, Increased fascial restricitons, Impaired UE functional use, Pain  Visit Diagnosis: Pain in left wrist  Pain in left elbow  Stiffness of left wrist, not elsewhere classified    Problem List Patient Active Problem List   Diagnosis Date Noted  . Upper extremity pain, diffuse, left 01/26/2017  . Osteoarthritis of both knees 12/27/2015  . Normocytic normochromic anemia 12/27/2015  . Carpal tunnel syndrome 07/12/2015  . Bilateral leg numbness 07/12/2015  . Pigmented skin lesion 05/06/2015  . Iron deficiency 03/14/2015  . Seasonal allergies 02/01/2015  . Piles (hemorrhoids) 09/25/2013  . Sleep apnea 06/13/2013  . Vitamin D deficiency 06/05/2012  . GERD (gastroesophageal reflux disease) 06/03/2012  . Leiomyoma of uterus 10/30/2008  . Type II diabetes mellitus with renal manifestations ([place]) 02/25/2008  . Hyperlipidemia LDL goal <100 02/25/2008  . Morbid obesity ([place]) 02/25/2008  . Essential hypertension 02/25/2008    Luther Hearing, OT Student [phone] 05/09/2017, 4:26 PM  [place] [address] [place], [place], [address] Phone: [phone]   Fax:  [phone]  Name: Kendra Hanson MRN: [id number] Date of Birth: Jan 25, 1961   Note reviewed by clinical instructor and accurately reflects treatment session.   Ailene Ravel, OTR/L,CBIS  [phone]

## 2017-05-10 ENCOUNTER — Encounter (HOSPITAL_COMMUNITY): Payer: 59

## 2017-05-10 MED FILL — ACCU-CHEK FASTCLIX LANCETS: 30 days supply | Qty: 102 | Fill #2

## 2017-05-10 MED FILL — LANTUS SOLOSTAR 100 UNITS/M: 100 | 75 days supply | Qty: 45 | Fill #2

## 2017-05-10 MED FILL — ACCU-CHEK GUIDE TEST STRIP: 50 days supply | Qty: 150 | Fill #2

## 2017-05-12 ENCOUNTER — Telehealth: Payer: 59 | Admitting: Nurse Practitioner

## 2017-05-12 DIAGNOSIS — R059 Cough, unspecified: Secondary | ICD-10-CM

## 2017-05-12 DIAGNOSIS — R05 Cough: Secondary | ICD-10-CM | POA: Diagnosis not present

## 2017-05-12 MED ORDER — AZITHROMYCIN 250 MG PO TABS: ORAL_TABLET | ORAL | 0 refills | Status: DC

## 2017-05-12 NOTE — Progress Notes (Signed)
We are sorry that you are not feeling well.  Here is how we plan to help!  Based on your presentation I believe you most likely have A cough due to bacteria.  When patients have a fever and a productive cough with a change in color or increased sputum production, we are concerned about bacterial bronchitis.  If left untreated it can progress to pneumonia.  If your symptoms do not improve with your treatment plan it is important that you contact your provider.   I have prescribed Azithromyin 250 mg: two tables now and then one tablet daily for 4 additonal days    In addition you may use A non-prescription cough medication called Mucinex DM: take 2 tablets every 12 hours.    From your responses in the eVisit questionnaire you describe inflammation in the upper respiratory tract which is causing a significant cough.  This is commonly called Bronchitis and has four common causes:    Allergies  Viral Infections  Acid Reflux  Bacterial Infection Allergies, viruses and acid reflux are treated by controlling symptoms or eliminating the cause. An example might be a cough caused by taking certain blood pressure medications. You stop the cough by changing the medication. Another example might be a cough caused by acid reflux. Controlling the reflux helps control the cough.  USE OF BRONCHODILATOR ("RESCUE") INHALERS: There is a risk from using your bronchodilator too frequently.  The risk is that over-reliance on a medication which only relaxes the muscles surrounding the breathing tubes can reduce the effectiveness of medications prescribed to reduce swelling and congestion of the tubes themselves.  Although you feel brief relief from the bronchodilator inhaler, your asthma may actually be worsening with the tubes becoming more swollen and filled with mucus.  This can delay other crucial treatments, such as oral steroid medications. If you need to use a bronchodilator inhaler daily, several times per day,  you should discuss this with your provider.  There are probably better treatments that could be used to keep your asthma under control.     HOME CARE . Only take medications as instructed by your medical team. . Complete the entire course of an antibiotic. . Drink plenty of fluids and get plenty of rest. . Avoid close contacts especially the very young and the elderly . Cover your mouth if you cough or cough into your sleeve. . Always remember to wash your hands . A steam or ultrasonic humidifier can help congestion.   GET HELP RIGHT AWAY IF: . You develop worsening fever. . You become short of breath . You cough up blood. . Your symptoms persist after you have completed your treatment plan MAKE SURE YOU   Understand these instructions.  Will watch your condition.  Will get help right away if you are not doing well or get worse.  Your e-visit answers were reviewed by a board certified advanced clinical practitioner to complete your personal care plan.  Depending on the condition, your plan could have included both over the counter or prescription medications. If there is a problem please reply  once you have received a response from your provider. Your safety is important to us.  If you have drug allergies check your prescription carefully.    You can use MyChart to ask questions about today's visit, request a non-urgent call back, or ask for a work or school excuse for 24 hours related to this e-Visit. If it has been greater than 24 hours you will need   to follow up with your provider, or enter a new e-Visit to address those concerns. You will get an e-mail in the next two days asking about your experience.  I hope that your e-visit has been valuable and will speed your recovery. Thank you for using e-visits.   

## 2017-05-22 ENCOUNTER — Ambulatory Visit (HOSPITAL_COMMUNITY): Payer: 59 | Admitting: Specialist

## 2017-05-23 ENCOUNTER — Other Ambulatory Visit: Payer: Self-pay | Admitting: Family Medicine

## 2017-05-23 MED FILL — LOSARTAN POTASSIUM 100 MG T: 100 | 90 days supply | Qty: 90 | Fill #0

## 2017-05-23 MED FILL — VICTOZA 18 MG/3 ML INJECT P: 18 | 30 days supply | Qty: 9 | Fill #5

## 2017-05-23 MED FILL — metFORMIN HCL 1000 MG TABS: 1000 | 90 days supply | Qty: 180 | Fill #0 | Status: TO

## 2017-05-23 MED FILL — PROGESTERONE 100 MG CAPSULE: 100 | 30 days supply | Qty: 30 | Fill #5

## 2017-05-24 ENCOUNTER — Ambulatory Visit (HOSPITAL_COMMUNITY): Payer: 59 | Attending: Neurological Surgery | Admitting: Occupational Therapy

## 2017-05-24 ENCOUNTER — Encounter (HOSPITAL_COMMUNITY): Payer: Self-pay | Admitting: Occupational Therapy

## 2017-05-24 DIAGNOSIS — M25632 Stiffness of left wrist, not elsewhere classified: Secondary | ICD-10-CM | POA: Insufficient documentation

## 2017-05-24 DIAGNOSIS — R278 Other lack of coordination: Secondary | ICD-10-CM | POA: Insufficient documentation

## 2017-05-24 DIAGNOSIS — R29898 Other symptoms and signs involving the musculoskeletal system: Secondary | ICD-10-CM | POA: Insufficient documentation

## 2017-05-24 DIAGNOSIS — M25532 Pain in left wrist: Secondary | ICD-10-CM | POA: Insufficient documentation

## 2017-05-24 DIAGNOSIS — M25522 Pain in left elbow: Secondary | ICD-10-CM | POA: Insufficient documentation

## 2017-05-24 NOTE — Therapy (Signed)
[place], [place], [address] Phone: [phone]   Fax:  [phone]  Occupational Therapy Treatment  Patient Details  Name: Kendra Hanson MRN: [id number] Date of Birth: [birth year] Referring Provider: Dr. Cyndy Freeze  Encounter Date: 05/24/2017      OT End of Session - 05/24/17 1514    Visit Number 14   Number of Visits 20   Date for OT Re-Evaluation 06/08/17   Authorization Type UMR 24 visits per year   Authorization Time Period yearly   Authorization - Visit Number 14   Authorization - Number of Visits 24   OT Start Time 1439  Pt arrived late to session   OT Stop Time 1515   OT Time Calculation (min) 36 min   Activity Tolerance Patient tolerated treatment well   Behavior During Therapy [facility] for tasks assessed/performed      Past Medical History:  Diagnosis Date  . Anemia   . Fibroids    Uterine  . GERD (gastroesophageal reflux disease)   . Hyperlipidemia 2008   Lipid profile in 04/2011:136, 53, 43  . Hypertension 2008   Normal CBC and CMet in 2012; negative stress nuclear in 2006- patient asymptomatic  . Insulin dependent diabetes mellitus ([place]) 1996  . Multiple allergies    perennial  . Normocytic normochromic anemia 12/27/2015  . Obesity   . PONV (postoperative nausea and vomiting)   . Sleep apnea    CPAP    Past Surgical History:  Procedure Laterality Date  . COLONOSCOPY N/A 04/05/2015   Procedure: COLONOSCOPY;  Surgeon: Rogene Houston, MD;  [facility];  Service: Endoscopy;  Laterality: N/A;  730  . DENTAL SURGERY    . ESOPHAGOGASTRODUODENOSCOPY N/A 01/21/2016   Procedure: ESOPHAGOGASTRODUODENOSCOPY (EGD);  Surgeon: Rogene Houston, MD;  [facility];  Service: Endoscopy;  Laterality: N/A;  11:15  . REFRACTIVE SURGERY  2011   Bilateral, two seperate occasions first in 2006  . REMOVAL OF IMPLANT    . RETINAL DETACHMENT SURGERY Bilateral 05/2005    There were no vitals  filed for this visit.      Subjective Assessment - 05/24/17 1511    Subjective  S: I'm having a lot of cramps in this hand.   Currently in Pain? Yes   Pain Score 7    Pain Location Hand   Pain Orientation Left   Pain Descriptors / Indicators Tingling;Numbness   Pain Type Acute pain   Pain Radiating Towards elbow   Pain Frequency Intermittent   Aggravating Factors  use   Pain Relieving Factors rest   Effect of Pain on Daily Activities min effect   Multiple Pain Sites No            [facility] OT Assessment - 05/24/17 1443      Assessment   Diagnosis S/P Left Carpal Tunnel Release and Ulnar Nerve Decompression     Precautions   Precautions None     Coordination   Left 9 Hole Peg Test 22.01  25.12" previous     AROM   AROM Assessment Site Elbow;Forearm;Wrist   Right/Left Elbow Left   Left Elbow Flexion 145  previous: 145   Left Elbow Extension 0  previous: 0   Left Forearm Pronation 90 Degrees  previous: 90   Left Forearm Supination 90 Degrees  previous: 90   Left Wrist Extension 60 Degrees  previous: 44   Left Wrist Flexion 55 Degrees  previous: 54  PROM   Overall PROM Comments P/ROM is [facility]      Strength   Strength Assessment Site Elbow;Forearm;Wrist   Right/Left Elbow Left   Left Elbow Flexion 5/5  same as previous   Left Elbow Extension 5/5  same as previous   Left Forearm Pronation 5/5  previous: 4+/5   Left Forearm Supination 5/5  previous: 4+/5   Left Wrist Flexion 4/5  same as previous   Left Wrist Extension 5/5  same as previous   Left Hand Grip (lbs) 24  35 previous   Left Hand Lateral Pinch 10 lbs  11 previous   Left Hand 3 Point Pinch 9 lbs  10 previous              OT Treatments/Exercises (OP) - 05/24/17 1504      Exercises   Exercises Elbow;Wrist;Hand     Additional Elbow Exercises   Hand Gripper with Large Beads all beads with gripper set at 25#   Hand Gripper with Medium Beads all beads with gripper set at 25#    Hand Gripper with Small Beads all beads with gripper set at 25#                OT Education - 05/24/17 1513    Education provided Yes   Education Details discussed continued need for therapy with pt as well as pain management   Person(s) Educated Patient   Methods Explanation   Comprehension Verbalized understanding          OT Short Term Goals - 05/24/17 1459      OT SHORT TERM GOAL #1   Title Patient will be educated on a HEP for improved function of LUE.     Time 4   Period Weeks   Status Achieved     OT SHORT TERM GOAL #2   Title Patient will be independent with donning and doffing therapist fabricated elbow extenison splint for decreased pressure on ulnar nerve.    Time 4   Period Weeks   Status Achieved     OT SHORT TERM GOAL #3   Title Patient will improve LUE AROM by 5 degrees for improved ability to don and doff shirts.   Time 4   Period Weeks     OT SHORT TERM GOAL #4   Title Patient will improve left arm strength to 4/5 for greater ability to lift titems over head at work.     Time 4   Period Weeks   Status Achieved     OT SHORT TERM GOAL #5   Title Patient will improve her left grip strength by 10# and pinch by 2 pounds for greater ability to open containers with left hand.   Time 4   Period Weeks   Status Partially Met     OT SHORT TERM GOAL #6   Title Patient will improve fine motor coordination as evidence by improving completion time on nine hole peg test by 3".   Time 4   Period Weeks   Status Achieved     OT SHORT TERM GOAL #7   Title Patient will decrease fascial and scar restrictions to moderate in her left arm and hand.     Time 4   Period Weeks   Status On-going           OT Long Term Goals - 05/24/17 1501      OT LONG TERM GOAL #1   Title Patient will return to prior level of  independence with all daily tasks.    Time 8   Period Weeks   Status On-going     OT LONG TERM GOAL #2   Title Patient will improve left arm  AROM to [facility] for improved ability to reach [place].   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   Time 8   Period Weeks   Status On-going     OT LONG TERM GOAL #4   Title Patient will improve fine motor coordination by decreasimg completion time on nine hole peg test to 26" or less.   Time 8   Period Weeks   Status Achieved     OT LONG TERM GOAL #5   Title Patient will have 5/5 strength in left arm for improved ability to complete work tasks.    Time 8   Period Weeks   Status Partially Met     OT LONG TERM GOAL #6   Title Patients scar tissue and fascia will have min restrictions for greater mobility and strength in left arm.    Time 8   Period Weeks   Status On-going     OT LONG TERM GOAL #7   Title Patient will have 2/10 pain or less in left arm when completing work duties.    Time 8   Period Weeks   Status On-going     OT LONG TERM GOAL #8   Title Patient will have normal sensation in her left arm and hand for greater safety with ADL completion.    Time 8   Period Weeks   Status Deferred               Plan - 05/24/17 1525    Clinical Impression Statement A: Session focused on reassessment. Pt achieved 2 short term goals with four still on-going as well as achieved two long term goals with one partially met and four still on-going. Pt improved LUE A/ROM and strength but still needs to improve grip and pinch strength of the left hand. Pt reports tingling and numbness in her elbow and hand causing her to need many rest breaks when completing activities.  Pt completed hand gripper, decreasing the gripper weight and needed VC for form and technique.  Pt reports 8/10 pain however OT notes pt turning door handle, gripping phone and completing activities with left hand with no problem after  session. Pt has not been to therapy since 05/09/17. Pt will receive therapy for 2 more weeks, twice a week.   OT Frequency 2x / week   OT Duration 2 weeks   Plan P: Attempt cold laser therapy on elbow for pain. Continue with wrist flexion/extension exercises as well as grip and pinch strengthening.       Patient will benefit from skilled therapeutic intervention in order to improve the following deficits and impairments:  Decreased coordination, Decreased range of motion, Decreased scar mobility, Decreased strength, Increased fascial restricitons, Impaired UE functional use, Pain  Visit Diagnosis: Pain in left wrist  Pain in left elbow  Stiffness of left wrist, not elsewhere classified  Other symptoms and signs involving the musculoskeletal system  Other lack of coordination    Problem List Patient Active Problem List   Diagnosis Date Noted  . Upper extremity pain,  diffuse, left 01/26/2017  . Osteoarthritis of both knees 12/27/2015  . Normocytic normochromic anemia 12/27/2015  . Carpal tunnel syndrome 07/12/2015  . Bilateral leg numbness 07/12/2015  . Pigmented skin lesion 05/06/2015  . Iron deficiency 03/14/2015  . Seasonal allergies 02/01/2015  . Piles (hemorrhoids) 09/25/2013  . Sleep apnea 06/13/2013  . Vitamin D deficiency 06/05/2012  . GERD (gastroesophageal reflux disease) 06/03/2012  . Leiomyoma of uterus 10/30/2008  . Type II diabetes mellitus with renal manifestations ([place]) 02/25/2008  . Hyperlipidemia LDL goal <100 02/25/2008  . Morbid obesity ([place]) 02/25/2008  . Essential hypertension 02/25/2008    Luther Hearing, OT Student [phone] 05/24/2017, 3:39 PM  [facility] [address] [place], [place], [address] Phone: [phone]   Fax:  [phone]  Name: Kendra Hanson MRN: [id number] Date of Birth: [birth year]   Note reviewed by qualified practitioner and accurately reflects treatment session.  Guadelupe Sabin, OTR/L  [phone] 05/24/2017

## 2017-05-25 ENCOUNTER — Encounter (HOSPITAL_COMMUNITY): Payer: Self-pay | Admitting: Occupational Therapy

## 2017-05-25 ENCOUNTER — Ambulatory Visit (HOSPITAL_COMMUNITY): Payer: 59 | Admitting: Occupational Therapy

## 2017-05-25 DIAGNOSIS — R29898 Other symptoms and signs involving the musculoskeletal system: Secondary | ICD-10-CM

## 2017-05-25 DIAGNOSIS — M25632 Stiffness of left wrist, not elsewhere classified: Secondary | ICD-10-CM | POA: Diagnosis not present

## 2017-05-25 DIAGNOSIS — M25522 Pain in left elbow: Secondary | ICD-10-CM | POA: Diagnosis not present

## 2017-05-25 DIAGNOSIS — M25532 Pain in left wrist: Secondary | ICD-10-CM

## 2017-05-25 DIAGNOSIS — R278 Other lack of coordination: Secondary | ICD-10-CM | POA: Diagnosis not present

## 2017-05-25 NOTE — Therapy (Signed)
[place], [place], [address] Phone: [phone]   Fax:  [phone]  Occupational Therapy Treatment  Patient Details  Name: Kendra Hanson MRN: [id number] Date of Birth: [birth year] Referring Provider: Dr. Cyndy Freeze  Encounter Date: 05/25/2017      OT End of Session - 05/25/17 1137    Visit Number 15   Number of Visits 20   Date for OT Re-Evaluation 06/08/17   Authorization Type UMR 24 visits per year   Authorization Time Period yearly   Authorization - Visit Number 15   Authorization - Number of Visits 24   OT Start Time 7517   OT Stop Time 1114   OT Time Calculation (min) 42 min   Activity Tolerance Patient tolerated treatment well   Behavior During Therapy [facility] for tasks assessed/performed      Past Medical History:  Diagnosis Date  . Anemia   . Fibroids    Uterine  . GERD (gastroesophageal reflux disease)   . Hyperlipidemia 2008   Lipid profile in 04/2011:136, 53, 43  . Hypertension 2008   Normal CBC and CMet in 2012; negative stress nuclear in 2006- patient asymptomatic  . Insulin dependent diabetes mellitus ([place]) 1996  . Multiple allergies    perennial  . Normocytic normochromic anemia 12/27/2015  . Obesity   . PONV (postoperative nausea and vomiting)   . Sleep apnea    CPAP    Past Surgical History:  Procedure Laterality Date  . COLONOSCOPY N/A 04/05/2015   Procedure: COLONOSCOPY;  Surgeon: Rogene Houston, MD;  [facility];  Service: Endoscopy;  Laterality: N/A;  730  . DENTAL SURGERY    . ESOPHAGOGASTRODUODENOSCOPY N/A 01/21/2016   Procedure: ESOPHAGOGASTRODUODENOSCOPY (EGD);  Surgeon: Rogene Houston, MD;  [facility];  Service: Endoscopy;  Laterality: N/A;  11:15  . REFRACTIVE SURGERY  2011   Bilateral, two seperate occasions first in 2006  . REMOVAL OF IMPLANT    . RETINAL DETACHMENT SURGERY Bilateral 05/2005    There were no vitals filed for this visit.       Subjective Assessment - 05/25/17 1032    Subjective  S: I tried to fold some clothes this morning.    Currently in Pain? No/denies            [facility] OT Assessment - 05/25/17 1032      Assessment   Diagnosis S/P Left Carpal Tunnel Release and Ulnar Nerve Decompression     Precautions   Precautions None                  OT Treatments/Exercises (OP) - 05/25/17 1034      Exercises   Exercises Elbow;Wrist;Hand     Additional Elbow Exercises   Theraputty Flatten   Theraputty - Flatten red-focusing on wrist extension   Hand Gripper with Large Beads all beads with gripper set at 29#   Hand Gripper with Medium Beads all beads with gripper set at 29#   Hand Gripper with Small Beads all beads with gripper set at 29#     Fine Motor Coordination   Tendon Glides 10X     Additional Wrist Exercises   Sponges Pt used red clothespin with lateral pinch to move 20 sponges to opposite side of table. Pt then used 3 point pinch to place 20 sponges into bucket. Min difficulty. Pt attempted 3 point with green clothespin, placed 12 sponges into bucket with rest breaks  Manual Therapy   Manual Therapy Myofascial release   Manual therapy comments manual therapy completed seperately from all other interventions this date   Myofascial Release myofascial release and manual stretching to left elbow region, forearm, wrist, and hand to decrease pain and restrictions and improve pain free mobility in left arm.  Scar release to both surgical scars to decreae pliability and decrease restrictions                 OT Education - 05/24/17 1513    Education provided Yes   Education Details discussed continued need for therapy with pt as well as pain management   Person(s) Educated Patient   Methods Explanation   Comprehension Verbalized understanding          OT Short Term Goals - 05/24/17 1459      OT SHORT TERM GOAL #1   Title Patient will be educated on a HEP for improved  function of LUE.     Time 4   Period Weeks   Status Achieved     OT SHORT TERM GOAL #2   Title Patient will be independent with donning and doffing therapist fabricated elbow extenison splint for decreased pressure on ulnar nerve.    Time 4   Period Weeks   Status Achieved     OT SHORT TERM GOAL #3   Title Patient will improve LUE AROM by 5 degrees for improved ability to don and doff shirts.   Time 4   Period Weeks     OT SHORT TERM GOAL #4   Title Patient will improve left arm strength to 4/5 for greater ability to lift titems over head at work.     Time 4   Period Weeks   Status Achieved     OT SHORT TERM GOAL #5   Title Patient will improve her left grip strength by 10# and pinch by 2 pounds for greater ability to open containers with left hand.   Time 4   Period Weeks   Status Partially Met     OT SHORT TERM GOAL #6   Title Patient will improve fine motor coordination as evidence by improving completion time on nine hole peg test by 3".   Time 4   Period Weeks   Status Achieved     OT SHORT TERM GOAL #7   Title Patient will decrease fascial and scar restrictions to moderate in her left arm and hand.     Time 4   Period Weeks   Status On-going           OT Long Term Goals - 05/24/17 1501      OT LONG TERM GOAL #1   Title Patient will return to prior level of independence with all daily tasks.    Time 8   Period Weeks   Status On-going     OT LONG TERM GOAL #2   Title Patient will improve left arm AROM to [facility] for improved ability to reach [place].   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   Time 8   Period Weeks   Status On-going     OT LONG TERM GOAL #4   Title Patient will improve fine motor coordination by decreasimg completion time on  nine hole peg test to 26" or  less.   Time 8   Period Weeks   Status Achieved     OT LONG TERM GOAL #5   Title Patient will have 5/5 strength in left arm for improved ability to complete work tasks.    Time 8   Period Weeks   Status Partially Met     OT LONG TERM GOAL #6   Title Patients scar tissue and fascia will have min restrictions for greater mobility and strength in left arm.    Time 8   Period Weeks   Status On-going     OT LONG TERM GOAL #7   Title Patient will have 2/10 pain or less in left arm when completing work duties.    Time 8   Period Weeks   Status On-going     OT LONG TERM GOAL #8   Title Patient will have normal sensation in her left arm and hand for greater safety with ADL completion.    Time 8   Period Weeks   Status Deferred               Plan - 05/25/17 1137    Clinical Impression Statement A: Manual therapy completed this session, session focusing on grip and pinch strengthening. Pt able to increase gripper to 29# for all size beads, rest breaks for fatigue. Pinch strengthening completed with red and green clothespins. Verbal cuing for positioning with pinch strengthening tasks. Laser unavailable this session.    Plan P: Continue with grip and pinch strengthening, increasing hand gripper resistance. Laser at beginning or end of session if available   OT Home Exercise Plan 03/29/17:  tendon glides and A/ROM of wrist, elbow, hand.    Consulted and Agree with Plan of Care Patient      Patient will benefit from skilled therapeutic intervention in order to improve the following deficits and impairments:  Decreased coordination, Decreased range of motion, Decreased scar mobility, Decreased strength, Increased fascial restricitons, Impaired UE functional use, Pain  Visit Diagnosis: Pain in left wrist  Pain in left elbow  Other symptoms and signs involving the musculoskeletal system    Problem List Patient Active Problem List   Diagnosis Date Noted  . Upper extremity  pain, diffuse, left 01/26/2017  . Osteoarthritis of both knees 12/27/2015  . Normocytic normochromic anemia 12/27/2015  . Carpal tunnel syndrome 07/12/2015  . Bilateral leg numbness 07/12/2015  . Pigmented skin lesion 05/06/2015  . Iron deficiency 03/14/2015  . Seasonal allergies 02/01/2015  . Piles (hemorrhoids) 09/25/2013  . Sleep apnea 06/13/2013  . Vitamin D deficiency 06/05/2012  . GERD (gastroesophageal reflux disease) 06/03/2012  . Leiomyoma of uterus 10/30/2008  . Type II diabetes mellitus with renal manifestations ([place]) 02/25/2008  . Hyperlipidemia LDL goal <100 02/25/2008  . Morbid obesity ([place]) 02/25/2008  . Essential hypertension 02/25/2008   Guadelupe Sabin, OTR/L  [phone] 05/25/2017, 11:42 AM  [place] [address] [place], [place], [address] Phone: [phone]   Fax:  [phone]  Name: Kendra Hanson MRN: [id number] Date of Birth: 1961-05-05

## 2017-05-29 ENCOUNTER — Encounter (HOSPITAL_COMMUNITY): Payer: 59

## 2017-05-31 ENCOUNTER — Encounter (HOSPITAL_COMMUNITY): Payer: 59

## 2017-06-04 ENCOUNTER — Encounter (HOSPITAL_COMMUNITY): Payer: 59 | Admitting: Specialist

## 2017-06-05 ENCOUNTER — Telehealth: Payer: Self-pay

## 2017-06-05 ENCOUNTER — Other Ambulatory Visit: Payer: Self-pay | Admitting: Family Medicine

## 2017-06-05 MED ORDER — CYCLOBENZAPRINE HCL 10 MG PO TABS: 10.0000 mg | ORAL_TABLET | Freq: Three times a day (TID) | ORAL | 0 refills | Status: DC | PRN

## 2017-06-05 MED FILL — CYCLOBENZAPRINE 10 MG TAB: 10 | 10 days supply | Qty: 30 | Fill #0

## 2017-06-05 MED FILL — FUROSEMIDE 20 MG TABLET: 20 | 90 days supply | Qty: 270 | Fill #3

## 2017-06-05 MED FILL — HUMALOG MIX 75-25 KWIKPEN: (75-25) 100 | 88 days supply | Qty: 30 | Fill #2

## 2017-06-05 MED FILL — ESTRADIOL PATCH 0.0375: 0.0375 | 28 days supply | Qty: 8 | Fill #10

## 2017-06-05 NOTE — Telephone Encounter (Signed)
Flexeril is sent to [facility] out pt, pls send to an alternate pharmacy if she prefers, and let her know

## 2017-06-05 NOTE — Progress Notes (Signed)
Flexeril

## 2017-06-05 NOTE — Telephone Encounter (Signed)
Is having right leg pain in her calf area for a few days now. (no redness or warmth  Or discoloration) Patient states it just feels just a pulled muscle or a cramp and it hurts to walk or stretch it out. Wants something for it

## 2017-06-05 NOTE — Telephone Encounter (Signed)
Patient aware.

## 2017-06-06 ENCOUNTER — Encounter (HOSPITAL_COMMUNITY): Payer: Self-pay | Admitting: Occupational Therapy

## 2017-06-06 ENCOUNTER — Ambulatory Visit (HOSPITAL_COMMUNITY): Payer: 59 | Admitting: Occupational Therapy

## 2017-06-06 DIAGNOSIS — R29898 Other symptoms and signs involving the musculoskeletal system: Secondary | ICD-10-CM | POA: Diagnosis not present

## 2017-06-06 DIAGNOSIS — M25532 Pain in left wrist: Secondary | ICD-10-CM

## 2017-06-06 DIAGNOSIS — M25522 Pain in left elbow: Secondary | ICD-10-CM

## 2017-06-06 DIAGNOSIS — R278 Other lack of coordination: Secondary | ICD-10-CM | POA: Diagnosis not present

## 2017-06-06 DIAGNOSIS — M25632 Stiffness of left wrist, not elsewhere classified: Secondary | ICD-10-CM | POA: Diagnosis not present

## 2017-06-06 NOTE — Therapy (Signed)
[place], [place], [address] Phone: [phone]   Fax:  [phone]  Occupational Therapy Reassessment, Treatment, and Discharge  Patient Details  Name: Kendra Hanson MRN: [id number] Date of Birth: August 28, 1961 Referring Provider: Dr. Cyndy Freeze  Encounter Date: 06/06/2017      OT End of Session - 06/06/17 1508    Visit Number 16   Number of Visits 20   Date for OT Re-Evaluation 06/08/17   Authorization Type UMR 24 visits per year   Authorization Time Period yearly   Authorization - Visit Number 16   Authorization - Number of Visits 24   OT Start Time 6503   OT Stop Time 1505   OT Time Calculation (min) 33 min   Activity Tolerance Patient tolerated treatment well   Behavior During Therapy [facility] for tasks assessed/performed      Past Medical History:  Diagnosis Date  . Anemia   . Fibroids    Uterine  . GERD (gastroesophageal reflux disease)   . Hyperlipidemia 2008   Lipid profile in 04/2011:136, 53, 43  . Hypertension 2008   Normal CBC and CMet in 2012; negative stress nuclear in 2006- patient asymptomatic  . Insulin dependent diabetes mellitus ([place]) 1996  . Multiple allergies    perennial  . Normocytic normochromic anemia 12/27/2015  . Obesity   . PONV (postoperative nausea and vomiting)   . Sleep apnea    CPAP    Past Surgical History:  Procedure Laterality Date  . COLONOSCOPY N/A 04/05/2015   Procedure: COLONOSCOPY;  Surgeon: Rogene Houston, MD;  [facility];  Service: Endoscopy;  Laterality: N/A;  730  . DENTAL SURGERY    . ESOPHAGOGASTRODUODENOSCOPY N/A 01/21/2016   Procedure: ESOPHAGOGASTRODUODENOSCOPY (EGD);  Surgeon: Rogene Houston, MD;  [facility];  Service: Endoscopy;  Laterality: N/A;  11:15  . REFRACTIVE SURGERY  2011   Bilateral, two seperate occasions first in 2006  . REMOVAL OF IMPLANT    . RETINAL DETACHMENT SURGERY Bilateral 05/2005    There were no vitals  filed for this visit.      Subjective Assessment - 06/06/17 1431    Subjective  S: It was aching on Saturday after I used it a lot.    Currently in Pain? No/denies            [facility] OT Assessment - 06/06/17 1431      Assessment   Diagnosis S/P Left Carpal Tunnel Release and Ulnar Nerve Decompression     Precautions   Precautions None     AROM   AROM Assessment Site Wrist   Right/Left Elbow Left   Left Wrist Extension 60 Degrees  same as previous   Left Wrist Flexion 65 Degrees  55 previous     Strength   Strength Assessment Site Hand   Right/Left hand Left   Left Hand Grip (lbs) 45  24 previous   Left Hand Lateral Pinch 10 lbs  same as previous   Left Hand 3 Point Pinch 10 lbs  9 previous                  OT Treatments/Exercises (OP) - 06/06/17 1449      Exercises   Exercises Elbow;Wrist;Hand     Additional Elbow Exercises   Hand Gripper with Large Beads all beads with gripper set at 35#   Hand Gripper with Medium Beads all beads with gripper set at 35#   Hand  Gripper with Small Beads 7/15 beads with gripper set at 35#     Additional Wrist Exercises   Sponges Pt used lateral pinch and blue clothespin to grasp and stack high resistance sponges, unable to open clothespin all the way, instead grasping corners of sponges. Pt then used 3 point pinch and green clothespin to place 16 sponges into bucket     Hand Exercises   Other Hand Exercises pt placed blue and black clothespins on pinch tree with 3 point pinch, removed with lateral pinch. Min difficulty     Manual Therapy   Manual Therapy Myofascial release   Manual therapy comments manual therapy completed seperately from all other interventions this date   Myofascial Release myofascial release and manual stretching to left elbow region, forearm, wrist, and hand to decrease pain and restrictions and improve pain free mobility in left arm.  Scar release to both surgical scars to decreae pliability and  decrease restrictions                 OT Education - 06/06/17 1507    Education provided Yes   Education Details Discussed goals met and progress, as well as discharge   Person(s) Educated Patient   Methods Explanation   Comprehension Verbalized understanding          OT Short Term Goals - 06/06/17 1437      OT SHORT TERM GOAL #1   Title Patient will be educated on a HEP for improved function of LUE.     Time 4   Period Weeks   Status Achieved     OT SHORT TERM GOAL #2   Title Patient will be independent with donning and doffing therapist fabricated elbow extenison splint for decreased pressure on ulnar nerve.    Time 4   Period Weeks   Status Achieved     OT SHORT TERM GOAL #3   Title Patient will improve LUE AROM by 5 degrees for improved ability to don and doff shirts.   Time 4   Period Weeks     OT SHORT TERM GOAL #4   Title Patient will improve left arm strength to 4/5 for greater ability to lift titems over head at work.     Time 4   Period Weeks   Status Achieved     OT SHORT TERM GOAL #5   Title Patient will improve her left grip strength by 10# and pinch by 2 pounds for greater ability to open containers with left hand.   Time 4   Period Weeks   Status Achieved     OT SHORT TERM GOAL #6   Title Patient will improve fine motor coordination as evidence by improving completion time on nine hole peg test by 3".   Time 4   Period Weeks   Status Achieved     OT SHORT TERM GOAL #7   Title Patient will decrease fascial and scar restrictions to moderate in her left arm and hand.     Time 4   Period Weeks   Status Achieved           OT Long Term Goals - 06/06/17 1438      OT LONG TERM GOAL #1   Title Patient will return to prior level of independence with all daily tasks.    Time 8   Period Weeks   Status Achieved     OT LONG TERM GOAL #2   Title Patient will improve left arm AROM  to [facility] for improved ability to reach [place].   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   Time 8   Period Weeks   Status Not Met     OT LONG TERM GOAL #4   Title Patient will improve fine motor coordination by decreasimg completion time on nine hole peg test to 26" or less.   Time 8   Period Weeks   Status Achieved     OT LONG TERM GOAL #5   Title Patient will have 5/5 strength in left arm for improved ability to complete work tasks.    Time 8   Period Weeks   Status Achieved     OT LONG TERM GOAL #6   Title Patients scar tissue and fascia will have min restrictions for greater mobility and strength in left arm.    Time 8   Period Weeks   Status Achieved     OT LONG TERM GOAL #7   Title Patient will have 2/10 pain or less in left arm when completing work duties.    Time 8   Period Weeks   Status Not Met     OT LONG TERM GOAL #8   Title Patient will have normal sensation in her left arm and hand for greater safety with ADL completion.    Time 8   Period Weeks   Status Deferred               Plan - 06/06/17 1509    Clinical Impression Statement A: Reassesssment completed this session, pt has only attended one appointment since previous reassessment. Pt has met all STGs and 5/7 LTGs, progressing well with grip strength since last reassessment. Pt's primary concern now is her sensation and pain experienced after using her left arm for daily tasks. Educated pt on strategies to minimize pain and encouraged continued use of the LUE to continue building strength and endurance. Pt is agreeable to discharge this session and continue HEP.    Plan P: Discharge pt   OT Home Exercise Plan 03/29/17:  tendon glides and A/ROM of wrist, elbow, hand.    Consulted and Agree with Plan of Care Patient      Patient will benefit from skilled therapeutic  intervention in order to improve the following deficits and impairments:  Decreased coordination, Decreased range of motion, Decreased scar mobility, Decreased strength, Increased fascial restricitons, Impaired UE functional use, Pain  Visit Diagnosis: Pain in left wrist  Other symptoms and signs involving the musculoskeletal system  Pain in left elbow    Problem List Patient Active Problem List   Diagnosis Date Noted  . Upper extremity pain, diffuse, left 01/26/2017  . Osteoarthritis of both knees 12/27/2015  . Normocytic normochromic anemia 12/27/2015  . Carpal tunnel syndrome 07/12/2015  . Bilateral leg numbness 07/12/2015  . Pigmented skin lesion 05/06/2015  . Iron deficiency 03/14/2015  . Seasonal allergies 02/01/2015  . Piles (hemorrhoids) 09/25/2013  . Sleep apnea 06/13/2013  . Vitamin D deficiency 06/05/2012  . GERD (gastroesophageal reflux disease) 06/03/2012  . Leiomyoma of uterus 10/30/2008  . Type II diabetes mellitus with renal manifestations ([place]) 02/25/2008  . Hyperlipidemia LDL goal <100 02/25/2008  . Morbid obesity ([place]) 02/25/2008  . Essential hypertension 02/25/2008  Guadelupe Sabin, OTR/L  [phone] 06/06/2017, 3:12 PM  [place] [place] [address] [place], [place], [address] Phone: [phone]   Fax:  [phone]  Name: Kendra Hanson MRN: [id number] Date of Birth: 05-23-1961    OCCUPATIONAL THERAPY DISCHARGE SUMMARY  Visits from Start of Care: 16  Current functional level related to goals / functional outcomes: See above. Pt is using LUE during daily tasks, has made progress with functional use.    Remaining deficits: Pt continues to report pain/soreness after sustained use, pt also reports numbness/tingling intermittently.    Education / Equipment: Educated pt on importance of using LUE during daily task completion, as well as continuing with HEP.  Plan: Patient agrees to discharge.  Patient  goals were met. Patient is being discharged due to meeting the stated rehab goals.  ?????

## 2017-06-07 ENCOUNTER — Encounter (HOSPITAL_COMMUNITY): Payer: 59 | Admitting: Occupational Therapy

## 2017-06-12 ENCOUNTER — Ambulatory Visit (HOSPITAL_COMMUNITY): Payer: 59 | Admitting: Specialist

## 2017-06-12 ENCOUNTER — Encounter (HOSPITAL_COMMUNITY): Payer: 59

## 2017-06-14 ENCOUNTER — Encounter (HOSPITAL_COMMUNITY): Payer: 59 | Admitting: Specialist

## 2017-06-15 ENCOUNTER — Other Ambulatory Visit: Payer: Self-pay

## 2017-06-15 ENCOUNTER — Telehealth: Payer: Self-pay | Admitting: Family Medicine

## 2017-06-15 MED ORDER — UNABLE TO FIND: 0 refills | Status: DC

## 2017-06-15 NOTE — Telephone Encounter (Signed)
Patient called and left message on nurse line regarding compression stockings. Please call back at [phone]

## 2017-06-15 NOTE — Telephone Encounter (Signed)
OK to send  send in for hose requested  for pressure of 15 to 20 mm mercury dx is bilateral leg swelling, verify with her the height , I suspect she wants knee high, thanks

## 2017-06-15 NOTE — Telephone Encounter (Signed)
rx sent to [facility] 

## 2017-06-15 NOTE — Telephone Encounter (Signed)
Would like a rx for bilateral compression stockings sent to [facility]

## 2017-06-18 ENCOUNTER — Encounter (HOSPITAL_COMMUNITY): Payer: 59

## 2017-06-20 MED FILL — PROGESTERONE 100 MG CAPSULE: 100 | 30 days supply | Qty: 30 | Fill #6

## 2017-06-21 ENCOUNTER — Encounter (HOSPITAL_COMMUNITY): Payer: 59

## 2017-06-26 MED FILL — PRAVASTATIN NA 40 MG TAB: 40 | 90 days supply | Qty: 90 | Fill #1

## 2017-06-26 MED FILL — ACCU-CHEK GUIDE TEST STRIP: 50 days supply | Qty: 150 | Fill #3

## 2017-06-26 MED FILL — ACCU-CHEK FASTCLIX LANCETS: 30 days supply | Qty: 102 | Fill #3

## 2017-06-26 MED FILL — VICTOZA 18 MG/3 ML INJECT P: 18 | 30 days supply | Qty: 9 | Fill #6

## 2017-06-26 MED FILL — DILTIAZEM HCL ER 360 MG CAP: 360 | 90 days supply | Qty: 90 | Fill #4

## 2017-06-28 DIAGNOSIS — G5622 Lesion of ulnar nerve, left upper limb: Secondary | ICD-10-CM | POA: Diagnosis not present

## 2017-06-28 DIAGNOSIS — G5602 Carpal tunnel syndrome, left upper limb: Secondary | ICD-10-CM | POA: Diagnosis not present

## 2017-07-02 ENCOUNTER — Other Ambulatory Visit: Payer: Self-pay | Admitting: Family Medicine

## 2017-07-02 MED FILL — POTASSIUM CL 10 MEQ TAB SA: 10 | 90 days supply | Qty: 270 | Fill #0

## 2017-07-02 NOTE — Telephone Encounter (Signed)
Seen 6 11 18   Is pt to continue taking these 2 meds lke this?

## 2017-07-04 DIAGNOSIS — Z6841 Body Mass Index (BMI) 40.0 and over, adult: Secondary | ICD-10-CM | POA: Diagnosis not present

## 2017-07-04 DIAGNOSIS — G5622 Lesion of ulnar nerve, left upper limb: Secondary | ICD-10-CM | POA: Diagnosis not present

## 2017-07-04 DIAGNOSIS — G5601 Carpal tunnel syndrome, right upper limb: Secondary | ICD-10-CM | POA: Diagnosis not present

## 2017-07-04 DIAGNOSIS — G5602 Carpal tunnel syndrome, left upper limb: Secondary | ICD-10-CM | POA: Diagnosis not present

## 2017-07-04 DIAGNOSIS — G5621 Lesion of ulnar nerve, right upper limb: Secondary | ICD-10-CM | POA: Diagnosis not present

## 2017-07-09 DIAGNOSIS — R609 Edema, unspecified: Secondary | ICD-10-CM | POA: Diagnosis not present

## 2017-07-09 DIAGNOSIS — E119 Type 2 diabetes mellitus without complications: Secondary | ICD-10-CM | POA: Diagnosis not present

## 2017-07-10 ENCOUNTER — Encounter: Payer: Self-pay | Admitting: Family Medicine

## 2017-07-10 MED FILL — ESTRADIOL PATCH 0.0375: 0.0375 | 28 days supply | Qty: 8 | Fill #11

## 2017-07-23 MED FILL — PROGESTERONE 100 MG CAPSULE: 100 | 30 days supply | Qty: 30 | Fill #7

## 2017-07-23 MED FILL — VICTOZA 18 MG/3 ML INJECT P: 18 | 30 days supply | Qty: 9 | Fill #7

## 2017-07-24 ENCOUNTER — Telehealth: Payer: 59 | Admitting: Family

## 2017-07-24 DIAGNOSIS — B9689 Other specified bacterial agents as the cause of diseases classified elsewhere: Secondary | ICD-10-CM | POA: Diagnosis not present

## 2017-07-24 DIAGNOSIS — J019 Acute sinusitis, unspecified: Secondary | ICD-10-CM

## 2017-07-24 MED ORDER — DOXYCYCLINE HYCLATE 100 MG PO TABS: 100.0000 mg | ORAL_TABLET | Freq: Two times a day (BID) | ORAL | 0 refills | Status: DC

## 2017-07-24 NOTE — Progress Notes (Signed)

## 2017-07-31 DIAGNOSIS — G4701 Insomnia due to medical condition: Secondary | ICD-10-CM | POA: Diagnosis not present

## 2017-07-31 DIAGNOSIS — E1140 Type 2 diabetes mellitus with diabetic neuropathy, unspecified: Secondary | ICD-10-CM | POA: Diagnosis not present

## 2017-07-31 DIAGNOSIS — G4733 Obstructive sleep apnea (adult) (pediatric): Secondary | ICD-10-CM | POA: Diagnosis not present

## 2017-08-08 MED FILL — ESTRADIOL PATCH 0.0375: 0.0375 | 28 days supply | Qty: 8 | Fill #12

## 2017-08-13 ENCOUNTER — Telehealth: Payer: Self-pay | Admitting: Family Medicine

## 2017-08-13 DIAGNOSIS — R5383 Other fatigue: Secondary | ICD-10-CM

## 2017-08-13 DIAGNOSIS — Z794 Long term (current) use of insulin: Secondary | ICD-10-CM | POA: Diagnosis not present

## 2017-08-13 DIAGNOSIS — E1121 Type 2 diabetes mellitus with diabetic nephropathy: Secondary | ICD-10-CM | POA: Diagnosis not present

## 2017-08-14 LAB — BASIC METABOLIC PANEL WITH GFR
BUN/Creatinine Ratio: 14 (calc) (ref 6–22)
BUN: 20 mg/dL (ref 7–25)
CO2: 28 mmol/L (ref 20–32)
Calcium: 8.8 mg/dL (ref 8.6–10.4)
Chloride: 102 mmol/L (ref 98–110)
Creat: 1.38 mg/dL — ABNORMAL HIGH (ref 0.50–1.05)
GFR, Est African American: 49 mL/min/{1.73_m2} — ABNORMAL LOW (ref >=60)
GFR, Est Non African American: 42 mL/min/{1.73_m2} — ABNORMAL LOW (ref >=60)
Glucose, Bld: 119 mg/dL — ABNORMAL HIGH (ref 65–99)
Potassium: 4.6 mmol/L (ref 3.5–5.3)
Sodium: 138 mmol/L (ref 135–146)

## 2017-08-14 LAB — HEMOGLOBIN A1C
Hgb A1c MFr Bld: 6.2 % of total Hgb — ABNORMAL HIGH (ref <5.7)
Mean Plasma Glucose: 131 (calc)
eAG (mmol/L): 7.3 (calc)

## 2017-08-14 LAB — IRON: Iron: 46 ug/dL (ref 45–160)

## 2017-08-16 MED FILL — metFORMIN HCL 1000 MG TABS: 1000 | 90 days supply | Qty: 180 | Fill #1 | Status: TO

## 2017-08-16 MED FILL — LANTUS SOLOSTAR 100 UNITS/M: 100 | 75 days supply | Qty: 45 | Fill #3

## 2017-08-16 MED FILL — ACCU-CHEK GUIDE TEST STRIP: 50 days supply | Qty: 150 | Fill #4

## 2017-08-16 MED FILL — LOSARTAN POTASSIUM 100 MG T: 100 | 90 days supply | Qty: 90 | Fill #1

## 2017-08-16 MED FILL — ACCU-CHEK FASTCLIX LANCETS: 30 days supply | Qty: 102 | Fill #4

## 2017-08-20 ENCOUNTER — Other Ambulatory Visit: Payer: Self-pay | Admitting: *Deleted

## 2017-08-20 ENCOUNTER — Other Ambulatory Visit: Payer: Self-pay | Admitting: Family Medicine

## 2017-08-20 NOTE — Patient Outreach (Signed)
Spoke with [name] via phone as there are no recent blood sugar readings or weights recorded in the [facility] platform. [name] says her deices are indicating the readings are being recorded. Instructed [name] how to enter the readings manually until she can meet with this RNCM on 10/22 at the [place] office at [facility]  to see if the devices can be successfully paired  [name] also advised this RNCM that her most recent Hgb A1C was 6.2% and that Dr. Moshe Cipro stopped her Metformin due to a decrease in her GFR from 40 to 49. The updated Hgb A1C and the deletion of the Metformin were made to her [facility] care plan. Barrington Ellison RN,CCM,CDE [place] Management Coordinator [facility] [employer] [phone] Office Fax [phone]

## 2017-08-23 MED FILL — VICTOZA 18 MG/3 ML INJECT P: 18 | 30 days supply | Qty: 9 | Fill #8

## 2017-08-23 MED FILL — PROGESTERONE 100 MG CAPSULE: 100 | 30 days supply | Qty: 30 | Fill #8

## 2017-08-27 ENCOUNTER — Telehealth (HOSPITAL_COMMUNITY): Payer: Self-pay | Admitting: Occupational Therapy

## 2017-08-27 NOTE — Telephone Encounter (Signed)
Patient called with concerns about her insurance. She said they said we had the wrong DOB and they cound'nt process her claim. After talking with Leafy Ro I told the patient to have them send [place] the paperwork thta needed to be fill out. WT

## 2017-08-28 ENCOUNTER — Ambulatory Visit (INDEPENDENT_AMBULATORY_CARE_PROVIDER_SITE_OTHER): Payer: 59 | Admitting: Family Medicine

## 2017-08-28 ENCOUNTER — Encounter: Payer: Self-pay | Admitting: Family Medicine

## 2017-08-28 VITALS — BP 130/68 | HR 72 | Resp 14 | Ht 65.0 in | Wt 322.0 lb

## 2017-08-28 DIAGNOSIS — R600 Localized edema: Secondary | ICD-10-CM

## 2017-08-28 DIAGNOSIS — Z8249 Family history of ischemic heart disease and other diseases of the circulatory system: Secondary | ICD-10-CM

## 2017-08-28 DIAGNOSIS — N179 Acute kidney failure, unspecified: Secondary | ICD-10-CM | POA: Diagnosis not present

## 2017-08-28 DIAGNOSIS — Z794 Long term (current) use of insulin: Secondary | ICD-10-CM

## 2017-08-28 DIAGNOSIS — E1121 Type 2 diabetes mellitus with diabetic nephropathy: Secondary | ICD-10-CM | POA: Diagnosis not present

## 2017-08-28 DIAGNOSIS — E611 Iron deficiency: Secondary | ICD-10-CM | POA: Diagnosis not present

## 2017-08-28 DIAGNOSIS — I110 Hypertensive heart disease with heart failure: Secondary | ICD-10-CM

## 2017-08-28 DIAGNOSIS — I10 Essential (primary) hypertension: Secondary | ICD-10-CM

## 2017-08-28 NOTE — Patient Instructions (Addendum)
F/u in 3 months, call if you need me before  Non fasting chem 7 and EGFR, TSH  November 1   You are being referred to cardiology and also to Dr Dorris Fetch  No changes in medication at thsi time, PLEASE stop fruit juisce and sodas  NO metformin

## 2017-08-28 NOTE — Progress Notes (Signed)
   Kendra Hanson     MRN: [id number]      DOB: 04-19-1961   HPI Kendra Hanson is here for follow up and re-evaluation of chronic medical conditions, medication management and review of any available recent lab and radiology data.  Preventive health is updated, specifically  Cancer screening and Immunization.   C/o swelling of legs a and generalized swelling with weight gain due to excess fluid. C/o fatigue. C/o need to use 2 pillows and inability to lie flat for over 2 months. Denies overt chest pain but reports very poor exercise tolerance. He does have a sibling with established CAD in his early 19's as well as her increased personal risk of CAD with co morbidities which she does have, needs cardiology re assessment Of concern, her recent labs show deterioration in her renal function, unclear etiology, she has been advised to discontinue metformin and will have rept labs in the next 2 weeks as well as nephrology evaluation Denies polyuria, polydipsia, blurred vision , or hypoglycemic episodes. Reports consuming increased amountsof fruit juice and diet sodas in recent times , I have asked her to discontinue both   ROS Denies recent fever or chills.c/o fatigue Denies sinus pressure, nasal congestion, ear pain or sore throat. Denies chest congestion, productive cough or wheezing.  Denies abdominal pain, nausea, vomiting,diarrhea or constipation.   Denies dysuria, frequency, hesitancy or incontinence. Denies joint pain, swelling and limitation in mobility. Denies headaches, seizures, numbness, or tingling. Denies depression, anxiety or insomnia. Denies skin break down or rash.   PE BP 130/68 (BP Location: Right Arm, Cuff Size: Large)   Pulse 72 Comment: regular  Resp 14   Ht 5' 5" (1.651 m)   Wt (!) 322 lb (146.1 kg)   BMI 53.58 kg/m     Patient alert and oriented and in no cardiopulmonary distress.  HEENT: No facial asymmetry, EOMI,   oropharynx pink and moist.  Neck supple no  JVD, no mass.  Chest: Clear to auscultation bilaterally.  CVS: S1, S2 no murmurs, no S3.Regular rate.  ABD: Soft non tender.   Ext:2 plus pitting edema  MS: Adequate ROM spine, shoulders, hips and knees.  Skin: Intact, no ulcerations or rash noted.  Psych: Good eye contact, normal affect. Memory intact not anxious or depressed appearing.  CNS: CN 2-12 intact, power,  normal throughout.no focal deficits noted.   Assessment & Plan  Type II diabetes mellitus with renal manifestations Acute deterioration in renal function noted in most recent lab data. Excellent blood sugar control. Pt has been advised to d/c metformin, wll repeat lab for renal function in 2 weeks, needs to d/c sodas and is referred to nephrology. NSAID use is prohibited, noe isreported  Hypertensive heart disease with heart failure (Kendra Hanson) Symptoms andsigns suggestive of heart failure, will order CXR, BNP and repeat chem 7 and EGFR in am , [place] needs cardiology evaluation asap , both for heart failure as well as CAD due to increased risk  Iron deficiency Iron level low normal, not anemic , no longer bleeding , continue once daily iron

## 2017-08-29 ENCOUNTER — Ambulatory Visit (HOSPITAL_COMMUNITY)
Admission: RE | Admit: 2017-08-29 | Discharge: 2017-08-29 | Disposition: A | Discharge status: Home / Self Care | Payer: 59 | Source: Ambulatory Visit | Attending: Family Medicine | Admitting: Family Medicine

## 2017-08-29 ENCOUNTER — Telehealth: Payer: Self-pay | Admitting: Family Medicine

## 2017-08-29 ENCOUNTER — Encounter: Payer: Self-pay | Admitting: Cardiovascular Disease

## 2017-08-29 ENCOUNTER — Telehealth: Payer: Self-pay | Admitting: Cardiovascular Disease

## 2017-08-29 ENCOUNTER — Ambulatory Visit (INDEPENDENT_AMBULATORY_CARE_PROVIDER_SITE_OTHER): Payer: 59 | Admitting: Cardiovascular Disease

## 2017-08-29 VITALS — BP 140/82 | HR 79 | Ht 65.0 in | Wt 323.0 lb

## 2017-08-29 DIAGNOSIS — N189 Chronic kidney disease, unspecified: Secondary | ICD-10-CM | POA: Diagnosis not present

## 2017-08-29 DIAGNOSIS — I878 Other specified disorders of veins: Secondary | ICD-10-CM | POA: Insufficient documentation

## 2017-08-29 DIAGNOSIS — R0602 Shortness of breath: Secondary | ICD-10-CM

## 2017-08-29 DIAGNOSIS — I110 Hypertensive heart disease with heart failure: Secondary | ICD-10-CM | POA: Insufficient documentation

## 2017-08-29 DIAGNOSIS — R600 Localized edema: Secondary | ICD-10-CM | POA: Diagnosis not present

## 2017-08-29 DIAGNOSIS — I10 Essential (primary) hypertension: Secondary | ICD-10-CM

## 2017-08-29 DIAGNOSIS — I509 Heart failure, unspecified: Secondary | ICD-10-CM

## 2017-08-29 DIAGNOSIS — R601 Generalized edema: Secondary | ICD-10-CM

## 2017-08-29 DIAGNOSIS — E782 Mixed hyperlipidemia: Secondary | ICD-10-CM

## 2017-08-29 DIAGNOSIS — R002 Palpitations: Secondary | ICD-10-CM | POA: Diagnosis not present

## 2017-08-29 DIAGNOSIS — R0601 Orthopnea: Secondary | ICD-10-CM | POA: Diagnosis not present

## 2017-08-29 DIAGNOSIS — J810 Acute pulmonary edema: Secondary | ICD-10-CM | POA: Diagnosis not present

## 2017-08-29 MED ORDER — TORSEMIDE 20 MG PO TABS: 20.0000 mg | ORAL_TABLET | Freq: Two times a day (BID) | ORAL | 1 refills | Status: DC

## 2017-08-29 MED ORDER — TORSEMIDE 20 MG PO TABS: 20.0000 mg | ORAL_TABLET | Freq: Two times a day (BID) | ORAL | 3 refills | Status: DC

## 2017-08-29 MED FILL — TORSEMIDE 20 MG TABLET: 20 | 90 days supply | Qty: 180 | Fill #0 | Status: TO

## 2017-08-29 NOTE — Assessment & Plan Note (Signed)
Acute deterioration in renal function noted in most recent lab data. Excellent blood sugar control. Pt has been advised to d/c metformin, wll repeat lab for renal function in 2 weeks, needs to d/c sodas and is referred to nephrology. NSAID use is prohibited, noe isreported

## 2017-08-29 NOTE — Progress Notes (Signed)
CARDIOLOGY CONSULT NOTE  [name] MRN: [id number] DOB/AGE: 06/25/1961 56 y.o.  Admit date: (Not on file) Primary Physician: Fayrene Helper, MD Referring Physician: Dr. Moshe Cipro  Reason for Consultation: CHF  HPI: Kendra Hanson is a 56 y.o. female who is being seen today for the evaluation of CHF at the request of Fayrene Helper, MD.   I was contacted by her PCP as the patient has been complaining of progressive lower extremity swelling and orthopnea for the past 2 months. She has poor exercise tolerance.   She has a family history of premature coronary artery disease.  Past medical history includes obesity, type 2 diabetes mellitus, obstructive sleep apnea,hypertension, and hyperlipidemia.  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  Nuclear stress test in 2015 was low risk with no evidence of ischemia or infarction. Echocardiogram at that time demonstrated normal left ventricular systolic function, LVEF 16-60%, mild LVH, and grade 1 diastolic dysfunction with elevated filling pressures, and mild mitral and tricuspid regurgitation.  Several blood tests were ordered by her PCP yesterday including a BNP, CBC, TSH, and complete metabolic panel. They're all pending at this time.  A chest x-ray today demonstrated mild cardiac enlargement and pulmonary vascular congestion.  ECG performed on 01/07/17 which I personally interpreted demonstrated normal sinus rhythm with no ischemic ST segment or T-wave abnormalities, nor any arrhythmias.  Hemoglobin A1c 6.2% on 08/13/17. BUN 20, creatinine 1.38.  Lipid panel 04/12/17: Total cholesterol 181, triglycerides 73, HDL 63, LDL 103.  For the past 4-6 weeks, she has noticed a progressive weight gain and swelling all over her body. She has been on stable dose of Lasix 20 mg 3 times a day for the past few years. She has been progressively more short of breath over the past 2 weeks. She denies chest  pain. She is now sleeping with 2 pillows over the past several weeks. She has had a nonproductive cough. She denies paroxysmal nocturnal dyspnea.  Blood pressure was normal yesterday, today is 140/82 in our office.  She underwent left carpal tunnel surgery and left elbow decompressive surgery on 02/19/17.   Family history: Father had CHF. Brother had CHF and died in his 27s. Sister had CHF and has since passed away.    Allergies  Allergen Reactions  . Diclofenac Other (See Comments)    hallucination  . Oxycodone     Vomiting     Current Outpatient Prescriptions  Medication Sig Dispense Refill  . ACCU-CHEK FASTCLIX LANCETS MISC Three times daily dx e11.65 150 each 5  . azelastine (ASTELIN) 0.1 % nasal spray Place 2 sprays into both nostrils 2 (two) times daily. Use in each nostril as directed 90 mL 1  . Blood Glucose Monitoring Suppl (FREESTYLE LITE) DEVI   0  . cyclobenzaprine (FLEXERIL) 10 MG tablet Take 1 tablet (10 mg total) by mouth 3 (three) times daily as needed for muscle spasms. 30 tablet 0  . diltiazem (TIAZAC) 360 MG 24 hr capsule TAKE 1 CAPSULE BY MOUTH DAILY. 90 capsule PRN  . Docusate Calcium (STOOL SOFTENER PO) Take by mouth daily.    Marland Kitchen estradiol (VIVELLE-DOT) 0.0375 MG/24HR Place 1 patch onto the skin 3 (three) times a week. Monday, Wednesday, and Saturday.  12  . ferrous sulfate 325 (65 FE) MG tablet Take 325 mg by mouth daily with breakfast.    . fluticasone (FLONASE) 50 MCG/ACT nasal spray Place 2 sprays into both nostrils  daily. 48 g 1  . furosemide (LASIX) 20 MG tablet TAKE 1 TABLET BY MOUTH 3 TIMES DAILY. 270 tablet 1  . glucose blood (ACCU-CHEK GUIDE) test strip Three times daily testing dx e11.65 150 each 5  . glucose blood (FREESTYLE TEST STRIPS) test strip Three times daily testing dx e11.65 100 each 12  . insulin glargine (LANTUS) 100 unit/mL SOPN Inject 0.6 mLs (60 Units total) into the skin at bedtime. 15 pen 3  . Insulin Lispro Prot & Lispro (HUMALOG MIX  75/25 KWIKPEN) (75-25) 100 UNIT/ML Kwikpen Inject 12 units in the morning, 10 units at lunchtime, and 12 units at bedtime 45 mL 3  . KLOR-CON M10 10 MEQ tablet TAKE 1 TABLET BY MOUTH 3 TIMES DAILY. 270 tablet 1  . Lancets (FREESTYLE) lancets Use as instructed three times daily dx e11.65 100 each 12  . liraglutide 18 MG/3ML SOPN Inject 0.3 mLs (1.8 mg total) into the skin daily. 90 mL 3  . loratadine (CLARITIN) 10 MG tablet Take 10 mg by mouth daily as needed for allergies.     Marland Kitchen losartan (COZAAR) 100 MG tablet TAKE 1 TABLET BY MOUTH ONCE DAILY 90 tablet 1  . NOVOLOG FLEXPEN 100 UNIT/ML FlexPen   98  . ondansetron (ZOFRAN) 4 MG tablet Take 1 tablet (4 mg total) by mouth 2 (two) times daily as needed for nausea or vomiting. 30 tablet 0  . pantoprazole (PROTONIX) 20 MG tablet TAKE 1 TABLET BY MOUTH EVERY DAY 90 tablet 1  . pravastatin (PRAVACHOL) 40 MG tablet TAKE 1 TABLET BY MOUTH DAILY. 90 tablet 1  . PREMARIN vaginal cream   10  . terconazole (TERAZOL 7) 0.4 % vaginal cream   0  . UNABLE TO FIND Diabetes shoes x 1 and inserts x 3 Dx:E11.29 1 each 0  . UNABLE TO FIND Compression hose (knee high) 15-32mm 1 pair Dx bilateral leg swelling 1 each 0  . UNIFINE PENTIPS 31G X 8 MM MISC USE AS DIRECTED 100 each 3  . vitamin B-12 (CYANOCOBALAMIN) 100 MCG tablet Take 100 mcg by mouth daily.     No current facility-administered medications for this visit.     Past Medical History:  Diagnosis Date  . Anemia   . Fibroids    Uterine  . GERD (gastroesophageal reflux disease)   . Hyperlipidemia 2008   Lipid profile in 04/2011:136, 53, 43  . Hypertension 2008   Normal CBC and CMet in 2012; negative stress nuclear in 2006- patient asymptomatic  . Insulin dependent diabetes mellitus ([place]) 1996  . Multiple allergies    perennial  . Normocytic normochromic anemia 12/27/2015  . Obesity   . PONV (postoperative nausea and vomiting)   . Sleep apnea    CPAP    Past Surgical History:  Procedure  Laterality Date  . COLONOSCOPY N/A 04/05/2015   Procedure: COLONOSCOPY;  Surgeon: Rogene Houston, MD;  Location: [facility];  Service: Endoscopy;  Laterality: N/A;  730  . DENTAL SURGERY    . ESOPHAGOGASTRODUODENOSCOPY N/A 01/21/2016   Procedure: ESOPHAGOGASTRODUODENOSCOPY (EGD);  Surgeon: Rogene Houston, MD;  Location: [facility];  Service: Endoscopy;  Laterality: N/A;  11:15  . REFRACTIVE SURGERY  2011   Bilateral, two seperate occasions first in 2006  . REMOVAL OF IMPLANT    . RETINAL DETACHMENT SURGERY Bilateral 05/2005    Social History   Social History  . Marital status: Married    Spouse name: N/A  . Number of children: N/A  .  Years of education: N/A   Occupational History  . Employed [employer] History Main Topics  . Smoking status: Never Smoker  . Smokeless tobacco: Never Used  . Alcohol use No  . Drug use: No  . Sexual activity: Yes    Birth control/ protection: None, Implant   Other Topics Concern  . Not on file   Social History Narrative   Married with 2 children      Current Meds  Medication Sig  . ACCU-CHEK FASTCLIX LANCETS MISC Three times daily dx e11.65  . azelastine (ASTELIN) 0.1 % nasal spray Place 2 sprays into both nostrils 2 (two) times daily. Use in each nostril as directed  . Blood Glucose Monitoring Suppl (FREESTYLE LITE) DEVI   . cyclobenzaprine (FLEXERIL) 10 MG tablet Take 1 tablet (10 mg total) by mouth 3 (three) times daily as needed for muscle spasms.  Marland Kitchen diltiazem (TIAZAC) 360 MG 24 hr capsule TAKE 1 CAPSULE BY MOUTH DAILY.  Marland Kitchen Docusate Calcium (STOOL SOFTENER PO) Take by mouth daily.  Marland Kitchen estradiol (VIVELLE-DOT) 0.0375 MG/24HR Place 1 patch onto the skin 3 (three) times a week. Monday, Wednesday, and Saturday.  . ferrous sulfate 325 (65 FE) MG tablet Take 325 mg by mouth daily with breakfast.  . fluticasone (FLONASE) 50 MCG/ACT nasal spray Place 2 sprays into both nostrils daily.  . furosemide (LASIX) 20 MG tablet TAKE 1  TABLET BY MOUTH 3 TIMES DAILY.  Marland Kitchen glucose blood (ACCU-CHEK GUIDE) test strip Three times daily testing dx e11.65  . glucose blood (FREESTYLE TEST STRIPS) test strip Three times daily testing dx e11.65  . insulin glargine (LANTUS) 100 unit/mL SOPN Inject 0.6 mLs (60 Units total) into the skin at bedtime.  . Insulin Lispro Prot & Lispro (HUMALOG MIX 75/25 KWIKPEN) (75-25) 100 UNIT/ML Kwikpen Inject 12 units in the morning, 10 units at lunchtime, and 12 units at bedtime  . KLOR-CON M10 10 MEQ tablet TAKE 1 TABLET BY MOUTH 3 TIMES DAILY.  Marland Kitchen Lancets (FREESTYLE) lancets Use as instructed three times daily dx e11.65  . liraglutide 18 MG/3ML SOPN Inject 0.3 mLs (1.8 mg total) into the skin daily.  Marland Kitchen loratadine (CLARITIN) 10 MG tablet Take 10 mg by mouth daily as needed for allergies.   Marland Kitchen losartan (COZAAR) 100 MG tablet TAKE 1 TABLET BY MOUTH ONCE DAILY  . NOVOLOG FLEXPEN 100 UNIT/ML FlexPen   . ondansetron (ZOFRAN) 4 MG tablet Take 1 tablet (4 mg total) by mouth 2 (two) times daily as needed for nausea or vomiting.  . pantoprazole (PROTONIX) 20 MG tablet TAKE 1 TABLET BY MOUTH EVERY DAY  . pravastatin (PRAVACHOL) 40 MG tablet TAKE 1 TABLET BY MOUTH DAILY.  Marland Kitchen PREMARIN vaginal cream   . terconazole (TERAZOL 7) 0.4 % vaginal cream   . UNABLE TO FIND Diabetes shoes x 1 and inserts x 3 Dx:E11.29  . UNABLE TO FIND Compression hose (knee high) 15-8mm 1 pair Dx bilateral leg swelling  . UNIFINE PENTIPS 31G X 8 MM MISC USE AS DIRECTED  . vitamin B-12 (CYANOCOBALAMIN) 100 MCG tablet Take 100 mcg by mouth daily.      Review of systems complete and found to be negative unless listed above in HPI    Physical exam Blood pressure 140/82, pulse 79, height 5\' 5"  (1.651 m), weight (!) 323 lb (146.5 kg), SpO2 98 %. General: NAD Neck: No JVD, no thyromegaly or thyroid nodule.  Lungs: Clear to auscultation bilaterally with normal respiratory effort. CV: Nondisplaced  PMI. Regular rate and rhythm, normal S1/S2,  no S3/S4, no murmur.  Tense 2-3+ pitting bilateral lower extremity edema.  No carotid bruit.    Abdomen: Soft, nontender, no distention.  Skin: Intact without lesions or rashes.  Neurologic: Alert and oriented x 3.  Psych: Normal affect. Extremities: No clubbing or cyanosis.  HEENT: Normal.   ECG: Most recent ECG reviewed.   Labs: Lab Results  Component Value Date/Time   K 4.6 08/13/2017 01:54 PM   BUN 20 08/13/2017 01:54 PM   CREATININE 1.38 (H) 08/13/2017 01:54 PM   ALT 8 04/12/2017 11:21 AM   TSH 2.35 07/01/2016 10:05 AM   HGB 12.2 11/30/2016 03:42 PM     Lipids: Lab Results  Component Value Date/Time   LDLCALC 103 (H) 04/12/2017 11:21 AM   CHOL 181 04/12/2017 11:21 AM   TRIG 73 04/12/2017 11:21 AM   HDL 63 04/12/2017 11:21 AM        ASSESSMENT AND PLAN:  1. Progressive exertional dyspnea/bilateral leg edema/orthopnea/anasarca consistent with acute heart failure, potentially diastolic: Symptoms have progressively gotten worse over the past 4-6 weeks. Chest x-ray reviewed above shows pulmonary vascular congestion. Currently on Lasix 20 mg 3 times a day. I will switch Lasix to torsemide 20 mg twice daily. I will check a basic metabolic panel on 20/35/59. I will review the results from the one performed today. I will order a 2-D echocardiogram with Doppler to evaluate cardiac structure, function, and regional wall motion. I will likely obtain a nuclear stress test in the near future.  2. Hypertension: Mildly elevated. I will monitor given switch to alternate diuretic regimen.  3. Palpitations: Controlled on long-acting diltiazem. No changes for now unless cardiac function is depressed.  4. Hypercholesterolemia: Continue statin with pravastatin 40 mg. Lipids reviewed above.   Disposition: Follow up on 09/18/17.   Signed: Kate Sable, M.D., F.A.C.C.  08/29/2017, 4:52 PM

## 2017-08-29 NOTE — Assessment & Plan Note (Signed)
Iron level low normal, not anemic , no longer bleeding , continue once daily iron

## 2017-08-29 NOTE — Telephone Encounter (Signed)
echo w/ contrast - anasarca  Scheduled [place] Aug 31, 2017 @ 3:00Pm

## 2017-08-29 NOTE — Assessment & Plan Note (Signed)
Symptoms andsigns suggestive of heart failure, will order CXR, BNP and repeat chem 7 and EGFR in am , [place] needs cardiology evaluation asap , both for heart failure as well as CAD due to increased risk

## 2017-08-29 NOTE — Patient Instructions (Signed)
Medication Instructions:   Stop Lasix.  Begin Torsemide 20mg  twice a day.  Continue all other medications.    Labwork:  BMET - order given today - Do on Monday, 09/03/2017.    Office will contact with results via phone or letter.    Testing/Procedures:  Your physician has requested that you have an echocardiogram. (with contrast) Echocardiography is a painless test that uses sound waves to create images of your heart. It provides your doctor with information about the size and shape of your heart and how well your heart's chambers and valves are working. This procedure takes approximately one hour. There are no restrictions for this procedure.  Office will contact with results via phone or letter.    Follow-Up: 09/18/2017  Any Other Special Instructions Will Be Listed Below (If Applicable).  If you need a refill on your cardiac medications before your next appointment, please call your pharmacy.

## 2017-08-29 NOTE — Telephone Encounter (Signed)
-----  Message from Fayrene Helper, MD sent at 08/29/2017 12:51 PM EDT ----- Regarding: pls order  for this pt chem 7 and eGFR, and BNP and tSH and cBC Dx are fatigue, chronic kidney disease and  Hypertension, I have told her she can go to the lab at 1: 77

## 2017-08-30 ENCOUNTER — Other Ambulatory Visit: Payer: Self-pay | Admitting: Family Medicine

## 2017-08-30 DIAGNOSIS — N183 Chronic kidney disease, stage 3 unspecified: Secondary | ICD-10-CM

## 2017-08-30 LAB — CBC
HCT: 32.2 % — ABNORMAL LOW (ref 35.0–45.0)
Hemoglobin: 10.9 g/dL — ABNORMAL LOW (ref 11.7–15.5)
MCH: 29.6 pg (ref 27.0–33.0)
MCHC: 33.9 g/dL (ref 32.0–36.0)
MCV: 87.5 fL (ref 80.0–100.0)
MPV: 10.3 fL (ref 7.5–12.5)
Platelets: 314 10*3/uL (ref 140–400)
RBC: 3.68 10*6/uL — ABNORMAL LOW (ref 3.80–5.10)
RDW: 12.9 % (ref 11.0–15.0)
WBC: 9.5 10*3/uL (ref 3.8–10.8)

## 2017-08-30 LAB — COMPLETE METABOLIC PANEL WITH GFR
AG Ratio: 1.3 (calc) (ref 1.0–2.5)
ALT: 9 U/L (ref 6–29)
AST: 10 U/L (ref 10–35)
Albumin: 3.9 g/dL (ref 3.6–5.1)
Alkaline phosphatase (APISO): 119 U/L (ref 33–130)
BUN/Creatinine Ratio: 12 (calc) (ref 6–22)
BUN: 19 mg/dL (ref 7–25)
CO2: 29 mmol/L (ref 20–32)
Calcium: 8.9 mg/dL (ref 8.6–10.4)
Chloride: 101 mmol/L (ref 98–110)
Creat: 1.54 mg/dL — ABNORMAL HIGH (ref 0.50–1.05)
GFR, Est African American: 43 mL/min/{1.73_m2} — ABNORMAL LOW (ref >=60)
GFR, Est Non African American: 37 mL/min/{1.73_m2} — ABNORMAL LOW (ref >=60)
Globulin: 3 g/dL (calc) (ref 1.9–3.7)
Glucose, Bld: 115 mg/dL (ref 65–139)
Potassium: 4.3 mmol/L (ref 3.5–5.3)
Sodium: 137 mmol/L (ref 135–146)
Total Bilirubin: 0.4 mg/dL (ref 0.2–1.2)
Total Protein: 6.9 g/dL (ref 6.1–8.1)

## 2017-08-30 LAB — BRAIN NATRIURETIC PEPTIDE: Brain Natriuretic Peptide: 26 pg/mL (ref <100)

## 2017-08-30 LAB — TSH: TSH: 2.1 mIU/L (ref 0.40–4.50)

## 2017-08-31 ENCOUNTER — Other Ambulatory Visit: Payer: Self-pay | Admitting: *Deleted

## 2017-08-31 ENCOUNTER — Ambulatory Visit (HOSPITAL_COMMUNITY)
Admission: RE | Admit: 2017-08-31 | Discharge: 2017-08-31 | Disposition: A | Discharge status: Home / Self Care | Payer: 59 | Source: Ambulatory Visit | Attending: Cardiovascular Disease | Admitting: Cardiovascular Disease

## 2017-08-31 DIAGNOSIS — I119 Hypertensive heart disease without heart failure: Secondary | ICD-10-CM | POA: Diagnosis not present

## 2017-08-31 DIAGNOSIS — I110 Hypertensive heart disease with heart failure: Secondary | ICD-10-CM

## 2017-08-31 DIAGNOSIS — E785 Hyperlipidemia, unspecified: Secondary | ICD-10-CM | POA: Diagnosis not present

## 2017-08-31 DIAGNOSIS — E119 Type 2 diabetes mellitus without complications: Secondary | ICD-10-CM | POA: Insufficient documentation

## 2017-08-31 DIAGNOSIS — R601 Generalized edema: Secondary | ICD-10-CM | POA: Insufficient documentation

## 2017-08-31 MED ORDER — PERFLUTREN LIPID MICROSPHERE
1.0000 mL | INTRAVENOUS | Status: AC | PRN
  Administered 2017-08-31: 2 mL via INTRAVENOUS
  Filled 2017-08-31: qty 10

## 2017-08-31 NOTE — OR Nursing (Signed)
Called to Echo lab to start IV on patient. 51 gauge started in left [facility] on 1 attempt. Patient tolerated without difficulty.

## 2017-08-31 NOTE — Progress Notes (Signed)
*  PRELIMINARY RESULTS* Echocardiogram 2D Echocardiogram with definity has been performed. Left AC 22 gauge iv removed by me.  Leavy Cella 08/31/2017, 4:17 PM

## 2017-09-03 ENCOUNTER — Other Ambulatory Visit: Payer: Self-pay | Admitting: *Deleted

## 2017-09-03 DIAGNOSIS — R601 Generalized edema: Secondary | ICD-10-CM | POA: Diagnosis not present

## 2017-09-03 DIAGNOSIS — I10 Essential (primary) hypertension: Secondary | ICD-10-CM | POA: Diagnosis not present

## 2017-09-03 NOTE — Patient Outreach (Signed)
Kendra Hanson came by the [place] office at [facility] for assistance with obtaining new [employer] devices and pairing them to her phone. Issued her another [employer]k glucometer and activity tracker as the batteries in her glucometer would not hold charge and her activity tracker would not stay charged. She brought her Wellsmith scale with her. Successfully paried all three devices. She says she has been having trouble with fluid weight gain and shortness of breath so her primary care provider referred her to a cardiolgosit and she saw him on 10/17. She had a 2 d echo and she was started on torsemide with a significant amount (20 lbs) of fluid loss. She will be seeing a nephrologist at the end of the month for a decrease in her GFR. Will continue to monitor her DM self management skills in the [employer]. Barrington Ellison RN,CCM,CDE [place] Management Coordinator [facility] [employer] [phone] Office Fax [phone]

## 2017-09-04 ENCOUNTER — Telehealth: Payer: Self-pay

## 2017-09-04 DIAGNOSIS — Z79899 Other long term (current) drug therapy: Secondary | ICD-10-CM

## 2017-09-04 LAB — BASIC METABOLIC PANEL
BUN/Creatinine Ratio: 14 (calc) (ref 6–22)
BUN: 28 mg/dL — ABNORMAL HIGH (ref 7–25)
CO2: 31 mmol/L (ref 20–32)
Calcium: 8.9 mg/dL (ref 8.6–10.4)
Chloride: 101 mmol/L (ref 98–110)
Creat: 2.01 mg/dL — ABNORMAL HIGH (ref 0.50–1.05)
Glucose, Bld: 240 mg/dL — ABNORMAL HIGH (ref 65–139)
Potassium: 4.8 mmol/L (ref 3.5–5.3)
Sodium: 138 mmol/L (ref 135–146)

## 2017-09-04 MED ORDER — TORSEMIDE 20 MG PO TABS
20.0000 mg | ORAL_TABLET | Freq: Every day | ORAL | 3 refills | Status: DC
Start: 2017-09-04 — End: 2017-09-18

## 2017-09-04 NOTE — Telephone Encounter (Signed)
Pt notified, lab slip at front desk, torsemide reduced to 20 mg daily

## 2017-09-04 NOTE — Telephone Encounter (Signed)
-----   Message from Herminio Commons, MD sent at 09/04/2017  8:48 AM EDT ----- Please call and ask patient how she is feeling now after change to diuretic regimen with respect to shortness of breath and leg swelling.  Reduce torsemide to 20 mg daily and repeat BMET in one week.

## 2017-09-06 MED FILL — ESTRADIOL PATCH 0.0375: 0.0375 | 28 days supply | Qty: 8 | Fill #0

## 2017-09-11 ENCOUNTER — Ambulatory Visit (INDEPENDENT_AMBULATORY_CARE_PROVIDER_SITE_OTHER): Payer: 59

## 2017-09-11 ENCOUNTER — Telehealth: Payer: Self-pay | Admitting: Family Medicine

## 2017-09-11 ENCOUNTER — Telehealth: Payer: Self-pay | Admitting: *Deleted

## 2017-09-11 DIAGNOSIS — R319 Hematuria, unspecified: Secondary | ICD-10-CM | POA: Diagnosis not present

## 2017-09-11 DIAGNOSIS — I10 Essential (primary) hypertension: Secondary | ICD-10-CM | POA: Diagnosis not present

## 2017-09-11 DIAGNOSIS — N189 Chronic kidney disease, unspecified: Secondary | ICD-10-CM | POA: Diagnosis not present

## 2017-09-11 DIAGNOSIS — E1129 Type 2 diabetes mellitus with other diabetic kidney complication: Secondary | ICD-10-CM | POA: Diagnosis not present

## 2017-09-11 DIAGNOSIS — R809 Proteinuria, unspecified: Secondary | ICD-10-CM | POA: Diagnosis not present

## 2017-09-11 DIAGNOSIS — G4733 Obstructive sleep apnea (adult) (pediatric): Secondary | ICD-10-CM | POA: Diagnosis not present

## 2017-09-11 LAB — POCT URINALYSIS DIPSTICK
Bilirubin, UA: NEGATIVE
Glucose, UA: NEGATIVE
Ketones, UA: NEGATIVE
Leukocytes, UA: NEGATIVE
Nitrite, UA: NEGATIVE
Protein, UA: NEGATIVE
Spec Grav, UA: 1.01 (ref 1.010–1.025)
Urobilinogen, UA: 0.2 E.U./dL
pH, UA: 5 (ref 5.0–8.0)

## 2017-09-11 NOTE — Telephone Encounter (Signed)
CCUA in office positive for blood , no evidence of infection, pt aware. Specimen sent for c/s , no medication prescribed, she is aware No kidney stone history

## 2017-09-11 NOTE — Progress Notes (Signed)
Patient came to office c/o hematuria. UA ran and sent for cx.

## 2017-09-11 NOTE — Telephone Encounter (Signed)
Patient called stating the kidney doctor done a 24 hour urine on her and there was some blood in the urine and the doctor told patient to call and let Dr Moshe Cipro know and see if Dr Moshe Cipro will call in something. Please advise

## 2017-09-12 ENCOUNTER — Other Ambulatory Visit (HOSPITAL_COMMUNITY)
Admission: RE | Admit: 2017-09-12 | Discharge: 2017-09-12 | Disposition: A | Discharge status: Home / Self Care | Payer: 59 | Source: Other Acute Inpatient Hospital | Attending: Family Medicine | Admitting: Family Medicine

## 2017-09-12 DIAGNOSIS — R319 Hematuria, unspecified: Secondary | ICD-10-CM | POA: Insufficient documentation

## 2017-09-12 LAB — COMPLETE METABOLIC PANEL WITH GFR
AG Ratio: 1.3 (calc) (ref 1.0–2.5)
ALT: 7 U/L (ref 6–29)
AST: 11 U/L (ref 10–35)
Albumin: 3.8 g/dL (ref 3.6–5.1)
Alkaline phosphatase (APISO): 107 U/L (ref 33–130)
BUN/Creatinine Ratio: 15 (calc) (ref 6–22)
BUN: 24 mg/dL (ref 7–25)
CO2: 28 mmol/L (ref 20–32)
Calcium: 9.1 mg/dL (ref 8.6–10.4)
Chloride: 102 mmol/L (ref 98–110)
Creat: 1.64 mg/dL — ABNORMAL HIGH (ref 0.50–1.05)
GFR, Est African American: 40 mL/min/{1.73_m2} — ABNORMAL LOW (ref >=60)
GFR, Est Non African American: 34 mL/min/{1.73_m2} — ABNORMAL LOW (ref >=60)
Globulin: 3 g/dL (calc) (ref 1.9–3.7)
Glucose, Bld: 229 mg/dL — ABNORMAL HIGH (ref 65–139)
Potassium: 4.6 mmol/L (ref 3.5–5.3)
Sodium: 138 mmol/L (ref 135–146)
Total Bilirubin: 0.4 mg/dL (ref 0.2–1.2)
Total Protein: 6.8 g/dL (ref 6.1–8.1)

## 2017-09-12 LAB — URINALYSIS, ROUTINE W REFLEX MICROSCOPIC
Bilirubin Urine: NEGATIVE
Glucose, UA: NEGATIVE mg/dL
Ketones, ur: NEGATIVE mg/dL
Leukocytes, UA: NEGATIVE
Nitrite: NEGATIVE
Protein, ur: NEGATIVE mg/dL
Specific Gravity, Urine: 1.011 (ref 1.005–1.030)
pH: 5 (ref 5.0–8.0)

## 2017-09-12 LAB — TSH: TSH: 1.61 mIU/L (ref 0.40–4.50)

## 2017-09-12 LAB — IRON: Iron: 39 ug/dL — ABNORMAL LOW (ref 45–160)

## 2017-09-13 ENCOUNTER — Other Ambulatory Visit (HOSPITAL_COMMUNITY): Payer: Self-pay | Admitting: Nephrology

## 2017-09-13 ENCOUNTER — Encounter: Payer: Self-pay | Admitting: Family Medicine

## 2017-09-13 DIAGNOSIS — N189 Chronic kidney disease, unspecified: Secondary | ICD-10-CM | POA: Diagnosis not present

## 2017-09-13 DIAGNOSIS — N183 Chronic kidney disease, stage 3 unspecified: Secondary | ICD-10-CM

## 2017-09-13 DIAGNOSIS — R809 Proteinuria, unspecified: Secondary | ICD-10-CM | POA: Diagnosis not present

## 2017-09-13 DIAGNOSIS — G5621 Lesion of ulnar nerve, right upper limb: Secondary | ICD-10-CM | POA: Diagnosis not present

## 2017-09-13 DIAGNOSIS — G5601 Carpal tunnel syndrome, right upper limb: Secondary | ICD-10-CM | POA: Diagnosis not present

## 2017-09-13 DIAGNOSIS — Z6841 Body Mass Index (BMI) 40.0 and over, adult: Secondary | ICD-10-CM | POA: Diagnosis not present

## 2017-09-13 DIAGNOSIS — R601 Generalized edema: Secondary | ICD-10-CM | POA: Diagnosis not present

## 2017-09-13 DIAGNOSIS — E1129 Type 2 diabetes mellitus with other diabetic kidney complication: Secondary | ICD-10-CM | POA: Diagnosis not present

## 2017-09-13 DIAGNOSIS — R030 Elevated blood-pressure reading, without diagnosis of hypertension: Secondary | ICD-10-CM | POA: Diagnosis not present

## 2017-09-14 LAB — URINE CULTURE

## 2017-09-14 MED FILL — LYRICA 50 MG CAPSULE: 50 | 30 days supply | Qty: 60 | Fill #0 | Status: TO

## 2017-09-18 ENCOUNTER — Ambulatory Visit (INDEPENDENT_AMBULATORY_CARE_PROVIDER_SITE_OTHER): Payer: 59 | Admitting: Cardiovascular Disease

## 2017-09-18 ENCOUNTER — Encounter: Payer: Self-pay | Admitting: Cardiovascular Disease

## 2017-09-18 ENCOUNTER — Encounter: Payer: Self-pay | Admitting: *Deleted

## 2017-09-18 VITALS — BP 122/64 | HR 82 | Ht 65.0 in | Wt 319.0 lb

## 2017-09-18 DIAGNOSIS — I5032 Chronic diastolic (congestive) heart failure: Secondary | ICD-10-CM

## 2017-09-18 DIAGNOSIS — E782 Mixed hyperlipidemia: Secondary | ICD-10-CM

## 2017-09-18 DIAGNOSIS — R600 Localized edema: Secondary | ICD-10-CM

## 2017-09-18 DIAGNOSIS — R002 Palpitations: Secondary | ICD-10-CM | POA: Diagnosis not present

## 2017-09-18 DIAGNOSIS — I10 Essential (primary) hypertension: Secondary | ICD-10-CM | POA: Diagnosis not present

## 2017-09-18 DIAGNOSIS — Z79899 Other long term (current) drug therapy: Secondary | ICD-10-CM

## 2017-09-18 DIAGNOSIS — R0609 Other forms of dyspnea: Secondary | ICD-10-CM

## 2017-09-18 DIAGNOSIS — R0600 Dyspnea, unspecified: Secondary | ICD-10-CM

## 2017-09-18 MED ORDER — TORSEMIDE 20 MG PO TABS: ORAL_TABLET | ORAL | 3 refills | Status: DC

## 2017-09-18 NOTE — Progress Notes (Signed)
SUBJECTIVE: The patient presents for follow-up of progressive exertional dyspnea and bilateral leg edema.  Echocardiogram demonstrated normal left ventricular systolic function and regional wall motion, LVEF 55%, mild LVH, and grade 2 diastolic dysfunction with elevated filling pressures.  BUN 20 and creatinine 2.01 on 09/03/17.  Repeat basic metabolic panel on 54/27/06 showed BUN 24, creatinine 1.64.  Weight down 4 pounds since 08/29/17.  She is feeling much better.  She still has some exertional dyspnea but it has diminished in intensity.  Her leg swelling has gone down considerably.  Her blood pressure is normal.  She is now following with nephrology.   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  Allergies  Allergen Reactions  . Diclofenac Other (See Comments)    hallucination  . Oxycodone     Vomiting     Current Outpatient Medications  Medication Sig Dispense Refill  . ACCU-CHEK FASTCLIX LANCETS MISC Three times daily dx e11.65 150 each 5  . azelastine (ASTELIN) 0.1 % nasal spray Place 2 sprays into both nostrils 2 (two) times daily. Use in each nostril as directed 90 mL 1  . Blood Glucose Monitoring Suppl (FREESTYLE LITE) DEVI   0  . cyclobenzaprine (FLEXERIL) 10 MG tablet Take 1 tablet (10 mg total) by mouth 3 (three) times daily as needed for muscle spasms. 30 tablet 0  . diltiazem (TIAZAC) 360 MG 24 hr capsule TAKE 1 CAPSULE BY MOUTH DAILY. 90 capsule PRN  . Docusate Calcium (STOOL SOFTENER PO) Take by mouth daily.    Marland Kitchen estradiol (VIVELLE-DOT) 0.0375 MG/24HR Place 1 patch onto the skin 3 (three) times a week. Monday, Wednesday, and Saturday.  12  . ferrous sulfate 325 (65 FE) MG tablet Take 325 mg by mouth daily with breakfast.    . fluticasone (FLONASE) 50 MCG/ACT nasal spray Place 2 sprays into both nostrils daily. 48 g 1  . glucose blood (ACCU-CHEK GUIDE) test strip Three  times daily testing dx e11.65 150 each 5  . glucose blood (FREESTYLE TEST STRIPS) test strip Three times daily testing dx e11.65 100 each 12  . insulin glargine (LANTUS) 100 unit/mL SOPN Inject 0.6 mLs (60 Units total) into the skin at bedtime. 15 pen 3  . Insulin Lispro Prot & Lispro (HUMALOG MIX 75/25 KWIKPEN) (75-25) 100 UNIT/ML Kwikpen Inject 12 units in the morning, 10 units at lunchtime, and 12 units at bedtime 45 mL 3  . KLOR-CON M10 10 MEQ tablet TAKE 1 TABLET BY MOUTH 3 TIMES DAILY. 270 tablet 1  . Lancets (FREESTYLE) lancets Use as instructed three times daily dx e11.65 100 each 12  . liraglutide 18 MG/3ML SOPN Inject 0.3 mLs (1.8 mg total) into the skin daily. 90 mL 3  . loratadine (CLARITIN) 10 MG tablet Take 10 mg by mouth daily as needed for allergies.     Marland Kitchen losartan (COZAAR) 100 MG tablet TAKE 1 TABLET BY MOUTH ONCE DAILY 90 tablet 1  . NOVOLOG FLEXPEN 100 UNIT/ML FlexPen   98  . ondansetron (ZOFRAN) 4 MG tablet Take 1 tablet (4 mg total) by mouth 2 (two) times daily as needed for nausea or vomiting. 30 tablet 0  . pantoprazole (PROTONIX) 20 MG tablet TAKE 1 TABLET BY MOUTH EVERY DAY 90 tablet 1  . pravastatin (PRAVACHOL) 40 MG tablet TAKE 1 TABLET BY MOUTH DAILY. 90 tablet 1  . pregabalin (LYRICA) 50 MG capsule  Take 50 mg 2 (two) times daily by mouth.    Marland Kitchen PREMARIN vaginal cream   10  . terconazole (TERAZOL 7) 0.4 % vaginal cream   0  . torsemide (DEMADEX) 20 MG tablet Take 1 tablet (20 mg total) by mouth daily. 90 tablet 3  . UNABLE TO FIND Diabetes shoes x 1 and inserts x 3 Dx:E11.29 1 each 0  . UNABLE TO FIND Compression hose (knee high) 15-23mm 1 pair Dx bilateral leg swelling 1 each 0  . UNIFINE PENTIPS 31G X 8 MM MISC USE AS DIRECTED 100 each 3  . vitamin B-12 (CYANOCOBALAMIN) 100 MCG tablet Take 100 mcg by mouth daily.     No current facility-administered medications for this visit.     Past Medical History:  Diagnosis Date  . Anemia   . Fibroids    Uterine  .  GERD (gastroesophageal reflux disease)   . Hyperlipidemia 2008   Lipid profile in 04/2011:136, 53, 43  . Hypertension 2008   Normal CBC and CMet in 2012; negative stress nuclear in 2006- patient asymptomatic  . Insulin dependent diabetes mellitus ([employer]) 1996  . Multiple allergies    perennial  . Normocytic normochromic anemia 12/27/2015  . Obesity   . PONV (postoperative nausea and vomiting)   . Sleep apnea    CPAP    Past Surgical History:  Procedure Laterality Date  . DENTAL SURGERY    . REFRACTIVE SURGERY  2011   Bilateral, two seperate occasions first in 2006  . REMOVAL OF IMPLANT    . RETINAL DETACHMENT SURGERY Bilateral 05/2005    Social History   Socioeconomic History  . Marital status: Married    Spouse name: Not on file  . Number of children: Not on file  . Years of education: Not on file  . Highest education level: Not on file  Social Needs  . Financial resource strain: Not on file  . Food insecurity - worry: Not on file  . Food insecurity - inability: Not on file  . Transportation needs - medical: Not on file  . Transportation needs - non-medical: Not on file  Occupational History  . Occupation: Employed    Employer: [employer]  Tobacco Use  . Smoking status: Never Smoker  . Smokeless tobacco: Never Used  Substance and Sexual Activity  . Alcohol use: No  . Drug use: No  . Sexual activity: Yes    Birth control/protection: None, Implant  Other Topics Concern  . Not on file  Social History Narrative   Married with 2 children     Vitals:   09/18/17 1619  BP: 122/64  Pulse: 82  SpO2: 98%  Weight: (!) 319 lb (144.7 kg)  Height: 5\' 5"  (1.651 m)    Wt Readings from Last 3 Encounters:  09/18/17 (!) 319 lb (144.7 kg)  08/29/17 (!) 323 lb (146.5 kg)  08/28/17 (!) 322 lb (146.1 kg)     PHYSICAL EXAM General: NAD HEENT: Normal. Neck: No JVD, no thyromegaly. Lungs: Clear to auscultation bilaterally with normal respiratory effort. CV: Regular rate  and rhythm, normal S1/S2, no S3/S4, no murmur. Trace-1+ bilateral pitting pretibial edema.   Abdomen: Soft, nontender, no distention.  Neurologic: Alert and oriented.  Psych: Normal affect. Skin: Normal. Musculoskeletal: No gross deformities.    ECG: Most recent ECG reviewed.   Labs: Lab Results  Component Value Date/Time   K 4.6 09/11/2017 04:12 PM   BUN 24 09/11/2017 04:12 PM   CREATININE 1.64 (H)  09/11/2017 04:12 PM   ALT 7 09/11/2017 04:12 PM   TSH 1.61 09/11/2017 04:12 PM   HGB 10.9 (L) 08/29/2017 03:16 PM     Lipids: Lab Results  Component Value Date/Time   LDLCALC 103 (H) 04/12/2017 11:21 AM   CHOL 181 04/12/2017 11:21 AM   TRIG 73 04/12/2017 11:21 AM   HDL 63 04/12/2017 11:21 AM       ASSESSMENT AND PLAN:  1. Progressive exertional dyspnea/bilateral leg edema/chronic diastolic heart failure: Symptoms have significantly improved but she still has some exertional dyspnea.  Currently on torsemide 20 mg daily.  I will alternate 20 mg and 40 mg on alternate days.  I will obtain a basic metabolic panel in 1 week. I will likely obtain a Lexiscan Myoview stress test to assess for ischemic heart disease.  She has numerous cardiovascular risk factors as detailed in my last note on 08/29/17.  2. Hypertension: Controlled.  I will monitor given increase in diuretic dosage.  3. Palpitations: Controlled on long-acting diltiazem. No changes.  4. Hypercholesterolemia: Continue statin with pravastatin 40 mg. Lipids previously reviewed.     Disposition: Follow up 6 weeks.   Kate Sable, M.D., F.A.C.C.

## 2017-09-18 NOTE — Patient Instructions (Signed)
Medication Instructions:   Increase Torsemide to 20mg  alternating with 40mg  every other day.  Continue all other medications.    Labwork:  BMET - order given today.  Due in 1 week.  Office will contact with results via phone or letter.    Testing/Procedures:  Your physician has requested that you have a lexiscan myoview. For further information please visit [email or web address]. Please follow instruction sheet, as given.  Office will contact with results via phone or letter.    Follow-Up: 6 weeks   Any Other Special Instructions Will Be Listed Below (If Applicable).  If you need a refill on your cardiac medications before your next appointment, please call your pharmacy.

## 2017-09-20 ENCOUNTER — Telehealth: Payer: Self-pay | Admitting: Cardiovascular Disease

## 2017-09-20 NOTE — Telephone Encounter (Signed)
Pre-cert Verification for the following procedure   LEXISCAN MYOVIEW  Scheduled for 10-02-17 at [facility]

## 2017-09-25 ENCOUNTER — Other Ambulatory Visit: Payer: Self-pay | Admitting: Family Medicine

## 2017-09-25 MED FILL — DILTIAZEM HCL ER 360 MG CAP: 360 | 90 days supply | Qty: 90 | Fill #0 | Status: TO

## 2017-09-25 MED FILL — PROGESTERONE 100 MG CAPSULE: 100 | 30 days supply | Qty: 30 | Fill #9 | Status: TO

## 2017-09-25 MED FILL — HUMALOG MIX 75-25 KWIKPEN: (75-25) 100 | 88 days supply | Qty: 30 | Fill #3 | Status: TO

## 2017-09-25 MED FILL — PRAVASTATIN NA 40 MG TAB: 40 | 90 days supply | Qty: 90 | Fill #0 | Status: TO

## 2017-09-25 MED FILL — VICTOZA 18 MG/3 ML INJECT P: 18 | 30 days supply | Qty: 9 | Fill #9 | Status: TO

## 2017-09-25 MED FILL — POTASSIUM CL 10 MEQ TAB SA: 10 | 90 days supply | Qty: 270 | Fill #1

## 2017-09-25 MED FILL — ACCU-CHEK GUIDE TEST STRIP: 50 days supply | Qty: 150 | Fill #5

## 2017-09-25 MED FILL — ACCU-CHEK FASTCLIX LANCETS: 30 days supply | Qty: 102 | Fill #5

## 2017-09-28 MED FILL — ESTRADIOL PATCH 0.0375: 0.0375 | 28 days supply | Qty: 8 | Fill #0

## 2017-10-02 ENCOUNTER — Encounter (HOSPITAL_COMMUNITY): Payer: Self-pay

## 2017-10-02 ENCOUNTER — Ambulatory Visit (HOSPITAL_COMMUNITY)
Admission: RE | Admit: 2017-10-02 | Discharge: 2017-10-02 | Disposition: A | Discharge status: Home / Self Care | Payer: 59 | Source: Ambulatory Visit | Attending: Cardiovascular Disease | Admitting: Cardiovascular Disease

## 2017-10-02 ENCOUNTER — Encounter (HOSPITAL_COMMUNITY)
Admission: RE | Admit: 2017-10-02 | Discharge: 2017-10-02 | Disposition: A | Discharge status: Home / Self Care | Payer: 59 | Source: Ambulatory Visit | Attending: Cardiovascular Disease | Admitting: Cardiovascular Disease

## 2017-10-02 DIAGNOSIS — R0600 Dyspnea, unspecified: Secondary | ICD-10-CM

## 2017-10-02 DIAGNOSIS — R0609 Other forms of dyspnea: Secondary | ICD-10-CM | POA: Diagnosis not present

## 2017-10-02 LAB — BASIC METABOLIC PANEL
BUN/Creatinine Ratio: 16 (calc) (ref 6–22)
BUN: 28 mg/dL — ABNORMAL HIGH (ref 7–25)
CO2: 29 mmol/L (ref 20–32)
Calcium: 9.1 mg/dL (ref 8.6–10.4)
Chloride: 102 mmol/L (ref 98–110)
Creat: 1.72 mg/dL — ABNORMAL HIGH (ref 0.50–1.05)
Glucose, Bld: 190 mg/dL — ABNORMAL HIGH (ref 65–139)
Potassium: 4.5 mmol/L (ref 3.5–5.3)
Sodium: 139 mmol/L (ref 135–146)

## 2017-10-02 LAB — NM MYOCAR MULTI W/SPECT W/WALL MOTION / EF
LV dias vol: 94 mL (ref 46–106)
LV sys vol: 42 mL
Peak HR: 102 {beats}/min
RATE: 0.53
Rest HR: 83 {beats}/min
SDS: 0
SRS: 0
SSS: 0
TID: 1.03

## 2017-10-02 MED ORDER — TECHNETIUM TC 99M TETROFOSMIN IV KIT
30.0000 | PACK | Freq: Once | INTRAVENOUS | Status: AC | PRN
  Administered 2017-10-02: 33 via INTRAVENOUS

## 2017-10-02 MED ORDER — SODIUM CHLORIDE 0.9% FLUSH
INTRAVENOUS | Status: AC
  Administered 2017-10-02: 10 mL via INTRAVENOUS
  Filled 2017-10-02: qty 10

## 2017-10-02 MED ORDER — TECHNETIUM TC 99M TETROFOSMIN IV KIT
10.0000 | PACK | Freq: Once | INTRAVENOUS | Status: AC | PRN
  Administered 2017-10-02: 10.5 via INTRAVENOUS

## 2017-10-02 MED ORDER — REGADENOSON 0.4 MG/5ML IV SOLN
INTRAVENOUS | Status: AC
  Administered 2017-10-02: 0.4 mg via INTRAVENOUS
  Filled 2017-10-02: qty 5

## 2017-10-03 ENCOUNTER — Other Ambulatory Visit: Payer: Self-pay

## 2017-10-03 ENCOUNTER — Telehealth: Payer: Self-pay | Admitting: Family Medicine

## 2017-10-03 MED ORDER — INSULIN GLARGINE 100 UNITS/ML SOLOSTAR PEN: 60.0000 [IU] | PEN_INJECTOR | Freq: Every day | SUBCUTANEOUS | 3 refills | Status: DC

## 2017-10-03 NOTE — Telephone Encounter (Signed)
Patient is requesting a refill for lantus

## 2017-10-03 NOTE — Telephone Encounter (Signed)
Sent in

## 2017-10-08 ENCOUNTER — Ambulatory Visit (HOSPITAL_COMMUNITY)
Admission: RE | Admit: 2017-10-08 | Discharge: 2017-10-08 | Disposition: A | Discharge status: Home / Self Care | Payer: 59 | Source: Ambulatory Visit | Attending: Nephrology | Admitting: Nephrology

## 2017-10-08 DIAGNOSIS — N183 Chronic kidney disease, stage 3 unspecified: Secondary | ICD-10-CM

## 2017-10-08 DIAGNOSIS — E119 Type 2 diabetes mellitus without complications: Secondary | ICD-10-CM | POA: Diagnosis not present

## 2017-10-11 ENCOUNTER — Other Ambulatory Visit: Payer: Self-pay | Admitting: *Deleted

## 2017-10-11 NOTE — Patient Outreach (Signed)
Laria transitioned from the [employer] [facility] program to the [employer] on 12/2216 for Type II diabetes self-management assistance so will close case to the diabetes [facility] program due to delegation of disease management services to [employer] from [employer] for [place] members services in 2019. Barrington Ellison RN,CCM,CDE [place] Management Coordinator [facility] and [employer] [phone] Office Fax [phone]

## 2017-11-07 ENCOUNTER — Ambulatory Visit: Payer: 59 | Admitting: Cardiovascular Disease

## 2017-11-15 ENCOUNTER — Telehealth: Payer: Self-pay | Admitting: Family Medicine

## 2017-11-15 ENCOUNTER — Other Ambulatory Visit: Payer: Self-pay

## 2017-11-15 MED ORDER — LIRAGLUTIDE 18 MG/3ML ~~LOC~~ SOPN: 1.8000 mg | PEN_INJECTOR | Freq: Every day | SUBCUTANEOUS | 3 refills | Status: DC

## 2017-11-15 NOTE — Telephone Encounter (Signed)
Victosa ---insulin [phone], called [facility]

## 2017-11-15 NOTE — Telephone Encounter (Signed)
Refill sent.

## 2017-11-16 ENCOUNTER — Telehealth: Payer: Self-pay | Admitting: Family Medicine

## 2017-11-16 ENCOUNTER — Other Ambulatory Visit: Payer: Self-pay

## 2017-11-16 ENCOUNTER — Telehealth: Payer: Self-pay

## 2017-11-16 NOTE — Telephone Encounter (Signed)
Entered in error

## 2017-11-16 NOTE — Telephone Encounter (Signed)
Patient called out of refills of victoza. Refill sent and needs PA. Her Oct note said she needed to be referred to dr nida and no referral was ever placed. Do you want me to take care of this? Please advise

## 2017-11-16 NOTE — Telephone Encounter (Signed)
Requesting labs to be faxed to [facility]

## 2017-11-19 ENCOUNTER — Other Ambulatory Visit: Payer: Self-pay

## 2017-11-19 ENCOUNTER — Encounter: Payer: Self-pay | Admitting: Family Medicine

## 2017-11-19 MED ORDER — LIRAGLUTIDE 18 MG/3ML ~~LOC~~ SOPN: 1.8000 mg | PEN_INJECTOR | Freq: Every day | SUBCUTANEOUS | 3 refills | Status: DC

## 2017-11-19 NOTE — Telephone Encounter (Signed)
This pt doesn't have any ordered labs. Re ordered her husbands to [facility]

## 2017-11-20 ENCOUNTER — Other Ambulatory Visit: Payer: Self-pay

## 2017-11-20 DIAGNOSIS — J302 Other seasonal allergic rhinitis: Secondary | ICD-10-CM

## 2017-11-20 MED ORDER — POTASSIUM CHLORIDE CRYS ER 10 MEQ PO TBCR: 10.0000 meq | EXTENDED_RELEASE_TABLET | Freq: Three times a day (TID) | ORAL | 1 refills | Status: DC

## 2017-11-20 MED ORDER — DILTIAZEM HCL ER BEADS 360 MG PO CP24: 360.0000 mg | ORAL_CAPSULE | Freq: Every day | ORAL | 1 refills | Status: DC

## 2017-11-20 MED ORDER — LOSARTAN POTASSIUM 100 MG PO TABS: 100.0000 mg | ORAL_TABLET | Freq: Every day | ORAL | 1 refills | Status: DC

## 2017-11-20 MED ORDER — INSULIN GLARGINE 100 UNITS/ML SOLOSTAR PEN: 60.0000 [IU] | PEN_INJECTOR | Freq: Every day | SUBCUTANEOUS | 3 refills | Status: DC

## 2017-11-20 MED ORDER — PANTOPRAZOLE SODIUM 20 MG PO TBEC: 20.0000 mg | DELAYED_RELEASE_TABLET | Freq: Every day | ORAL | 1 refills | Status: DC

## 2017-11-20 MED ORDER — INSULIN LISPRO PROT & LISPRO (75-25 MIX) 100 UNIT/ML KWIKPEN: PEN_INJECTOR | SUBCUTANEOUS | 3 refills | Status: DC

## 2017-11-20 MED ORDER — PRAVASTATIN SODIUM 40 MG PO TABS: 40.0000 mg | ORAL_TABLET | Freq: Every day | ORAL | 1 refills | Status: DC

## 2017-11-20 MED ORDER — FLUTICASONE PROPIONATE 50 MCG/ACT NA SUSP: 2.0000 | Freq: Every day | NASAL | 1 refills | Status: DC

## 2017-11-23 ENCOUNTER — Other Ambulatory Visit: Payer: Self-pay | Admitting: Family Medicine

## 2017-11-24 LAB — MICROSCOPIC EXAMINATION
Casts: NONE SEEN /lpf
Epithelial Cells (non renal): >10 /hpf — AB (ref 0–10)

## 2017-11-24 LAB — UA/M W/RFLX CULTURE, ROUTINE
Bilirubin, UA: NEGATIVE
Glucose, UA: NEGATIVE
Ketones, UA: NEGATIVE
Leukocytes, UA: NEGATIVE
Nitrite, UA: NEGATIVE
Protein, UA: NEGATIVE
RBC, UA: NEGATIVE
Specific Gravity, UA: 1.014 (ref 1.005–1.030)
Urobilinogen, Ur: 0.2 mg/dL (ref 0.2–1.0)
pH, UA: 5 (ref 5.0–7.5)

## 2017-11-24 LAB — BASIC METABOLIC PANEL
BUN/Creatinine Ratio: 16 (ref 9–23)
BUN: 25 mg/dL — ABNORMAL HIGH (ref 6–24)
CO2: 24 mmol/L (ref 20–29)
Calcium: 9.2 mg/dL (ref 8.7–10.2)
Chloride: 97 mmol/L (ref 96–106)
Creatinine, Ser: 1.54 mg/dL — ABNORMAL HIGH (ref 0.57–1.00)
GFR calc Af Amer: 43 mL/min/{1.73_m2} — ABNORMAL LOW (ref >59)
GFR calc non Af Amer: 37 mL/min/{1.73_m2} — ABNORMAL LOW (ref >59)
Glucose: 180 mg/dL — ABNORMAL HIGH (ref 65–99)
Potassium: 4.2 mmol/L (ref 3.5–5.2)
Sodium: 139 mmol/L (ref 134–144)

## 2017-11-24 LAB — SPECIMEN STATUS REPORT

## 2017-11-24 LAB — IRON: Iron: 57 ug/dL (ref 27–159)

## 2017-11-28 ENCOUNTER — Ambulatory Visit (INDEPENDENT_AMBULATORY_CARE_PROVIDER_SITE_OTHER): Payer: Self-pay | Admitting: Family Medicine

## 2017-11-28 ENCOUNTER — Ambulatory Visit: Payer: 59 | Admitting: Cardiovascular Disease

## 2017-11-28 ENCOUNTER — Other Ambulatory Visit: Payer: Self-pay

## 2017-11-28 ENCOUNTER — Encounter: Payer: Self-pay | Admitting: Family Medicine

## 2017-11-28 VITALS — BP 130/68 | HR 108 | Temp 98.0°F | Resp 16 | Ht 65.0 in | Wt 318.8 lb

## 2017-11-28 DIAGNOSIS — F4323 Adjustment disorder with mixed anxiety and depressed mood: Secondary | ICD-10-CM

## 2017-11-28 DIAGNOSIS — E1121 Type 2 diabetes mellitus with diabetic nephropathy: Secondary | ICD-10-CM

## 2017-11-28 DIAGNOSIS — G5603 Carpal tunnel syndrome, bilateral upper limbs: Secondary | ICD-10-CM

## 2017-11-28 DIAGNOSIS — Z794 Long term (current) use of insulin: Secondary | ICD-10-CM

## 2017-11-28 DIAGNOSIS — E785 Hyperlipidemia, unspecified: Secondary | ICD-10-CM

## 2017-11-28 DIAGNOSIS — I10 Essential (primary) hypertension: Secondary | ICD-10-CM

## 2017-11-28 NOTE — Progress Notes (Signed)
Kendra Hanson     MRN: [id number]      DOB: December 09, 1960   HPI Kendra Hanson is here for follow up and re-evaluation of chronic medical conditions, medication management and review of any available recent lab and radiology data.  Preventive health is updated, specifically  Cancer screening and Immunization.   Ms Hanson is anxious and depressed, states that she has even more left hand and upper extremity pain, now up to neck and shoulders , 1 year post hand surgery. 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 She has also in the past 4 months been dealing with new kidney disease  Unclear trigger, as well as the fact that she has been unemployed , and is likely no longer employable, feels overwhelmed, anxious, depressed, not suicidal or homicidal, however feels the need for therapy and requests this Eating has been out of control and exercise inconsistent, blond sugar uncontrolled Denies polyuria, polydipsia, blurred vision , or hypoglycemic episodes.  ROS Denies recent fever or chills. Denies sinus pressure, nasal congestion, ear pain or sore throat. Denies chest congestion, productive cough or wheezing. Denies chest pains, palpitations and leg swelling Denies abdominal pain, nausea, vomiting,diarrhea or constipation.   Denies dysuria, frequency, hesitancy or incontinence. . Denies headaches, seizures,    PE  BP 130/68 (BP Location: Left Arm, Patient Position: Sitting, Cuff Size: Normal)   Pulse (!) 108   Temp 98 F (36.7 C) (Temporal)   Resp 16   Ht 5\' 5"  (1.651 m)   Wt (!) 318 lb 12 oz (144.6 kg)   SpO2 95%   BMI 53.04 kg/m   Patient alert and oriented and in no cardiopulmonary distress.Pt tearful, crying , c/o uncontrolled pain in left hand and arm HEENT: No facial asymmetry, EOMI,   oropharynx pink and moist.  Neck decreased ROM no JVD, no mass.  Chest: Clear to auscultation  bilaterally.  CVS: S1, S2 no murmurs, no S3.Regular rate.  ABD: Soft non tender.   Ext: No edema  MS: Adequate though reduced  ROM spine, shoulders, hips and knees.  Skin: Intact, no ulcerations or rash noted.  Psych: Good eye contact, normal affect. Memory intact not anxious or depressed appearing.  CNS: CN 2-12 intact, grade 3 power in left hand  Assessment & Plan: Essential hypertension Controlled, no change in medication   Type II diabetes mellitus with renal manifestations Kendra Hanson is reminded of the importance of commitment to daily physical activity for 30 minutes or more, as able and the need to limit carbohydrate intake to 30 to 60 grams per meal to help with blood sugar control.   The need to take medication as prescribed, test blood sugar as directed, and to call between visits if there is a concern that blood sugar is uncontrolled is also discussed.   Kendra Hanson is reminded of the importance of daily foot exam, annual eye examination, and good blood sugar, blood pressure and cholesterol control. Updated lab needed    Diabetic Labs Latest Ref Rng & Units 11/23/2017 10/02/2017 09/11/2017 09/03/2017 08/29/2017  HbA1c <5.7 % of total Hgb - - - - -  Microalbumin Not Estab. ug/mL - - - - -  Micro/Creat Ratio 0.0 - 30.0 mg/g creat - - - - -  Chol <200 mg/dL - - - - -  HDL >50 mg/dL - - - - -  Calc LDL <100 mg/dL - - - - -  Triglycerides <150 mg/dL - - - - -  Creatinine 0.57 - 1.00 mg/dL 1.54(H) 1.72(H) 1.64(H) 2.01(H) 1.54(H)   BP/Weight 11/28/2017 09/18/2017 08/29/2017 08/28/2017 04/23/2017 05/22/6268 02/18/5461  Systolic BP 703 500 938 182 993 716 967  Diastolic BP 68 64 82 68 80 70 72  Wt. (Lbs) 318.75 319 323 322 316.04 310 -  BMI 53.04 53.08 53.75 53.58 52.59 51.59 -   Foot/eye exam completion dates 04/23/2017 07/05/2016  Foot Form Completion Done Done         Morbid obesity improved. Patient re-educated about  the importance of commitment to a  minimum  of 150 minutes of exercise per week.  The importance of healthy food choices with portion control discussed. Encouraged to start a food diary, count calories and to consider  joining a support group. Sample diet sheets offered. Goals set by the patient for the next several months.   Weight /BMI 11/28/2017 09/18/2017 08/29/2017  WEIGHT 318 lb 12 oz 319 lb 323 lb  HEIGHT 5\' 5"  5\' 5"  5\' 5"   BMI 53.04 kg/m2 53.08 kg/m2 53.75 kg/m2      Hyperlipidemia LDL goal <100 Hyperlipidemia:Low fat diet discussed and encouraged.   Lipid Panel  Lab Results  Component Value Date   CHOL 181 04/12/2017   HDL 63 04/12/2017   LDLCALC 103 (H) 04/12/2017   TRIG 73 04/12/2017   CHOLHDL 2.9 04/12/2017   Updated lab needed at/ before next visit.    Carpal tunnel syndrome Reports uncontrolled and worse left hand pain following surgery in 2018, however , has established C spine disease and symptoms also involve neck and both shoulders and upper extremities, being closely followed by ortho  Adjustment reaction with anxiety and depression Uncontrolled anxiety and depression due to multiple medical illnesses with debility and inability to work and  Loss of job, with no improvement in symptoms 1 year post surgery and new medical challenges. Needs and requests therapy. Not suicidal or homicidal, very faith based and spiritual, will refer to psychiatry for therapy, female therapist preferred

## 2017-11-28 NOTE — Patient Instructions (Addendum)
F/u in 4 months, call if you need me before  I will send message re HBA1C  Change eating   Pls check with dr Birdie Sons re safety as far as gabapentin and lyrica are concerned and discuss possibility of these for pain with hand surgeon  You will be referred to therapist for help with anxiety and mild depression as you cope with your current health challenges, as we discussed

## 2017-11-30 LAB — HEPATIC FUNCTION PANEL
ALT: 11 IU/L (ref 0–32)
AST: 9 IU/L (ref 0–40)
Albumin: 4.2 g/dL (ref 3.5–5.5)
Alkaline Phosphatase: 133 IU/L — ABNORMAL HIGH (ref 39–117)
Bilirubin Total: 0.2 mg/dL (ref 0.0–1.2)
Bilirubin, Direct: 0.09 mg/dL (ref 0.00–0.40)
Total Protein: 7.4 g/dL (ref 6.0–8.5)

## 2017-11-30 LAB — LIPID PANEL W/O CHOL/HDL RATIO
Cholesterol, Total: 194 mg/dL (ref 100–199)
HDL: 50 mg/dL (ref >39)
LDL Calculated: 120 mg/dL — ABNORMAL HIGH (ref 0–99)
Triglycerides: 120 mg/dL (ref 0–149)
VLDL Cholesterol Cal: 24 mg/dL (ref 5–40)

## 2017-11-30 LAB — SPECIMEN STATUS REPORT

## 2017-12-02 ENCOUNTER — Encounter: Payer: Self-pay | Admitting: Family Medicine

## 2017-12-02 DIAGNOSIS — F4323 Adjustment disorder with mixed anxiety and depressed mood: Secondary | ICD-10-CM | POA: Insufficient documentation

## 2017-12-02 NOTE — Assessment & Plan Note (Signed)
improved. Patient re-educated about  the importance of commitment to a  minimum of 150 minutes of exercise per week.  The importance of healthy food choices with portion control discussed. Encouraged to start a food diary, count calories and to consider  joining a support group. Sample diet sheets offered. Goals set by the patient for the next several months.   Weight /BMI 11/28/2017 09/18/2017 08/29/2017  WEIGHT 318 lb 12 oz 319 lb 323 lb  HEIGHT 5\' 5"  5\' 5"  5\' 5"   BMI 53.04 kg/m2 53.08 kg/m2 53.75 kg/m2

## 2017-12-02 NOTE — Assessment & Plan Note (Addendum)
Reports uncontrolled and worse left hand pain following surgery in 2018, however , has established C spine disease and symptoms also involve neck and both shoulders and upper extremities, being closely followed by ortho

## 2017-12-02 NOTE — Assessment & Plan Note (Addendum)
Kendra Hanson is reminded of the importance of commitment to daily physical activity for 30 minutes or more, as able and the need to limit carbohydrate intake to 30 to 60 grams per meal to help with blood sugar control.   The need to take medication as prescribed, test blood sugar as directed, and to call between visits if there is a concern that blood sugar is uncontrolled is also discussed.   Kendra Hanson is reminded of the importance of daily foot exam, annual eye examination, and good blood sugar, blood pressure and cholesterol control. Updated lab needed    Diabetic Labs Latest Ref Rng & Units 11/23/2017 10/02/2017 09/11/2017 09/03/2017 08/29/2017  HbA1c <5.7 % of total Hgb - - - - -  Microalbumin Not Estab. ug/mL - - - - -  Micro/Creat Ratio 0.0 - 30.0 mg/g creat - - - - -  Chol <200 mg/dL - - - - -  HDL >50 mg/dL - - - - -  Calc LDL <100 mg/dL - - - - -  Triglycerides <150 mg/dL - - - - -  Creatinine 0.57 - 1.00 mg/dL 1.54(H) 1.72(H) 1.64(H) 2.01(H) 1.54(H)   BP/Weight 11/28/2017 09/18/2017 08/29/2017 08/28/2017 04/23/2017 4/00/8676 11/22/5091  Systolic BP 267 124 580 998 338 250 539  Diastolic BP 68 64 82 68 80 70 72  Wt. (Lbs) 318.75 319 323 322 316.04 310 -  BMI 53.04 53.08 53.75 53.58 52.59 51.59 -   Foot/eye exam completion dates 04/23/2017 07/05/2016  Foot Form Completion Done Done

## 2017-12-02 NOTE — Assessment & Plan Note (Signed)
Controlled, no change in medication  

## 2017-12-02 NOTE — Assessment & Plan Note (Signed)
Hyperlipidemia:Low fat diet discussed and encouraged.   Lipid Panel  Lab Results  Component Value Date   CHOL 181 04/12/2017   HDL 63 04/12/2017   LDLCALC 103 (H) 04/12/2017   TRIG 73 04/12/2017   CHOLHDL 2.9 04/12/2017   Updated lab needed at/ before next visit.

## 2017-12-02 NOTE — Assessment & Plan Note (Signed)
Uncontrolled anxiety and depression due to multiple medical illnesses with debility and inability to work and  Loss of job, with no improvement in symptoms 1 year post surgery and new medical challenges. Needs and requests therapy. Not suicidal or homicidal, very faith based and spiritual, will refer to psychiatry for therapy, female therapist preferred

## 2017-12-04 ENCOUNTER — Ambulatory Visit (INDEPENDENT_AMBULATORY_CARE_PROVIDER_SITE_OTHER): Payer: Commercial Managed Care - PPO | Admitting: Cardiovascular Disease

## 2017-12-04 ENCOUNTER — Encounter: Payer: Self-pay | Admitting: Cardiovascular Disease

## 2017-12-04 VITALS — BP 118/64 | HR 94 | Ht 65.0 in | Wt 318.0 lb

## 2017-12-04 DIAGNOSIS — E782 Mixed hyperlipidemia: Secondary | ICD-10-CM

## 2017-12-04 DIAGNOSIS — R600 Localized edema: Secondary | ICD-10-CM | POA: Diagnosis not present

## 2017-12-04 DIAGNOSIS — R0609 Other forms of dyspnea: Secondary | ICD-10-CM | POA: Diagnosis not present

## 2017-12-04 DIAGNOSIS — I5032 Chronic diastolic (congestive) heart failure: Secondary | ICD-10-CM

## 2017-12-04 DIAGNOSIS — I10 Essential (primary) hypertension: Secondary | ICD-10-CM

## 2017-12-04 DIAGNOSIS — R0600 Dyspnea, unspecified: Secondary | ICD-10-CM

## 2017-12-04 NOTE — Patient Instructions (Signed)
Your physician wants you to follow-up in: 6 months with Dr Koneswaran You will receive a reminder letter in the mail two months in advance. If you don't receive a letter, please call our office to schedule the follow-up appointment.    Your physician recommends that you continue on your current medications as directed. Please refer to the Current Medication list given to you today.    If you need a refill on your cardiac medications before your next appointment, please call your pharmacy.      No tests ordered today.       Thank you for choosing [facility] !        

## 2017-12-04 NOTE — Progress Notes (Signed)
SUBJECTIVE: The patient presents for follow-up of progressive exertional dyspnea and bilateral leg edema.  Echocardiogram demonstrated normal left ventricular systolic function and regional wall motion, LVEF 55%, mild LVH, and grade 2 diastolic dysfunction with elevated filling pressures.  Nuclear stress test on 10/02/17 was normal.  She feels like her exertional dyspnea is improving.  Nephrology has adjusted her diuretics and she now takes 60 mg and 40 mg on alternate days.  She denies chest pain and palpitations.  She was doing water aerobics in the spring but has not done any this winter.  Her leg swelling has also improved.    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 Her husband is also my patient.   Review of Systems: As per "subjective", otherwise negative.  Allergies  Allergen Reactions  . Nsaids     Kidney function   . Diclofenac Other (See Comments)    hallucination  . Oxycodone     Vomiting     Current Outpatient Medications  Medication Sig Dispense Refill  . ACCU-CHEK FASTCLIX LANCETS MISC Three times daily dx e11.65 150 each 5  . azelastine (ASTELIN) 0.1 % nasal spray Place 2 sprays into both nostrils 2 (two) times daily. Use in each nostril as directed 90 mL 1  . Blood Glucose Monitoring Suppl (FREESTYLE LITE) DEVI   0  . cyclobenzaprine (FLEXERIL) 10 MG tablet Take 1 tablet (10 mg total) by mouth 3 (three) times daily as needed for muscle spasms. 30 tablet 0  . diltiazem (TIAZAC) 360 MG 24 hr capsule Take 1 capsule (360 mg total) by mouth daily. 90 capsule 1  . Docusate Calcium (STOOL SOFTENER PO) Take by mouth daily.    Marland Kitchen estradiol (VIVELLE-DOT) 0.0375 MG/24HR Place 1 patch onto the skin 3 (three) times a week. Monday, Wednesday, and Saturday.  12  . ferrous sulfate 325 (65 FE) MG tablet Take 325 mg by mouth daily with breakfast.    . fluticasone (FLONASE) 50 MCG/ACT nasal spray Place 2 sprays into both nostrils daily. 48 g 1  .  glucose blood (ACCU-CHEK GUIDE) test strip Three times daily testing dx e11.65 150 each 5  . glucose blood (FREESTYLE TEST STRIPS) test strip Three times daily testing dx e11.65 100 each 12  . insulin glargine (LANTUS) 100 unit/mL SOPN Inject 0.6 mLs (60 Units total) into the skin at bedtime. 15 pen 3  . Insulin Lispro Prot & Lispro (HUMALOG MIX 75/25 KWIKPEN) (75-25) 100 UNIT/ML Kwikpen Inject 12 units in the morning, 10 units at lunchtime, and 12 units at bedtime 45 mL 3  . Lancets (FREESTYLE) lancets Use as instructed three times daily dx e11.65 100 each 12  . liraglutide (VICTOZA) 18 MG/3ML SOPN Inject 0.3 mLs (1.8 mg total) into the skin daily. 90 mL 3  . loratadine (CLARITIN) 10 MG tablet Take 10 mg by mouth daily as needed for allergies.     Marland Kitchen losartan (COZAAR) 100 MG tablet Take 1 tablet (100 mg total) by mouth daily. 90 tablet 1  . NOVOLOG FLEXPEN 100 UNIT/ML FlexPen   98  . ondansetron (ZOFRAN) 4 MG tablet Take 1 tablet (4 mg total) by mouth 2 (two) times daily as needed for nausea or vomiting. 30 tablet 0  . pantoprazole (PROTONIX) 20 MG tablet Take 1 tablet (20 mg total) by mouth daily. 90 tablet 1  . potassium chloride (KLOR-CON M10) 10 MEQ tablet Take 1 tablet (10 mEq total) by mouth 3 (three)  times daily. 270 tablet 1  . pravastatin (PRAVACHOL) 40 MG tablet Take 1 tablet (40 mg total) by mouth daily. 90 tablet 1  . pregabalin (LYRICA) 50 MG capsule Take 50 mg 2 (two) times daily by mouth.    Marland Kitchen PREMARIN vaginal cream   10  . terconazole (TERAZOL 7) 0.4 % vaginal cream   0  . torsemide (DEMADEX) 20 MG tablet Take one tab by mouth (20mg ) every other day, alternating with two tabs (40mg ) every other day 135 tablet 3  . UNABLE TO FIND Diabetes shoes x 1 and inserts x 3 Dx:E11.29 1 each 0  . UNABLE TO FIND Compression hose (knee high) 15-19mm 1 pair Dx bilateral leg swelling 1 each 0  . UNIFINE PENTIPS 31G X 8 MM MISC USE AS DIRECTED 100 each 3  . vitamin B-12 (CYANOCOBALAMIN) 100 MCG  tablet Take 100 mcg by mouth daily.     No current facility-administered medications for this visit.     Past Medical History:  Diagnosis Date  . Anemia   . Fibroids    Uterine  . GERD (gastroesophageal reflux disease)   . Hyperlipidemia 2008   Lipid profile in 04/2011:136, 53, 43  . Hypertension 2008   Normal CBC and CMet in 2012; negative stress nuclear in 2006- patient asymptomatic  . Insulin dependent diabetes mellitus ([place]) 1996  . Multiple allergies    perennial  . Normocytic normochromic anemia 12/27/2015  . Obesity   . PONV (postoperative nausea and vomiting)   . Sleep apnea    CPAP    Past Surgical History:  Procedure Laterality Date  . COLONOSCOPY N/A 04/05/2015   Procedure: COLONOSCOPY;  Surgeon: Rogene Houston, MD;  Location: [facility];  Service: Endoscopy;  Laterality: N/A;  730  . DENTAL SURGERY    . ESOPHAGOGASTRODUODENOSCOPY N/A 01/21/2016   Procedure: ESOPHAGOGASTRODUODENOSCOPY (EGD);  Surgeon: Rogene Houston, MD;  Location: [facility];  Service: Endoscopy;  Laterality: N/A;  11:15  . REFRACTIVE SURGERY  2011   Bilateral, two seperate occasions first in 2006  . REMOVAL OF IMPLANT    . RETINAL DETACHMENT SURGERY Bilateral 05/2005    Social History   Socioeconomic History  . Marital status: Married    Spouse name: Not on file  . Number of children: Not on file  . Years of education: Not on file  . Highest education level: Not on file  Social Needs  . Financial resource strain: Not on file  . Food insecurity - worry: Not on file  . Food insecurity - inability: Not on file  . Transportation needs - medical: Not on file  . Transportation needs - non-medical: Not on file  Occupational History  . Occupation: Employed    Employer: [employer]  Tobacco Use  . Smoking status: Never Smoker  . Smokeless tobacco: Never Used  Substance and Sexual Activity  . Alcohol use: No  . Drug use: No  . Sexual activity: Yes    Birth control/protection: None,  Implant  Other Topics Concern  . Not on file  Social History Narrative   Married with 2 children     Vitals:   12/04/17 1618  BP: 118/64  Pulse: 94  SpO2: 91%  Weight: (!) 318 lb (144.2 kg)  Height: 5\' 5"  (1.651 m)    Wt Readings from Last 3 Encounters:  12/04/17 (!) 318 lb (144.2 kg)  11/28/17 (!) 318 lb 12 oz (144.6 kg)  09/18/17 (!) 319 lb (144.7 kg)  PHYSICAL EXAM General: NAD HEENT: Normal. Neck: No JVD, no thyromegaly. Lungs: Clear to auscultation bilaterally with normal respiratory effort. CV: Regular rate and rhythm, normal S1/S2, no S3/S4, no murmur.  Chronic trace to 1+ pitting bilateral lower extremity and peri-ankle edema edema.  No carotid bruit.   Abdomen: Soft, nontender, protuberant.  Neurologic: Alert and oriented.  Psych: Normal affect. Skin: Normal. Musculoskeletal: No gross deformities.    ECG: Most recent ECG reviewed.   Labs: Lab Results  Component Value Date/Time   K 4.2 11/23/2017 04:31 PM   BUN 25 (H) 11/23/2017 04:31 PM   CREATININE 1.54 (H) 11/23/2017 04:31 PM   CREATININE 1.72 (H) 10/02/2017 11:44 AM   ALT 11 11/23/2017 04:31 PM   TSH 1.61 09/11/2017 04:12 PM   HGB 10.9 (L) 08/29/2017 03:16 PM     Lipids: Lab Results  Component Value Date/Time   LDLCALC 120 (H) 11/23/2017 04:31 PM   CHOL 194 11/23/2017 04:31 PM   TRIG 120 11/23/2017 04:31 PM   HDL 50 11/23/2017 04:31 PM       ASSESSMENT AND PLAN: 1. Progressive exertional dyspnea/bilateral leg edema/chronic diastolic heart failure: Symptomatically improved.  Weight is stable.  She is on alternating doses of torsemide with 60 mg and 40 mg on alternate days.  She is also followed by nephrology. I spoke to her at length about ways of improving diastolic function including exercise and weight loss.  I encouraged her to continue with water aerobics.  2. Hypertension: Controlled.  No changes to therapy.  3. Palpitations: Controlled on long-acting diltiazem. No  changes.  4. Hypercholesterolemia: Continue pravastatin 40 mg.  Lipids on 11/23/17 showed total cholesterol 194, triglycerides 120, HDL 50, LDL 120.    Disposition: Follow up 6 months   Kate Sable, M.D., F.A.C.C.

## 2017-12-05 ENCOUNTER — Encounter: Payer: Self-pay | Admitting: Family Medicine

## 2017-12-05 LAB — HEMOGLOBIN A1C
Est. average glucose Bld gHb Est-mCnc: 189 mg/dL
Hgb A1c MFr Bld: 8.2 % — ABNORMAL HIGH (ref 4.8–5.6)

## 2017-12-10 ENCOUNTER — Ambulatory Visit (HOSPITAL_COMMUNITY)
Admission: RE | Admit: 2017-12-10 | Discharge: 2017-12-10 | Disposition: A | Discharge status: Home / Self Care | Payer: Commercial Managed Care - PPO | Source: Ambulatory Visit | Attending: Obstetrics and Gynecology | Admitting: Obstetrics and Gynecology

## 2017-12-10 ENCOUNTER — Other Ambulatory Visit (HOSPITAL_COMMUNITY): Payer: Self-pay | Admitting: Obstetrics and Gynecology

## 2017-12-10 DIAGNOSIS — Z1231 Encounter for screening mammogram for malignant neoplasm of breast: Secondary | ICD-10-CM | POA: Diagnosis present

## 2017-12-11 ENCOUNTER — Telehealth: Payer: Self-pay | Admitting: Family Medicine

## 2017-12-11 ENCOUNTER — Encounter: Payer: Self-pay | Admitting: Family Medicine

## 2017-12-11 NOTE — Telephone Encounter (Signed)
Emailed ava stephenson at [facility] to check the status of referral per Dr.Simpson

## 2017-12-25 MED FILL — ESTRADIOL 0.0375 MG/24HR PT: 0.0375 | 28 days supply | Qty: 8 | Fill #0

## 2017-12-25 MED FILL — PROGESTERONE 100 MG CAPSULE: 100 | 30 days supply | Qty: 30 | Fill #0

## 2018-01-08 NOTE — Progress Notes (Deleted)
Psychiatric Initial Adult Assessment   Patient Identification: Kendra Hanson MRN:  [id number] Date of Evaluation:  01/08/2018 Referral Source: *** Chief Complaint:   Visit Diagnosis: No diagnosis found.  History of Present Illness:   Kendra Hanson is a 57 y.o. year old female with a history of adjustment disorder, hypertension, diabetes, chronic diastolic heart failure, obesity, who is referred for    multiple medical illnesses with debility and inability to work and  Loss of job, with no improvement in symptoms 1 year post surgery and new medical challenges.    Associated Signs/Symptoms: Depression Symptoms:  {DEPRESSION SYMPTOMS:20000} (Hypo) Manic Symptoms:  {BHH MANIC SYMPTOMS:22872} Anxiety Symptoms:  {BHH ANXIETY SYMPTOMS:22873} Psychotic Symptoms:  {BHH PSYCHOTIC SYMPTOMS:22874} PTSD Symptoms: {BHH PTSD SYMPTOMS:22875}  Past Psychiatric History:  Outpatient:  Psychiatry admission:  Previous suicide attempt:  Past trials of medication:  History of violence:   Previous Psychotropic Medications: {YES/NO:21197}  Substance Abuse History in the last 12 months:  {yes no:314532}  Consequences of Substance Abuse: {BHH CONSEQUENCES OF SUBSTANCE ABUSE:22880}  Past Medical History:  Past Medical History:  Diagnosis Date  . Anemia   . Fibroids    Uterine  . GERD (gastroesophageal reflux disease)   . Hyperlipidemia 2008   Lipid profile in 04/2011:136, 53, 43  . Hypertension 2008   Normal CBC and CMet in 2012; negative stress nuclear in 2006- patient asymptomatic  . Insulin dependent diabetes mellitus ([place]) 1996  . Multiple allergies    perennial  . Normocytic normochromic anemia 12/27/2015  . Obesity   . PONV (postoperative nausea and vomiting)   . Sleep apnea    CPAP    Past Surgical History:  Procedure Laterality Date  . COLONOSCOPY N/A 04/05/2015   Procedure: COLONOSCOPY;  Surgeon: Rogene Houston, MD;  [facility];  Service: Endoscopy;   Laterality: N/A;  730  . DENTAL SURGERY    . ESOPHAGOGASTRODUODENOSCOPY N/A 01/21/2016   Procedure: ESOPHAGOGASTRODUODENOSCOPY (EGD);  Surgeon: Rogene Houston, MD;  [facility];  Service: Endoscopy;  Laterality: N/A;  11:15  . REFRACTIVE SURGERY  2011   Bilateral, two seperate occasions first in 2006  . REMOVAL OF IMPLANT    . RETINAL DETACHMENT SURGERY Bilateral 05/2005    Family Psychiatric History: ***  Family History:  Family History  Problem Relation Age of Onset  . Hypertension Mother   . Diabetes Mother   . Kidney failure Mother   . Stroke Mother   . Kidney failure Father   . Diabetes Father   . Heart attack Father   . COPD Sister   . Diabetes Brother   . Diabetes Brother   . Diabetes Brother   . Diabetes Brother   . Diabetes Brother   . Hypertension Brother     Social History:   Social History   Socioeconomic History  . Marital status: Married    Spouse name: Not on file  . Number of children: Not on file  . Years of education: Not on file  . Highest education level: Not on file  Social Needs  . Financial resource strain: Not on file  . Food insecurity - worry: Not on file  . Food insecurity - inability: Not on file  . Transportation needs - medical: Not on file  . Transportation needs - non-medical: Not on file  Occupational History  . Occupation: Employed    Employer: [employer]  Tobacco Use  . Smoking status: Never Smoker  . Smokeless tobacco: Never  Used  Substance and Sexual Activity  . Alcohol use: No  . Drug use: No  . Sexual activity: Yes    Birth control/protection: None, Implant  Other Topics Concern  . Not on file  Social History Narrative   Married with 2 children    Additional Social History: ***  Allergies:   Allergies  Allergen Reactions  . Nsaids     Kidney function   . Diclofenac Other (See Comments)    hallucination  . Oxycodone     Vomiting     Metabolic Disorder Labs: Lab Results  Component Value  Date   HGBA1C 8.2 (H) 12/04/2017   MPG 131 08/13/2017   MPG 140 04/12/2017   No results found for: PROLACTIN Lab Results  Component Value Date   CHOL 194 11/23/2017   TRIG 120 11/23/2017   HDL 50 11/23/2017   CHOLHDL 2.9 04/12/2017   VLDL 15 04/12/2017   LDLCALC 120 (H) 11/23/2017   LDLCALC 103 (H) 04/12/2017     Current Medications: Current Outpatient Medications  Medication Sig Dispense Refill  . ACCU-CHEK FASTCLIX LANCETS MISC Three times daily dx e11.65 150 each 5  . azelastine (ASTELIN) 0.1 % nasal spray Place 2 sprays into both nostrils 2 (two) times daily. Use in each nostril as directed 90 mL 1  . Blood Glucose Monitoring Suppl (FREESTYLE LITE) DEVI   0  . cyclobenzaprine (FLEXERIL) 10 MG tablet Take 1 tablet (10 mg total) by mouth 3 (three) times daily as needed for muscle spasms. 30 tablet 0  . diltiazem (TIAZAC) 360 MG 24 hr capsule Take 1 capsule (360 mg total) by mouth daily. 90 capsule 1  . Docusate Calcium (STOOL SOFTENER PO) Take by mouth daily.    Marland Kitchen estradiol (VIVELLE-DOT) 0.0375 MG/24HR Place 1 patch onto the skin 3 (three) times a week. Monday, Wednesday, and Saturday.  12  . ferrous sulfate 325 (65 FE) MG tablet Take 325 mg by mouth daily with breakfast.    . fluticasone (FLONASE) 50 MCG/ACT nasal spray Place 2 sprays into both nostrils daily. 48 g 1  . glucose blood (ACCU-CHEK GUIDE) test strip Three times daily testing dx e11.65 150 each 5  . glucose blood (FREESTYLE TEST STRIPS) test strip Three times daily testing dx e11.65 100 each 12  . insulin glargine (LANTUS) 100 unit/mL SOPN Inject 0.6 mLs (60 Units total) into the skin at bedtime. 15 pen 3  . Insulin Lispro Prot & Lispro (HUMALOG MIX 75/25 KWIKPEN) (75-25) 100 UNIT/ML Kwikpen Inject 12 units in the morning, 10 units at lunchtime, and 12 units at bedtime 45 mL 3  . Lancets (FREESTYLE) lancets Use as instructed three times daily dx e11.65 100 each 12  . liraglutide (VICTOZA) 18 MG/3ML SOPN Inject 0.3  mLs (1.8 mg total) into the skin daily. 90 mL 3  . loratadine (CLARITIN) 10 MG tablet Take 10 mg by mouth daily as needed for allergies.     Marland Kitchen losartan (COZAAR) 100 MG tablet Take 1 tablet (100 mg total) by mouth daily. 90 tablet 1  . NOVOLOG FLEXPEN 100 UNIT/ML FlexPen   98  . ondansetron (ZOFRAN) 4 MG tablet Take 1 tablet (4 mg total) by mouth 2 (two) times daily as needed for nausea or vomiting. 30 tablet 0  . pantoprazole (PROTONIX) 20 MG tablet Take 1 tablet (20 mg total) by mouth daily. 90 tablet 1  . potassium chloride (KLOR-CON M10) 10 MEQ tablet Take 1 tablet (10 mEq total) by mouth 3 (three)  times daily. 270 tablet 1  . pravastatin (PRAVACHOL) 40 MG tablet Take 1 tablet (40 mg total) by mouth daily. 90 tablet 1  . pregabalin (LYRICA) 50 MG capsule Take 50 mg 2 (two) times daily by mouth.    Marland Kitchen PREMARIN vaginal cream   10  . terconazole (TERAZOL 7) 0.4 % vaginal cream   0  . torsemide (DEMADEX) 20 MG tablet Take one tab by mouth (20mg ) every other day, alternating with two tabs (40mg ) every other day 135 tablet 3  . UNABLE TO FIND Diabetes shoes x 1 and inserts x 3 Dx:E11.29 1 each 0  . UNABLE TO FIND Compression hose (knee high) 15-25mm 1 pair Dx bilateral leg swelling 1 each 0  . UNIFINE PENTIPS 31G X 8 MM MISC USE AS DIRECTED 100 each 3  . vitamin B-12 (CYANOCOBALAMIN) 100 MCG tablet Take 100 mcg by mouth daily.     No current facility-administered medications for this visit.     Neurologic: Headache: No Seizure: No Paresthesias:No  Musculoskeletal: Strength & Muscle Tone: within normal limits Gait & Station: normal Patient leans: N/A  Psychiatric Specialty Exam: ROS  There were no vitals taken for this visit.There is no height or weight on file to calculate BMI.  General Appearance: Fairly Groomed  Eye Contact:  Good  Speech:  Clear and Coherent  Volume:  Normal  Mood:  {BHH MOOD:22306}  Affect:  {Affect (PAA):22687}  Thought Process:  Coherent and Goal Directed   Orientation:  Full (Time, Place, and Person)  Thought Content:  Logical  Suicidal Thoughts:  {ST/HT (PAA):22692}  Homicidal Thoughts:  {ST/HT (PAA):22692}  Memory:  Immediate;   Good Recent;   Good Remote;   Good  Judgement:  {Judgement (PAA):22694}  Insight:  {Insight (PAA):22695}  Psychomotor Activity:  Normal  Concentration:  Concentration: Good and Attention Span: Good  Recall:  Good  Fund of Knowledge:Good  Language: Good  Akathisia:  No  Handed:  Right  AIMS (if indicated):  N/A  Assets:  Communication Skills Desire for Improvement  ADL's:  Intact  Cognition: WNL  Sleep:  ***   Assessment  Plan  The patient demonstrates the following risk factors for suicide: Chronic risk factors for suicide include: {Chronic Risk Factors for UYZJQDU:43838184}. Acute risk factors for suicide include: {Acute Risk Factors for CRFVOHK:06770340}. Protective factors for this patient include: {Protective Factors for Suicide BTCY:81859093}. Considering these factors, the overall suicide risk at this point appears to be {Desc; low/moderate/high:110033}. Patient {ACTION; IS/IS JPE:16244695} appropriate for outpatient follow up.   Treatment Plan Summary: Plan as above   Norman Clay, MD 2/26/20191:52 PM

## 2018-01-10 ENCOUNTER — Ambulatory Visit (HOSPITAL_COMMUNITY): Payer: Commercial Managed Care - PPO | Admitting: Psychiatry

## 2018-01-12 ENCOUNTER — Encounter: Payer: Self-pay | Admitting: Family Medicine

## 2018-01-14 ENCOUNTER — Other Ambulatory Visit: Payer: Self-pay | Admitting: Family Medicine

## 2018-01-14 MED ORDER — AMLODIPINE BESYLATE 5 MG PO TABS: 5.0000 mg | ORAL_TABLET | Freq: Every day | ORAL | 3 refills | Status: DC

## 2018-01-15 ENCOUNTER — Encounter: Payer: Self-pay | Admitting: Family Medicine

## 2018-01-23 MED FILL — ESTRADIOL 0.0375 MG/24HR PT: 0.0375 | 28 days supply | Qty: 8 | Fill #1

## 2018-01-23 MED FILL — PROGESTERONE MICRONIZED 100: 100 | 30 days supply | Qty: 30 | Fill #1

## 2018-02-07 ENCOUNTER — Ambulatory Visit (HOSPITAL_COMMUNITY): Payer: Commercial Managed Care - PPO | Admitting: Psychiatry

## 2018-02-11 NOTE — Progress Notes (Deleted)
Psychiatric Initial Adult Assessment   Patient Identification: Kendra Hanson MRN:  [id number] Date of Evaluation:  02/11/2018 Referral Source: *** Chief Complaint:   Visit Diagnosis: No diagnosis found.  History of Present Illness:   Kendra Hanson is a 57 y.o. year old female with a history of adjustment disorder with anxiety, depression, hypertension, hyperlipidemia, who is referred for   Therapy appt?  Associated Signs/Symptoms: Depression Symptoms:  {DEPRESSION SYMPTOMS:20000} (Hypo) Manic Symptoms:  {BHH MANIC SYMPTOMS:22872} Anxiety Symptoms:  {BHH ANXIETY SYMPTOMS:22873} Psychotic Symptoms:  {BHH PSYCHOTIC SYMPTOMS:22874} PTSD Symptoms: {BHH PTSD SYMPTOMS:22875}  Past Psychiatric History:  Outpatient:  Psychiatry admission:  Previous suicide attempt:  Past trials of medication:  History of violence:   Previous Psychotropic Medications: {YES/NO:21197}  Substance Abuse History in the last 12 months:  {yes no:314532}  Consequences of Substance Abuse: {BHH CONSEQUENCES OF SUBSTANCE ABUSE:22880}  Past Medical History:  Past Medical History:  Diagnosis Date  . Anemia   . Fibroids    Uterine  . GERD (gastroesophageal reflux disease)   . Hyperlipidemia 2008   Lipid profile in 04/2011:136, 53, 43  . Hypertension 2008   Normal CBC and CMet in 2012; negative stress nuclear in 2006- patient asymptomatic  . Insulin dependent diabetes mellitus ([place]) 1996  . Multiple allergies    perennial  . Normocytic normochromic anemia 12/27/2015  . Obesity   . PONV (postoperative nausea and vomiting)   . Sleep apnea    CPAP    Past Surgical History:  Procedure Laterality Date  . COLONOSCOPY N/A 04/05/2015   Procedure: COLONOSCOPY;  Surgeon: Rogene Houston, MD;  [facility];  Service: Endoscopy;  Laterality: N/A;  730  . DENTAL SURGERY    . ESOPHAGOGASTRODUODENOSCOPY N/A 01/21/2016   Procedure: ESOPHAGOGASTRODUODENOSCOPY (EGD);  Surgeon: Rogene Houston, MD;   [facility];  Service: Endoscopy;  Laterality: N/A;  11:15  . REFRACTIVE SURGERY  2011   Bilateral, two seperate occasions first in 2006  . REMOVAL OF IMPLANT    . RETINAL DETACHMENT SURGERY Bilateral 05/2005    Family Psychiatric History: ***  Family History:  Family History  Problem Relation Age of Onset  . Hypertension Mother   . Diabetes Mother   . Kidney failure Mother   . Stroke Mother   . Kidney failure Father   . Diabetes Father   . Heart attack Father   . COPD Sister   . Diabetes Brother   . Diabetes Brother   . Diabetes Brother   . Diabetes Brother   . Diabetes Brother   . Hypertension Brother     Social History:   Social History   Socioeconomic History  . Marital status: Married    Spouse name: Not on file  . Number of children: Not on file  . Years of education: Not on file  . Highest education level: Not on file  Occupational History  . Occupation: Employed    Fish farm manager: [employer]  Social Needs  . Financial resource strain: Not on file  . Food insecurity:    Worry: Not on file    Inability: Not on file  . Transportation needs:    Medical: Not on file    Non-medical: Not on file  Tobacco Use  . Smoking status: Never Smoker  . Smokeless tobacco: Never Used  Substance and Sexual Activity  . Alcohol use: No  . Drug use: No  . Sexual activity: Yes    Birth control/protection: None, Implant  Lifestyle  .  Physical activity:    Days per week: Not on file    Minutes per session: Not on file  . Stress: Not on file  Relationships  . Social connections:    Talks on phone: Not on file    Gets together: Not on file    Attends religious service: Not on file    Active member of club or organization: Not on file    Attends meetings of clubs or organizations: Not on file    Relationship status: Not on file  Other Topics Concern  . Not on file  Social History Narrative   Married with 2 children    Additional Social History:  ***  Allergies:   Allergies  Allergen Reactions  . Nsaids     Kidney function   . Diclofenac Other (See Comments)    hallucination  . Oxycodone     Vomiting     Metabolic Disorder Labs: Lab Results  Component Value Date   HGBA1C 8.2 (H) 12/04/2017   MPG 131 08/13/2017   MPG 140 04/12/2017   No results found for: PROLACTIN Lab Results  Component Value Date   CHOL 194 11/23/2017   TRIG 120 11/23/2017   HDL 50 11/23/2017   CHOLHDL 2.9 04/12/2017   VLDL 15 04/12/2017   LDLCALC 120 (H) 11/23/2017   LDLCALC 103 (H) 04/12/2017     Current Medications: Current Outpatient Medications  Medication Sig Dispense Refill  . ACCU-CHEK FASTCLIX LANCETS MISC Three times daily dx e11.65 150 each 5  . amLODipine (NORVASC) 5 MG tablet Take 1 tablet (5 mg total) by mouth daily. 30 tablet 3  . azelastine (ASTELIN) 0.1 % nasal spray Place 2 sprays into both nostrils 2 (two) times daily. Use in each nostril as directed 90 mL 1  . Blood Glucose Monitoring Suppl (FREESTYLE LITE) DEVI   0  . cyclobenzaprine (FLEXERIL) 10 MG tablet Take 1 tablet (10 mg total) by mouth 3 (three) times daily as needed for muscle spasms. 30 tablet 0  . diltiazem (TIAZAC) 360 MG 24 hr capsule Take 1 capsule (360 mg total) by mouth daily. 90 capsule 1  . Docusate Calcium (STOOL SOFTENER PO) Take by mouth daily.    Marland Kitchen estradiol (VIVELLE-DOT) 0.0375 MG/24HR Place 1 patch onto the skin 3 (three) times a week. Monday, Wednesday, and Saturday.  12  . ferrous sulfate 325 (65 FE) MG tablet Take 325 mg by mouth daily with breakfast.    . fluticasone (FLONASE) 50 MCG/ACT nasal spray Place 2 sprays into both nostrils daily. 48 g 1  . glucose blood (ACCU-CHEK GUIDE) test strip Three times daily testing dx e11.65 150 each 5  . glucose blood (FREESTYLE TEST STRIPS) test strip Three times daily testing dx e11.65 100 each 12  . insulin glargine (LANTUS) 100 unit/mL SOPN Inject 0.6 mLs (60 Units total) into the skin at bedtime. 15 pen  3  . Insulin Lispro Prot & Lispro (HUMALOG MIX 75/25 KWIKPEN) (75-25) 100 UNIT/ML Kwikpen Inject 12 units in the morning, 10 units at lunchtime, and 12 units at bedtime 45 mL 3  . Lancets (FREESTYLE) lancets Use as instructed three times daily dx e11.65 100 each 12  . liraglutide (VICTOZA) 18 MG/3ML SOPN Inject 0.3 mLs (1.8 mg total) into the skin daily. 90 mL 3  . loratadine (CLARITIN) 10 MG tablet Take 10 mg by mouth daily as needed for allergies.     Marland Kitchen NOVOLOG FLEXPEN 100 UNIT/ML FlexPen   98  . ondansetron (ZOFRAN)  4 MG tablet Take 1 tablet (4 mg total) by mouth 2 (two) times daily as needed for nausea or vomiting. 30 tablet 0  . pantoprazole (PROTONIX) 20 MG tablet Take 1 tablet (20 mg total) by mouth daily. 90 tablet 1  . potassium chloride (KLOR-CON M10) 10 MEQ tablet Take 1 tablet (10 mEq total) by mouth 3 (three) times daily. 270 tablet 1  . pravastatin (PRAVACHOL) 40 MG tablet Take 1 tablet (40 mg total) by mouth daily. 90 tablet 1  . pregabalin (LYRICA) 50 MG capsule Take 50 mg 2 (two) times daily by mouth.    Marland Kitchen PREMARIN vaginal cream   10  . terconazole (TERAZOL 7) 0.4 % vaginal cream   0  . torsemide (DEMADEX) 20 MG tablet Take one tab by mouth (20mg ) every other day, alternating with two tabs (40mg ) every other day 135 tablet 3  . UNABLE TO FIND Diabetes shoes x 1 and inserts x 3 Dx:E11.29 1 each 0  . UNABLE TO FIND Compression hose (knee high) 15-90mm 1 pair Dx bilateral leg swelling 1 each 0  . UNIFINE PENTIPS 31G X 8 MM MISC USE AS DIRECTED 100 each 3  . vitamin B-12 (CYANOCOBALAMIN) 100 MCG tablet Take 100 mcg by mouth daily.     No current facility-administered medications for this visit.     Neurologic: Headache: No Seizure: No Paresthesias:No  Musculoskeletal: Strength & Muscle Tone: within normal limits Gait & Station: normal Patient leans: N/A  Psychiatric Specialty Exam: ROS  There were no vitals taken for this visit.There is no height or weight on file to  calculate BMI.  General Appearance: Fairly Groomed  Eye Contact:  Good  Speech:  Clear and Coherent  Volume:  Normal  Mood:  {BHH MOOD:22306}  Affect:  {Affect (PAA):22687}  Thought Process:  Coherent and Goal Directed  Orientation:  Full (Time, Place, and Person)  Thought Content:  Logical  Suicidal Thoughts:  {ST/HT (PAA):22692}  Homicidal Thoughts:  {ST/HT (PAA):22692}  Memory:  Immediate;   Good Recent;   Good Remote;   Good  Judgement:  {Judgement (PAA):22694}  Insight:  {Insight (PAA):22695}  Psychomotor Activity:  Normal  Concentration:  Concentration: Good and Attention Span: Good  Recall:  Good  Fund of Knowledge:Good  Language: Good  Akathisia:  No  Handed:  Right  AIMS (if indicated):  N/A  Assets:  Communication Skills Desire for Improvement  ADL's:  Intact  Cognition: WNL  Sleep:  ***   Assessment  Plan  The patient demonstrates the following risk factors for suicide: Chronic risk factors for suicide include: {Chronic Risk Factors for EHMCNOB:09628366}. Acute risk factors for suicide include: {Acute Risk Factors for QHUTMLY:65035465}. Protective factors for this patient include: {Protective Factors for Suicide KCLE:75170017}. Considering these factors, the overall suicide risk at this point appears to be {Desc; low/moderate/high:110033}. Patient {ACTION; IS/IS CBS:49675916} appropriate for outpatient follow up.   Treatment Plan Summary: Plan as above   Norman Clay, MD 4/1/201912:46 PM

## 2018-02-12 ENCOUNTER — Ambulatory Visit (HOSPITAL_COMMUNITY): Payer: Commercial Managed Care - PPO | Admitting: Psychiatry

## 2018-02-12 MED FILL — NORTRIPTYLINE HCL 25 MG CAP: 25 | 30 days supply | Qty: 30 | Fill #0

## 2018-02-12 MED FILL — TOPIRAMATE 25 MG TABLET: 25 | 30 days supply | Qty: 78 | Fill #0

## 2018-03-05 ENCOUNTER — Ambulatory Visit (INDEPENDENT_AMBULATORY_CARE_PROVIDER_SITE_OTHER): Payer: Commercial Managed Care - PPO | Admitting: Family Medicine

## 2018-03-05 ENCOUNTER — Encounter: Payer: Self-pay | Admitting: Family Medicine

## 2018-03-05 VITALS — BP 132/80 | HR 84 | Resp 16 | Ht 65.0 in | Wt 325.0 lb

## 2018-03-05 DIAGNOSIS — I10 Essential (primary) hypertension: Secondary | ICD-10-CM

## 2018-03-05 DIAGNOSIS — E1129 Type 2 diabetes mellitus with other diabetic kidney complication: Secondary | ICD-10-CM

## 2018-03-05 DIAGNOSIS — E785 Hyperlipidemia, unspecified: Secondary | ICD-10-CM

## 2018-03-05 DIAGNOSIS — G5600 Carpal tunnel syndrome, unspecified upper limb: Secondary | ICD-10-CM | POA: Diagnosis not present

## 2018-03-05 DIAGNOSIS — J302 Other seasonal allergic rhinitis: Secondary | ICD-10-CM | POA: Diagnosis not present

## 2018-03-05 MED ORDER — PROMETHAZINE-DM 6.25-15 MG/5ML PO SYRP: ORAL_SOLUTION | ORAL | 0 refills | Status: DC

## 2018-03-05 MED ORDER — AZELASTINE HCL 0.1 % NA SOLN: 2.0000 | Freq: Two times a day (BID) | NASAL | 12 refills | Status: DC

## 2018-03-05 NOTE — Progress Notes (Signed)
Kendra Hanson     MRN: [id number]      DOB: 10/30/1961   HPI Kendra Hanson is here for follow up and re-evaluation of chronic medical conditions, medication management and review of any available recent lab and radiology data.  Still c/o excessive right upper extremity and right hand pain, has seen a pain specialist, has topamax and imiparamine for pain , filled prescriptions but has not taken any medications , as she is afraid to , waiting to discuss with me. Pain in the  Hands keeps her up at night. Frustrated with weight , but states that hr blood sugar is doing well Denies polyuria, polydipsia, blurred vision , or hypoglycemic episodes. Less tearful and depressed, recently had her grandchildren visit and enjoyed the visit, no longe tearful and anxious. Has been followed by nephrology and cardiology since last visit here   ROS Denies recent fever or chills. C/o  sinus pressure and  nasal congestion  Denies  ear pain or sore throat. Denies chest congestion,c/o excessive cough especially at night when she lies down Denies chest pains, palpitations and leg swelling Denies abdominal pain, nausea, vomiting,diarrhea or constipation.   Denies dysuria, frequency, hesitancy or incontinence. . Denies uncontrolled depression, anxiety or insomnia. Denies skin break down or rash.   PE  BP 132/80   Pulse 84   Resp 16   Ht 5\' 5"  (1.651 m)   Wt (!) 325 lb (147.4 kg)   SpO2 96%   BMI 54.08 kg/m   Patient alert and oriented and in no cardiopulmonary distress.  HEENT: No facial asymmetry, EOMI,   oropharynx pink and moist.  Neck supple no JVD, no mass.Erythema and edema of nasal mucosa ,TM clear   Chest: Clear to auscultation bilaterally.  CVS: S1, S2 no murmurs, no S3.Regular rate.  ABD: Soft non tender.   Ext: No edema  MS: Adequate ROM spine, shoulders, hips and knees.  Skin: Intact, no ulcerations or rash noted.  Psych: Good eye contact, normal affect. Memory intact not  anxious or depressed appearing.  CNS: CN 2-12 intact, power,  normal throughout.no focal deficits noted.   Assessment & Plan  Essential hypertension Controlled, no change in medication DASH diet and commitment to daily physical activity for a minimum of 30 minutes discussed and encouraged, as a part of hypertension management. The importance of attaining a healthy weight is also discussed.  BP/Weight 03/05/2018 12/04/2017 11/28/2017 09/18/2017 08/29/2017 08/28/2017 2/87/6811  Systolic BP 572 620 355 974 163 845 364  Diastolic BP 80 64 68 64 82 68 80  Wt. (Lbs) 325 318 318.75 319 323 322 316.04  BMI 54.08 52.92 53.04 53.08 53.75 53.58 52.59       Type II diabetes mellitus with renal manifestations Kendra Hanson is reminded of the importance of commitment to daily physical activity for 30 minutes or more, as able and the need to limit carbohydrate intake to 30 to 60 grams per meal to help with blood sugar control.   The need to take medication as prescribed, test blood sugar as directed, and to call between visits if there is a concern that blood sugar is uncontrolled is also discussed.   Kendra Hanson is reminded of the importance of daily foot exam, annual eye examination, and good blood sugar, blood pressure and cholesterol control.  Diabetic Labs Latest Ref Rng & Units 03/06/2018 12/04/2017 11/23/2017 10/02/2017 09/11/2017  HbA1c 4.8 - 5.6 % - 8.2(H) - - -  Microalbumin Not Estab. ug/mL 4.7(H) - - - -  Micro/Creat Ratio 0.0 - 30.0 mg/g creat 8.0 - - - -  Chol 100 - 199 mg/dL - - 194 - -  HDL >39 mg/dL - - 50 - -  Calc LDL 0 - 99 mg/dL - - 120(H) - -  Triglycerides 0 - 149 mg/dL - - 120 - -  Creatinine 0.57 - 1.00 mg/dL - - 1.54(H) 1.72(H) 1.64(H)   BP/Weight 03/05/2018 12/04/2017 11/28/2017 09/18/2017 08/29/2017 08/28/2017 11/14/5850  Systolic BP 778 242 353 614 431 540 086  Diastolic BP 80 64 68 64 82 68 80  Wt. (Lbs) 325 318 318.75 319 323 322 316.04  BMI 54.08 52.92 53.04 53.08  53.75 53.58 52.59   Foot/eye exam completion dates 04/23/2017 07/05/2016  Foot Form Completion Done Done      Updated lab needed at/ before next visit.   Morbid obesity Deteriorated. Patient re-educated about  the importance of commitment to a  minimum of 150 minutes of exercise per week.  The importance of healthy food choices with portion control discussed. Encouraged to start a food diary, count calories and to consider  joining a support group. Sample diet sheets offered. Goals set by the patient for the next several months.   Weight /BMI 03/05/2018 12/04/2017 11/28/2017  WEIGHT 325 lb 318 lb 318 lb 12 oz  HEIGHT 5\' 5"  5\' 5"  5\' 5"   BMI 54.08 kg/m2 52.92 kg/m2 53.04 kg/m2      Carpal tunnel syndrome Uncontrolled disabling pain , pt to take initially topamax at bedtime , then add imipramine if needed in lower doses than originally prescribed  Seasonal allergies Uncontrolled with excessive nocturnal cough, start daily Astelin and add bedtime cough suppressant for as needed use, pt to stop cough drops

## 2018-03-05 NOTE — Patient Instructions (Addendum)
F/u in 3.5 months, call if you need me before  Fasting lipid, cmp and eGFR, hBA1C  Tomorrow  Microalb today from office  Add astelin added and also cough suppressant at bedtime  Please stop the cough drops  Eat PLANTS, pLANTS and MORE plant and ONLY DRINK wATER  Start topamax one at bedtime for 1 week, then 2 at bedtime for 2 weeks, if pain is controlled no need to add a 3rd drug, if not , I suggest start amitriptyline one at bedtime  Topamax is known to help with weight loss  It is important that you exercise regularly at least 30 minutes 5 times a week. If you develop chest pain, have severe difficulty breathing, or feel very tired, stop exercising immediately and seek medical attention

## 2018-03-06 ENCOUNTER — Other Ambulatory Visit (HOSPITAL_COMMUNITY)
Admission: RE | Admit: 2018-03-06 | Discharge: 2018-03-06 | Disposition: A | Discharge status: Home / Self Care | Payer: Commercial Managed Care - PPO | Source: Ambulatory Visit | Attending: Family Medicine | Admitting: Family Medicine

## 2018-03-06 DIAGNOSIS — E1129 Type 2 diabetes mellitus with other diabetic kidney complication: Secondary | ICD-10-CM | POA: Diagnosis present

## 2018-03-07 ENCOUNTER — Encounter: Payer: Self-pay | Admitting: Family Medicine

## 2018-03-07 LAB — MICROALBUMIN / CREATININE URINE RATIO
Creatinine, Urine: 59.1 mg/dL
Microalb Creat Ratio: 8 mg/g creat (ref 0.0–30.0)
Microalb, Ur: 4.7 ug/mL — ABNORMAL HIGH

## 2018-03-10 ENCOUNTER — Encounter: Payer: Self-pay | Admitting: Family Medicine

## 2018-03-10 NOTE — Assessment & Plan Note (Signed)
Uncontrolled with excessive nocturnal cough, start daily Astelin and add bedtime cough suppressant for as needed use, pt to stop cough drops

## 2018-03-10 NOTE — Assessment & Plan Note (Signed)
Uncontrolled disabling pain , pt to take initially topamax at bedtime , then add imipramine if needed in lower doses than originally prescribed

## 2018-03-10 NOTE — Assessment & Plan Note (Signed)
Ms. Kendra is reminded of the importance of commitment to daily physical activity for 30 minutes or more, as able and the need to limit carbohydrate intake to 30 to 60 grams per meal to help with blood sugar control.   The need to take medication as prescribed, test blood sugar as directed, and to call between visits if there is a concern that blood sugar is uncontrolled is also discussed.   [name] is reminded of the importance of daily foot exam, annual eye examination, and good blood sugar, blood pressure and cholesterol control.  Diabetic Labs Latest Ref Rng & Units 03/06/2018 12/04/2017 11/23/2017 10/02/2017 09/11/2017  HbA1c 4.8 - 5.6 % - 8.2(H) - - -  Microalbumin Not Estab. ug/mL 4.7(H) - - - -  Micro/Creat Ratio 0.0 - 30.0 mg/g creat 8.0 - - - -  Chol 100 - 199 mg/dL - - 194 - -  HDL >39 mg/dL - - 50 - -  Calc LDL 0 - 99 mg/dL - - 120(H) - -  Triglycerides 0 - 149 mg/dL - - 120 - -  Creatinine 0.57 - 1.00 mg/dL - - 1.54(H) 1.72(H) 1.64(H)   BP/Weight 03/05/2018 12/04/2017 11/28/2017 09/18/2017 08/29/2017 08/28/2017 6/65/9935  Systolic BP 701 779 390 300 923 300 762  Diastolic BP 80 64 68 64 82 68 80  Wt. (Lbs) 325 318 318.75 319 323 322 316.04  BMI 54.08 52.92 53.04 53.08 53.75 53.58 52.59   Foot/eye exam completion dates 04/23/2017 07/05/2016  Foot Form Completion Done Done      Updated lab needed at/ before next visit.

## 2018-03-10 NOTE — Assessment & Plan Note (Signed)
Deteriorated. Patient re-educated about  the importance of commitment to a  minimum of 150 minutes of exercise per week.  The importance of healthy food choices with portion control discussed. Encouraged to start a food diary, count calories and to consider  joining a support group. Sample diet sheets offered. Goals set by the patient for the next several months.   Weight /BMI 03/05/2018 12/04/2017 11/28/2017  WEIGHT 325 lb 318 lb 318 lb 12 oz  HEIGHT 5\' 5"  5\' 5"  5\' 5"   BMI 54.08 kg/m2 52.92 kg/m2 53.04 kg/m2

## 2018-03-10 NOTE — Assessment & Plan Note (Signed)
Controlled, no change in medication DASH diet and commitment to daily physical activity for a minimum of 30 minutes discussed and encouraged, as a part of hypertension management. The importance of attaining a healthy weight is also discussed.  BP/Weight 03/05/2018 12/04/2017 11/28/2017 09/18/2017 08/29/2017 08/28/2017 4/72/0721  Systolic BP 828 833 744 514 604 799 872  Diastolic BP 80 64 68 64 82 68 80  Wt. (Lbs) 325 318 318.75 319 323 322 316.04  BMI 54.08 52.92 53.04 53.08 53.75 53.58 52.59

## 2018-03-14 LAB — HEMOGLOBIN A1C
Est. average glucose Bld gHb Est-mCnc: 160 mg/dL
Hgb A1c MFr Bld: 7.2 % — ABNORMAL HIGH (ref 4.8–5.6)

## 2018-03-14 LAB — CMP14+EGFR
ALT: 8 IU/L (ref 0–32)
AST: 9 IU/L (ref 0–40)
Albumin/Globulin Ratio: 1.3 (ref 1.2–2.2)
Albumin: 4.1 g/dL (ref 3.5–5.5)
Alkaline Phosphatase: 128 IU/L — ABNORMAL HIGH (ref 39–117)
BUN/Creatinine Ratio: 13 (ref 9–23)
BUN: 20 mg/dL (ref 6–24)
Bilirubin Total: 0.4 mg/dL (ref 0.0–1.2)
CO2: 25 mmol/L (ref 20–29)
Calcium: 9.3 mg/dL (ref 8.7–10.2)
Chloride: 98 mmol/L (ref 96–106)
Creatinine, Ser: 1.54 mg/dL — ABNORMAL HIGH (ref 0.57–1.00)
GFR calc Af Amer: 43 mL/min/{1.73_m2} — ABNORMAL LOW (ref >59)
GFR calc non Af Amer: 37 mL/min/{1.73_m2} — ABNORMAL LOW (ref >59)
Globulin, Total: 3.1 g/dL (ref 1.5–4.5)
Glucose: 121 mg/dL — ABNORMAL HIGH (ref 65–99)
Potassium: 4.4 mmol/L (ref 3.5–5.2)
Sodium: 140 mmol/L (ref 134–144)
Total Protein: 7.2 g/dL (ref 6.0–8.5)

## 2018-03-14 LAB — LIPID PANEL
Chol/HDL Ratio: 3.7 ratio (ref 0.0–4.4)
Cholesterol, Total: 194 mg/dL (ref 100–199)
HDL: 53 mg/dL (ref >39)
LDL Calculated: 114 mg/dL — ABNORMAL HIGH (ref 0–99)
Triglycerides: 133 mg/dL (ref 0–149)
VLDL Cholesterol Cal: 27 mg/dL (ref 5–40)

## 2018-03-15 ENCOUNTER — Telehealth: Payer: Self-pay | Admitting: Family Medicine

## 2018-03-15 NOTE — Telephone Encounter (Signed)
Patient is requesting an rx for torsemide, states Dr.Befekadu used to prescribe it for her but he says Dr.Simpson needs to continue this.  (verify this with patient)

## 2018-03-17 ENCOUNTER — Encounter: Payer: Self-pay | Admitting: Family Medicine

## 2018-03-22 NOTE — Telephone Encounter (Signed)
This is now prescribed by cardiology and I sent a faxed message to them for refill

## 2018-03-25 ENCOUNTER — Other Ambulatory Visit: Payer: Self-pay

## 2018-03-25 MED ORDER — TORSEMIDE 20 MG PO TABS: ORAL_TABLET | ORAL | 3 refills | Status: DC

## 2018-03-25 MED FILL — ESTRADIOL 0.0375 MG/24HR PT: 0.0375 | 28 days supply | Qty: 8 | Fill #2

## 2018-03-25 MED FILL — PROGESTERONE 100 MG CAPSULE: 100 | 30 days supply | Qty: 30 | Fill #2

## 2018-03-25 NOTE — Telephone Encounter (Signed)
Refilled tor[facility]ide to [facility]

## 2018-04-03 ENCOUNTER — Ambulatory Visit (HOSPITAL_COMMUNITY): Payer: Commercial Managed Care - PPO | Admitting: Psychiatry

## 2018-04-18 MED FILL — PROGESTERONE 100 MG CAPSULE: 100 | 30 days supply | Qty: 30 | Fill #3

## 2018-04-18 MED FILL — ESTRADIOL 0.0375 MG/24HR PT: 0.0375 | 28 days supply | Qty: 8 | Fill #3

## 2018-05-22 MED FILL — ESTRADIOL 0.0375 MG/24HR PT: 0.0375 | 28 days supply | Qty: 8 | Fill #4

## 2018-05-22 MED FILL — PROGESTERONE 100 MG CAPSULE: 100 | 30 days supply | Qty: 30 | Fill #4

## 2018-05-24 ENCOUNTER — Other Ambulatory Visit: Payer: Self-pay | Admitting: Family Medicine

## 2018-06-12 MED FILL — ESTRADIOL 0.0375 MG/24HR PT: 0.0375 | 28 days supply | Qty: 8 | Fill #5 | Status: TO

## 2018-06-17 ENCOUNTER — Other Ambulatory Visit: Payer: Self-pay | Admitting: Family Medicine

## 2018-06-21 ENCOUNTER — Other Ambulatory Visit: Payer: Self-pay | Admitting: Family Medicine

## 2018-06-25 ENCOUNTER — Encounter: Payer: Self-pay | Admitting: Family Medicine

## 2018-06-25 ENCOUNTER — Ambulatory Visit (INDEPENDENT_AMBULATORY_CARE_PROVIDER_SITE_OTHER): Payer: Commercial Managed Care - PPO | Admitting: Family Medicine

## 2018-06-25 VITALS — BP 124/80 | HR 94 | Resp 16 | Ht 65.0 in | Wt 318.0 lb

## 2018-06-25 DIAGNOSIS — I10 Essential (primary) hypertension: Secondary | ICD-10-CM | POA: Diagnosis not present

## 2018-06-25 DIAGNOSIS — E785 Hyperlipidemia, unspecified: Secondary | ICD-10-CM | POA: Diagnosis not present

## 2018-06-25 DIAGNOSIS — Z794 Long term (current) use of insulin: Secondary | ICD-10-CM

## 2018-06-25 DIAGNOSIS — J302 Other seasonal allergic rhinitis: Secondary | ICD-10-CM

## 2018-06-25 DIAGNOSIS — E1121 Type 2 diabetes mellitus with diabetic nephropathy: Secondary | ICD-10-CM | POA: Diagnosis not present

## 2018-06-25 DIAGNOSIS — E559 Vitamin D deficiency, unspecified: Secondary | ICD-10-CM

## 2018-06-25 MED ORDER — MONTELUKAST SODIUM 10 MG PO TABS: 10.0000 mg | ORAL_TABLET | Freq: Every day | ORAL | 5 refills | Status: DC

## 2018-06-25 MED ORDER — ACCU-CHEK FASTCLIX LANCETS MISC: 5 refills | Status: DC

## 2018-06-25 MED ORDER — BENZONATATE 100 MG PO CAPS: 100.0000 mg | ORAL_CAPSULE | Freq: Two times a day (BID) | ORAL | 0 refills | Status: DC | PRN

## 2018-06-25 MED ORDER — FLUTICASONE PROPIONATE 50 MCG/ACT NA SUSP: 2.0000 | Freq: Every day | NASAL | 6 refills | Status: DC

## 2018-06-25 MED ORDER — GLUCOSE BLOOD VI STRP: ORAL_STRIP | 5 refills | Status: DC

## 2018-06-25 NOTE — Patient Instructions (Addendum)
F/U in 3 . 5 months , call if you need me before  HBA1C, chem7 and EGFR and vit D  Non Fasting tomorrow  Fasting lipid, cmp and EGFR, hBA1C, cBc and TSH 5 days before follow up in 3.5 months  Foot exam qualifies you for diabetic shoes and script to [place] up front per protocol  Tessalon perles, singulair, Astelin  STOP sodas and caffeine    It is important that you exercise regularly at least 30 minutes 5 times a week. If you develop chest pain, have severe difficulty breathing, or feel very tired, stop exercising immediately and seek medical attention  Thank you  for choosing [facility]. We consider it a privelige to serve you.  Delivering excellent health care in a caring and  compassionate way is our goal.  Partnering with you,  so that together we can achieve this goal is our strategy.

## 2018-06-26 ENCOUNTER — Other Ambulatory Visit: Payer: Self-pay | Admitting: Family Medicine

## 2018-06-26 MED FILL — PROGESTERONE 100 MG CAPSULE: 100 | 30 days supply | Qty: 30 | Fill #5 | Status: TO

## 2018-06-27 ENCOUNTER — Encounter: Payer: Self-pay | Admitting: Family Medicine

## 2018-06-27 ENCOUNTER — Other Ambulatory Visit: Payer: Self-pay | Admitting: Family Medicine

## 2018-06-27 LAB — BASIC METABOLIC PANEL
BUN/Creatinine Ratio: 14 (ref 9–23)
BUN: 21 mg/dL (ref 6–24)
CO2: 26 mmol/L (ref 20–29)
Calcium: 9.4 mg/dL (ref 8.7–10.2)
Chloride: 101 mmol/L (ref 96–106)
Creatinine, Ser: 1.49 mg/dL — ABNORMAL HIGH (ref 0.57–1.00)
GFR calc Af Amer: 44 mL/min/{1.73_m2} — ABNORMAL LOW (ref >59)
GFR calc non Af Amer: 38 mL/min/{1.73_m2} — ABNORMAL LOW (ref >59)
Glucose: 76 mg/dL (ref 65–99)
Potassium: 4.1 mmol/L (ref 3.5–5.2)
Sodium: 141 mmol/L (ref 134–144)

## 2018-06-27 LAB — HGB A1C W/O EAG: Hgb A1c MFr Bld: 7.1 % — ABNORMAL HIGH (ref 4.8–5.6)

## 2018-06-27 LAB — SPECIMEN STATUS REPORT

## 2018-06-27 LAB — VITAMIN D 25 HYDROXY (VIT D DEFICIENCY, FRACTURES): Vit D, 25-Hydroxy: 19.5 ng/mL — ABNORMAL LOW (ref 30.0–100.0)

## 2018-06-27 MED ORDER — ERGOCALCIFEROL 1.25 MG (50000 UT) PO CAPS: 50000.0000 [IU] | ORAL_CAPSULE | ORAL | 3 refills | Status: DC

## 2018-06-27 NOTE — Assessment & Plan Note (Signed)
uncorrected , start weekly vitamin d

## 2018-06-27 NOTE — Assessment & Plan Note (Signed)
Improved, encouraged to continue  Patient re-educated about  the importance of commitment to a  minimum of 150 minutes of exercise per week.  The importance of healthy food choices with portion control discussed. Encouraged to start a food diary, count calories and to consider  joining a support group. Sample diet sheets offered. Goals set by the patient for the next several months.   Weight /BMI 06/25/2018 03/05/2018 12/04/2017  WEIGHT 318 lb 325 lb 318 lb  HEIGHT 5\' 5"  5\' 5"  5\' 5"   BMI 52.92 kg/m2 54.08 kg/m2 52.92 kg/m2

## 2018-06-27 NOTE — Assessment & Plan Note (Signed)
Controlled, no change in medication DASH diet and commitment to daily physical activity for a minimum of 30 minutes discussed and encouraged, as a part of hypertension management. The importance of attaining a healthy weight is also discussed.  BP/Weight 06/25/2018 03/05/2018 12/04/2017 11/28/2017 09/18/2017 08/29/2017 88/64/8472  Systolic BP 072 182 883 374 451 460 479  Diastolic BP 80 80 64 68 64 82 68  Wt. (Lbs) 318 325 318 318.75 319 323 322  BMI 52.92 54.08 52.92 53.04 53.08 53.75 53.58

## 2018-06-27 NOTE — Assessment & Plan Note (Signed)
Uncontrolled with chronic non productive cough re educated re the need to commit to daily medication, Singulair and Astelin prescribed

## 2018-06-27 NOTE — Assessment & Plan Note (Signed)
Improving, no change in medication Kendra Hanson is reminded of the importance of commitment to daily physical activity for 30 minutes or more, as able and the need to limit carbohydrate intake to 30 to 60 grams per meal to help with blood sugar control.   The need to take medication as prescribed, test blood sugar as directed, and to call between visits if there is a concern that blood sugar is uncontrolled is also discussed.   Kendra Hanson is reminded of the importance of daily foot exam, annual eye examination, and good blood sugar, blood pressure and cholesterol control.  Diabetic Labs Latest Ref Rng & Units 06/26/2018 03/13/2018 03/06/2018 12/04/2017 11/23/2017  HbA1c 4.8 - 5.6 % 7.1(H) 7.2(H) - 8.2(H) -  Microalbumin Not Estab. ug/mL - - 4.7(H) - -  Micro/Creat Ratio 0.0 - 30.0 mg/g creat - - 8.0 - -  Chol 100 - 199 mg/dL - 194 - - 194  HDL >39 mg/dL - 53 - - 50  Calc LDL 0 - 99 mg/dL - 114(H) - - 120(H)  Triglycerides 0 - 149 mg/dL - 133 - - 120  Creatinine 0.57 - 1.00 mg/dL 1.49(H) 1.54(H) - - 1.54(H)   BP/Weight 06/25/2018 03/05/2018 12/04/2017 11/28/2017 09/18/2017 08/29/2017 04/59/9774  Systolic BP 142 395 320 233 435 686 168  Diastolic BP 80 80 64 68 64 82 68  Wt. (Lbs) 318 325 318 318.75 319 323 322  BMI 52.92 54.08 52.92 53.04 53.08 53.75 53.58   Foot/eye exam completion dates 06/25/2018 04/23/2017  Foot Form Completion Done Done

## 2018-06-27 NOTE — Progress Notes (Signed)
Kendra Hanson     MRN: [id number]      DOB: 07-08-1961   HPI Kendra Hanson is here for follow up and re-evaluation of chronic medical conditions, medication management and review of any available recent lab and radiology data.  Preventive health is updated, specifically  Cancer screening and Immunization.    The PT denies any adverse reactions to current medications since the last visit.  2 week h/o dry cough, denies fever or chills, meds are reviewed and none contribute Denies polyuria, polydipsia, blurred vision , or hypoglycemic episodes.    ROS Denies recent fever or chills. Denies sinus pressure, nasal congestion, ear pain or sore throat. Denies chest congestion, productive cough or wheezing. Denies chest pains, palpitations and leg swelling Denies abdominal pain, nausea, vomiting,diarrhea or constipation.   Denies dysuria, frequency, hesitancy or incontinence. Denies uncontrolled  joint pain, swelling and limitation in mobility. Denies headaches, seizures, numbness, or tingling. Denies depression, anxiety or insomnia. Denies skin break down or rash.   PE  BP 124/80   Pulse 94   Resp 16   Ht 5\' 5"  (1.651 m)   Wt (!) 318 lb (144.2 kg)   SpO2 94%   BMI 52.92 kg/m   Patient alert and oriented and in no cardiopulmonary distress.  HEENT: No facial asymmetry, EOMI,   oropharynx pink and moist.  Neck supple no JVD, no mass.  Chest: Clear to auscultation bilaterally.  CVS: S1, S2 no murmurs, no S3.Regular rate.  ABD: Soft non tender.   Ext: No edema  MS: Adequate ROM spine, shoulders, hips and knees.  Skin: Intact, no ulcerations or rash noted.  Psych: Good eye contact, normal affect. Memory intact not anxious or depressed appearing.  CNS: CN 2-12 intact, power,  normal throughout.no focal deficits noted.   Assessment & Plan  Essential hypertension Controlled, no change in medication DASH diet and commitment to daily physical activity for a minimum of 30  minutes discussed and encouraged, as a part of hypertension management. The importance of attaining a healthy weight is also discussed.  BP/Weight 06/25/2018 03/05/2018 12/04/2017 11/28/2017 09/18/2017 08/29/2017 71/69/6789  Systolic BP 381 017 510 258 527 782 423  Diastolic BP 80 80 64 68 64 82 68  Wt. (Lbs) 318 325 318 318.75 319 323 322  BMI 52.92 54.08 52.92 53.04 53.08 53.75 53.58       Type II diabetes mellitus with renal manifestations Improving, no change in medication Kendra Hanson is reminded of the importance of commitment to daily physical activity for 30 minutes or more, as able and the need to limit carbohydrate intake to 30 to 60 grams per meal to help with blood sugar control.   The need to take medication as prescribed, test blood sugar as directed, and to call between visits if there is a concern that blood sugar is uncontrolled is also discussed.   Kendra Hanson is reminded of the importance of daily foot exam, annual eye examination, and good blood sugar, blood pressure and cholesterol control.  Diabetic Labs Latest Ref Rng & Units 06/26/2018 03/13/2018 03/06/2018 12/04/2017 11/23/2017  HbA1c 4.8 - 5.6 % 7.1(H) 7.2(H) - 8.2(H) -  Microalbumin Not Estab. ug/mL - - 4.7(H) - -  Micro/Creat Ratio 0.0 - 30.0 mg/g creat - - 8.0 - -  Chol 100 - 199 mg/dL - 194 - - 194  HDL >39 mg/dL - 53 - - 50  Calc LDL 0 - 99 mg/dL - 114(H) - - 120(H)  Triglycerides 0 -  149 mg/dL - 133 - - 120  Creatinine 0.57 - 1.00 mg/dL 1.49(H) 1.54(H) - - 1.54(H)   BP/Weight 06/25/2018 03/05/2018 12/04/2017 11/28/2017 09/18/2017 08/29/2017 55/37/4827  Systolic BP 078 675 449 201 007 121 975  Diastolic BP 80 80 64 68 64 82 68  Wt. (Lbs) 318 325 318 318.75 319 323 322  BMI 52.92 54.08 52.92 53.04 53.08 53.75 53.58   Foot/eye exam completion dates 06/25/2018 04/23/2017  Foot Form Completion Done Done        Vitamin D deficiency uncorrected , start weekly vitamin d  Seasonal allergies Uncontrolled with  chronic non productive cough re educated re the need to commit to daily medication, Singulair and Astelin prescribed  Morbid obesity Improved, encouraged to continue  Patient re-educated about  the importance of commitment to a  minimum of 150 minutes of exercise per week.  The importance of healthy food choices with portion control discussed. Encouraged to start a food diary, count calories and to consider  joining a support group. Sample diet sheets offered. Goals set by the patient for the next several months.   Weight /BMI 06/25/2018 03/05/2018 12/04/2017  WEIGHT 318 lb 325 lb 318 lb  HEIGHT 5\' 5"  5\' 5"  5\' 5"   BMI 52.92 kg/m2 54.08 kg/m2 52.92 kg/m2

## 2018-06-27 NOTE — Progress Notes (Unsigned)
Vit d 

## 2018-06-28 ENCOUNTER — Telehealth: Payer: Self-pay | Admitting: Family Medicine

## 2018-06-28 ENCOUNTER — Other Ambulatory Visit: Payer: Self-pay

## 2018-06-28 MED ORDER — BLOOD GLUCOSE METER KIT: PACK | 0 refills | Status: DC

## 2018-06-28 MED ORDER — UNABLE TO FIND: 0 refills | Status: DC

## 2018-06-28 NOTE — Telephone Encounter (Signed)
Received [employer] paperwork by fax.

## 2018-06-28 NOTE — Telephone Encounter (Signed)
Generic voicemail left on the machine stating that patients accuchek needs prior auth  [phone]

## 2018-06-28 NOTE — Telephone Encounter (Signed)
Cannot PA for a certain brand of meter. Sent new rx for meter preferred by her insurance with supplies

## 2018-07-02 ENCOUNTER — Encounter: Payer: Self-pay | Admitting: Cardiovascular Disease

## 2018-07-02 ENCOUNTER — Ambulatory Visit (INDEPENDENT_AMBULATORY_CARE_PROVIDER_SITE_OTHER): Payer: Commercial Managed Care - PPO | Admitting: Cardiovascular Disease

## 2018-07-02 VITALS — BP 137/72 | HR 72 | Ht 66.0 in | Wt 317.0 lb

## 2018-07-02 DIAGNOSIS — I10 Essential (primary) hypertension: Secondary | ICD-10-CM | POA: Diagnosis not present

## 2018-07-02 DIAGNOSIS — R600 Localized edema: Secondary | ICD-10-CM

## 2018-07-02 DIAGNOSIS — R0609 Other forms of dyspnea: Secondary | ICD-10-CM | POA: Diagnosis not present

## 2018-07-02 DIAGNOSIS — I5032 Chronic diastolic (congestive) heart failure: Secondary | ICD-10-CM | POA: Diagnosis not present

## 2018-07-02 DIAGNOSIS — R0600 Dyspnea, unspecified: Secondary | ICD-10-CM

## 2018-07-02 DIAGNOSIS — R002 Palpitations: Secondary | ICD-10-CM

## 2018-07-02 NOTE — Progress Notes (Signed)
SUBJECTIVE: The patient presents for follow-up of progressive exertional dyspnea and bilateral leg edema.  Echocardiogram demonstrated normal left ventricular systolic function and regional wall motion, LVEF 55%, mild LVH, and grade 2 diastolic dysfunction with elevated filling pressures.  Nuclear stress test on 10/02/17 was normal.  She denies chest pain.  Shortness of breath has improved.  She is now taking 60 mg daily of torsemide as prescribed by her nephrologist.    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 Her husband is also my patient.  Review of Systems: As per "subjective", otherwise negative.  Allergies  Allergen Reactions  . Nsaids     Kidney function   . Diclofenac Other (See Comments)    hallucination  . Oxycodone     Vomiting     Current Outpatient Medications  Medication Sig Dispense Refill  . ACCU-CHEK FASTCLIX LANCETS MISC Three times daily dx e11.65 150 each 5  . amLODipine (NORVASC) 5 MG tablet TAKE 1 TABLET DAILY. 30 tablet 5  . azelastine (ASTELIN) 0.1 % nasal spray Place 2 sprays into both nostrils 2 (two) times daily. Use in each nostril as directed 30 mL 12  . benzonatate (TESSALON) 100 MG capsule Take 1 capsule (100 mg total) by mouth 2 (two) times daily as needed for cough. 30 capsule 0  . blood glucose meter kit and supplies Three times daily testing dx e11.65 (dispense formulary preferred meter and supplies). 1 each 0  . Blood Glucose Monitoring Suppl (FREESTYLE LITE) DEVI   0  . cetirizine (ZYRTEC) 10 MG tablet Take 10 mg by mouth daily.    . cyclobenzaprine (FLEXERIL) 10 MG tablet Take 1 tablet (10 mg total) by mouth 3 (three) times daily as needed for muscle spasms. 30 tablet 0  . diltiazem (TIAZAC) 360 MG 24 hr capsule TAKE 1 CAPSULE BY MOUTH ONCE DAILY. 90 capsule 0  . Docusate Calcium (STOOL SOFTENER PO) Take by mouth daily.    . ergocalciferol (VITAMIN D2) 50000 units capsule Take 1 capsule (50,000 Units total)  by mouth once a week. One capsule once weekly 12 capsule 3  . Estradiol (IMVEXXY STARTER PACK) 10 MCG INST Imvexxy Maintenance Pack 10 mcg vaginal insert  Insert 1 vaginal insert twice a week by vaginal route.    Marland Kitchen estradiol (VIVELLE-DOT) 0.0375 MG/24HR Place 1 patch onto the skin 3 (three) times a week. Monday, Wednesday, and Saturday.  12  . ferrous sulfate 325 (65 FE) MG tablet Take 325 mg by mouth daily with breakfast.    . fluticasone (FLONASE) 50 MCG/ACT nasal spray Place 2 sprays into both nostrils daily. 16 g 6  . glucose blood (ACCU-CHEK GUIDE) test strip Three times daily testing dx e11.65 150 each 5  . glucose blood (FREESTYLE TEST STRIPS) test strip Three times daily testing dx e11.65 100 each 12  . insulin glargine (LANTUS) 100 unit/mL SOPN Inject 0.6 mLs (60 Units total) into the skin at bedtime. 15 pen 3  . Insulin Lispro Prot & Lispro (HUMALOG MIX 75/25 KWIKPEN) (75-25) 100 UNIT/ML Kwikpen Inject 12 units in the morning, 10 units at lunchtime, and 12 units at bedtime 45 mL 3  . Lancets (FREESTYLE) lancets Use as instructed three times daily dx e11.65 100 each 12  . liraglutide (VICTOZA) 18 MG/3ML SOPN Inject 0.3 mLs (1.8 mg total) into the skin daily. 90 mL 3  . montelukast (SINGULAIR) 10 MG tablet Take 1 tablet (10 mg total) by mouth at bedtime.  30 tablet 5  . nortriptyline (PAMELOR) 25 MG capsule   0  . NOVOLOG FLEXPEN 100 UNIT/ML FlexPen   98  . ondansetron (ZOFRAN) 4 MG tablet Take 1 tablet (4 mg total) by mouth 2 (two) times daily as needed for nausea or vomiting. 30 tablet 0  . pantoprazole (PROTONIX) 20 MG tablet Take 1 tablet (20 mg total) by mouth daily. 90 tablet 1  . potassium chloride (KLOR-CON M10) 10 MEQ tablet Take 1 tablet (10 mEq total) by mouth 3 (three) times daily. 270 tablet 1  . pravastatin (PRAVACHOL) 40 MG tablet TAKE 1 TABLET ONCE DAILY. 90 tablet 2  . pregabalin (LYRICA) 50 MG capsule Take 50 mg 2 (two) times daily by mouth.    .  promethazine-dextromethorphan (PROMETHAZINE-DM) 6.25-15 MG/5ML syrup One teaspoon at bedtime as needed for cough 240 mL 0  . topiramate (TOPAMAX) 25 MG tablet   0  . torsemide (DEMADEX) 20 MG tablet Take one tab by mouth (66m) every other day, alternating with two tabs (452m every other day 135 tablet 3  . UNABLE TO FIND Diabetes shoes x 1 and inserts x 3 Dx:E11.29 1 each 0  . UNABLE TO FIND Compression hose (knee high) 15-2063m pair Dx bilateral leg swelling 1 each 0  . UNABLE TO FIND Diabetic shoes x 1 inserts x 3  Dx E11.9 1 each 0  . UNIFINE PENTIPS 31G X 8 MM MISC USE AS DIRECTED 100 each 3  . vitamin B-12 (CYANOCOBALAMIN) 100 MCG tablet Take 100 mcg by mouth daily.     No current facility-administered medications for this visit.     Past Medical History:  Diagnosis Date  . Anemia   . Fibroids    Uterine  . GERD (gastroesophageal reflux disease)   . Hyperlipidemia 2008   Lipid profile in 04/2011:136, 53, 43  . Hypertension 2008   Normal CBC and CMet in 2012; negative stress nuclear in 2006- patient asymptomatic  . Insulin dependent diabetes mellitus (HCCMarvell996  . Multiple allergies    perennial  . Normocytic normochromic anemia 12/27/2015  . Obesity   . PONV (postoperative nausea and vomiting)   . Sleep apnea    CPAP    Past Surgical History:  Procedure Laterality Date  . COLONOSCOPY N/A 04/05/2015   Procedure: COLONOSCOPY;  Surgeon: NajRogene HoustonD;  Location: [facility];  Service: Endoscopy;  Laterality: N/A;  730  . DENTAL SURGERY    . ESOPHAGOGASTRODUODENOSCOPY N/A 01/21/2016   Procedure: ESOPHAGOGASTRODUODENOSCOPY (EGD);  Surgeon: NajRogene HoustonD;  Location: [facility];  Service: Endoscopy;  Laterality: N/A;  11:15  . REFRACTIVE SURGERY  2011   Bilateral, two seperate occasions first in 2006  . REMOVAL OF IMPLANT    . RETINAL DETACHMENT SURGERY Bilateral 05/2005    Social History   Socioeconomic History  . Marital status: Married    Spouse  name: Not on file  . Number of children: Not on file  . Years of education: Not on file  . Highest education level: Not on file  Occupational History  . Occupation: Employed    [employer]  Social Needs  . Financial resource strain: Not on file  . Food insecurity:    Worry: Not on file    Inability: Not on file  . Transportation needs:    Medical: Not on file    Non-medical: Not on file  Tobacco Use  . Smoking status: Never Smoker  . Smokeless tobacco:  Never Used  Substance and Sexual Activity  . Alcohol use: No  . Drug use: No  . Sexual activity: Yes    Birth control/protection: None, Implant  Lifestyle  . Physical activity:    Days per week: Not on file    Minutes per session: Not on file  . Stress: Not on file  Relationships  . Social connections:    Talks on phone: Not on file    Gets together: Not on file    Attends religious service: Not on file    Active member of club or organization: Not on file    Attends meetings of clubs or organizations: Not on file    Relationship status: Not on file  . Intimate partner violence:    Fear of current or ex partner: Not on file    Emotionally abused: Not on file    Physically abused: Not on file    Forced sexual activity: Not on file  Other Topics Concern  . Not on file  Social History Narrative   Married with 2 children     Vitals:   07/02/18 1602  BP: 137/72  Pulse: 72  SpO2: 96%  Weight: (!) 317 lb (143.8 kg)  Height: _0  (1.676 m)    Wt Readings from Last 3 Encounters:  07/02/18 (!) 317 lb (143.8 kg)  06/25/18 (!) 318 lb (144.2 kg)  03/05/18 (!) 325 lb (147.4 kg)     PHYSICAL EXAM General: NAD HEENT: Normal. Neck: No JVD, no thyromegaly. Lungs: Clear to auscultation bilaterally with normal respiratory effort. CV: Regular rate and rhythm, normal S1/S2, no S3/S4, no murmur. Chronic trace to 1+ pitting bilateral lower extremity and peri-ankle edema edema.  Abdomen: Soft, nontender, no  distention.  Neurologic: Alert and oriented.  Psych: Normal affect. Skin: Normal. Musculoskeletal: No gross deformities.    ECG: Reviewed above under Subjective   Labs: Lab Results  Component Value Date/Time   K 4.1 06/26/2018 08:45 AM   BUN 21 06/26/2018 08:45 AM   CREATININE 1.49 (H) 06/26/2018 08:45 AM   CREATININE 1.72 (H) 10/02/2017 11:44 AM   ALT 8 03/13/2018 08:04 AM   TSH 1.61 09/11/2017 04:12 PM   HGB 10.9 (L) 08/29/2017 03:16 PM     Lipids: Lab Results  Component Value Date/Time   LDLCALC 114 (H) 03/13/2018 08:04 AM   CHOL 194 03/13/2018 08:04 AM   TRIG 133 03/13/2018 08:04 AM   HDL 53 03/13/2018 08:04 AM       ASSESSMENT AND PLAN:  1. Exertional dyspnea/bilateral leg edema/chronic diastolicheart failure: Symptomatically stable.  Weight is stable since her last visit with me.  She is on torsemide 80 mg daily as prescribed by her nephrologist.  I previously spoke to her at length about ways of improving diastolic function including exercise and weight loss.  I encouraged her to continue with water aerobics.  2. Hypertension:Controlled.  No changes to therapy.  3. Palpitations: Controlled on long-acting diltiazem. No changes.  4. Hypercholesterolemia: Continue pravastatin 40 mg.  LDL 114 on 03/13/2018 (lipid panel reviewed).   Disposition: Follow up 1 year.   Kate Sable, M.D., F.A.C.C.

## 2018-07-02 NOTE — Patient Instructions (Addendum)
Your physician wants you to follow-up in: 1 year with Dr.Koneswaran You will receive a reminder letter in the mail two months in advance. If you don't receive a letter, please call our office to schedule the follow-up appointment.      Your physician recommends that you continue on your current medications as directed. Please refer to the Current Medication list given to you today.     If you need a refill on your cardiac medications before your next appointment, please call your pharmacy.      No labs or tests today.      Thank you for choosing [facility] !         

## 2018-07-03 ENCOUNTER — Telehealth: Payer: Self-pay | Admitting: Family Medicine

## 2018-07-03 ENCOUNTER — Telehealth (HOSPITAL_COMMUNITY): Payer: Self-pay | Admitting: Family Medicine

## 2018-07-03 NOTE — Telephone Encounter (Signed)
Pt states she needs the office to give prior authorization for her diabetivc meter and test supplies, she uses [facility], please address this tomorrow as we discused She uses My chart" so she can be easily contacted if unable to get her on the phone, you have  Spoken with her this evening so it should not be a problem

## 2018-07-03 NOTE — Telephone Encounter (Signed)
07/03/18  Patient called and said that she had some papers that she would like for [place] to fill out for her.  I told her to just bring by and we would take a look at them to see what needed to be filled out.

## 2018-07-04 ENCOUNTER — Other Ambulatory Visit: Payer: Self-pay

## 2018-07-04 MED ORDER — BLOOD GLUCOSE METER KIT: PACK | 0 refills | Status: DC

## 2018-07-08 ENCOUNTER — Telehealth: Payer: Self-pay | Admitting: Family Medicine

## 2018-07-08 NOTE — Telephone Encounter (Signed)
FMLA received 06-27-18  Copied and sleeved

## 2018-07-09 NOTE — Telephone Encounter (Signed)
Spoke with insurance company and they gave name of covered glucose meter. Spoke with pharmacy and they stated that they have tried several and they each one they try isn't covered. It says non formulary. Re-sent script with accu check listed.

## 2018-07-16 NOTE — Telephone Encounter (Signed)
I called and spoke with the pt  Regarding paperwork she left to be completed for life insurance. She is aware of the need to collect and have a neurosurgeon complete

## 2018-08-02 ENCOUNTER — Ambulatory Visit (INDEPENDENT_AMBULATORY_CARE_PROVIDER_SITE_OTHER): Payer: Commercial Managed Care - PPO

## 2018-08-02 DIAGNOSIS — Z23 Encounter for immunization: Secondary | ICD-10-CM | POA: Diagnosis not present

## 2018-09-16 ENCOUNTER — Other Ambulatory Visit: Payer: Self-pay | Admitting: Family Medicine

## 2018-10-05 DIAGNOSIS — G5620 Lesion of ulnar nerve, unspecified upper limb: Secondary | ICD-10-CM | POA: Insufficient documentation

## 2018-10-21 ENCOUNTER — Telehealth: Payer: Self-pay | Admitting: Family Medicine

## 2018-10-21 NOTE — Telephone Encounter (Signed)
Please send current labs to [facility]

## 2018-10-23 ENCOUNTER — Ambulatory Visit: Payer: Self-pay | Admitting: Family Medicine

## 2018-10-24 ENCOUNTER — Telehealth: Payer: Self-pay

## 2018-10-24 DIAGNOSIS — Z794 Long term (current) use of insulin: Secondary | ICD-10-CM

## 2018-10-24 DIAGNOSIS — E785 Hyperlipidemia, unspecified: Secondary | ICD-10-CM

## 2018-10-24 DIAGNOSIS — E1121 Type 2 diabetes mellitus with diabetic nephropathy: Secondary | ICD-10-CM

## 2018-10-24 DIAGNOSIS — I10 Essential (primary) hypertension: Secondary | ICD-10-CM

## 2018-10-24 NOTE — Telephone Encounter (Signed)
Labs re-ordered

## 2018-10-28 ENCOUNTER — Other Ambulatory Visit: Payer: Self-pay | Admitting: Family Medicine

## 2018-10-28 ENCOUNTER — Encounter: Payer: Self-pay | Admitting: Family Medicine

## 2018-10-28 DIAGNOSIS — Z1231 Encounter for screening mammogram for malignant neoplasm of breast: Secondary | ICD-10-CM

## 2018-10-28 LAB — CMP14+EGFR
ALT: 7 IU/L (ref 0–32)
AST: 9 IU/L (ref 0–40)
Albumin/Globulin Ratio: 1.5 (ref 1.2–2.2)
Albumin: 4.2 g/dL (ref 3.5–5.5)
Alkaline Phosphatase: 117 IU/L (ref 39–117)
BUN/Creatinine Ratio: 13 (ref 9–23)
BUN: 18 mg/dL (ref 6–24)
Bilirubin Total: 0.3 mg/dL (ref 0.0–1.2)
CO2: 27 mmol/L (ref 20–29)
Calcium: 9 mg/dL (ref 8.7–10.2)
Chloride: 96 mmol/L (ref 96–106)
Creatinine, Ser: 1.4 mg/dL — ABNORMAL HIGH (ref 0.57–1.00)
GFR calc Af Amer: 48 mL/min/{1.73_m2} — ABNORMAL LOW (ref >59)
GFR calc non Af Amer: 41 mL/min/{1.73_m2} — ABNORMAL LOW (ref >59)
Globulin, Total: 2.8 g/dL (ref 1.5–4.5)
Glucose: 91 mg/dL (ref 65–99)
Potassium: 4.3 mmol/L (ref 3.5–5.2)
Sodium: 137 mmol/L (ref 134–144)
Total Protein: 7 g/dL (ref 6.0–8.5)

## 2018-10-28 LAB — CBC
Hematocrit: 35.6 % (ref 34.0–46.6)
Hemoglobin: 11.9 g/dL (ref 11.1–15.9)
MCH: 28.8 pg (ref 26.6–33.0)
MCHC: 33.4 g/dL (ref 31.5–35.7)
MCV: 86 fL (ref 79–97)
Platelets: 324 10*3/uL (ref 150–450)
RBC: 4.13 x10E6/uL (ref 3.77–5.28)
RDW: 13.9 % (ref 12.3–15.4)
WBC: 9.3 10*3/uL (ref 3.4–10.8)

## 2018-10-28 LAB — HEMOGLOBIN A1C
Est. average glucose Bld gHb Est-mCnc: 166 mg/dL
Hgb A1c MFr Bld: 7.4 % — ABNORMAL HIGH (ref 4.8–5.6)

## 2018-10-28 LAB — LIPID PANEL
Chol/HDL Ratio: 3.3 ratio (ref 0.0–4.4)
Cholesterol, Total: 175 mg/dL (ref 100–199)
HDL: 53 mg/dL (ref >39)
LDL Calculated: 105 mg/dL — ABNORMAL HIGH (ref 0–99)
Triglycerides: 87 mg/dL (ref 0–149)
VLDL Cholesterol Cal: 17 mg/dL (ref 5–40)

## 2018-10-28 LAB — TSH: TSH: 3.18 u[IU]/mL (ref 0.450–4.500)

## 2018-10-31 ENCOUNTER — Encounter: Payer: Self-pay | Admitting: Family Medicine

## 2018-10-31 ENCOUNTER — Ambulatory Visit (INDEPENDENT_AMBULATORY_CARE_PROVIDER_SITE_OTHER): Payer: Commercial Managed Care - PPO | Admitting: Family Medicine

## 2018-10-31 VITALS — BP 128/78 | HR 78 | Resp 15 | Ht 66.0 in | Wt 324.0 lb

## 2018-10-31 DIAGNOSIS — Z23 Encounter for immunization: Secondary | ICD-10-CM | POA: Diagnosis not present

## 2018-10-31 DIAGNOSIS — I10 Essential (primary) hypertension: Secondary | ICD-10-CM | POA: Diagnosis not present

## 2018-10-31 DIAGNOSIS — E1121 Type 2 diabetes mellitus with diabetic nephropathy: Secondary | ICD-10-CM

## 2018-10-31 DIAGNOSIS — Z794 Long term (current) use of insulin: Secondary | ICD-10-CM

## 2018-10-31 DIAGNOSIS — R600 Localized edema: Secondary | ICD-10-CM | POA: Diagnosis not present

## 2018-10-31 DIAGNOSIS — E785 Hyperlipidemia, unspecified: Secondary | ICD-10-CM

## 2018-10-31 NOTE — Assessment & Plan Note (Signed)
Adequately controlled, but ongoing weight gain a major concern. Refer to Endo for management of both Kendra Hanson is reminded of the importance of commitment to daily physical activity for 30 minutes or more, as able and the need to limit carbohydrate intake to 30 to 60 grams per meal to help with blood sugar control.   The need to take medication as prescribed, test blood sugar as directed, and to call between visits if there is a concern that blood sugar is uncontrolled is also discussed.   Kendra Hanson is reminded of the importance of daily foot exam, annual eye examination, and good blood sugar, blood pressure and cholesterol control.  Diabetic Labs Latest Ref Rng & Units 10/26/2018 06/26/2018 03/13/2018 03/06/2018 12/04/2017  HbA1c 4.8 - 5.6 % 7.4(H) 7.1(H) 7.2(H) - 8.2(H)  Microalbumin Not Estab. ug/mL - - - 4.7(H) -  Micro/Creat Ratio 0.0 - 30.0 mg/g creat - - - 8.0 -  Chol 100 - 199 mg/dL 175 - 194 - -  HDL >39 mg/dL 53 - 53 - -  Calc LDL 0 - 99 mg/dL 105(H) - 114(H) - -  Triglycerides 0 - 149 mg/dL 87 - 133 - -  Creatinine 0.57 - 1.00 mg/dL 1.40(H) 1.49(H) 1.54(H) - -   BP/Weight 10/31/2018 07/02/2018 06/25/2018 03/05/2018 12/04/2017 11/28/2017 19/03/931  Systolic BP 671 245 809 983 382 505 397  Diastolic BP 78 72 80 80 64 68 64  Wt. (Lbs) 324 317 318 325 318 318.75 319  BMI 52.29 51.17 52.92 54.08 52.92 53.04 53.08   Foot/eye exam completion dates 06/25/2018 04/23/2017  Foot Form Completion Done Done

## 2018-10-31 NOTE — Patient Instructions (Signed)
F/u in 4 months, call if you need me before, shingrix #2 at that visit  Shingrix #1 today  You are being referred to Dr Dorris Fetch for management of your diabetes and weight management  Prescription for compression knee high hose is sent to [place] [facility] to you for 2020, and I=continued improvem,ent in your health!  Thank you  for choosing [facility]. We consider it a privelige to serve you.  Delivering excellent health care in a caring and  compassionate way is our goal.  Partnering with you,  so that together we can achieve this goal is our strategy.

## 2018-10-31 NOTE — Assessment & Plan Note (Signed)
After obtaining informed consent, the vaccine is  administered 

## 2018-10-31 NOTE — Progress Notes (Signed)
Kendra Hanson     MRN: [id number]      DOB: Nov 21, 1960   HPI Kendra Hanson is here for follow up and re-evaluation of chronic medical conditions, medication management and review of any available recent lab and radiology data.  Preventive health is updated, specifically  Cancer screening and Immunization.   Questions or concerns regarding consultations or procedures which the PT has had in the interim are  addressed. The PT denies any adverse reactions to current medications since the last visit.  C/o bilateral hand pain and tingling, has seen hand specialist who cannot guarantee that surgery will correct this, she is holding off at this time . Mentally and emotionally improved significantly Concerned about ongoing weight gain, and requests new compression hose for chronic leg edema  ROS Denies recent fever or chills. Denies sinus pressure, nasal congestion, ear pain or sore throat. Denies chest congestion, productive cough or wheezing. Denies chest pains, palpitations , PND or orthopnea Denies abdominal pain, nausea, vomiting,diarrhea or constipation.   Denies dysuria, frequency, hesitancy or incontinence. Denies joint pain, swelling and limitation in mobility. Denies headaches, seizures, numbness, or tingling. Denies depression, anxiety or insomnia. Denies skin break down or rash.   PE  BP 128/78   Pulse 78   Resp 15   Ht 5\' 6"  (1.676 m)   Wt (!) 324 lb (147 kg)   SpO2 95%   BMI 52.29 kg/m   Patient alert and oriented and in no cardiopulmonary distress.  HEENT: No facial asymmetry, EOMI,   oropharynx pink and moist.  Neck supple no JVD, no mass.  Chest: Clear to auscultation bilaterally.  CVS: S1, S2 no murmurs, no S3.Regular rate.  ABD: Soft non tender.   Ext: one plus  edema  MS: Adequate though reduced  ROM spine, shoulders, hips and knees.  Skin: Intact, no ulcerations or rash noted.  Psych: Good eye contact, normal affect. Memory intact not anxious or  depressed appearing.  CNS: CN 2-12 intact, power,  normal throughout.no focal deficits noted.   Assessment & Plan  Essential hypertension Controlled, no change in medication DASH diet and commitment to daily physical activity for a minimum of 30 minutes discussed and encouraged, as a part of hypertension management. The importance of attaining a healthy weight is also discussed.  BP/Weight 10/31/2018 07/02/2018 06/25/2018 03/05/2018 12/04/2017 11/28/2017 06/19/5783  Systolic BP 696 295 284 132 440 102 725  Diastolic BP 78 72 80 80 64 68 64  Wt. (Lbs) 324 317 318 325 318 318.75 319  BMI 52.29 51.17 52.92 54.08 52.92 53.04 53.08       Morbid obesity Deteriorated. Patient re-educated about  the importance of commitment to a  minimum of 150 minutes of exercise per week.  The importance of healthy food choices with portion control discussed. Encouraged to start a food diary, count calories and to consider  joining a support group. Sample diet sheets offered. Goals set by the patient for the next several months.   Weight /BMI 10/31/2018 07/02/2018 06/25/2018  WEIGHT 324 lb 317 lb 318 lb  HEIGHT 5\' 6"  5\' 6"  5\' 5"   BMI 52.29 kg/m2 51.17 kg/m2 52.92 kg/m2      Type II diabetes mellitus with renal manifestations Adequately controlled, but ongoing weight gain a major concern. Refer to Endo for management of both [name] is reminded of the importance of commitment to daily physical activity for 30 minutes or more, as able and the need to limit carbohydrate intake to 30  to 60 grams per meal to help with blood sugar control.   The need to take medication as prescribed, test blood sugar as directed, and to call between visits if there is a concern that blood sugar is uncontrolled is also discussed.   [name] is reminded of the importance of daily foot exam, annual eye examination, and good blood sugar, blood pressure and cholesterol control.  Diabetic Labs Latest Ref Rng & Units  10/26/2018 06/26/2018 03/13/2018 03/06/2018 12/04/2017  HbA1c 4.8 - 5.6 % 7.4(H) 7.1(H) 7.2(H) - 8.2(H)  Microalbumin Not Estab. ug/mL - - - 4.7(H) -  Micro/Creat Ratio 0.0 - 30.0 mg/g creat - - - 8.0 -  Chol 100 - 199 mg/dL 175 - 194 - -  HDL >39 mg/dL 53 - 53 - -  Calc LDL 0 - 99 mg/dL 105(H) - 114(H) - -  Triglycerides 0 - 149 mg/dL 87 - 133 - -  Creatinine 0.57 - 1.00 mg/dL 1.40(H) 1.49(H) 1.54(H) - -   BP/Weight 10/31/2018 07/02/2018 06/25/2018 03/05/2018 12/04/2017 11/28/2017 03/15/9766  Systolic BP 341 937 902 409 735 329 924  Diastolic BP 78 72 80 80 64 68 64  Wt. (Lbs) 324 317 318 325 318 318.75 319  BMI 52.29 51.17 52.92 54.08 52.92 53.04 53.08   Foot/eye exam completion dates 06/25/2018 04/23/2017  Foot Form Completion Done Done         Hyperlipidemia LDL goal <100 Hyperlipidemia:Low fat diet discussed and encouraged.   Lipid Panel  Lab Results  Component Value Date   CHOL 175 10/26/2018   HDL 53 10/26/2018   LDLCALC 105 (H) 10/26/2018   TRIG 87 10/26/2018   CHOLHDL 3.3 10/26/2018   No med change, needs to lower fried and fatty food intake    Need for shingles vaccine After obtaining informed consent, the vaccine is  administered.   Lower extremity edema Benefits from and compression hose indicated, prescription sent to supplier

## 2018-10-31 NOTE — Assessment & Plan Note (Signed)
Controlled, no change in medication DASH diet and commitment to daily physical activity for a minimum of 30 minutes discussed and encouraged, as a part of hypertension management. The importance of attaining a healthy weight is also discussed.  BP/Weight 10/31/2018 07/02/2018 06/25/2018 03/05/2018 12/04/2017 11/28/2017 86/05/5448  Systolic BP 201 007 121 975 883 254 982  Diastolic BP 78 72 80 80 64 68 64  Wt. (Lbs) 324 317 318 325 318 318.75 319  BMI 52.29 51.17 52.92 54.08 52.92 53.04 53.08

## 2018-10-31 NOTE — Assessment & Plan Note (Signed)
Deteriorated. Patient re-educated about  the importance of commitment to a  minimum of 150 minutes of exercise per week.  The importance of healthy food choices with portion control discussed. Encouraged to start a food diary, count calories and to consider  joining a support group. Sample diet sheets offered. Goals set by the patient for the next several months.   Weight /BMI 10/31/2018 07/02/2018 06/25/2018  WEIGHT 324 lb 317 lb 318 lb  HEIGHT 5\' 6"  5\' 6"  5\' 5"   BMI 52.29 kg/m2 51.17 kg/m2 52.92 kg/m2

## 2018-10-31 NOTE — Assessment & Plan Note (Signed)
Benefits from and compression hose indicated, prescription sent to supplier

## 2018-10-31 NOTE — Assessment & Plan Note (Signed)
Hyperlipidemia:Low fat diet discussed and encouraged.   Lipid Panel  Lab Results  Component Value Date   CHOL 175 10/26/2018   HDL 53 10/26/2018   LDLCALC 105 (H) 10/26/2018   TRIG 87 10/26/2018   CHOLHDL 3.3 10/26/2018   No med change, needs to lower fried and fatty food intake

## 2018-11-03 IMAGING — MG 2D DIGITAL SCREENING BILATERAL MAMMOGRAM WITH 3D TOMO WITH CAD
8 of 11 series · 8 of 27 positions shown · non-contrast
Comparison: Previous exam(s).

CLINICAL DATA: Screening.

EXAM:
2D DIGITAL SCREENING BILATERAL MAMMOGRAM WITH 3D TOMO WITH CAD

[R MLO (1 of 3)]
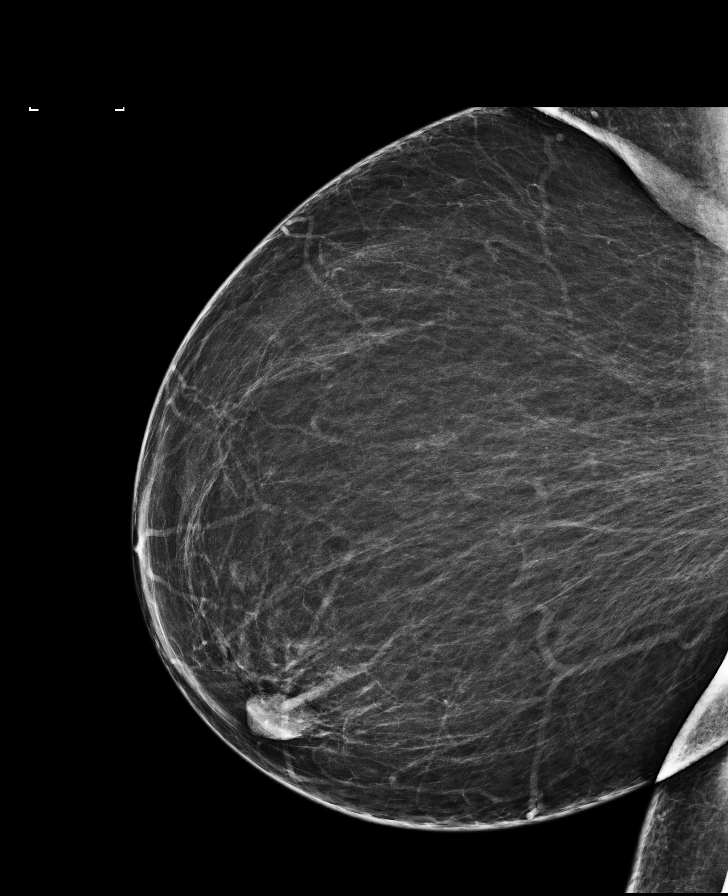

[R MLO (2 of 3)]
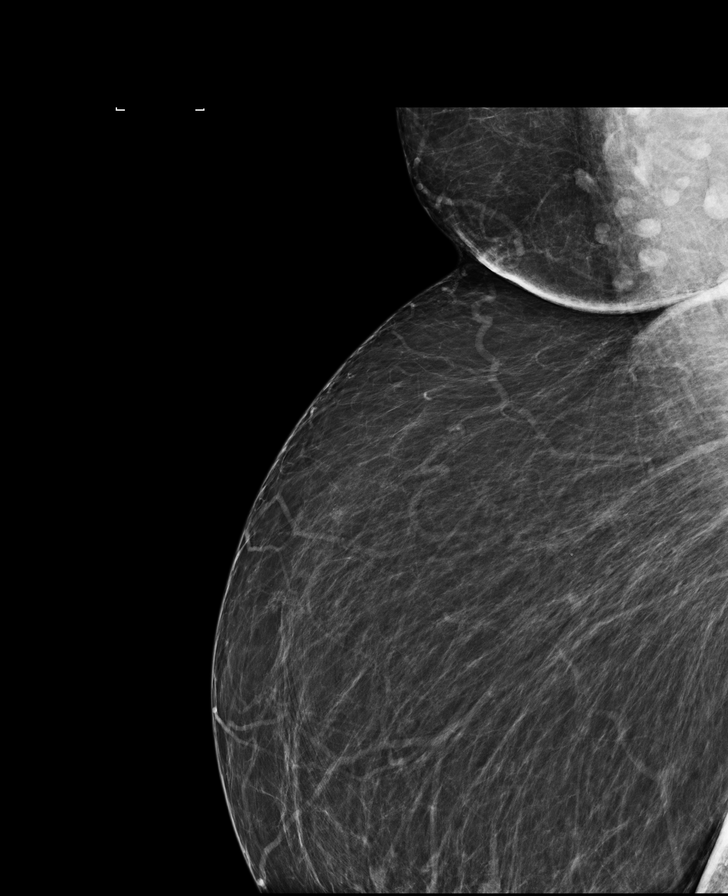

[L MLO (1 of 2)]
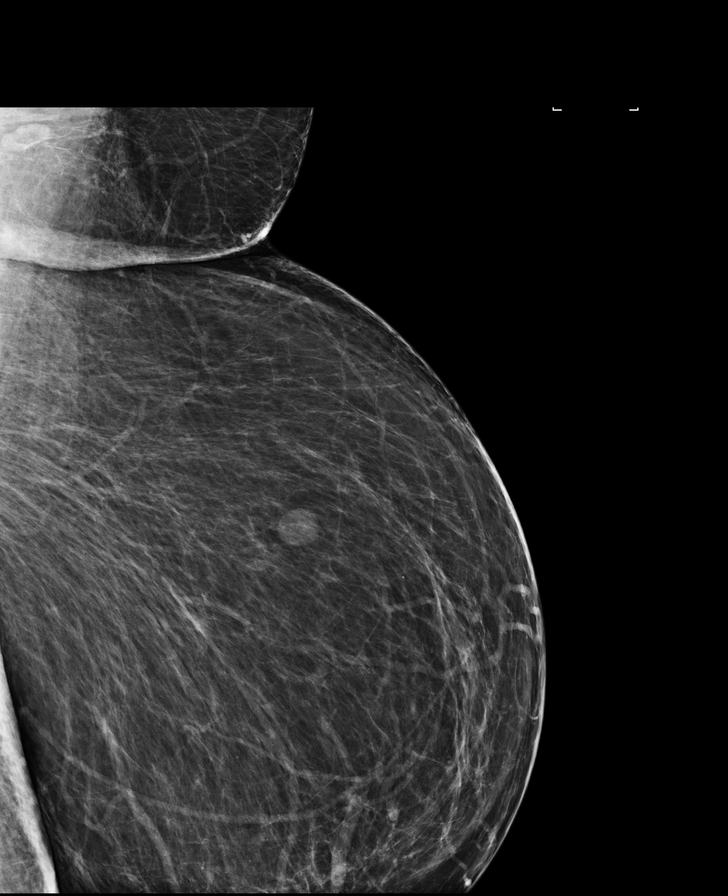

[L MLO (2 of 2)]
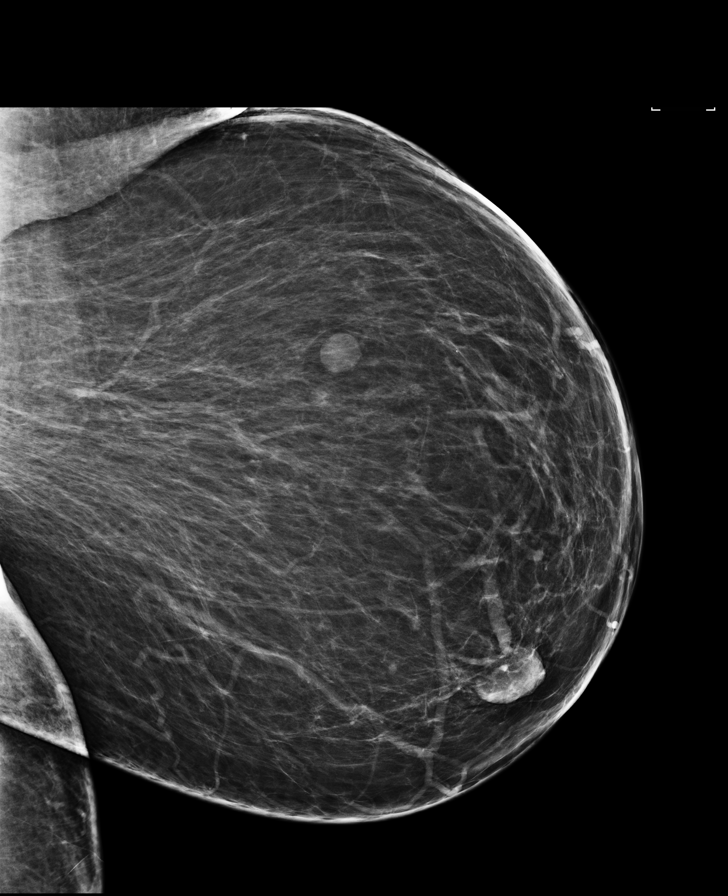

[R MLO (3 of 3)]
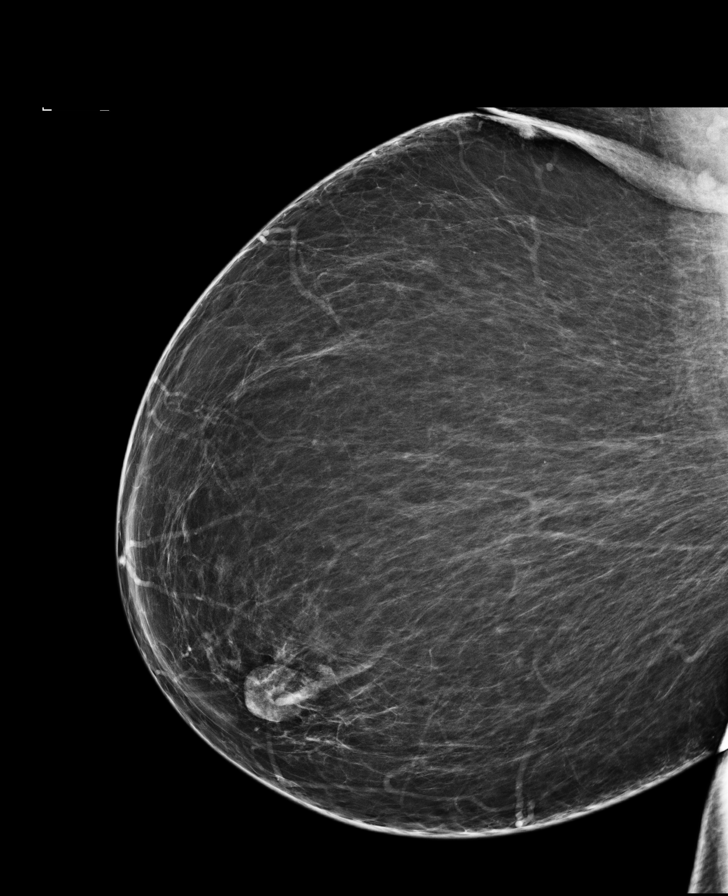

[R CC]
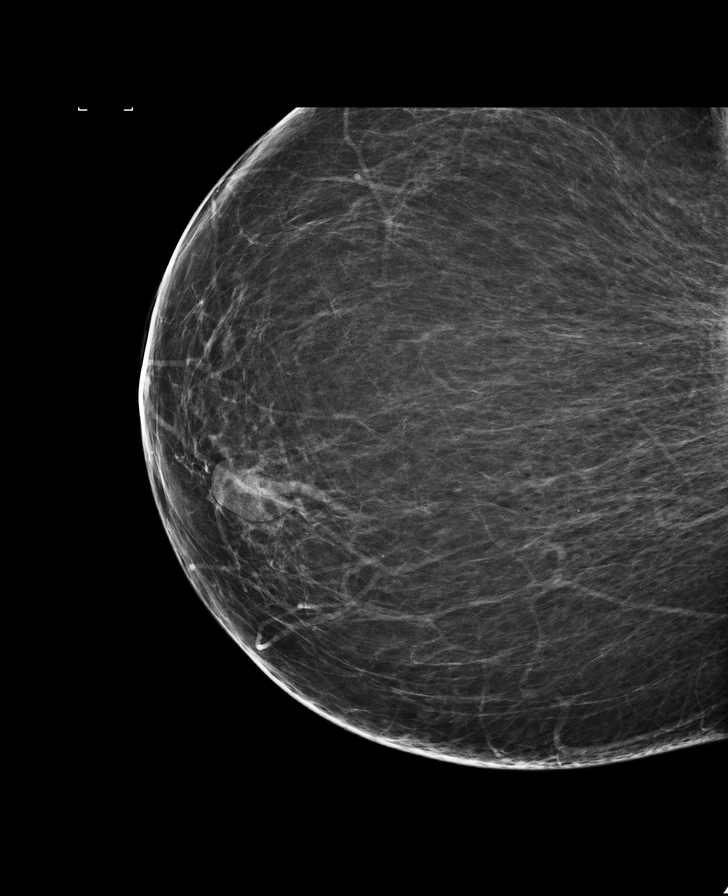

[L CC]
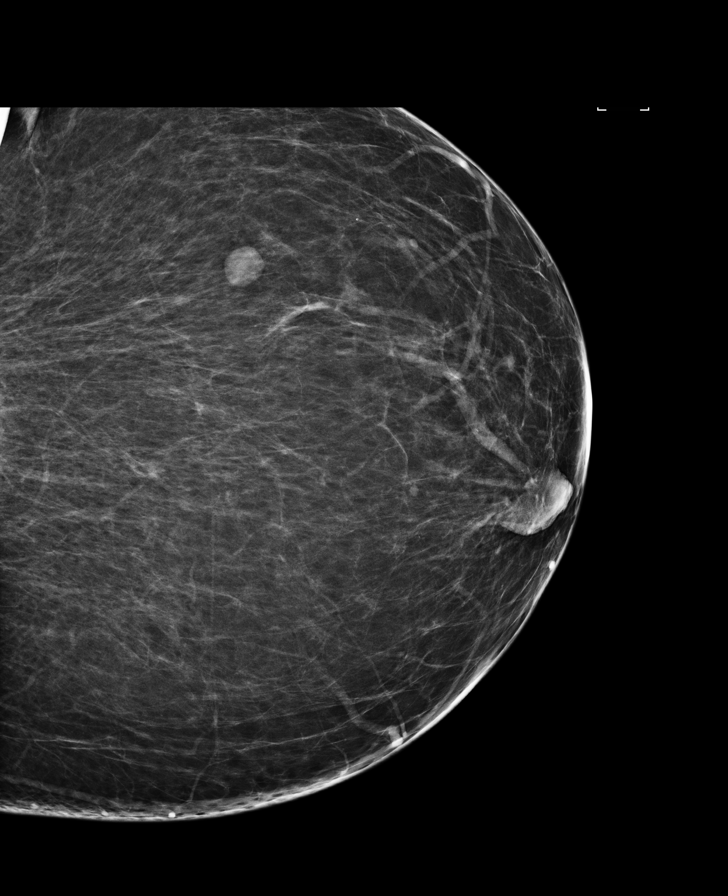

[L MLO tomo · tomo slice 45/89.0]
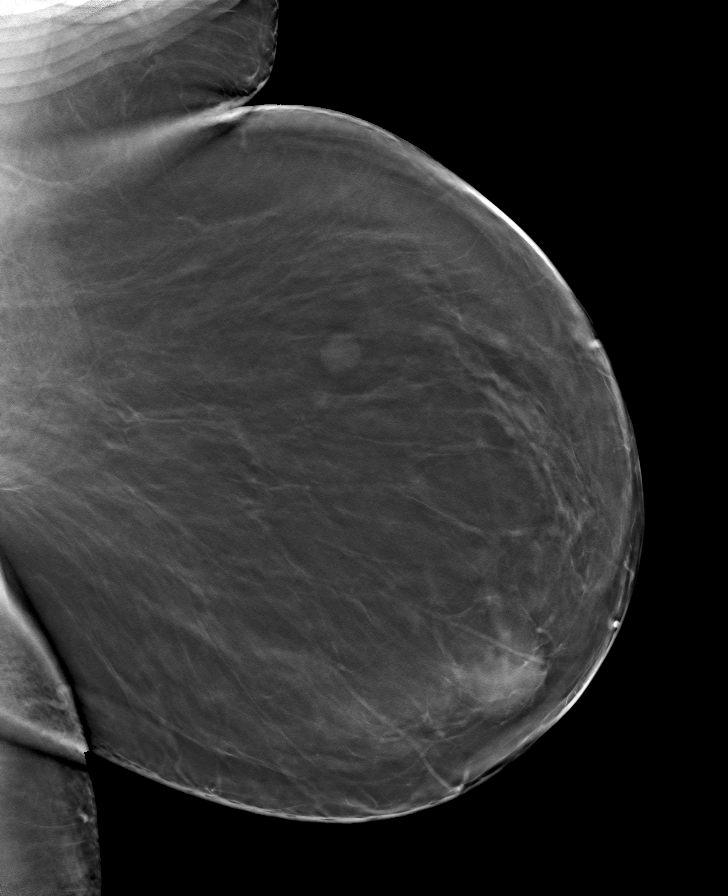

[8 of 27 positions shown; findings below may reference images not displayed]

ACR Breast Density Category b: There are scattered areas of
fibroglandular density.
FINDINGS: There are no findings suspicious for malignancy. Images were
processed with CAD.
IMPRESSION: No mammographic evidence of malignancy. A result letter of this
screening mammogram will be mailed directly to the patient.

RECOMMENDATION:
Screening mammogram in one year. (Code:[id number]-P-ZS0)

BI-RADS CATEGORY  1: Negative.

## 2018-11-23 ENCOUNTER — Other Ambulatory Visit: Payer: Self-pay | Admitting: Family Medicine

## 2018-12-03 ENCOUNTER — Other Ambulatory Visit: Payer: Self-pay | Admitting: Family Medicine

## 2018-12-12 ENCOUNTER — Other Ambulatory Visit: Payer: Self-pay | Admitting: Family Medicine

## 2018-12-12 ENCOUNTER — Ambulatory Visit (HOSPITAL_COMMUNITY): Payer: Self-pay

## 2018-12-16 ENCOUNTER — Ambulatory Visit (HOSPITAL_COMMUNITY)
Admission: RE | Admit: 2018-12-16 | Discharge: 2018-12-16 | Disposition: A | Discharge status: Home / Self Care | Payer: Commercial Managed Care - PPO | Source: Ambulatory Visit | Attending: Family Medicine | Admitting: Family Medicine

## 2018-12-16 ENCOUNTER — Other Ambulatory Visit: Payer: Self-pay | Admitting: Family Medicine

## 2018-12-16 ENCOUNTER — Other Ambulatory Visit (HOSPITAL_COMMUNITY): Payer: Self-pay | Admitting: Obstetrics and Gynecology

## 2018-12-16 ENCOUNTER — Ambulatory Visit (HOSPITAL_COMMUNITY): Payer: Self-pay

## 2018-12-16 DIAGNOSIS — Z1231 Encounter for screening mammogram for malignant neoplasm of breast: Secondary | ICD-10-CM

## 2018-12-26 ENCOUNTER — Encounter: Payer: Self-pay | Admitting: "Endocrinology

## 2018-12-26 ENCOUNTER — Ambulatory Visit (INDEPENDENT_AMBULATORY_CARE_PROVIDER_SITE_OTHER): Payer: Self-pay | Admitting: "Endocrinology

## 2018-12-26 VITALS — BP 110/70 | HR 82 | Ht 66.0 in | Wt 321.6 lb

## 2018-12-26 DIAGNOSIS — E1122 Type 2 diabetes mellitus with diabetic chronic kidney disease: Secondary | ICD-10-CM

## 2018-12-26 DIAGNOSIS — E1165 Type 2 diabetes mellitus with hyperglycemia: Secondary | ICD-10-CM | POA: Insufficient documentation

## 2018-12-26 DIAGNOSIS — IMO0002 Reserved for concepts with insufficient information to code with codable children: Secondary | ICD-10-CM

## 2018-12-26 DIAGNOSIS — I10 Essential (primary) hypertension: Secondary | ICD-10-CM

## 2018-12-26 DIAGNOSIS — E782 Mixed hyperlipidemia: Secondary | ICD-10-CM

## 2018-12-26 DIAGNOSIS — N183 Chronic kidney disease, stage 3 unspecified: Secondary | ICD-10-CM

## 2018-12-26 MED ORDER — INSULIN GLARGINE 100 UNIT/ML SOLOSTAR PEN: 50.0000 [IU] | PEN_INJECTOR | Freq: Every day | SUBCUTANEOUS | 0 refills | Status: DC

## 2018-12-26 NOTE — Progress Notes (Signed)
LeighAnn Brantley Wiley, CMA  

## 2018-12-26 NOTE — Patient Instructions (Signed)

## 2018-12-27 NOTE — Progress Notes (Signed)
Endocrinology Consult Note       12/27/2018, 3:58 PM   Subjective:    [name], female    DOB: Jul 03, 1961.  Kendra Hanson is being seen in consultation for management of currently uncontrolled symptomatic diabetes requested by  Fayrene Helper, MD.   Past Medical History:  Diagnosis Date  . Anemia   . Fibroids    Uterine  . GERD (gastroesophageal reflux disease)   . Hyperlipidemia 2008   Lipid profile in 04/2011:136, 53, 43  . Hypertension 2008   Normal CBC and CMet in 2012; negative stress nuclear in 2006- patient asymptomatic  . Insulin dependent diabetes mellitus ([place]) 1996  . Multiple allergies    perennial  . Normocytic normochromic anemia 12/27/2015  . Obesity   . PONV (postoperative nausea and vomiting)   . Sleep apnea    CPAP   Past Surgical History:  Procedure Laterality Date  . COLONOSCOPY N/A 04/05/2015   Procedure: COLONOSCOPY;  Surgeon: Rogene Houston, MD;  Location: [facility];  Service: Endoscopy;  Laterality: N/A;  730  . DENTAL SURGERY    . ESOPHAGOGASTRODUODENOSCOPY N/A 01/21/2016   Procedure: ESOPHAGOGASTRODUODENOSCOPY (EGD);  Surgeon: Rogene Houston, MD;  Location: [facility];  Service: Endoscopy;  Laterality: N/A;  11:15  . REFRACTIVE SURGERY  2011   Bilateral, two seperate occasions first in 2006  . REMOVAL OF IMPLANT    . RETINAL DETACHMENT SURGERY Bilateral 05/2005   Social History   Socioeconomic History  . Marital status: Married    Spouse name: Not on file  . Number of children: Not on file  . Years of education: Not on file  . Highest education level: Not on file  Occupational History  . Occupation: Employed    Fish farm manager: [employer]  Social Needs  . Financial resource strain: Not on file  . Food insecurity:    Worry: Not on file    Inability: Not on file  . Transportation needs:    Medical: Not on file    Non-medical: Not  on file  Tobacco Use  . Smoking status: Never Smoker  . Smokeless tobacco: Never Used  Substance and Sexual Activity  . Alcohol use: No  . Drug use: No  . Sexual activity: Yes    Birth control/protection: None, Implant  Lifestyle  . Physical activity:    Days per week: Not on file    Minutes per session: Not on file  . Stress: Not on file  Relationships  . Social connections:    Talks on phone: Not on file    Gets together: Not on file    Attends religious service: Not on file    Active member of club or organization: Not on file    Attends meetings of clubs or organizations: Not on file    Relationship status: Not on file  Other Topics Concern  . Not on file  Social History Narrative   Married with 2 children   Outpatient Encounter Medications as of 12/26/2018  Medication Sig  . amLODipine (NORVASC) 5 MG tablet TAKE 1 TABLET DAILY.  Marland Kitchen azelastine (  ASTELIN) 0.1 % nasal spray Place 2 sprays into both nostrils 2 (two) times daily. Use in each nostril as directed  . blood glucose meter kit and supplies Three times daily testing dx e11.65 (dispense formulary preferred Accuchek compact plus)  . Blood Glucose Monitoring Suppl (FREESTYLE LITE) DEVI   . cetirizine (ZYRTEC) 10 MG tablet Take 10 mg by mouth daily.  . cyclobenzaprine (FLEXERIL) 10 MG tablet Take 1 tablet (10 mg total) by mouth 3 (three) times daily as needed for muscle spasms.  Marland Kitchen diltiazem (TIAZAC) 360 MG 24 hr capsule TAKE 1 CAPSULE BY MOUTH ONCE DAILY.  Marland Kitchen Docusate Calcium (STOOL SOFTENER PO) Take by mouth daily.  . ergocalciferol (VITAMIN D2) 50000 units capsule Take 1 capsule (50,000 Units total) by mouth once a week. One capsule once weekly  . estradiol (VIVELLE-DOT) 0.0375 MG/24HR Place 1 patch onto the skin 3 (three) times a week. Monday, Wednesday, and Saturday.  . ferrous sulfate 325 (65 FE) MG tablet Take 325 mg by mouth daily with breakfast.  . fluticasone (FLONASE) 50 MCG/ACT nasal spray Place 2 sprays into both  nostrils daily.  Marland Kitchen glucose blood (FREESTYLE TEST STRIPS) test strip Three times daily testing dx e11.65  . Insulin Glargine (LANTUS SOLOSTAR) 100 UNIT/ML Solostar Pen Inject 50 Units into the skin at bedtime.  . Lancets (FREESTYLE) lancets Use as instructed three times daily dx e11.65  . montelukast (SINGULAIR) 10 MG tablet Take 1 tablet (10 mg total) by mouth at bedtime.  . ondansetron (ZOFRAN) 4 MG tablet Take 1 tablet (4 mg total) by mouth 2 (two) times daily as needed for nausea or vomiting.  . pantoprazole (PROTONIX) 20 MG tablet Take 1 tablet (20 mg total) by mouth daily.  . potassium chloride (K-DUR) 10 MEQ tablet TAKE 1 TABLET BY MOUTH 3 TIMES DAILY.  . pravastatin (PRAVACHOL) 40 MG tablet TAKE 1 TABLET ONCE DAILY.  . progesterone (PROMETRIUM) 100 MG capsule Take 100 mg by mouth daily.  Marland Kitchen topiramate (TOPAMAX) 25 MG tablet   . torsemide (DEMADEX) 20 MG tablet Take one tab by mouth ('20mg'$ ) every other day, alternating with two tabs ('40mg'$ ) every other day  . UNABLE TO FIND Diabetes shoes x 1 and inserts x 3 Dx:E11.29  . UNABLE TO FIND Compression hose (knee high) 15-59m 1 pair Dx bilateral leg swelling  . UNABLE TO FIND Diabetic shoes x 1 inserts x 3  Dx E11.9  . UNIFINE PENTIPS 31G X 8 MM MISC USE AS DIRECTED  . VICTOZA 18 MG/3ML SOPN INJECT 0.3MLS (1.'8MG'$  TOTAL) INTO THE SKIN DAILY.  . vitamin B-12 (CYANOCOBALAMIN) 100 MCG tablet Take 100 mcg by mouth daily.  . [DISCONTINUED] Insulin Lispro Prot & Lispro (HUMALOG MIX 75/25 KWIKPEN) (75-25) 100 UNIT/ML Kwikpen Inject 12 units in the morning, 10 units at lunchtime, and 12 units at bedtime  . [DISCONTINUED] LANTUS SOLOSTAR 100 UNIT/ML Solostar Pen INJECT 60 UNITS INTO THE SKIN EVERY NIGHT AT BEDTIME.  . [DISCONTINUED] NOVOLOG FLEXPEN 100 UNIT/ML FlexPen    No facility-administered encounter medications on file as of 12/26/2018.     ALLERGIES: Allergies  Allergen Reactions  . Nsaids     Kidney function   . Diclofenac Other (See  Comments)    hallucination  . Oxycodone     Vomiting     VACCINATION STATUS: Immunization History  Administered Date(s) Administered  . Influenza Split 08/14/2014  . Influenza,inj,Quad PF,6+ Mos 08/02/2018  . Pneumococcal Conjugate-13 02/01/2015  . Pneumococcal Polysaccharide-23 05/23/2005, 03/28/2016  . Tdap 10/08/2013  .  Zoster Recombinat (Shingrix) 10/31/2018    Diabetes  She presents for her initial diabetic visit. She has type 2 diabetes mellitus. Onset time: She was diagnosed at approximate age of 69 years. Her disease course has been worsening. Hypoglycemia symptoms include confusion, headaches and sweats. Pertinent negatives for hypoglycemia include no pallor or seizures. Associated symptoms include fatigue, polydipsia, polyphagia and polyuria. Pertinent negatives for diabetes include no chest pain. There are no hypoglycemic complications. Symptoms are worsening. Diabetic complications include nephropathy. Risk factors for coronary artery disease include diabetes mellitus, dyslipidemia, hypertension, obesity and sedentary lifestyle. Current diabetic treatment includes insulin injections. Her weight is increasing steadily. She is following a generally unhealthy diet. When asked about meal planning, she reported none. She has not had a previous visit with a dietitian. She never participates in exercise. Her home blood glucose trend is fluctuating minimally. (Her most recent A1c is 7.4%, returns with average blood glucose of 126 over the last 90 days.  She is dealing with some mild hypoglycemic episodes.) An ACE inhibitor/angiotensin II receptor blocker is not being taken. Eye exam is current.  Hyperlipidemia  This is a chronic problem. The problem is uncontrolled. Exacerbating diseases include diabetes and obesity. Pertinent negatives include no chest pain, myalgias or shortness of breath. Current antihyperlipidemic treatment includes statins. Risk factors for coronary artery disease  include dyslipidemia, diabetes mellitus, a sedentary lifestyle, post-menopausal and family history.      Review of Systems  Constitutional: Positive for fatigue. Negative for chills, fever and unexpected weight change.  HENT: Negative for trouble swallowing and voice change.   Eyes: Negative for visual disturbance.  Respiratory: Negative for cough, shortness of breath and wheezing.   Cardiovascular: Negative for chest pain, palpitations and leg swelling.  Gastrointestinal: Negative for diarrhea, nausea and vomiting.  Endocrine: Positive for polydipsia, polyphagia and polyuria. Negative for cold intolerance and heat intolerance.  Musculoskeletal: Negative for arthralgias and myalgias.  Skin: Negative for color change, pallor, rash and wound.  Neurological: Positive for headaches. Negative for seizures.  Psychiatric/Behavioral: Positive for confusion. Negative for suicidal ideas.    Objective:    BP 110/70   Pulse 82   Ht '5\' 6"'$  (1.676 m)   Wt (!) 321 lb 9.6 oz (145.9 kg)   SpO2 96%   BMI 51.91 kg/m   Wt Readings from Last 3 Encounters:  12/26/18 (!) 321 lb 9.6 oz (145.9 kg)  10/31/18 (!) 324 lb (147 kg)  07/02/18 (!) 317 lb (143.8 kg)     Physical Exam Constitutional:      Appearance: She is well-developed.  HENT:     Head: Normocephalic and atraumatic.  Neck:     Musculoskeletal: Normal range of motion and neck supple.     Thyroid: No thyromegaly.     Trachea: No tracheal deviation.  Cardiovascular:     Rate and Rhythm: Normal rate and regular rhythm.  Pulmonary:     Effort: Pulmonary effort is normal.     Breath sounds: Normal breath sounds.  Abdominal:     General: Bowel sounds are normal.     Palpations: Abdomen is soft.     Tenderness: There is no abdominal tenderness. There is no guarding.  Musculoskeletal: Normal range of motion.  Skin:    General: Skin is warm and dry.     Coloration: Skin is not pale.     Findings: No erythema or rash.  Neurological:      Mental Status: She is alert and oriented to person, place, and  time.     Cranial Nerves: No cranial nerve deficit.     Coordination: Coordination normal.     Deep Tendon Reflexes: Reflexes are normal and symmetric.  Psychiatric:        Judgment: Judgment normal.       CMP ( most recent) CMP     Component Value Date/Time   NA 137 10/26/2018 0911   K 4.3 10/26/2018 0911   CL 96 10/26/2018 0911   CO2 27 10/26/2018 0911   GLUCOSE 91 10/26/2018 0911   GLUCOSE 190 (H) 10/02/2017 1144   BUN 18 10/26/2018 0911   CREATININE 1.40 (H) 10/26/2018 0911   CREATININE 1.72 (H) 10/02/2017 1144   CALCIUM 9.0 10/26/2018 0911   PROT 7.0 10/26/2018 0911   ALBUMIN 4.2 10/26/2018 0911   AST 9 10/26/2018 0911   ALT 7 10/26/2018 0911   ALKPHOS 117 10/26/2018 0911   BILITOT 0.3 10/26/2018 0911   GFRNONAA 41 (L) 10/26/2018 0911   GFRNONAA 34 (L) 09/11/2017 1612   GFRAA 48 (L) 10/26/2018 0911   GFRAA 40 (L) 09/11/2017 1612     Diabetic Labs (most recent): Lab Results  Component Value Date   HGBA1C 7.4 (H) 10/26/2018   HGBA1C 7.1 (H) 06/26/2018   HGBA1C 7.2 (H) 03/13/2018     Lipid Panel ( most recent) Lipid Panel     Component Value Date/Time   CHOL 175 10/26/2018 0911   TRIG 87 10/26/2018 0911   HDL 53 10/26/2018 0911   CHOLHDL 3.3 10/26/2018 0911   CHOLHDL 2.9 04/12/2017 1121   VLDL 15 04/12/2017 1121   LDLCALC 105 (H) 10/26/2018 0911      Lab Results  Component Value Date   TSH 3.180 10/26/2018   TSH 1.61 09/11/2017   TSH 2.10 08/29/2017   TSH 2.35 07/01/2016   TSH 2.926 08/06/2015   TSH 2.015 05/30/2014   TSH 2.826 01/24/2013   TSH 3.197 06/03/2012   TSH 1.773 04/18/2011   TSH 2.614 10/28/2007      Assessment & Plan:   1. Uncontrolled type 2 diabetes mellitus with stage 3 chronic kidney disease ([place])  - Kendra Hanson has currently uncontrolled symptomatic type 2 DM since 58 years of age,  with most recent A1c of 7.4 %. Recent labs reviewed. - I had a long  discussion with her about the progressive nature of diabetes and the pathology behind its complications. -her diabetes is complicated by stage 3 renal insufficiency and she remains at a high risk for more acute and chronic complications which include CAD, CVA, CKD, retinopathy, and neuropathy. These are all discussed in detail with her.  - I have counseled her on diet management and weight loss, by adopting a carbohydrate restricted/protein rich diet. - she admits that there is a room for improvement in her food and drink choices. - Suggestion is made for her to avoid simple carbohydrates  from her diet including Cakes, Sweet Desserts, Ice Cream, Soda (diet and regular), Sweet Tea, Candies, Chips, Cookies, Store Bought Juices, Alcohol in Excess of  1-2 drinks a day, Artificial Sweeteners,  Coffee Creamer, and "Sugar-free" Products. This will help patient to have more stable blood glucose profile and potentially avoid unintended weight gain.  - I encouraged her to switch to  unprocessed or minimally processed complex starch and increased protein intake (animal or plant source), fruits, and vegetables.  - she is advised to stick to a routine mealtimes to eat 3 meals  a day and avoid unnecessary snacks (  to snack only to correct hypoglycemia).   - she will be scheduled with Jearld Fenton, RDN, CDE for individualized diabetes education.  - I have approached her with the following individualized plan to manage diabetes and patient agrees:   -Based on her presentation with tightly controlled glycemia and random hypoglycemic episodes, she will be considered for simplified treatment regimen.  she will be continued only on her basal insulin Lantus at 50 units  daily at bedtime , and advised to hold her Humalog for now.   -She will monitor blood glucose 2 times a day-daily before breakfast and at bedtime.    - she is encouraged to call clinic for blood glucose levels less than 70 or above 300 mg /dl. -  she is advised to continue Victoza 1.8 mg subcutaneously daily, therapeutically suitable for patient .  - she is not a candidate for metformin, SGLT2 inhibitors due to concurrent renal insufficiency.  - Patient specific target  A1c;  LDL, HDL, Triglycerides, and  Waist Circumference were discussed in detail.  2) Blood Pressure /Hypertension:  her blood pressure is  controlled to target.   she is advised to continue her current medications including diltiazem 360 mg p.o. daily with breakfast . 3) Lipids/Hyperlipidemia:   Review of her recent lipid panel showed  uncontrolled  LDL at 105 .  she  is advised to continue    pravastatin 40 mg daily at bedtime.  Side effects and precautions discussed with her.  4)  Weight/Diet:  Body mass index is 51.91 kg/m.  -  clearly complicating her diabetes care.  I discussed with her the fact that loss of 5 - 10% of her  current body weight will have the most impact on her diabetes management.  CDE Consult will be initiated . Exercise, and detailed carbohydrates information provided  -  detailed on discharge instructions.  5) Chronic Care/Health Maintenance:  -she  Is on Statin medications and  is encouraged to initiate and continue to follow up with Ophthalmology, Dentist,  Podiatrist at least yearly or according to recommendations, and advised to stay away from smoking. I have recommended yearly flu vaccine and pneumonia vaccine at least every 5 years; moderate intensity exercise for up to 150 minutes weekly; and  sleep for at least 7 hours a day.  - she is  advised to maintain close follow up with Fayrene Helper, MD for primary care needs, as well as her other providers for optimal and coordinated care.  - Time spent with the patient: 35 minutes, of which >50% was spent in obtaining information about her symptoms, reviewing her previous labs/studies, evaluations, and treatments, counseling her about her currently uncontrolled symptomatic type 2 diabetes  complicated by stage renal insufficiency, hyperlipidemia, hypertension, and developing plans for long term treatment based on the latest standards of care/guidelines.    Netta Cedars participated in the discussions, expressed understanding, and voiced agreement with the above plans.  All questions were answered to her satisfaction. she is encouraged to contact clinic should she have any questions or concerns prior to her return visit.  Follow up plan: - Return in about 9 weeks (around 02/27/2019) for Follow up with Pre-visit Labs, Meter, and Logs.  Glade Lloyd, MD [facility] [address] [place], [place] [address] Phone: [phone]  Fax: [phone]    12/27/2018, 3:58 PM  This note was partially dictated with voice recognition software. Similar sounding words can be transcribed inadequately or may not  be corrected upon review.

## 2019-01-13 ENCOUNTER — Telehealth: Payer: Self-pay | Admitting: *Deleted

## 2019-01-13 NOTE — Telephone Encounter (Signed)
Long term disability claim received Noted Copied Sleeved

## 2019-01-22 ENCOUNTER — Encounter: Payer: Self-pay | Admitting: Family Medicine

## 2019-01-22 ENCOUNTER — Other Ambulatory Visit: Payer: Self-pay

## 2019-01-22 ENCOUNTER — Telehealth: Payer: Self-pay | Admitting: *Deleted

## 2019-01-22 ENCOUNTER — Ambulatory Visit (INDEPENDENT_AMBULATORY_CARE_PROVIDER_SITE_OTHER): Payer: Commercial Managed Care - PPO | Admitting: Family Medicine

## 2019-01-22 VITALS — BP 122/86 | HR 93 | Resp 14 | Ht 65.0 in | Wt 325.1 lb

## 2019-01-22 DIAGNOSIS — G5600 Carpal tunnel syndrome, unspecified upper limb: Secondary | ICD-10-CM

## 2019-01-22 DIAGNOSIS — E1121 Type 2 diabetes mellitus with diabetic nephropathy: Secondary | ICD-10-CM

## 2019-01-22 DIAGNOSIS — N183 Chronic kidney disease, stage 3 unspecified: Secondary | ICD-10-CM

## 2019-01-22 DIAGNOSIS — E1165 Type 2 diabetes mellitus with hyperglycemia: Secondary | ICD-10-CM | POA: Diagnosis not present

## 2019-01-22 DIAGNOSIS — E782 Mixed hyperlipidemia: Secondary | ICD-10-CM

## 2019-01-22 DIAGNOSIS — Z560 Unemployment, unspecified: Secondary | ICD-10-CM

## 2019-01-22 DIAGNOSIS — R200 Anesthesia of skin: Secondary | ICD-10-CM

## 2019-01-22 DIAGNOSIS — M170 Bilateral primary osteoarthritis of knee: Secondary | ICD-10-CM

## 2019-01-22 DIAGNOSIS — Z794 Long term (current) use of insulin: Secondary | ICD-10-CM

## 2019-01-22 DIAGNOSIS — I110 Hypertensive heart disease with heart failure: Secondary | ICD-10-CM | POA: Diagnosis not present

## 2019-01-22 NOTE — Progress Notes (Signed)
MADISSON KULAGA     MRN: [id number]      DOB: [birth year]   HPI Ms. Hinde is here to discuss and address disabilty paperwork , has forms from  1 source [place].  She became permanently disabled,in 10/2017 and the only one time since then  a form was completed for her was by cardiology. Disabled because of bilateral hand weakness,   Left elbow debility and , bilateral knee pain, also CKD, hypertension heart disease ,heart failure morbid obesity and   ROS Denies recent fever or chills. Denies sinus pressure, nasal congestion, ear pain or sore throat. Denies chest congestion, productive cough or wheezing. Denies chest pains, palpitations and leg swelling Denies abdominal pain, nausea, vomiting,diarrhea or constipation.   Denies dysuria, frequency, hesitancy or incontinence. Denies headaches, seizures, numbness, or tingling. Denies depression, anxiety or insomnia. Denies skin break down or rash.   PE  BP 122/86   Pulse 93   Resp 14   Ht 5\' 5"  (1.651 m)   Wt (!) 325 lb 1.3 oz (147.5 kg)   SpO2 96% Comment: room air  BMI 54.10 kg/m   Patient alert and oriented and in no cardiopulmonary distress.  HEENT: No facial asymmetry, EOMI,   oropharynx pink and moist.  Neck supple no JVD, no mass.  Chest: Clear to auscultation bilaterally.  CVS: S1, S2 no murmurs, no S3.Regular rate.  ABD: Soft non tender.   Ext: Trace edema  MS: decreased  ROM spine, shoulders, hips and markedly reduced in  Knees.Decreased ROM with tenderness of left elbow  Skin: Intact, no ulcerations or rash noted.  Psych: Good eye contact, normal affect. Memory intact not anxious or depressed appearing.  CNS: CN 2-12 intact, power,  normal throughout.no focal deficits noted.   Assessment & Plan Hypertensive heart disease with heart failure (HCC) Currently stable with  controlled blood pressure and no symptoms or signs of heart failure Followed by Cardiology Disabled because of heart disease and heart  failure permanenetly  Uncontrolled type 2 diabetes mellitus with hyperglycemia ([place]) Managed by endo Ms. Schmutz is reminded of the importance of commitment to daily physical activity for 30 minutes or more, as able and the need to limit carbohydrate intake to 30 to 60 grams per meal to help with blood sugar control.   The need to take medication as prescribed, test blood sugar as directed, and to call between visits if there is a concern that blood sugar is uncontrolled is also discussed.   Ms. Netherland is reminded of the importance of daily foot exam, annual eye examination, and good blood sugar, blood pressure and cholesterol control.  Diabetic Labs Latest Ref Rng & Units 10/26/2018 06/26/2018 03/13/2018 03/06/2018 12/04/2017  HbA1c 4.8 - 5.6 % 7.4(H) 7.1(H) 7.2(H) - 8.2(H)  Microalbumin Not Estab. ug/mL - - - 4.7(H) -  Micro/Creat Ratio 0.0 - 30.0 mg/g creat - - - 8.0 -  Chol 100 - 199 mg/dL 175 - 194 - -  HDL >39 mg/dL 53 - 53 - -  Calc LDL 0 - 99 mg/dL 105(H) - 114(H) - -  Triglycerides 0 - 149 mg/dL 87 - 133 - -  Creatinine 0.57 - 1.00 mg/dL 1.40(H) 1.49(H) 1.54(H) - -   BP/Weight 01/22/2019 12/26/2018 10/31/2018 07/02/2018 06/25/2018 03/05/2018 1/49/7026  Systolic BP 378 588 502 774 128 786 767  Diastolic BP 86 70 78 72 80 80 64  Wt. (Lbs) 325.08 321.6 324 317 318 325 318  BMI 54.1 51.91 52.29 51.17 52.92 54.08  52.92   Foot/eye exam completion dates 06/25/2018 04/23/2017  Foot Form Completion Done Done        Type II diabetes mellitus with renal manifestations Managed by Endo and controlled Ms. Gallaher is reminded of the importance of commitment to daily physical activity for 30 minutes or more, as able and the need to limit carbohydrate intake to 30 to 60 grams per meal to help with blood sugar control.   The need to take medication as prescribed, test blood sugar as directed, and to call between visits if there is a concern that blood sugar is uncontrolled is also discussed.    Ms. Fritzsche is reminded of the importance of daily foot exam, annual eye examination, and good blood sugar, blood pressure and cholesterol control.  Diabetic Labs Latest Ref Rng & Units 10/26/2018 06/26/2018 03/13/2018 03/06/2018 12/04/2017  HbA1c 4.8 - 5.6 % 7.4(H) 7.1(H) 7.2(H) - 8.2(H)  Microalbumin Not Estab. ug/mL - - - 4.7(H) -  Micro/Creat Ratio 0.0 - 30.0 mg/g creat - - - 8.0 -  Chol 100 - 199 mg/dL 175 - 194 - -  HDL >39 mg/dL 53 - 53 - -  Calc LDL 0 - 99 mg/dL 105(H) - 114(H) - -  Triglycerides 0 - 149 mg/dL 87 - 133 - -  Creatinine 0.57 - 1.00 mg/dL 1.40(H) 1.49(H) 1.54(H) - -   BP/Weight 01/22/2019 12/26/2018 10/31/2018 07/02/2018 06/25/2018 03/05/2018 6/94/8546  Systolic BP 270 350 093 818 299 371 696  Diastolic BP 86 70 78 72 80 80 64  Wt. (Lbs) 325.08 321.6 324 317 318 325 318  BMI 54.1 51.91 52.29 51.17 52.92 54.08 52.92   Foot/eye exam completion dates 06/25/2018 04/23/2017  Foot Form Completion Done Done        Morbid obesity Deteriorated.multiple associated co morbidities contributing to her permanent disability  Patient re-educated about  the importance of commitment to a  minimum of 150 minutes of exercise per week as able.  The importance of healthy food choices with portion control discussed, as well as eating regularly and within a 12 hour window most days. The need to choose "clean , green" food 50 to 75% of the time is discussed, as well as to make water the primary drink and set a goal of 64 ounces water daily.  Encouraged to start a food diary,  and to consider  joining a support group. Sample diet sheets offered. Goals set by the patient for the next several months.   Weight /BMI 01/22/2019 12/26/2018 10/31/2018  WEIGHT 325 lb 1.3 oz 321 lb 9.6 oz 324 lb  HEIGHT 5\' 5"  5\' 6"  5\' 6"   BMI 54.1 kg/m2 51.91 kg/m2 52.29 kg/m2      Carpal tunnel syndrome Significant weakness  And pain in both hands persisting, disabled because of this pericemently   Osteoarthritis of both knees Marked limitation in mobility and uncontrolled pain and stiffness, disabled because of severe osteoarthritis of knees permanenetly  Bilateral leg numbness Poor and unsteady gait, limiting safe mobility , contributing to permanent disability  CKD (chronic kidney disease) stage 3, GFR 30-59 ml/min (HCC) Chronic kidney disease contributing to permanent disability, stage 3  Not currently working due to disabled status Ms Brash was  made permanently disabled since 10/2017 , due to multiple uncontrolled co morbidities leading to  heart and kidney disease, and disabling osteoarthritis, limiting mobility and use of upper extremities and hands

## 2019-01-22 NOTE — Patient Instructions (Signed)
Please change April follow up to early June , call if you need me before  Fasting lipid panel 1 week before next visit.  Forms will be completed by the end of today, we will let you know, you can collect a copy when called and we will fax the papers in

## 2019-01-22 NOTE — Telephone Encounter (Signed)
Cancer Claim Form Received via walk in  Copied  Noted Sleeved

## 2019-01-22 NOTE — Telephone Encounter (Signed)
Physicians Statement Form received via walk in  Copied  Noted Sleeved

## 2019-01-24 ENCOUNTER — Other Ambulatory Visit: Payer: Self-pay | Admitting: Family Medicine

## 2019-01-29 ENCOUNTER — Encounter: Payer: Self-pay | Admitting: Family Medicine

## 2019-01-29 NOTE — Assessment & Plan Note (Addendum)
Deteriorated.multiple associated co morbidities contributing to her permanent disability  Patient re-educated about  the importance of commitment to a  minimum of 150 minutes of exercise per week as able.  The importance of healthy food choices with portion control discussed, as well as eating regularly and within a 12 hour window most days. The need to choose "clean , green" food 50 to 75% of the time is discussed, as well as to make water the primary drink and set a goal of 64 ounces water daily.  Encouraged to start a food diary,  and to consider  joining a support group. Sample diet sheets offered. Goals set by the patient for the next several months.   Weight /BMI 01/22/2019 12/26/2018 10/31/2018  WEIGHT 325 lb 1.3 oz 321 lb 9.6 oz 324 lb  HEIGHT 5\' 5"  5\' 6"  5\' 6"   BMI 54.1 kg/m2 51.91 kg/m2 52.29 kg/m2

## 2019-01-29 NOTE — Assessment & Plan Note (Addendum)
Currently stable with  controlled blood pressure and no symptoms or signs of heart failure Followed by Cardiology Disabled because of heart disease and heart failure permanenetly

## 2019-01-29 NOTE — Assessment & Plan Note (Signed)
Managed by Endo and controlled Kendra Hanson is reminded of the importance of commitment to daily physical activity for 30 minutes or more, as able and the need to limit carbohydrate intake to 30 to 60 grams per meal to help with blood sugar control.   The need to take medication as prescribed, test blood sugar as directed, and to call between visits if there is a concern that blood sugar is uncontrolled is also discussed.   Kendra Hanson is reminded of the importance of daily foot exam, annual eye examination, and good blood sugar, blood pressure and cholesterol control.  Diabetic Labs Latest Ref Rng & Units 10/26/2018 06/26/2018 03/13/2018 03/06/2018 12/04/2017  HbA1c 4.8 - 5.6 % 7.4(H) 7.1(H) 7.2(H) - 8.2(H)  Microalbumin Not Estab. ug/mL - - - 4.7(H) -  Micro/Creat Ratio 0.0 - 30.0 mg/g creat - - - 8.0 -  Chol 100 - 199 mg/dL 175 - 194 - -  HDL >39 mg/dL 53 - 53 - -  Calc LDL 0 - 99 mg/dL 105(H) - 114(H) - -  Triglycerides 0 - 149 mg/dL 87 - 133 - -  Creatinine 0.57 - 1.00 mg/dL 1.40(H) 1.49(H) 1.54(H) - -   BP/Weight 01/22/2019 12/26/2018 10/31/2018 07/02/2018 06/25/2018 03/05/2018 0/92/9574  Systolic BP 734 037 096 438 381 840 375  Diastolic BP 86 70 78 72 80 80 64  Wt. (Lbs) 325.08 321.6 324 317 318 325 318  BMI 54.1 51.91 52.29 51.17 52.92 54.08 52.92   Foot/eye exam completion dates 06/25/2018 04/23/2017  Foot Form Completion Done Done

## 2019-01-29 NOTE — Assessment & Plan Note (Signed)
Managed by endo Kendra Hanson is reminded of the importance of commitment to daily physical activity for 30 minutes or more, as able and the need to limit carbohydrate intake to 30 to 60 grams per meal to help with blood sugar control.   The need to take medication as prescribed, test blood sugar as directed, and to call between visits if there is a concern that blood sugar is uncontrolled is also discussed.   Kendra Hanson is reminded of the importance of daily foot exam, annual eye examination, and good blood sugar, blood pressure and cholesterol control.  Diabetic Labs Latest Ref Rng & Units 10/26/2018 06/26/2018 03/13/2018 03/06/2018 12/04/2017  HbA1c 4.8 - 5.6 % 7.4(H) 7.1(H) 7.2(H) - 8.2(H)  Microalbumin Not Estab. ug/mL - - - 4.7(H) -  Micro/Creat Ratio 0.0 - 30.0 mg/g creat - - - 8.0 -  Chol 100 - 199 mg/dL 175 - 194 - -  HDL >39 mg/dL 53 - 53 - -  Calc LDL 0 - 99 mg/dL 105(H) - 114(H) - -  Triglycerides 0 - 149 mg/dL 87 - 133 - -  Creatinine 0.57 - 1.00 mg/dL 1.40(H) 1.49(H) 1.54(H) - -   BP/Weight 01/22/2019 12/26/2018 10/31/2018 07/02/2018 06/25/2018 03/05/2018 1/77/1165  Systolic BP 790 383 338 329 191 660 600  Diastolic BP 86 70 78 72 80 80 64  Wt. (Lbs) 325.08 321.6 324 317 318 325 318  BMI 54.1 51.91 52.29 51.17 52.92 54.08 52.92   Foot/eye exam completion dates 06/25/2018 04/23/2017  Foot Form Completion Done Done

## 2019-01-30 ENCOUNTER — Encounter: Payer: Self-pay | Admitting: Family Medicine

## 2019-01-30 ENCOUNTER — Telehealth: Payer: Self-pay | Admitting: Family Medicine

## 2019-01-30 DIAGNOSIS — N1832 Chronic kidney disease, stage 3b: Secondary | ICD-10-CM | POA: Insufficient documentation

## 2019-01-30 DIAGNOSIS — N183 Chronic kidney disease, stage 3 (moderate): Secondary | ICD-10-CM

## 2019-01-30 DIAGNOSIS — Z560 Unemployment, unspecified: Secondary | ICD-10-CM | POA: Insufficient documentation

## 2019-01-30 NOTE — Assessment & Plan Note (Addendum)
Kendra Hanson was  made permanently disabled since 10/2017 , due to multiple uncontrolled co morbidities leading to  heart and kidney disease, and disabling osteoarthritis, limiting mobility and use of upper extremities and hands

## 2019-01-30 NOTE — Assessment & Plan Note (Signed)
Significant weakness  And pain in both hands persisting, disabled because of this pericemently

## 2019-01-30 NOTE — Assessment & Plan Note (Signed)
Marked limitation in mobility and uncontrolled pain and stiffness, disabled because of severe osteoarthritis of knees permanenetly

## 2019-01-30 NOTE — Assessment & Plan Note (Signed)
Poor and unsteady gait, limiting safe mobility , contributing to permanent disability

## 2019-01-30 NOTE — Telephone Encounter (Signed)
Please note, 2 separate forms have been fully completed and faxed this pm Brandi. I have sent pt a message to collect her copies tomorrow Please ensure we have the originals/ copies safely stored. When pt collects the forms , she should not be given the notes sent with the forms  The one form from [place] has been completed by me pages 8 and  9 , this needs to be copied and faxed by you tomorrow.  Pages 4 to 7 of her [employer] paperwork is for her to complete  I have sent her a message about this also  pls call me with questions

## 2019-01-30 NOTE — Assessment & Plan Note (Signed)
Chronic kidney disease contributing to permanent disability, stage 3

## 2019-01-31 ENCOUNTER — Encounter: Payer: Self-pay | Admitting: Family Medicine

## 2019-01-31 DIAGNOSIS — I10 Essential (primary) hypertension: Secondary | ICD-10-CM

## 2019-01-31 DIAGNOSIS — E785 Hyperlipidemia, unspecified: Secondary | ICD-10-CM

## 2019-01-31 DIAGNOSIS — E1121 Type 2 diabetes mellitus with diabetic nephropathy: Secondary | ICD-10-CM

## 2019-01-31 DIAGNOSIS — R600 Localized edema: Secondary | ICD-10-CM

## 2019-01-31 NOTE — Telephone Encounter (Signed)
Copies Completed, Pt has been called. All is well.  Thanks Betsy May

## 2019-02-18 ENCOUNTER — Telehealth: Payer: Self-pay | Admitting: *Deleted

## 2019-02-18 ENCOUNTER — Telehealth: Payer: Self-pay

## 2019-02-18 ENCOUNTER — Encounter: Payer: Self-pay | Admitting: Family Medicine

## 2019-02-18 ENCOUNTER — Other Ambulatory Visit: Payer: Self-pay | Admitting: "Endocrinology

## 2019-02-18 DIAGNOSIS — E782 Mixed hyperlipidemia: Secondary | ICD-10-CM

## 2019-02-18 DIAGNOSIS — N183 Chronic kidney disease, stage 3 unspecified: Secondary | ICD-10-CM

## 2019-02-18 DIAGNOSIS — E1165 Type 2 diabetes mellitus with hyperglycemia: Principal | ICD-10-CM

## 2019-02-18 DIAGNOSIS — E1122 Type 2 diabetes mellitus with diabetic chronic kidney disease: Secondary | ICD-10-CM

## 2019-02-18 DIAGNOSIS — IMO0002 Reserved for concepts with insufficient information to code with codable children: Secondary | ICD-10-CM

## 2019-02-18 DIAGNOSIS — E559 Vitamin D deficiency, unspecified: Secondary | ICD-10-CM

## 2019-02-18 NOTE — Telephone Encounter (Signed)
Pt called needing a new cpap machine and a new mask. Asked if she had saw someone outside of her primary for this machine she said yes but it was a long time ago. Foster told her to get with her primary

## 2019-02-18 NOTE — Telephone Encounter (Signed)
I already sent her first message to dr simpson but she doesn't prescribe cpap machines, that's why she was referred out. Will let you know her response

## 2019-02-19 ENCOUNTER — Other Ambulatory Visit: Payer: Self-pay

## 2019-02-19 DIAGNOSIS — IMO0002 Reserved for concepts with insufficient information to code with codable children: Secondary | ICD-10-CM

## 2019-02-19 DIAGNOSIS — E1165 Type 2 diabetes mellitus with hyperglycemia: Secondary | ICD-10-CM

## 2019-02-19 DIAGNOSIS — E1122 Type 2 diabetes mellitus with diabetic chronic kidney disease: Secondary | ICD-10-CM

## 2019-02-19 DIAGNOSIS — N183 Chronic kidney disease, stage 3 unspecified: Secondary | ICD-10-CM

## 2019-02-19 DIAGNOSIS — E559 Vitamin D deficiency, unspecified: Secondary | ICD-10-CM

## 2019-02-19 DIAGNOSIS — E782 Mixed hyperlipidemia: Secondary | ICD-10-CM

## 2019-02-20 ENCOUNTER — Other Ambulatory Visit: Payer: Self-pay | Admitting: "Endocrinology

## 2019-02-20 ENCOUNTER — Other Ambulatory Visit: Payer: Self-pay | Admitting: Family Medicine

## 2019-02-20 LAB — VITAMIN D 25 HYDROXY (VIT D DEFICIENCY, FRACTURES): Vit D, 25-Hydroxy: 34.4 ng/mL (ref 30.0–100.0)

## 2019-02-20 LAB — COMPREHENSIVE METABOLIC PANEL
ALT: 8 IU/L (ref 0–32)
AST: 9 IU/L (ref 0–40)
Albumin/Globulin Ratio: 1.2 (ref 1.2–2.2)
Albumin: 4.2 g/dL (ref 3.8–4.9)
Alkaline Phosphatase: 148 IU/L — ABNORMAL HIGH (ref 39–117)
BUN/Creatinine Ratio: 14 (ref 9–23)
BUN: 21 mg/dL (ref 6–24)
Bilirubin Total: 0.4 mg/dL (ref 0.0–1.2)
CO2: 27 mmol/L (ref 20–29)
Calcium: 9.5 mg/dL (ref 8.7–10.2)
Chloride: 98 mmol/L (ref 96–106)
Creatinine, Ser: 1.48 mg/dL — ABNORMAL HIGH (ref 0.57–1.00)
GFR calc Af Amer: 44 mL/min/{1.73_m2} — ABNORMAL LOW (ref >59)
GFR calc non Af Amer: 38 mL/min/{1.73_m2} — ABNORMAL LOW (ref >59)
Globulin, Total: 3.4 g/dL (ref 1.5–4.5)
Glucose: 182 mg/dL — ABNORMAL HIGH (ref 65–99)
Potassium: 4.7 mmol/L (ref 3.5–5.2)
Sodium: 140 mmol/L (ref 134–144)
Total Protein: 7.6 g/dL (ref 6.0–8.5)

## 2019-02-20 LAB — LIPID PANEL
Chol/HDL Ratio: 4.9 ratio — ABNORMAL HIGH (ref 0.0–4.4)
Cholesterol, Total: 263 mg/dL — ABNORMAL HIGH (ref 100–199)
HDL: 54 mg/dL (ref >39)
LDL Calculated: 187 mg/dL — ABNORMAL HIGH (ref 0–99)
Triglycerides: 108 mg/dL (ref 0–149)
VLDL Cholesterol Cal: 22 mg/dL (ref 5–40)

## 2019-02-20 LAB — MICROALBUMIN / CREATININE URINE RATIO
Creatinine, Urine: 138.1 mg/dL
Microalb/Creat Ratio: 14 mg/g creat (ref 0–29)
Microalbumin, Urine: 19.4 ug/mL

## 2019-02-20 LAB — HEMOGLOBIN A1C
Est. average glucose Bld gHb Est-mCnc: 200 mg/dL
Hgb A1c MFr Bld: 8.6 % — ABNORMAL HIGH (ref 4.8–5.6)

## 2019-02-26 ENCOUNTER — Encounter: Payer: Self-pay | Admitting: "Endocrinology

## 2019-02-27 ENCOUNTER — Encounter: Payer: Self-pay | Admitting: "Endocrinology

## 2019-02-27 ENCOUNTER — Ambulatory Visit (INDEPENDENT_AMBULATORY_CARE_PROVIDER_SITE_OTHER): Payer: Commercial Managed Care - PPO | Admitting: "Endocrinology

## 2019-02-27 DIAGNOSIS — E1122 Type 2 diabetes mellitus with diabetic chronic kidney disease: Secondary | ICD-10-CM | POA: Diagnosis not present

## 2019-02-27 DIAGNOSIS — I129 Hypertensive chronic kidney disease with stage 1 through stage 4 chronic kidney disease, or unspecified chronic kidney disease: Secondary | ICD-10-CM | POA: Diagnosis not present

## 2019-02-27 DIAGNOSIS — E559 Vitamin D deficiency, unspecified: Secondary | ICD-10-CM

## 2019-02-27 DIAGNOSIS — IMO0002 Reserved for concepts with insufficient information to code with codable children: Secondary | ICD-10-CM

## 2019-02-27 DIAGNOSIS — N183 Chronic kidney disease, stage 3 unspecified: Secondary | ICD-10-CM

## 2019-02-27 DIAGNOSIS — E782 Mixed hyperlipidemia: Secondary | ICD-10-CM

## 2019-02-27 DIAGNOSIS — E1165 Type 2 diabetes mellitus with hyperglycemia: Secondary | ICD-10-CM

## 2019-02-27 DIAGNOSIS — I10 Essential (primary) hypertension: Secondary | ICD-10-CM

## 2019-02-27 MED ORDER — ROSUVASTATIN CALCIUM 5 MG PO CPSP: 5.0000 mg | ORAL_CAPSULE | Freq: Every day | ORAL | 3 refills | Status: DC

## 2019-02-27 MED ORDER — INSULIN GLARGINE 100 UNIT/ML SOLOSTAR PEN: 60.0000 [IU] | PEN_INJECTOR | Freq: Every day | SUBCUTANEOUS | 2 refills | Status: DC

## 2019-02-27 MED ORDER — GLIPIZIDE ER 5 MG PO TB24: 5.0000 mg | ORAL_TABLET | Freq: Every day | ORAL | 2 refills | Status: DC

## 2019-02-27 NOTE — Progress Notes (Signed)
                                                    Endocrinology Telephone Visit Follow up Note -During COVID -19 Pandemic        02/27/2019, 4:43 PM   Subjective:    [name], female    DOB: 03/29/1961.  Kendra Hanson is being in telephone visit for follow-up in  management of currently uncontrolled symptomatic diabetes requested by  Simpson, Margaret E, MD.   Past Medical History:  Diagnosis Date  . Anemia   . Fibroids    Uterine  . GERD (gastroesophageal reflux disease)   . Hyperlipidemia 2008   Lipid profile in 04/2011:136, 53, 43  . Hypertension 2008   Normal CBC and CMet in 2012; negative stress nuclear in 2006- patient asymptomatic  . Insulin dependent diabetes mellitus (HCC) 1996  . Multiple allergies    perennial  . Normocytic normochromic anemia 12/27/2015  . Obesity   . PONV (postoperative nausea and vomiting)   . Sleep apnea    CPAP   Past Surgical History:  Procedure Laterality Date  . COLONOSCOPY N/A 04/05/2015   Procedure: COLONOSCOPY;  Surgeon: Najeeb U Rehman, MD;  Location: [facility];  Service: Endoscopy;  Laterality: N/A;  730  . DENTAL SURGERY    . ESOPHAGOGASTRODUODENOSCOPY N/A 01/21/2016   Procedure: ESOPHAGOGASTRODUODENOSCOPY (EGD);  Surgeon: Najeeb U Rehman, MD;  Location: [facility];  Service: Endoscopy;  Laterality: N/A;  11:15  . REFRACTIVE SURGERY  2011   Bilateral, two seperate occasions first in 2006  . REMOVAL OF IMPLANT    . RETINAL DETACHMENT SURGERY Bilateral 05/2005   Social History   Socioeconomic History  . Marital status: Married    Spouse name: Not on file  . Number of children: Not on file  . Years of education: Not on file  . Highest education level: Not on file  Occupational History  . Occupation: Employed    Employer: [employer]  Social Needs  . Financial resource strain: Not on file  . Food insecurity:    Worry: Not on file    Inability: Not on file  . Transportation  needs:    Medical: Not on file    Non-medical: Not on file  Tobacco Use  . Smoking status: Never Smoker  . Smokeless tobacco: Never Used  Substance and Sexual Activity  . Alcohol use: No  . Drug use: No  . Sexual activity: Yes    Birth control/protection: None, Implant  Lifestyle  . Physical activity:    Days per week: Not on file    Minutes per session: Not on file  . Stress: Not on file  Relationships  . Social connections:    Talks on phone: Not on file    Gets together: Not on file    Attends religious service: Not on file    Active member of club or organization: Not on file    Attends meetings of clubs or organizations: Not on file    Relationship status: Not on file  Other Topics Concern  . Not on file  Social History Narrative   Married with 2 children   Outpatient Encounter Medications as of 02/27/2019  Medication Sig  . amLODipine (NORVASC) 5 MG tablet TAKE 1 TABLET BY MOUTH ONCE   DAILY.  . azelastine (ASTELIN) 0.1 % nasal spray Place 2 sprays into both nostrils 2 (two) times daily. Use in each nostril as directed  . blood glucose meter kit and supplies Three times daily testing dx e11.65 (dispense formulary preferred Accuchek compact plus)  . Blood Glucose Monitoring Suppl (FREESTYLE LITE) DEVI   . cetirizine (ZYRTEC) 10 MG tablet Take 10 mg by mouth daily.  . cyclobenzaprine (FLEXERIL) 10 MG tablet Take 1 tablet (10 mg total) by mouth 3 (three) times daily as needed for muscle spasms.  . diltiazem (TIAZAC) 360 MG 24 hr capsule TAKE 1 CAPSULE BY MOUTH ONCE DAILY.  . Docusate Calcium (STOOL SOFTENER PO) Take by mouth daily.  . ergocalciferol (VITAMIN D2) 50000 units capsule Take 1 capsule (50,000 Units total) by mouth once a week. One capsule once weekly  . estradiol (VIVELLE-DOT) 0.0375 MG/24HR Place 1 patch onto the skin 3 (three) times a week. Monday, Wednesday, and Saturday.  . ferrous sulfate 325 (65 FE) MG tablet Take 325 mg by mouth daily with breakfast.  .  fluticasone (FLONASE) 50 MCG/ACT nasal spray Place 2 sprays into both nostrils daily.  . glipiZIDE (GLUCOTROL XL) 5 MG 24 hr tablet Take 1 tablet (5 mg total) by mouth daily with breakfast.  . glucose blood (FREESTYLE TEST STRIPS) test strip Three times daily testing dx e11.65  . Insulin Glargine (LANTUS SOLOSTAR) 100 UNIT/ML Solostar Pen Inject 60 Units into the skin at bedtime.  . Lancets (FREESTYLE) lancets Use as instructed three times daily dx e11.65  . montelukast (SINGULAIR) 10 MG tablet Take 1 tablet (10 mg total) by mouth at bedtime.  . ondansetron (ZOFRAN) 4 MG tablet Take 1 tablet (4 mg total) by mouth 2 (two) times daily as needed for nausea or vomiting.  . pantoprazole (PROTONIX) 20 MG tablet Take 1 tablet (20 mg total) by mouth daily.  . potassium chloride (K-DUR) 10 MEQ tablet TAKE 1 TABLET BY MOUTH 3 TIMES DAILY.  . progesterone (PROMETRIUM) 100 MG capsule Take 100 mg by mouth daily.  . Rosuvastatin Calcium 5 MG CPSP Take 5 mg by mouth at bedtime.  . topiramate (TOPAMAX) 25 MG tablet   . torsemide (DEMADEX) 20 MG tablet Take one tab by mouth (20mg) every other day, alternating with two tabs (40mg) every other day  . UNABLE TO FIND Diabetes shoes x 1 and inserts x 3 Dx:E11.29  . UNABLE TO FIND Compression hose (knee high) 15-20mm 1 pair Dx bilateral leg swelling  . UNABLE TO FIND Diabetic shoes x 1 inserts x 3  Dx E11.9  . UNIFINE PENTIPS 31G X 8 MM MISC USE AS DIRECTED  . VICTOZA 18 MG/3ML SOPN INJECT 0.3MLS (1.8MG TOTAL) INTO THE SKIN DAILY.  . vitamin B-12 (CYANOCOBALAMIN) 100 MCG tablet Take 100 mcg by mouth daily.  . [DISCONTINUED] Insulin Glargine (LANTUS SOLOSTAR) 100 UNIT/ML Solostar Pen Inject 50 Units into the skin at bedtime.  . [DISCONTINUED] pravastatin (PRAVACHOL) 40 MG tablet TAKE 1 TABLET ONCE DAILY.   No facility-administered encounter medications on file as of 02/27/2019.     ALLERGIES: Allergies  Allergen Reactions  . Nsaids     Kidney function   .  Diclofenac Other (See Comments)    hallucination  . Oxycodone     Vomiting     VACCINATION STATUS: Immunization History  Administered Date(s) Administered  . Influenza Split 08/14/2014  . Influenza,inj,Quad PF,6+ Mos 08/02/2018  . Pneumococcal Conjugate-13 02/01/2015  . Pneumococcal Polysaccharide-23 05/23/2005, 03/28/2016  . Tdap 10/08/2013  .   Zoster Recombinat (Shingrix) 10/31/2018    Diabetes  She presents for her follow-up diabetic visit. She has type 2 diabetes mellitus. Onset time: She was diagnosed at approximate age of 48 years. Her disease course has been worsening. Pertinent negatives for hypoglycemia include no confusion, headaches, pallor, seizures or sweats. Pertinent negatives for diabetes include no chest pain, no fatigue, no polydipsia, no polyphagia and no polyuria. There are no hypoglycemic complications. Symptoms are worsening. Diabetic complications include nephropathy. Risk factors for coronary artery disease include diabetes mellitus, dyslipidemia, hypertension, obesity and sedentary lifestyle. Current diabetic treatment includes insulin injections. Her weight is increasing steadily. She is following a generally unhealthy diet. When asked about meal planning, she reported none. She has not had a previous visit with a dietitian. She never participates in exercise. Her home blood glucose trend is fluctuating minimally. (Patient has no further hypoglycemic episodes.  Her previous visit labs show A1c of 8.6% increasing from 7.4%.) An ACE inhibitor/angiotensin II receptor blocker is not being taken. Eye exam is current.  Hyperlipidemia  This is a chronic problem. The problem is uncontrolled. Exacerbating diseases include diabetes and obesity. Pertinent negatives include no chest pain, myalgias or shortness of breath. Current antihyperlipidemic treatment includes statins. Risk factors for coronary artery disease include dyslipidemia, diabetes mellitus, a sedentary lifestyle,  post-menopausal and family history.     Objective:    There were no vitals taken for this visit.  Wt Readings from Last 3 Encounters:  01/22/19 (!) 325 lb 1.3 oz (147.5 kg)  12/26/18 (!) 321 lb 9.6 oz (145.9 kg)  10/31/18 (!) 324 lb (147 kg)        CMP ( most recent) CMP     Component Value Date/Time   NA 140 02/19/2019 1100   K 4.7 02/19/2019 1100   CL 98 02/19/2019 1100   CO2 27 02/19/2019 1100   GLUCOSE 182 (H) 02/19/2019 1100   GLUCOSE 190 (H) 10/02/2017 1144   BUN 21 02/19/2019 1100   CREATININE 1.48 (H) 02/19/2019 1100   CREATININE 1.72 (H) 10/02/2017 1144   CALCIUM 9.5 02/19/2019 1100   PROT 7.6 02/19/2019 1100   ALBUMIN 4.2 02/19/2019 1100   AST 9 02/19/2019 1100   ALT 8 02/19/2019 1100   ALKPHOS 148 (H) 02/19/2019 1100   BILITOT 0.4 02/19/2019 1100   GFRNONAA 38 (L) 02/19/2019 1100   GFRNONAA 34 (L) 09/11/2017 1612   GFRAA 44 (L) 02/19/2019 1100   GFRAA 40 (L) 09/11/2017 1612     Diabetic Labs (most recent): Lab Results  Component Value Date   HGBA1C 8.6 (H) 02/19/2019   HGBA1C 7.4 (H) 10/26/2018   HGBA1C 7.1 (H) 06/26/2018     Lipid Panel ( most recent) Lipid Panel     Component Value Date/Time   CHOL 263 (H) 02/19/2019 1100   TRIG 108 02/19/2019 1100   HDL 54 02/19/2019 1100   CHOLHDL 4.9 (H) 02/19/2019 1100   CHOLHDL 2.9 04/12/2017 1121   VLDL 15 04/12/2017 1121   LDLCALC 187 (H) 02/19/2019 1100      Lab Results  Component Value Date   TSH 3.180 10/26/2018   TSH 1.61 09/11/2017   TSH 2.10 08/29/2017   TSH 2.35 07/01/2016   TSH 2.926 08/06/2015   TSH 2.015 05/30/2014   TSH 2.826 01/24/2013   TSH 3.197 06/03/2012   TSH 1.773 04/18/2011   TSH 2.614 10/28/2007      Assessment & Plan:   1. Uncontrolled type 2 diabetes mellitus with stage 3 chronic  kidney disease (HCC)  - Kendra Hanson has currently uncontrolled symptomatic type 2 DM since 58 years of age. -She reports feeling better with no further hypoglycemic episodes.   Her A1c is up to 8.6% from 7.4%.  Reports above target postprandial glycemic profile.   -her diabetes is complicated by stage 3 renal insufficiency and she remains at a high risk for more acute and chronic complications which include CAD, CVA, CKD, retinopathy, and neuropathy. These are all discussed in detail with her.  - I have counseled her on diet management and weight loss, by adopting a carbohydrate restricted/protein rich diet. - she admits that there is a room for improvement in her food and drink choices. - Patient admits there is a room for improvement in her diet and drink choices. -  Suggestion is made for her to avoid simple carbohydrates  from her diet including Cakes, Sweet Desserts / Pastries, Ice Cream, Soda (diet and regular), Sweet Tea, Candies, Chips, Cookies, Store Bought Juices, Alcohol in Excess of  1-2 drinks a day, Artificial Sweeteners, and "Sugar-free" Products. This will help patient to have stable blood glucose profile and potentially avoid unintended weight gain.   - I encouraged her to switch to  unprocessed or minimally processed complex starch and increased protein intake (animal or plant source), fruits, and vegetables.  - she is advised to stick to a routine mealtimes to eat 3 meals  a day and avoid unnecessary snacks ( to snack only to correct hypoglycemia).    - I have approached her with the following individualized plan to manage diabetes and patient agrees:   -Based on her presentation with above target postprandial glycemic profile, she will need additional treatment.  The main development is that she has no further hypoglycemic episodes.   -She will not tolerate higher dose of Lantus.  She is advised to continue Lantus 60 units daily at bedtime, advised to continue Victoza 1.8 mg daily.  I discussed and added glipizide 5 mg XL daily at breakfast.     -She will monitor blood glucose 2 times a day-daily before breakfast and at bedtime.    - she is  encouraged to call clinic for fasting blood glucose levels less than 70 or above 200 mg /dl.   - she is not a candidate for metformin, SGLT2 inhibitors due to concurrent renal insufficiency.  - Patient specific target  A1c;  LDL, HDL, Triglycerides, and  Waist Circumference were discussed in detail.  2) Blood Pressure /Hypertension: She is advised to continue to monitor blood pressure at least once a week and report if readings are above 160/90.   she is advised to continue her current medications including diltiazem 360 mg p.o. daily with breakfast . 3) Lipids/Hyperlipidemia:   Review of her recent lipid panel showed  uncontrolled  LDL at 187.  She did not tolerate her pravastatin and discontinued it.  I discussed and initiated low-dose Crestor 5 mg p.o. nightly.  This medication will be advanced if tolerated, if not, she will be considered for Repatha on subsequent visits.     - she is  advised to maintain close follow up with Simpson, Margaret E, MD for primary care needs, as well as her other providers for optimal and coordinated care.  - Patient Care Time Today:  25 min, of which >50% was spent in reviewing her  current and  previous labs/studies, blood glucose readings, previous treatments, and medications doses and developing a plan for long-term care based on   the latest recommendations for standards of care.  Kendra Hanson participated in the discussions, expressed understanding, and voiced agreement with the above plans.  All questions were answered to her satisfaction. she is encouraged to contact clinic should she have any questions or concerns prior to her return visit.     Follow up plan: - Return for Follow up with Pre-visit Labs, Meter, and Logs.  Glade Lloyd, MD [facility] [address] [place], [place] [address] Phone: [phone]  Fax: [phone]    02/27/2019, 4:43 PM  This note was partially dictated with  voice recognition software. Similar sounding words can be transcribed inadequately or may not  be corrected upon review.

## 2019-03-04 ENCOUNTER — Encounter: Payer: Self-pay | Admitting: Family Medicine

## 2019-03-04 ENCOUNTER — Other Ambulatory Visit: Payer: Self-pay

## 2019-03-04 DIAGNOSIS — N183 Chronic kidney disease, stage 3 unspecified: Secondary | ICD-10-CM

## 2019-03-04 DIAGNOSIS — E1165 Type 2 diabetes mellitus with hyperglycemia: Principal | ICD-10-CM

## 2019-03-04 DIAGNOSIS — E1122 Type 2 diabetes mellitus with diabetic chronic kidney disease: Secondary | ICD-10-CM

## 2019-03-04 DIAGNOSIS — IMO0002 Reserved for concepts with insufficient information to code with codable children: Secondary | ICD-10-CM

## 2019-03-04 MED ORDER — GLIPIZIDE ER 5 MG PO TB24: 5.0000 mg | ORAL_TABLET | Freq: Every day | ORAL | 2 refills | Status: DC

## 2019-03-04 MED ORDER — AZELASTINE HCL 0.1 % NA SOLN: 2.0000 | Freq: Two times a day (BID) | NASAL | 12 refills | Status: DC

## 2019-03-04 MED ORDER — LIRAGLUTIDE 18 MG/3ML ~~LOC~~ SOPN: PEN_INJECTOR | SUBCUTANEOUS | 5 refills | Status: DC

## 2019-03-04 MED ORDER — DILTIAZEM HCL ER BEADS 360 MG PO CP24: 360.0000 mg | ORAL_CAPSULE | Freq: Every day | ORAL | 0 refills | Status: DC

## 2019-03-04 MED ORDER — CYCLOBENZAPRINE HCL 10 MG PO TABS: 10.0000 mg | ORAL_TABLET | Freq: Three times a day (TID) | ORAL | 0 refills | Status: DC | PRN

## 2019-03-04 MED ORDER — POTASSIUM CHLORIDE ER 10 MEQ PO TBCR: 10.0000 meq | EXTENDED_RELEASE_TABLET | Freq: Three times a day (TID) | ORAL | 0 refills | Status: DC

## 2019-03-04 MED ORDER — ERGOCALCIFEROL 1.25 MG (50000 UT) PO CAPS: 50000.0000 [IU] | ORAL_CAPSULE | ORAL | 0 refills | Status: DC

## 2019-03-04 MED ORDER — MONTELUKAST SODIUM 10 MG PO TABS: 10.0000 mg | ORAL_TABLET | Freq: Every day | ORAL | 0 refills | Status: DC

## 2019-03-04 MED ORDER — INSULIN GLARGINE 100 UNIT/ML SOLOSTAR PEN: 60.0000 [IU] | PEN_INJECTOR | Freq: Every day | SUBCUTANEOUS | 2 refills | Status: DC

## 2019-03-04 MED ORDER — FLUTICASONE PROPIONATE 50 MCG/ACT NA SUSP: 2.0000 | Freq: Every day | NASAL | 6 refills | Status: DC

## 2019-03-05 ENCOUNTER — Other Ambulatory Visit: Payer: Self-pay

## 2019-03-05 MED ORDER — FLUTICASONE PROPIONATE 50 MCG/ACT NA SUSP: 2.0000 | Freq: Every day | NASAL | 0 refills | Status: DC

## 2019-03-05 MED ORDER — AZELASTINE HCL 0.1 % NA SOLN: 2.0000 | Freq: Two times a day (BID) | NASAL | 0 refills | Status: DC

## 2019-03-06 ENCOUNTER — Ambulatory Visit: Payer: Self-pay | Admitting: Family Medicine

## 2019-03-11 ENCOUNTER — Ambulatory Visit: Payer: Self-pay | Admitting: Family Medicine

## 2019-03-11 ENCOUNTER — Other Ambulatory Visit: Payer: Self-pay | Admitting: Family Medicine

## 2019-04-15 ENCOUNTER — Telehealth: Payer: Self-pay

## 2019-04-15 ENCOUNTER — Encounter: Payer: Self-pay | Admitting: Family Medicine

## 2019-04-15 DIAGNOSIS — E559 Vitamin D deficiency, unspecified: Secondary | ICD-10-CM

## 2019-04-15 DIAGNOSIS — E782 Mixed hyperlipidemia: Secondary | ICD-10-CM

## 2019-04-15 NOTE — Telephone Encounter (Signed)
Labs ordered.

## 2019-04-17 ENCOUNTER — Encounter: Payer: Self-pay | Admitting: Family Medicine

## 2019-04-18 LAB — LIPID PANEL
Chol/HDL Ratio: 4.8 ratio — ABNORMAL HIGH (ref 0.0–4.4)
Cholesterol, Total: 247 mg/dL — ABNORMAL HIGH (ref 100–199)
HDL: 52 mg/dL (ref >39)
LDL Calculated: 176 mg/dL — ABNORMAL HIGH (ref 0–99)
Triglycerides: 93 mg/dL (ref 0–149)
VLDL Cholesterol Cal: 19 mg/dL (ref 5–40)

## 2019-04-18 LAB — VITAMIN D 25 HYDROXY (VIT D DEFICIENCY, FRACTURES): Vit D, 25-Hydroxy: 32.9 ng/mL (ref 30.0–100.0)

## 2019-04-21 ENCOUNTER — Other Ambulatory Visit: Payer: Self-pay

## 2019-04-21 ENCOUNTER — Encounter: Payer: Self-pay | Admitting: Family Medicine

## 2019-04-21 ENCOUNTER — Ambulatory Visit (INDEPENDENT_AMBULATORY_CARE_PROVIDER_SITE_OTHER): Payer: PRIVATE HEALTH INSURANCE | Admitting: Family Medicine

## 2019-04-21 VITALS — BP 126/66 | HR 90 | Resp 12 | Ht 65.0 in | Wt 325.1 lb

## 2019-04-21 DIAGNOSIS — R600 Localized edema: Secondary | ICD-10-CM

## 2019-04-21 DIAGNOSIS — M79661 Pain in right lower leg: Secondary | ICD-10-CM | POA: Diagnosis not present

## 2019-04-21 DIAGNOSIS — E1165 Type 2 diabetes mellitus with hyperglycemia: Secondary | ICD-10-CM

## 2019-04-21 DIAGNOSIS — M7989 Other specified soft tissue disorders: Secondary | ICD-10-CM

## 2019-04-21 DIAGNOSIS — N183 Chronic kidney disease, stage 3 unspecified: Secondary | ICD-10-CM

## 2019-04-21 DIAGNOSIS — IMO0002 Reserved for concepts with insufficient information to code with codable children: Secondary | ICD-10-CM

## 2019-04-21 DIAGNOSIS — I10 Essential (primary) hypertension: Secondary | ICD-10-CM

## 2019-04-21 DIAGNOSIS — E782 Mixed hyperlipidemia: Secondary | ICD-10-CM

## 2019-04-21 DIAGNOSIS — E1122 Type 2 diabetes mellitus with diabetic chronic kidney disease: Secondary | ICD-10-CM

## 2019-04-21 DIAGNOSIS — I739 Peripheral vascular disease, unspecified: Secondary | ICD-10-CM | POA: Diagnosis not present

## 2019-04-21 MED ORDER — METOLAZONE 2.5 MG PO TABS: 2.5000 mg | ORAL_TABLET | Freq: Every day | ORAL | 0 refills | Status: DC

## 2019-04-21 NOTE — Patient Instructions (Addendum)
F/U in 8 weeks, call if you need me before   Short course of additional diuretic is prescribed to help with bilateral leg swelling, zaroxolyn one every day for 1 week. Continue torsemide as before. Limit sodium and elevate your legs   You are referred to vascular surgeon to evaluate swelling  Creatinine Kinase for muscle pain and, cmp and EGFR, are being added to recent labs, result note will be sent'\  Please resume your cholesterol, take 3 nights per week for first week , then every night if tolerated  Social distancing. Frequent hand washing with soap and water Keeping your hands off of your face. These 3 practices will help to keep both you and your community healthy during this time. Please practice them faithfully!   Thanks for choosing [facility], we consider it a privelige to serve you.

## 2019-04-23 ENCOUNTER — Encounter: Payer: Self-pay | Admitting: Family Medicine

## 2019-04-27 ENCOUNTER — Encounter: Payer: Self-pay | Admitting: Family Medicine

## 2019-04-27 NOTE — Assessment & Plan Note (Signed)
Hyperlipidemia:Low fat diet discussed and encouraged.   Lipid Panel  Lab Results  Component Value Date   CHOL 247 (H) 04/17/2019   HDL 52 04/17/2019   LDLCALC 176 (H) 04/17/2019   TRIG 93 04/17/2019   CHOLHDL 4.8 (H) 04/17/2019   Uncontrolled, reports pain with meds, CK checked and normal, encouraged to try 3 times weekly medication and lower fat intake

## 2019-04-27 NOTE — Assessment & Plan Note (Signed)
Controlled, no change in medication DASH diet and commitment to daily physical activity for a minimum of 30 minutes discussed and encouraged, as a part of hypertension management. The importance of attaining a healthy weight is also discussed.  BP/Weight 04/21/2019 01/22/2019 12/26/2018 10/31/2018 07/02/2018 06/25/2018 9/57/4734  Systolic BP 037 096 438 381 840 375 436  Diastolic BP 66 86 70 78 72 80 80  Wt. (Lbs) 325.08 325.08 321.6 324 317 318 325  BMI 54.1 54.1 51.91 52.29 51.17 52.92 54.08

## 2019-04-27 NOTE — Assessment & Plan Note (Signed)
Chronic with localized swelling on posterior calf, negative homans' and no warmth or tenderness, hyperpigmented skin and dilated veins, needs vascular eval

## 2019-04-27 NOTE — Assessment & Plan Note (Signed)
Kendra Hanson is reminded of the importance of commitment to daily physical activity for 30 minutes or more, as able and the need to limit carbohydrate intake to 30 to 60 grams per meal to help with blood sugar control.   The need to take medication as prescribed, test blood sugar as directed, and to call between visits if there is a concern that blood sugar is uncontrolled is also discussed.   Kendra Hanson is reminded of the importance of daily foot exam, annual eye examination, and good blood sugar, blood pressure and cholesterol control. Managed by Endo, uncontrolled  Diabetic Labs Latest Ref Rng & Units 04/17/2019 02/19/2019 10/26/2018 06/26/2018 03/13/2018  HbA1c 4.8 - 5.6 % - 8.6(H) 7.4(H) 7.1(H) 7.2(H)  Microalbumin Not Estab. ug/mL - - - - -  Micro/Creat Ratio 0.0 - 30.0 mg/g creat - - - - -  Chol 100 - 199 mg/dL 247(H) 263(H) 175 - 194  HDL >39 mg/dL 52 54 53 - 53  Calc LDL 0 - 99 mg/dL 176(H) 187(H) 105(H) - 114(H)  Triglycerides 0 - 149 mg/dL 93 108 87 - 133  Creatinine 0.57 - 1.00 mg/dL 1.56(H) 1.48(H) 1.40(H) 1.49(H) 1.54(H)   BP/Weight 04/21/2019 01/22/2019 12/26/2018 10/31/2018 07/02/2018 06/25/2018 4/81/8590  Systolic BP 931 121 624 469 507 225 750  Diastolic BP 66 86 70 78 72 80 80  Wt. (Lbs) 325.08 325.08 321.6 324 317 318 325  BMI 54.1 54.1 51.91 52.29 51.17 52.92 54.08   Foot/eye exam completion dates 06/25/2018 04/23/2017  Foot Form Completion Done Done

## 2019-04-27 NOTE — Assessment & Plan Note (Signed)
  Patient re-educated about  the importance of commitment to a  minimum of 150 minutes of exercise per week as able.  The importance of healthy food choices with portion control discussed, as well as eating regularly and within a 12 hour window most days. The need to choose "clean , green" food 50 to 75% of the time is discussed, as well as to make water the primary drink and set a goal of 64 ounces water daily.    Weight /BMI 04/21/2019 01/22/2019 12/26/2018  WEIGHT 325 lb 1.3 oz 325 lb 1.3 oz 321 lb 9.6 oz  HEIGHT 5\' 5"  5\' 5"  5\' 6"   BMI 54.1 kg/m2 54.1 kg/m2 51.91 kg/m2

## 2019-04-27 NOTE — Assessment & Plan Note (Signed)
Additional zaroxolyn for 1 week  only

## 2019-04-27 NOTE — Progress Notes (Signed)
[name]     MRN: [id number]      DOB: 1961/07/16   HPI Kendra Hanson is here for follow up and re-evaluation of chronic medical conditions, medication management and review of any available recent lab and radiology data.  Preventive health is updated, specifically  Cancer screening and Immunization.   Questions or concerns regarding consultations or procedures which the PT has had in the interim are  addressed. The PT denies any adverse reactions to current medications since the last visit.  C/o discoloration of legs, concerned about circulation, also has chronic leg swelling Blood sugar continues to fluctuate based on diet, also continues to gain weight despite best efforts  ROS Denies recent fever or chills. Denies sinus pressure, nasal congestion, ear pain or sore throat. Denies chest congestion, productive cough or wheezing. Denies chest pains, palpitations c/o  leg swelling Denies abdominal pain, nausea, vomiting,diarrhea or constipation.   Denies dysuria, frequency, hesitancy or incontinence. C/o chronic  joint pain, swelling and limitation in mobility. Denies headaches, seizures, numbness, or tingling. Denies depression, anxiety or insomnia.  PE  BP 126/66   Pulse 90   Resp 12   Ht 5\' 5"  (1.651 m)   Wt (!) 325 lb 1.3 oz (147.5 kg)   SpO2 93%   BMI 54.10 kg/m   Patient alert and oriented and in no cardiopulmonary distress.  HEENT: No facial asymmetry, EOMI,   oropharynx pink and moist.  Neck supple no JVD, no mass.  Chest: Clear to auscultation bilaterally.  CVS: S1, S2 no murmurs, no S3.Regular rate.  ABD: Soft non tender.   Ext: one plus pitting  edema  MS: Adequate though reduced  ROM spine, shoulders, hips and knees.  Skin: Intact, no ulcerations or rash noted.  Psych: Good eye contact, normal affect. Memory intact anxious not  depressed appearing.  CNS: CN 2-12 intact, power,  normal throughout.no focal deficits noted.   Assessment & Plan   Lower extremity edema Additional zaroxolyn for 1 week  only  Pain and swelling of lower leg, right Chronic with localized swelling on posterior calf, negative homans' and no warmth or tenderness, hyperpigmented skin and dilated veins, needs vascular eval  Morbid obesity  Patient re-educated about  the importance of commitment to a  minimum of 150 minutes of exercise per week as able.  The importance of healthy food choices with portion control discussed, as well as eating regularly and within a 12 hour window most days. The need to choose "clean , green" food 50 to 75% of the time is discussed, as well as to make water the primary drink and set a goal of 64 ounces water daily.    Weight /BMI 04/21/2019 01/22/2019 12/26/2018  WEIGHT 325 lb 1.3 oz 325 lb 1.3 oz 321 lb 9.6 oz  HEIGHT 5\' 5"  5\' 5"  5\' 6"   BMI 54.1 kg/m2 54.1 kg/m2 51.91 kg/m2      Uncontrolled type 2 diabetes mellitus with stage 3 chronic kidney disease ([place]) Kendra Hanson is reminded of the importance of commitment to daily physical activity for 30 minutes or more, as able and the need to limit carbohydrate intake to 30 to 60 grams per meal to help with blood sugar control.   The need to take medication as prescribed, test blood sugar as directed, and to call between visits if there is a concern that blood sugar is uncontrolled is also discussed.   Kendra Hanson is reminded of the importance of daily foot exam, annual  eye examination, and good blood sugar, blood pressure and cholesterol control. Managed by Endo, uncontrolled  Diabetic Labs Latest Ref Rng & Units 04/17/2019 02/19/2019 10/26/2018 06/26/2018 03/13/2018  HbA1c 4.8 - 5.6 % - 8.6(H) 7.4(H) 7.1(H) 7.2(H)  Microalbumin Not Estab. ug/mL - - - - -  Micro/Creat Ratio 0.0 - 30.0 mg/g creat - - - - -  Chol 100 - 199 mg/dL 247(H) 263(H) 175 - 194  HDL >39 mg/dL 52 54 53 - 53  Calc LDL 0 - 99 mg/dL 176(H) 187(H) 105(H) - 114(H)  Triglycerides 0 - 149 mg/dL 93 108 87 - 133   Creatinine 0.57 - 1.00 mg/dL 1.56(H) 1.48(H) 1.40(H) 1.49(H) 1.54(H)   BP/Weight 04/21/2019 01/22/2019 12/26/2018 10/31/2018 07/02/2018 06/25/2018 2/42/3536  Systolic BP 144 315 400 867 619 509 326  Diastolic BP 66 86 70 78 72 80 80  Wt. (Lbs) 325.08 325.08 321.6 324 317 318 325  BMI 54.1 54.1 51.91 52.29 51.17 52.92 54.08   Foot/eye exam completion dates 06/25/2018 04/23/2017  Foot Form Completion Done Done        Mixed hyperlipidemia Hyperlipidemia:Low fat diet discussed and encouraged.   Lipid Panel  Lab Results  Component Value Date   CHOL 247 (H) 04/17/2019   HDL 52 04/17/2019   LDLCALC 176 (H) 04/17/2019   TRIG 93 04/17/2019   CHOLHDL 4.8 (H) 04/17/2019   Uncontrolled, reports pain with meds, CK checked and normal, encouraged to try 3 times weekly medication and lower fat intake    Essential hypertension, benign Controlled, no change in medication DASH diet and commitment to daily physical activity for a minimum of 30 minutes discussed and encouraged, as a part of hypertension management. The importance of attaining a healthy weight is also discussed.  BP/Weight 04/21/2019 01/22/2019 12/26/2018 10/31/2018 07/02/2018 06/25/2018 05/25/4579  Systolic BP 998 338 250 539 767 341 937  Diastolic BP 66 86 70 78 72 80 80  Wt. (Lbs) 325.08 325.08 321.6 324 317 318 325  BMI 54.1 54.1 51.91 52.29 51.17 52.92 54.08

## 2019-04-28 ENCOUNTER — Encounter: Payer: Self-pay | Admitting: Family Medicine

## 2019-04-28 ENCOUNTER — Telehealth: Payer: Self-pay | Admitting: *Deleted

## 2019-04-28 ENCOUNTER — Telehealth: Payer: Self-pay | Admitting: "Endocrinology

## 2019-04-28 ENCOUNTER — Ambulatory Visit (INDEPENDENT_AMBULATORY_CARE_PROVIDER_SITE_OTHER): Payer: PRIVATE HEALTH INSURANCE

## 2019-04-28 ENCOUNTER — Other Ambulatory Visit: Payer: Self-pay

## 2019-04-28 VITALS — Ht 65.0 in | Wt 313.0 lb

## 2019-04-28 DIAGNOSIS — Z23 Encounter for immunization: Secondary | ICD-10-CM | POA: Diagnosis not present

## 2019-04-28 NOTE — Telephone Encounter (Signed)
Also needs trulicity blood sugar meter  and strips sent in to [facility] so her insurance will cover it

## 2019-04-28 NOTE — Telephone Encounter (Signed)
Pt called said [place] needs a prior authorization on her victoza and would like a call back when this is taken care of.

## 2019-04-28 NOTE — Telephone Encounter (Signed)
Patient would like to know if her glipizide was changed since last visit because she is getting high readings.

## 2019-04-28 NOTE — Telephone Encounter (Signed)
It was not changed, continued at 5 mg daily at breakfast.  Ask  If she can give readings for 3 days.

## 2019-04-28 NOTE — Telephone Encounter (Signed)
Dr Nida Please advise 

## 2019-04-28 NOTE — Progress Notes (Signed)
#  2 injection given and series is complete

## 2019-04-29 ENCOUNTER — Other Ambulatory Visit: Payer: Self-pay

## 2019-04-29 ENCOUNTER — Other Ambulatory Visit: Payer: Self-pay | Admitting: Family Medicine

## 2019-04-29 MED ORDER — BLOOD GLUCOSE METER KIT: PACK | 0 refills | Status: DC

## 2019-04-29 NOTE — Telephone Encounter (Signed)
Meter and supplies sent to the pharmacy. Still need to PA the victoza

## 2019-04-29 NOTE — Telephone Encounter (Signed)
Update- pt is being followed by dr nida for her diabetes management. We should not be refilling any of her diabetes meds. Letter sent to pharmacy for them to d/c any refills for diabetic meds from Dr Moshe Cipro and send request to dr nida

## 2019-04-29 NOTE — Telephone Encounter (Signed)
She can increase her glipizide to 10mg  ( 2 tablets of 5mg  at once).

## 2019-04-29 NOTE — Telephone Encounter (Signed)
Patient is aware of the recommendation 

## 2019-04-29 NOTE — Telephone Encounter (Signed)
Fri 06/12 136-AM 266-Lunch 345-Bedtime  Sat 06/13 138 AM  Sun  06/14 188-AM 217-Lunch 370-Bedtime  Mon 06/15 98-Am 269-Bedtime  Please advise of any adjustments

## 2019-05-07 ENCOUNTER — Other Ambulatory Visit: Payer: Self-pay | Admitting: "Endocrinology

## 2019-05-12 ENCOUNTER — Other Ambulatory Visit: Payer: Self-pay

## 2019-05-12 ENCOUNTER — Other Ambulatory Visit: Payer: Self-pay | Admitting: Family Medicine

## 2019-05-12 ENCOUNTER — Encounter: Payer: Self-pay | Admitting: "Endocrinology

## 2019-05-12 ENCOUNTER — Ambulatory Visit (INDEPENDENT_AMBULATORY_CARE_PROVIDER_SITE_OTHER): Payer: PRIVATE HEALTH INSURANCE | Admitting: "Endocrinology

## 2019-05-12 VITALS — BP 133/67 | HR 86 | Ht 65.0 in | Wt 314.0 lb

## 2019-05-12 DIAGNOSIS — N183 Chronic kidney disease, stage 3 unspecified: Secondary | ICD-10-CM

## 2019-05-12 DIAGNOSIS — E782 Mixed hyperlipidemia: Secondary | ICD-10-CM

## 2019-05-12 DIAGNOSIS — E1122 Type 2 diabetes mellitus with diabetic chronic kidney disease: Secondary | ICD-10-CM | POA: Diagnosis not present

## 2019-05-12 DIAGNOSIS — I10 Essential (primary) hypertension: Secondary | ICD-10-CM

## 2019-05-12 DIAGNOSIS — E1165 Type 2 diabetes mellitus with hyperglycemia: Secondary | ICD-10-CM

## 2019-05-12 DIAGNOSIS — IMO0002 Reserved for concepts with insufficient information to code with codable children: Secondary | ICD-10-CM

## 2019-05-12 MED ORDER — BLOOD GLUCOSE METER KIT: PACK | 0 refills | Status: DC

## 2019-05-12 MED ORDER — ACCU-CHEK GUIDE VI STRP: ORAL_STRIP | 2 refills | Status: DC

## 2019-05-12 MED ORDER — NOVOLOG MIX 70/30 FLEXPEN (70-30) 100 UNIT/ML ~~LOC~~ SUPN: 40.0000 [IU] | PEN_INJECTOR | Freq: Two times a day (BID) | SUBCUTANEOUS | 2 refills | Status: DC

## 2019-05-12 MED ORDER — ACCU-CHEK GUIDE ME W/DEVICE KIT: 1.0000 | PACK | 0 refills | Status: DC

## 2019-05-12 NOTE — Patient Instructions (Signed)

## 2019-05-12 NOTE — Progress Notes (Signed)
Endocrinology Telephone Visit Follow up Note -During COVID -19 Pandemic        05/12/2019, 11:58 AM   Subjective:    [name], female    DOB: 11/13/1961.  Kendra Hanson returns today for her regular appointment due to the fact that she still blood glucose reading above 300 mg/dl yesterday.  -Her regular follow-up is for uncontrolled symptomatic diabetes. -PMD:   Fayrene Helper, MD.   Past Medical History:  Diagnosis Date  . Anemia   . Fibroids    Uterine  . GERD (gastroesophageal reflux disease)   . Hyperlipidemia 2008   Lipid profile in 04/2011:136, 53, 43  . Hypertension 2008   Normal CBC and CMet in 2012; negative stress nuclear in 2006- patient asymptomatic  . Insulin dependent diabetes mellitus ([place]) 1996  . Multiple allergies    perennial  . Normocytic normochromic anemia 12/27/2015  . Obesity   . PONV (postoperative nausea and vomiting)   . Sleep apnea    CPAP   Past Surgical History:  Procedure Laterality Date  . COLONOSCOPY N/A 04/05/2015   Procedure: COLONOSCOPY;  Surgeon: Rogene Houston, MD;  Location: [facility];  Service: Endoscopy;  Laterality: N/A;  730  . DENTAL SURGERY    . ESOPHAGOGASTRODUODENOSCOPY N/A 01/21/2016   Procedure: ESOPHAGOGASTRODUODENOSCOPY (EGD);  Surgeon: Rogene Houston, MD;  Location: [facility];  Service: Endoscopy;  Laterality: N/A;  11:15  . REFRACTIVE SURGERY  2011   Bilateral, two seperate occasions first in 2006  . REMOVAL OF IMPLANT    . RETINAL DETACHMENT SURGERY Bilateral 05/2005   Social History   Socioeconomic History  . Marital status: Married    Spouse name: Not on file  . Number of children: Not on file  . Years of education: Not on file  . Highest education level: Not on file  Occupational History  . Occupation: Employed    Fish farm manager: [employer]  Social Needs  . Financial resource strain: Not on file  . Food  insecurity    Worry: Not on file    Inability: Not on file  . Transportation needs    Medical: Not on file    Non-medical: Not on file  Tobacco Use  . Smoking status: Never Smoker  . Smokeless tobacco: Never Used  Substance and Sexual Activity  . Alcohol use: No  . Drug use: No  . Sexual activity: Yes    Birth control/protection: None, Implant  Lifestyle  . Physical activity    Days per week: Not on file    Minutes per session: Not on file  . Stress: Not on file  Relationships  . Social Herbalist on phone: Not on file    Gets together: Not on file    Attends religious service: Not on file    Active member of club or organization: Not on file    Attends meetings of clubs or organizations: Not on file    Relationship status: Not on file  Other Topics Concern  . Not on file  Social History Narrative   Married with 2 children   Outpatient Encounter Medications as of 05/12/2019  Medication  Sig  . amLODipine (NORVASC) 5 MG tablet TAKE 1 TABLET BY MOUTH ONCE DAILY.  Marland Kitchen azelastine (ASTELIN) 0.1 % nasal spray Place 2 sprays into both nostrils 2 (two) times daily. Use in each nostril as directed  . blood glucose meter kit and supplies Three times daily testing dx e11.65 (dispense formulary preferred Accuchek compact plus)  . blood glucose meter kit and supplies Three times daily testing DX E11.65 Dispense True Metric meter per formulary  . Blood Glucose Monitoring Suppl (ACCU-CHEK GUIDE ME) w/Device KIT 1 Piece by Does not apply route as directed.  . Blood Glucose Monitoring Suppl (FREESTYLE LITE) DEVI   . cetirizine (ZYRTEC) 10 MG tablet Take 10 mg by mouth daily.  . cyclobenzaprine (FLEXERIL) 10 MG tablet Take 1 tablet (10 mg total) by mouth 3 (three) times daily as needed for muscle spasms.  Marland Kitchen diltiazem (TIAZAC) 360 MG 24 hr capsule Take 1 capsule (360 mg total) by mouth daily.  Mariane Baumgarten Calcium (STOOL SOFTENER PO) Take by mouth daily.  . ergocalciferol (VITAMIN D2)  1.25 MG (50000 UT) capsule Take 1 capsule (50,000 Units total) by mouth once a week. One capsule once weekly  . estradiol (VIVELLE-DOT) 0.0375 MG/24HR Place 1 patch onto the skin 3 (three) times a week. Monday, Wednesday, and Saturday.  . ferrous sulfate 325 (65 FE) MG tablet Take 325 mg by mouth daily with breakfast.  . fluticasone (FLONASE) 50 MCG/ACT nasal spray Place 2 sprays into both nostrils daily.  Marland Kitchen glipiZIDE (GLUCOTROL XL) 5 MG 24 hr tablet Take 1 tablet (5 mg total) by mouth daily with breakfast.  . glucose blood (ACCU-CHEK GUIDE) test strip Use as instructed  . insulin aspart protamine - aspart (NOVOLOG MIX 70/30 FLEXPEN) (70-30) 100 UNIT/ML FlexPen Inject 0.4 mLs (40 Units total) into the skin 2 (two) times daily with a meal.  . Lancets (FREESTYLE) lancets Use as instructed three times daily dx e11.65  . metolazone (ZAROXOLYN) 2.5 MG tablet Take 1 tablet (2.5 mg total) by mouth daily.  . montelukast (SINGULAIR) 10 MG tablet Take 1 tablet (10 mg total) by mouth at bedtime.  . ondansetron (ZOFRAN) 4 MG tablet Take 1 tablet (4 mg total) by mouth 2 (two) times daily as needed for nausea or vomiting.  . pantoprazole (PROTONIX) 20 MG tablet Take 1 tablet (20 mg total) by mouth daily.  . potassium chloride (K-DUR) 10 MEQ tablet Take 1 tablet (10 mEq total) by mouth 3 (three) times daily.  . progesterone (PROMETRIUM) 100 MG capsule Take 100 mg by mouth daily.  . Rosuvastatin Calcium 5 MG CPSP Take 5 mg by mouth at bedtime. (Patient not taking: Reported on 04/21/2019)  . topiramate (TOPAMAX) 25 MG tablet   . torsemide (DEMADEX) 20 MG tablet Take one tab by mouth (74m) every other day, alternating with two tabs (413m every other day  . UNABLE TO FIND Diabetes shoes x 1 and inserts x 3 Dx:E11.29  . UNABLE TO FIND Compression hose (knee high) 15-2043m pair Dx bilateral leg swelling  . UNABLE TO FIND Diabetic shoes x 1 inserts x 3  Dx E11.9  . UNIFINE PENTIPS 31G X 8 MM MISC USE AS DIRECTED   . vitamin B-12 (CYANOCOBALAMIN) 100 MCG tablet Take 100 mcg by mouth daily.  . [DISCONTINUED] glucose blood (FREESTYLE TEST STRIPS) test strip Three times daily testing dx e11.65  . [DISCONTINUED] Insulin Glargine (LANTUS SOLOSTAR) 100 UNIT/ML Solostar Pen Inject 60 Units into the skin at bedtime.  . [DISCONTINUED] liraglutide (  VICTOZA) 18 MG/3ML SOPN USE 1.8 MG SUB-Q EACH DAY.   No facility-administered encounter medications on file as of 05/12/2019.     ALLERGIES: Allergies  Allergen Reactions  . Nsaids     Kidney function   . Diclofenac Other (See Comments)    hallucination  . Oxycodone     Vomiting     VACCINATION STATUS: Immunization History  Administered Date(s) Administered  . Influenza Split 08/14/2014  . Influenza,inj,Quad PF,6+ Mos 08/02/2018  . Pneumococcal Conjugate-13 02/01/2015  . Pneumococcal Polysaccharide-23 05/23/2005, 03/28/2016  . Tdap 10/08/2013  . Zoster Recombinat (Shingrix) 10/31/2018, 04/28/2019    Diabetes She presents for her follow-up diabetic visit. She has type 2 diabetes mellitus. Onset time: She was diagnosed at approximate age of 65 years. Her disease course has been worsening. Pertinent negatives for hypoglycemia include no confusion, headaches, pallor, seizures or sweats. Pertinent negatives for diabetes include no chest pain, no fatigue, no polydipsia, no polyphagia and no polyuria. There are no hypoglycemic complications. Symptoms are worsening. Diabetic complications include nephropathy. Risk factors for coronary artery disease include diabetes mellitus, dyslipidemia, hypertension, obesity and sedentary lifestyle. Current diabetic treatment includes insulin injections. Her weight is increasing steadily. She is following a generally unhealthy diet. When asked about meal planning, she reported none. She has not had a previous visit with a dietitian. She never participates in exercise. Her home blood glucose trend is fluctuating minimally. (Patient  has no further hypoglycemic episodes.  Her previous visit labs show A1c of 8.6% increasing from 7.4%.) An ACE inhibitor/angiotensin II receptor blocker is not being taken. Eye exam is current.  Hyperlipidemia This is a chronic problem. The problem is uncontrolled. Exacerbating diseases include diabetes and obesity. Pertinent negatives include no chest pain, myalgias or shortness of breath. Current antihyperlipidemic treatment includes statins. Risk factors for coronary artery disease include dyslipidemia, diabetes mellitus, a sedentary lifestyle, post-menopausal and family history.     Objective:    BP 133/67   Pulse 86   Ht 5' 5"  (1.651 m)   Wt (!) 314 lb (142.4 kg)   BMI 52.25 kg/m   Wt Readings from Last 3 Encounters:  05/12/19 (!) 314 lb (142.4 kg)  04/28/19 (!) 313 lb (142 kg)  04/21/19 (!) 325 lb 1.3 oz (147.5 kg)     CMP     Component Value Date/Time   NA 142 04/17/2019 1043   K 4.6 04/17/2019 1043   CL 102 04/17/2019 1043   CO2 24 04/17/2019 1043   GLUCOSE 73 04/17/2019 1043   GLUCOSE 190 (H) 10/02/2017 1144   BUN 17 04/17/2019 1043   CREATININE 1.56 (H) 04/17/2019 1043   CREATININE 1.72 (H) 10/02/2017 1144   CALCIUM 9.2 04/17/2019 1043   PROT 6.9 04/17/2019 1043   ALBUMIN 4.1 04/17/2019 1043   AST 11 04/17/2019 1043   ALT 9 04/17/2019 1043   ALKPHOS 134 (H) 04/17/2019 1043   BILITOT 0.4 04/17/2019 1043   GFRNONAA 36 (L) 04/17/2019 1043   GFRNONAA 34 (L) 09/11/2017 1612   GFRAA 42 (L) 04/17/2019 1043   GFRAA 40 (L) 09/11/2017 1612     Diabetic Labs (most recent): Lab Results  Component Value Date   HGBA1C 8.6 (H) 02/19/2019   HGBA1C 7.4 (H) 10/26/2018   HGBA1C 7.1 (H) 06/26/2018    Lipid Panel     Component Value Date/Time   CHOL 247 (H) 04/17/2019 1043   TRIG 93 04/17/2019 1043   HDL 52 04/17/2019 1043   CHOLHDL 4.8 (H) 04/17/2019 1043  CHOLHDL 2.9 04/12/2017 1121   VLDL 15 04/12/2017 1121   LDLCALC 176 (H) 04/17/2019 1043      Lab Results   Component Value Date   TSH 3.180 10/26/2018   TSH 1.61 09/11/2017   TSH 2.10 08/29/2017   TSH 2.35 07/01/2016   TSH 2.926 08/06/2015   TSH 2.015 05/30/2014   TSH 2.826 01/24/2013   TSH 3.197 06/03/2012   TSH 1.773 04/18/2011   TSH 2.614 10/28/2007      Assessment & Plan:   1. Uncontrolled type 2 diabetes mellitus with stage 3 chronic kidney disease ([place])  - Kendra Hanson has currently uncontrolled symptomatic type 2 DM since 58 years of age. -She returns today for her regular appointment due to an episode of hypoglycemia.  Her recent did not include A1c due to the fact that it is too soon to do.  Her last A1c was 8.6%.   -Her meter averages 2023 over the last 14 days.   -her diabetes is complicated by stage 3 renal insufficiency and she remains at a high risk for more acute and chronic complications which include CAD, CVA, CKD, retinopathy, and neuropathy. These are all discussed in detail with her.  - I have counseled her on diet management and weight loss, by adopting a carbohydrate restricted/protein rich diet.  - she  admits there is a room for improvement in her diet and drink choices. -  Suggestion is made for her to avoid simple carbohydrates  from her diet including Cakes, Sweet Desserts / Pastries, Ice Cream, Soda (diet and regular), Sweet Tea, Candies, Chips, Cookies, Sweet Pastries,  Store Bought Juices, Alcohol in Excess of  1-2 drinks a day, Artificial Sweeteners, Coffee Creamer, and "Sugar-free" Products. This will help patient to have stable blood glucose profile and potentially avoid unintended weight gain.  - I encouraged her to switch to  unprocessed or minimally processed complex starch and increased protein intake (animal or plant source), fruits, and vegetables.  - she is advised to stick to a routine mealtimes to eat 3 meals  a day and avoid unnecessary snacks ( to snack only to correct hypoglycemia).    - I have approached her with the following  individualized plan to manage diabetes and patient agrees:   -Based on her presentation with above target postprandial glycemic profile, she will need additional treatment.   -She claims that she has done better when she took NovoLog 70/30 and 1 2 return to that regimen instead of Lantus.   -I discussed and reinitiated NovoLog 70/30 40 units with breakfast and 40 units with supper, associated with strict monitoring of blood glucose 4 times a day-before meals and at bedtime return with her meter and logs and repeat labs in 4 weeks.   -She is advised to discontinue Lantus at this time.  She will continue Ozempic 0.5 mg subcutaneously weekly, as well as glipizide 5 mg XL daily at breakfast.    - she is encouraged to call clinic for fasting blood glucose levels less than 70 or above 200 mg /dl.   - she is not a candidate for metformin, SGLT2 inhibitors due to concurrent renal insufficiency.  - Patient specific target  A1c;  LDL, HDL, Triglycerides, and  Waist Circumference were discussed in detail.  2) Blood Pressure /Hypertension: Her blood pressure is controlled to target.     she is advised to continue her current medications including diltiazem 360 mg p.o. daily with breakfast .  3) Lipids/Hyperlipidemia:  Review of her recent lipid panel showed  uncontrolled  LDL at 187.  She did not tolerate her pravastatin and discontinued it.  I discussed and initiated low-dose Crestor 5 mg p.o. nightly.  This medication will be advanced if tolerated, if not, she will be considered for Repatha on subsequent visits.     - she is  advised to maintain close follow up with Fayrene Helper, MD for primary care needs, as well as her other providers for optimal and coordinated care.  - Patient Care Time Today:  25 min, of which >50% was spent in reviewing her  current and  previous labs/studies, previous treatments, her blood glucose readings, and medications doses and developing a plan for long-term care  based on the latest recommendations for standards of care.  Kendra Hanson participated in the discussions, expressed understanding, and voiced agreement with the above plans.  All questions were answered to her satisfaction. she is encouraged to contact clinic should she have any questions or concerns prior to her return visit.   Follow up plan: - Return in about 4 weeks (around 06/09/2019) for Meter, and Logs, Follow up with Pre-visit Labs, Meter, and Logs.  Glade Lloyd, MD [facility] [address] [place], [place] [address] Phone: [phone]  Fax: [phone]    05/12/2019, 11:58 AM  This note was partially dictated with voice recognition software. Similar sounding words can be transcribed inadequately or may not  be corrected upon review.

## 2019-05-13 ENCOUNTER — Other Ambulatory Visit: Payer: Self-pay | Admitting: Family Medicine

## 2019-05-15 ENCOUNTER — Encounter: Payer: Self-pay | Admitting: Family Medicine

## 2019-05-15 LAB — COMPREHENSIVE METABOLIC PANEL
ALT: 9 IU/L (ref 0–32)
AST: 11 IU/L (ref 0–40)
Albumin/Globulin Ratio: 1.5 (ref 1.2–2.2)
Albumin: 4.1 g/dL (ref 3.8–4.9)
Alkaline Phosphatase: 134 IU/L — ABNORMAL HIGH (ref 39–117)
BUN/Creatinine Ratio: 11 (ref 9–23)
BUN: 17 mg/dL (ref 6–24)
Bilirubin Total: 0.4 mg/dL (ref 0.0–1.2)
CO2: 24 mmol/L (ref 20–29)
Calcium: 9.2 mg/dL (ref 8.7–10.2)
Chloride: 102 mmol/L (ref 96–106)
Creatinine, Ser: 1.56 mg/dL — ABNORMAL HIGH (ref 0.57–1.00)
GFR calc Af Amer: 42 mL/min/{1.73_m2} — ABNORMAL LOW (ref >59)
GFR calc non Af Amer: 36 mL/min/{1.73_m2} — ABNORMAL LOW (ref >59)
Globulin, Total: 2.8 g/dL (ref 1.5–4.5)
Glucose: 73 mg/dL (ref 65–99)
Potassium: 4.6 mmol/L (ref 3.5–5.2)
Sodium: 142 mmol/L (ref 134–144)
Total Protein: 6.9 g/dL (ref 6.0–8.5)

## 2019-05-15 LAB — CK: Total CK: 177 U/L (ref 32–182)

## 2019-05-15 LAB — SPECIMEN STATUS REPORT

## 2019-05-27 ENCOUNTER — Encounter: Payer: Self-pay | Admitting: Family Medicine

## 2019-05-30 ENCOUNTER — Telehealth: Payer: Self-pay | Admitting: "Endocrinology

## 2019-05-30 ENCOUNTER — Other Ambulatory Visit: Payer: Self-pay

## 2019-05-30 MED ORDER — OZEMPIC (1 MG/DOSE) 2 MG/1.5ML ~~LOC~~ SOPN: 0.5000 mg | PEN_INJECTOR | SUBCUTANEOUS | 2 refills | Status: DC

## 2019-05-30 NOTE — Telephone Encounter (Signed)
Pts insurance will not cover Victoza or Ozempic. She uses [facility]. Please advise.

## 2019-06-02 ENCOUNTER — Telehealth: Payer: Self-pay

## 2019-06-02 NOTE — Telephone Encounter (Signed)
Pt notified via MyChart message.

## 2019-06-02 NOTE — Telephone Encounter (Signed)
She can get some samples of Ozempic until her next visit , and will discuss her options.

## 2019-06-03 NOTE — Telephone Encounter (Signed)
error 

## 2019-06-04 ENCOUNTER — Other Ambulatory Visit: Payer: Self-pay | Admitting: "Endocrinology

## 2019-06-05 LAB — COMPREHENSIVE METABOLIC PANEL
ALT: 12 IU/L (ref 0–32)
AST: 12 IU/L (ref 0–40)
Albumin/Globulin Ratio: 1.3 (ref 1.2–2.2)
Albumin: 4.1 g/dL (ref 3.8–4.9)
Alkaline Phosphatase: 114 IU/L (ref 39–117)
BUN/Creatinine Ratio: 13 (ref 9–23)
BUN: 18 mg/dL (ref 6–24)
Bilirubin Total: 0.4 mg/dL (ref 0.0–1.2)
CO2: 26 mmol/L (ref 20–29)
Calcium: 9.3 mg/dL (ref 8.7–10.2)
Chloride: 100 mmol/L (ref 96–106)
Creatinine, Ser: 1.44 mg/dL — ABNORMAL HIGH (ref 0.57–1.00)
GFR calc Af Amer: 46 mL/min/{1.73_m2} — ABNORMAL LOW (ref >59)
GFR calc non Af Amer: 40 mL/min/{1.73_m2} — ABNORMAL LOW (ref >59)
Globulin, Total: 3.1 g/dL (ref 1.5–4.5)
Glucose: 137 mg/dL — ABNORMAL HIGH (ref 65–99)
Potassium: 4.7 mmol/L (ref 3.5–5.2)
Sodium: 141 mmol/L (ref 134–144)
Total Protein: 7.2 g/dL (ref 6.0–8.5)

## 2019-06-05 LAB — HGB A1C W/O EAG: Hgb A1c MFr Bld: 8.4 % — ABNORMAL HIGH (ref 4.8–5.6)

## 2019-06-05 LAB — SPECIMEN STATUS REPORT

## 2019-06-12 ENCOUNTER — Ambulatory Visit: Payer: PRIVATE HEALTH INSURANCE | Admitting: "Endocrinology

## 2019-06-12 ENCOUNTER — Encounter: Payer: Self-pay | Admitting: "Endocrinology

## 2019-06-12 ENCOUNTER — Ambulatory Visit (INDEPENDENT_AMBULATORY_CARE_PROVIDER_SITE_OTHER): Payer: PRIVATE HEALTH INSURANCE | Admitting: "Endocrinology

## 2019-06-12 DIAGNOSIS — E1165 Type 2 diabetes mellitus with hyperglycemia: Secondary | ICD-10-CM

## 2019-06-12 DIAGNOSIS — IMO0002 Reserved for concepts with insufficient information to code with codable children: Secondary | ICD-10-CM

## 2019-06-12 DIAGNOSIS — N183 Chronic kidney disease, stage 3 unspecified: Secondary | ICD-10-CM

## 2019-06-12 DIAGNOSIS — E782 Mixed hyperlipidemia: Secondary | ICD-10-CM | POA: Diagnosis not present

## 2019-06-12 DIAGNOSIS — I10 Essential (primary) hypertension: Secondary | ICD-10-CM | POA: Diagnosis not present

## 2019-06-12 DIAGNOSIS — E1122 Type 2 diabetes mellitus with diabetic chronic kidney disease: Secondary | ICD-10-CM

## 2019-06-12 MED ORDER — GLIPIZIDE ER 5 MG PO TB24: 5.0000 mg | ORAL_TABLET | Freq: Two times a day (BID) | ORAL | 2 refills | Status: DC

## 2019-06-12 NOTE — Progress Notes (Signed)
06/12/2019, 2:37 PM                                                     Endocrinology Telehealth Visit Follow up Note -During COVID -19 Pandemic  This visit type was conducted due to national recommendations for restrictions regarding the COVID-19 Pandemic  in an effort to limit this patient's exposure and mitigate transmission of the corona virus.  Due to her co-morbid illnesses, Kendra Hanson is at  moderate to high risk for complications without adequate follow up.  This format is felt to be most appropriate for her at this time.  I connected with this patient on 06/12/2019   by telephone and verified that I am speaking with the correct person using two identifiers. Kendra Hanson, 1961-06-25. she has verbally consented to this visit. All issues noted in this document were discussed and addressed. The format was not optimal for physical exam.   Subjective:    [name], female    DOB: 12-10-1960.  Kendra Hanson is engaged in for follow-up in the management of her currently uncontrolled type 2 diabetes, hypertension, hyperlipidemia.   -PMD:   Fayrene Helper, MD.   Past Medical History:  Diagnosis Date  . Anemia   . Fibroids    Uterine  . GERD (gastroesophageal reflux disease)   . Hyperlipidemia 2008   Lipid profile in 04/2011:136, 53, 43  . Hypertension 2008   Normal CBC and CMet in 2012; negative stress nuclear in 2006- patient asymptomatic  . Insulin dependent diabetes mellitus ([place]) 1996  . Multiple allergies    perennial  . Normocytic normochromic anemia 12/27/2015  . Obesity   . PONV (postoperative nausea and vomiting)   . Sleep apnea    CPAP   Past Surgical History:  Procedure Laterality Date  . COLONOSCOPY N/A 04/05/2015   Procedure: COLONOSCOPY;  Surgeon: Rogene Houston, MD;  Location: [facility];  Service: Endoscopy;  Laterality: N/A;  730  . DENTAL SURGERY    .  ESOPHAGOGASTRODUODENOSCOPY N/A 01/21/2016   Procedure: ESOPHAGOGASTRODUODENOSCOPY (EGD);  Surgeon: Rogene Houston, MD;  Location: [facility];  Service: Endoscopy;  Laterality: N/A;  11:15  . REFRACTIVE SURGERY  2011   Bilateral, two seperate occasions first in 2006  . REMOVAL OF IMPLANT    . RETINAL DETACHMENT SURGERY Bilateral 05/2005   Social History   Socioeconomic History  . Marital status: Married    Spouse name: Not on file  . Number of children: Not on file  . Years of education: Not on file  . Highest education level: Not on file  Occupational History  . Occupation: Employed    Fish farm manager: [employer]  Social Needs  . Financial resource strain: Not on file  . Food insecurity    Worry: Not on file    Inability: Not on file  . Transportation needs    Medical: Not on file    Non-medical: Not on file  Tobacco Use  . Smoking status: Never Smoker  . Smokeless tobacco: Never Used  Substance and Sexual Activity  . Alcohol use: No  .  Drug use: No  . Sexual activity: Yes    Birth control/protection: None, Implant  Lifestyle  . Physical activity    Days per week: Not on file    Minutes per session: Not on file  . Stress: Not on file  Relationships  . Social Herbalist on phone: Not on file    Gets together: Not on file    Attends religious service: Not on file    Active member of club or organization: Not on file    Attends meetings of clubs or organizations: Not on file    Relationship status: Not on file  Other Topics Concern  . Not on file  Social History Narrative   Married with 2 children   Outpatient Encounter Medications as of 06/12/2019  Medication Sig  . amLODipine (NORVASC) 5 MG tablet TAKE 1 TABLET BY MOUTH ONCE DAILY.  Marland Kitchen azelastine (ASTELIN) 0.1 % nasal spray Place 2 sprays into both nostrils 2 (two) times daily. Use in each nostril as directed  . blood glucose meter kit and supplies Three times daily testing dx e11.65 (dispense formulary  preferred Accuchek compact plus)  . blood glucose meter kit and supplies Three times daily testing DX E11.65 Dispense True Metric meter per formulary  . blood glucose meter kit and supplies Dispense based on patient and insurance preference. Use up to four times daily as directed. (FOR ICD-10 E11.65)  . Blood Glucose Monitoring Suppl (ACCU-CHEK GUIDE ME) w/Device KIT 1 Piece by Does not apply route as directed.  . Blood Glucose Monitoring Suppl (FREESTYLE LITE) DEVI   . cetirizine (ZYRTEC) 10 MG tablet Take 10 mg by mouth daily.  . cyclobenzaprine (FLEXERIL) 10 MG tablet Take 1 tablet (10 mg total) by mouth 3 (three) times daily as needed for muscle spasms.  Marland Kitchen diltiazem (TIAZAC) 360 MG 24 hr capsule Take 1 capsule (360 mg total) by mouth daily.  Mariane Baumgarten Calcium (STOOL SOFTENER PO) Take by mouth daily.  . ergocalciferol (VITAMIN D2) 1.25 MG (50000 UT) capsule Take 1 capsule (50,000 Units total) by mouth once a week. One capsule once weekly  . estradiol (VIVELLE-DOT) 0.0375 MG/24HR Place 1 patch onto the skin 3 (three) times a week. Monday, Wednesday, and Saturday.  . ferrous sulfate 325 (65 FE) MG tablet Take 325 mg by mouth daily with breakfast.  . fluticasone (FLONASE) 50 MCG/ACT nasal spray Place 2 sprays into both nostrils daily.  Marland Kitchen glipiZIDE (GLUCOTROL XL) 5 MG 24 hr tablet Take 1 tablet (5 mg total) by mouth 2 (two) times daily with a meal.  . GLOBAL EASE INJECT PEN NEEDLES 31G X 8 MM MISC USE UP TO 5 TIMES A DAY AS DIRECTED.  Marland Kitchen glucose blood (ACCU-CHEK GUIDE) test strip Use as instructed  . insulin aspart protamine - aspart (NOVOLOG MIX 70/30 FLEXPEN) (70-30) 100 UNIT/ML FlexPen Inject 0.4 mLs (40 Units total) into the skin 2 (two) times daily with a meal.  . Lancets (FREESTYLE) lancets Use as instructed three times daily dx e11.65  . metolazone (ZAROXOLYN) 2.5 MG tablet Take 1 tablet (2.5 mg total) by mouth daily.  . montelukast (SINGULAIR) 10 MG tablet Take 1 tablet (10 mg total) by  mouth at bedtime.  . ondansetron (ZOFRAN) 4 MG tablet Take 1 tablet (4 mg total) by mouth 2 (two) times daily as needed for nausea or vomiting.  . pantoprazole (PROTONIX) 20 MG tablet TAKE 1 TABLET ONCE DAILY.  Marland Kitchen potassium chloride (K-DUR) 10 MEQ tablet Take 1  tablet (10 mEq total) by mouth 3 (three) times daily.  . progesterone (PROMETRIUM) 100 MG capsule Take 100 mg by mouth daily.  . Rosuvastatin Calcium 5 MG CPSP Take 5 mg by mouth at bedtime. (Patient not taking: Reported on 04/21/2019)  . Semaglutide, 1 MG/DOSE, (OZEMPIC, 1 MG/DOSE,) 2 MG/1.5ML SOPN Inject 0.5 mg into the skin once a week.  . topiramate (TOPAMAX) 25 MG tablet   . torsemide (DEMADEX) 20 MG tablet Take one tab by mouth (19m) every other day, alternating with two tabs (478m every other day  . UNABLE TO FIND Diabetes shoes x 1 and inserts x 3 Dx:E11.29  . UNABLE TO FIND Compression hose (knee high) 15-2093m pair Dx bilateral leg swelling  . UNABLE TO FIND Diabetic shoes x 1 inserts x 3  Dx E11.9  . vitamin B-12 (CYANOCOBALAMIN) 100 MCG tablet Take 100 mcg by mouth daily.  . [DISCONTINUED] glipiZIDE (GLUCOTROL XL) 5 MG 24 hr tablet Take 1 tablet (5 mg total) by mouth daily with breakfast.   No facility-administered encounter medications on file as of 06/12/2019.     ALLERGIES: Allergies  Allergen Reactions  . Nsaids     Kidney function   . Diclofenac Other (See Comments)    hallucination  . Oxycodone     Vomiting     VACCINATION STATUS: Immunization History  Administered Date(s) Administered  . Influenza Split 08/14/2014  . Influenza,inj,Quad PF,6+ Mos 08/02/2018  . Pneumococcal Conjugate-13 02/01/2015  . Pneumococcal Polysaccharide-23 05/23/2005, 03/28/2016  . Tdap 10/08/2013  . Zoster Recombinat (Shingrix) 10/31/2018, 04/28/2019    Diabetes She presents for her follow-up diabetic visit. She has type 2 diabetes mellitus. Onset time: She was diagnosed at approximate age of [age]. Her disease course  has been improving. Pertinent negatives for hypoglycemia include no confusion, headaches, pallor, seizures or sweats. Pertinent negatives for diabetes include no chest pain, no fatigue, no polydipsia, no polyphagia and no polyuria. There are no hypoglycemic complications. Symptoms are improving. Diabetic complications include nephropathy. Risk factors for coronary artery disease include diabetes mellitus, dyslipidemia, hypertension, obesity and sedentary lifestyle. Current diabetic treatment includes insulin injections. She is following a generally unhealthy diet. When asked about meal planning, she reported none. She has not had a previous visit with a dietitian. She never participates in exercise. Her home blood glucose trend is fluctuating minimally. Her breakfast blood glucose range is generally 140-180 mg/dl. Her bedtime blood glucose range is generally 180-200 mg/dl. Her overall blood glucose range is 180-200 mg/dl. (She reports improved glycemic profile, did not have enough supplies to test 4 times a day.  Her A1c is 8.4% slightly improving from 8.6%.) An ACE inhibitor/angiotensin II receptor blocker is not being taken. Eye exam is current.  Hyperlipidemia This is a chronic problem. The problem is uncontrolled. Exacerbating diseases include diabetes and obesity. Pertinent negatives include no chest pain, myalgias or shortness of breath. Current antihyperlipidemic treatment includes statins. Risk factors for coronary artery disease include dyslipidemia, diabetes mellitus, a sedentary lifestyle, post-menopausal and family history.    Objective:    There were no vitals taken for this visit.  Wt Readings from Last 3 Encounters:  05/12/19 (!) 314 lb (142.4 kg)  04/28/19 (!) 313 lb (142 kg)  04/21/19 (!) 325 lb 1.3 oz (147.5 kg)     CMP     Component Value Date/Time   NA 141 06/04/2019 1059   K 4.7 06/04/2019 1059   CL 100 06/04/2019 1059   CO2 26 06/04/2019 1059  GLUCOSE 137 (H) 06/04/2019  1059   GLUCOSE 190 (H) 10/02/2017 1144   BUN 18 06/04/2019 1059   CREATININE 1.44 (H) 06/04/2019 1059   CREATININE 1.72 (H) 10/02/2017 1144   CALCIUM 9.3 06/04/2019 1059   PROT 7.2 06/04/2019 1059   ALBUMIN 4.1 06/04/2019 1059   AST 12 06/04/2019 1059   ALT 12 06/04/2019 1059   ALKPHOS 114 06/04/2019 1059   BILITOT 0.4 06/04/2019 1059   GFRNONAA 40 (L) 06/04/2019 1059   GFRNONAA 34 (L) 09/11/2017 1612   GFRAA 46 (L) 06/04/2019 1059   GFRAA 40 (L) 09/11/2017 1612     Diabetic Labs (most recent): Lab Results  Component Value Date   HGBA1C 8.4 (H) 06/04/2019   HGBA1C 8.6 (H) 02/19/2019   HGBA1C 7.4 (H) 10/26/2018    Lipid Panel     Component Value Date/Time   CHOL 247 (H) 04/17/2019 1043   TRIG 93 04/17/2019 1043   HDL 52 04/17/2019 1043   CHOLHDL 4.8 (H) 04/17/2019 1043   CHOLHDL 2.9 04/12/2017 1121   VLDL 15 04/12/2017 1121   LDLCALC 176 (H) 04/17/2019 1043        Assessment & Plan:   1. Uncontrolled type 2 diabetes mellitus with stage 3 chronic kidney disease ([place])  - Kendra Hanson has currently uncontrolled symptomatic type 2 DM since 58 years of age. -She is currently on premixed insulin NovoLog 70/30, reports improved glycemic profile.  Her previsit A1c was 8.4%.    -her diabetes is complicated by stage 3 renal insufficiency and she remains at a high risk for more acute and chronic complications which include CAD, CVA, CKD, retinopathy, and neuropathy. These are all discussed in detail with her.  - I have counseled her on diet management and weight loss, by adopting a carbohydrate restricted/protein rich diet.  - she  admits there is a room for improvement in her diet and drink choices. -  Suggestion is made for her to avoid simple carbohydrates  from her diet including Cakes, Sweet Desserts / Pastries, Ice Cream, Soda (diet and regular), Sweet Tea, Candies, Chips, Cookies, Sweet Pastries,  Store Bought Juices, Alcohol in Excess of  1-2 drinks a day,  Artificial Sweeteners, Coffee Creamer, and "Sugar-free" Products. This will help patient to have stable blood glucose profile and potentially avoid unintended weight gain.   - I encouraged her to switch to  unprocessed or minimally processed complex starch and increased protein intake (animal or plant source), fruits, and vegetables.  - she is advised to stick to a routine mealtimes to eat 3 meals  a day and avoid unnecessary snacks ( to snack only to correct hypoglycemia).    - I have approached her with the following individualized plan to manage diabetes and patient agrees:    -She wishes to stay on her premixed insulin. -She is advised to continue NovoLog 70/30 40 units with breakfast and 40 units with supper, associated with strict monitoring of blood glucose 4 times a day-before meals and at bedtime return with her meter and logs and repeat labs in 4 weeks.   -  She will continue Ozempic 0.5 mg subcutaneously weekly.  Advised to increase her glipizide 5 mg p.o. with breakfast and 5 mg p.o. with supper.  - she is encouraged to call clinic for fasting blood glucose levels less than 70 or above 200 mg /dl.   - she is not a candidate for metformin, SGLT2 inhibitors due to concurrent renal insufficiency.  - Patient specific  target  A1c;  LDL, HDL, Triglycerides, and  Waist Circumference were discussed in detail.  2) Blood Pressure /Hypertension: she is advised to home monitor blood pressure and report if > 140/90 on 2 separate readings.    she is advised to continue her current medications including diltiazem 360 mg p.o. daily with breakfast .  3) Lipids/Hyperlipidemia:   Review of her recent lipid panel showed  uncontrolled  LDL at 187.  She did not tolerate pravastatin.  She was initiated on low-dose Crestor, 5 mg p.o. daily.   This medication will be advanced if tolerated, if not, she will be considered for Repatha on subsequent visits.     - she is  advised to maintain close follow  up with Fayrene Helper, MD for primary care needs, as well as her other providers for optimal and coordinated care.  - Patient Care Time Today:  25 min, of which >50% was spent in  counseling and the rest reviewing her  current and  previous labs/studies, previous treatments, her blood glucose readings, and medications' doses and developing a plan for long-term care based on the latest recommendations for standards of care.   Netta Cedars participated in the discussions, expressed understanding, and voiced agreement with the above plans.  All questions were answered to her satisfaction. she is encouraged to contact clinic should she have any questions or concerns prior to her return visit.  Follow up plan: - Return in about 3 months (around 09/12/2019) for Follow up with Pre-visit Labs, Meter, and Logs.  Glade Lloyd, MD [facility] [address] [place],  [address] Phone: [phone]  Fax: [phone]    06/12/2019, 2:37 PM  This note was partially dictated with voice recognition software. Similar sounding words can be transcribed inadequately or may not  be corrected upon review.

## 2019-06-16 ENCOUNTER — Encounter: Payer: Self-pay | Admitting: Family Medicine

## 2019-06-16 ENCOUNTER — Other Ambulatory Visit: Payer: Self-pay

## 2019-06-16 ENCOUNTER — Telehealth: Payer: Self-pay

## 2019-06-16 ENCOUNTER — Ambulatory Visit (INDEPENDENT_AMBULATORY_CARE_PROVIDER_SITE_OTHER): Payer: Medicare Other | Admitting: Family Medicine

## 2019-06-16 VITALS — BP 117/69 | HR 83 | Resp 12 | Ht 65.0 in | Wt 313.0 lb

## 2019-06-16 DIAGNOSIS — N183 Chronic kidney disease, stage 3 unspecified: Secondary | ICD-10-CM

## 2019-06-16 DIAGNOSIS — IMO0002 Reserved for concepts with insufficient information to code with codable children: Secondary | ICD-10-CM

## 2019-06-16 DIAGNOSIS — I10 Essential (primary) hypertension: Secondary | ICD-10-CM

## 2019-06-16 DIAGNOSIS — E782 Mixed hyperlipidemia: Secondary | ICD-10-CM

## 2019-06-16 DIAGNOSIS — R600 Localized edema: Secondary | ICD-10-CM

## 2019-06-16 DIAGNOSIS — E1122 Type 2 diabetes mellitus with diabetic chronic kidney disease: Secondary | ICD-10-CM | POA: Diagnosis not present

## 2019-06-16 DIAGNOSIS — E1165 Type 2 diabetes mellitus with hyperglycemia: Secondary | ICD-10-CM

## 2019-06-16 MED ORDER — ACCU-CHEK GUIDE ME W/DEVICE KIT: 1.0000 | PACK | 0 refills | Status: DC

## 2019-06-16 MED ORDER — GLIPIZIDE ER 5 MG PO TB24: 5.0000 mg | ORAL_TABLET | Freq: Two times a day (BID) | ORAL | 2 refills | Status: DC

## 2019-06-16 NOTE — Telephone Encounter (Signed)
Kendra Hanson, CMA  

## 2019-06-16 NOTE — Patient Instructions (Addendum)
F/U with MD in November, call if you need me before  BP is excellent off of amlodipine, so do not take this anymore  Leg swelling and weight are down with current regime that  your kidney Dr has you on, continue same  Please get fasting lipid and hepatic panelat the same time you are getting labs for Dr Dorris Fetch at the end of October  Thanks for choosing [facility], we consider it a privelige to serve you.   It is important that you exercise regularly at least 30 minutes 5 times a week. If you develop chest pain, have severe difficulty breathing, or feel very tired, stop exercising immediately and seek medical attention  Think about what you will eat, plan ahead. Choose " clean, green, fresh or frozen" over canned, processed or packaged foods which are more sugary, salty and fatty. 70 to 75% of food eaten should be vegetables and fruit. Three meals at set times with snacks allowed between meals, but they must be fruit or vegetables. Aim to eat over a 12 hour period , example 7 am to 7 pm, and STOP after  your last meal of the day. Drink water,generally about 64 ounces per day, no other drink is as healthy. Fruit juice is best enjoyed in a healthy way, by EATING the fruit. Social distancing. Frequent hand washing with soap and water Keeping your hands off of your face. These 3 practices will help to keep both you and your community healthy during this time. Please practice them faithfully!

## 2019-06-18 ENCOUNTER — Encounter: Payer: Self-pay | Admitting: Family Medicine

## 2019-06-19 ENCOUNTER — Telehealth: Payer: Self-pay | Admitting: Family Medicine

## 2019-06-19 ENCOUNTER — Telehealth: Payer: Self-pay

## 2019-06-19 ENCOUNTER — Other Ambulatory Visit: Payer: Self-pay | Admitting: "Endocrinology

## 2019-06-19 ENCOUNTER — Other Ambulatory Visit: Payer: Self-pay

## 2019-06-19 MED ORDER — DILTIAZEM HCL ER BEADS 360 MG PO CP24: 360.0000 mg | ORAL_CAPSULE | Freq: Every day | ORAL | 0 refills | Status: DC

## 2019-06-19 MED ORDER — POTASSIUM CHLORIDE ER 10 MEQ PO TBCR: 10.0000 meq | EXTENDED_RELEASE_TABLET | Freq: Three times a day (TID) | ORAL | 0 refills | Status: DC

## 2019-06-19 MED ORDER — HUMULIN 70/30 KWIKPEN (70-30) 100 UNIT/ML ~~LOC~~ SUPN: 40.0000 [IU] | PEN_INJECTOR | Freq: Two times a day (BID) | SUBCUTANEOUS | 2 refills | Status: DC

## 2019-06-19 NOTE — Telephone Encounter (Signed)
I can change her NovoLog to Humulin 70/30.  She can finish her Ozempic and stop.

## 2019-06-19 NOTE — Telephone Encounter (Signed)
LTD Claim  Copied Sleeved  Noted

## 2019-06-19 NOTE — Telephone Encounter (Signed)
Pt states that her Novolog 70/30 and Ozempic would be over $300. She is asking if we can change this. Insurance does not cover Victoza

## 2019-06-20 ENCOUNTER — Other Ambulatory Visit: Payer: Self-pay

## 2019-06-20 ENCOUNTER — Telehealth: Payer: Self-pay

## 2019-06-20 MED ORDER — HUMULIN 70/30 KWIKPEN (70-30) 100 UNIT/ML ~~LOC~~ SUPN: 40.0000 [IU] | PEN_INJECTOR | Freq: Two times a day (BID) | SUBCUTANEOUS | 0 refills | Status: DC

## 2019-06-20 NOTE — Telephone Encounter (Signed)
Sent msg through mychart ?

## 2019-06-20 NOTE — Telephone Encounter (Signed)
Left msg for pt to return call.

## 2019-06-20 NOTE — Telephone Encounter (Signed)
What dosage do you want her on?

## 2019-06-20 NOTE — Telephone Encounter (Signed)
Pt.notified

## 2019-06-20 NOTE — Telephone Encounter (Signed)
40 units BID, rx sent already.

## 2019-06-22 ENCOUNTER — Encounter: Payer: Self-pay | Admitting: Family Medicine

## 2019-06-22 NOTE — Progress Notes (Signed)
Kendra Hanson     MRN: [id number]      DOB: 1960/11/15   HPI Kendra Hanson is here for follow up and re-evaluation of chronic medical conditions, medication management and review of any available recent lab and radiology data.  Preventive health is updated, specifically  Cancer screening and Immunization.   Questions or concerns regarding consultations or procedures which the PT has had in the interim are  addressed. The PT noted swelling on amlodipine, this has been discontinued and regime changed by her Nephrologist , with excellent BP control Blood sugar remains higher than goal, she states will be more consistent with diet  Denies polyuria, polydipsia, blurred vision , or hypoglycemic episodes.   ROS Denies recent fever or chills. Denies sinus pressure, nasal congestion, ear pain or sore throat. Denies chest congestion, productive cough or wheezing. Denies chest pains, palpitations , PND or orthopnea Denies abdominal pain, nausea, vomiting,diarrhea or constipation.   Denies dysuria, frequency, hesitancy or incontinence. C/o  joint pain, swelling and limitation in mobility. Denies headaches, seizures, numbness, or tingling. Denies depression, anxiety or insomnia. Denies skin break down or rash.   PE  BP 117/69   Pulse 83   Resp 12   Ht 5\' 5"  (1.651 m)   Wt (!) 313 lb (142 kg)   SpO2 96%   BMI 52.09 kg/m   Patient alert and oriented and in no cardiopulmonary distress.  HEENT: No facial asymmetry, EOMI,   oropharynx pink and moist.  Neck supple no JVD, no mass.  Chest: Clear to auscultation bilaterally.  CVS: S1, S2 no murmurs, no S3.Regular rate.  ABD: Soft non tender.   Ext: No edema  MS: Adequate ROM spine, shoulders, hips and knees.  Skin: Intact, no ulcerations or rash noted.  Psych: Good eye contact, normal affect. Memory intact not anxious or depressed appearing.  CNS: CN 2-12 intact, power,  normal throughout.no focal deficits noted.   Assessment &  Plan  Essential hypertension, benign Controlled, no change in medication DASH diet and commitment to daily physical activity for a minimum of 30 minutes discussed and encouraged, as a part of hypertension management. The importance of attaining a healthy weight is also discussed.  BP/Weight 06/16/2019 05/12/2019 04/28/2019 04/21/2019 01/22/2019 12/26/2018 74/06/1447  Systolic BP 185 631 - 497 026 378 588  Diastolic BP 69 67 - 66 86 70 78  Wt. (Lbs) 313 314 313 325.08 325.08 321.6 324  BMI 52.09 52.25 52.09 54.1 54.1 51.91 52.29       Uncontrolled type 2 diabetes mellitus with stage 3 chronic kidney disease (HCC) Uncontrolled, managed by Endo Kendra Hanson is reminded of the importance of commitment to daily physical activity for 30 minutes or more, as able and the need to limit carbohydrate intake to 30 to 60 grams per meal to help with blood sugar control.   The need to take medication as prescribed, test blood sugar as directed, and to call between visits if there is a concern that blood sugar is uncontrolled is also discussed.   Kendra Hanson is reminded of the importance of daily foot exam, annual eye examination, and good blood sugar, blood pressure and cholesterol control.  Diabetic Labs Latest Ref Rng & Units 06/04/2019 04/17/2019 02/19/2019 10/26/2018 06/26/2018  HbA1c 4.8 - 5.6 % 8.4(H) - 8.6(H) 7.4(H) 7.1(H)  Microalbumin Not Estab. ug/mL - - - - -  Micro/Creat Ratio 0.0 - 30.0 mg/g creat - - - - -  Chol 100 - 199 mg/dL -  247(H) 263(H) 175 -  HDL >39 mg/dL - 52 54 53 -  Calc LDL 0 - 99 mg/dL - 176(H) 187(H) 105(H) -  Triglycerides 0 - 149 mg/dL - 93 108 87 -  Creatinine 0.57 - 1.00 mg/dL 1.44(H) 1.56(H) 1.48(H) 1.40(H) 1.49(H)   BP/Weight 06/16/2019 05/12/2019 04/28/2019 04/21/2019 01/22/2019 12/26/2018 94/76/5465  Systolic BP 035 465 - 681 275 170 017  Diastolic BP 69 67 - 66 86 70 78  Wt. (Lbs) 313 314 313 325.08 325.08 321.6 324  BMI 52.09 52.25 52.09 54.1 54.1 51.91 52.29   Foot/eye  exam completion dates 06/25/2018 04/23/2017  Foot Form Completion Done Done        Morbid obesity  Patient re-educated about  the importance of commitment to a  minimum of 150 minutes of exercise per week as able.  The importance of healthy food choices with portion control discussed, as well as eating regularly and within a 12 hour window most days. The need to choose "clean , green" food 50 to 75% of the time is discussed, as well as to make water the primary drink and set a goal of 64 ounces water daily.    Weight /BMI 06/16/2019 05/12/2019 04/28/2019  WEIGHT 313 lb 314 lb 313 lb  HEIGHT 5\' 5"  5\' 5"  5\' 5"   BMI 52.09 kg/m2 52.25 kg/m2 52.09 kg/m2      Mixed hyperlipidemia Hyperlipidemia:Low fat diet discussed and encouraged.   Lipid Panel  Lab Results  Component Value Date   CHOL 247 (H) 04/17/2019   HDL 52 04/17/2019   LDLCALC 176 (H) 04/17/2019   TRIG 93 04/17/2019   CHOLHDL 4.8 (H) 04/17/2019   Uncontrolled, not at goal Updated lab needed at/ before next visit.     Lower extremity edema Marked improvement on current regime

## 2019-06-22 NOTE — Assessment & Plan Note (Signed)
Controlled, no change in medication DASH diet and commitment to daily physical activity for a minimum of 30 minutes discussed and encouraged, as a part of hypertension management. The importance of attaining a healthy weight is also discussed.  BP/Weight 06/16/2019 05/12/2019 04/28/2019 04/21/2019 01/22/2019 12/26/2018 54/49/2010  Systolic BP 071 219 - 758 832 549 826  Diastolic BP 69 67 - 66 86 70 78  Wt. (Lbs) 313 314 313 325.08 325.08 321.6 324  BMI 52.09 52.25 52.09 54.1 54.1 51.91 52.29

## 2019-06-22 NOTE — Assessment & Plan Note (Signed)
  Patient re-educated about  the importance of commitment to a  minimum of 150 minutes of exercise per week as able.  The importance of healthy food choices with portion control discussed, as well as eating regularly and within a 12 hour window most days. The need to choose "clean , green" food 50 to 75% of the time is discussed, as well as to make water the primary drink and set a goal of 64 ounces water daily.    Weight /BMI 06/16/2019 05/12/2019 04/28/2019  WEIGHT 313 lb 314 lb 313 lb  HEIGHT 5\' 5"  5\' 5"  5\' 5"   BMI 52.09 kg/m2 52.25 kg/m2 52.09 kg/m2

## 2019-06-22 NOTE — Assessment & Plan Note (Signed)
Hyperlipidemia:Low fat diet discussed and encouraged.   Lipid Panel  Lab Results  Component Value Date   CHOL 247 (H) 04/17/2019   HDL 52 04/17/2019   LDLCALC 176 (H) 04/17/2019   TRIG 93 04/17/2019   CHOLHDL 4.8 (H) 04/17/2019   Uncontrolled, not at goal Updated lab needed at/ before next visit.

## 2019-06-22 NOTE — Assessment & Plan Note (Signed)
Marked improvement on current regime

## 2019-06-22 NOTE — Assessment & Plan Note (Signed)
Uncontrolled, managed by Endo Kendra Hanson is reminded of the importance of commitment to daily physical activity for 30 minutes or more, as able and the need to limit carbohydrate intake to 30 to 60 grams per meal to help with blood sugar control.   The need to take medication as prescribed, test blood sugar as directed, and to call between visits if there is a concern that blood sugar is uncontrolled is also discussed.   Kendra Hanson is reminded of the importance of daily foot exam, annual eye examination, and good blood sugar, blood pressure and cholesterol control.  Diabetic Labs Latest Ref Rng & Units 06/04/2019 04/17/2019 02/19/2019 10/26/2018 06/26/2018  HbA1c 4.8 - 5.6 % 8.4(H) - 8.6(H) 7.4(H) 7.1(H)  Microalbumin Not Estab. ug/mL - - - - -  Micro/Creat Ratio 0.0 - 30.0 mg/g creat - - - - -  Chol 100 - 199 mg/dL - 247(H) 263(H) 175 -  HDL >39 mg/dL - 52 54 53 -  Calc LDL 0 - 99 mg/dL - 176(H) 187(H) 105(H) -  Triglycerides 0 - 149 mg/dL - 93 108 87 -  Creatinine 0.57 - 1.00 mg/dL 1.44(H) 1.56(H) 1.48(H) 1.40(H) 1.49(H)   BP/Weight 06/16/2019 05/12/2019 04/28/2019 04/21/2019 01/22/2019 12/26/2018 21/97/5883  Systolic BP 254 982 - 641 583 094 076  Diastolic BP 69 67 - 66 86 70 78  Wt. (Lbs) 313 314 313 325.08 325.08 321.6 324  BMI 52.09 52.25 52.09 54.1 54.1 51.91 52.29   Foot/eye exam completion dates 06/25/2018 04/23/2017  Foot Form Completion Done Done

## 2019-06-23 ENCOUNTER — Other Ambulatory Visit: Payer: Self-pay

## 2019-06-23 DIAGNOSIS — IMO0002 Reserved for concepts with insufficient information to code with codable children: Secondary | ICD-10-CM

## 2019-06-23 DIAGNOSIS — E1122 Type 2 diabetes mellitus with diabetic chronic kidney disease: Secondary | ICD-10-CM

## 2019-06-23 DIAGNOSIS — N183 Chronic kidney disease, stage 3 unspecified: Secondary | ICD-10-CM

## 2019-06-23 NOTE — Telephone Encounter (Signed)
Pt notified this was sent

## 2019-06-24 ENCOUNTER — Other Ambulatory Visit: Payer: Self-pay

## 2019-06-24 DIAGNOSIS — I739 Peripheral vascular disease, unspecified: Secondary | ICD-10-CM

## 2019-06-27 ENCOUNTER — Encounter: Payer: Self-pay | Admitting: Family Medicine

## 2019-06-30 ENCOUNTER — Ambulatory Visit: Payer: Commercial Managed Care - PPO | Admitting: "Endocrinology

## 2019-07-01 ENCOUNTER — Encounter: Payer: Self-pay | Admitting: Family Medicine

## 2019-07-01 ENCOUNTER — Telehealth (HOSPITAL_COMMUNITY): Payer: Self-pay | Admitting: Rehabilitation

## 2019-07-01 ENCOUNTER — Telehealth: Payer: Self-pay | Admitting: Family Medicine

## 2019-07-01 NOTE — Telephone Encounter (Signed)

## 2019-07-01 NOTE — Telephone Encounter (Signed)
Call placed to verify with pt her ability to use hands and upper extremities for form completion. All questions were answered and form completed as best able. She has had carpal tunnel and left elbow surgery and has C spine disease

## 2019-07-02 ENCOUNTER — Ambulatory Visit (INDEPENDENT_AMBULATORY_CARE_PROVIDER_SITE_OTHER): Payer: Medicare Other | Admitting: Vascular Surgery

## 2019-07-02 ENCOUNTER — Other Ambulatory Visit: Payer: Self-pay

## 2019-07-02 ENCOUNTER — Encounter: Payer: Self-pay | Admitting: Vascular Surgery

## 2019-07-02 ENCOUNTER — Ambulatory Visit (HOSPITAL_COMMUNITY)
Admission: RE | Admit: 2019-07-02 | Discharge: 2019-07-02 | Disposition: A | Discharge status: Home / Self Care | Payer: Medicare Other | Source: Ambulatory Visit | Attending: Vascular Surgery | Admitting: Vascular Surgery

## 2019-07-02 VITALS — BP 135/75 | HR 84 | Temp 98.1°F | Resp 14 | Ht 65.0 in | Wt 313.0 lb

## 2019-07-02 DIAGNOSIS — I890 Lymphedema, not elsewhere classified: Secondary | ICD-10-CM

## 2019-07-02 DIAGNOSIS — N183 Chronic kidney disease, stage 3 unspecified: Secondary | ICD-10-CM

## 2019-07-02 DIAGNOSIS — IMO0002 Reserved for concepts with insufficient information to code with codable children: Secondary | ICD-10-CM

## 2019-07-02 DIAGNOSIS — I739 Peripheral vascular disease, unspecified: Secondary | ICD-10-CM | POA: Insufficient documentation

## 2019-07-02 DIAGNOSIS — I872 Venous insufficiency (chronic) (peripheral): Secondary | ICD-10-CM

## 2019-07-02 DIAGNOSIS — E1122 Type 2 diabetes mellitus with diabetic chronic kidney disease: Secondary | ICD-10-CM

## 2019-07-02 DIAGNOSIS — I10 Essential (primary) hypertension: Secondary | ICD-10-CM

## 2019-07-02 MED ORDER — GLIPIZIDE ER 5 MG PO TB24: 5.0000 mg | ORAL_TABLET | Freq: Two times a day (BID) | ORAL | 0 refills | Status: DC

## 2019-07-02 NOTE — Progress Notes (Signed)
REASON FOR CONSULT:    LOWER EXTREMITY EDEMA AND PERIPHERAL VASCULAR DISEASE: The consult is requested by Dr. Tula Nakayama.  ASSESSMENT & PLAN:   LEG SWELLING: Patient does have some mild bilateral lower extremity swelling.  Based on her exam I think she may have some underlying lymphedema.  She may also have some chronic venous insufficiency given that she has some hyperpigmentation in both legs around the ankle.  We have discussed conservative treatment for this.  Specifically we discussed the importance of leg elevation and the proper positioning for this.  I have written her a prescription for knee-high compression stockings with a gradient of 15 to 20 mmHg.  I think these will be easier for her to get on and are more practical.  I encouraged her to avoid prolonged sitting and standing.  We discussed the importance of exercise specifically walking and water aerobics.  We also discussed the importance of nutrition and weight management.  If her swelling progresses then we could obtain formal venous reflux studies however at this point I would start with these conservative measures.  I did reassure her that she has no evidence of arterial insufficiency.  Deitra Mayo, MD, FACS Beeper [phone] Office: [phone]   HPI:   Kendra Hanson is a pleasant 58 y.o. female, who noted the gradual onset of bilateral lower extremity swelling back in June of this year.  This was gradually getting worse and more recently she was started on diuretics which did help her swelling some.  She is unaware of any previous history of DVT or phlebitis.  She is on disability now but previously spent a lot of time standing at work which certainly contributed to her swelling.  She does try to elevate her legs some which does help.  She has not done well with compression stockings as she has a hard time getting these on.  Her risk factors for peripheral vascular disease include diabetes, hypertension,  hypercholesterolemia, and a family history of premature cardiovascular disease.  She is not a smoker.  She is on aspirin and is on a statin.  She is had no previous abdominal surgery inguinal surgery or radiation therapy to suggest secondary lymphedema.  Past Medical History:  Diagnosis Date  . Anemia   . Fibroids    Uterine  . GERD (gastroesophageal reflux disease)   . Hyperlipidemia 2008   Lipid profile in 04/2011:136, 53, 43  . Hypertension 2008   Normal CBC and CMet in 2012; negative stress nuclear in 2006- patient asymptomatic  . Insulin dependent diabetes mellitus ([address]) 1996  . Multiple allergies    perennial  . Normocytic normochromic anemia 12/27/2015  . Obesity   . PONV (postoperative nausea and vomiting)   . Sleep apnea    CPAP    Family History  Problem Relation Age of Onset  . Hypertension Mother   . Diabetes Mother   . Kidney failure Mother   . Stroke Mother   . Kidney failure Father   . Diabetes Father   . Heart attack Father   . COPD Sister   . Diabetes Brother   . Diabetes Brother   . Diabetes Brother   . Diabetes Brother   . Diabetes Brother   . Hypertension Brother     SOCIAL HISTORY: Social History   Socioeconomic History  . Marital status: Married    Spouse name: Not on file  . Number of children: Not on file  . Years of education: Not on file  .  Highest education level: Not on file  Occupational History  . Occupation: Employed    Fish farm manager: [employer]  Social Needs  . Financial resource strain: Not on file  . Food insecurity    Worry: Not on file    Inability: Not on file  . Transportation needs    Medical: Not on file    Non-medical: Not on file  Tobacco Use  . Smoking status: Never Smoker  . Smokeless tobacco: Never Used  Substance and Sexual Activity  . Alcohol use: No  . Drug use: No  . Sexual activity: Yes    Birth control/protection: None, Implant  Lifestyle  . Physical activity    Days per week: Not on file     Minutes per session: Not on file  . Stress: Not on file  Relationships  . Social Herbalist on phone: Not on file    Gets together: Not on file    Attends religious service: Not on file    Active member of club or organization: Not on file    Attends meetings of clubs or organizations: Not on file    Relationship status: Not on file  . Intimate partner violence    Fear of current or ex partner: Not on file    Emotionally abused: Not on file    Physically abused: Not on file    Forced sexual activity: Not on file  Other Topics Concern  . Not on file  Social History Narrative   Married with 2 children    Allergies  Allergen Reactions  . Nsaids     Kidney function   . Diclofenac Other (See Comments)    hallucination  . Oxycodone     Vomiting     Current Outpatient Medications  Medication Sig Dispense Refill  . azelastine (ASTELIN) 0.1 % nasal spray Place 2 sprays into both nostrils 2 (two) times daily. Use in each nostril as directed 30 mL 0  . blood glucose meter kit and supplies Three times daily testing dx e11.65 (dispense formulary preferred Accuchek compact plus) 1 each 0  . blood glucose meter kit and supplies Three times daily testing DX E11.65 Dispense True Metric meter per formulary 1 each 0  . blood glucose meter kit and supplies Dispense based on patient and insurance preference. Use up to four times daily as directed. (FOR ICD-10 E11.65) 1 each 0  . Blood Glucose Monitoring Suppl (ACCU-CHEK GUIDE ME) w/Device KIT 1 Piece by Does not apply route as directed. 1 kit 0  . Blood Glucose Monitoring Suppl (FREESTYLE LITE) DEVI   0  . cetirizine (ZYRTEC) 10 MG tablet Take 10 mg by mouth daily.    . cyclobenzaprine (FLEXERIL) 10 MG tablet Take 1 tablet (10 mg total) by mouth 3 (three) times daily as needed for muscle spasms. 90 tablet 0  . diltiazem (TIAZAC) 360 MG 24 hr capsule Take 1 capsule (360 mg total) by mouth daily. 90 capsule 0  . Docusate Calcium (STOOL  SOFTENER PO) Take by mouth daily.    . ergocalciferol (VITAMIN D2) 1.25 MG (50000 UT) capsule Take 1 capsule (50,000 Units total) by mouth once a week. One capsule once weekly 12 capsule 0  . estradiol (VIVELLE-DOT) 0.0375 MG/24HR Place 1 patch onto the skin 3 (three) times a week. Monday, Wednesday, and Saturday.  12  . ferrous sulfate 325 (65 FE) MG tablet Take 325 mg by mouth daily with breakfast.    . fluticasone (FLONASE) 50  MCG/ACT nasal spray Place 2 sprays into both nostrils daily. 16 g 0  . glipiZIDE (GLUCOTROL XL) 5 MG 24 hr tablet Take 1 tablet (5 mg total) by mouth 2 (two) times daily with a meal. 180 tablet 0  . GLOBAL EASE INJECT PEN NEEDLES 31G X 8 MM MISC USE UP TO 5 TIMES A DAY AS DIRECTED. 150 each PRN  . glucose blood (ACCU-CHEK GUIDE) test strip Use as instructed 150 each 2  . Insulin Isophane & Regular Human (HUMULIN 70/30 KWIKPEN) (70-30) 100 UNIT/ML PEN Inject 40 Units into the skin 2 (two) times daily before a meal. 60 mL 0  . Lancets (FREESTYLE) lancets Use as instructed three times daily dx e11.65 100 each 12  . metolazone (ZAROXOLYN) 2.5 MG tablet Take 1 tablet (2.5 mg total) by mouth daily. 7 tablet 0  . montelukast (SINGULAIR) 10 MG tablet Take 1 tablet (10 mg total) by mouth at bedtime. 90 tablet 0  . ondansetron (ZOFRAN) 4 MG tablet Take 1 tablet (4 mg total) by mouth 2 (two) times daily as needed for nausea or vomiting. 30 tablet 0  . pantoprazole (PROTONIX) 20 MG tablet TAKE 1 TABLET ONCE DAILY. 90 tablet 0  . potassium chloride (K-DUR) 10 MEQ tablet Take 1 tablet (10 mEq total) by mouth 3 (three) times daily. 270 tablet 0  . progesterone (PROMETRIUM) 100 MG capsule Take 100 mg by mouth daily.    . Rosuvastatin Calcium 5 MG CPSP Take 5 mg by mouth at bedtime. 30 capsule 3  . Semaglutide, 1 MG/DOSE, (OZEMPIC, 1 MG/DOSE,) 2 MG/1.5ML SOPN Inject 0.5 mg into the skin once a week. 1.5 mL 2  . torsemide (DEMADEX) 20 MG tablet Take one tab by mouth (50m) every other day,  alternating with two tabs (454m every other day 135 tablet 3  . UNABLE TO FIND Diabetes shoes x 1 and inserts x 3 Dx:E11.29 1 each 0  . UNABLE TO FIND Compression hose (knee high) 15-2010m pair Dx bilateral leg swelling 1 each 0  . UNABLE TO FIND Diabetic shoes x 1 inserts x 3  Dx E11.9 1 each 0  . vitamin B-12 (CYANOCOBALAMIN) 100 MCG tablet Take 100 mcg by mouth daily.     No current facility-administered medications for this visit.     REVIEW OF SYSTEMS:  [X]  denotes positive finding, [ ]  denotes negative finding Cardiac  Comments:  Chest pain or chest pressure:    Shortness of breath upon exertion:    Short of breath when lying flat:    Irregular heart rhythm:        Vascular    Pain in calf, thigh, or hip brought on by ambulation:    Pain in feet at night that wakes you up from your sleep:     Blood clot in your veins:    Leg swelling:  x       Pulmonary    Oxygen at home:    Productive cough:     Wheezing:         Neurologic    Sudden weakness in arms or legs:     Sudden numbness in arms or legs:     Sudden onset of difficulty speaking or slurred speech:    Temporary loss of vision in one eye:     Problems with dizziness:         Gastrointestinal    Blood in stool:     Vomited blood:  Genitourinary    Burning when urinating:     Blood in urine:        Psychiatric    Major depression:         Hematologic    Bleeding problems:    Problems with blood clotting too easily:        Skin    Rashes or ulcers:        Constitutional    Fever or chills:     PHYSICAL EXAM:   Vitals:   07/02/19 0925  BP: 135/75  Pulse: 84  Resp: 14  Temp: 98.1 F (36.7 C)  TempSrc: Temporal  SpO2: 97%  Weight: (!) 313 lb (142 kg)  Height: 5' 5"  (1.651 m)    GENERAL: The patient is a well-nourished female, in no acute distress. The vital signs are documented above. CARDIAC: There is a regular rate and rhythm.  VASCULAR: I do not detect carotid bruits. She  has palpable dorsalis pedis pulses bilaterally. She has bilateral lower extremity swelling with some hyperpigmentation around both ankles as documented below.    PULMONARY: There is good air exchange bilaterally without wheezing or rales. ABDOMEN: Soft and non-tender with normal pitched bowel sounds.  MUSCULOSKELETAL: There are no major deformities or cyanosis. NEUROLOGIC: No focal weakness or paresthesias are detected. SKIN: There are no ulcers or rashes noted. PSYCHIATRIC: The patient has a normal affect.  DATA:    ARTERIAL DOPPLER STUDY: I have independently interpreted the arterial Doppler study that was done today.  On the right side there is a biphasic posterior tibial signal with a triphasic dorsalis pedis signal.  ABI is 90%.  Toe pressure on the right is 141 mmHg.  On the left side there is a triphasic dorsalis pedis and posterior tibial signal.  ABI is 90%.  Toe pressure is 134 mmHg.  LABS: I have reviewed the labs from 06/04/2019.  Creatinine 1.44.  GFR is 46.

## 2019-07-08 ENCOUNTER — Other Ambulatory Visit: Payer: Self-pay | Admitting: "Endocrinology

## 2019-07-08 DIAGNOSIS — E1122 Type 2 diabetes mellitus with diabetic chronic kidney disease: Secondary | ICD-10-CM

## 2019-07-08 DIAGNOSIS — IMO0002 Reserved for concepts with insufficient information to code with codable children: Secondary | ICD-10-CM

## 2019-07-08 DIAGNOSIS — N183 Chronic kidney disease, stage 3 unspecified: Secondary | ICD-10-CM

## 2019-07-09 ENCOUNTER — Other Ambulatory Visit: Payer: Self-pay

## 2019-07-09 MED ORDER — DILTIAZEM HCL ER BEADS 360 MG PO CP24: 360.0000 mg | ORAL_CAPSULE | Freq: Every day | ORAL | 0 refills | Status: DC

## 2019-07-09 MED ORDER — POTASSIUM CHLORIDE ER 10 MEQ PO TBCR: 10.0000 meq | EXTENDED_RELEASE_TABLET | Freq: Three times a day (TID) | ORAL | 0 refills | Status: DC

## 2019-07-14 ENCOUNTER — Other Ambulatory Visit: Payer: Self-pay

## 2019-07-14 ENCOUNTER — Ambulatory Visit (INDEPENDENT_AMBULATORY_CARE_PROVIDER_SITE_OTHER): Payer: Medicare Other

## 2019-07-14 DIAGNOSIS — Z23 Encounter for immunization: Secondary | ICD-10-CM

## 2019-07-17 ENCOUNTER — Encounter: Payer: Self-pay | Admitting: Family Medicine

## 2019-07-17 ENCOUNTER — Other Ambulatory Visit: Payer: Self-pay | Admitting: Family Medicine

## 2019-07-22 ENCOUNTER — Other Ambulatory Visit: Payer: Self-pay | Admitting: Family Medicine

## 2019-07-22 MED ORDER — BENZONATATE 100 MG PO CAPS: 100.0000 mg | ORAL_CAPSULE | Freq: Two times a day (BID) | ORAL | 1 refills | Status: DC | PRN

## 2019-07-22 NOTE — Telephone Encounter (Signed)
Patient requesting tessalon perles

## 2019-07-22 NOTE — Progress Notes (Signed)
Tessalon perle

## 2019-08-01 ENCOUNTER — Other Ambulatory Visit: Payer: Self-pay | Admitting: "Endocrinology

## 2019-08-04 ENCOUNTER — Other Ambulatory Visit: Payer: Self-pay | Admitting: "Endocrinology

## 2019-08-11 LAB — HM DIABETES EYE EXAM

## 2019-09-01 ENCOUNTER — Other Ambulatory Visit: Payer: Self-pay | Admitting: "Endocrinology

## 2019-09-02 ENCOUNTER — Encounter: Payer: Self-pay | Admitting: Family Medicine

## 2019-09-02 ENCOUNTER — Other Ambulatory Visit: Payer: Self-pay | Admitting: Family Medicine

## 2019-09-02 DIAGNOSIS — E1122 Type 2 diabetes mellitus with diabetic chronic kidney disease: Secondary | ICD-10-CM | POA: Diagnosis not present

## 2019-09-02 DIAGNOSIS — E782 Mixed hyperlipidemia: Secondary | ICD-10-CM | POA: Diagnosis not present

## 2019-09-02 DIAGNOSIS — E1165 Type 2 diabetes mellitus with hyperglycemia: Secondary | ICD-10-CM | POA: Diagnosis not present

## 2019-09-02 DIAGNOSIS — N183 Chronic kidney disease, stage 3 unspecified: Secondary | ICD-10-CM | POA: Diagnosis not present

## 2019-09-02 LAB — HEPATIC FUNCTION PANEL
AG Ratio: 1.2 (calc) (ref 1.0–2.5)
ALT: 8 U/L (ref 6–29)
AST: 13 U/L (ref 10–35)
Albumin: 3.9 g/dL (ref 3.6–5.1)
Alkaline phosphatase (APISO): 105 U/L (ref 37–153)
Bilirubin, Direct: 0.1 mg/dL (ref 0.0–0.2)
Globulin: 3.2 g/dL (calc) (ref 1.9–3.7)
Indirect Bilirubin: 0.4 mg/dL (calc) (ref 0.2–1.2)
Total Bilirubin: 0.5 mg/dL (ref 0.2–1.2)
Total Protein: 7.1 g/dL (ref 6.1–8.1)

## 2019-09-02 LAB — LIPID PANEL
Cholesterol: 202 mg/dL — ABNORMAL HIGH (ref <200)
HDL: 49 mg/dL — ABNORMAL LOW (ref >=50)
LDL Cholesterol (Calc): 131 mg/dL (calc) — ABNORMAL HIGH
Non-HDL Cholesterol (Calc): 153 mg/dL (calc) — ABNORMAL HIGH (ref <130)
Total CHOL/HDL Ratio: 4.1 (calc) (ref <5.0)
Triglycerides: 108 mg/dL (ref <150)

## 2019-09-02 MED ORDER — ROSUVASTATIN CALCIUM 10 MG PO TABS: 10.0000 mg | ORAL_TABLET | Freq: Every day | ORAL | 3 refills | Status: DC

## 2019-09-02 NOTE — Progress Notes (Signed)
[facility] 10  

## 2019-09-03 ENCOUNTER — Other Ambulatory Visit: Payer: Self-pay

## 2019-09-03 LAB — COMPLETE METABOLIC PANEL WITH GFR
AG Ratio: 1.3 (calc) (ref 1.0–2.5)
ALT: 8 U/L (ref 6–29)
AST: 12 U/L (ref 10–35)
Albumin: 4 g/dL (ref 3.6–5.1)
Alkaline phosphatase (APISO): 107 U/L (ref 37–153)
BUN/Creatinine Ratio: 18 (calc) (ref 6–22)
BUN: 26 mg/dL — ABNORMAL HIGH (ref 7–25)
CO2: 30 mmol/L (ref 20–32)
Calcium: 9.3 mg/dL (ref 8.6–10.4)
Chloride: 102 mmol/L (ref 98–110)
Creat: 1.48 mg/dL — ABNORMAL HIGH (ref 0.50–1.05)
GFR, Est African American: 44 mL/min/{1.73_m2} — ABNORMAL LOW (ref >=60)
GFR, Est Non African American: 38 mL/min/{1.73_m2} — ABNORMAL LOW (ref >=60)
Globulin: 3.1 g/dL (calc) (ref 1.9–3.7)
Glucose, Bld: 115 mg/dL — ABNORMAL HIGH (ref 65–99)
Potassium: 4.4 mmol/L (ref 3.5–5.3)
Sodium: 140 mmol/L (ref 135–146)
Total Bilirubin: 0.5 mg/dL (ref 0.2–1.2)
Total Protein: 7.1 g/dL (ref 6.1–8.1)

## 2019-09-03 LAB — HEMOGLOBIN A1C
Hgb A1c MFr Bld: 8.8 % of total Hgb — ABNORMAL HIGH (ref <5.7)
Mean Plasma Glucose: 206 (calc)
eAG (mmol/L): 11.4 (calc)

## 2019-09-03 MED ORDER — PANTOPRAZOLE SODIUM 20 MG PO TBEC: 20.0000 mg | DELAYED_RELEASE_TABLET | Freq: Every day | ORAL | 0 refills | Status: DC

## 2019-09-05 ENCOUNTER — Other Ambulatory Visit: Payer: Self-pay

## 2019-09-05 MED ORDER — TORSEMIDE 20 MG PO TABS: ORAL_TABLET | ORAL | 1 refills | Status: DC

## 2019-09-05 MED ORDER — METOLAZONE 2.5 MG PO TABS: 2.5000 mg | ORAL_TABLET | Freq: Every day | ORAL | 0 refills | Status: DC

## 2019-09-08 ENCOUNTER — Telehealth: Payer: Self-pay | Admitting: Family Medicine

## 2019-09-08 ENCOUNTER — Telehealth: Payer: Self-pay | Admitting: *Deleted

## 2019-09-08 NOTE — Telephone Encounter (Signed)
pls let pt know I have information from independent clinical case manger , with report from Dr Jomarie Longs and I agree with his assessment. Pls let her know that he recommended she may need his re eval in 6 months

## 2019-09-08 NOTE — Telephone Encounter (Signed)
FMLA form received copied noted and sleeved

## 2019-09-10 NOTE — Telephone Encounter (Signed)
Called pt and let her know and also let her know that I would fax forms back to [place].

## 2019-09-11 ENCOUNTER — Encounter: Payer: Self-pay | Admitting: "Endocrinology

## 2019-09-11 ENCOUNTER — Ambulatory Visit (INDEPENDENT_AMBULATORY_CARE_PROVIDER_SITE_OTHER): Payer: Medicare Other | Admitting: "Endocrinology

## 2019-09-11 ENCOUNTER — Other Ambulatory Visit: Payer: Self-pay

## 2019-09-11 ENCOUNTER — Ambulatory Visit: Payer: PRIVATE HEALTH INSURANCE | Admitting: "Endocrinology

## 2019-09-11 DIAGNOSIS — E559 Vitamin D deficiency, unspecified: Secondary | ICD-10-CM

## 2019-09-11 DIAGNOSIS — I10 Essential (primary) hypertension: Secondary | ICD-10-CM

## 2019-09-11 DIAGNOSIS — N183 Chronic kidney disease, stage 3 unspecified: Secondary | ICD-10-CM

## 2019-09-11 DIAGNOSIS — E782 Mixed hyperlipidemia: Secondary | ICD-10-CM | POA: Diagnosis not present

## 2019-09-11 DIAGNOSIS — E1165 Type 2 diabetes mellitus with hyperglycemia: Secondary | ICD-10-CM

## 2019-09-11 DIAGNOSIS — E1122 Type 2 diabetes mellitus with diabetic chronic kidney disease: Secondary | ICD-10-CM | POA: Diagnosis not present

## 2019-09-11 DIAGNOSIS — IMO0002 Reserved for concepts with insufficient information to code with codable children: Secondary | ICD-10-CM

## 2019-09-11 MED ORDER — HUMULIN 70/30 KWIKPEN (70-30) 100 UNIT/ML ~~LOC~~ SUPN: 45.0000 [IU] | PEN_INJECTOR | Freq: Two times a day (BID) | SUBCUTANEOUS | 1 refills | Status: DC

## 2019-09-11 NOTE — Progress Notes (Signed)
09/11/2019, 8:38 AM                                                     Endocrinology Telehealth Visit Follow up Note -During COVID -19 Pandemic  This visit type was conducted due to national recommendations for restrictions regarding the COVID-19 Pandemic  in an effort to limit this patient's exposure and mitigate transmission of the corona virus.  Due to her co-morbid illnesses, Kendra Hanson is at  moderate to high risk for complications without adequate follow up.  This format is felt to be most appropriate for her at this time.  I connected with this patient on 09/11/2019   by telephone and verified that I am speaking with the correct person using two identifiers. Kendra Hanson, [birth year]. she has verbally consented to this visit. All issues noted in this document were discussed and addressed. The format was not optimal for physical exam.   Subjective:    [name], female    DOB: [birth year].  Kendra Hanson is engaged in for follow-up in the management of her currently uncontrolled type 2 diabetes, hypertension, hyperlipidemia.   -PMD:   Fayrene Helper, MD.   Past Medical History:  Diagnosis Date  . Anemia   . Fibroids    Uterine  . GERD (gastroesophageal reflux disease)   . Hyperlipidemia 2008   Lipid profile in 04/2011:136, 53, 43  . Hypertension 2008   Normal CBC and CMet in 2012; negative stress nuclear in 2006- patient asymptomatic  . Insulin dependent diabetes mellitus 1996  . Multiple allergies    perennial  . Normocytic normochromic anemia 12/27/2015  . Obesity   . PONV (postoperative nausea and vomiting)   . Sleep apnea    CPAP   Past Surgical History:  Procedure Laterality Date  . COLONOSCOPY N/A 04/05/2015   Procedure: COLONOSCOPY;  Surgeon: Rogene Houston, MD;  Location: [facility];  Service: Endoscopy;  Laterality: N/A;  730  . DENTAL SURGERY    .  ESOPHAGOGASTRODUODENOSCOPY N/A 01/21/2016   Procedure: ESOPHAGOGASTRODUODENOSCOPY (EGD);  Surgeon: Rogene Houston, MD;  Location: [facility];  Service: Endoscopy;  Laterality: N/A;  11:15  . REFRACTIVE SURGERY  2011   Bilateral, two seperate occasions first in 2006  . REMOVAL OF IMPLANT    . RETINAL DETACHMENT SURGERY Bilateral 05/2005   Social History   Socioeconomic History  . Marital status: Married    Spouse name: Not on file  . Number of children: Not on file  . Years of education: Not on file  . Highest education level: Not on file  Occupational History  . Occupation: Employed    Fish farm manager: [employer]  Social Needs  . Financial resource strain: Not on file  . Food insecurity    Worry: Not on file    Inability: Not on file  . Transportation needs    Medical: Not on file    Non-medical: Not on file  Tobacco Use  . Smoking status: Never Smoker  . Smokeless tobacco: Never Used  Substance and Sexual Activity  . Alcohol use: No  .  Drug use: No  . Sexual activity: Yes    Birth control/protection: None, Implant  Lifestyle  . Physical activity    Days per week: Not on file    Minutes per session: Not on file  . Stress: Not on file  Relationships  . Social Herbalist on phone: Not on file    Gets together: Not on file    Attends religious service: Not on file    Active member of club or organization: Not on file    Attends meetings of clubs or organizations: Not on file    Relationship status: Not on file  Other Topics Concern  . Not on file  Social History Narrative   Married with 2 children   Outpatient Encounter Medications as of 09/11/2019  Medication Sig  . ACCU-CHEK GUIDE test strip USE TO TEST FOUR TIMES DAILY AS DIRECTED.  Marland Kitchen azelastine (ASTELIN) 0.1 % nasal spray Place 2 sprays into both nostrils 2 (two) times daily. Use in each nostril as directed  . benzonatate (TESSALON) 100 MG capsule Take 1 capsule (100 mg total) by mouth 2 (two) times  daily as needed for cough.  . blood glucose meter kit and supplies Three times daily testing dx e11.65 (dispense formulary preferred Accuchek compact plus)  . blood glucose meter kit and supplies Three times daily testing DX E11.65 Dispense True Metric meter per formulary  . blood glucose meter kit and supplies Dispense based on patient and insurance preference. Use up to four times daily as directed. (FOR ICD-10 E11.65)  . Blood Glucose Monitoring Suppl (ACCU-CHEK GUIDE ME) w/Device KIT 1 Piece by Does not apply route as directed.  . Blood Glucose Monitoring Suppl (FREESTYLE LITE) DEVI   . cetirizine (ZYRTEC) 10 MG tablet Take 10 mg by mouth daily.  . cyclobenzaprine (FLEXERIL) 10 MG tablet Take 1 tablet (10 mg total) by mouth 3 (three) times daily as needed for muscle spasms.  Marland Kitchen diltiazem (TIAZAC) 360 MG 24 hr capsule Take 1 capsule (360 mg total) by mouth daily.  Mariane Baumgarten Calcium (STOOL SOFTENER PO) Take by mouth daily.  . ergocalciferol (VITAMIN D2) 1.25 MG (50000 UT) capsule Take 1 capsule (50,000 Units total) by mouth once a week. One capsule once weekly  . estradiol (VIVELLE-DOT) 0.0375 MG/24HR Place 1 patch onto the skin 3 (three) times a week. Monday, Wednesday, and Saturday.  . ferrous sulfate 325 (65 FE) MG tablet Take 325 mg by mouth daily with breakfast.  . fluticasone (FLONASE) 50 MCG/ACT nasal spray Place 2 sprays into both nostrils daily.  Marland Kitchen glipiZIDE (GLUCOTROL XL) 5 MG 24 hr tablet TAKE 1 TABLET BY MOUTH  TWICE DAILY WITH A MEAL  . GLOBAL EASE INJECT PEN NEEDLES 31G X 8 MM MISC USE UP TO 5 TIMES A DAY AS DIRECTED.  . Insulin Isophane & Regular Human (HUMULIN 70/30 KWIKPEN) (70-30) 100 UNIT/ML PEN Inject 45-50 Units into the skin 2 (two) times daily before a meal.  . Lancets (FREESTYLE) lancets Use as instructed three times daily dx e11.65  . metolazone (ZAROXOLYN) 2.5 MG tablet Take 1 tablet (2.5 mg total) by mouth daily.  . montelukast (SINGULAIR) 10 MG tablet Take 1 tablet  (10 mg total) by mouth at bedtime.  . ondansetron (ZOFRAN) 4 MG tablet Take 1 tablet (4 mg total) by mouth 2 (two) times daily as needed for nausea or vomiting.  . pantoprazole (PROTONIX) 20 MG tablet Take 1 tablet (20 mg total) by mouth daily.  Marland Kitchen  potassium chloride (K-DUR) 10 MEQ tablet Take 1 tablet (10 mEq total) by mouth 3 (three) times daily.  . progesterone (PROMETRIUM) 100 MG capsule Take 100 mg by mouth daily.  . rosuvastatin (CRESTOR) 10 MG tablet Take 1 tablet (10 mg total) by mouth daily.  . Semaglutide, 1 MG/DOSE, (OZEMPIC, 1 MG/DOSE,) 2 MG/1.5ML SOPN Inject 0.5 mg into the skin once a week.  . torsemide (DEMADEX) 20 MG tablet Take one tab by mouth (24m) every other day, alternating with two tabs (446m every other day  . UNABLE TO FIND Diabetes shoes x 1 and inserts x 3 Dx:E11.29  . UNABLE TO FIND Compression hose (knee high) 15-2038m pair Dx bilateral leg swelling  . UNABLE TO FIND Diabetic shoes x 1 inserts x 3  Dx E11.9  . vitamin B-12 (CYANOCOBALAMIN) 100 MCG tablet Take 100 mcg by mouth daily.  . [DISCONTINUED] HUMULIN 70/30 KWIKPEN (70-30) 100 UNIT/ML PEN INJECT 40 UNITS INTO THE  SKIN TWICE DAILY BEFORE A  MEAL   No facility-administered encounter medications on file as of 09/11/2019.     ALLERGIES: Allergies  Allergen Reactions  . Nsaids     Kidney function   . Diclofenac Other (See Comments)    hallucination  . Oxycodone     Vomiting     VACCINATION STATUS: Immunization History  Administered Date(s) Administered  . Influenza Split 08/14/2014  . Influenza,inj,Quad PF,6+ Mos 08/02/2018, 07/14/2019  . Pneumococcal Conjugate-13 02/01/2015  . Pneumococcal Polysaccharide-23 05/23/2005, 03/28/2016  . Tdap 10/08/2013  . Zoster Recombinat (Shingrix) 10/31/2018, 04/28/2019    Diabetes She presents for her follow-up diabetic visit. She has type 2 diabetes mellitus. Onset time: She was diagnosed at approximate age of [age]. Her disease course has been  worsening. Pertinent negatives for hypoglycemia include no confusion, headaches, pallor, seizures or sweats. Pertinent negatives for diabetes include no chest pain, no fatigue, no polydipsia, no polyphagia and no polyuria. There are no hypoglycemic complications. Symptoms are worsening. Diabetic complications include nephropathy. Risk factors for coronary artery disease include diabetes mellitus, dyslipidemia, hypertension, obesity and sedentary lifestyle. Current diabetic treatment includes insulin injections. She is following a generally unhealthy diet. When asked about meal planning, she reported none. She has not had a previous visit with a dietitian. She never participates in exercise. Her home blood glucose trend is fluctuating minimally. Her breakfast blood glucose range is generally 180-200 mg/dl. Her bedtime blood glucose range is generally 180-200 mg/dl. Her overall blood glucose range is 180-200 mg/dl. (She reports improved , but still significantly above target  glycemic profile. her A1c is 8.8%  increasing from 8.4%. ) An ACE inhibitor/angiotensin II receptor blocker is not being taken. Eye exam is current.  Hyperlipidemia This is a chronic problem. The problem is uncontrolled. Exacerbating diseases include diabetes and obesity. Pertinent negatives include no chest pain, myalgias or shortness of breath. Current antihyperlipidemic treatment includes statins. Risk factors for coronary artery disease include dyslipidemia, diabetes mellitus, a sedentary lifestyle, post-menopausal and family history.    Objective:    There were no vitals taken for this visit.  Wt Readings from Last 3 Encounters:  07/02/19 (!) 313 lb (142 kg)  06/16/19 (!) 313 lb (142 kg)  05/12/19 (!) 314 lb (142.4 kg)     CMP     Component Value Date/Time   NA 140 09/02/2019 0844   NA 141 06/04/2019 1059   K 4.4 09/02/2019 0844   CL 102 09/02/2019 0844   CO2 30 09/02/2019 0844  GLUCOSE 115 (H) 09/02/2019 0844   BUN  26 (H) 09/02/2019 0844   BUN 18 06/04/2019 1059   CREATININE 1.48 (H) 09/02/2019 0844   CALCIUM 9.3 09/02/2019 0844   PROT 7.1 09/02/2019 0846   PROT 7.2 06/04/2019 1059   ALBUMIN 4.1 06/04/2019 1059   AST 13 09/02/2019 0846   ALT 8 09/02/2019 0846   ALKPHOS 114 06/04/2019 1059   BILITOT 0.5 09/02/2019 0846   BILITOT 0.4 06/04/2019 1059   GFRNONAA 38 (L) 09/02/2019 0844   GFRAA 44 (L) 09/02/2019 0844     Diabetic Labs (most recent): Lab Results  Component Value Date   HGBA1C 8.8 (H) 09/02/2019   HGBA1C 8.4 (H) 06/04/2019   HGBA1C 8.6 (H) 02/19/2019    Lipid Panel     Component Value Date/Time   CHOL 202 (H) 09/02/2019 0846   CHOL 247 (H) 04/17/2019 1043   TRIG 108 09/02/2019 0846   HDL 49 (L) 09/02/2019 0846   HDL 52 04/17/2019 1043   CHOLHDL 4.1 09/02/2019 0846   VLDL 15 04/12/2017 1121   LDLCALC 131 (H) 09/02/2019 0846      Assessment & Plan:   1. Uncontrolled type 2 diabetes mellitus with stage 3 chronic kidney disease (Kendra Hanson)  - Kendra Hanson has currently uncontrolled symptomatic type 2 DM since 58 years of age. -She is currently on premixed insulin Humulin 70/30 , reports significantly above target glycemic profile.  Her A1c is 8.8% increasing from 8.1%.    -Before breakfast readings between 71-204, before supper readings between 77-248..    -her diabetes is complicated by stage 3 renal insufficiency and she remains at a high risk for more acute and chronic complications which include CAD, CVA, CKD, retinopathy, and neuropathy. These are all discussed in detail with her.  - I have counseled her on diet management and weight loss, by adopting a carbohydrate restricted/protein rich diet.  - she  admits there is a room for improvement in her diet and drink choices. -  Suggestion is made for her to avoid simple carbohydrates  from her diet including Cakes, Sweet Desserts / Pastries, Ice Cream, Soda (diet and regular), Sweet Tea, Candies, Chips, Cookies, Sweet  Pastries,  Store Bought Juices, Alcohol in Excess of  1-2 drinks a day, Artificial Sweeteners, Coffee Creamer, and "Sugar-free" Products. This will help patient to have stable blood glucose profile and potentially avoid unintended weight gain.   - I encouraged her to switch to  unprocessed or minimally processed complex starch and increased protein intake (animal or plant source), fruits, and vegetables.  - she is advised to stick to a routine mealtimes to eat 3 meals  a day and avoid unnecessary snacks ( to snack only to correct hypoglycemia).    - I have approached her with the following individualized plan to manage diabetes and patient agrees:    -She wishes to stay on her premixed insulin. -She is advised to increase her Humulin 70/30 to 50 units with breakfast and 45 units with supper , associated with strict monitoring of blood glucose 4 times a day-before meals and at bedtime return with her meter and logs and repeat labs in 4 weeks.   She still complains of cost of medications even with the cheaper options. -  She will continue Ozempic 0.5 mg subcutaneously weekly.  Advised to increase her glipizide 5 mg p.o. with breakfast and 5 mg p.o. with supper.  - she is encouraged to call clinic for fasting blood glucose levels  less than 70 or above 200 mg /dl.   - she is not a candidate for metformin, SGLT2 inhibitors due to concurrent renal insufficiency.  - Patient specific target  A1c;  LDL, HDL, Triglycerides, and  Waist Circumference were discussed in detail.  2) Blood Pressure /Hypertension:  she is advised to home monitor blood pressure and report if > 140/90 on 2 separate readings.     she is advised to continue her current medications including diltiazem 360 mg p.o. daily with breakfast .  3) Lipids/Hyperlipidemia:   Review of her recent lipid panel showed  uncontrolled  LDL at 131 improving from 187.  Although she is responding to it, she is reporting inconsistency taking her  Crestor that she is advised to resume and continue Crestor 10 mg p.o. nightly.   - she is  advised to maintain close follow up with Fayrene Helper, MD for primary care needs, as well as her other providers for optimal and coordinated care.  - Patient Care Time Today:  25 min, of which >50% was spent in  counseling and the rest reviewing her  current and  previous labs/studies, previous treatments, her blood glucose readings, and medications' doses and developing a plan for long-term care based on the latest recommendations for standards of care.   Netta Cedars participated in the discussions, expressed understanding, and voiced agreement with the above plans.  All questions were answered to her satisfaction. she is encouraged to contact clinic should she have any questions or concerns prior to her return visit.   Follow up plan: - Return in about 3 months (around 12/12/2019) for Bring Meter and Logs- A1c in Office, Include 8 log sheets.  Glade Lloyd, MD [facility] [address] [place], [place] [address] Phone: [phone]  Fax: [phone]    09/11/2019, 8:38 AM  This note was partially dictated with voice recognition software. Similar sounding words can be transcribed inadequately or may not  be corrected upon review.

## 2019-09-24 ENCOUNTER — Encounter: Payer: Self-pay | Admitting: Family Medicine

## 2019-09-24 ENCOUNTER — Other Ambulatory Visit: Payer: Self-pay

## 2019-09-24 ENCOUNTER — Ambulatory Visit (INDEPENDENT_AMBULATORY_CARE_PROVIDER_SITE_OTHER): Payer: Medicare Other | Admitting: Family Medicine

## 2019-09-24 VITALS — BP 112/84 | HR 93 | Temp 98.7°F | Resp 15 | Ht 65.0 in | Wt 320.1 lb

## 2019-09-24 DIAGNOSIS — N1832 Chronic kidney disease, stage 3b: Secondary | ICD-10-CM

## 2019-09-24 DIAGNOSIS — I110 Hypertensive heart disease with heart failure: Secondary | ICD-10-CM | POA: Diagnosis not present

## 2019-09-24 MED ORDER — PANTOPRAZOLE SODIUM 20 MG PO TBEC: 20.0000 mg | DELAYED_RELEASE_TABLET | Freq: Every day | ORAL | 0 refills | Status: DC

## 2019-09-24 NOTE — Patient Instructions (Signed)
  I appreciate the opportunity to provide you with care for your health and wellness. Today we discussed: blood pressure  Follow up: 09/30/2019 as scheduled  No labs or referrals today  Please avoid eating out, fast food and take out is full of salt, this causes elevation in blood pressure.  Please start taking the Metolazone daily and the Torsemide as directed on the bottle (daily but rotating 20-40 mg). If you get cramps call your kidney doctor to discuss.  Keep a check on BP readings, and follow up with Dr Moshe Cipro next week.  I hope you have a wonderful, happy, safe, and healthy Holiday Season!  Please continue to practice social distancing to keep you, your family, and our community safe.  If you must go out, please wear a mask and practice good handwashing.  It was a pleasure to see you and I look forward to continuing to work together on your health and well-being. Please do not hesitate to call the office if you need care or have questions about your care.  Have a wonderful day and week. With Gratitude, Cherly Beach, DNP, AGNP-BC

## 2019-09-24 NOTE — Progress Notes (Signed)
Subjective:     [name], female   DOB: Jan 23, 1961, 58 y.o.   MRN: [id number]  Kendra Hanson presents for Hypertension (pt states bp has been running high x 3 days) Reports that blood pressure has been running high for the last several days.  Has a history of hyperlipidemia, hypertension, insulin-dependent diabetes among others.  Reports that blood pressures at home have been systolically in the 846-962 over the 80-90 range.  She reports that she ate out most meals over the weekend as well.  Has not been avoiding salt.  Additionally in conversation she reports that she had not been taking her fluid medications as she was directed.  Reports that she has been taking her metolazone every other day.  And her torsemide every other day 20 mg.  Because she was getting bad cramps.  She has not talked to her kidney provider about this.  Reports taking her Cardizem as directed.  Reports that she was taken off of amlodipine as she is having low blood pressures in the systolic 95M per her a while back.  Overall she has not had any other questions or concerns and is feeling okay today in the office.  Denies having any chest pain, leg swelling, palpitations, cough shortness of breath, dizziness, headaches.  Today patient denies signs and symptoms of COVID 19 infection including fever, chills, cough, shortness of breath, and headache.  Past Medical, Surgical, Social History, Allergies, and Medications have been Reviewed. Past Medical History:  Diagnosis Date  . Anemia   . Fibroids    Uterine  . GERD (gastroesophageal reflux disease)   . Hyperlipidemia 2008   Lipid profile in 04/2011:136, 53, 43  . Hypertension 2008   Normal CBC and CMet in 2012; negative stress nuclear in 2006- patient asymptomatic  . Insulin dependent diabetes mellitus 1996  . Multiple allergies    perennial  . Normocytic normochromic anemia 12/27/2015  . Obesity   . PONV (postoperative nausea and vomiting)   .  Sleep apnea    CPAP   Past Surgical History:  Procedure Laterality Date  . COLONOSCOPY N/A 04/05/2015   Procedure: COLONOSCOPY;  Surgeon: Rogene Houston, MD;  Location: [facility];  Service: Endoscopy;  Laterality: N/A;  730  . DENTAL SURGERY    . ESOPHAGOGASTRODUODENOSCOPY N/A 01/21/2016   Procedure: ESOPHAGOGASTRODUODENOSCOPY (EGD);  Surgeon: Rogene Houston, MD;  Location: [facility];  Service: Endoscopy;  Laterality: N/A;  11:15  . REFRACTIVE SURGERY  2011   Bilateral, two seperate occasions first in 2006  . REMOVAL OF IMPLANT    . RETINAL DETACHMENT SURGERY Bilateral 05/2005   Social History   Socioeconomic History  . Marital status: Married    Spouse name: Not on file  . Number of children: Not on file  . Years of education: Not on file  . Highest education level: Not on file  Occupational History  . Occupation: Employed    Fish farm manager: [employer]  Social Needs  . Financial resource strain: Not on file  . Food insecurity    Worry: Not on file    Inability: Not on file  . Transportation needs    Medical: Not on file    Non-medical: Not on file  Tobacco Use  . Smoking status: Never Smoker  . Smokeless tobacco: Never Used  Substance and Sexual Activity  . Alcohol use: No  . Drug use: No  . Sexual activity: Yes    Birth control/protection:  None, Implant  Lifestyle  . Physical activity    Days per week: Not on file    Minutes per session: Not on file  . Stress: Not on file  Relationships  . Social Herbalist on phone: Not on file    Gets together: Not on file    Attends religious service: Not on file    Active member of club or organization: Not on file    Attends meetings of clubs or organizations: Not on file    Relationship status: Not on file  . Intimate partner violence    Fear of current or ex partner: Not on file    Emotionally abused: Not on file    Physically abused: Not on file    Forced sexual activity: Not on file  Other Topics  Concern  . Not on file  Social History Narrative   Married with 2 children    Outpatient Encounter Medications as of 09/24/2019  Medication Sig  . ACCU-CHEK GUIDE test strip USE TO TEST FOUR TIMES DAILY AS DIRECTED.  Marland Kitchen azelastine (ASTELIN) 0.1 % nasal spray Place 2 sprays into both nostrils 2 (two) times daily. Use in each nostril as directed  . benzonatate (TESSALON) 100 MG capsule Take 1 capsule (100 mg total) by mouth 2 (two) times daily as needed for cough.  . blood glucose meter kit and supplies Three times daily testing dx e11.65 (dispense formulary preferred Accuchek compact plus)  . blood glucose meter kit and supplies Three times daily testing DX E11.65 Dispense True Metric meter per formulary  . blood glucose meter kit and supplies Dispense based on patient and insurance preference. Use up to four times daily as directed. (FOR ICD-10 E11.65)  . Blood Glucose Monitoring Suppl (ACCU-CHEK GUIDE ME) w/Device KIT 1 Piece by Does not apply route as directed.  . Blood Glucose Monitoring Suppl (FREESTYLE LITE) DEVI   . cetirizine (ZYRTEC) 10 MG tablet Take 10 mg by mouth daily.  . cyclobenzaprine (FLEXERIL) 10 MG tablet Take 1 tablet (10 mg total) by mouth 3 (three) times daily as needed for muscle spasms.  Marland Kitchen diltiazem (TIAZAC) 360 MG 24 hr capsule Take 1 capsule (360 mg total) by mouth daily.  Mariane Baumgarten Calcium (STOOL SOFTENER PO) Take by mouth daily.  . ergocalciferol (VITAMIN D2) 1.25 MG (50000 UT) capsule Take 1 capsule (50,000 Units total) by mouth once a week. One capsule once weekly  . estradiol (VIVELLE-DOT) 0.0375 MG/24HR Place 1 patch onto the skin 3 (three) times a week. Monday, Wednesday, and Saturday.  . ferrous sulfate 325 (65 FE) MG tablet Take 325 mg by mouth daily with breakfast.  . fluticasone (FLONASE) 50 MCG/ACT nasal spray Place 2 sprays into both nostrils daily.  Marland Kitchen glipiZIDE (GLUCOTROL XL) 5 MG 24 hr tablet TAKE 1 TABLET BY MOUTH  TWICE DAILY WITH A MEAL  . GLOBAL  EASE INJECT PEN NEEDLES 31G X 8 MM MISC USE UP TO 5 TIMES A DAY AS DIRECTED.  . Insulin Isophane & Regular Human (HUMULIN 70/30 KWIKPEN) (70-30) 100 UNIT/ML PEN Inject 45-50 Units into the skin 2 (two) times daily before a meal.  . Lancets (FREESTYLE) lancets Use as instructed three times daily dx e11.65  . metolazone (ZAROXOLYN) 2.5 MG tablet Take 1 tablet (2.5 mg total) by mouth daily.  . montelukast (SINGULAIR) 10 MG tablet Take 1 tablet (10 mg total) by mouth at bedtime.  . ondansetron (ZOFRAN) 4 MG tablet Take 1 tablet (4 mg total)  by mouth 2 (two) times daily as needed for nausea or vomiting.  . pantoprazole (PROTONIX) 20 MG tablet Take 1 tablet (20 mg total) by mouth daily.  . potassium chloride (K-DUR) 10 MEQ tablet Take 1 tablet (10 mEq total) by mouth 3 (three) times daily.  . progesterone (PROMETRIUM) 100 MG capsule Take 100 mg by mouth daily.  . rosuvastatin (CRESTOR) 10 MG tablet Take 1 tablet (10 mg total) by mouth daily.  . Semaglutide, 1 MG/DOSE, (OZEMPIC, 1 MG/DOSE,) 2 MG/1.5ML SOPN Inject 0.5 mg into the skin once a week.  . torsemide (DEMADEX) 20 MG tablet Take one tab by mouth (54m) every other day, alternating with two tabs (463m every other day  . UNABLE TO FIND Diabetes shoes x 1 and inserts x 3 Dx:E11.29  . UNABLE TO FIND Compression hose (knee high) 15-2053m pair Dx bilateral leg swelling  . UNABLE TO FIND Diabetic shoes x 1 inserts x 3  Dx E11.9  . vitamin B-12 (CYANOCOBALAMIN) 100 MCG tablet Take 100 mcg by mouth daily.  . [DISCONTINUED] pantoprazole (PROTONIX) 20 MG tablet Take 1 tablet (20 mg total) by mouth daily.   No facility-administered encounter medications on file as of 09/24/2019.    Allergies  Allergen Reactions  . Nsaids     Kidney function   . Diclofenac Other (See Comments)    hallucination  . Oxycodone     Vomiting     Review of Systems  Constitutional: Negative for chills and fever.  HENT: Negative.   Eyes: Negative.   Respiratory:  Negative.   Cardiovascular: Negative.   Gastrointestinal: Negative.   Endocrine: Negative.   Genitourinary: Negative.   Musculoskeletal: Negative.   Skin: Negative.   Allergic/Immunologic: Negative.   Neurological: Negative.   Hematological: Negative.   Psychiatric/Behavioral: Negative.   All other systems reviewed and are negative.      Objective:     BP 112/84   Pulse 93   Temp 98.7 F (37.1 C) (Oral)   Resp 15   Ht _0  (1.651 m)   Wt (!) 320 lb 1.9 oz (145.2 kg)   SpO2 97%   BMI 53.27 kg/m   Physical Exam Vitals signs and nursing note reviewed.  Constitutional:      Appearance: Normal appearance. She is well-developed and well-groomed. She is morbidly obese.  HENT:     Head: Normocephalic and atraumatic.     Right Ear: External ear normal.     Left Ear: External ear normal.     Nose: Nose normal.     Mouth/Throat:     Mouth: Mucous membranes are moist.     Pharynx: Oropharynx is clear.  Eyes:     General:        Right eye: No discharge.        Left eye: No discharge.     Conjunctiva/sclera: Conjunctivae normal.  Neck:     Musculoskeletal: Normal range of motion and neck supple.  Cardiovascular:     Rate and Rhythm: Normal rate and regular rhythm.     Pulses: Normal pulses.     Heart sounds: Normal heart sounds.  Pulmonary:     Effort: Pulmonary effort is normal.     Breath sounds: Normal breath sounds.  Musculoskeletal: Normal range of motion.  Skin:    General: Skin is warm.  Neurological:     General: No focal deficit present.     Mental Status: She is alert and oriented to person, place, and time.  Psychiatric:        Attention and Perception: Attention normal.        Mood and Affect: Mood normal.        Speech: Speech normal.        Behavior: Behavior normal. Behavior is cooperative.        Thought Content: Thought content normal.        Cognition and Memory: Cognition normal.        Judgment: Judgment normal.        Assessment and  Plan        1. Hypertensive heart disease with heart failure ([place]) Controlled today in the office.  Taking bilaterally and manually.  Reports that as per HPI had not been taking her fluid medications as directed.  And has been eating out and not exercising.  Provided education on maintaining a low-salt diet as well as exercising daily.  Has follow-up with Dr. Moshe Cipro in a week.  Advised for her to continue to check her blood pressure and to take her fluid pills as directed.  This was reviewed in detail with her.  As well as provided in her after visit summary  2. Stage 3b chronic kidney disease Has not been taking her fluid pills as directed.  Education was provided on how to take these and she reports that she gets cramps with them advised for her to talk to Dr. Lowanda Foster her kidney provider.  Follow-up with Dr. Moshe Cipro in 1 week.  Follow up: 09/30/2019  Perlie Mayo, DNP, AGNP-BC [address], [address], [place] [address] Office Hours: Mon-Thurs 8 am-5 pm; Fri 8 am-12 pm Office Phone:  [phone]  Office Fax: [phone]

## 2019-09-30 ENCOUNTER — Telehealth (INDEPENDENT_AMBULATORY_CARE_PROVIDER_SITE_OTHER): Payer: Medicare Other | Admitting: Family Medicine

## 2019-09-30 ENCOUNTER — Other Ambulatory Visit: Payer: Self-pay

## 2019-09-30 ENCOUNTER — Encounter: Payer: Self-pay | Admitting: Family Medicine

## 2019-09-30 VITALS — BP 138/87 | Ht 65.0 in | Wt 318.0 lb

## 2019-09-30 DIAGNOSIS — E1122 Type 2 diabetes mellitus with diabetic chronic kidney disease: Secondary | ICD-10-CM

## 2019-09-30 DIAGNOSIS — E559 Vitamin D deficiency, unspecified: Secondary | ICD-10-CM | POA: Diagnosis not present

## 2019-09-30 DIAGNOSIS — I10 Essential (primary) hypertension: Secondary | ICD-10-CM | POA: Diagnosis not present

## 2019-09-30 DIAGNOSIS — J302 Other seasonal allergic rhinitis: Secondary | ICD-10-CM

## 2019-09-30 DIAGNOSIS — E1165 Type 2 diabetes mellitus with hyperglycemia: Secondary | ICD-10-CM

## 2019-09-30 DIAGNOSIS — E782 Mixed hyperlipidemia: Secondary | ICD-10-CM

## 2019-09-30 DIAGNOSIS — IMO0002 Reserved for concepts with insufficient information to code with codable children: Secondary | ICD-10-CM

## 2019-09-30 DIAGNOSIS — N183 Chronic kidney disease, stage 3 unspecified: Secondary | ICD-10-CM

## 2019-09-30 MED ORDER — ROSUVASTATIN CALCIUM 10 MG PO TABS: 10.0000 mg | ORAL_TABLET | Freq: Every day | ORAL | 3 refills | Status: DC

## 2019-09-30 MED ORDER — ERGOCALCIFEROL 1.25 MG (50000 UT) PO CAPS: 50000.0000 [IU] | ORAL_CAPSULE | ORAL | 0 refills | Status: DC

## 2019-09-30 NOTE — Patient Instructions (Addendum)
Virtual visit with MD, end March, call iof you need me before  Fasting lipid, cmp and eGFr, TSH, CBC andf vit D 1 week before follow up  It is important that you exercise regularly at least 30 minutes 5 times a week. If you develop chest pain, have severe difficulty breathing, or feel very tired, stop exercising immediately and seek medical attention    Think about what you will eat, plan ahead. Choose " clean, green, fresh or frozen" over canned, processed or packaged foods which are more sugary, salty and fatty. 70 to 75% of food eaten should be vegetables and fruit. Three meals at set times with snacks allowed between meals, but they must be fruit or vegetables. Aim to eat over a 12 hour period , example 7 am to 7 pm, and STOP after  your last meal of the day. Drink water,generally about 64 ounces per day, no other drink is as healthy. Fruit juice is best enjoyed in a healthy way, by EATING the fruit.   Thanks for choosing [facility], we consider it a privelige to serve you.

## 2019-10-02 ENCOUNTER — Other Ambulatory Visit: Payer: Self-pay | Admitting: "Endocrinology

## 2019-10-02 ENCOUNTER — Other Ambulatory Visit: Payer: Self-pay | Admitting: Family Medicine

## 2019-10-02 DIAGNOSIS — I5032 Chronic diastolic (congestive) heart failure: Secondary | ICD-10-CM | POA: Diagnosis not present

## 2019-10-02 DIAGNOSIS — I129 Hypertensive chronic kidney disease with stage 1 through stage 4 chronic kidney disease, or unspecified chronic kidney disease: Secondary | ICD-10-CM | POA: Diagnosis not present

## 2019-10-02 DIAGNOSIS — E211 Secondary hyperparathyroidism, not elsewhere classified: Secondary | ICD-10-CM | POA: Diagnosis not present

## 2019-10-02 DIAGNOSIS — N1832 Chronic kidney disease, stage 3b: Secondary | ICD-10-CM | POA: Diagnosis not present

## 2019-10-02 DIAGNOSIS — IMO0002 Reserved for concepts with insufficient information to code with codable children: Secondary | ICD-10-CM

## 2019-10-02 DIAGNOSIS — E1122 Type 2 diabetes mellitus with diabetic chronic kidney disease: Secondary | ICD-10-CM

## 2019-10-02 DIAGNOSIS — N189 Chronic kidney disease, unspecified: Secondary | ICD-10-CM | POA: Diagnosis not present

## 2019-10-02 DIAGNOSIS — N183 Chronic kidney disease, stage 3 unspecified: Secondary | ICD-10-CM

## 2019-10-03 ENCOUNTER — Other Ambulatory Visit: Payer: Self-pay

## 2019-10-07 ENCOUNTER — Other Ambulatory Visit: Payer: Self-pay

## 2019-10-07 ENCOUNTER — Encounter: Payer: Self-pay | Admitting: Family Medicine

## 2019-10-07 ENCOUNTER — Ambulatory Visit (INDEPENDENT_AMBULATORY_CARE_PROVIDER_SITE_OTHER): Payer: Medicare Other | Admitting: Family Medicine

## 2019-10-07 VITALS — BP 117/68 | HR 85 | Temp 98.8°F | Resp 15 | Ht 65.0 in | Wt 316.0 lb

## 2019-10-07 DIAGNOSIS — R9431 Abnormal electrocardiogram [ECG] [EKG]: Secondary | ICD-10-CM | POA: Diagnosis not present

## 2019-10-07 DIAGNOSIS — Z0000 Encounter for general adult medical examination without abnormal findings: Secondary | ICD-10-CM

## 2019-10-07 LAB — POCT URINALYSIS DIP (CLINITEK)
Bilirubin, UA: NEGATIVE
Blood, UA: NEGATIVE
Glucose, UA: NEGATIVE mg/dL
Ketones, POC UA: NEGATIVE mg/dL
Leukocytes, UA: NEGATIVE
Nitrite, UA: NEGATIVE
POC PROTEIN,UA: NEGATIVE
Spec Grav, UA: 1.02 (ref 1.010–1.025)
Urobilinogen, UA: 0.2 E.U./dL
pH, UA: 5 (ref 5.0–8.0)

## 2019-10-07 NOTE — Patient Instructions (Signed)
Kendra Hanson , Thank you for taking time to come for your Medicare Wellness Visit. I appreciate your ongoing commitment to your health goals. Please review the following plan we discussed and let me know if I can assist you in the future.   Please continue to practice social distancing to keep you, your family, and our community safe.  If you must go out, please wear a Mask and practice good handwashing.  We hope you have a safe, happy and healthy Holiday Season!  See you in the New Year. :)  Screening recommendations/referrals: Colonoscopy: Due 2026 Mammogram: Due 12/2018 Bone Density: Due at 58 years old  Recommended yearly ophthalmology/optometry visit for glaucoma screening and checkup Recommended yearly dental visit for hygiene and checkup  Vaccinations: Influenza vaccine: up to date Pneumococcal vaccine: up to date Tdap vaccine: up to date Shingles vaccine: completed  Advanced directives: No paperwork   Conditions/risks identified: Fall   Next appointment: 1 year    Preventive Care 40-64 Years, Female Preventive care refers to lifestyle choices and visits with your health care provider that can promote health and wellness. What does preventive care include?  A yearly physical exam. This is also called an annual well check.  Dental exams once or twice a year.  Routine eye exams. Ask your health care provider how often you should have your eyes checked.  Personal lifestyle choices, including:  Daily care of your teeth and gums.  Regular physical activity.  Eating a healthy diet.  Avoiding tobacco and drug use.  Limiting alcohol use.  Practicing safe sex.  Taking low-dose aspirin daily starting at age 20.  Taking vitamin and mineral supplements as recommended by your health care provider. What happens during an annual well check? The services and screenings done by your health care provider during your annual well check will depend on your age, overall  health, lifestyle risk factors, and family history of disease. Counseling  Your health care provider may ask you questions about your:  Alcohol use.  Tobacco use.  Drug use.  Emotional well-being.  Home and relationship well-being.  Sexual activity.  Eating habits.  Work and work Statistician.  Method of birth control.  Menstrual cycle.  Pregnancy history. Screening  You may have the following tests or measurements:  Height, weight, and BMI.  Blood pressure.  Lipid and cholesterol levels. These may be checked every 5 years, or more frequently if you are over 62 years old.  Skin check.  Lung cancer screening. You may have this screening every year starting at age 29 if you have a 30-pack-year history of smoking and currently smoke or have quit within the past 15 years.  Fecal occult blood test (FOBT) of the stool. You may have this test every year starting at age 34.  Flexible sigmoidoscopy or colonoscopy. You may have a sigmoidoscopy every 5 years or a colonoscopy every 10 years starting at age 109.  Hepatitis C blood test.  Hepatitis B blood test.  Sexually transmitted disease (STD) testing.  Diabetes screening. This is done by checking your blood sugar (glucose) after you have not eaten for a while (fasting). You may have this done every 1-3 years.  Mammogram. This may be done every 1-2 years. Talk to your health care provider about when you should start having regular mammograms. This may depend on whether you have a family history of breast cancer.  BRCA-related cancer screening. This may be done if you have a family history of breast, ovarian, tubal,  or peritoneal cancers.  Pelvic exam and Pap test. This may be done every 3 years starting at age 21. Starting at age 4, this may be done every 5 years if you have a Pap test in combination with an HPV test.  Bone density scan. This is done to screen for osteoporosis. You may have this scan if you are at high  risk for osteoporosis. Discuss your test results, treatment options, and if necessary, the need for more tests with your health care provider. Vaccines  Your health care provider may recommend certain vaccines, such as:  Influenza vaccine. This is recommended every year.  Tetanus, diphtheria, and acellular pertussis (Tdap, Td) vaccine. You may need a Td booster every 10 years.  Zoster vaccine. You may need this after age 75.  Pneumococcal 13-valent conjugate (PCV13) vaccine. You may need this if you have certain conditions and were not previously vaccinated.  Pneumococcal polysaccharide (PPSV23) vaccine. You may need one or two doses if you smoke cigarettes or if you have certain conditions. Talk to your health care provider about which screenings and vaccines you need and how often you need them. This information is not intended to replace advice given to you by your health care provider. Make sure you discuss any questions you have with your health care provider. Document Released: 11/26/2015 Document Revised: 07/19/2016 Document Reviewed: 08/31/2015 Elsevier Interactive Patient Education  2017 [employer] Prevention in the Home Falls can cause injuries. They can happen to people of all ages. There are many things you can do to make your home safe and to help prevent falls. What can I do on the outside of my home?  Regularly fix the edges of walkways and driveways and fix any cracks.  Remove anything that might make you trip as you walk through a door, such as a raised step or threshold.  Trim any bushes or trees on the path to your home.  Use bright outdoor lighting.  Clear any walking paths of anything that might make someone trip, such as rocks or tools.  Regularly check to see if handrails are loose or broken. Make sure that both sides of any steps have handrails.  Any raised decks and porches should have guardrails on the edges.  Have any leaves, snow, or ice  cleared regularly.  Use sand or salt on walking paths during winter.  Clean up any spills in your garage right away. This includes oil or grease spills. What can I do in the bathroom?  Use night lights.  Install grab bars by the toilet and in the tub and shower. Do not use towel bars as grab bars.  Use non-skid mats or decals in the tub or shower.  If you need to sit down in the shower, use a plastic, non-slip stool.  Keep the floor dry. Clean up any water that spills on the floor as soon as it happens.  Remove soap buildup in the tub or shower regularly.  Attach bath mats securely with double-sided non-slip rug tape.  Do not have throw rugs and other things on the floor that can make you trip. What can I do in the bedroom?  Use night lights.  Make sure that you have a light by your bed that is easy to reach.  Do not use any sheets or blankets that are too big for your bed. They should not hang down onto the floor.  Have a firm chair that has side arms. You  can use this for support while you get dressed.  Do not have throw rugs and other things on the floor that can make you trip. What can I do in the kitchen?  Clean up any spills right away.  Avoid walking on wet floors.  Keep items that you use a lot in easy-to-reach places.  If you need to reach something above you, use a strong step stool that has a grab bar.  Keep electrical cords out of the way.  Do not use floor polish or wax that makes floors slippery. If you must use wax, use non-skid floor wax.  Do not have throw rugs and other things on the floor that can make you trip. What can I do with my stairs?  Do not leave any items on the stairs.  Make sure that there are handrails on both sides of the stairs and use them. Fix handrails that are broken or loose. Make sure that handrails are as long as the stairways.  Check any carpeting to make sure that it is firmly attached to the stairs. Fix any carpet that  is loose or worn.  Avoid having throw rugs at the top or bottom of the stairs. If you do have throw rugs, attach them to the floor with carpet tape.  Make sure that you have a light switch at the top of the stairs and the bottom of the stairs. If you do not have them, ask someone to add them for you. What else can I do to help prevent falls?  Wear shoes that:  Do not have high heels.  Have rubber bottoms.  Are comfortable and fit you well.  Are closed at the toe. Do not wear sandals.  If you use a stepladder:  Make sure that it is fully opened. Do not climb a closed stepladder.  Make sure that both sides of the stepladder are locked into place.  Ask someone to hold it for you, if possible.  Clearly mark and make sure that you can see:  Any grab bars or handrails.  First and last steps.  Where the edge of each step is.  Use tools that help you move around (mobility aids) if they are needed. These include:  Canes.  Walkers.  Scooters.  Crutches.  Turn on the lights when you go into a dark area. Replace any light bulbs as soon as they burn out.  Set up your furniture so you have a clear path. Avoid moving your furniture around.  If any of your floors are uneven, fix them.  If there are any pets around you, be aware of where they are.  Review your medicines with your doctor. Some medicines can make you feel dizzy. This can increase your chance of falling. Ask your doctor what other things that you can do to help prevent falls. This information is not intended to replace advice given to you by your health care provider. Make sure you discuss any questions you have with your health care provider. Document Released: 08/26/2009 Document Revised: 04/06/2016 Document Reviewed: 12/04/2014 Elsevier Interactive Patient Education  2017 [employer].

## 2019-10-07 NOTE — Addendum Note (Signed)
Addended by: Perlie Mayo on: 10/07/2019 04:42 PM   Modules accepted: Orders

## 2019-10-07 NOTE — Progress Notes (Signed)
Subjective:    DEMIAH Hanson is a 58 y.o. female who presents for a Welcome to Medicare exam.   Review of Systems          Objective:    There were no vitals filed for this visit.There is no height or weight on file to calculate BMI.  Medications Outpatient Encounter Medications as of 10/07/2019  Medication Sig  . ACCU-CHEK GUIDE test strip USE TO TEST FOUR TIMES DAILY AS DIRECTED.  Marland Kitchen azelastine (ASTELIN) 0.1 % nasal spray Place 2 sprays into both nostrils 2 (two) times daily. Use in each nostril as directed  . benzonatate (TESSALON) 100 MG capsule Take 1 capsule (100 mg total) by mouth 2 (two) times daily as needed for cough.  . blood glucose meter kit and supplies Three times daily testing dx e11.65 (dispense formulary preferred Accuchek compact plus)  . blood glucose meter kit and supplies Three times daily testing DX E11.65 Dispense True Metric meter per formulary  . blood glucose meter kit and supplies Dispense based on patient and insurance preference. Use up to four times daily as directed. (FOR ICD-10 E11.65)  . Blood Glucose Monitoring Suppl (ACCU-CHEK GUIDE ME) w/Device KIT 1 Piece by Does not apply route as directed.  . Blood Glucose Monitoring Suppl (FREESTYLE LITE) DEVI   . cetirizine (ZYRTEC) 10 MG tablet Take 10 mg by mouth daily.  . cyclobenzaprine (FLEXERIL) 10 MG tablet Take 1 tablet (10 mg total) by mouth 3 (three) times daily as needed for muscle spasms.  Marland Kitchen diltiazem (TIAZAC) 360 MG 24 hr capsule Take 1 capsule (360 mg total) by mouth daily.  Kendra Hanson Calcium (STOOL SOFTENER PO) Take by mouth daily.  . ergocalciferol (VITAMIN D2) 1.25 MG (50000 UT) capsule Take 1 capsule (50,000 Units total) by mouth once a week. One capsule once weekly  . estradiol (VIVELLE-DOT) 0.0375 MG/24HR Place 1 patch onto the skin 3 (three) times a week. Monday, Wednesday, and Saturday.  . ferrous sulfate 325 (65 FE) MG tablet Take 325 mg by mouth daily with breakfast.  .  fluticasone (FLONASE) 50 MCG/ACT nasal spray Place 2 sprays into both nostrils daily.  Marland Kitchen glipiZIDE (GLUCOTROL XL) 5 MG 24 hr tablet TAKE 1 TABLET BY MOUTH  TWICE DAILY WITH MEALS  . GLOBAL EASE INJECT PEN NEEDLES 31G X 8 MM MISC USE UP TO 5 TIMES A DAY AS DIRECTED.  Marland Kitchen HUMULIN 70/30 KWIKPEN (70-30) 100 UNIT/ML PEN INJECT 40 UNITS INTO THE  SKIN TWICE DAILY BEFORE A  MEAL  . Lancets (FREESTYLE) lancets Use as instructed three times daily dx e11.65  . metolazone (ZAROXOLYN) 2.5 MG tablet TAKE 1 TABLET BY MOUTH  DAILY  . montelukast (SINGULAIR) 10 MG tablet Take 1 tablet (10 mg total) by mouth at bedtime.  . ondansetron (ZOFRAN) 4 MG tablet Take 1 tablet (4 mg total) by mouth 2 (two) times daily as needed for nausea or vomiting.  . pantoprazole (PROTONIX) 20 MG tablet TAKE 1 TABLET BY MOUTH  DAILY  . potassium chloride (K-DUR) 10 MEQ tablet Take 1 tablet (10 mEq total) by mouth 3 (three) times daily.  . potassium chloride (KLOR-CON) 10 MEQ tablet TAKE 1 TABLET BY MOUTH 3  TIMES DAILY  . progesterone (PROMETRIUM) 100 MG capsule Take 100 mg by mouth daily.  . rosuvastatin (CRESTOR) 10 MG tablet Take 1 tablet (10 mg total) by mouth daily.  . rosuvastatin (CRESTOR) 5 MG tablet TAKE 1 TABLET BY MOUTH  DAILY  . Semaglutide, 1  MG/DOSE, (OZEMPIC, 1 MG/DOSE,) 2 MG/1.5ML SOPN Inject 0.5 mg into the skin once a week.  . torsemide (DEMADEX) 20 MG tablet Take one tab by mouth (60m) every other day, alternating with two tabs (466m every other day  . UNABLE TO FIND Diabetes shoes x 1 and inserts x 3 Dx:E11.29  . UNABLE TO FIND Compression hose (knee high) 15-2037m pair Dx bilateral leg swelling  . UNABLE TO FIND Diabetic shoes x 1 inserts x 3  Dx E11.9  . vitamin B-12 (CYANOCOBALAMIN) 100 MCG tablet Take 100 mcg by mouth daily.   No facility-administered encounter medications on file as of 10/07/2019.      History: Past Medical History:  Diagnosis Date  . Anemia   . Fibroids    Uterine  . GERD  (gastroesophageal reflux disease)   . Hyperlipidemia 2008   Lipid profile in 04/2011:136, 53, 43  . Hypertension 2008   Normal CBC and CMet in 2012; negative stress nuclear in 2006- patient asymptomatic  . Insulin dependent diabetes mellitus 1996  . Multiple allergies    perennial  . Normocytic normochromic anemia 12/27/2015  . Obesity   . PONV (postoperative nausea and vomiting)   . Sleep apnea    CPAP   Past Surgical History:  Procedure Laterality Date  . COLONOSCOPY N/A 04/05/2015   Procedure: COLONOSCOPY;  Surgeon: NajRogene HoustonD;  Location: [facility];  Service: Endoscopy;  Laterality: N/A;  730  . DENTAL SURGERY    . ESOPHAGOGASTRODUODENOSCOPY N/A 01/21/2016   Procedure: ESOPHAGOGASTRODUODENOSCOPY (EGD);  Surgeon: NajRogene HoustonD;  Location: [facility];  Service: Endoscopy;  Laterality: N/A;  11:15  . REFRACTIVE SURGERY  2011   Bilateral, two seperate occasions first in 2006  . REMOVAL OF IMPLANT    . RETINAL DETACHMENT SURGERY Bilateral 05/2005    Family History  Problem Relation Age of Onset  . Hypertension Mother   . Diabetes Mother   . Kidney failure Mother   . Stroke Mother   . Kidney failure Father   . Diabetes Father   . Heart attack Father   . COPD Sister   . Diabetes Brother   . Diabetes Brother   . Diabetes Brother   . Diabetes Brother   . Diabetes Brother   . Hypertension Brother    Social History   Occupational History  . Occupation: Employed    Employer: [employer]  Tobacco Use  . Smoking status: Never Smoker  . Smokeless tobacco: Never Used  Substance and Sexual Activity  . Alcohol use: No  . Drug use: No  . Sexual activity: Yes    Birth control/protection: None, Implant    Tobacco Counseling Counseling given: Not Answered   Immunizations and Health Maintenance Immunization History  Administered Date(s) Administered  . Influenza Split 08/14/2014  . Influenza,inj,Quad PF,6+ Mos 08/02/2018, 07/14/2019  . Pneumococcal  Conjugate-13 02/01/2015  . Pneumococcal Polysaccharide-23 05/23/2005, 03/28/2016  . Tdap 10/08/2013  . Zoster Recombinat (Shingrix) 10/31/2018, 04/28/2019   Health Maintenance Due  Topic Date Due  . FOOT EXAM  06/28/2019    Activities of Daily Living No flowsheet data found.  Physical Exam   (optional), or other factors deemed appropriate based on the beneficiary's medical and social history and current clinical standards.  Advanced Directives:      Assessment:    This is a routine wellness examination for this patient .   Vision/Hearing screen No exam data present  Dietary issues and exercise activities discussed:  Goals   None    Depression Screen PHQ 2/9 Scores 09/24/2019 06/16/2019 04/21/2019 01/22/2019  PHQ - 2 Score 0 0 0 0  PHQ- 9 Score - - - -     Fall Risk Fall Risk  09/30/2019  Falls in the past year? 0  Number falls in past yr: 0  Injury with Fall? 0    Cognitive Function:        Patient Care Team: Fayrene Helper, MD as PCP - General Herminio Commons, MD as PCP - Cardiology (Cardiology) Leo Grosser, Seymour Bars, MD (Inactive) as Consulting Physician (Obstetrics and Gynecology)     Plan:      1. Encounter for Medicare annual wellness exam  - POCT URINALYSIS DIP (CLINITEK)  I have personally reviewed and noted the following in the patient's chart:   . Medical and social history . Use of alcohol, tobacco or illicit drugs  . Current medications and supplements . Functional ability and status . Nutritional status . Physical activity . Advanced directives . List of other physicians . Hospitalizations, surgeries, and ER visits in previous 12 months . Vitals . Screenings to include cognitive, depression, and falls . Referrals and appointments  In addition, I have reviewed and discussed with patient certain preventive protocols, quality metrics, and best practice recommendations. A written personalized care plan for preventive services  as well as general preventive health recommendations were provided to patient.      I provided 20 minutes of non-face-to-face time during this encounter.   Philis Pique Aiyanah Kalama, CMA 10/07/2019

## 2019-10-12 ENCOUNTER — Encounter: Payer: Self-pay | Admitting: Family Medicine

## 2019-10-12 NOTE — Assessment & Plan Note (Signed)
  Patient re-educated about  the importance of commitment to a  minimum of 150 minutes of exercise per week as able.  The importance of healthy food choices with portion control discussed, as well as eating regularly and within a 12 hour window most days. The need to choose "clean , green" food 50 to 75% of the time is discussed, as well as to make water the primary drink and set a goal of 64 ounces water daily.    Weight /BMI 10/07/2019 09/30/2019 09/24/2019  WEIGHT 316 lb 0.6 oz 318 lb 320 lb 1.9 oz  HEIGHT 5\' 5"  5\' 5"  5\' 5"   BMI 52.59 kg/m2 52.92 kg/m2 53.27 kg/m2

## 2019-10-12 NOTE — Assessment & Plan Note (Signed)
Controlled, no change in medication DASH diet and commitment to daily physical activity for a minimum of 30 minutes discussed and encouraged, as a part of hypertension management. The importance of attaining a healthy weight is also discussed.  BP/Weight 10/07/2019 09/30/2019 09/24/2019 07/02/2019 06/16/2019 05/12/2019 123XX123  Systolic BP 123XX123 0000000 [id number] Q000111Q -  Diastolic BP 68 87 84 75 69 67 -  Wt. (Lbs) 316.04 318 320.12 313 313 314 313  BMI 52.59 52.92 53.27 52.09 52.09 52.25 52.09

## 2019-10-12 NOTE — Assessment & Plan Note (Signed)
Hyperlipidemia:Low fat diet discussed and encouraged.   Lipid Panel  Lab Results  Component Value Date   CHOL 202 (H) 09/02/2019   HDL 49 (L) 09/02/2019   LDLCALC 131 (H) 09/02/2019   TRIG 108 09/02/2019   CHOLHDL 4.1 09/02/2019   Uncontrolled and not at goal, needs to consistentlyu take medication and reduce fat intake Updated lab needed at/ before next visit.

## 2019-10-12 NOTE — Assessment & Plan Note (Signed)
Controlled, no change in medication  

## 2019-10-12 NOTE — Progress Notes (Signed)
Virtual Visit via Telephone Note  I connected with Kendra Hanson on 10/12/19 at  2:40 PM EST by telephone and verified that I am speaking with the correct person using two identifiers.  Location: Patient: home Provider:office   I discussed the limitations, risks, security and privacy concerns of performing an evaluation and management service by telephone and the availability of in person appointments. I also discussed with the patient that there may be a patient responsible charge related to this service. The patient expressed understanding and agreed to proceed.   History of Present Illness: F/u chronic problems, doing fairly well overall. No new complaints, blood sugar remains uncontrolled , being followed by Endo. Weight and weight loss remain a challengs, she is however working on this Denies recent fever or chills. Denies sinus pressure, nasal congestion, ear pain or sore throat. Denies chest congestion, productive cough or wheezing. Denies chest pains, palpitations and leg swelling Denies abdominal pain, nausea, vomiting,diarrhea or constipation.   Denies dysuria, frequency, hesitancy or incontinence. Denies uncontrolled  joint pain, swelling and limitation in mobility. Denies headaches, seizures, numbness, or tingling. Denies depression, anxiety or insomnia. Denies skin break down or rash.       Observations/Objective: BP 138/87   Ht 5\' 5"  (1.651 m)   Wt (!) 318 lb (144.2 kg)   BMI 52.92 kg/m  Good communication with no confusion and intact memory. Alert and oriented x 3 No signs of respiratory distress during speech    Assessment and Plan: Essential hypertension, benign Controlled, no change in medication DASH diet and commitment to daily physical activity for a minimum of 30 minutes discussed and encouraged, as a part of hypertension management. The importance of attaining a healthy weight is also discussed.  BP/Weight 10/07/2019 09/30/2019 09/24/2019  07/02/2019 06/16/2019 05/12/2019 123XX123  Systolic BP 123XX123 0000000 [id number] 123XX123 Q000111Q -  Diastolic BP 68 87 84 75 69 67 -  Wt. (Lbs) 316.04 318 320.12 313 313 314 313  BMI 52.59 52.92 53.27 52.09 52.09 52.25 52.09       Uncontrolled type 2 diabetes mellitus with stage 3 chronic kidney disease ([place]) Kendra Hanson is reminded of the importance of commitment to daily physical activity for 30 minutes or more, as able and the need to limit carbohydrate intake to 30 to 60 grams per meal to help with blood sugar control.   The need to take medication as prescribed, test blood sugar as directed, and to call between visits if there is a concern that blood sugar is uncontrolled is also discussed.   Kendra Hanson is reminded of the importance of daily foot exam, annual eye examination, and good blood sugar, blood pressure and cholesterol control.  Diabetic Labs Latest Ref Rng & Units 09/02/2019 06/04/2019 04/17/2019 02/19/2019 10/26/2018  HbA1c <5.7 % of total Hgb 8.8(H) 8.4(H) - 8.6(H) 7.4(H)  Microalbumin Not Estab. ug/mL - - - - -  Micro/Creat Ratio 0 - 29 mg/g creat - - - 14 -  Chol <200 mg/dL 202(H) - 247(H) 263(H) 175  HDL > OR = 50 mg/dL 49(L) - 52 54 53  Calc LDL mg/dL (calc) 131(H) - 176(H) 187(H) 105(H)  Triglycerides <150 mg/dL 108 - 93 108 87  Creatinine 0.50 - 1.05 mg/dL 1.48(H) 1.44(H) 1.56(H) 1.48(H) 1.40(H)   BP/Weight 10/07/2019 09/30/2019 09/24/2019 07/02/2019 06/16/2019 05/12/2019 123XX123  Systolic BP 123XX123 0000000 [id number] Q000111Q -  Diastolic BP 68 87 84 75 69 67 -  Wt. (Lbs) 316.04 318 320.12 313 313 314  313  BMI 52.59 52.92 53.27 52.09 52.09 52.25 52.09   Foot/eye exam completion dates Latest Ref Rng & Units 08/11/2019 06/25/2018  Eye Exam No Retinopathy Retinopathy(A) -  Foot Form Completion - - Done   Closer attention to diet neded, managed by Endo     Morbid obesity  Patient re-educated about  the importance of commitment to a  minimum of 150 minutes of exercise per week as  able.  The importance of healthy food choices with portion control discussed, as well as eating regularly and within a 12 hour window most days. The need to choose "clean , green" food 50 to 75% of the time is discussed, as well as to make water the primary drink and set a goal of 64 ounces water daily.    Weight /BMI 10/07/2019 09/30/2019 09/24/2019  WEIGHT 316 lb 0.6 oz 318 lb 320 lb 1.9 oz  HEIGHT 5\' 5"  5\' 5"  5\' 5"   BMI 52.59 kg/m2 52.92 kg/m2 53.27 kg/m2      Seasonal allergies Controlled, no change in medication   Mixed hyperlipidemia Hyperlipidemia:Low fat diet discussed and encouraged.   Lipid Panel  Lab Results  Component Value Date   CHOL 202 (H) 09/02/2019   HDL 49 (L) 09/02/2019   LDLCALC 131 (H) 09/02/2019   TRIG 108 09/02/2019   CHOLHDL 4.1 09/02/2019   Uncontrolled and not at goal, needs to consistentlyu take medication and reduce fat intake Updated lab needed at/ before next visit.       Follow Up Instructions:    I discussed the assessment and treatment plan with the patient. The patient was provided an opportunity to ask questions and all were answered. The patient agreed with the plan and demonstrated an understanding of the instructions.   The patient was advised to call back or seek an in-person evaluation if the symptoms worsen or if the condition fails to improve as anticipated.  I provided 25 minutes of non-face-to-face time during this encounter.   Tula Nakayama, MD

## 2019-10-12 NOTE — Assessment & Plan Note (Signed)
Kendra Hanson is reminded of the importance of commitment to daily physical activity for 30 minutes or more, as able and the need to limit carbohydrate intake to 30 to 60 grams per meal to help with blood sugar control.   The need to take medication as prescribed, test blood sugar as directed, and to call between visits if there is a concern that blood sugar is uncontrolled is also discussed.   Kendra Hanson is reminded of the importance of daily foot exam, annual eye examination, and good blood sugar, blood pressure and cholesterol control.  Diabetic Labs Latest Ref Rng & Units 09/02/2019 06/04/2019 04/17/2019 02/19/2019 10/26/2018  HbA1c <5.7 % of total Hgb 8.8(H) 8.4(H) - 8.6(H) 7.4(H)  Microalbumin Not Estab. ug/mL - - - - -  Micro/Creat Ratio 0 - 29 mg/g creat - - - 14 -  Chol <200 mg/dL 202(H) - 247(H) 263(H) 175  HDL > OR = 50 mg/dL 49(L) - 52 54 53  Calc LDL mg/dL (calc) 131(H) - 176(H) 187(H) 105(H)  Triglycerides <150 mg/dL 108 - 93 108 87  Creatinine 0.50 - 1.05 mg/dL 1.48(H) 1.44(H) 1.56(H) 1.48(H) 1.40(H)   BP/Weight 10/07/2019 09/30/2019 09/24/2019 07/02/2019 06/16/2019 05/12/2019 123XX123  Systolic BP 123XX123 0000000 [id number] [id number] 123XX123 Q000111Q -  Diastolic BP 68 87 84 75 69 67 -  Wt. (Lbs) 316.04 318 320.12 313 313 314 313  BMI 52.59 52.92 53.27 52.09 52.09 52.25 52.09   Foot/eye exam completion dates Latest Ref Rng & Units 08/11/2019 06/25/2018  Eye Exam No Retinopathy Retinopathy(A) -  Foot Form Completion - - Done   Closer attention to diet neded, managed by Endo

## 2019-10-13 ENCOUNTER — Encounter: Payer: Self-pay | Admitting: Family Medicine

## 2019-10-14 ENCOUNTER — Other Ambulatory Visit: Payer: Self-pay

## 2019-10-14 MED ORDER — ROSUVASTATIN CALCIUM 10 MG PO TABS: 10.0000 mg | ORAL_TABLET | Freq: Every day | ORAL | 1 refills | Status: DC

## 2019-10-22 DIAGNOSIS — Z780 Asymptomatic menopausal state: Secondary | ICD-10-CM | POA: Diagnosis not present

## 2019-10-22 DIAGNOSIS — I10 Essential (primary) hypertension: Secondary | ICD-10-CM | POA: Diagnosis not present

## 2019-10-22 DIAGNOSIS — E1121 Type 2 diabetes mellitus with diabetic nephropathy: Secondary | ICD-10-CM | POA: Diagnosis not present

## 2019-11-03 ENCOUNTER — Telehealth: Payer: Self-pay | Admitting: *Deleted

## 2019-11-03 ENCOUNTER — Other Ambulatory Visit: Payer: Self-pay

## 2019-11-03 MED ORDER — DILTIAZEM HCL ER BEADS 360 MG PO CP24: 360.0000 mg | ORAL_CAPSULE | Freq: Every day | ORAL | 0 refills | Status: DC

## 2019-11-03 NOTE — Telephone Encounter (Signed)
14 day supply sent to local pharmacy

## 2019-11-03 NOTE — Telephone Encounter (Signed)
Pt needs diltiazem sent to [facility] on [address] her mail order is delayed and she is going to be out of the medication today.

## 2019-11-04 ENCOUNTER — Other Ambulatory Visit: Payer: Self-pay | Admitting: Family Medicine

## 2019-11-06 ENCOUNTER — Encounter: Payer: Self-pay | Admitting: Cardiovascular Disease

## 2019-11-06 ENCOUNTER — Telehealth (INDEPENDENT_AMBULATORY_CARE_PROVIDER_SITE_OTHER): Payer: Medicare Other | Admitting: Cardiovascular Disease

## 2019-11-06 VITALS — BP 133/80 | HR 75 | Ht 65.0 in | Wt 320.0 lb

## 2019-11-06 DIAGNOSIS — I10 Essential (primary) hypertension: Secondary | ICD-10-CM

## 2019-11-06 DIAGNOSIS — I5032 Chronic diastolic (congestive) heart failure: Secondary | ICD-10-CM

## 2019-11-06 DIAGNOSIS — R002 Palpitations: Secondary | ICD-10-CM

## 2019-11-06 DIAGNOSIS — E782 Mixed hyperlipidemia: Secondary | ICD-10-CM

## 2019-11-06 NOTE — Patient Instructions (Signed)
Medication Instructions:  Your physician recommends that you continue on your current medications as directed. Please refer to the Current Medication list given to you today.  *If you need a refill on your cardiac medications before your next appointment, please call your pharmacy*  Lab Work: Your physician recommends that you continue on your current medications as directed. Please refer to the Current Medication list given to you today.  If you have labs (blood work) drawn today and your tests are completely normal, you will receive your results only by: Marland Kitchen MyChart Message (if you have MyChart) OR . A paper copy in the mail If you have any lab test that is abnormal or we need to change your treatment, we will call you to review the results.  Testing/Procedures: None today  Follow-Up: At [facility], you and your health needs are our priority.  As part of our continuing mission to provide you with exceptional heart care, we have created designated Provider Care Teams.  These Care Teams include your primary Cardiologist (physician) and Advanced Practice Providers (APPs -  Physician Assistants and Nurse Practitioners) who all work together to provide you with the care you need, when you need it.  Your next appointment:   12 month(s)  The format for your next appointment:   Virtual Visit   Provider:   Kate Sable, MD  Other Instructions None   Thank you for choosing [facility] !

## 2019-11-06 NOTE — Progress Notes (Signed)
Virtual Visit via Telephone Note   This visit type was conducted due to national recommendations for restrictions regarding the COVID-19 Pandemic (e.g. social distancing) in an effort to limit this patient's exposure and mitigate transmission in our community.  Due to her co-morbid illnesses, this patient is at least at moderate risk for complications without adequate follow up.  This format is felt to be most appropriate for this patient at this time.  The patient did not have access to video technology/had technical difficulties with video requiring transitioning to audio format only (telephone).  All issues noted in this document were discussed and addressed.  No physical exam could be performed with this format.  Please refer to the patient's chart for her  consent to telehealth for [facility].   Date:  11/06/2019   ID:  Kendra Hanson, DOB [birth year], MRN [id number]  Patient Location: Home Provider Location: Office  PCP:  Fayrene Helper, MD  Cardiologist:  Kate Sable, MD  Electrophysiologist:  None   Evaluation Performed:  Follow-Up Visit  Chief Complaint: Chronic diastolic heart failure  History of Present Illness:    Kendra Hanson is a 58 y.o. female with history of chronic diastolic heart failure and palpitations.  Dr. Theador Hawthorne is her nephrologist. She has occasional fatigue and shortness of breath and has gained weight during the pandemic. She denies chest pain. She only has palpitations when her blood sugar is low in the mornings.  I reviewed several labs including elevated hemoglobin A1c of 8.8% and creatinine 1.48 on 09/02/2019.  Lipids reviewed below.   Past Medical History:  Diagnosis Date  . Anemia   . Fibroids    Uterine  . GERD (gastroesophageal reflux disease)   . Hyperlipidemia 2008   Lipid profile in 04/2011:136, 53, 43  . Hypertension 2008   Normal CBC and CMet in 2012; negative stress nuclear in 2006- patient asymptomatic  . Insulin  dependent diabetes mellitus 1996  . Multiple allergies    perennial  . Normocytic normochromic anemia 12/27/2015  . Obesity   . PONV (postoperative nausea and vomiting)   . Sleep apnea    CPAP   Past Surgical History:  Procedure Laterality Date  . COLONOSCOPY N/A 04/05/2015   Procedure: COLONOSCOPY;  Surgeon: Rogene Houston, MD;  Location: [facility];  Service: Endoscopy;  Laterality: N/A;  730  . DENTAL SURGERY    . ESOPHAGOGASTRODUODENOSCOPY N/A 01/21/2016   Procedure: ESOPHAGOGASTRODUODENOSCOPY (EGD);  Surgeon: Rogene Houston, MD;  Location: [facility];  Service: Endoscopy;  Laterality: N/A;  11:15  . REFRACTIVE SURGERY  2011   Bilateral, two seperate occasions first in 2006  . REMOVAL OF IMPLANT    . RETINAL DETACHMENT SURGERY Bilateral 05/2005     Current Meds  Medication Sig  . ACCU-CHEK GUIDE test strip USE TO TEST FOUR TIMES DAILY AS DIRECTED.  Marland Kitchen azelastine (ASTELIN) 0.1 % nasal spray Place 2 sprays into both nostrils 2 (two) times daily. Use in each nostril as directed  . benzonatate (TESSALON) 100 MG capsule Take 1 capsule (100 mg total) by mouth 2 (two) times daily as needed for cough.  . blood glucose meter kit and supplies Three times daily testing dx e11.65 (dispense formulary preferred Accuchek compact plus)  . blood glucose meter kit and supplies Three times daily testing DX E11.65 Dispense True Metric meter per formulary  . blood glucose meter kit and supplies Dispense based on patient and insurance preference. Use up to four times  daily as directed. (FOR ICD-10 E11.65)  . Blood Glucose Monitoring Suppl (ACCU-CHEK GUIDE ME) w/Device KIT 1 Piece by Does not apply route as directed.  . Blood Glucose Monitoring Suppl (FREESTYLE LITE) DEVI   . cetirizine (ZYRTEC) 10 MG tablet Take 10 mg by mouth daily.  . cyclobenzaprine (FLEXERIL) 10 MG tablet Take 1 tablet (10 mg total) by mouth 3 (three) times daily as needed for muscle spasms.  Marland Kitchen diltiazem (CARDIZEM CD) 360 MG  24 hr capsule TAKE 1 CAPSULE BY MOUTH DAILY  . diltiazem (TIAZAC) 360 MG 24 hr capsule Take 1 capsule (360 mg total) by mouth daily.  Mariane Baumgarten Calcium (STOOL SOFTENER PO) Take by mouth daily.  . ergocalciferol (VITAMIN D2) 1.25 MG (50000 UT) capsule Take 1 capsule (50,000 Units total) by mouth once a week. One capsule once weekly  . estradiol (VIVELLE-DOT) 0.0375 MG/24HR Place 1 patch onto the skin 3 (three) times a week. Monday, Wednesday, and Saturday.  . ferrous sulfate 325 (65 FE) MG tablet Take 325 mg by mouth daily with breakfast.  . fluticasone (FLONASE) 50 MCG/ACT nasal spray Place 2 sprays into both nostrils daily.  Marland Kitchen glipiZIDE (GLUCOTROL XL) 5 MG 24 hr tablet TAKE 1 TABLET BY MOUTH  TWICE DAILY WITH MEALS  . GLOBAL EASE INJECT PEN NEEDLES 31G X 8 MM MISC USE UP TO 5 TIMES A DAY AS DIRECTED.  Marland Kitchen HUMULIN 70/30 KWIKPEN (70-30) 100 UNIT/ML PEN INJECT 40 UNITS INTO THE  SKIN TWICE DAILY BEFORE A  MEAL  . Lancets (FREESTYLE) lancets Use as instructed three times daily dx e11.65  . metolazone (ZAROXOLYN) 2.5 MG tablet TAKE 1 TABLET BY MOUTH  DAILY  . montelukast (SINGULAIR) 10 MG tablet Take 1 tablet (10 mg total) by mouth at bedtime.  . ondansetron (ZOFRAN) 4 MG tablet Take 1 tablet (4 mg total) by mouth 2 (two) times daily as needed for nausea or vomiting.  . pantoprazole (PROTONIX) 20 MG tablet TAKE 1 TABLET BY MOUTH  DAILY  . potassium chloride (K-DUR) 10 MEQ tablet Take 1 tablet (10 mEq total) by mouth 3 (three) times daily.  . potassium chloride (KLOR-CON) 10 MEQ tablet TAKE 1 TABLET BY MOUTH 3  TIMES DAILY  . progesterone (PROMETRIUM) 100 MG capsule Take 100 mg by mouth daily.  . rosuvastatin (CRESTOR) 10 MG tablet Take 1 tablet (10 mg total) by mouth daily.  . Semaglutide, 1 MG/DOSE, (OZEMPIC, 1 MG/DOSE,) 2 MG/1.5ML SOPN Inject 0.5 mg into the skin once a week.  . torsemide (DEMADEX) 20 MG tablet Take one tab by mouth (24m) every other day, alternating with two tabs (435m every  other day  . UNABLE TO FIND Diabetes shoes x 1 and inserts x 3 Dx:E11.29  . UNABLE TO FIND Compression hose (knee high) 15-2058m pair Dx bilateral leg swelling  . UNABLE TO FIND Diabetic shoes x 1 inserts x 3  Dx E11.9  . vitamin B-12 (CYANOCOBALAMIN) 100 MCG tablet Take 100 mcg by mouth daily.     Allergies:   Nsaids, Diclofenac, and Oxycodone   Social History   Tobacco Use  . Smoking status: Never Smoker  . Smokeless tobacco: Never Used  Substance Use Topics  . Alcohol use: No  . Drug use: No     Family Hx: The patient's family history includes COPD in her sister; Diabetes in her brother, brother, brother, brother, brother, father, and mother; Heart attack in her father; Hypertension in her brother and mother; Kidney failure in her father  and mother; Stroke in her mother.  ROS:   Please see the history of present illness.     All other systems reviewed and are negative.   Prior CV studies:   The following studies were reviewed today:  Nuclear stress test 10/02/2017:   There was no ST segment deviation noted during stress.  The study is normal.  This is a low risk study.  The left ventricular ejection fraction is normal (55-65%).       Labs/Other Tests and Data Reviewed:    EKG:  An ECG dated 10/07/2019 was personally reviewed today and demonstrated:  Sinus rhythm with baseline artifact and nonspecific ST segment abnormalities.  Recent Labs: 09/02/2019: ALT 8; BUN 26; Creat 1.48; Potassium 4.4; Sodium 140   Recent Lipid Panel Lab Results  Component Value Date/Time   CHOL 202 (H) 09/02/2019 08:46 AM   CHOL 247 (H) 04/17/2019 10:43 AM   TRIG 108 09/02/2019 08:46 AM   HDL 49 (L) 09/02/2019 08:46 AM   HDL 52 04/17/2019 10:43 AM   CHOLHDL 4.1 09/02/2019 08:46 AM   LDLCALC 131 (H) 09/02/2019 08:46 AM    Wt Readings from Last 3 Encounters:  11/06/19 (!) 320 lb (145.2 kg)  10/07/19 (!) 316 lb 0.6 oz (143.4 kg)  09/30/19 (!) 318 lb (144.2 kg)      Objective:    Vital Signs:  BP 133/80   Pulse 75   Ht 5' 5"  (1.651 m)   Wt (!) 320 lb (145.2 kg)   BMI 53.25 kg/m    VITAL SIGNS:  reviewed  ASSESSMENT & PLAN:    1. Chronic diastolicheart failure:Symptomatically stable. Weight is stable.  She takes torsemide and metolazone as prescribed by her nephrologist.  I previously spoke to her at length about ways of improving diastolic function including exercise and weight loss.   2. Hypertension:Controlled.No changes to therapy.  3. Palpitations: Controlled on long-acting diltiazem. No changes.  4. Hypercholesterolemia: Continue rosuvastatin.  Lipids reviewed above.    COVID-19 Education: The signs and symptoms of COVID-19 were discussed with the patient and how to seek care for testing (follow up with PCP or arrange E-visit).  The importance of social distancing was discussed today.  Time:   Today, I have spent 15 minutes with the patient with telehealth technology discussing the above problems.     Medication Adjustments/Labs and Tests Ordered: Current medicines are reviewed at length with the patient today.  Concerns regarding medicines are outlined above.   Tests Ordered: No orders of the defined types were placed in this encounter.   Medication Changes: No orders of the defined types were placed in this encounter.   Follow Up:  Virtual Visit  in 1 year(s)  Signed, Kate Sable, MD  11/06/2019 9:41 AM    [place]

## 2019-11-12 ENCOUNTER — Other Ambulatory Visit: Payer: Self-pay

## 2019-11-12 MED ORDER — HUMULIN 70/30 KWIKPEN (70-30) 100 UNIT/ML ~~LOC~~ SUPN: PEN_INJECTOR | SUBCUTANEOUS | 0 refills | Status: DC

## 2019-11-24 ENCOUNTER — Telehealth: Payer: Self-pay | Admitting: "Endocrinology

## 2019-11-24 NOTE — Telephone Encounter (Signed)
Pt requesting a Dexcom G6 and uses [facility] on [address]

## 2019-11-25 NOTE — Telephone Encounter (Signed)
We will discuss it during her next visit.

## 2019-11-25 NOTE — Telephone Encounter (Signed)
Patient notified

## 2019-12-16 ENCOUNTER — Other Ambulatory Visit: Payer: Self-pay

## 2019-12-16 ENCOUNTER — Ambulatory Visit (INDEPENDENT_AMBULATORY_CARE_PROVIDER_SITE_OTHER): Payer: Medicare Other | Admitting: "Endocrinology

## 2019-12-16 ENCOUNTER — Encounter: Payer: Self-pay | Admitting: "Endocrinology

## 2019-12-16 VITALS — BP 112/64 | HR 76 | Resp 12 | Ht 65.0 in | Wt 328.8 lb

## 2019-12-16 DIAGNOSIS — E782 Mixed hyperlipidemia: Secondary | ICD-10-CM | POA: Diagnosis not present

## 2019-12-16 DIAGNOSIS — N183 Chronic kidney disease, stage 3 unspecified: Secondary | ICD-10-CM

## 2019-12-16 DIAGNOSIS — E1122 Type 2 diabetes mellitus with diabetic chronic kidney disease: Secondary | ICD-10-CM | POA: Diagnosis not present

## 2019-12-16 DIAGNOSIS — E1165 Type 2 diabetes mellitus with hyperglycemia: Secondary | ICD-10-CM | POA: Diagnosis not present

## 2019-12-16 DIAGNOSIS — I10 Essential (primary) hypertension: Secondary | ICD-10-CM | POA: Diagnosis not present

## 2019-12-16 DIAGNOSIS — E559 Vitamin D deficiency, unspecified: Secondary | ICD-10-CM

## 2019-12-16 DIAGNOSIS — IMO0002 Reserved for concepts with insufficient information to code with codable children: Secondary | ICD-10-CM

## 2019-12-16 LAB — POCT GLYCOSYLATED HEMOGLOBIN (HGB A1C): Hemoglobin A1C: 7.5 % — AB (ref 4.0–5.6)

## 2019-12-16 MED ORDER — FREESTYLE LIBRE 14 DAY READER DEVI: 1.0000 | Freq: Once | 0 refills | Status: AC

## 2019-12-16 MED ORDER — FREESTYLE LIBRE 14 DAY SENSOR MISC: 1.0000 | 2 refills | Status: DC

## 2019-12-16 NOTE — Patient Instructions (Signed)
                                     Advice for Weight Management  -For most of us the best way to lose weight is by diet management. Generally speaking, diet management means consuming less calories intentionally which over time brings about progressive weight loss.  This can be achieved more effectively by restricting carbohydrate consumption to the minimum possible.  So, it is critically important to know your numbers: how much calorie you are consuming and how much calorie you need. More importantly, our carbohydrates sources should be unprocessed or minimally processed complex starch food items.   Sometimes, it is important to balance nutrition by increasing protein intake (animal or plant source), fruits, and vegetables.  -Sticking to a routine mealtime to eat 3 meals a day and avoiding unnecessary snacks is shown to have a big role in weight control. Under normal circumstances, the only time we lose real weight is when we are hungry, so allow hunger to take place- hunger means no food between meal times, only water.  It is not advisable to starve.   -It is better to avoid simple carbohydrates including: Cakes, Sweet Desserts, Ice Cream, Soda (diet and regular), Sweet Tea, Candies, Chips, Cookies, Store Bought Juices, Alcohol in Excess of  1-2 drinks a day, Artificial Sweeteners, Doughnuts, Coffee Creamers, "Sugar-free" Products, etc, etc.  This is not a complete list.....    -Consulting with certified diabetes educators is proven to provide you with the most accurate and current information on diet.  Also, you may be  interested in discussing diet options/exchanges , we can schedule a visit with Penny Crumpton, RDN, CDE for individualized nutrition education.  -Exercise: If you are able: 30 -60 minutes a day ,4 days a week, or 150 minutes a week.  The longer the better.  Combine stretch, strength, and aerobic activities.  If you were told in the past that you  have high risk for cardiovascular diseases, you may seek evaluation by your heart doctor prior to initiating moderate to intense exercise programs.                                  Additional Care Considerations for Diabetes   -Diabetes  is a chronic disease.  The most important care consideration is regular follow-up with your diabetes care provider with the goal being avoiding or delaying its complications and to take advantage of advances in medications and technology.    -Type 2 diabetes is known to coexist with other important comorbidities such as high blood pressure and high cholesterol.  It is critical to control not only the diabetes but also the high blood pressure and high cholesterol to minimize and delay the risk of complications including coronary artery disease, stroke, amputations, blindness, etc.    - Studies showed that people with diabetes will benefit from a class of medications known as ACE inhibitors and statins.  Unless there are specific reasons not to be on these medications, the standard of care is to consider getting one from these groups of medications at an optimal doses.  These medications are generally considered safe and proven to help protect the heart and the kidneys.    - People with diabetes are encouraged to initiate and maintain regular follow-up with eye doctors, foot doctors, dentists ,   and if necessary heart and kidney doctors.     - It is highly recommended that people with diabetes quit smoking or stay away from smoking, and get yearly  flu vaccine and pneumonia vaccine at least every 5 years.  One other important lifestyle recommendation is to ensure adequate sleep - at least 6-7 hours of uninterrupted sleep at night.  -Exercise: If you are able: 30 -60 minutes a day, 4 days a week, or 150 minutes a week.  The longer the better.  Combine stretch, strength, and aerobic activities.  If you were told in the past that you have high risk for cardiovascular  diseases, you may seek evaluation by your heart doctor prior to initiating moderate to intense exercise programs.     COVID-19 Vaccine Information can be found at: [email or web address] For questions related to vaccine distribution or appointments, please email [email or web address] or call [phone]        

## 2019-12-16 NOTE — Progress Notes (Signed)
12/16/2019, 7:03 PM    Endocrinology follow-up note   Subjective:    [name], female    DOB: 23-Feb-1961.  Kendra Hanson is seen in follow-up in the management of her currently uncontrolled type 2 diabetes, hypertension, hyperlipidemia.   -PMD:   Kendra Helper, MD.   Past Medical History:  Diagnosis Date  . Anemia   . Fibroids    Uterine  . GERD (gastroesophageal reflux disease)   . Hyperlipidemia 2008   Lipid profile in 04/2011:136, 53, 43  . Hypertension 2008   Normal CBC and CMet in 2012; negative stress nuclear in 2006- patient asymptomatic  . Insulin dependent diabetes mellitus 1996  . Multiple allergies    perennial  . Normocytic normochromic anemia 12/27/2015  . Obesity   . PONV (postoperative nausea and vomiting)   . Sleep apnea    CPAP   Past Surgical History:  Procedure Laterality Date  . COLONOSCOPY N/A 04/05/2015   Procedure: COLONOSCOPY;  Surgeon: Rogene Houston, MD;  Location: [facility];  Service: Endoscopy;  Laterality: N/A;  730  . DENTAL SURGERY    . ESOPHAGOGASTRODUODENOSCOPY N/A 01/21/2016   Procedure: ESOPHAGOGASTRODUODENOSCOPY (EGD);  Surgeon: Rogene Houston, MD;  Location: [facility];  Service: Endoscopy;  Laterality: N/A;  11:15  . REFRACTIVE SURGERY  2011   Bilateral, two seperate occasions first in 2006  . REMOVAL OF IMPLANT    . RETINAL DETACHMENT SURGERY Bilateral 05/2005   Social History   Socioeconomic History  . Marital status: Married    Spouse name: Not on file  . Number of children: Not on file  . Years of education: Not on file  . Highest education level: Not on file  Occupational History  . Occupation: Employed    Employer: [employer]  Tobacco Use  . Smoking status: Never Smoker  . Smokeless tobacco: Never Used  Substance and Sexual Activity  . Alcohol use: No  . Drug use: No  . Sexual activity: Yes    Birth control/protection: None,  Implant  Other Topics Concern  . Not on file  Social History Narrative   Married with 2 children   Social Determinants of Health   Financial Resource Strain: Low Risk   . Difficulty of Paying Living Expenses: Not hard at all  Food Insecurity: No Food Insecurity  . Worried About Charity fundraiser in the Last Year: Never true  . Ran Out of Food in the Last Year: Never true  Transportation Needs: No Transportation Needs  . Lack of Transportation (Medical): No  . Lack of Transportation (Non-Medical): No  Physical Activity: Inactive  . Days of Exercise per Week: 0 days  . Minutes of Exercise per Session: 0 min  Stress: No Stress Concern Present  . Feeling of Stress : Not at all  Social Connections: Not Isolated  . Frequency of Communication with Friends and Family: More than three times a week  . Frequency of Social Gatherings with Friends and Family: Never  . Attends Religious Services: More than 4 times per year  . Active Member of Clubs or Organizations: Yes  . Attends Archivist Meetings: More than 4 times per year  . Marital Status: Married   Outpatient Encounter Medications as of 12/16/2019  Medication Sig  . ACCU-CHEK GUIDE test strip USE TO TEST FOUR TIMES DAILY AS DIRECTED.  Marland Kitchen azelastine (ASTELIN) 0.1 % nasal spray Place 2 sprays into both nostrils 2 (two) times daily. Use in each nostril as directed  . benzonatate (TESSALON) 100 MG capsule Take 1 capsule (100 mg total) by mouth 2 (two) times daily as needed for cough.  . blood glucose meter kit and supplies Three times daily testing dx e11.65 (dispense formulary preferred Accuchek compact plus)  . blood glucose meter kit and supplies Three times daily testing DX E11.65 Dispense True Metric meter per formulary  . blood glucose meter kit and supplies Dispense based on patient and insurance preference. Use up to four times daily as directed. (FOR ICD-10 E11.65)  . Blood Glucose Monitoring Suppl (ACCU-CHEK GUIDE ME)  w/Device KIT 1 Piece by Does not apply route as directed.  . Blood Glucose Monitoring Suppl (FREESTYLE LITE) DEVI   . cetirizine (ZYRTEC) 10 MG tablet Take 10 mg by mouth daily.  . Continuous Blood Gluc Receiver (FREESTYLE LIBRE 14 DAY READER) DEVI 1 each by Does not apply route once for 1 dose.  . Continuous Blood Gluc Sensor (FREESTYLE LIBRE 14 DAY SENSOR) MISC Inject 1 each into the skin every 14 (fourteen) days. Use as directed.  . cyclobenzaprine (FLEXERIL) 10 MG tablet Take 1 tablet (10 mg total) by mouth 3 (three) times daily as needed for muscle spasms.  Marland Kitchen diltiazem (CARDIZEM CD) 360 MG 24 hr capsule TAKE 1 CAPSULE BY MOUTH DAILY  . diltiazem (TIAZAC) 360 MG 24 hr capsule Take 1 capsule (360 mg total) by mouth daily.  Mariane Baumgarten Calcium (STOOL SOFTENER PO) Take by mouth daily.  . ergocalciferol (VITAMIN D2) 1.25 MG (50000 UT) capsule Take 1 capsule (50,000 Units total) by mouth once a week. One capsule once weekly  . ferrous sulfate 325 (65 FE) MG tablet Take 325 mg by mouth daily with breakfast.  . fluticasone (FLONASE) 50 MCG/ACT nasal spray Place 2 sprays into both nostrils daily.  Marland Kitchen glipiZIDE (GLUCOTROL XL) 5 MG 24 hr tablet TAKE 1 TABLET BY MOUTH  TWICE DAILY WITH MEALS  . GLOBAL EASE INJECT PEN NEEDLES 31G X 8 MM MISC USE UP TO 5 TIMES A DAY AS DIRECTED.  . Insulin Isophane & Regular Human (HUMULIN 70/30 KWIKPEN) (70-30) 100 UNIT/ML PEN INJECT 40 UNITS INTO THE  SKIN TWICE DAILY BEFORE A  MEAL  . Lancets (FREESTYLE) lancets Use as instructed three times daily dx e11.65  . metolazone (ZAROXOLYN) 2.5 MG tablet TAKE 1 TABLET BY MOUTH  DAILY  . montelukast (SINGULAIR) 10 MG tablet Take 1 tablet (10 mg total) by mouth at bedtime.  . ondansetron (ZOFRAN) 4 MG tablet Take 1 tablet (4 mg total) by mouth 2 (two) times daily as needed for nausea or vomiting.  . pantoprazole (PROTONIX) 20 MG tablet TAKE 1 TABLET BY MOUTH  DAILY  . potassium chloride (K-DUR) 10 MEQ tablet Take 1 tablet (10 mEq  total) by mouth 3 (three) times daily.  . potassium chloride (KLOR-CON) 10 MEQ tablet TAKE 1 TABLET BY MOUTH 3  TIMES DAILY  . rosuvastatin (CRESTOR) 10 MG tablet Take 1 tablet (10 mg total) by mouth daily.  . Semaglutide, 1 MG/DOSE, (OZEMPIC, 1 MG/DOSE,) 2 MG/1.5ML SOPN Inject 0.5 mg into the skin once a week.  . torsemide (DEMADEX) 20 MG tablet Take one tab by mouth (35m) every other day, alternating with two tabs (446m every other day  . UNABLE TO  FIND Diabetes shoes x 1 and inserts x 3 Dx:E11.29  . UNABLE TO FIND Compression hose (knee high) 15-74m 1 pair Dx bilateral leg swelling  . UNABLE TO FIND Diabetic shoes x 1 inserts x 3  Dx E11.9  . vitamin B-12 (CYANOCOBALAMIN) 100 MCG tablet Take 100 mcg by mouth daily.  . [DISCONTINUED] estradiol (VIVELLE-DOT) 0.0375 MG/24HR Place 1 patch onto the skin 3 (three) times a week. Monday, Wednesday, and Saturday.  . [DISCONTINUED] progesterone (PROMETRIUM) 100 MG capsule Take 100 mg by mouth daily.   No facility-administered encounter medications on file as of 12/16/2019.    ALLERGIES: Allergies  Allergen Reactions  . Nsaids     Kidney function   . Diclofenac Other (See Comments)    hallucination  . Oxycodone     Vomiting     VACCINATION STATUS: Immunization History  Administered Date(s) Administered  . Influenza Split 08/14/2014  . Influenza,inj,Quad PF,6+ Mos 08/02/2018, 07/14/2019  . Pneumococcal Conjugate-13 02/01/2015  . Pneumococcal Polysaccharide-23 05/23/2005, 03/28/2016  . Tdap 10/08/2013  . Zoster Recombinat (Shingrix) 10/31/2018, 04/28/2019    Diabetes She presents for her follow-up diabetic visit. She has type 2 diabetes mellitus. Onset time: She was diagnosed at approximate age of [age]years. Her disease course has been improving. Pertinent negatives for hypoglycemia include no confusion, headaches, pallor, seizures or sweats. Pertinent negatives for diabetes include no chest pain, no fatigue, no polydipsia, no  polyphagia and no polyuria. There are no hypoglycemic complications. Symptoms are improving. Diabetic complications include nephropathy. Risk factors for coronary artery disease include diabetes mellitus, dyslipidemia, hypertension, obesity and sedentary lifestyle. Current diabetic treatment includes insulin injections. Her weight is increasing steadily. She is following a generally unhealthy diet. When asked about meal planning, she reported none. She has not had a previous visit with a dietitian. She never participates in exercise. Her home blood glucose trend is fluctuating minimally. Her breakfast blood glucose range is generally 140-180 mg/dl. Her bedtime blood glucose range is generally 140-180 mg/dl. Her overall blood glucose range is 140-180 mg/dl. (She returns with improved glycemic profile and A1c of 7.5% improving from 8.8%.   He did not document no report hypoglycemia.  ) An ACE inhibitor/angiotensin II receptor blocker is not being taken. Eye exam is current.  Hyperlipidemia This is a chronic problem. The problem is uncontrolled. Exacerbating diseases include diabetes and obesity. Pertinent negatives include no chest pain, myalgias or shortness of breath. Current antihyperlipidemic treatment includes statins. Risk factors for coronary artery disease include dyslipidemia, diabetes mellitus, a sedentary lifestyle, post-menopausal and family history.     Review of systems  Constitutional: + Minimally fluctuating body weight,  current  Body mass index is 54.72 kg/m. , no fatigue, no subjective hyperthermia, no subjective hypothermia Eyes: no blurry vision, no xerophthalmia ENT: no sore throat, no nodules palpated in throat, no dysphagia/odynophagia, no hoarseness Cardiovascular: no Chest Pain, no Shortness of Breath, no palpitations, no leg swelling Respiratory: no cough, no shortness of breath Gastrointestinal: no Nausea/Vomiting/Diarhhea Musculoskeletal: no muscle/joint aches Skin: no  rashes Neurological: no tremors, no numbness, no tingling, no dizziness Psychiatric: no depression, no anxiety   Objective:    BP 112/64   Pulse 76   Resp 12   Ht 5' 5"  (1.651 m)   Wt (!) 328 lb 12.8 oz (149.1 kg)   BMI 54.72 kg/m   Wt Readings from Last 3 Encounters:  12/16/19 (!) 328 lb 12.8 oz (149.1 kg)  11/06/19 (!) 320 lb (145.2 kg)  10/07/19 (!) 316  lb 0.6 oz (143.4 kg)     Physical Exam- Limited  Constitutional:  Body mass index is 54.72 kg/m. , not in acute distress, normal state of mind Eyes:  EOMI, no exophthalmos Neck: Supple Respiratory: Adequate breathing efforts Musculoskeletal: no gross deformities, strength intact in all four extremities, no gross restriction of joint movements Skin:  no rashes, no hyperemia Neurological: no tremor with outstretched hands.   CMP     Component Value Date/Time   NA 140 09/02/2019 0844   NA 141 06/04/2019 1059   K 4.4 09/02/2019 0844   CL 102 09/02/2019 0844   CO2 30 09/02/2019 0844   GLUCOSE 115 (H) 09/02/2019 0844   BUN 26 (H) 09/02/2019 0844   BUN 18 06/04/2019 1059   CREATININE 1.48 (H) 09/02/2019 0844   CALCIUM 9.3 09/02/2019 0844   PROT 7.1 09/02/2019 0846   PROT 7.2 06/04/2019 1059   ALBUMIN 4.1 06/04/2019 1059   AST 13 09/02/2019 0846   ALT 8 09/02/2019 0846   ALKPHOS 114 06/04/2019 1059   BILITOT 0.5 09/02/2019 0846   BILITOT 0.4 06/04/2019 1059   GFRNONAA 38 (L) 09/02/2019 0844   GFRAA 44 (L) 09/02/2019 0844     Diabetic Labs (most recent): Lab Results  Component Value Date   HGBA1C 7.5 (A) 12/16/2019   HGBA1C 8.8 (H) 09/02/2019   HGBA1C 8.4 (H) 06/04/2019    Lipid Panel     Component Value Date/Time   CHOL 202 (H) 09/02/2019 0846   CHOL 247 (H) 04/17/2019 1043   TRIG 108 09/02/2019 0846   HDL 49 (L) 09/02/2019 0846   HDL 52 04/17/2019 1043   CHOLHDL 4.1 09/02/2019 0846   VLDL 15 04/12/2017 1121   LDLCALC 131 (H) 09/02/2019 0846     Assessment & Plan:   1. Uncontrolled type 2  diabetes mellitus with stage 3 chronic kidney disease ([place])  - Kendra Hanson has currently uncontrolled symptomatic type 2 DM since 59 years of age. -She is currently on premixed insulin Humulin 70/30 , reports significantly improved glycemic profile.  Her A1c at point-of-care is 7.5%, improving from 8.8%.   -her diabetes is complicated by stage 3 renal insufficiency and she remains at a high risk for more acute and chronic complications which include CAD, CVA, CKD, retinopathy, and neuropathy. These are all discussed in detail with her.  - I have counseled her on diet management and weight loss, by adopting a carbohydrate restricted/protein rich diet.  - she  admits there is a room for improvement in her diet and drink choices. -  Suggestion is made for her to avoid simple carbohydrates  from her diet including Cakes, Sweet Desserts / Pastries, Ice Cream, Soda (diet and regular), Sweet Tea, Candies, Chips, Cookies, Sweet Pastries,  Store Bought Juices, Alcohol in Excess of  1-2 drinks a day, Artificial Sweeteners, Coffee Creamer, and "Sugar-free" Products. This will help patient to have stable blood glucose profile and potentially avoid unintended weight gain.   - I encouraged her to switch to  unprocessed or minimally processed complex starch and increased protein intake (animal or plant source), fruits, and vegetables.  - she is advised to stick to a routine mealtimes to eat 3 meals  a day and avoid unnecessary snacks ( to snack only to correct hypoglycemia).    - I have approached her with the following individualized plan to manage diabetes and patient agrees:    -She wishes to stay on her premixed insulin.  She also request  a prescription for a CGM device.  I discussed and prescribed the freestyle libre device for her. -She is advised to continue  Humulin 70/30  50 units with breakfast and 45 units with supper , associated with strict monitoring of blood glucose 4 times a day-before  meals and at bedtime return with her meter and logs and repeat labs in 4 weeks.    -  She will continue Ozempic 0.5 mg subcutaneously weekly.  Advised to increase her glipizide 5 mg p.o. with breakfast and 5 mg p.o. with supper.  - she is encouraged to call clinic for fasting blood glucose levels less than 70 or above 200 mg /dl.   - she is not a candidate for metformin, SGLT2 inhibitors due to concurrent renal insufficiency.  - Patient specific target  A1c;  LDL, HDL, Triglycerides, and  Waist Circumference were discussed in detail.  2) Blood Pressure /Hypertension: Her blood pressure is controlled to target.   she is advised to continue her current medications including diltiazem 360 mg p.o. daily with breakfast .  3) Lipids/Hyperlipidemia:   Review of her recent lipid panel showed  uncontrolled  LDL at 131 improving from 187.  Although she is responding to it, she is reporting inconsistency taking her Crestor that she is advised to resume and continue Crestor 10 mg p.o. nightly.  Side effects and precautions discussed with her.      - she is  advised to maintain close follow up with Kendra Helper, MD for primary care needs, as well as her other providers for optimal and coordinated care.  - Time spent on this patient care encounter:  35 min, of which > 50% was spent in  counseling and the rest reviewing her blood glucose logs , discussing her hypoglycemia and hyperglycemia episodes, reviewing her current and  previous labs / studies  ( including abstraction from other facilities) and medications  doses and developing a  long term treatment plan and documenting her care.   Please refer to Patient Instructions for Blood Glucose Monitoring and Insulin/Medications Dosing Guide"  in media tab for additional information. Please  also refer to " Patient Self Inventory" in the Media  tab for reviewed elements of pertinent patient history.  Kendra Hanson participated in the discussions,  expressed understanding, and voiced agreement with the above plans.  All questions were answered to her satisfaction. she is encouraged to contact clinic should she have any questions or concerns prior to her return visit.   Follow up plan: - Return in about 3 months (around 03/14/2020) for Bring Meter and Logs- A1c in Office, Follow up with Pre-visit Labs.  Glade Lloyd, MD [facility] [address] [place], [place] [address] Phone: [phone]  Fax: [phone]    12/16/2019, 7:03 PM  This note was partially dictated with voice recognition software. Similar sounding words can be transcribed inadequately or may not  be corrected upon review.

## 2019-12-23 ENCOUNTER — Other Ambulatory Visit: Payer: Self-pay | Admitting: "Endocrinology

## 2019-12-23 ENCOUNTER — Other Ambulatory Visit (HOSPITAL_COMMUNITY): Payer: Self-pay | Admitting: Obstetrics and Gynecology

## 2019-12-23 ENCOUNTER — Other Ambulatory Visit (HOSPITAL_COMMUNITY): Payer: Self-pay | Admitting: *Deleted

## 2019-12-23 DIAGNOSIS — Z1231 Encounter for screening mammogram for malignant neoplasm of breast: Secondary | ICD-10-CM

## 2019-12-26 DIAGNOSIS — D631 Anemia in chronic kidney disease: Secondary | ICD-10-CM | POA: Diagnosis not present

## 2019-12-26 DIAGNOSIS — N1831 Chronic kidney disease, stage 3a: Secondary | ICD-10-CM | POA: Diagnosis not present

## 2019-12-26 DIAGNOSIS — R809 Proteinuria, unspecified: Secondary | ICD-10-CM | POA: Diagnosis not present

## 2019-12-26 DIAGNOSIS — Z79899 Other long term (current) drug therapy: Secondary | ICD-10-CM | POA: Diagnosis not present

## 2019-12-26 DIAGNOSIS — E559 Vitamin D deficiency, unspecified: Secondary | ICD-10-CM | POA: Diagnosis not present

## 2019-12-29 ENCOUNTER — Other Ambulatory Visit: Payer: Self-pay

## 2019-12-29 ENCOUNTER — Ambulatory Visit (HOSPITAL_COMMUNITY)
Admission: RE | Admit: 2019-12-29 | Discharge: 2019-12-29 | Disposition: A | Discharge status: Home / Self Care | Payer: Medicare Other | Source: Ambulatory Visit | Attending: Obstetrics and Gynecology | Admitting: Obstetrics and Gynecology

## 2019-12-29 DIAGNOSIS — Z1231 Encounter for screening mammogram for malignant neoplasm of breast: Secondary | ICD-10-CM | POA: Diagnosis not present

## 2020-01-02 DIAGNOSIS — I5032 Chronic diastolic (congestive) heart failure: Secondary | ICD-10-CM | POA: Diagnosis not present

## 2020-01-02 DIAGNOSIS — I129 Hypertensive chronic kidney disease with stage 1 through stage 4 chronic kidney disease, or unspecified chronic kidney disease: Secondary | ICD-10-CM | POA: Diagnosis not present

## 2020-01-02 DIAGNOSIS — N189 Chronic kidney disease, unspecified: Secondary | ICD-10-CM | POA: Diagnosis not present

## 2020-01-02 DIAGNOSIS — E211 Secondary hyperparathyroidism, not elsewhere classified: Secondary | ICD-10-CM | POA: Diagnosis not present

## 2020-01-02 DIAGNOSIS — N179 Acute kidney failure, unspecified: Secondary | ICD-10-CM | POA: Diagnosis not present

## 2020-01-14 DIAGNOSIS — N179 Acute kidney failure, unspecified: Secondary | ICD-10-CM | POA: Diagnosis not present

## 2020-01-16 ENCOUNTER — Other Ambulatory Visit: Payer: Self-pay | Admitting: "Endocrinology

## 2020-01-20 ENCOUNTER — Telehealth: Payer: Self-pay | Admitting: "Endocrinology

## 2020-01-20 NOTE — Telephone Encounter (Signed)
Pt given one box of ozempic

## 2020-01-21 ENCOUNTER — Other Ambulatory Visit: Payer: Self-pay | Admitting: "Endocrinology

## 2020-01-21 ENCOUNTER — Other Ambulatory Visit: Payer: Self-pay

## 2020-01-21 DIAGNOSIS — N189 Chronic kidney disease, unspecified: Secondary | ICD-10-CM | POA: Diagnosis not present

## 2020-01-21 DIAGNOSIS — N179 Acute kidney failure, unspecified: Secondary | ICD-10-CM | POA: Diagnosis not present

## 2020-01-21 DIAGNOSIS — E211 Secondary hyperparathyroidism, not elsewhere classified: Secondary | ICD-10-CM | POA: Diagnosis not present

## 2020-01-21 DIAGNOSIS — I129 Hypertensive chronic kidney disease with stage 1 through stage 4 chronic kidney disease, or unspecified chronic kidney disease: Secondary | ICD-10-CM | POA: Diagnosis not present

## 2020-01-21 DIAGNOSIS — I5032 Chronic diastolic (congestive) heart failure: Secondary | ICD-10-CM | POA: Diagnosis not present

## 2020-01-21 MED ORDER — ACCU-CHEK GUIDE VI STRP: ORAL_STRIP | 2 refills | Status: DC

## 2020-01-21 MED ORDER — HUMULIN 70/30 KWIKPEN (70-30) 100 UNIT/ML ~~LOC~~ SUPN: PEN_INJECTOR | SUBCUTANEOUS | 0 refills | Status: DC

## 2020-01-22 ENCOUNTER — Telehealth: Payer: Self-pay | Admitting: "Endocrinology

## 2020-01-22 ENCOUNTER — Other Ambulatory Visit: Payer: Self-pay

## 2020-01-22 MED ORDER — ACCU-CHEK GUIDE VI STRP: ORAL_STRIP | 2 refills | Status: DC

## 2020-01-22 NOTE — Telephone Encounter (Signed)
Rx refill sent with specific instructions to [facility]

## 2020-01-22 NOTE — Telephone Encounter (Signed)
[facility] called and said the test strips have to say how many times the pt is testing. They are canceling the RX and will need new one. Thanks

## 2020-01-28 DIAGNOSIS — Z23 Encounter for immunization: Secondary | ICD-10-CM | POA: Diagnosis not present

## 2020-02-03 DIAGNOSIS — N183 Chronic kidney disease, stage 3 unspecified: Secondary | ICD-10-CM | POA: Diagnosis not present

## 2020-02-03 DIAGNOSIS — E1122 Type 2 diabetes mellitus with diabetic chronic kidney disease: Secondary | ICD-10-CM | POA: Diagnosis not present

## 2020-02-03 DIAGNOSIS — E1165 Type 2 diabetes mellitus with hyperglycemia: Secondary | ICD-10-CM | POA: Diagnosis not present

## 2020-02-04 LAB — COMPLETE METABOLIC PANEL WITH GFR
AG Ratio: 1.3 (calc) (ref 1.0–2.5)
ALT: 12 U/L (ref 6–29)
AST: 14 U/L (ref 10–35)
Albumin: 4.1 g/dL (ref 3.6–5.1)
Alkaline phosphatase (APISO): 100 U/L (ref 37–153)
BUN/Creatinine Ratio: 23 (calc) — ABNORMAL HIGH (ref 6–22)
BUN: 36 mg/dL — ABNORMAL HIGH (ref 7–25)
CO2: 32 mmol/L (ref 20–32)
Calcium: 9.3 mg/dL (ref 8.6–10.4)
Chloride: 99 mmol/L (ref 98–110)
Creat: 1.56 mg/dL — ABNORMAL HIGH (ref 0.50–1.05)
GFR, Est African American: 42 mL/min/{1.73_m2} — ABNORMAL LOW (ref >=60)
GFR, Est Non African American: 36 mL/min/{1.73_m2} — ABNORMAL LOW (ref >=60)
Globulin: 3.2 g/dL (calc) (ref 1.9–3.7)
Glucose, Bld: 141 mg/dL — ABNORMAL HIGH (ref 65–139)
Potassium: 4.1 mmol/L (ref 3.5–5.3)
Sodium: 140 mmol/L (ref 135–146)
Total Bilirubin: 0.6 mg/dL (ref 0.2–1.2)
Total Protein: 7.3 g/dL (ref 6.1–8.1)

## 2020-02-04 LAB — MICROALBUMIN / CREATININE URINE RATIO
Creatinine, Urine: 129 mg/dL (ref 20–275)
Microalb Creat Ratio: 15 mcg/mg creat (ref <30)
Microalb, Ur: 1.9 mg/dL

## 2020-02-04 LAB — T4, FREE: Free T4: 1.3 ng/dL (ref 0.8–1.8)

## 2020-02-04 LAB — VITAMIN D 25 HYDROXY (VIT D DEFICIENCY, FRACTURES): Vit D, 25-Hydroxy: 62 ng/mL (ref 30–100)

## 2020-02-04 LAB — TSH: TSH: 2.22 mIU/L (ref 0.40–4.50)

## 2020-02-25 DIAGNOSIS — Z23 Encounter for immunization: Secondary | ICD-10-CM | POA: Diagnosis not present

## 2020-03-04 ENCOUNTER — Encounter: Payer: Self-pay | Admitting: Ophthalmology

## 2020-03-04 DIAGNOSIS — H2511 Age-related nuclear cataract, right eye: Secondary | ICD-10-CM | POA: Diagnosis not present

## 2020-03-04 DIAGNOSIS — H2513 Age-related nuclear cataract, bilateral: Secondary | ICD-10-CM | POA: Diagnosis not present

## 2020-03-04 DIAGNOSIS — H25013 Cortical age-related cataract, bilateral: Secondary | ICD-10-CM | POA: Diagnosis not present

## 2020-03-04 DIAGNOSIS — H40023 Open angle with borderline findings, high risk, bilateral: Secondary | ICD-10-CM | POA: Diagnosis not present

## 2020-03-04 DIAGNOSIS — H35033 Hypertensive retinopathy, bilateral: Secondary | ICD-10-CM | POA: Diagnosis not present

## 2020-03-15 ENCOUNTER — Other Ambulatory Visit: Payer: Self-pay

## 2020-03-15 ENCOUNTER — Encounter (INDEPENDENT_AMBULATORY_CARE_PROVIDER_SITE_OTHER): Payer: Self-pay | Admitting: Ophthalmology

## 2020-03-15 ENCOUNTER — Ambulatory Visit (INDEPENDENT_AMBULATORY_CARE_PROVIDER_SITE_OTHER): Payer: Medicare Other | Admitting: Ophthalmology

## 2020-03-15 DIAGNOSIS — H35072 Retinal telangiectasis, left eye: Secondary | ICD-10-CM | POA: Insufficient documentation

## 2020-03-15 DIAGNOSIS — E113551 Type 2 diabetes mellitus with stable proliferative diabetic retinopathy, right eye: Secondary | ICD-10-CM | POA: Insufficient documentation

## 2020-03-15 DIAGNOSIS — H3562 Retinal hemorrhage, left eye: Secondary | ICD-10-CM

## 2020-03-15 DIAGNOSIS — H2513 Age-related nuclear cataract, bilateral: Secondary | ICD-10-CM | POA: Diagnosis not present

## 2020-03-15 DIAGNOSIS — H3561 Retinal hemorrhage, right eye: Secondary | ICD-10-CM

## 2020-03-15 DIAGNOSIS — E113552 Type 2 diabetes mellitus with stable proliferative diabetic retinopathy, left eye: Secondary | ICD-10-CM | POA: Diagnosis not present

## 2020-03-15 DIAGNOSIS — H35352 Cystoid macular degeneration, left eye: Secondary | ICD-10-CM

## 2020-03-15 DIAGNOSIS — G4733 Obstructive sleep apnea (adult) (pediatric): Secondary | ICD-10-CM

## 2020-03-15 HISTORY — DX: Age-related nuclear cataract, bilateral: H25.13

## 2020-03-15 NOTE — Progress Notes (Signed)
03/15/2020     CHIEF COMPLAINT Patient presents for Retina Evaluation   HISTORY OF PRESENT ILLNESS: Kendra Hanson is a 59 y.o. female who presents to the clinic today for:   HPI    Retina Evaluation    In left eye.  Associated Symptoms Negative for Flashes and Floaters.  Context:  distance vision and near vision.  Treatments tried include no treatments.          Comments    Patient referred by Dr. Herbert Deaner for retinal eval for clearance on Cat sx OS. Patient states that her vision is not good in her left eye. Patient states that she has a CPAP but is not using it (tries every now and then).        Last edited by Gerda Diss on 03/15/2020  9:26 AM. (History)      Referring physician: Fayrene Helper, MD [address] [place],  [place] [address]  HISTORICAL INFORMATION:   Selected notes from the MEDICAL RECORD NUMBER    Lab Results  Component Value Date   HGBA1C 7.5 (A) 12/16/2019     CURRENT MEDICATIONS: Current Outpatient Medications (Ophthalmic Drugs)  Medication Sig  . atropine 1 % ophthalmic solution   . brimonidine (ALPHAGAN) 0.2 % ophthalmic solution Place 1 drop into the right eye 3 (three) times daily.  Marland Kitchen ketorolac (ACULAR) 0.5 % ophthalmic solution   . ofloxacin (OCUFLOX) 0.3 % ophthalmic solution 1 drop 4 (four) times daily.  . prednisoLONE acetate (PRED FORTE) 1 % ophthalmic suspension Place 1 drop into the right eye 4 (four) times daily.   No current facility-administered medications for this visit. (Ophthalmic Drugs)   Current Outpatient Medications (Other)  Medication Sig  . azelastine (ASTELIN) 0.1 % nasal spray Place 2 sprays into both nostrils 2 (two) times daily. Use in each nostril as directed  . benzonatate (TESSALON) 100 MG capsule Take 1 capsule (100 mg total) by mouth 2 (two) times daily as needed for cough.  . Blood Glucose Monitoring Suppl (ACCU-CHEK GUIDE ME) w/Device KIT 1 Piece by Does not apply route as directed.  .  Blood Glucose Monitoring Suppl (FREESTYLE LITE) DEVI   . cetirizine (ZYRTEC) 10 MG tablet Take 10 mg by mouth daily.  . Continuous Blood Gluc Sensor (FREESTYLE LIBRE 14 DAY SENSOR) MISC Inject 1 each into the skin every 14 (fourteen) days. Use as directed.  . cyclobenzaprine (FLEXERIL) 10 MG tablet Take 1 tablet (10 mg total) by mouth 3 (three) times daily as needed for muscle spasms.  Marland Kitchen diltiazem (CARDIZEM CD) 360 MG 24 hr capsule TAKE 1 CAPSULE BY MOUTH DAILY  . diltiazem (TIAZAC) 360 MG 24 hr capsule TAKE 1 CAPSULE BY MOUTH  DAILY  . Docusate Calcium (STOOL SOFTENER PO) Take by mouth daily.  . ergocalciferol (VITAMIN D2) 1.25 MG (50000 UT) capsule Take 1 capsule (50,000 Units total) by mouth once a week. One capsule once weekly  . ferrous sulfate 325 (65 FE) MG tablet Take 325 mg by mouth daily with breakfast.  . fluticasone (FLONASE) 50 MCG/ACT nasal spray Place 2 sprays into both nostrils daily.  Marland Kitchen glipiZIDE (GLUCOTROL XL) 5 MG 24 hr tablet TAKE 1 TABLET BY MOUTH  TWICE DAILY WITH MEALS  . GLOBAL EASE INJECT PEN NEEDLES 31G X 8 MM MISC USE UP TO 5 TIMES A DAY AS DIRECTED.  Marland Kitchen glucose blood (ACCU-CHEK GUIDE) test strip Use as instructed to test blood glucose four times daily.  . insulin  isophane & regular human (HUMULIN 70/30 KWIKPEN) (70-30) 100 UNIT/ML KwikPen INJECT 50 UNITS INTO THE  SKIN AT BREAKFAST AND 45 UNITS AT SUPPER.  . Lancets (FREESTYLE) lancets Use as instructed three times daily dx e11.65  . metolazone (ZAROXOLYN) 2.5 MG tablet TAKE 1 TABLET BY MOUTH  DAILY  . montelukast (SINGULAIR) 10 MG tablet Take 1 tablet (10 mg total) by mouth at bedtime.  . ondansetron (ZOFRAN) 4 MG tablet Take 1 tablet (4 mg total) by mouth 2 (two) times daily as needed for nausea or vomiting.  . pantoprazole (PROTONIX) 20 MG tablet TAKE 1 TABLET BY MOUTH  DAILY  . potassium chloride (K-DUR) 10 MEQ tablet Take 1 tablet (10 mEq total) by mouth 3 (three) times daily.  . potassium chloride (KLOR-CON) 10  MEQ tablet TAKE 1 TABLET BY MOUTH 3  TIMES DAILY  . rosuvastatin (CRESTOR) 10 MG tablet Take 1 tablet (10 mg total) by mouth daily.  . Semaglutide, 1 MG/DOSE, (OZEMPIC, 1 MG/DOSE,) 2 MG/1.5ML SOPN Inject 0.5 mg into the skin once a week.  . torsemide (DEMADEX) 20 MG tablet Take one tab by mouth ('20mg'$ ) every other day, alternating with two tabs ('40mg'$ ) every other day  . UNABLE TO FIND Diabetes shoes x 1 and inserts x 3 Dx:E11.29  . UNABLE TO FIND Compression hose (knee high) 15-33m 1 pair Dx bilateral leg swelling  . UNABLE TO FIND Diabetic shoes x 1 inserts x 3  Dx E11.9  . vitamin B-12 (CYANOCOBALAMIN) 100 MCG tablet Take 100 mcg by mouth daily.   No current facility-administered medications for this visit. (Other)      REVIEW OF SYSTEMS:    ALLERGIES Allergies  Allergen Reactions  . Nsaids     Kidney function   . Diclofenac Other (See Comments)    hallucination  . Oxycodone     Vomiting     PAST MEDICAL HISTORY Past Medical History:  Diagnosis Date  . Anemia   . Fibroids    Uterine  . GERD (gastroesophageal reflux disease)   . Hyperlipidemia 2008   Lipid profile in 04/2011:136, 53, 43  . Hypertension 2008   Normal CBC and CMet in 2012; negative stress nuclear in 2006- patient asymptomatic  . Insulin dependent diabetes mellitus 1996  . Multiple allergies    perennial  . Normocytic normochromic anemia 12/27/2015  . Obesity   . PONV (postoperative nausea and vomiting)   . Sleep apnea    CPAP   Past Surgical History:  Procedure Laterality Date  . COLONOSCOPY N/A 04/05/2015   Procedure: COLONOSCOPY;  Surgeon: NRogene Houston MD;  Location: [facility];  Service: Endoscopy;  Laterality: N/A;  730  . DENTAL SURGERY    . ESOPHAGOGASTRODUODENOSCOPY N/A 01/21/2016   Procedure: ESOPHAGOGASTRODUODENOSCOPY (EGD);  Surgeon: NRogene Houston MD;  Location: [facility];  Service: Endoscopy;  Laterality: N/A;  11:15  . REFRACTIVE SURGERY  2011   Bilateral, two  seperate occasions first in 2006  . REMOVAL OF IMPLANT    . RETINAL DETACHMENT SURGERY Bilateral 05/2005    FAMILY HISTORY Family History  Problem Relation Age of Onset  . Hypertension Mother   . Diabetes Mother   . Kidney failure Mother   . Stroke Mother   . Kidney failure Father   . Diabetes Father   . Heart attack Father   . COPD Sister   . Diabetes Brother   . Diabetes Brother   . Diabetes Brother   . Diabetes Brother   .  Diabetes Brother   . Hypertension Brother     SOCIAL HISTORY Social History   Tobacco Use  . Smoking status: Never Smoker  . Smokeless tobacco: Never Used  Substance Use Topics  . Alcohol use: No  . Drug use: No         OPHTHALMIC EXAM: Base Eye Exam    Visual Acuity (Snellen - Linear)      Right Left   Dist [place] 20/25-2 20/25-1       Tonometry (Tonopen, 9:31 AM)      Right Left   Pressure 15 17       Pupils      Pupils Dark Light Shape React APD   Right PERRL 5 5 Round Minimal None   Left PERRL 2 2 Round Minimal None       Visual Fields (Counting fingers)      Left Right    Full Full       Extraocular Movement      Right Left    Full Full       Neuro/Psych    Oriented x3: Yes   Mood/Affect: Normal       Dilation    Left eye: 1.0% Mydriacyl, 2.5% Phenylephrine @ 9:33 AM        Slit Lamp and Fundus Exam    External Exam      Right Left   External Normal Normal       Slit Lamp Exam      Right Left   Lids/Lashes Normal Normal   Conjunctiva/Sclera White and quiet White and quiet   Cornea Clear Clear   Anterior Chamber Deep and quiet Deep and quiet   Iris Round and reactive Round and reactive   Vitreous Normal Normal          IMAGING AND PROCEDURES  Imaging and Procedures for 03/15/20           ASSESSMENT/PLAN:  No problem-specific Assessment & Plan notes found for this encounter.      ICD-10-CM   1. Nuclear sclerotic cataract of both eyes  H25.13   2. 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 determined by examination associated with type 2 diabetes mellitus ([place])  P38.2505   4. Retinal hemorrhage of right eye  H35.61   5. Retinal hemorrhage of left eye  H35.62   6. Cystoid macular edema of left eye  H35.352   7. 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  Resume CPAP.  2.  Patient is okay to proceed with cataract extraction with intraocular lens placement.  3.  Return here in 2 weeks for repeat OCT particularly attention the left eye to look for changes should the patient complete 2 weeks of consistent use of CPAP.  The importance of this was reviewed in cases demonstrated  Ophthalmic Meds Ordered this visit:  No orders of the defined types were placed in this encounter.      No follow-ups on file.  There are no Patient Instructions on file for this visit.   Explained the diagnoses, plan, and follow up with the patient and they expressed understanding.  Patient expressed understanding of the importance of proper follow up care.   Clent Demark Jamieka Royle M.D. Diseases & Surgery of the Retina and Vitreous Retina [facility] Diabetic [place] 03/15/20  Abbreviations: M myopia (nearsighted); A astigmatism; H hyperopia (farsighted); P presbyopia; Mrx spectacle prescription;  CTL contact lenses; OD right eye; OS left eye; OU both eyes  XT exotropia; ET esotropia; PEK punctate epithelial keratitis; PEE punctate epithelial erosions; DES dry eye syndrome; MGD meibomian gland dysfunction; ATs artificial tears; PFAT's preservative free artificial tears; [place] nuclear sclerotic cataract; PSC posterior subcapsular cataract; ERM epi-retinal membrane; PVD posterior vitreous detachment; RD retinal detachment; DM diabetes mellitus; DR diabetic retinopathy; NPDR non-proliferative diabetic retinopathy; PDR proliferative  diabetic retinopathy; CSME clinically significant macular edema; DME diabetic macular edema; dbh dot blot hemorrhages; CWS cotton wool spot; POAG primary open angle glaucoma; C/D cup-to-disc ratio; HVF humphrey visual field; GVF goldmann visual field; OCT optical coherence tomography; IOP intraocular pressure; BRVO Branch retinal vein occlusion; CRVO central retinal vein occlusion; CRAO central retinal artery occlusion; BRAO branch retinal artery occlusion; RT retinal tear; SB scleral buckle; PPV pars plana vitrectomy; VH Vitreous hemorrhage; PRP panretinal laser photocoagulation; IVK intravitreal kenalog; VMT vitreomacular traction; MH Macular hole;  NVD neovascularization of the disc; NVE neovascularization elsewhere; AREDS age related eye disease study; ARMD age related macular degeneration; POAG primary open angle glaucoma; EBMD epithelial/anterior basement membrane dystrophy; ACIOL anterior chamber intraocular lens; IOL intraocular lens; PCIOL posterior chamber intraocular lens; Phaco/IOL phacoemulsification with intraocular lens placement; [place] photorefractive keratectomy; LASIK laser assisted in situ keratomileusis; HTN hypertension; DM diabetes mellitus; COPD chronic obstructive pulmonary disease

## 2020-03-15 NOTE — Assessment & Plan Note (Signed)
Macular telangiectasis (MAC-TEL), or parafoveal telangiectasis is a condition of "unknown" cause.  Findings in or near the macula (center of vision) consist of microaneurysms (leaking small capillaries), often with leakage of fluid which in the active phase can impact fine discriminatory vision, and in some cases trigger profound scarring in the macula, with severe permanent vision loss.  Standard treatment is observation and periodic examinations to monitor for treatable complications.   The cause  of this condition is "unknown".  However, the practice of Dr. Zadie Rhine has discovered an association with sleep apnea with its nightly periods of low oxygen in the blood stream (hypoxia), retained carbon dioxide (hypercapnia), associated with transient nocturnal hypertensive episodes.   More recently, some patients also been found to have advanced lung disease, whether asthma or COPD, with similar findings.  Dr. Zadie Rhine has been evaluating the association of sleep apnea, nightly hypoxia, and Macular telangiectasis for over 18 years.  Most patients are found to be noncompliant with sleep apnea therapy or testing in the past.  Resumption of CPAP or similar therapy is strongly recommended if ordered in the past.  Upon review of risk factors or findings positive for sleep apnea, more formal, extensive sleep laboratory or home testing, may be recommended.  Numerous patients, proven to have MAC-TEL, have improved or resolved their ey condition promptly, within weeks, of the use of nighttime oxygen supplementation or continuous positive airway pressure (CPAP).  Patient has been asked to resume CPAP use immediately for ocular health as well as CNS protection

## 2020-03-15 NOTE — Assessment & Plan Note (Signed)
Macular telangiectasis (MAC-TEL), or parafoveal telangiectasis is a condition of "unknown" cause.  Findings in or near the macula (center of vision) consist of microaneurysms (leaking small capillaries), often with leakage of fluid which in the active phase can impact fine discriminatory vision, and in some cases trigger profound scarring in the macula, with severe permanent vision loss.  Standard treatment is observation and periodic examinations to monitor for treatable complications.   The cause  of this condition is "unknown".  However, the practice of Dr. Zadie Rhine has discovered an association with sleep apnea with its nightly periods of low oxygen in the blood stream (hypoxia), retained carbon dioxide (hypercapnia), associated with transient nocturnal hypertensive episodes.   More recently, some patients also been found to have advanced lung disease, whether asthma or COPD, with similar findings.  Dr. Zadie Rhine has been evaluating the association of sleep apnea, nightly hypoxia, and Macular telangiectasis for over 18 years.  Most patients are found to be noncompliant with sleep apnea therapy or testing in the past.  Resumption of CPAP or similar therapy is strongly recommended if ordered in the past.  Upon review of risk factors or findings positive for sleep apnea, more formal, extensive sleep laboratory or home testing, may be recommended.  Numerous patients, proven to have MAC-TEL, have improved or resolved their ey condition promptly, within weeks, of the use of nighttime oxygen supplementation or continuous positive airway pressure (CPAP).  Micro cystoid macular edema noted OU on dilated retinal examination.  I urged patient to resume CPAP use and return visit here in 2 weeks to monitor the results via OCT imaging of the macula.

## 2020-03-16 ENCOUNTER — Ambulatory Visit: Payer: Medicare Other | Admitting: "Endocrinology

## 2020-03-16 DIAGNOSIS — H25811 Combined forms of age-related cataract, right eye: Secondary | ICD-10-CM | POA: Diagnosis not present

## 2020-03-16 DIAGNOSIS — H2511 Age-related nuclear cataract, right eye: Secondary | ICD-10-CM | POA: Diagnosis not present

## 2020-03-19 ENCOUNTER — Ambulatory Visit (INDEPENDENT_AMBULATORY_CARE_PROVIDER_SITE_OTHER): Payer: Medicare Other | Admitting: "Endocrinology

## 2020-03-19 ENCOUNTER — Other Ambulatory Visit: Payer: Self-pay

## 2020-03-19 ENCOUNTER — Encounter: Payer: Self-pay | Admitting: "Endocrinology

## 2020-03-19 VITALS — BP 126/56 | HR 72 | Ht 65.0 in | Wt 326.4 lb

## 2020-03-19 DIAGNOSIS — N183 Chronic kidney disease, stage 3 unspecified: Secondary | ICD-10-CM

## 2020-03-19 DIAGNOSIS — I10 Essential (primary) hypertension: Secondary | ICD-10-CM | POA: Diagnosis not present

## 2020-03-19 DIAGNOSIS — E782 Mixed hyperlipidemia: Secondary | ICD-10-CM

## 2020-03-19 DIAGNOSIS — E559 Vitamin D deficiency, unspecified: Secondary | ICD-10-CM | POA: Diagnosis not present

## 2020-03-19 DIAGNOSIS — E1165 Type 2 diabetes mellitus with hyperglycemia: Secondary | ICD-10-CM

## 2020-03-19 DIAGNOSIS — E1122 Type 2 diabetes mellitus with diabetic chronic kidney disease: Secondary | ICD-10-CM | POA: Diagnosis not present

## 2020-03-19 DIAGNOSIS — IMO0002 Reserved for concepts with insufficient information to code with codable children: Secondary | ICD-10-CM

## 2020-03-19 LAB — POCT GLYCOSYLATED HEMOGLOBIN (HGB A1C): Hemoglobin A1C: 7.8 % — AB (ref 4.0–5.6)

## 2020-03-19 MED ORDER — HUMULIN 70/30 KWIKPEN (70-30) 100 UNIT/ML ~~LOC~~ SUPN: PEN_INJECTOR | SUBCUTANEOUS | 0 refills | Status: DC

## 2020-03-19 NOTE — Progress Notes (Signed)
03/19/2020, 1:01 PM    Endocrinology follow-up note   Subjective:    [name], female    DOB: 1961/03/19.  Kendra Hanson is seen in follow-up in the management of her currently uncontrolled type 2 diabetes, hypertension, hyperlipidemia.   -PMD:   Fayrene Helper, MD.   Past Medical History:  Diagnosis Date  . Anemia   . Fibroids    Uterine  . GERD (gastroesophageal reflux disease)   . Hyperlipidemia 2008   Lipid profile in 04/2011:136, 53, 43  . Hypertension 2008   Normal CBC and CMet in 2012; negative stress nuclear in 2006- patient asymptomatic  . Insulin dependent diabetes mellitus 1996  . Multiple allergies    perennial  . Normocytic normochromic anemia 12/27/2015  . Obesity   . PONV (postoperative nausea and vomiting)   . Sleep apnea    CPAP   Past Surgical History:  Procedure Laterality Date  . COLONOSCOPY N/A 04/05/2015   Procedure: COLONOSCOPY;  Surgeon: Rogene Houston, MD;  Location: [facility];  Service: Endoscopy;  Laterality: N/A;  730  . DENTAL SURGERY    . ESOPHAGOGASTRODUODENOSCOPY N/A 01/21/2016   Procedure: ESOPHAGOGASTRODUODENOSCOPY (EGD);  Surgeon: Rogene Houston, MD;  Location: [facility];  Service: Endoscopy;  Laterality: N/A;  11:15  . REFRACTIVE SURGERY  2011   Bilateral, two seperate occasions first in 2006  . REMOVAL OF IMPLANT    . RETINAL DETACHMENT SURGERY Bilateral 05/2005   Social History   Socioeconomic History  . Marital status: Married    Spouse name: Not on file  . Number of children: Not on file  . Years of education: Not on file  . Highest education level: Not on file  Occupational History  . Occupation: Employed    Employer: [employer]  Tobacco Use  . Smoking status: Never Smoker  . Smokeless tobacco: Never Used  Substance and Sexual Activity  . Alcohol use: No  . Drug use: No  . Sexual activity: Yes    Birth control/protection: None,  Implant  Other Topics Concern  . Not on file  Social History Narrative   Married with 2 children   Social Determinants of Health   Financial Resource Strain: Low Risk   . Difficulty of Paying Living Expenses: Not hard at all  Food Insecurity: No Food Insecurity  . Worried About Charity fundraiser in the Last Year: Never true  . Ran Out of Food in the Last Year: Never true  Transportation Needs: No Transportation Needs  . Lack of Transportation (Medical): No  . Lack of Transportation (Non-Medical): No  Physical Activity: Inactive  . Days of Exercise per Week: 0 days  . Minutes of Exercise per Session: 0 min  Stress: No Stress Concern Present  . Feeling of Stress : Not at all  Social Connections: Not Isolated  . Frequency of Communication with Friends and Family: More than three times a week  . Frequency of Social Gatherings with Friends and Family: Never  . Attends Religious Services: More than 4 times per year  . Active Member of Clubs or Organizations: Yes  . Attends Archivist Meetings: More than 4 times per year  . Marital Status: Married   Outpatient Encounter Medications as of 03/19/2020  Medication Sig  . atropine 1 % ophthalmic solution   . azelastine (ASTELIN) 0.1 % nasal spray Place 2 sprays into both nostrils 2 (two) times daily. Use in each nostril as directed  . benzonatate (TESSALON) 100 MG capsule Take 1 capsule (100 mg total) by mouth 2 (two) times daily as needed for cough.  . Blood Glucose Monitoring Suppl (ACCU-CHEK GUIDE ME) w/Device KIT 1 Piece by Does not apply route as directed.  . Blood Glucose Monitoring Suppl (FREESTYLE LITE) DEVI   . brimonidine (ALPHAGAN) 0.2 % ophthalmic solution Place 1 drop into the right eye 3 (three) times daily.  . cetirizine (ZYRTEC) 10 MG tablet Take 10 mg by mouth daily.  . Continuous Blood Gluc Sensor (FREESTYLE LIBRE 14 DAY SENSOR) MISC Inject 1 each into the skin every 14 (fourteen) days. Use as directed.  .  cyclobenzaprine (FLEXERIL) 10 MG tablet Take 1 tablet (10 mg total) by mouth 3 (three) times daily as needed for muscle spasms.  Marland Kitchen diltiazem (CARDIZEM CD) 360 MG 24 hr capsule TAKE 1 CAPSULE BY MOUTH DAILY  . diltiazem (TIAZAC) 360 MG 24 hr capsule TAKE 1 CAPSULE BY MOUTH  DAILY  . Docusate Calcium (STOOL SOFTENER PO) Take by mouth daily.  . ferrous sulfate 325 (65 FE) MG tablet Take 325 mg by mouth daily with breakfast.  . glipiZIDE (GLUCOTROL XL) 5 MG 24 hr tablet TAKE 1 TABLET BY MOUTH  TWICE DAILY WITH MEALS  . GLOBAL EASE INJECT PEN NEEDLES 31G X 8 MM MISC USE UP TO 5 TIMES A DAY AS DIRECTED.  Marland Kitchen glucose blood (ACCU-CHEK GUIDE) test strip Use as instructed to test blood glucose four times daily.  . insulin isophane & regular human (HUMULIN 70/30 KWIKPEN) (70-30) 100 UNIT/ML KwikPen INJECT 44 UNITS INTO THE  SKIN AT BREAKFAST AND 44 UNITS AT SUPPER.  Marland Kitchen ketorolac (ACULAR) 0.5 % ophthalmic solution   . Lancets (FREESTYLE) lancets Use as instructed three times daily dx e11.65  . metolazone (ZAROXOLYN) 2.5 MG tablet TAKE 1 TABLET BY MOUTH  DAILY  . ofloxacin (OCUFLOX) 0.3 % ophthalmic solution 1 drop 4 (four) times daily.  . ondansetron (ZOFRAN) 4 MG tablet Take 1 tablet (4 mg total) by mouth 2 (two) times daily as needed for nausea or vomiting.  . pantoprazole (PROTONIX) 20 MG tablet TAKE 1 TABLET BY MOUTH  DAILY  . potassium chloride (K-DUR) 10 MEQ tablet Take 1 tablet (10 mEq total) by mouth 3 (three) times daily.  . potassium chloride (KLOR-CON) 10 MEQ tablet TAKE 1 TABLET BY MOUTH 3  TIMES DAILY  . prednisoLONE acetate (PRED FORTE) 1 % ophthalmic suspension Place 1 drop into the right eye 4 (four) times daily.  . rosuvastatin (CRESTOR) 10 MG tablet Take 1 tablet (10 mg total) by mouth daily.  . Semaglutide, 1 MG/DOSE, (OZEMPIC, 1 MG/DOSE,) 2 MG/1.5ML SOPN Inject 0.5 mg into the skin once a week.  . torsemide (DEMADEX) 20 MG tablet Take one tab by mouth (26m) every other day, alternating with  two tabs (446m every other day  . UNABLE TO FIND Diabetes shoes x 1 and inserts x 3 Dx:E11.29  . UNABLE TO FIND Compression hose (knee high) 15-2078m pair Dx bilateral leg swelling  . UNABLE TO FIND Diabetic shoes x 1 inserts x 3  Dx E11.9  . vitamin B-12 (CYANOCOBALAMIN) 100 MCG tablet Take 100 mcg by mouth daily.  . [DISCONTINUED] ergocalciferol (VITAMIN D2) 1.25 MG (50000 UT) capsule Take 1 capsule (50,000 Units total)  by mouth once a week. One capsule once weekly  . [DISCONTINUED] fluticasone (FLONASE) 50 MCG/ACT nasal spray Place 2 sprays into both nostrils daily.  . [DISCONTINUED] insulin isophane & regular human (HUMULIN 70/30 KWIKPEN) (70-30) 100 UNIT/ML KwikPen INJECT 50 UNITS INTO THE  SKIN AT BREAKFAST AND 45 UNITS AT SUPPER.  . [DISCONTINUED] montelukast (SINGULAIR) 10 MG tablet Take 1 tablet (10 mg total) by mouth at bedtime.   No facility-administered encounter medications on file as of 03/19/2020.    ALLERGIES: Allergies  Allergen Reactions  . Nsaids     Kidney function   . Diclofenac Other (See Comments)    hallucination  . Oxycodone     Vomiting     VACCINATION STATUS: Immunization History  Administered Date(s) Administered  . Influenza Split 08/14/2014  . Influenza,inj,Quad PF,6+ Mos 08/02/2018, 07/14/2019  . Pneumococcal Conjugate-13 02/01/2015  . Pneumococcal Polysaccharide-23 05/23/2005, 03/28/2016  . Tdap 10/08/2013  . Zoster Recombinat (Shingrix) 10/31/2018, 04/28/2019    Diabetes She presents for her follow-up diabetic visit. She has type 2 diabetes mellitus. Onset time: She was diagnosed at approximate age of 5 years. Her disease course has been worsening. Pertinent negatives for hypoglycemia include no confusion, headaches, pallor, seizures or sweats. Pertinent negatives for diabetes include no chest pain, no fatigue, no polydipsia, no polyphagia and no polyuria. There are no hypoglycemic complications. Symptoms are worsening. Diabetic complications  include nephropathy. Risk factors for coronary artery disease include diabetes mellitus, dyslipidemia, hypertension, obesity and sedentary lifestyle. Current diabetic treatment includes insulin injections. Her weight is fluctuating minimally. She is following a generally unhealthy diet. When asked about meal planning, she reported none. She has not had a previous visit with a dietitian. She never participates in exercise. Her home blood glucose trend is increasing steadily. Her breakfast blood glucose range is generally 130-140 mg/dl. Her bedtime blood glucose range is generally 180-200 mg/dl. Her overall blood glucose range is 140-180 mg/dl. (She returns with ordered fasting glycemic profile, slightly above target postprandial blood glucose profile and point-of-care A1c 7.8% compared to 7.5% during her last visit.  Overall her A1c is improving from 8.8% from last year.  ) An ACE inhibitor/angiotensin II receptor blocker is not being taken. Eye exam is current.  Hyperlipidemia This is a chronic problem. The problem is uncontrolled. Exacerbating diseases include diabetes and obesity. Pertinent negatives include no chest pain, myalgias or shortness of breath. Current antihyperlipidemic treatment includes statins. Risk factors for coronary artery disease include dyslipidemia, diabetes mellitus, a sedentary lifestyle, post-menopausal and family history.      Review of systems  Constitutional: + Minimally fluctuating body weight,  current  Body mass index is 54.32 kg/m. , no fatigue, no subjective hyperthermia, no subjective hypothermia Eyes: no blurry vision, no xerophthalmia ENT: no sore throat, no nodules palpated in throat, no dysphagia/odynophagia, no hoarseness Cardiovascular: no Chest Pain, no Shortness of Breath, no palpitations, no leg swelling Respiratory: no cough, no shortness of breath Gastrointestinal: no Nausea/Vomiting/Diarhhea Musculoskeletal: no muscle/joint aches Skin: no rashes, no  hyperemia Neurological: no tremors, no numbness, no tingling, no dizziness Psychiatric: no depression, no anxiety    Objective:    BP (!) 126/56   Pulse 72   Ht 5' 5"  (1.651 m)   Wt (!) 326 lb 6.4 oz (148.1 kg)   BMI 54.32 kg/m   Wt Readings from Last 3 Encounters:  03/19/20 (!) 326 lb 6.4 oz (148.1 kg)  12/16/19 (!) 328 lb 12.8 oz (149.1 kg)  11/06/19 (!) 320 lb (145.2  kg)      Physical Exam- Limited  Constitutional:  Body mass index is 54.32 kg/m. , not in acute distress, normal state of mind Eyes:  EOMI, no exophthalmos Neck: Supple Thyroid: No gross goiter Respiratory: Adequate breathing efforts Musculoskeletal:  strength intact in all four extremities, no gross restriction of joint movements Skin:  no rashes, no hyperemia Neurological: no tremor with outstretched hands,    CMP     Component Value Date/Time   NA 140 02/03/2020 1008   NA 141 06/04/2019 1059   K 4.1 02/03/2020 1008   CL 99 02/03/2020 1008   CO2 32 02/03/2020 1008   GLUCOSE 141 (H) 02/03/2020 1008   BUN 36 (H) 02/03/2020 1008   BUN 18 06/04/2019 1059   CREATININE 1.56 (H) 02/03/2020 1008   CALCIUM 9.3 02/03/2020 1008   PROT 7.3 02/03/2020 1008   PROT 7.2 06/04/2019 1059   ALBUMIN 4.1 06/04/2019 1059   AST 14 02/03/2020 1008   ALT 12 02/03/2020 1008   ALKPHOS 114 06/04/2019 1059   BILITOT 0.6 02/03/2020 1008   BILITOT 0.4 06/04/2019 1059   GFRNONAA 36 (L) 02/03/2020 1008   GFRAA 42 (L) 02/03/2020 1008     Diabetic Labs (most recent): Lab Results  Component Value Date   HGBA1C 7.8 (A) 03/19/2020   HGBA1C 7.5 (A) 12/16/2019   HGBA1C 8.8 (H) 09/02/2019    Lipid Panel     Component Value Date/Time   CHOL 202 (H) 09/02/2019 0846   CHOL 247 (H) 04/17/2019 1043   TRIG 108 09/02/2019 0846   HDL 49 (L) 09/02/2019 0846   HDL 52 04/17/2019 1043   CHOLHDL 4.1 09/02/2019 0846   VLDL 15 04/12/2017 1121   LDLCALC 131 (H) 09/02/2019 0846     Assessment & Plan:   1. Uncontrolled type  2 diabetes mellitus with stage 3 chronic kidney disease ([place])  - Kendra Hanson has currently uncontrolled symptomatic type 2 DM since 59 years of age.  She returns with ordered fasting glycemic profile, slightly above target postprandial blood glucose profile and point-of-care A1c 7.8% compared to 7.5% during her last visit.  Overall her A1c is improving from 8.8% from last year.    -her diabetes is complicated by stage 3 renal insufficiency and she remains at a high risk for more acute and chronic complications which include CAD, CVA, CKD, retinopathy, and neuropathy. These are all discussed in detail with her.  - I have counseled her on diet management and weight loss, by adopting a carbohydrate restricted/protein rich diet.  - she  admits there is a room for improvement in her diet and drink choices. -  Suggestion is made for her to avoid simple carbohydrates  from her diet including Cakes, Sweet Desserts / Pastries, Ice Cream, Soda (diet and regular), Sweet Tea, Candies, Chips, Cookies, Sweet Pastries,  Store Bought Juices, Alcohol in Excess of  1-2 drinks a day, Artificial Sweeteners, Coffee Creamer, and "Sugar-free" Products. This will help patient to have stable blood glucose profile and potentially avoid unintended weight gain.   - I encouraged her to switch to  unprocessed or minimally processed complex starch and increased protein intake (animal or plant source), fruits, and vegetables.  - she is advised to stick to a routine mealtimes to eat 3 meals  a day and avoid unnecessary snacks ( to snack only to correct hypoglycemia).    - I have approached her with the following individualized plan to manage diabetes and patient agrees:    -  She wishes to stay on her premixed insulin.  She did not afford the co-pays for the CGM device.  -She is advised to avoid changing her insulin doses and resume Humulin 70/30 at 44 units with breakfast and 44 units with supper  associated with strict  monitoring of blood glucose 4 times a day-before meals and at bedtime return with her meter and logs and repeat labs in 4 weeks.    -  She did not get coverage from [facility], still get samples from clinic.  She is advised to continue  Ozempic 0.5 mg subcutaneously weekly.  Advised to continue glipizide 5 mg p.o. twice daily with breakfast and with supper.    - she is encouraged to call clinic for fasting blood glucose levels less than 70 or above 200 mg /dl.  - she is not a candidate for metformin, SGLT2 inhibitors due to concurrent renal insufficiency.  - Patient specific target  A1c;  LDL, HDL, Triglycerides, and  Waist Circumference were discussed in detail.  2) Blood Pressure /Hypertension: Blood pressure is controlled to target.   she is advised to continue her current medications including diltiazem 360 mg p.o. daily with breakfast .  3) Lipids/Hyperlipidemia:   Review of her recent lipid panel showed  uncontrolled  LDL at 131 improving from 187.  Although she is responding to it, she is reporting inconsistency taking her Crestor that she is advised to resume and continue Crestor 10 mg p.o. nightly.   Side effects and precautions discussed with her.    #4) weight management: Her BMI is 54.3.  This is clearly complicating her diabetes care.  She is a candidate for modest weight loss.  Her obesity is a result of positive caloric intake.  Carbs restrictions and exercise regimen discussed with her.  She is a good candidate for bariatric surgery, which she is not ready to consider at this time.  - she is  advised to maintain close follow up with Fayrene Helper, MD for primary care needs, as well as her other providers for optimal and coordinated care.  - Time spent on this patient care encounter:  35 min, of which > 50% was spent in  counseling and the rest reviewing her blood glucose logs , discussing her hypoglycemia and hyperglycemia episodes, reviewing her current and  previous labs /  studies  ( including abstraction from other facilities) and medications  doses and developing a  long term treatment plan and documenting her care.   Please refer to Patient Instructions for Blood Glucose Monitoring and Insulin/Medications Dosing Guide"  in media tab for additional information. Please  also refer to " Patient Self Inventory" in the Media  tab for reviewed elements of pertinent patient history.  Kendra Hanson participated in the discussions, expressed understanding, and voiced agreement with the above plans.  All questions were answered to her satisfaction. she is encouraged to contact clinic should she have any questions or concerns prior to her return visit.    Follow up plan: - Return in about 4 months (around 07/20/2020) for Bring Meter and Logs- A1c in Office.  Glade Lloyd, MD [facility] [address] [place], [place] [address] Phone: [phone]  Fax: [phone]    03/19/2020, 1:01 PM  This note was partially dictated with voice recognition software. Similar sounding words can be transcribed inadequately or may not  be corrected upon review.

## 2020-03-19 NOTE — Patient Instructions (Signed)

## 2020-03-21 ENCOUNTER — Other Ambulatory Visit: Payer: Self-pay | Admitting: Family Medicine

## 2020-03-22 ENCOUNTER — Other Ambulatory Visit: Payer: Self-pay

## 2020-03-22 ENCOUNTER — Encounter: Payer: Self-pay | Admitting: Family Medicine

## 2020-03-22 DIAGNOSIS — N183 Chronic kidney disease, stage 3 unspecified: Secondary | ICD-10-CM

## 2020-03-22 DIAGNOSIS — IMO0002 Reserved for concepts with insufficient information to code with codable children: Secondary | ICD-10-CM

## 2020-03-22 DIAGNOSIS — E1122 Type 2 diabetes mellitus with diabetic chronic kidney disease: Secondary | ICD-10-CM

## 2020-03-22 DIAGNOSIS — E1165 Type 2 diabetes mellitus with hyperglycemia: Secondary | ICD-10-CM

## 2020-03-22 MED ORDER — GLIPIZIDE ER 5 MG PO TB24: 5.0000 mg | ORAL_TABLET | Freq: Two times a day (BID) | ORAL | 1 refills | Status: DC

## 2020-03-23 NOTE — Telephone Encounter (Signed)
Vitamin in March was in range, she can take Vitamin D 3 OTC 1,000-2,000 IU daily now.

## 2020-03-29 ENCOUNTER — Ambulatory Visit (INDEPENDENT_AMBULATORY_CARE_PROVIDER_SITE_OTHER): Payer: Medicare Other | Admitting: Ophthalmology

## 2020-03-29 ENCOUNTER — Other Ambulatory Visit: Payer: Self-pay

## 2020-03-29 ENCOUNTER — Encounter (INDEPENDENT_AMBULATORY_CARE_PROVIDER_SITE_OTHER): Payer: Self-pay | Admitting: Ophthalmology

## 2020-03-29 DIAGNOSIS — H35072 Retinal telangiectasis, left eye: Secondary | ICD-10-CM | POA: Diagnosis not present

## 2020-03-29 DIAGNOSIS — E559 Vitamin D deficiency, unspecified: Secondary | ICD-10-CM | POA: Diagnosis not present

## 2020-03-29 DIAGNOSIS — H35352 Cystoid macular degeneration, left eye: Secondary | ICD-10-CM | POA: Diagnosis not present

## 2020-03-29 DIAGNOSIS — I5032 Chronic diastolic (congestive) heart failure: Secondary | ICD-10-CM | POA: Diagnosis not present

## 2020-03-29 DIAGNOSIS — E1122 Type 2 diabetes mellitus with diabetic chronic kidney disease: Secondary | ICD-10-CM | POA: Diagnosis not present

## 2020-03-29 DIAGNOSIS — N189 Chronic kidney disease, unspecified: Secondary | ICD-10-CM | POA: Diagnosis not present

## 2020-03-29 DIAGNOSIS — E211 Secondary hyperparathyroidism, not elsewhere classified: Secondary | ICD-10-CM | POA: Diagnosis not present

## 2020-03-29 NOTE — Assessment & Plan Note (Signed)
Patient improved very nicely with her mild temporal CME OS only 2 weeks after reinstitution of CPAP use.  I would like to see her some 2 to 4 weeks after cataract extraction of the left eye has been performed to continue to monitor that her macular edema is improving on the CPAP use.  Reports she also has a sense of improved sense of wellbeing

## 2020-03-29 NOTE — Progress Notes (Signed)
03/29/2020     CHIEF COMPLAINT Patient presents for Retina Follow Up   HISTORY OF PRESENT ILLNESS: Kendra Hanson is a 59 y.o. female who presents to the clinic today for:   HPI    Retina Follow Up    Patient presents with  Other (Mac Tel).  In left eye.  Severity is moderate.  Duration of 2 weeks.  Since onset it is stable.  I, the attending physician,  performed the HPI with the patient and updated documentation appropriately.          Comments    2 Week Mac Tel f/u OU. OCT  Pt states no changes in vision. Pt had Cat sx OD. Pt has had mild headaches since then. Pt has been trying to use the CPAP more. She used CPAP for 5 hrs last night.        Last edited by [name] on 03/29/2020 11:05 AM. (History)      Referring physician: Fayrene Helper, MD [address], [address] [place],  [place] [address]  HISTORICAL INFORMATION:   Selected notes from the MEDICAL RECORD NUMBER    Lab Results  Component Value Date   HGBA1C 7.8 (A) 03/19/2020     CURRENT MEDICATIONS: Current Outpatient Medications (Ophthalmic Drugs)  Medication Sig  . atropine 1 % ophthalmic solution   . brimonidine (ALPHAGAN) 0.2 % ophthalmic solution Place 1 drop into the right eye 3 (three) times daily.  Marland Kitchen ketorolac (ACULAR) 0.5 % ophthalmic solution   . ofloxacin (OCUFLOX) 0.3 % ophthalmic solution 1 drop 4 (four) times daily.  . prednisoLONE acetate (PRED FORTE) 1 % ophthalmic suspension Place 1 drop into the right eye 4 (four) times daily.   No current facility-administered medications for this visit. (Ophthalmic Drugs)   Current Outpatient Medications (Other)  Medication Sig  . azelastine (ASTELIN) 0.1 % nasal spray Place 2 sprays into both nostrils 2 (two) times daily. Use in each nostril as directed  . benzonatate (TESSALON) 100 MG capsule Take 1 capsule (100 mg total) by mouth 2 (two) times daily as needed for cough.  . Blood Glucose Monitoring Suppl (ACCU-CHEK GUIDE ME) w/Device  KIT 1 Piece by Does not apply route as directed.  . Blood Glucose Monitoring Suppl (FREESTYLE LITE) DEVI   . cetirizine (ZYRTEC) 10 MG tablet Take 10 mg by mouth daily.  . Continuous Blood Gluc Sensor (FREESTYLE LIBRE 14 DAY SENSOR) MISC Inject 1 each into the skin every 14 (fourteen) days. Use as directed.  . cyclobenzaprine (FLEXERIL) 10 MG tablet Take 1 tablet (10 mg total) by mouth 3 (three) times daily as needed for muscle spasms.  Marland Kitchen diltiazem (CARDIZEM CD) 360 MG 24 hr capsule TAKE 1 CAPSULE BY MOUTH DAILY  . diltiazem (TIAZAC) 360 MG 24 hr capsule TAKE 1 CAPSULE BY MOUTH  DAILY  . Docusate Calcium (STOOL SOFTENER PO) Take by mouth daily.  . ferrous sulfate 325 (65 FE) MG tablet Take 325 mg by mouth daily with breakfast.  . glipiZIDE (GLUCOTROL XL) 5 MG 24 hr tablet Take 1 tablet (5 mg total) by mouth 2 (two) times daily with a meal.  . GLOBAL EASE INJECT PEN NEEDLES 31G X 8 MM MISC USE UP TO 5 TIMES A DAY AS DIRECTED.  Marland Kitchen glucose blood (ACCU-CHEK GUIDE) test strip Use as instructed to test blood glucose four times daily.  . insulin isophane & regular human (HUMULIN 70/30 KWIKPEN) (70-30) 100 UNIT/ML KwikPen INJECT 44 UNITS INTO THE  SKIN AT BREAKFAST AND 44 UNITS AT SUPPER.  . Lancets (FREESTYLE) lancets Use as instructed three times daily dx e11.65  . metolazone (ZAROXOLYN) 2.5 MG tablet TAKE 1 TABLET BY MOUTH  DAILY  . ondansetron (ZOFRAN) 4 MG tablet Take 1 tablet (4 mg total) by mouth 2 (two) times daily as needed for nausea or vomiting.  . pantoprazole (PROTONIX) 20 MG tablet TAKE 1 TABLET BY MOUTH  DAILY  . potassium chloride (K-DUR) 10 MEQ tablet Take 1 tablet (10 mEq total) by mouth 3 (three) times daily.  . potassium chloride (KLOR-CON) 10 MEQ tablet TAKE 1 TABLET BY MOUTH 3  TIMES DAILY  . rosuvastatin (CRESTOR) 10 MG tablet TAKE 1 TABLET BY MOUTH  DAILY  . Semaglutide, 1 MG/DOSE, (OZEMPIC, 1 MG/DOSE,) 2 MG/1.5ML SOPN Inject 0.5 mg into the skin once a week.  . torsemide  (DEMADEX) 20 MG tablet Take one tab by mouth (66m) every other day, alternating with two tabs (473m every other day  . UNABLE TO FIND Diabetes shoes x 1 and inserts x 3 Dx:E11.29  . UNABLE TO FIND Compression hose (knee high) 15-2042m pair Dx bilateral leg swelling  . UNABLE TO FIND Diabetic shoes x 1 inserts x 3  Dx E11.9  . vitamin B-12 (CYANOCOBALAMIN) 100 MCG tablet Take 100 mcg by mouth daily.   No current facility-administered medications for this visit. (Other)      REVIEW OF SYSTEMS:    ALLERGIES Allergies  Allergen Reactions  . Nsaids     Kidney function   . Diclofenac Other (See Comments)    hallucination  . Oxycodone     Vomiting     PAST MEDICAL HISTORY Past Medical History:  Diagnosis Date  . Anemia   . Fibroids    Uterine  . GERD (gastroesophageal reflux disease)   . Hyperlipidemia 2008   Lipid profile in 04/2011:136, 53, 43  . Hypertension 2008   Normal CBC and CMet in 2012; negative stress nuclear in 2006- patient asymptomatic  . Insulin dependent diabetes mellitus 1996  . Multiple allergies    perennial  . Normocytic normochromic anemia 12/27/2015  . Obesity   . PONV (postoperative nausea and vomiting)   . Sleep apnea    CPAP   Past Surgical History:  Procedure Laterality Date  . CATARACT EXTRACTION W/PHACO Right   . COLONOSCOPY N/A 04/05/2015   Procedure: COLONOSCOPY;  Surgeon: NajRogene HoustonD;  Location: [facility];  Service: Endoscopy;  Laterality: N/A;  730  . DENTAL SURGERY    . ESOPHAGOGASTRODUODENOSCOPY N/A 01/21/2016   Procedure: ESOPHAGOGASTRODUODENOSCOPY (EGD);  Surgeon: NajRogene HoustonD;  Location: [facility];  Service: Endoscopy;  Laterality: N/A;  11:15  . REFRACTIVE SURGERY  2011   Bilateral, two seperate occasions first in 2006  . REMOVAL OF IMPLANT    . RETINAL DETACHMENT SURGERY Bilateral 05/2005    FAMILY HISTORY Family History  Problem Relation Age of Onset  . Hypertension Mother   . Diabetes Mother     . Kidney failure Mother   . Stroke Mother   . Kidney failure Father   . Diabetes Father   . Heart attack Father   . COPD Sister   . Diabetes Brother   . Diabetes Brother   . Diabetes Brother   . Diabetes Brother   . Diabetes Brother   . Hypertension Brother     SOCIAL HISTORY Social History   Tobacco Use  . Smoking status: Never Smoker  .  Smokeless tobacco: Never Used  Substance Use Topics  . Alcohol use: No  . Drug use: No         OPHTHALMIC EXAM: Base Eye Exam    Visual Acuity (Snellen - Linear)      Right Left   Dist Cucumber 20/30 20/25       Tonometry (Tonopen, 11:04 AM)      Right Left   Pressure 12 13       Pupils      Dark Light Shape React APD   Right 3 2 Round Minimal None   Left 3 2 Round Minimal None       Visual Fields (Counting fingers)      Left Right   Restrictions Partial outer inferior nasal deficiency Partial outer inferior nasal deficiency       Neuro/Psych    Oriented x3: Yes   Mood/Affect: Normal        Slit Lamp and Fundus Exam    External Exam      Right Left   External Normal Normal       Slit Lamp Exam      Right Left   Lids/Lashes Normal Normal   Conjunctiva/Sclera White and quiet White and quiet   Cornea Clear Clear   Anterior Chamber Deep and quiet Deep and quiet   Iris Round and reactive Round and reactive   Anterior Vitreous Normal Normal          IMAGING AND PROCEDURES  Imaging and Procedures for 03/29/20  OCT, Retina - OU - Both Eyes       Right Eye Quality was good. Central Foveal Thickness: 235. Progression has been stable. Findings include normal observations.   Left Eye Quality was good. Scan locations included subfoveal. Central Foveal Thickness: 213. Progression has improved. Findings include abnormal foveal contour, cystoid macular edema.   Notes Improved macular findings on the left eye after patient reinstituted CPAP usage.  She does have a overall improved sense of wellbeing.                 ASSESSMENT/PLAN:  No problem-specific Assessment & Plan notes found for this encounter.      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  Improved macular findings on clinical examination and most importantly on CME left eye after patient reinstituted nightly use of CPAP.  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  New CPAP use,,Patient improved very nicely with her mild temporal CME OS only 2 weeks after reinstitution of CPAP use.  I would like to see her some 2 to 4 weeks after cataract extraction of the left eye has been performed to continue to monitor that her macular edema is improving on the CPAP use.  Reports she also has a sense of improved sense of wellbeing  3.  Okay to proceed with cataract extraction with intraocular lens placement in the left eye.  Follow follow routine eyedrop medications as per Dr. Herbert Deaner    Ophthalmic Meds Ordered this visit:  No orders of the defined types were placed in this encounter.      No follow-ups on file.  There are no Patient Instructions on file for this visit.   Explained the diagnoses, plan, and follow up with the patient and they expressed understanding.  Patient expressed understanding  of the importance of proper follow up care.   Clent Demark Daulton Harbaugh M.D. Diseases & Surgery of the Retina and Vitreous [facility] [place] 03/29/20     Abbreviations: M myopia (nearsighted); A astigmatism; H hyperopia (farsighted); P presbyopia; Mrx spectacle prescription;  CTL contact lenses; OD right eye; OS left eye; OU both eyes  XT exotropia; ET esotropia; PEK punctate epithelial keratitis; PEE punctate epithelial erosions; DES dry eye syndrome; MGD meibomian gland dysfunction; ATs artificial tears; PFAT's preservative free artificial tears; [place] nuclear  sclerotic cataract; PSC posterior subcapsular cataract; ERM epi-retinal membrane; PVD posterior vitreous detachment; RD retinal detachment; DM diabetes mellitus; DR diabetic retinopathy; NPDR non-proliferative diabetic retinopathy; PDR proliferative diabetic retinopathy; CSME clinically significant macular edema; DME diabetic macular edema; dbh dot blot hemorrhages; CWS cotton wool spot; POAG primary open angle glaucoma; C/D cup-to-disc ratio; HVF humphrey visual field; GVF goldmann visual field; OCT optical coherence tomography; IOP intraocular pressure; BRVO Branch retinal vein occlusion; CRVO central retinal vein occlusion; CRAO central retinal artery occlusion; BRAO branch retinal artery occlusion; RT retinal tear; SB scleral buckle; PPV pars plana vitrectomy; VH Vitreous hemorrhage; PRP panretinal laser photocoagulation; IVK intravitreal kenalog; VMT vitreomacular traction; MH Macular hole;  NVD neovascularization of the disc; NVE neovascularization elsewhere; AREDS age related eye disease study; ARMD age related macular degeneration; POAG primary open angle glaucoma; EBMD epithelial/anterior basement membrane dystrophy; ACIOL anterior chamber intraocular lens; IOL intraocular lens; PCIOL posterior chamber intraocular lens; Phaco/IOL phacoemulsification with intraocular lens placement; [place] photorefractive keratectomy; LASIK laser assisted in situ keratomileusis; HTN hypertension; DM diabetes mellitus; COPD chronic obstructive pulmonary disease

## 2020-03-30 ENCOUNTER — Other Ambulatory Visit: Payer: Self-pay | Admitting: "Endocrinology

## 2020-03-30 DIAGNOSIS — N183 Chronic kidney disease, stage 3 unspecified: Secondary | ICD-10-CM

## 2020-03-30 DIAGNOSIS — IMO0002 Reserved for concepts with insufficient information to code with codable children: Secondary | ICD-10-CM

## 2020-03-30 DIAGNOSIS — E1122 Type 2 diabetes mellitus with diabetic chronic kidney disease: Secondary | ICD-10-CM

## 2020-04-02 DIAGNOSIS — E211 Secondary hyperparathyroidism, not elsewhere classified: Secondary | ICD-10-CM | POA: Diagnosis not present

## 2020-04-02 DIAGNOSIS — I129 Hypertensive chronic kidney disease with stage 1 through stage 4 chronic kidney disease, or unspecified chronic kidney disease: Secondary | ICD-10-CM | POA: Diagnosis not present

## 2020-04-02 DIAGNOSIS — I5032 Chronic diastolic (congestive) heart failure: Secondary | ICD-10-CM | POA: Diagnosis not present

## 2020-04-02 DIAGNOSIS — N189 Chronic kidney disease, unspecified: Secondary | ICD-10-CM | POA: Diagnosis not present

## 2020-04-03 ENCOUNTER — Encounter (HOSPITAL_COMMUNITY): Payer: Self-pay | Admitting: Emergency Medicine

## 2020-04-03 ENCOUNTER — Other Ambulatory Visit: Payer: Self-pay

## 2020-04-03 ENCOUNTER — Emergency Department (HOSPITAL_COMMUNITY)
Admission: EM | Admit: 2020-04-03 | Discharge: 2020-04-03 | Disposition: A | Discharge status: Home / Self Care | Payer: Medicare Other | Attending: Emergency Medicine | Admitting: Emergency Medicine

## 2020-04-03 DIAGNOSIS — Y9389 Activity, other specified: Secondary | ICD-10-CM | POA: Diagnosis not present

## 2020-04-03 DIAGNOSIS — R519 Headache, unspecified: Secondary | ICD-10-CM | POA: Diagnosis not present

## 2020-04-03 DIAGNOSIS — Y999 Unspecified external cause status: Secondary | ICD-10-CM | POA: Insufficient documentation

## 2020-04-03 DIAGNOSIS — Y92410 Unspecified street and highway as the place of occurrence of the external cause: Secondary | ICD-10-CM | POA: Diagnosis not present

## 2020-04-03 DIAGNOSIS — Z041 Encounter for examination and observation following transport accident: Secondary | ICD-10-CM | POA: Insufficient documentation
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
## 2020-04-05 ENCOUNTER — Encounter: Payer: Self-pay | Admitting: Family Medicine

## 2020-04-09 ENCOUNTER — Encounter: Payer: Self-pay | Admitting: Family Medicine

## 2020-04-09 ENCOUNTER — Ambulatory Visit (INDEPENDENT_AMBULATORY_CARE_PROVIDER_SITE_OTHER)
Admission: RE | Admit: 2020-04-09 | Discharge: 2020-04-09 | Disposition: A | Discharge status: Home / Self Care | Payer: Medicare Other | Source: Ambulatory Visit

## 2020-04-09 DIAGNOSIS — M79652 Pain in left thigh: Secondary | ICD-10-CM

## 2020-04-09 DIAGNOSIS — M79651 Pain in right thigh: Secondary | ICD-10-CM

## 2020-04-09 NOTE — Discharge Instructions (Addendum)
Continue to take Tylenol as needed for discomfort.  You can also use over-the-counter muscle cream as needed.    Follow up with your primary care provider or come here to be seen in person if your symptoms are not improving.
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
## 2020-04-14 ENCOUNTER — Ambulatory Visit: Payer: Medicare Other | Admitting: Family Medicine

## 2020-04-21 DIAGNOSIS — H2512 Age-related nuclear cataract, left eye: Secondary | ICD-10-CM | POA: Diagnosis not present

## 2020-04-21 DIAGNOSIS — H25012 Cortical age-related cataract, left eye: Secondary | ICD-10-CM | POA: Diagnosis not present

## 2020-04-22 ENCOUNTER — Encounter (INDEPENDENT_AMBULATORY_CARE_PROVIDER_SITE_OTHER): Payer: Self-pay | Admitting: Gastroenterology

## 2020-04-23 DIAGNOSIS — E1122 Type 2 diabetes mellitus with diabetic chronic kidney disease: Secondary | ICD-10-CM | POA: Diagnosis not present

## 2020-04-23 DIAGNOSIS — I5032 Chronic diastolic (congestive) heart failure: Secondary | ICD-10-CM | POA: Diagnosis not present

## 2020-04-23 DIAGNOSIS — E211 Secondary hyperparathyroidism, not elsewhere classified: Secondary | ICD-10-CM | POA: Diagnosis not present

## 2020-04-23 DIAGNOSIS — I129 Hypertensive chronic kidney disease with stage 1 through stage 4 chronic kidney disease, or unspecified chronic kidney disease: Secondary | ICD-10-CM | POA: Diagnosis not present

## 2020-04-23 DIAGNOSIS — N189 Chronic kidney disease, unspecified: Secondary | ICD-10-CM | POA: Diagnosis not present

## 2020-04-27 DIAGNOSIS — H25812 Combined forms of age-related cataract, left eye: Secondary | ICD-10-CM | POA: Diagnosis not present

## 2020-04-27 DIAGNOSIS — H2512 Age-related nuclear cataract, left eye: Secondary | ICD-10-CM | POA: Diagnosis not present

## 2020-04-27 DIAGNOSIS — H25012 Cortical age-related cataract, left eye: Secondary | ICD-10-CM | POA: Diagnosis not present

## 2020-04-29 ENCOUNTER — Ambulatory Visit (INDEPENDENT_AMBULATORY_CARE_PROVIDER_SITE_OTHER): Payer: Medicare Other | Admitting: Family Medicine

## 2020-04-29 ENCOUNTER — Encounter: Payer: Self-pay | Admitting: Family Medicine

## 2020-04-29 ENCOUNTER — Other Ambulatory Visit: Payer: Self-pay

## 2020-04-29 VITALS — BP 126/56 | HR 80 | Temp 97.3°F | Resp 16 | Ht 65.0 in | Wt 334.0 lb

## 2020-04-29 DIAGNOSIS — M5442 Lumbago with sciatica, left side: Secondary | ICD-10-CM

## 2020-04-30 ENCOUNTER — Encounter: Payer: Self-pay | Admitting: Family Medicine

## 2020-04-30 DIAGNOSIS — E211 Secondary hyperparathyroidism, not elsewhere classified: Secondary | ICD-10-CM | POA: Diagnosis not present

## 2020-04-30 DIAGNOSIS — N189 Chronic kidney disease, unspecified: Secondary | ICD-10-CM | POA: Diagnosis not present

## 2020-04-30 DIAGNOSIS — I5032 Chronic diastolic (congestive) heart failure: Secondary | ICD-10-CM | POA: Diagnosis not present

## 2020-04-30 DIAGNOSIS — I129 Hypertensive chronic kidney disease with stage 1 through stage 4 chronic kidney disease, or unspecified chronic kidney disease: Secondary | ICD-10-CM | POA: Diagnosis not present

## 2020-04-30 DIAGNOSIS — R809 Proteinuria, unspecified: Secondary | ICD-10-CM | POA: Diagnosis not present
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
## 2020-05-04 ENCOUNTER — Ambulatory Visit: Payer: Medicare Other | Admitting: Family Medicine

## 2020-05-05 ENCOUNTER — Other Ambulatory Visit: Payer: Self-pay | Admitting: Physician Assistant

## 2020-05-05 ENCOUNTER — Other Ambulatory Visit: Payer: Self-pay

## 2020-05-05 ENCOUNTER — Ambulatory Visit (INDEPENDENT_AMBULATORY_CARE_PROVIDER_SITE_OTHER): Payer: Medicare Other | Admitting: Family Medicine

## 2020-05-05 ENCOUNTER — Encounter: Payer: Self-pay | Admitting: Family Medicine

## 2020-05-05 DIAGNOSIS — M1712 Unilateral primary osteoarthritis, left knee: Secondary | ICD-10-CM | POA: Diagnosis not present

## 2020-05-05 DIAGNOSIS — M170 Bilateral primary osteoarthritis of knee: Secondary | ICD-10-CM | POA: Diagnosis not present

## 2020-05-05 DIAGNOSIS — S335XXA Sprain of ligaments of lumbar spine, initial encounter: Secondary | ICD-10-CM | POA: Diagnosis not present

## 2020-05-05 DIAGNOSIS — M25569 Pain in unspecified knee: Secondary | ICD-10-CM | POA: Diagnosis not present

## 2020-05-05 DIAGNOSIS — S39012A Strain of muscle, fascia and tendon of lower back, initial encounter: Secondary | ICD-10-CM

## 2020-05-05 DIAGNOSIS — M1711 Unilateral primary osteoarthritis, right knee: Secondary | ICD-10-CM | POA: Diagnosis not present

## 2020-05-05 DIAGNOSIS — M47817 Spondylosis without myelopathy or radiculopathy, lumbosacral region: Secondary | ICD-10-CM

## 2020-05-05 MED ORDER — MONTELUKAST SODIUM 10 MG PO TABS: 10.0000 mg | ORAL_TABLET | Freq: Every day | ORAL | 3 refills | Status: DC

## 2020-05-05 MED ORDER — MOMETASONE FUROATE 50 MCG/ACT NA SUSP
2.0000 | Freq: Every day | NASAL | 12 refills | Status: DC
Start: 2020-05-05 — End: 2020-12-28

## 2020-05-05 MED ORDER — BENZONATATE 100 MG PO CAPS
100.0000 mg | ORAL_CAPSULE | Freq: Two times a day (BID) | ORAL | 1 refills | Status: DC | PRN
Start: 2020-05-05 — End: 2020-12-28
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
## 2020-05-06 ENCOUNTER — Encounter: Payer: Self-pay | Admitting: Family Medicine

## 2020-05-06 ENCOUNTER — Other Ambulatory Visit: Payer: Self-pay

## 2020-05-06 MED ORDER — DILTIAZEM HCL ER COATED BEADS 360 MG PO CP24: 360.0000 mg | ORAL_CAPSULE | Freq: Every day | ORAL | 5 refills | Status: DC

## 2020-05-08 ENCOUNTER — Encounter: Payer: Self-pay | Admitting: Family Medicine

## 2020-05-19 ENCOUNTER — Other Ambulatory Visit: Payer: Self-pay | Admitting: "Endocrinology

## 2020-05-20 ENCOUNTER — Other Ambulatory Visit: Payer: Self-pay | Admitting: "Endocrinology

## 2020-05-20 ENCOUNTER — Ambulatory Visit (INDEPENDENT_AMBULATORY_CARE_PROVIDER_SITE_OTHER): Payer: Medicare Other | Admitting: Ophthalmology

## 2020-05-20 ENCOUNTER — Other Ambulatory Visit: Payer: Self-pay | Admitting: Family Medicine

## 2020-05-20 ENCOUNTER — Other Ambulatory Visit: Payer: Self-pay | Admitting: *Deleted

## 2020-05-20 ENCOUNTER — Other Ambulatory Visit: Payer: Self-pay

## 2020-05-20 ENCOUNTER — Encounter: Payer: Self-pay | Admitting: Family Medicine

## 2020-05-20 ENCOUNTER — Encounter (INDEPENDENT_AMBULATORY_CARE_PROVIDER_SITE_OTHER): Payer: Self-pay | Admitting: Ophthalmology

## 2020-05-20 DIAGNOSIS — E113551 Type 2 diabetes mellitus with stable proliferative diabetic retinopathy, right eye: Secondary | ICD-10-CM

## 2020-05-20 DIAGNOSIS — E113552 Type 2 diabetes mellitus with stable proliferative diabetic retinopathy, left eye: Secondary | ICD-10-CM | POA: Diagnosis not present

## 2020-05-20 DIAGNOSIS — H35352 Cystoid macular degeneration, left eye: Secondary | ICD-10-CM | POA: Diagnosis not present

## 2020-05-20 DIAGNOSIS — H35072 Retinal telangiectasis, left eye: Secondary | ICD-10-CM

## 2020-05-20 MED ORDER — POTASSIUM CHLORIDE CRYS ER 10 MEQ PO TBCR: 10.0000 meq | EXTENDED_RELEASE_TABLET | Freq: Three times a day (TID) | ORAL | 1 refills | Status: DC

## 2020-05-20 NOTE — Assessment & Plan Note (Signed)
Continue on nightly CPAP use.  Condition stable.  Observe

## 2020-05-20 NOTE — Progress Notes (Signed)
05/20/2020     CHIEF COMPLAINT Patient presents for Retina Follow Up   HISTORY OF PRESENT ILLNESS: Kendra Hanson is a 59 y.o. female who presents to the clinic today for:   HPI    Retina Follow Up    Diagnosis: Mac Tel.  In both eyes.  Severity is moderate.  Duration of 7 weeks.  Since onset it is stable.  I, the attending physician,  performed the HPI with the patient and updated documentation appropriately.          Comments    7 Week Mac Tel and CME f\u OU. OCT Pt has had Cat SX on OU. Pt states she is using CPAP some nights. Denies any complaints. BGL: 65       Last edited by [name] on 05/20/2020 10:07 AM. (History)      Referring physician: Fayrene Helper, MD [address], [address] [place],  [place] [address]  HISTORICAL INFORMATION:   Selected notes from the MEDICAL RECORD NUMBER    Lab Results  Component Value Date   HGBA1C 7.8 (A) 03/19/2020     CURRENT MEDICATIONS: Current Outpatient Medications (Ophthalmic Drugs)  Medication Sig  . atropine 1 % ophthalmic solution   . brimonidine (ALPHAGAN) 0.2 % ophthalmic solution Place 1 drop into the right eye 3 (three) times daily.  Marland Kitchen ketorolac (ACULAR) 0.5 % ophthalmic solution   . ofloxacin (OCUFLOX) 0.3 % ophthalmic solution 1 drop 4 (four) times daily.  . prednisoLONE acetate (PRED FORTE) 1 % ophthalmic suspension Place 1 drop into the right eye 4 (four) times daily.   No current facility-administered medications for this visit. (Ophthalmic Drugs)   Current Outpatient Medications (Other)  Medication Sig  . acetaminophen (TYLENOL) 500 MG tablet Take 500 mg by mouth every 6 (six) hours as needed.  Marland Kitchen azelastine (ASTELIN) 0.1 % nasal spray Place 2 sprays into both nostrils 2 (two) times daily. Use in each nostril as directed  . benzonatate (TESSALON) 100 MG capsule Take 1 capsule (100 mg total) by mouth 2 (two) times daily as needed for cough.  . Blood Glucose Monitoring Suppl (ACCU-CHEK GUIDE  ME) w/Device KIT 1 Piece by Does not apply route as directed.  . cetirizine (ZYRTEC) 10 MG tablet Take 10 mg by mouth daily. (Patient not taking: Reported on 05/05/2020)  . Continuous Blood Gluc Sensor (FREESTYLE LIBRE 14 DAY SENSOR) MISC Inject 1 each into the skin every 14 (fourteen) days. Use as directed.  . cyclobenzaprine (FLEXERIL) 10 MG tablet Take 1 tablet (10 mg total) by mouth 3 (three) times daily as needed for muscle spasms.  Marland Kitchen diltiazem (CARDIZEM CD) 360 MG 24 hr capsule Take 1 capsule (360 mg total) by mouth daily.  Mariane Baumgarten Calcium (STOOL SOFTENER PO) Take by mouth daily.  . ferrous sulfate 325 (65 FE) MG tablet Take 325 mg by mouth daily with breakfast.  . glipiZIDE (GLUCOTROL XL) 5 MG 24 hr tablet Take 1 tablet (5 mg total) by mouth 2 (two) times daily with a meal.  . GLOBAL EASE INJECT PEN NEEDLES 31G X 8 MM MISC USE UP TO 5 TIMES A DAY AS DIRECTED.  Marland Kitchen glucose blood (ACCU-CHEK GUIDE) test strip Use as instructed to test blood glucose four times daily.  . insulin isophane & regular human (HUMULIN 70/30 KWIKPEN) (70-30) 100 UNIT/ML KwikPen INJECT 44 UNITS INTO THE  SKIN AT BREAKFAST AND 44 UNITS AT SUPPER.  . Lancets (FREESTYLE) lancets Use as instructed three  times daily dx e11.65  . mometasone (NASONEX) 50 MCG/ACT nasal spray Place 2 sprays into the nose daily.  . montelukast (SINGULAIR) 10 MG tablet Take 1 tablet (10 mg total) by mouth at bedtime.  . ondansetron (ZOFRAN) 4 MG tablet Take 1 tablet (4 mg total) by mouth 2 (two) times daily as needed for nausea or vomiting.  . pantoprazole (PROTONIX) 20 MG tablet TAKE 1 TABLET BY MOUTH  DAILY  . potassium chloride (KLOR-CON) 10 MEQ tablet TAKE 1 TABLET BY MOUTH 3  TIMES DAILY  . rosuvastatin (CRESTOR) 10 MG tablet TAKE 1 TABLET BY MOUTH  DAILY  . Semaglutide, 1 MG/DOSE, (OZEMPIC, 1 MG/DOSE,) 2 MG/1.5ML SOPN Inject 0.5 mg into the skin once a week.  . simethicone (MYLICON) 80 MG chewable tablet Chew 80 mg by mouth every 6 (six)  hours as needed for flatulence.  . torsemide (DEMADEX) 20 MG tablet Take one tab by mouth (42m) every other day, alternating with two tabs (452m every other day  . UNABLE TO FIND Diabetes shoes x 1 and inserts x 3 Dx:E11.29  . UNABLE TO FIND Compression hose (knee high) 15-2060m pair Dx bilateral leg swelling  . UNABLE TO FIND Diabetic shoes x 1 inserts x 3  Dx E11.9  . vitamin B-12 (CYANOCOBALAMIN) 100 MCG tablet Take 100 mcg by mouth daily.   No current facility-administered medications for this visit. (Other)      REVIEW OF SYSTEMS: ROS    Positive for: Endocrine   Last edited by ClaTilda Franco 05/20/2020 10:07 AM. (History)       ALLERGIES Allergies  Allergen Reactions  . Nsaids     Kidney function   . Diclofenac Other (See Comments)    hallucination  . Oxycodone     Vomiting     PAST MEDICAL HISTORY Past Medical History:  Diagnosis Date  . Anemia   . Fibroids    Uterine  . GERD (gastroesophageal reflux disease)   . Hyperlipidemia 2008   Lipid profile in 04/2011:136, 53, 43  . Hypertension 2008   Normal CBC and CMet in 2012; negative stress nuclear in 2006- patient asymptomatic  . Insulin dependent diabetes mellitus 1996  . Multiple allergies    perennial  . Normocytic normochromic anemia 12/27/2015  . Obesity   . PONV (postoperative nausea and vomiting)   . Sleep apnea    CPAP   Past Surgical History:  Procedure Laterality Date  . CATARACT EXTRACTION W/PHACO Right   . COLONOSCOPY N/A 04/05/2015   Procedure: COLONOSCOPY;  Surgeon: NajRogene HoustonD;  Location: [facility];  Service: Endoscopy;  Laterality: N/A;  730  . DENTAL SURGERY    . ESOPHAGOGASTRODUODENOSCOPY N/A 01/21/2016   Procedure: ESOPHAGOGASTRODUODENOSCOPY (EGD);  Surgeon: NajRogene HoustonD;  Location: [facility];  Service: Endoscopy;  Laterality: N/A;  11:15  . REFRACTIVE SURGERY  2011   Bilateral, two seperate occasions first in 2006  . REMOVAL OF IMPLANT    . RETINAL  DETACHMENT SURGERY Bilateral 05/2005    FAMILY HISTORY Family History  Problem Relation Age of Onset  . Hypertension Mother   . Diabetes Mother   . Kidney failure Mother   . Stroke Mother   . Kidney failure Father   . Diabetes Father   . Heart attack Father   . COPD Sister   . Diabetes Brother   . Diabetes Brother   . Diabetes Brother   . Diabetes Brother   . Diabetes Brother   .  Hypertension Brother     SOCIAL HISTORY Social History   Tobacco Use  . Smoking status: Never Smoker  . Smokeless tobacco: Never Used  Vaping Use  . Vaping Use: Never used  Substance Use Topics  . Alcohol use: No  . Drug use: No         OPHTHALMIC EXAM:  Base Eye Exam    Visual Acuity (Snellen - Linear)      Right Left   Dist  20/20 -2 20/25 -1       Tonometry (Tonopen, 10:13 AM)      Right Left   Pressure 19 19       Pupils      Pupils Dark Light Shape React APD   Right PERRL 2 2 Round Minimal None   Left PERRL 2 2 Round Minimal None       Visual Fields (Counting fingers)      Left Right    Full Full       Neuro/Psych    Oriented x3: Yes   Mood/Affect: Normal       Dilation    Both eyes: 1.0% Mydriacyl, 2.5% Phenylephrine @ 10:13 AM        Slit Lamp and Fundus Exam    External Exam      Right Left   External Normal Normal       Slit Lamp Exam      Right Left   Lids/Lashes Normal Normal   Conjunctiva/Sclera White and quiet White and quiet   Cornea Clear Clear   Anterior Chamber Deep and quiet Deep and quiet   Iris Round and reactive Round and reactive   Lens Centered posterior chamber intraocular lens Centered posterior chamber intraocular lens   Anterior Vitreous Normal Normal       Fundus Exam      Right Left   Posterior Vitreous Normal Normal   Disc Normal Normal   C/D Ratio 0.5 0.4   Macula Focal laser scars, no macular thickening  Microaneurysms, Focal laser scars, no macular thickening   Vessels Proliferative diabetic retinopathy, quiescent  Proliferative diabetic retinopathy, quiescent   Periphery Good PRP peripherally Good PRP peripherally          IMAGING AND PROCEDURES  Imaging and Procedures for 05/20/20  OCT, Retina - OU - Both Eyes       Right Eye Quality was good. Scan locations included subfoveal. Central Foveal Thickness: 240. Progression has been stable.   Left Eye Quality was good. Scan locations included subfoveal. Central Foveal Thickness: 231. Progression has worsened. Findings include cystoid macular edema.   Notes  Mild perifoveal CME OS, overall mostly stable.  Normal foveal contour.  This does suggest this as macular telangiectasis.  This could be residual CSME.  I do not believe it is.                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  Condition stable.  Observe      ICD-10-CM   1. 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 Cystoid macular edema of left eye  H35.352 OCT, Retina - OU - Both Eyes  3. 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 determined by examination associated with type 2 diabetes mellitus ([place])  D66.4403   4. 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 determined by examination associated with type 2 diabetes mellitus ([place])  K74.2595  1.  Patient continues on CPAP use "most nights ", and enjoys enhanced sense of wellbeing post CPAP use.  2.  No active CSME technically, will therefore simply encourage ongoing CPAP use for possible macular telangiectasis superimposed upon underlying prior Quiescent proliferative diabetic retinopathy.  3.  Ophthalmic Meds Ordered this visit:  No orders of the defined types were placed in this encounter.      Return in about 6 months (around 11/20/2020) for DILATE OU, OCT.  There are no Patient Instructions on file for this visit.   Explained the diagnoses, plan, and follow up with the patient and they  expressed understanding.  Patient expressed understanding of the importance of proper follow up care.   Clent Demark Tomoko Sandra M.D. Diseases & Surgery of the Retina and Vitreous [facility] [place] 05/20/20     Abbreviations: M myopia (nearsighted); A astigmatism; H hyperopia (farsighted); P presbyopia; Mrx spectacle prescription;  CTL contact lenses; OD right eye; OS left eye; OU both eyes  XT exotropia; ET esotropia; PEK punctate epithelial keratitis; PEE punctate epithelial erosions; DES dry eye syndrome; MGD meibomian gland dysfunction; ATs artificial tears; PFAT's preservative free artificial tears; House nuclear sclerotic cataract; PSC posterior subcapsular cataract; ERM epi-retinal membrane; PVD posterior vitreous detachment; RD retinal detachment; DM diabetes mellitus; DR diabetic retinopathy; NPDR non-proliferative diabetic retinopathy; PDR proliferative diabetic retinopathy; CSME clinically significant macular edema; DME diabetic macular edema; dbh dot blot hemorrhages; CWS cotton wool spot; POAG primary open angle glaucoma; C/D cup-to-disc ratio; HVF humphrey visual field; GVF goldmann visual field; OCT optical coherence tomography; IOP intraocular pressure; BRVO Branch retinal vein occlusion; CRVO central retinal vein occlusion; CRAO central retinal artery occlusion; BRAO branch retinal artery occlusion; RT retinal tear; SB scleral buckle; PPV pars plana vitrectomy; VH Vitreous hemorrhage; PRP panretinal laser photocoagulation; IVK intravitreal kenalog; VMT vitreomacular traction; MH Macular hole;  NVD neovascularization of the disc; NVE neovascularization elsewhere; AREDS age related eye disease study; ARMD age related macular degeneration; POAG primary open angle glaucoma; EBMD epithelial/anterior basement membrane dystrophy; ACIOL anterior chamber intraocular lens; IOL intraocular lens; PCIOL posterior chamber intraocular lens; Phaco/IOL phacoemulsification with intraocular lens placement;  [place] photorefractive keratectomy; LASIK laser assisted in situ keratomileusis; HTN hypertension; DM diabetes mellitus; COPD chronic obstructive pulmonary disease

## 2020-05-24 ENCOUNTER — Encounter (INDEPENDENT_AMBULATORY_CARE_PROVIDER_SITE_OTHER): Payer: Medicare Other | Admitting: Ophthalmology

## 2020-05-26 ENCOUNTER — Other Ambulatory Visit: Payer: Self-pay

## 2020-05-26 ENCOUNTER — Encounter (HOSPITAL_COMMUNITY): Payer: Self-pay

## 2020-05-26 ENCOUNTER — Ambulatory Visit (HOSPITAL_COMMUNITY): Payer: Medicare Other | Attending: Physician Assistant

## 2020-05-26 DIAGNOSIS — G8929 Other chronic pain: Secondary | ICD-10-CM

## 2020-05-26 DIAGNOSIS — M545 Low back pain, unspecified: Secondary | ICD-10-CM

## 2020-05-26 DIAGNOSIS — R262 Difficulty in walking, not elsewhere classified: Secondary | ICD-10-CM

## 2020-05-26 DIAGNOSIS — M25562 Pain in left knee: Secondary | ICD-10-CM | POA: Insufficient documentation

## 2020-05-26 DIAGNOSIS — M25561 Pain in right knee: Secondary | ICD-10-CM | POA: Diagnosis not present
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
## 2020-05-27 ENCOUNTER — Ambulatory Visit (INDEPENDENT_AMBULATORY_CARE_PROVIDER_SITE_OTHER): Payer: Medicare Other | Admitting: Gastroenterology

## 2020-06-02 ENCOUNTER — Other Ambulatory Visit: Payer: Self-pay

## 2020-06-02 ENCOUNTER — Ambulatory Visit (HOSPITAL_COMMUNITY): Payer: Medicare Other | Admitting: Physical Therapy

## 2020-06-02 DIAGNOSIS — M545 Low back pain, unspecified: Secondary | ICD-10-CM

## 2020-06-02 DIAGNOSIS — M25561 Pain in right knee: Secondary | ICD-10-CM

## 2020-06-02 DIAGNOSIS — M25562 Pain in left knee: Secondary | ICD-10-CM | POA: Diagnosis not present

## 2020-06-02 DIAGNOSIS — R262 Difficulty in walking, not elsewhere classified: Secondary | ICD-10-CM | POA: Diagnosis not present

## 2020-06-02 DIAGNOSIS — G8929 Other chronic pain: Secondary | ICD-10-CM
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
## 2020-06-03 ENCOUNTER — Ambulatory Visit (HOSPITAL_COMMUNITY): Payer: Medicare Other

## 2020-06-03 ENCOUNTER — Encounter (HOSPITAL_COMMUNITY): Payer: Self-pay

## 2020-06-03 ENCOUNTER — Other Ambulatory Visit: Payer: Self-pay

## 2020-06-03 DIAGNOSIS — M545 Low back pain, unspecified: Secondary | ICD-10-CM

## 2020-06-03 DIAGNOSIS — R262 Difficulty in walking, not elsewhere classified: Secondary | ICD-10-CM

## 2020-06-03 DIAGNOSIS — M25562 Pain in left knee: Secondary | ICD-10-CM

## 2020-06-03 DIAGNOSIS — G8929 Other chronic pain: Secondary | ICD-10-CM | POA: Diagnosis not present

## 2020-06-03 DIAGNOSIS — M25561 Pain in right knee: Secondary | ICD-10-CM | POA: Diagnosis not present
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
## 2020-06-10 ENCOUNTER — Other Ambulatory Visit: Payer: Self-pay | Admitting: "Endocrinology

## 2020-06-11 ENCOUNTER — Ambulatory Visit (HOSPITAL_COMMUNITY): Payer: Medicare Other | Admitting: Physical Therapy

## 2020-06-11 ENCOUNTER — Other Ambulatory Visit: Payer: Self-pay

## 2020-06-11 ENCOUNTER — Encounter (HOSPITAL_COMMUNITY): Payer: Self-pay | Admitting: Physical Therapy

## 2020-06-11 DIAGNOSIS — M25561 Pain in right knee: Secondary | ICD-10-CM | POA: Diagnosis not present

## 2020-06-11 DIAGNOSIS — R262 Difficulty in walking, not elsewhere classified: Secondary | ICD-10-CM

## 2020-06-11 DIAGNOSIS — M25562 Pain in left knee: Secondary | ICD-10-CM

## 2020-06-11 DIAGNOSIS — M545 Low back pain, unspecified: Secondary | ICD-10-CM

## 2020-06-11 DIAGNOSIS — G8929 Other chronic pain: Secondary | ICD-10-CM | POA: Diagnosis not present
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
## 2020-06-15 ENCOUNTER — Other Ambulatory Visit: Payer: Self-pay

## 2020-06-15 ENCOUNTER — Ambulatory Visit (HOSPITAL_COMMUNITY): Payer: Medicare Other | Attending: Physician Assistant | Admitting: Physical Therapy

## 2020-06-15 DIAGNOSIS — G8929 Other chronic pain: Secondary | ICD-10-CM | POA: Insufficient documentation

## 2020-06-15 DIAGNOSIS — M545 Low back pain, unspecified: Secondary | ICD-10-CM

## 2020-06-15 DIAGNOSIS — M25561 Pain in right knee: Secondary | ICD-10-CM | POA: Diagnosis not present

## 2020-06-15 DIAGNOSIS — R262 Difficulty in walking, not elsewhere classified: Secondary | ICD-10-CM | POA: Diagnosis not present

## 2020-06-15 DIAGNOSIS — M25562 Pain in left knee: Secondary | ICD-10-CM | POA: Insufficient documentation
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
## 2020-06-17 ENCOUNTER — Other Ambulatory Visit: Payer: Self-pay

## 2020-06-17 ENCOUNTER — Ambulatory Visit (HOSPITAL_COMMUNITY): Payer: Medicare Other | Admitting: Physical Therapy

## 2020-06-17 DIAGNOSIS — M545 Low back pain, unspecified: Secondary | ICD-10-CM

## 2020-06-17 DIAGNOSIS — M25562 Pain in left knee: Secondary | ICD-10-CM | POA: Diagnosis not present

## 2020-06-17 DIAGNOSIS — G8929 Other chronic pain: Secondary | ICD-10-CM

## 2020-06-17 DIAGNOSIS — M25561 Pain in right knee: Secondary | ICD-10-CM

## 2020-06-17 DIAGNOSIS — R262 Difficulty in walking, not elsewhere classified: Secondary | ICD-10-CM
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
## 2020-06-22 ENCOUNTER — Ambulatory Visit (HOSPITAL_COMMUNITY): Payer: Medicare Other | Admitting: Physical Therapy

## 2020-06-22 ENCOUNTER — Other Ambulatory Visit: Payer: Self-pay

## 2020-06-22 DIAGNOSIS — M25561 Pain in right knee: Secondary | ICD-10-CM | POA: Diagnosis not present

## 2020-06-22 DIAGNOSIS — R262 Difficulty in walking, not elsewhere classified: Secondary | ICD-10-CM

## 2020-06-22 DIAGNOSIS — G8929 Other chronic pain: Secondary | ICD-10-CM

## 2020-06-22 DIAGNOSIS — M25562 Pain in left knee: Secondary | ICD-10-CM

## 2020-06-22 DIAGNOSIS — M545 Low back pain, unspecified: Secondary | ICD-10-CM
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
## 2020-06-24 ENCOUNTER — Encounter (HOSPITAL_COMMUNITY): Payer: Self-pay | Admitting: Physical Therapy

## 2020-06-24 ENCOUNTER — Other Ambulatory Visit: Payer: Self-pay

## 2020-06-24 ENCOUNTER — Ambulatory Visit (HOSPITAL_COMMUNITY): Payer: Medicare Other | Admitting: Physical Therapy

## 2020-06-24 DIAGNOSIS — M25562 Pain in left knee: Secondary | ICD-10-CM

## 2020-06-24 DIAGNOSIS — M545 Low back pain, unspecified: Secondary | ICD-10-CM

## 2020-06-24 DIAGNOSIS — R262 Difficulty in walking, not elsewhere classified: Secondary | ICD-10-CM | POA: Diagnosis not present

## 2020-06-24 DIAGNOSIS — G8929 Other chronic pain: Secondary | ICD-10-CM

## 2020-06-24 DIAGNOSIS — M25561 Pain in right knee: Secondary | ICD-10-CM | POA: Diagnosis not present
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
## 2020-06-28 DIAGNOSIS — I5032 Chronic diastolic (congestive) heart failure: Secondary | ICD-10-CM | POA: Diagnosis not present

## 2020-06-28 DIAGNOSIS — N189 Chronic kidney disease, unspecified: Secondary | ICD-10-CM | POA: Diagnosis not present

## 2020-06-28 DIAGNOSIS — E211 Secondary hyperparathyroidism, not elsewhere classified: Secondary | ICD-10-CM | POA: Diagnosis not present

## 2020-06-28 DIAGNOSIS — I129 Hypertensive chronic kidney disease with stage 1 through stage 4 chronic kidney disease, or unspecified chronic kidney disease: Secondary | ICD-10-CM | POA: Diagnosis not present

## 2020-06-28 DIAGNOSIS — E1122 Type 2 diabetes mellitus with diabetic chronic kidney disease: Secondary | ICD-10-CM | POA: Diagnosis not present

## 2020-06-29 ENCOUNTER — Ambulatory Visit (HOSPITAL_COMMUNITY): Payer: Medicare Other | Admitting: Physical Therapy

## 2020-06-29 ENCOUNTER — Other Ambulatory Visit: Payer: Self-pay

## 2020-06-29 DIAGNOSIS — M545 Low back pain, unspecified: Secondary | ICD-10-CM

## 2020-06-29 DIAGNOSIS — M25561 Pain in right knee: Secondary | ICD-10-CM | POA: Diagnosis not present

## 2020-06-29 DIAGNOSIS — G8929 Other chronic pain: Secondary | ICD-10-CM

## 2020-06-29 DIAGNOSIS — M25562 Pain in left knee: Secondary | ICD-10-CM | POA: Diagnosis not present

## 2020-06-29 DIAGNOSIS — R262 Difficulty in walking, not elsewhere classified: Secondary | ICD-10-CM | POA: Diagnosis not present
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
## 2020-07-01 ENCOUNTER — Encounter (INDEPENDENT_AMBULATORY_CARE_PROVIDER_SITE_OTHER): Payer: Medicare Other | Admitting: Ophthalmology

## 2020-07-01 ENCOUNTER — Other Ambulatory Visit: Payer: Self-pay

## 2020-07-01 ENCOUNTER — Encounter (HOSPITAL_COMMUNITY): Payer: Self-pay | Admitting: Physical Therapy

## 2020-07-01 ENCOUNTER — Ambulatory Visit (HOSPITAL_COMMUNITY): Payer: Medicare Other | Admitting: Physical Therapy

## 2020-07-01 ENCOUNTER — Telehealth: Payer: Self-pay

## 2020-07-01 DIAGNOSIS — M545 Low back pain, unspecified: Secondary | ICD-10-CM

## 2020-07-01 DIAGNOSIS — M25561 Pain in right knee: Secondary | ICD-10-CM | POA: Diagnosis not present

## 2020-07-01 DIAGNOSIS — R262 Difficulty in walking, not elsewhere classified: Secondary | ICD-10-CM

## 2020-07-01 DIAGNOSIS — M25562 Pain in left knee: Secondary | ICD-10-CM

## 2020-07-01 DIAGNOSIS — G8929 Other chronic pain: Secondary | ICD-10-CM | POA: Diagnosis not present

## 2020-07-01 NOTE — Telephone Encounter (Signed)
Jury Duty Excuse  Noted  Copied Sleeved
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
## 2020-07-02 ENCOUNTER — Encounter: Payer: Self-pay | Admitting: Family Medicine

## 2020-07-03 DIAGNOSIS — I10 Essential (primary) hypertension: Secondary | ICD-10-CM

## 2020-07-05 ENCOUNTER — Other Ambulatory Visit: Payer: Self-pay

## 2020-07-05 ENCOUNTER — Ambulatory Visit (HOSPITAL_COMMUNITY): Payer: Medicare Other

## 2020-07-05 DIAGNOSIS — M545 Low back pain, unspecified: Secondary | ICD-10-CM

## 2020-07-05 DIAGNOSIS — M25561 Pain in right knee: Secondary | ICD-10-CM | POA: Diagnosis not present

## 2020-07-05 DIAGNOSIS — G8929 Other chronic pain: Secondary | ICD-10-CM | POA: Diagnosis not present

## 2020-07-05 DIAGNOSIS — M25562 Pain in left knee: Secondary | ICD-10-CM

## 2020-07-05 DIAGNOSIS — R262 Difficulty in walking, not elsewhere classified: Secondary | ICD-10-CM | POA: Diagnosis not present

## 2020-07-05 NOTE — Telephone Encounter (Signed)
Completed and pt notified via mychart
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
## 2020-07-07 ENCOUNTER — Encounter (HOSPITAL_COMMUNITY): Payer: Self-pay

## 2020-07-07 ENCOUNTER — Other Ambulatory Visit: Payer: Self-pay

## 2020-07-07 ENCOUNTER — Ambulatory Visit (HOSPITAL_COMMUNITY): Payer: Medicare Other

## 2020-07-07 ENCOUNTER — Telehealth: Payer: Self-pay

## 2020-07-07 DIAGNOSIS — M25562 Pain in left knee: Secondary | ICD-10-CM | POA: Diagnosis not present

## 2020-07-07 DIAGNOSIS — R262 Difficulty in walking, not elsewhere classified: Secondary | ICD-10-CM

## 2020-07-07 DIAGNOSIS — G8929 Other chronic pain: Secondary | ICD-10-CM

## 2020-07-07 DIAGNOSIS — M25561 Pain in right knee: Secondary | ICD-10-CM | POA: Diagnosis not present

## 2020-07-07 DIAGNOSIS — M545 Low back pain, unspecified: Secondary | ICD-10-CM

## 2020-07-07 NOTE — Telephone Encounter (Signed)
You already filled out a form, and I printed the standard jury duty excuse. Do you want me to do another note?

## 2020-07-07 NOTE — Telephone Encounter (Signed)
Pt is calling needs a letter on letterhead, of her chronic conditions to be excused from jury duty

## 2020-07-07 NOTE — Telephone Encounter (Signed)
I have not needed to do this before, veriffy with her this is personal health information,  there should be protection of this info, if she is willing to release this info and wants the letter then you may follow through, and she should personally collect it
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
## 2020-07-08 DIAGNOSIS — R809 Proteinuria, unspecified: Secondary | ICD-10-CM | POA: Diagnosis not present

## 2020-07-08 DIAGNOSIS — N189 Chronic kidney disease, unspecified: Secondary | ICD-10-CM | POA: Diagnosis not present

## 2020-07-08 DIAGNOSIS — I129 Hypertensive chronic kidney disease with stage 1 through stage 4 chronic kidney disease, or unspecified chronic kidney disease: Secondary | ICD-10-CM | POA: Diagnosis not present

## 2020-07-08 DIAGNOSIS — I5033 Acute on chronic diastolic (congestive) heart failure: Secondary | ICD-10-CM | POA: Diagnosis not present

## 2020-07-08 NOTE — Telephone Encounter (Signed)
I have printed her problem list and she can collect this. This is all dr Moshe Cipro will provide

## 2020-07-09 ENCOUNTER — Other Ambulatory Visit: Payer: Self-pay | Admitting: "Endocrinology

## 2020-07-13 ENCOUNTER — Encounter (HOSPITAL_COMMUNITY): Payer: Self-pay

## 2020-07-13 ENCOUNTER — Other Ambulatory Visit: Payer: Self-pay

## 2020-07-13 ENCOUNTER — Ambulatory Visit (HOSPITAL_COMMUNITY): Payer: Medicare Other

## 2020-07-13 DIAGNOSIS — M545 Low back pain, unspecified: Secondary | ICD-10-CM

## 2020-07-13 DIAGNOSIS — M25562 Pain in left knee: Secondary | ICD-10-CM

## 2020-07-13 DIAGNOSIS — G8929 Other chronic pain: Secondary | ICD-10-CM | POA: Diagnosis not present

## 2020-07-13 DIAGNOSIS — M25561 Pain in right knee: Secondary | ICD-10-CM | POA: Diagnosis not present

## 2020-07-13 DIAGNOSIS — R262 Difficulty in walking, not elsewhere classified: Secondary | ICD-10-CM
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
## 2020-07-15 ENCOUNTER — Other Ambulatory Visit: Payer: Self-pay

## 2020-07-15 ENCOUNTER — Encounter (HOSPITAL_COMMUNITY): Payer: Self-pay | Admitting: Physical Therapy

## 2020-07-15 ENCOUNTER — Ambulatory Visit (HOSPITAL_COMMUNITY): Payer: Medicare Other | Attending: Physician Assistant | Admitting: Physical Therapy

## 2020-07-15 DIAGNOSIS — M25561 Pain in right knee: Secondary | ICD-10-CM | POA: Diagnosis not present

## 2020-07-15 DIAGNOSIS — M545 Low back pain, unspecified: Secondary | ICD-10-CM

## 2020-07-15 DIAGNOSIS — R262 Difficulty in walking, not elsewhere classified: Secondary | ICD-10-CM

## 2020-07-15 DIAGNOSIS — G8929 Other chronic pain: Secondary | ICD-10-CM | POA: Insufficient documentation

## 2020-07-15 DIAGNOSIS — M25562 Pain in left knee: Secondary | ICD-10-CM | POA: Diagnosis not present
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
## 2020-07-20 ENCOUNTER — Encounter (HOSPITAL_COMMUNITY): Payer: Self-pay | Admitting: Physical Therapy

## 2020-07-20 ENCOUNTER — Ambulatory Visit (HOSPITAL_COMMUNITY): Payer: Medicare Other | Admitting: Physical Therapy

## 2020-07-20 ENCOUNTER — Other Ambulatory Visit: Payer: Self-pay

## 2020-07-20 DIAGNOSIS — M25561 Pain in right knee: Secondary | ICD-10-CM | POA: Diagnosis not present

## 2020-07-20 DIAGNOSIS — G8929 Other chronic pain: Secondary | ICD-10-CM

## 2020-07-20 DIAGNOSIS — N189 Chronic kidney disease, unspecified: Secondary | ICD-10-CM | POA: Diagnosis not present

## 2020-07-20 DIAGNOSIS — M545 Low back pain, unspecified: Secondary | ICD-10-CM

## 2020-07-20 DIAGNOSIS — E1129 Type 2 diabetes mellitus with other diabetic kidney complication: Secondary | ICD-10-CM | POA: Diagnosis not present

## 2020-07-20 DIAGNOSIS — E1122 Type 2 diabetes mellitus with diabetic chronic kidney disease: Secondary | ICD-10-CM | POA: Diagnosis not present

## 2020-07-20 DIAGNOSIS — I5032 Chronic diastolic (congestive) heart failure: Secondary | ICD-10-CM | POA: Diagnosis not present

## 2020-07-20 DIAGNOSIS — M25562 Pain in left knee: Secondary | ICD-10-CM

## 2020-07-20 DIAGNOSIS — R262 Difficulty in walking, not elsewhere classified: Secondary | ICD-10-CM | POA: Diagnosis not present

## 2020-07-20 DIAGNOSIS — I129 Hypertensive chronic kidney disease with stage 1 through stage 4 chronic kidney disease, or unspecified chronic kidney disease: Secondary | ICD-10-CM | POA: Diagnosis not present
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
## 2020-07-22 ENCOUNTER — Other Ambulatory Visit: Payer: Self-pay

## 2020-07-22 ENCOUNTER — Ambulatory Visit (INDEPENDENT_AMBULATORY_CARE_PROVIDER_SITE_OTHER): Payer: Medicare Other | Admitting: Nurse Practitioner

## 2020-07-22 ENCOUNTER — Encounter: Payer: Self-pay | Admitting: Nurse Practitioner

## 2020-07-22 VITALS — BP 147/72 | HR 80 | Ht 65.0 in | Wt 325.4 lb

## 2020-07-22 DIAGNOSIS — I10 Essential (primary) hypertension: Secondary | ICD-10-CM

## 2020-07-22 DIAGNOSIS — E1122 Type 2 diabetes mellitus with diabetic chronic kidney disease: Secondary | ICD-10-CM

## 2020-07-22 DIAGNOSIS — IMO0002 Reserved for concepts with insufficient information to code with codable children: Secondary | ICD-10-CM

## 2020-07-22 DIAGNOSIS — E782 Mixed hyperlipidemia: Secondary | ICD-10-CM | POA: Diagnosis not present

## 2020-07-22 DIAGNOSIS — E1165 Type 2 diabetes mellitus with hyperglycemia: Secondary | ICD-10-CM

## 2020-07-22 DIAGNOSIS — E211 Secondary hyperparathyroidism, not elsewhere classified: Secondary | ICD-10-CM | POA: Diagnosis not present

## 2020-07-22 DIAGNOSIS — N189 Chronic kidney disease, unspecified: Secondary | ICD-10-CM | POA: Diagnosis not present

## 2020-07-22 DIAGNOSIS — N183 Chronic kidney disease, stage 3 unspecified: Secondary | ICD-10-CM | POA: Diagnosis not present

## 2020-07-22 DIAGNOSIS — E559 Vitamin D deficiency, unspecified: Secondary | ICD-10-CM

## 2020-07-22 DIAGNOSIS — I5032 Chronic diastolic (congestive) heart failure: Secondary | ICD-10-CM | POA: Diagnosis not present

## 2020-07-22 DIAGNOSIS — I129 Hypertensive chronic kidney disease with stage 1 through stage 4 chronic kidney disease, or unspecified chronic kidney disease: Secondary | ICD-10-CM | POA: Diagnosis not present

## 2020-07-22 LAB — POCT GLYCOSYLATED HEMOGLOBIN (HGB A1C): Hemoglobin A1C: 7.6 % — AB (ref 4.0–5.6)

## 2020-07-22 MED ORDER — GLIPIZIDE ER 5 MG PO TB24: 5.0000 mg | ORAL_TABLET | Freq: Two times a day (BID) | ORAL | 1 refills | Status: DC

## 2020-07-22 MED ORDER — HUMULIN 70/30 KWIKPEN (70-30) 100 UNIT/ML ~~LOC~~ SUPN: PEN_INJECTOR | SUBCUTANEOUS | 0 refills | Status: DC

## 2020-07-22 NOTE — Progress Notes (Signed)
07/22/2020, 10:36 AM    Endocrinology follow-up note   Subjective:    [name], female    DOB: [birth year].  Kendra Hanson is seen in follow-up in the management of her currently uncontrolled type 2 diabetes, hypertension, hyperlipidemia.   -PMD:   Fayrene Helper, MD.   Past Medical History:  Diagnosis Date  . Anemia   . Fibroids    Uterine  . GERD (gastroesophageal reflux disease)   . Hyperlipidemia 2008   Lipid profile in 04/2011:136, 53, 43  . Hypertension 2008   Normal CBC and CMet in 2012; negative stress nuclear in 2006- patient asymptomatic  . Insulin dependent diabetes mellitus 1996  . Multiple allergies    perennial  . Normocytic normochromic anemia 12/27/2015  . Obesity   . PONV (postoperative nausea and vomiting)   . Sleep apnea    CPAP   Past Surgical History:  Procedure Laterality Date  . CATARACT EXTRACTION W/PHACO Right   . COLONOSCOPY N/A 04/05/2015   Procedure: COLONOSCOPY;  Surgeon: Rogene Houston, MD;  Location: [facility];  Service: Endoscopy;  Laterality: N/A;  730  . DENTAL SURGERY    . ESOPHAGOGASTRODUODENOSCOPY N/A 01/21/2016   Procedure: ESOPHAGOGASTRODUODENOSCOPY (EGD);  Surgeon: Rogene Houston, MD;  Location: [facility];  Service: Endoscopy;  Laterality: N/A;  11:15  . REFRACTIVE SURGERY  2011   Bilateral, two seperate occasions first in 2006  . REMOVAL OF IMPLANT    . RETINAL DETACHMENT SURGERY Bilateral 05/2005   Social History   Socioeconomic History  . Marital status: Married    Spouse name: Not on file  . Number of children: Not on file  . Years of education: Not on file  . Highest education level: Not on file  Occupational History  . Occupation: Employed    Employer: [employer]  Tobacco Use  . Smoking status: Never Smoker  . Smokeless tobacco: Never Used  Vaping Use  . Vaping Use: Never used  Substance and Sexual Activity  . Alcohol use:  No  . Drug use: No  . Sexual activity: Yes    Birth control/protection: None, Implant  Other Topics Concern  . Not on file  Social History Narrative   Married with 2 children   Social Determinants of Health   Financial Resource Strain: Low Risk   . Difficulty of Paying Living Expenses: Not hard at all  Food Insecurity: No Food Insecurity  . Worried About Charity fundraiser in the Last Year: Never true  . Ran Out of Food in the Last Year: Never true  Transportation Needs: No Transportation Needs  . Lack of Transportation (Medical): No  . Lack of Transportation (Non-Medical): No  Physical Activity: Inactive  . Days of Exercise per Week: 0 days  . Minutes of Exercise per Session: 0 min  Stress: No Stress Concern Present  . Feeling of Stress : Not at all  Social Connections: Socially Integrated  . Frequency of Communication with Friends and Family: More than three times a week  . Frequency of Social Gatherings with Friends and Family: Never  . Attends Religious Services: More than 4 times per year  . Active Member of Clubs or Organizations: Yes  . Attends Archivist Meetings: More than 4 times  per year  . Marital Status: Married   Outpatient Encounter Medications as of 07/22/2020  Medication Sig  . acetaminophen (TYLENOL) 500 MG tablet Take 500 mg by mouth every 6 (six) hours as needed.  Marland Kitchen atropine 1 % ophthalmic solution   . azelastine (ASTELIN) 0.1 % nasal spray Place 2 sprays into both nostrils 2 (two) times daily. Use in each nostril as directed  . benzonatate (TESSALON) 100 MG capsule Take 1 capsule (100 mg total) by mouth 2 (two) times daily as needed for cough.  . Blood Glucose Monitoring Suppl (ACCU-CHEK GUIDE ME) w/Device KIT 1 Piece by Does not apply route as directed.  . brimonidine (ALPHAGAN) 0.2 % ophthalmic solution Place 1 drop into the right eye 3 (three) times daily.  . cetirizine (ZYRTEC) 10 MG tablet Take 10 mg by mouth daily.   . cyclobenzaprine  (FLEXERIL) 10 MG tablet Take 1 tablet (10 mg total) by mouth 3 (three) times daily as needed for muscle spasms.  Marland Kitchen diltiazem (CARDIZEM CD) 360 MG 24 hr capsule Take 1 capsule (360 mg total) by mouth daily.  Mariane Baumgarten Calcium (STOOL SOFTENER PO) Take by mouth daily.  . ferrous sulfate 325 (65 FE) MG tablet Take 325 mg by mouth daily with breakfast.  . glipiZIDE (GLUCOTROL XL) 5 MG 24 hr tablet Take 1 tablet (5 mg total) by mouth 2 (two) times daily with a meal.  . GLOBAL EASE INJECT PEN NEEDLES 31G X 8 MM MISC USE UP TO 5 TIMES A DAY AS DIRECTED.  Marland Kitchen glucose blood (ACCU-CHEK GUIDE) test strip Use as instructed to test blood glucose four times daily.  . insulin isophane & regular human (HUMULIN 70/30 KWIKPEN) (70-30) 100 UNIT/ML KwikPen INJECT 50 UNITS INTO THE SKIN AT BREAKFAST AND 44 UNITS AT SUPPER  . ketorolac (ACULAR) 0.5 % ophthalmic solution   . Lancets (FREESTYLE) lancets Use as instructed three times daily dx e11.65  . mometasone (NASONEX) 50 MCG/ACT nasal spray Place 2 sprays into the nose daily.  . montelukast (SINGULAIR) 10 MG tablet Take 1 tablet (10 mg total) by mouth at bedtime.  Marland Kitchen ofloxacin (OCUFLOX) 0.3 % ophthalmic solution 1 drop 4 (four) times daily.  . ondansetron (ZOFRAN) 4 MG tablet Take 1 tablet (4 mg total) by mouth 2 (two) times daily as needed for nausea or vomiting.  . pantoprazole (PROTONIX) 20 MG tablet TAKE 1 TABLET BY MOUTH  DAILY  . potassium chloride (KLOR-CON) 10 MEQ tablet Take 1 tablet (10 mEq total) by mouth 3 (three) times daily.  . prednisoLONE acetate (PRED FORTE) 1 % ophthalmic suspension Place 1 drop into the right eye 4 (four) times daily.  . rosuvastatin (CRESTOR) 10 MG tablet TAKE 1 TABLET BY MOUTH  DAILY  . rosuvastatin (CRESTOR) 5 MG tablet TAKE (1) TABLET BY MOUTH AT BEDTIME.  . Semaglutide, 1 MG/DOSE, (OZEMPIC, 1 MG/DOSE,) 2 MG/1.5ML SOPN Inject 0.5 mg into the skin once a week.  . simethicone (MYLICON) 80 MG chewable tablet Chew 80 mg by mouth  every 6 (six) hours as needed for flatulence.  . torsemide (DEMADEX) 20 MG tablet Take one tab by mouth (33m) every other day, alternating with two tabs (41m every other day  . UNABLE TO FIND Diabetes shoes x 1 and inserts x 3 Dx:E11.29  . UNABLE TO FIND Compression hose (knee high) 15-2030m pair Dx bilateral leg swelling  . UNABLE TO FIND Diabetic shoes x 1 inserts x 3  Dx E11.9  . vitamin B-12 (CYANOCOBALAMIN) 100  MCG tablet Take 100 mcg by mouth daily.  . [DISCONTINUED] Continuous Blood Gluc Sensor (FREESTYLE LIBRE 14 DAY SENSOR) MISC Inject 1 each into the skin every 14 (fourteen) days. Use as directed.  . [DISCONTINUED] glipiZIDE (GLUCOTROL XL) 5 MG 24 hr tablet Take 1 tablet (5 mg total) by mouth 2 (two) times daily with a meal.  . [DISCONTINUED] insulin isophane & regular human (HUMULIN 70/30 KWIKPEN) (70-30) 100 UNIT/ML KwikPen INJECT 44 UNITS INTO THE SKIN AT BREAKFAST AND 44 UNITS AT SUPPER   No facility-administered encounter medications on file as of 07/22/2020.    ALLERGIES: Allergies  Allergen Reactions  . Nsaids     Kidney function   . Diclofenac Other (See Comments)    hallucination  . Oxycodone     Vomiting     VACCINATION STATUS: Immunization History  Administered Date(s) Administered  . Influenza Split 08/14/2014  . Influenza,inj,Quad PF,6+ Mos 08/02/2018, 07/14/2019  . Moderna SARS-COVID-2 Vaccination 01/23/2020, 02/23/2020  . Pneumococcal Conjugate-13 02/01/2015  . Pneumococcal Polysaccharide-23 05/23/2005, 03/28/2016  . Tdap 10/08/2013  . Zoster Recombinat (Shingrix) 10/31/2018, 04/28/2019    Diabetes She presents for her follow-up diabetic visit. She has type 2 diabetes mellitus. Onset time: She was diagnosed at approximate age of 39 years. Her disease course has been improving. Pertinent negatives for hypoglycemia include no confusion, headaches, pallor, seizures or sweats. Pertinent negatives for diabetes include no chest pain, no fatigue, no  polydipsia, no polyphagia and no polyuria. There are no hypoglycemic complications. Symptoms are improving. Diabetic complications include nephropathy. Risk factors for coronary artery disease include diabetes mellitus, dyslipidemia, hypertension, obesity and sedentary lifestyle. Current diabetic treatment includes insulin injections and oral agent (monotherapy). She is compliant with treatment most of the time. Her weight is fluctuating minimally. She is following a generally unhealthy diet. When asked about meal planning, she reported none. She has not had a previous visit with a dietitian. She never participates in exercise. Her home blood glucose trend is decreasing steadily. Her breakfast blood glucose range is generally 110-130 mg/dl. Her overall blood glucose range is 140-180 mg/dl. (Patient presents today with her meter, no logs, showing near target fasting and above target postprandial glycemic profile.  Her POCT A1C today is 7.6%, essentially unchanged from last visit of 7.8%.  She does have some mild episodes of hypoglycemia related to timing of her meals.) An ACE inhibitor/angiotensin II receptor blocker is not being taken. She does not see a podiatrist.Eye exam is current.  Hyperlipidemia This is a chronic problem. The current episode started more than 1 year ago. The problem is uncontrolled. Recent lipid tests were reviewed and are high. Exacerbating diseases include chronic renal disease, diabetes and obesity. Factors aggravating her hyperlipidemia include fatty foods. Pertinent negatives include no chest pain, myalgias or shortness of breath. Current antihyperlipidemic treatment includes statins. The current treatment provides mild improvement of lipids. Compliance problems include adherence to diet and adherence to exercise.  Risk factors for coronary artery disease include dyslipidemia, diabetes mellitus, a sedentary lifestyle, post-menopausal, family history, hypertension and obesity.     Review of systems  Constitutional: + Minimally fluctuating body weight,  current  Body mass index is 54.15 kg/m. , + fatigue, no subjective hyperthermia, + cold intolerance Eyes: no blurry vision, no xerophthalmia ENT: no sore throat, no nodules palpated in throat, no dysphagia/odynophagia, no hoarseness Cardiovascular: no Chest Pain, no Shortness of Breath, no palpitations, + leg swelling Respiratory: no cough, no shortness of breath Gastrointestinal: no Nausea/Vomiting/Diarrhea Musculoskeletal: no muscle/joint aches  Skin: no rashes, no hyperemia Neurological: no tremors, no numbness, no tingling, no dizziness Psychiatric: no depression, no anxiety    Objective:    BP (!) 147/72 (BP Location: Left Arm, Patient Position: Sitting)   Pulse 80   Ht 5' 5"  (1.651 m)   Wt (!) 325 lb 6.4 oz (147.6 kg)   BMI 54.15 kg/m   Wt Readings from Last 3 Encounters:  07/22/20 (!) 325 lb 6.4 oz (147.6 kg)  05/05/20 (!) 325 lb 0.6 oz (147.4 kg)  04/29/20 (!) 334 lb (151.5 kg)    BP Readings from Last 3 Encounters:  07/22/20 (!) 147/72  05/05/20 138/72  04/29/20 (!) 126/56     Physical Exam- Limited  Constitutional:  Body mass index is 54.15 kg/m. , not in acute distress, normal state of mind Eyes:  EOMI, no exophthalmos Neck: Supple Thyroid: No gross goiter Cardiovascular: RRR, no murmers, rubs, or gallops, + pitting edema to BLE Respiratory: Adequate breathing efforts, no crackles, rales, rhonchi, or wheezing Musculoskeletal: no gross deformities, strength intact in all four extremities, no gross restriction of joint movements Skin:  no rashes, no hyperemia Neurological: no tremor with outstretched hands  Foot exam:   No rashes, ulcers, cuts, calluses, onychodystrophy.   Good pulses bilat.  Good sensation to 10 g monofilament bilat.  + bilateral pedal edema  CMP     Component Value Date/Time   NA 140 02/03/2020 1008   NA 141 06/04/2019 1059   K 4.1 02/03/2020 1008   CL  99 02/03/2020 1008   CO2 32 02/03/2020 1008   GLUCOSE 141 (H) 02/03/2020 1008   BUN 36 (H) 02/03/2020 1008   BUN 18 06/04/2019 1059   CREATININE 1.56 (H) 02/03/2020 1008   CALCIUM 9.3 02/03/2020 1008   PROT 7.3 02/03/2020 1008   PROT 7.2 06/04/2019 1059   ALBUMIN 4.1 06/04/2019 1059   AST 14 02/03/2020 1008   ALT 12 02/03/2020 1008   ALKPHOS 114 06/04/2019 1059   BILITOT 0.6 02/03/2020 1008   BILITOT 0.4 06/04/2019 1059   GFRNONAA 36 (L) 02/03/2020 1008   GFRAA 42 (L) 02/03/2020 1008     Diabetic Labs (most recent): Lab Results  Component Value Date   HGBA1C 7.6 (A) 07/22/2020   HGBA1C 7.8 (A) 03/19/2020   HGBA1C 7.5 (A) 12/16/2019    Lipid Panel     Component Value Date/Time   CHOL 202 (H) 09/02/2019 0846   CHOL 247 (H) 04/17/2019 1043   TRIG 108 09/02/2019 0846   HDL 49 (L) 09/02/2019 0846   HDL 52 04/17/2019 1043   CHOLHDL 4.1 09/02/2019 0846   VLDL 15 04/12/2017 1121   LDLCALC 131 (H) 09/02/2019 0846     Assessment & Plan:   1. Uncontrolled type 2 diabetes mellitus with stage 3 chronic kidney disease ([place])  - Kendra Hanson has currently uncontrolled symptomatic type 2 DM since 59 years of age.  Patient presents today with her meter, no logs, showing near target fasting and above target postprandial glycemic profile.  Her POCT A1C today is 7.6%, essentially unchanged from last visit of 7.8%.  She does have some mild episodes of hypoglycemia related to timing of her meals.  -her diabetes is complicated by stage 3 renal insufficiency and she remains at a high risk for more acute and chronic complications which include CAD, CVA, CKD, retinopathy, and neuropathy. These are all discussed in detail with her.  - I have counseled her on diet management and weight loss, by  adopting a carbohydrate restricted/protein rich diet.  - The patient admits there is a room for improvement in their diet and drink choices. -  Suggestion is made for the patient to avoid simple  carbohydrates from their diet including Cakes, Sweet Desserts / Pastries, Ice Cream, Soda (diet and regular), Sweet Tea, Candies, Chips, Cookies, Sweet Pastries,  Store Bought Juices, Alcohol in Excess of  1-2 drinks a day, Artificial Sweeteners, Coffee Creamer, and "Sugar-free" Products. This will help patient to have stable blood glucose profile and potentially avoid unintended weight gain.   - I encouraged the patient to switch to  unprocessed or minimally processed complex starch and increased protein intake (animal or plant source), fruits, and vegetables.   - Patient is advised to stick to a routine mealtimes to eat 3 meals  a day and avoid unnecessary snacks ( to snack only to correct hypoglycemia).  - I have approached her with the following individualized plan to manage diabetes and patient agrees:   -She wishes to stay on her premixed insulin.   -Based on her logs, she will tolerate slight increase in her morning dose of Humilin 70/30 to 50 units with breakfast, and keep same dose of 44 units with supper. -She is advised to continue Ozempic 0.5 mg SQ weekly (using samples), and Glipizide 5 mg po twice daily with meals.  -Encouraged her to continue monitoring blood glucose at least 3 times per day, before injecting insulin at breakfast and supper, and at bedtime and report to the clinic if glucose levels are less than 70 or greater than 200 for 3 tests in a row.  - she is not a candidate for metformin, SGLT2 inhibitors due to concurrent renal insufficiency.  - Patient specific target  A1c;  LDL, HDL, Triglycerides, and  Waist Circumference were discussed in detail.  2) Blood Pressure /Hypertension:  Her blood pressure is not controlled to target. She is advised to continue Cardizem 360 mg po daily.  She has a follow up appointment with her nephrologist today, advised her to discuss BP elevations with him.  3) Lipids/Hyperlipidemia:  Her most recent lipid panel from 08/2019 shows  uncontrolled LDL at 131.  She is advised to continue Crestor 10 mg po daily at bedtime.  Side effects and precautions discussed with her.  Will recheck lipid panel prior to next visit and adjust meds if necessary.  4) Weight management:  Her Body mass index is 54.15 kg/m.  This is clearly complicating her diabetes care.  She is a candidate for modest weight loss.  Her obesity is a result of positive caloric intake.  Carbs restrictions and exercise regimen discussed with her.  She is a good candidate for bariatric surgery, which she is not ready to consider at this time.  5) Chronic Care/Health Maintenance: -she  is on Statin medications and is encouraged to continue to follow up with Ophthalmology, Dentist, Podiatrist at least yearly or according to recommendations, and advised to  stay away from smoking. I have recommended yearly flu vaccine and pneumonia vaccination at least every 5 years; moderate intensity exercise for up to 150 minutes weekly; and  sleep for at least 7 hours a day.  - she is  advised to maintain close follow up with Fayrene Helper, MD for primary care needs, as well as her other providers for optimal and coordinated care.  - Time spent on this patient care encounter:  35 min, of which > 50% was spent in  counseling and the  rest reviewing her blood glucose logs , discussing her hypoglycemia and hyperglycemia episodes, reviewing her current and  previous labs / studies  ( including abstraction from other facilities) and medications  doses and developing a  long term treatment plan and documenting her care.   Please refer to Patient Instructions for Blood Glucose Monitoring and Insulin/Medications Dosing Guide"  in media tab for additional information. Please  also refer to " Patient Self Inventory" in the Media  tab for reviewed elements of pertinent patient history.  Kendra Hanson participated in the discussions, expressed understanding, and voiced agreement with the above  plans.  All questions were answered to her satisfaction. she is encouraged to contact clinic should she have any questions or concerns prior to her return visit.    Follow up plan: - Return in about 4 months (around 11/21/2020) for Diabetes follow up, Previsit labs, Virtual visit ok.  Rayetta Pigg, FNP-BC [facility] Phone: [phone] Fax: [phone]  07/22/2020, 10:36 AM  This note was partially dictated with voice recognition software. Similar sounding words can be transcribed inadequately or may not  be corrected upon review.

## 2020-07-22 NOTE — Patient Instructions (Signed)

## 2020-07-23 ENCOUNTER — Encounter (HOSPITAL_COMMUNITY): Payer: Self-pay | Admitting: Physical Therapy

## 2020-07-23 ENCOUNTER — Ambulatory Visit (HOSPITAL_COMMUNITY): Payer: Medicare Other | Admitting: Physical Therapy

## 2020-07-23 DIAGNOSIS — G8929 Other chronic pain: Secondary | ICD-10-CM | POA: Diagnosis not present

## 2020-07-23 DIAGNOSIS — R262 Difficulty in walking, not elsewhere classified: Secondary | ICD-10-CM | POA: Diagnosis not present

## 2020-07-23 DIAGNOSIS — M25562 Pain in left knee: Secondary | ICD-10-CM

## 2020-07-23 DIAGNOSIS — M25561 Pain in right knee: Secondary | ICD-10-CM

## 2020-07-23 DIAGNOSIS — M545 Low back pain, unspecified: Secondary | ICD-10-CM
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
## 2020-07-23 NOTE — Patient Instructions (Signed)
HIP / KNEE: Extension - Standing    Squeeze glutes. Raise and lift leg backward. Keep knee straight or slightly bent. _10__ reps per set, _1-2__ sets per day, _7  days per week Hold onto a countertop.  Copyright  VHI. All rights reserved.

## 2020-08-10 ENCOUNTER — Other Ambulatory Visit: Payer: Self-pay | Admitting: "Endocrinology

## 2020-08-11 ENCOUNTER — Other Ambulatory Visit: Payer: Self-pay | Admitting: Family Medicine

## 2020-08-13 ENCOUNTER — Encounter: Payer: Self-pay | Admitting: Family Medicine

## 2020-08-13 ENCOUNTER — Other Ambulatory Visit: Payer: Self-pay | Admitting: *Deleted

## 2020-08-13 MED ORDER — ROSUVASTATIN CALCIUM 10 MG PO TABS
10.0000 mg | ORAL_TABLET | Freq: Every day | ORAL | 3 refills | Status: DC
Start: 2020-08-13 — End: 2020-11-24

## 2020-08-18 DIAGNOSIS — E1129 Type 2 diabetes mellitus with other diabetic kidney complication: Secondary | ICD-10-CM | POA: Diagnosis not present

## 2020-08-18 DIAGNOSIS — I129 Hypertensive chronic kidney disease with stage 1 through stage 4 chronic kidney disease, or unspecified chronic kidney disease: Secondary | ICD-10-CM | POA: Diagnosis not present

## 2020-08-18 DIAGNOSIS — I5032 Chronic diastolic (congestive) heart failure: Secondary | ICD-10-CM | POA: Diagnosis not present

## 2020-08-18 DIAGNOSIS — E1122 Type 2 diabetes mellitus with diabetic chronic kidney disease: Secondary | ICD-10-CM | POA: Diagnosis not present

## 2020-08-18 DIAGNOSIS — N189 Chronic kidney disease, unspecified: Secondary | ICD-10-CM | POA: Diagnosis not present

## 2020-08-19 DIAGNOSIS — H40013 Open angle with borderline findings, low risk, bilateral: Secondary | ICD-10-CM | POA: Diagnosis not present

## 2020-08-26 DIAGNOSIS — R809 Proteinuria, unspecified: Secondary | ICD-10-CM | POA: Diagnosis not present

## 2020-08-26 DIAGNOSIS — N189 Chronic kidney disease, unspecified: Secondary | ICD-10-CM | POA: Diagnosis not present

## 2020-08-26 DIAGNOSIS — I5032 Chronic diastolic (congestive) heart failure: Secondary | ICD-10-CM | POA: Diagnosis not present

## 2020-08-26 DIAGNOSIS — E211 Secondary hyperparathyroidism, not elsewhere classified: Secondary | ICD-10-CM | POA: Diagnosis not present

## 2020-08-26 DIAGNOSIS — I129 Hypertensive chronic kidney disease with stage 1 through stage 4 chronic kidney disease, or unspecified chronic kidney disease: Secondary | ICD-10-CM | POA: Diagnosis not present

## 2020-09-07 ENCOUNTER — Other Ambulatory Visit: Payer: Self-pay | Admitting: "Endocrinology

## 2020-09-14 ENCOUNTER — Other Ambulatory Visit: Payer: Self-pay

## 2020-09-14 ENCOUNTER — Encounter (INDEPENDENT_AMBULATORY_CARE_PROVIDER_SITE_OTHER): Payer: Self-pay | Admitting: Internal Medicine

## 2020-09-14 ENCOUNTER — Ambulatory Visit (INDEPENDENT_AMBULATORY_CARE_PROVIDER_SITE_OTHER): Payer: Medicare Other | Admitting: Internal Medicine

## 2020-09-14 VITALS — BP 127/56 | HR 86 | Temp 98.9°F | Ht 65.0 in | Wt 319.0 lb

## 2020-09-14 DIAGNOSIS — E611 Iron deficiency: Secondary | ICD-10-CM | POA: Diagnosis not present

## 2020-09-14 NOTE — Progress Notes (Signed)
Consulting physician: Dr. Ulice Bold, MD Primary care physician: Dr. Tula Nakayama, MD  Reason for consultation  Iron deficiency anemia  History of present illness  Kendra Hanson is 59 year old African-American female who is referred through courtesy of her nephrologist Dr. Theador Hawthorne for GI evaluation. Patient states she has history of iron deficiency anemia and received parenteral iron on 3 occasions maybe around 10 years ago.  Lately she had been feeling tired and also wanted to eat/chew ice all the time. She had blood work on 03/29/2020 and she was noted to be anemic with a hemoglobin of 11.7 and hematocrit of 37.3.  Serum iron was 39 TIBC 298 and saturation was 13%.  Patient was begun on p.o. iron and noted symptomatic improvement.  She had a lab studies repeated on 08/18/2020 and her hemoglobin is up to 12 g and serum iron was 77.  She feels better. Her stools have been black since she has been on iron.  No history of melena prior to that.  She also denies rectal bleeding abdominal pain or diarrhea.  She generally has 1-2 bowel movements per day.  Her appetite is good and she denies weight loss.  She has heartburn infrequently.  She has a history of GERD.  She has been watching her diet and has not taken PPI in the last 6 months.  She does noted flatulence which is not intractable.  She denies nausea or vomiting. She does not take OTC NSAIDs.  She was on low-dose aspirin which she stopped 1 year ago. Last menstrual period was 8 years ago. She recently was told that she had lymphedema and has been referred to the clinic for therapy. She has received both doses of Covid vaccine and getting ready to receive 3rd dose. She is presently on disability.  She still has residual numbness in fingers of left hand.  She had decompression of left carpal tunnel and left elbow.  Prior GI work-up as follows  She had EGD in March 2017 for GERD.  Esophageal mucosa was normal.  She had few gastric polyps and  biopsy revealed gastric fundic polyps.  Duodenal biopsy was normal.  She had screening colonoscopy in May 2016 and was normal exam other than external hemorrhoids.   Current Medications: Outpatient Encounter Medications as of 09/14/2020  Medication Sig  . acetaminophen (TYLENOL) 500 MG tablet Take 500 mg by mouth every 6 (six) hours as needed.  . Ascorbic Acid (VITAMIN C) 1000 MG tablet Take 500 mg by mouth daily.  Marland Kitchen azelastine (ASTELIN) 0.1 % nasal spray Place 2 sprays into both nostrils 2 (two) times daily. Use in each nostril as directed  . benzonatate (TESSALON) 100 MG capsule Take 1 capsule (100 mg total) by mouth 2 (two) times daily as needed for cough.  . Blood Glucose Monitoring Suppl (ACCU-CHEK GUIDE ME) w/Device KIT 1 Piece by Does not apply route as directed.  . cetirizine (ZYRTEC) 10 MG tablet Take 10 mg by mouth daily.   . cholecalciferol (VITAMIN D3) 25 MCG (1000 UNIT) tablet Take 1,000 Units by mouth daily.  . cyclobenzaprine (FLEXERIL) 10 MG tablet Take 1 tablet (10 mg total) by mouth 3 (three) times daily as needed for muscle spasms.  Marland Kitchen diltiazem (CARDIZEM CD) 360 MG 24 hr capsule Take 1 capsule (360 mg total) by mouth daily.  Mariane Baumgarten Calcium (STOOL SOFTENER PO) Take by mouth daily.  . ferrous sulfate 325 (65 FE) MG tablet Take 325 mg by mouth daily with breakfast.  . glipiZIDE (GLUCOTROL  XL) 5 MG 24 hr tablet Take 1 tablet (5 mg total) by mouth 2 (two) times daily with a meal.  . GLOBAL EASE INJECT PEN NEEDLES 31G X 8 MM MISC USE UP TO 5 TIMES A DAY AS DIRECTED.  Marland Kitchen glucose blood (ACCU-CHEK GUIDE) test strip USE TO TEST FOUR TIMES DAILY AS DIRECTED.  Marland Kitchen insulin isophane & regular human (HUMULIN 70/30 KWIKPEN) (70-30) 100 UNIT/ML KwikPen INJECT 50 UNITS INTO THE SKIN AT BREAKFAST AND 44 UNITS AT SUPPER  . Lancets (FREESTYLE) lancets Use as instructed three times daily dx e11.65  . metolazone (ZAROXOLYN) 2.5 MG tablet Take 2.5 mg by mouth daily.   . mometasone (NASONEX) 50  MCG/ACT nasal spray Place 2 sprays into the nose daily.  . montelukast (SINGULAIR) 10 MG tablet Take 1 tablet (10 mg total) by mouth at bedtime.  . ondansetron (ZOFRAN) 4 MG tablet Take 1 tablet (4 mg total) by mouth 2 (two) times daily as needed for nausea or vomiting.  . pantoprazole (PROTONIX) 20 MG tablet TAKE 1 TABLET BY MOUTH  DAILY  . potassium chloride (KLOR-CON) 10 MEQ tablet Take 1 tablet (10 mEq total) by mouth 3 (three) times daily.  . rosuvastatin (CRESTOR) 10 MG tablet Take 1 tablet (10 mg total) by mouth daily.  . Semaglutide, 1 MG/DOSE, (OZEMPIC, 1 MG/DOSE,) 2 MG/1.5ML SOPN Inject 0.5 mg into the skin once a week.  . simethicone (MYLICON) 80 MG chewable tablet Chew 80 mg by mouth every 6 (six) hours as needed for flatulence.  . torsemide (DEMADEX) 20 MG tablet Take one tab by mouth (27m) every other day, alternating with two tabs (429m every other day (Patient taking differently: Take 20 mg by mouth daily. T)  . UNABLE TO FIND Diabetes shoes x 1 and inserts x 3 Dx:E11.29  . UNABLE TO FIND Compression hose (knee high) 15-2035m pair Dx bilateral leg swelling  . UNABLE TO FIND Diabetic shoes x 1 inserts x 3  Dx E11.9  . vitamin B-12 (CYANOCOBALAMIN) 100 MCG tablet Take 100 mcg by mouth daily. (Patient not taking: Reported on 09/14/2020)  . [DISCONTINUED] atropine 1 % ophthalmic solution   . [DISCONTINUED] brimonidine (ALPHAGAN) 0.2 % ophthalmic solution Place 1 drop into the right eye 3 (three) times daily. (Patient not taking: Reported on 09/14/2020)  . [DISCONTINUED] ketorolac (ACULAR) 0.5 % ophthalmic solution  (Patient not taking: Reported on 09/14/2020)  . [DISCONTINUED] ofloxacin (OCUFLOX) 0.3 % ophthalmic solution 1 drop 4 (four) times daily. (Patient not taking: Reported on 09/14/2020)  . [DISCONTINUED] prednisoLONE acetate (PRED FORTE) 1 % ophthalmic suspension Place 1 drop into the right eye 4 (four) times daily. (Patient not taking: Reported on 09/14/2020)  .  [DISCONTINUED] rosuvastatin (CRESTOR) 5 MG tablet TAKE (1) TABLET BY MOUTH AT BEDTIME.   No facility-administered encounter medications on file as of 09/14/2020.   Past medical history Past Medical History:  Diagnosis Date  .  History of iron deficiency anemia   . Fibroids    Uterine  . GERD (gastroesophageal reflux disease)   . Hyperlipidemia 2008   Lipid profile in 04/2011:136, 53, 43  . Hypertension 2008   Normal CBC and CMet in 2012; negative stress nuclear in 2006- patient asymptomatic  . Insulin dependent diabetes mellitus 1996  . Multiple allergies    perennial  .  History of vitamin D deficiency corrected with therapy. 12/27/2015  . Obesity   . PONV (postoperative nausea and vomiting)   . Sleep apnea    CPAP  Past Surgical History:  Procedure Laterality Date  . CATARACT EXTRACTION W/PHACO Right   . COLONOSCOPY N/A 04/05/2015   Procedure: COLONOSCOPY;  Surgeon: Rogene Houston, MD;  [facility];  Service: Endoscopy;  Laterality: N/A;  730  . DENTAL SURGERY    . ESOPHAGOGASTRODUODENOSCOPY N/A 01/21/2016   Procedure: ESOPHAGOGASTRODUODENOSCOPY (EGD);  Surgeon: Rogene Houston, MD;  [facility];  Service: Endoscopy;  Laterality: N/A;  11:15  . REFRACTIVE SURGERY  2011   Bilateral, two seperate occasions first in 2006  . REMOVAL OF IMPLANT    . RETINAL DETACHMENT SURGERY Bilateral 05/2005   Allergies:  Allergies  Allergen Reactions  . Nsaids     Kidney function   . Diclofenac Other (See Comments)    hallucination  . Oxycodone     Vomiting    Family history:  Both parents are diseased they were both diabetic.  Father also had chronic kidney disease and mother had CAD.  They lived to be 20.  She had 3 brothers and 1 sister and they are all disease.  All 3 brothers were diabetic.  2 of her brothers were not hemodialysis.  Sister had COPD and died at 56.  Social history  She is married.  She worked at [employer] for 40 years before her  disability.  She has 2 sons and they are both in good health.  One [employer].  She has never smoked cigarettes and drinks alcohol occasionally.   Physical examination  Blood pressure (!) 127/56, pulse 86, temperature 98.9 F (37.2 C), temperature source Oral, height _0  (1.651 m), weight (!) 319 lb (144.7 kg). Patient is alert and in no acute distress. She is wearing a mask. Conjunctiva is pink. Sclera is nonicteric Oropharyngeal mucosa is normal. She has upper and lower dentures in place. No neck masses or thyromegaly noted. Cardiac exam with regular rhythm normal S1 and S2. No murmur or gallop noted. Lungs are clear to auscultation. Abdomen is full but soft and nontender without organomegaly or masses. She has both pitting and nonpitting pretibial edema.  It appears predominately to be nonpitting.  She does not have clubbing or koilonychia.  Labs/studies Results:  Lab data from 03/29/2020. WBC 9.2 H&H 11.7 and 37.3 Platelet count 289K  Serum iron 39, TIBC 298 and saturation 13% Serum sodium 138, potassium 4.3, BUN 22 and creatinine 1.45 Serum calcium 9.1  Lab data from 08/18/2020 WBC 8.3 H&H 12.3 and 37.6 MCV 89.3 Platelet count 286K  Serum iron 77 TIBC 345 and saturation 14%. Vitamin D level 54.  Assessment:  #1.  Patient is 59 year old Afro-American female who was discovered to have iron deficiency anemia and her symptoms and hemoglobin has improved with oral iron.  She has a history of GERD and has been on PPI in the past but not in the last 6 months.  No history of melena or rectal bleeding.  She is not taking any NSAIDs. She had colonoscopy in May 2016 and EGD in March 2017. It remains to be seen if she has iron deficiency anemia due to GI blood loss or impaired iron absorption. Would recommend diagnostic esophagogastroduodenoscopy and colonoscopy timing of which will depend on whether or not she has blood in her stool.  If stool is negative she could  wait until after the holidays.  #2.  History of GERD.  Symptoms are well controlled with dietary modifications.  Recommendations  Patient advised to stop ferrous sulfate until Hemoccults  can be done. Hemoccult x3. If she has evidence of occult GI bleed with proceed with esophagogastroduodenoscopy and colonoscopy now otherwise will wait until after the holidays. Further recommendations to follow.

## 2020-09-14 NOTE — Patient Instructions (Signed)
Stop ferrous sulfate until stool test completed Hemoccult x3. Esophagogastroduodenoscopy and colonoscopy to be scheduled in January 2022 unless stool test is positive for blood.

## 2020-09-20 ENCOUNTER — Ambulatory Visit: Payer: Medicare Other | Admitting: Family Medicine

## 2020-09-20 ENCOUNTER — Other Ambulatory Visit: Payer: Self-pay

## 2020-09-20 ENCOUNTER — Encounter: Payer: Self-pay | Admitting: Internal Medicine

## 2020-09-20 ENCOUNTER — Telehealth: Payer: Self-pay | Admitting: Family Medicine

## 2020-09-20 ENCOUNTER — Ambulatory Visit (HOSPITAL_COMMUNITY): Payer: Medicare Other | Attending: Physician Assistant | Admitting: Physical Therapy

## 2020-09-20 ENCOUNTER — Ambulatory Visit (INDEPENDENT_AMBULATORY_CARE_PROVIDER_SITE_OTHER): Payer: Medicare Other | Admitting: Internal Medicine

## 2020-09-20 VITALS — BP 158/74 | HR 84 | Temp 98.7°F | Resp 18 | Ht 65.0 in | Wt 321.0 lb

## 2020-09-20 DIAGNOSIS — G4733 Obstructive sleep apnea (adult) (pediatric): Secondary | ICD-10-CM | POA: Diagnosis not present

## 2020-09-20 DIAGNOSIS — Z794 Long term (current) use of insulin: Secondary | ICD-10-CM

## 2020-09-20 DIAGNOSIS — E876 Hypokalemia: Secondary | ICD-10-CM

## 2020-09-20 DIAGNOSIS — I110 Hypertensive heart disease with heart failure: Secondary | ICD-10-CM

## 2020-09-20 DIAGNOSIS — R600 Localized edema: Secondary | ICD-10-CM | POA: Diagnosis not present

## 2020-09-20 DIAGNOSIS — E1121 Type 2 diabetes mellitus with diabetic nephropathy: Secondary | ICD-10-CM

## 2020-09-20 DIAGNOSIS — I10 Essential (primary) hypertension: Secondary | ICD-10-CM

## 2020-09-20 DIAGNOSIS — I890 Lymphedema, not elsewhere classified: Secondary | ICD-10-CM | POA: Insufficient documentation

## 2020-09-20 DIAGNOSIS — N1832 Chronic kidney disease, stage 3b: Secondary | ICD-10-CM

## 2020-09-20 MED ORDER — POTASSIUM CHLORIDE CRYS ER 10 MEQ PO TBCR: 10.0000 meq | EXTENDED_RELEASE_TABLET | Freq: Three times a day (TID) | ORAL | 1 refills | Status: DC

## 2020-09-20 NOTE — Patient Instructions (Signed)
Please continue to take blood pressure medications as prescribed.  Please continue to use CPAP machine regularly.  You are being referred to pulmonology for sleep apnea.  You are being advised to take flu vaccine as soon as possible.  Please check blood pressure at home and bring the log in the next visit.  Please follow DASH diet and perform moderate exercise/walking at least 150 minutes per week.  DASH stands for Dietary Approaches to Stop Hypertension. The DASH diet is a healthy-eating plan designed to help treat or prevent high blood pressure (hypertension).  The DASH diet includes foods that are rich in potassium, calcium and magnesium. These nutrients help control blood pressure. The diet limits foods that are high in sodium, saturated fat and added sugars.  Studies have shown that the DASH diet can lower blood pressure in as little as two weeks. The diet can also lower low-density lipoprotein (LDL or "bad") cholesterol levels in the blood. High blood pressure and high LDL cholesterol levels are two major risk factors for heart disease and stroke.    DASH diet: Recommended servings The DASH diet provides daily and weekly nutritional goals. The number of servings you should have depends on your daily calorie needs.  Here's a look at the recommended servings from each food group for a 2,000-calorie-a-day DASH diet:  Grains: 6 to 8 servings a day. One serving is one slice bread, 1 ounce dry cereal, or 1/2 cup cooked cereal, rice or pasta. Vegetables: 4 to 5 servings a day. One serving is 1 cup raw leafy green vegetable, 1/2 cup cut-up raw or cooked vegetables, or 1/2 cup vegetable juice. Fruits: 4 to 5 servings a day. One serving is one medium fruit, 1/2 cup fresh, frozen or canned fruit, or 1/2 cup fruit juice. Fat-free or low-fat dairy products: 2 to 3 servings a day. One serving is 1 cup milk or yogurt, or 1 1/2 ounces cheese. Lean meats, poultry and fish: six 1-ounce servings or  fewer a day. One serving is 1 ounce cooked meat, poultry or fish, or 1 egg. Nuts, seeds and legumes: 4 to 5 servings a week. One serving is 1/3 cup nuts, 2 tablespoons peanut butter, 2 tablespoons seeds, or 1/2 cup cooked legumes (dried beans or peas). Fats and oils: 2 to 3 servings a day. One serving is 1 teaspoon soft margarine, 1 teaspoon vegetable oil, 1 tablespoon mayonnaise or 2 tablespoons salad dressing. Sweets and added sugars: 5 servings or fewer a week. One serving is 1 tablespoon sugar, jelly or jam, 1/2 cup sorbet, or 1 cup lemonade.

## 2020-09-20 NOTE — Telephone Encounter (Signed)
fmla forms received  Copied Noted Sleeved  
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
## 2020-09-21 DIAGNOSIS — M170 Bilateral primary osteoarthritis of knee: Secondary | ICD-10-CM | POA: Diagnosis not present

## 2020-09-21 NOTE — Assessment & Plan Note (Signed)
BP Readings from Last 1 Encounters:  09/20/20 (!) 158/74   Uncontrolled, in the setting of pain and noncompliance to CPAP use for OSA Counseled for compliance with the medications Advised to check BP at home and bring the log in the next visit Advised DASH diet and moderate exercise/walking, at least 150 mins/week

## 2020-09-21 NOTE — Assessment & Plan Note (Signed)
Advised to comply with CPAP use Referred to Pulmonology as per patient request

## 2020-09-21 NOTE — Progress Notes (Addendum)
Established Patient Office Visit  Subjective:  [name], female    DOB: 1961-01-02  Age: 59 y.o. MRN: [id number]  CC:  Chief Complaint  Patient presents with  . Follow-up    pt started taking vitamin c and d just wanted to make sure this is ok also is going to [facility] for knees as they are bothering her     HPI Kendra Hanson is a 59 year old female with a medical history of hypertension, type II DM, CKD stage III, OSA on CPAP, GERD and morbid obesity who presents for follow-up of her chronic medical conditions.  She has been doing well overall.  She has chronic lower extremity edema, for which she has been undergoing physical therapy.  She has been told to wear compression stockings or wrap to help with the lower extremity edema.  Patient was advised to keep legs elevated especially at nighttime.  Her blood pressure was 158/74 in the office today.  She is compliant with her medications.  She admits that she has not been using the CPAP regularly.  She denies any headache, dizziness, chest pain, dyspnea or palpitations.  She follows up with Dr Dorris Fetch for type 2 DM management.  She recently had booster dose of Covid vaccine in the last week.  She wants to wait for the flu vaccine for now.  Past Medical History:  Diagnosis Date  . Anemia   . Fibroids    Uterine  . GERD (gastroesophageal reflux disease)   . Hyperlipidemia 2008   Lipid profile in 04/2011:136, 53, 43  . Hypertension 2008   Normal CBC and CMet in 2012; negative stress nuclear in 2006- patient asymptomatic  . Insulin dependent diabetes mellitus 1996  . Multiple allergies    perennial  . Normocytic normochromic anemia 12/27/2015  . Obesity   . PONV (postoperative nausea and vomiting)   . Sleep apnea    CPAP    Past Surgical History:  Procedure Laterality Date  . CATARACT EXTRACTION W/PHACO Right   . COLONOSCOPY N/A 04/05/2015   Procedure: COLONOSCOPY;  Surgeon: Rogene Houston,  MD;  [facility];  Service: Endoscopy;  Laterality: N/A;  730  . DENTAL SURGERY    . ESOPHAGOGASTRODUODENOSCOPY N/A 01/21/2016   Procedure: ESOPHAGOGASTRODUODENOSCOPY (EGD);  Surgeon: Rogene Houston, MD;  [facility];  Service: Endoscopy;  Laterality: N/A;  11:15  . REFRACTIVE SURGERY  2011   Bilateral, two seperate occasions first in 2006  . REMOVAL OF IMPLANT    . RETINAL DETACHMENT SURGERY Bilateral 05/2005    Family History  Problem Relation Age of Onset  . Hypertension Mother   . Diabetes Mother   . Kidney failure Mother   . Stroke Mother   . Kidney failure Father   . Diabetes Father   . Heart attack Father   . COPD Sister   . Diabetes Brother   . Diabetes Brother   . Diabetes Brother   . Diabetes Brother   . Diabetes Brother   . Hypertension Brother     Social History   Socioeconomic History  . Marital status: Married    Spouse name: Not on file  . Number of children: Not on file  . Years of education: Not on file  . Highest education level: Not on file  Occupational History  . Occupation: Employed    Employer: [employer]  Tobacco Use  . Smoking status: Never Smoker  . Smokeless tobacco: Never  Used  Vaping Use  . Vaping Use: Never used  Substance and Sexual Activity  . Alcohol use: No  . Drug use: No  . Sexual activity: Yes    Birth control/protection: None, Implant  Other Topics Concern  . Not on file  Social History Narrative   Married with 2 children   Social Determinants of Health   Financial Resource Strain: Low Risk   . Difficulty of Paying Living Expenses: Not hard at all  Food Insecurity: No Food Insecurity  . Worried About Charity fundraiser in the Last Year: Never true  . Ran Out of Food in the Last Year: Never true  Transportation Needs: No Transportation Needs  . Lack of Transportation (Medical): No  . Lack of Transportation (Non-Medical): No  Physical Activity: Inactive  . Days of Exercise per Week: 0 days    . Minutes of Exercise per Session: 0 min  Stress: No Stress Concern Present  . Feeling of Stress : Not at all  Social Connections: Socially Integrated  . Frequency of Communication with Friends and Family: More than three times a week  . Frequency of Social Gatherings with Friends and Family: Never  . Attends Religious Services: More than 4 times per year  . Active Member of Clubs or Organizations: Yes  . Attends Archivist Meetings: More than 4 times per year  . Marital Status: Married  Human resources officer Violence: Not At Risk  . Fear of Current or Ex-Partner: No  . Emotionally Abused: No  . Physically Abused: No  . Sexually Abused: No    Outpatient Medications Prior to Visit  Medication Sig Dispense Refill  . acetaminophen (TYLENOL) 500 MG tablet Take 500 mg by mouth every 6 (six) hours as needed.    . Ascorbic Acid (VITAMIN C) 1000 MG tablet Take 500 mg by mouth daily.    Marland Kitchen azelastine (ASTELIN) 0.1 % nasal spray Place 2 sprays into both nostrils 2 (two) times daily. Use in each nostril as directed 30 mL 0  . benzonatate (TESSALON) 100 MG capsule Take 1 capsule (100 mg total) by mouth 2 (two) times daily as needed for cough. 30 capsule 1  . Blood Glucose Monitoring Suppl (ACCU-CHEK GUIDE ME) w/Device KIT 1 Piece by Does not apply route as directed. 1 kit 0  . cetirizine (ZYRTEC) 10 MG tablet Take 10 mg by mouth daily.     . cholecalciferol (VITAMIN D3) 25 MCG (1000 UNIT) tablet Take 1,000 Units by mouth daily.    . cyclobenzaprine (FLEXERIL) 10 MG tablet Take 1 tablet (10 mg total) by mouth 3 (three) times daily as needed for muscle spasms. 90 tablet 0  . diltiazem (CARDIZEM CD) 360 MG 24 hr capsule Take 1 capsule (360 mg total) by mouth daily. 30 capsule 5  . Docusate Calcium (STOOL SOFTENER PO) Take by mouth daily.    . ferrous sulfate 325 (65 FE) MG tablet Take 325 mg by mouth daily with breakfast.    . glipiZIDE (GLUCOTROL XL) 5 MG 24 hr tablet Take 1 tablet (5 mg total)  by mouth 2 (two) times daily with a meal. 180 tablet 1  . GLOBAL EASE INJECT PEN NEEDLES 31G X 8 MM MISC USE UP TO 5 TIMES A DAY AS DIRECTED. 150 each PRN  . glucose blood (ACCU-CHEK GUIDE) test strip USE TO TEST FOUR TIMES DAILY AS DIRECTED. 100 strip 0  . insulin isophane & regular human (HUMULIN 70/30 KWIKPEN) (70-30) 100 UNIT/ML KwikPen INJECT 50  UNITS INTO THE SKIN AT BREAKFAST AND 44 UNITS AT SUPPER 90 mL 0  . Lancets (FREESTYLE) lancets Use as instructed three times daily dx e11.65 100 each 12  . metolazone (ZAROXOLYN) 2.5 MG tablet Take 2.5 mg by mouth daily.     . mometasone (NASONEX) 50 MCG/ACT nasal spray Place 2 sprays into the nose daily. 17 g 12  . montelukast (SINGULAIR) 10 MG tablet Take 1 tablet (10 mg total) by mouth at bedtime. 30 tablet 3  . ondansetron (ZOFRAN) 4 MG tablet Take 1 tablet (4 mg total) by mouth 2 (two) times daily as needed for nausea or vomiting. 30 tablet 0  . pantoprazole (PROTONIX) 20 MG tablet TAKE 1 TABLET BY MOUTH  DAILY 90 tablet 1  . rosuvastatin (CRESTOR) 10 MG tablet Take 1 tablet (10 mg total) by mouth daily. 90 tablet 3  . Semaglutide, 1 MG/DOSE, (OZEMPIC, 1 MG/DOSE,) 2 MG/1.5ML SOPN Inject 0.5 mg into the skin once a week. 1.5 mL 2  . simethicone (MYLICON) 80 MG chewable tablet Chew 80 mg by mouth every 6 (six) hours as needed for flatulence.    . torsemide (DEMADEX) 20 MG tablet Take one tab by mouth (76m) every other day, alternating with two tabs (464m every other day (Patient taking differently: Take 20 mg by mouth daily. T) 135 tablet 1  . UNABLE TO FIND Diabetes shoes x 1 and inserts x 3 Dx:E11.29 1 each 0  . UNABLE TO FIND Compression hose (knee high) 15-2011m pair Dx bilateral leg swelling 1 each 0  . UNABLE TO FIND Diabetic shoes x 1 inserts x 3  Dx E11.9 1 each 0  . potassium chloride (KLOR-CON) 10 MEQ tablet Take 1 tablet (10 mEq total) by mouth 3 (three) times daily. 270 tablet 1   No facility-administered medications prior to  visit.    Allergies  Allergen Reactions  . Nsaids     Kidney function   . Diclofenac Other (See Comments)    hallucination  . Oxycodone     Vomiting     ROS Review of Systems  Constitutional: Negative for chills and fever.  HENT: Negative for congestion, sinus pressure, sinus pain and sore throat.   Eyes: Negative for pain and discharge.  Respiratory: Negative for cough and shortness of breath.   Cardiovascular: Negative for chest pain and palpitations.  Gastrointestinal: Negative for abdominal pain, constipation, diarrhea, nausea and vomiting.  Endocrine: Negative for polydipsia and polyuria.  Genitourinary: Negative for dysuria and hematuria.  Musculoskeletal: Negative for neck pain and neck stiffness.  Skin: Negative for rash.  Neurological: Negative for dizziness and weakness.  Psychiatric/Behavioral: Negative for agitation and behavioral problems.      Objective:    Physical Exam Vitals reviewed.  Constitutional:      General: She is not in acute distress.    Appearance: She is obese. She is not diaphoretic.  HENT:     Head: Normocephalic and atraumatic.     Nose: Nose normal. No congestion.     Mouth/Throat:     Mouth: Mucous membranes are moist.     Pharynx: No posterior oropharyngeal erythema.  Eyes:     General: No scleral icterus.    Extraocular Movements: Extraocular movements intact.     Pupils: Pupils are equal, round, and reactive to light.  Cardiovascular:     Rate and Rhythm: Normal rate and regular rhythm.     Heart sounds: Normal heart sounds. No murmur heard.   Pulmonary:  Breath sounds: Normal breath sounds. No wheezing or rales.  Abdominal:     Palpations: Abdomen is soft.     Tenderness: There is no abdominal tenderness.  Musculoskeletal:     Cervical back: Neck supple. No tenderness.     Right lower leg: Edema (2+) present.     Left lower leg: Edema (2+) present.  Skin:    General: Skin is warm.     Findings: No rash.    Neurological:     General: No focal deficit present.     Mental Status: She is alert and oriented to person, place, and time.     Sensory: No sensory deficit.     Motor: No weakness.  Psychiatric:        Mood and Affect: Mood normal.        Behavior: Behavior normal.     BP (!) 158/74 (BP Location: Right Arm, Patient Position: Sitting, Cuff Size: Normal)   Pulse 84   Temp 98.7 F (37.1 C) (Oral)   Resp 18   Ht 5' 5"  (1.651 m)   Wt (!) 321 lb (145.6 kg)   SpO2 94%   BMI 53.42 kg/m  Wt Readings from Last 3 Encounters:  09/20/20 (!) 321 lb (145.6 kg)  09/14/20 (!) 319 lb (144.7 kg)  07/22/20 (!) 325 lb 6.4 oz (147.6 kg)     Health Maintenance Due  Topic Date Due  . INFLUENZA VACCINE  06/13/2020    There are no preventive care reminders to display for this patient.  Lab Results  Component Value Date   TSH 2.22 02/03/2020   Lab Results  Component Value Date   WBC 9.3 10/26/2018   HGB 11.9 10/26/2018   HCT 35.6 10/26/2018   MCV 86 10/26/2018   PLT 324 10/26/2018   Lab Results  Component Value Date   NA 140 02/03/2020   K 4.1 02/03/2020   CO2 32 02/03/2020   GLUCOSE 141 (H) 02/03/2020   BUN 36 (H) 02/03/2020   CREATININE 1.56 (H) 02/03/2020   BILITOT 0.6 02/03/2020   ALKPHOS 114 06/04/2019   AST 14 02/03/2020   ALT 12 02/03/2020   PROT 7.3 02/03/2020   ALBUMIN 4.1 06/04/2019   CALCIUM 9.3 02/03/2020   ANIONGAP 8 03/10/2016   Lab Results  Component Value Date   CHOL 202 (H) 09/02/2019   Lab Results  Component Value Date   HDL 49 (L) 09/02/2019   Lab Results  Component Value Date   LDLCALC 131 (H) 09/02/2019   Lab Results  Component Value Date   TRIG 108 09/02/2019   Lab Results  Component Value Date   CHOLHDL 4.1 09/02/2019   Lab Results  Component Value Date   HGBA1C 7.6 (A) 07/22/2020      Assessment & Plan:   Problem List Items Addressed This Visit      Cardiovascular and Mediastinum   Hypertensive disorder - Primary    BP  Readings from Last 1 Encounters:  09/20/20 (!) 158/74   Uncontrolled, in the setting of pain and noncompliance to CPAP use for OSA Counseled for compliance with the medications Advised to check BP at home and bring the log in the next visit Advised DASH diet and moderate exercise/walking, at least 150 mins/week         Respiratory   Sleep apnea    Advised to comply with CPAP use Referred to Pulmonology as per patient request      Relevant Orders   Ambulatory referral  to Pulmonology     Endocrine   Type 2 diabetes mellitus (HCC)    HbA1C: 7.6 (07/2020) On Humulin, Ozempic and Glipizide, follows up with Encocrinologist Advised to follow diabetic diet Diabetic eye exam: Advised to follow up with Ophthalmology for diabetic eye exam         Genitourinary   Stage 3b chronic kidney disease ([place])    Follows up with Nephrologist Avoid nephrotoxic agents On Vit D supplements        Other   Lower extremity edema    Likely lymphedema and/or related to venous insufficiency Advised to keep legs elevated Compression stockings Continue PT        Other Visit Diagnoses    Hypokalemia       Relevant Medications   potassium chloride (KLOR-CON) 10 MEQ tablet      Meds ordered this encounter  Medications  . potassium chloride (KLOR-CON) 10 MEQ tablet    Sig: Take 1 tablet (10 mEq total) by mouth 3 (three) times daily.    Dispense:  270 tablet    Refill:  1    Requesting 1 year supply    Follow-up: Return in about 3 months (around 12/21/2020).    Lindell Spar, MD

## 2020-09-21 NOTE — Assessment & Plan Note (Signed)
Likely lymphedema and/or related to venous insufficiency Advised to keep legs elevated Compression stockings Continue PT

## 2020-09-21 NOTE — Assessment & Plan Note (Signed)
Follows up with Nephrologist Avoid nephrotoxic agents On Vit D supplements

## 2020-09-21 NOTE — Assessment & Plan Note (Signed)
HbA1C: 7.6 (07/2020) On Humulin, Ozempic and Glipizide, follows up with Encocrinologist Advised to follow diabetic diet Diabetic eye exam: Advised to follow up with Ophthalmology for diabetic eye exam

## 2020-09-22 ENCOUNTER — Ambulatory Visit (HOSPITAL_COMMUNITY): Payer: Medicare Other | Admitting: Physical Therapy

## 2020-09-22 ENCOUNTER — Encounter (HOSPITAL_COMMUNITY): Payer: Self-pay | Admitting: Physical Therapy

## 2020-09-22 ENCOUNTER — Other Ambulatory Visit: Payer: Self-pay

## 2020-09-22 DIAGNOSIS — I890 Lymphedema, not elsewhere classified: Secondary | ICD-10-CM
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
## 2020-09-24 ENCOUNTER — Ambulatory Visit (HOSPITAL_COMMUNITY): Payer: Medicare Other

## 2020-09-24 ENCOUNTER — Other Ambulatory Visit: Payer: Self-pay

## 2020-09-24 ENCOUNTER — Encounter (HOSPITAL_COMMUNITY): Payer: Self-pay

## 2020-09-24 DIAGNOSIS — I890 Lymphedema, not elsewhere classified: Secondary | ICD-10-CM
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
## 2020-09-27 ENCOUNTER — Ambulatory Visit (HOSPITAL_COMMUNITY): Payer: Medicare Other | Admitting: Physical Therapy

## 2020-09-27 ENCOUNTER — Other Ambulatory Visit: Payer: Self-pay

## 2020-09-27 DIAGNOSIS — I890 Lymphedema, not elsewhere classified: Secondary | ICD-10-CM | POA: Diagnosis not present
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
## 2020-09-28 ENCOUNTER — Ambulatory Visit (INDEPENDENT_AMBULATORY_CARE_PROVIDER_SITE_OTHER): Payer: Medicare Other | Admitting: Family Medicine

## 2020-09-28 ENCOUNTER — Other Ambulatory Visit: Payer: Self-pay

## 2020-09-28 ENCOUNTER — Encounter: Payer: Self-pay | Admitting: Family Medicine

## 2020-09-28 VITALS — BP 143/78 | HR 88 | Resp 16 | Ht 65.0 in | Wt 314.0 lb

## 2020-09-28 DIAGNOSIS — M542 Cervicalgia: Secondary | ICD-10-CM

## 2020-09-28 DIAGNOSIS — E1122 Type 2 diabetes mellitus with diabetic chronic kidney disease: Secondary | ICD-10-CM

## 2020-09-28 DIAGNOSIS — G5622 Lesion of ulnar nerve, left upper limb: Secondary | ICD-10-CM

## 2020-09-28 DIAGNOSIS — M170 Bilateral primary osteoarthritis of knee: Secondary | ICD-10-CM

## 2020-09-28 DIAGNOSIS — N1832 Chronic kidney disease, stage 3b: Secondary | ICD-10-CM

## 2020-09-28 DIAGNOSIS — I890 Lymphedema, not elsewhere classified: Secondary | ICD-10-CM | POA: Diagnosis not present

## 2020-09-28 DIAGNOSIS — Z0289 Encounter for other administrative examinations: Secondary | ICD-10-CM | POA: Diagnosis not present

## 2020-09-28 DIAGNOSIS — N183 Chronic kidney disease, stage 3 unspecified: Secondary | ICD-10-CM

## 2020-09-28 DIAGNOSIS — IMO0002 Reserved for concepts with insufficient information to code with codable children: Secondary | ICD-10-CM

## 2020-09-28 DIAGNOSIS — Z794 Long term (current) use of insulin: Secondary | ICD-10-CM

## 2020-09-28 MED ORDER — GLUCOSE BLOOD VI STRP: ORAL_STRIP | 3 refills | Status: DC

## 2020-09-28 NOTE — Patient Instructions (Addendum)
F/u in office with MD in February , call if you need me sooner  Please provide appointment information on next appointments for Dr Caron Presume and Orthopedics  Please have Dr Marylene Buerger determine drug safety re knee injections, he is  The expert  Blood pressure slightly high, need to continue weight loss  We will send for nerve conduction test and X ray of  Knees from Dr Murphy/ [place] office  In [place]  Thanks for choosing [facility], we consider it a privelige to serve you.

## 2020-09-28 NOTE — Assessment & Plan Note (Addendum)
treated at [facility] physical therapy, will be fitted for compression hoose and pump, re fer to vascular to manage

## 2020-09-28 NOTE — Progress Notes (Signed)
Kendra Hanson     MRN: [id number]      DOB: December 26, 1960   HPI Ms. Sliwa is here for  Form completion of [employer] disability , she has been on disability in 2019, was unable to work in 2018, primarily due carpal tunnel in both hands and decompression surgery on left elbow, unsuccessful result, still has left 4th and 5th finger tingling an d numbness, reports weak grip in left hand, wth difficulty doing simple tasks in her home as a result Also c/o daily pain from neck to both hands rated at a 5 to 6 with little activity Also dx in 2018 with stage 3 CKD, uncontrolled, and is followed bu Nephrology. and bilateral severe osteoarthritis in knees, and chronic uncontrolled pain generally a 5, pain and stiffness limiting ability to stand or walk for over 1 hour without pain.She  had injections in both knees last week and is getting set up for a 3 part series of injections. New diagnosis of lymphedema and is hoping to get pumps, will need re evaluation and oversight for this by vascular surgeon Improved blood sugar control though a major challenge, followed by Endo ROS Denies recent fever or chills. Denies sinus pressure, nasal congestion, ear pain or sore throat. Denies chest congestion, productive cough or wheezing. Denies chest pains, palpitations and leg swelling Denies abdominal pain, nausea, vomiting,diarrhea or constipation.   Denies dysuria, frequency, hesitancy or incontinence. . Denies depression, anxiety or insomnia. Denies skin break down or rash.   PE  BP (!) 143/78   Pulse 88   Resp 16   Ht 5\' 5"  (1.651 m)   Wt (!) 314 lb 0.6 oz (142.4 kg)   SpO2 97%   BMI 52.26 kg/m   Patient alert and oriented and in no cardiopulmonary distress.  HEENT: No facial asymmetry, EOMI,     Neck decreased ROM .  Chest: Clear to auscultation bilaterally.  CVS: S1, S2 no murmurs, no S3.Regular rate.  ABD: Soft non tender.   Ext:  Edema present , patient wearing  compression hose   MS:  decreased ROM spine,  and knees.Marked deformity and crepitus of both knees  Skin: Intact, no ulcerations or rash noted.  Psych: Good eye contact, normal affect. Memory intact not anxious or depressed appearing.  CNS: CN 2-12 intact, grade 4 power in left hand,otherwise   normal throughout.  Assessment & Plan  Lymphedema treated at [facility] physical therapy, will be fitted for compression hoose and pump, re fer to vascular to manage  Osteoarthritis of both knees Managed by ortho,  Marked deformity , crepitus and reduced ROM  Ulnar neuropathy Chronic disabling weakness , and tingling, precluding patient from being able to work Will send for most recent [facility] conduction tests  Stage 3b chronic kidney disease ([place]) Followed by Nephrology   Type 2 diabetes mellitus ([place]) Controlled, no change in medication Managed by Endo Ms. Nuon is reminded of the importance of commitment to daily physical activity for 30 minutes or more, as able and the need to limit carbohydrate intake to 30 to 60 grams per meal to help with blood sugar control.   The need to take medication as prescribed, test blood sugar as directed, and to call between visits if there is a concern that blood sugar is uncontrolled is also discussed.   Ms. Collignon is reminded of the importance of daily foot exam, annual eye examination, and good blood sugar, blood pressure and cholesterol control.  Diabetic Labs Latest  Ref Rng & Units 07/22/2020 03/19/2020 02/03/2020 12/16/2019 09/02/2019  HbA1c 4.0 - 5.6 % 7.6(A) 7.8(A) - 7.5(A) 8.8(H)  Microalbumin mg/dL - - 1.9 - -  Micro/Creat Ratio <30 mcg/mg creat - - 15 - -  Chol <200 mg/dL - - - - 202(H)  HDL > OR = 50 mg/dL - - - - 49(L)  Calc LDL mg/dL (calc) - - - - 131(H)  Triglycerides <150 mg/dL - - - - 108  Creatinine 0.50 - 1.05 mg/dL - - 1.56(H) - 1.48(H)   BP/Weight 09/28/2020 09/20/2020 09/14/2020 07/22/2020 05/05/2020 04/29/2020 8/83/3744  Systolic BP 514 604 799 872 138 158  727  Diastolic BP 78 74 56 72 72 56 79  Wt. (Lbs) 314.04 321 319 325.4 325.04 334 324  BMI 52.26 53.42 53.08 54.15 54.09 55.58 55.61   Foot/eye exam completion dates Latest Ref Rng & Units 08/11/2019 06/25/2018  Eye Exam No Retinopathy Retinopathy(A) -  Foot Form Completion - - Done         Neck pain, bilateral Disabling neck pain radiaiting down both upper extremities with nerve impingement and disc disease.  Encounter for completion of form with patient Exam as documented

## 2020-09-29 ENCOUNTER — Encounter: Payer: Self-pay | Admitting: Family Medicine

## 2020-09-29 ENCOUNTER — Other Ambulatory Visit: Payer: Self-pay

## 2020-09-29 ENCOUNTER — Ambulatory Visit (HOSPITAL_COMMUNITY): Payer: Medicare Other | Admitting: Physical Therapy

## 2020-09-29 DIAGNOSIS — I890 Lymphedema, not elsewhere classified: Secondary | ICD-10-CM | POA: Diagnosis not present

## 2020-09-29 DIAGNOSIS — R29898 Other symptoms and signs involving the musculoskeletal system: Secondary | ICD-10-CM | POA: Insufficient documentation
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
## 2020-09-30 ENCOUNTER — Ambulatory Visit (INDEPENDENT_AMBULATORY_CARE_PROVIDER_SITE_OTHER): Payer: Medicare Other

## 2020-09-30 ENCOUNTER — Encounter: Payer: Self-pay | Admitting: Family Medicine

## 2020-09-30 DIAGNOSIS — Z23 Encounter for immunization: Secondary | ICD-10-CM | POA: Diagnosis not present

## 2020-09-30 DIAGNOSIS — M542 Cervicalgia: Secondary | ICD-10-CM | POA: Insufficient documentation

## 2020-09-30 DIAGNOSIS — Z0289 Encounter for other administrative examinations: Secondary | ICD-10-CM | POA: Insufficient documentation

## 2020-09-30 NOTE — Assessment & Plan Note (Signed)
Chronic disabling weakness , and tingling, precluding patient from being able to work Will send for most recent [employer] conduction tests

## 2020-09-30 NOTE — Assessment & Plan Note (Signed)
Exam as documented 

## 2020-09-30 NOTE — Assessment & Plan Note (Signed)
Controlled, no change in medication Managed by Endo Ms. Boehler is reminded of the importance of commitment to daily physical activity for 30 minutes or more, as able and the need to limit carbohydrate intake to 30 to 60 grams per meal to help with blood sugar control.   The need to take medication as prescribed, test blood sugar as directed, and to call between visits if there is a concern that blood sugar is uncontrolled is also discussed.   Ms. Sarnowski is reminded of the importance of daily foot exam, annual eye examination, and good blood sugar, blood pressure and cholesterol control.  Diabetic Labs Latest Ref Rng & Units 07/22/2020 03/19/2020 02/03/2020 12/16/2019 09/02/2019  HbA1c 4.0 - 5.6 % 7.6(A) 7.8(A) - 7.5(A) 8.8(H)  Microalbumin mg/dL - - 1.9 - -  Micro/Creat Ratio <30 mcg/mg creat - - 15 - -  Chol <200 mg/dL - - - - 202(H)  HDL > OR = 50 mg/dL - - - - 49(L)  Calc LDL mg/dL (calc) - - - - 131(H)  Triglycerides <150 mg/dL - - - - 108  Creatinine 0.50 - 1.05 mg/dL - - 1.56(H) - 1.48(H)   BP/Weight 09/28/2020 09/20/2020 09/14/2020 07/22/2020 05/05/2020 04/29/2020 0/22/3361  Systolic BP 224 497 530 051 102 111 735  Diastolic BP 78 74 56 72 72 56 79  Wt. (Lbs) 314.04 321 319 325.4 325.04 334 324  BMI 52.26 53.42 53.08 54.15 54.09 55.58 55.61   Foot/eye exam completion dates Latest Ref Rng & Units 08/11/2019 06/25/2018  Eye Exam No Retinopathy Retinopathy(A) -  Foot Form Completion - - Done

## 2020-09-30 NOTE — Assessment & Plan Note (Signed)
Managed by ortho,  Marked deformity , crepitus and reduced ROM

## 2020-09-30 NOTE — Assessment & Plan Note (Signed)
Followed by Nephrology.

## 2020-09-30 NOTE — Assessment & Plan Note (Signed)
Disabling neck pain radiaiting down both upper extremities with nerve impingement and disc disease.

## 2020-10-01 ENCOUNTER — Ambulatory Visit (HOSPITAL_COMMUNITY): Payer: Medicare Other

## 2020-10-04 ENCOUNTER — Encounter: Payer: Self-pay | Admitting: Podiatry

## 2020-10-04 ENCOUNTER — Encounter (HOSPITAL_COMMUNITY): Payer: Self-pay | Admitting: Physical Therapy

## 2020-10-04 ENCOUNTER — Ambulatory Visit: Payer: Medicare Other | Admitting: Podiatry

## 2020-10-04 ENCOUNTER — Ambulatory Visit (HOSPITAL_COMMUNITY): Payer: Medicare Other | Admitting: Physical Therapy

## 2020-10-04 ENCOUNTER — Other Ambulatory Visit: Payer: Self-pay

## 2020-10-04 ENCOUNTER — Telehealth (INDEPENDENT_AMBULATORY_CARE_PROVIDER_SITE_OTHER): Payer: Self-pay | Admitting: *Deleted

## 2020-10-04 DIAGNOSIS — E1149 Type 2 diabetes mellitus with other diabetic neurological complication: Secondary | ICD-10-CM

## 2020-10-04 DIAGNOSIS — L84 Corns and callosities: Secondary | ICD-10-CM | POA: Diagnosis not present

## 2020-10-04 DIAGNOSIS — I890 Lymphedema, not elsewhere classified: Secondary | ICD-10-CM | POA: Diagnosis not present

## 2020-10-04 DIAGNOSIS — E1140 Type 2 diabetes mellitus with diabetic neuropathy, unspecified: Secondary | ICD-10-CM

## 2020-10-04 NOTE — Telephone Encounter (Signed)
   Diagnosis:    Result(s)   Card 1: Negative:     Card 2:Negative:   Card 3:Negative:    Completed by: Thomas Hoff, LPN   HEMOCCULT SENSA DEVELOPER: LOT#: [id number]  EXPIRATION DATE: 2021-11   HEMOCCULT SENSA CARD:  LOT#:  [id number] 1R EXPIRATION DATE: 07/22   CARD CONTROL RESULTS:  POSITIVE: Positive NEGATIVE: Negative    ADDITIONAL COMMENTS: Patient was called and given results. Dr.Rehman will be made aware.
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
## 2020-10-05 NOTE — Progress Notes (Signed)
Subjective:   [name], female   DOB: 59 y.o.   MRN: [id number]   HPI Patient presents with caregiver with concern about lesions on the bottom of both feet and long-term diabetes.  States that it is not currently tender but she is always concerned about her feet because she does have neuropathy.  Patient does not smoke has had diabetes for a number years under reasonably good control with moderate obesity and is not active   Review of Systems  All other systems reviewed and are negative.       Objective:  Physical Exam Vitals and nursing note reviewed.  Constitutional:      Appearance: She is well-developed.  Pulmonary:     Effort: Pulmonary effort is normal.  Musculoskeletal:        General: Normal range of motion.  Skin:    General: Skin is warm.  Neurological:     Mental Status: She is alert.     Neurovascular status was found to be intact muscle strength was found to be adequate range of motion adequate.  Patient is noted to have keratotic lesion plantar aspect arch bilateral measuring about 1 cm x 1 cm thin with no indications of underlying involvement.  She has mild diminishment of sharp dull vibratory bilateral     Assessment:  Lesion formation which may be due to friction but does not appear to be related to ulceration or neuropathic diabetes     Plan:  H&P condition reviewed discussed.  At this point I have recommended trimming which was accomplished today and daily inspections of her feet along with diabetic foot exams to be done which was educated on today.  If any issues were to occur they are to let us know immediately

## 2020-10-05 NOTE — Telephone Encounter (Signed)
All 3 Hemoccults are negative 

## 2020-10-06 ENCOUNTER — Other Ambulatory Visit: Payer: Self-pay

## 2020-10-06 ENCOUNTER — Ambulatory Visit (HOSPITAL_COMMUNITY): Payer: Medicare Other | Admitting: Physical Therapy

## 2020-10-06 DIAGNOSIS — I890 Lymphedema, not elsewhere classified: Secondary | ICD-10-CM | POA: Diagnosis not present
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
## 2020-10-11 ENCOUNTER — Ambulatory Visit (HOSPITAL_COMMUNITY): Payer: Medicare Other | Admitting: Physical Therapy

## 2020-10-11 ENCOUNTER — Other Ambulatory Visit: Payer: Self-pay

## 2020-10-11 ENCOUNTER — Other Ambulatory Visit: Payer: Self-pay | Admitting: Family Medicine

## 2020-10-11 DIAGNOSIS — I890 Lymphedema, not elsewhere classified: Secondary | ICD-10-CM | POA: Diagnosis not present
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
## 2020-10-12 ENCOUNTER — Encounter: Payer: Self-pay | Admitting: Family Medicine

## 2020-10-12 ENCOUNTER — Ambulatory Visit (INDEPENDENT_AMBULATORY_CARE_PROVIDER_SITE_OTHER): Payer: Medicare Other | Admitting: Family Medicine

## 2020-10-12 VITALS — BP 142/80 | HR 68 | Temp 97.0°F | Ht 65.0 in | Wt 310.0 lb

## 2020-10-12 DIAGNOSIS — M170 Bilateral primary osteoarthritis of knee: Secondary | ICD-10-CM | POA: Diagnosis not present

## 2020-10-12 DIAGNOSIS — Z0000 Encounter for general adult medical examination without abnormal findings: Secondary | ICD-10-CM | POA: Diagnosis not present

## 2020-10-12 NOTE — Patient Instructions (Addendum)
Kendra Hanson , Thank you for taking time to come for your Medicare Wellness Visit. I appreciate your ongoing commitment to your health goals. Please review the following plan we discussed and let me know if I can assist you in the future.   Screening recommendations/referrals: Colonoscopy: 04/04/25 Mammogram: 12/28/21 Bone Density: Age 59 Recommended yearly ophthalmology/optometry visit for glaucoma screening and checkup Recommended yearly dental visit for hygiene and checkup  Vaccinations: Influenza vaccine: Fall 2022 Pneumococcal vaccine: Complete Tdap vaccine: 10/09/23 Shingles vaccine: Declined at this time  Advanced directives: No  Conditions/risks identified: None  Next appointment: 12/28/20 @ 2:40 pm  Preventive Care 40-64 Years, Female Preventive care refers to lifestyle choices and visits with your health care provider that can promote health and wellness. What does preventive care include?  A yearly physical exam. This is also called an annual well check.  Dental exams once or twice a year.  Routine eye exams. Ask your health care provider how often you should have your eyes checked.  Personal lifestyle choices, including:  Daily care of your teeth and gums.  Regular physical activity.  Eating a healthy diet.  Avoiding tobacco and drug use.  Limiting alcohol use.  Practicing safe sex.  Taking low-dose aspirin daily starting at age 33.  Taking vitamin and mineral supplements as recommended by your health care provider. What happens during an annual well check? The services and screenings done by your health care provider during your annual well check will depend on your age, overall health, lifestyle risk factors, and family history of disease. Counseling  Your health care provider may ask you questions about your:  Alcohol use.  Tobacco use.  Drug use.  Emotional well-being.  Home and relationship well-being.  Sexual activity.  Eating  habits.  Work and work Statistician.  Method of birth control.  Menstrual cycle.  Pregnancy history. Screening  You may have the following tests or measurements:  Height, weight, and BMI.  Blood pressure.  Lipid and cholesterol levels. These may be checked every 5 years, or more frequently if you are over 56 years old.  Skin check.  Lung cancer screening. You may have this screening every year starting at age 70 if you have a 30-pack-year history of smoking and currently smoke or have quit within the past 15 years.  Fecal occult blood test (FOBT) of the stool. You may have this test every year starting at age 16.  Flexible sigmoidoscopy or colonoscopy. You may have a sigmoidoscopy every 5 years or a colonoscopy every 10 years starting at age 62.  Hepatitis C blood test.  Hepatitis B blood test.  Sexually transmitted disease (STD) testing.  Diabetes screening. This is done by checking your blood sugar (glucose) after you have not eaten for a while (fasting). You may have this done every 1-3 years.  Mammogram. This may be done every 1-2 years. Talk to your health care provider about when you should start having regular mammograms. This may depend on whether you have a family history of breast cancer.  BRCA-related cancer screening. This may be done if you have a family history of breast, ovarian, tubal, or peritoneal cancers.  Pelvic exam and Pap test. This may be done every 3 years starting at age 4. Starting at age 58, this may be done every 5 years if you have a Pap test in combination with an HPV test.  Bone density scan. This is done to screen for osteoporosis. You may have this scan if you  are at high risk for osteoporosis. Discuss your test results, treatment options, and if necessary, the need for more tests with your health care provider. Vaccines  Your health care provider may recommend certain vaccines, such as:  Influenza vaccine. This is recommended every  year.  Tetanus, diphtheria, and acellular pertussis (Tdap, Td) vaccine. You may need a Td booster every 10 years.  Zoster vaccine. You may need this after age 59.  Pneumococcal 13-valent conjugate (PCV13) vaccine. You may need this if you have certain conditions and were not previously vaccinated.  Pneumococcal polysaccharide (PPSV23) vaccine. You may need one or two doses if you smoke cigarettes or if you have certain conditions. Talk to your health care provider about which screenings and vaccines you need and how often you need them. This information is not intended to replace advice given to you by your health care provider. Make sure you discuss any questions you have with your health care provider. Document Released: 11/26/2015 Document Revised: 07/19/2016 Document Reviewed: 08/31/2015 Elsevier Interactive Patient Education  2017 [place] Prevention in the Home Falls can cause injuries. They can happen to people of all ages. There are many things you can do to make your home safe and to help prevent falls. What can I do on the outside of my home?  Regularly fix the edges of walkways and driveways and fix any cracks.  Remove anything that might make you trip as you walk through a door, such as a raised step or threshold.  Trim any bushes or trees on the path to your home.  Use bright outdoor lighting.  Clear any walking paths of anything that might make someone trip, such as rocks or tools.  Regularly check to see if handrails are loose or broken. Make sure that both sides of any steps have handrails.  Any raised decks and porches should have guardrails on the edges.  Have any leaves, snow, or ice cleared regularly.  Use sand or salt on walking paths during winter.  Clean up any spills in your garage right away. This includes oil or grease spills. What can I do in the bathroom?  Use night lights.  Install grab bars by the toilet and in the tub and shower.  Do not use towel bars as grab bars.  Use non-skid mats or decals in the tub or shower.  If you need to sit down in the shower, use a plastic, non-slip stool.  Keep the floor dry. Clean up any water that spills on the floor as soon as it happens.  Remove soap buildup in the tub or shower regularly.  Attach bath mats securely with double-sided non-slip rug tape.  Do not have throw rugs and other things on the floor that can make you trip. What can I do in the bedroom?  Use night lights.  Make sure that you have a light by your bed that is easy to reach.  Do not use any sheets or blankets that are too big for your bed. They should not hang down onto the floor.  Have a firm chair that has side arms. You can use this for support while you get dressed.  Do not have throw rugs and other things on the floor that can make you trip. What can I do in the kitchen?  Clean up any spills right away.  Avoid walking on wet floors.  Keep items that you use a lot in easy-to-reach places.  If you need  to reach something above you, use a strong step stool that has a grab bar.  Keep electrical cords out of the way.  Do not use floor polish or wax that makes floors slippery. If you must use wax, use non-skid floor wax.  Do not have throw rugs and other things on the floor that can make you trip. What can I do with my stairs?  Do not leave any items on the stairs.  Make sure that there are handrails on both sides of the stairs and use them. Fix handrails that are broken or loose. Make sure that handrails are as long as the stairways.  Check any carpeting to make sure that it is firmly attached to the stairs. Fix any carpet that is loose or worn.  Avoid having throw rugs at the top or bottom of the stairs. If you do have throw rugs, attach them to the floor with carpet tape.  Make sure that you have a light switch at the top of the stairs and the bottom of the stairs. If you do not have them,  ask someone to add them for you. What else can I do to help prevent falls?  Wear shoes that:  Do not have high heels.  Have rubber bottoms.  Are comfortable and fit you well.  Are closed at the toe. Do not wear sandals.  If you use a stepladder:  Make sure that it is fully opened. Do not climb a closed stepladder.  Make sure that both sides of the stepladder are locked into place.  Ask someone to hold it for you, if possible.  Clearly mark and make sure that you can see:  Any grab bars or handrails.  First and last steps.  Where the edge of each step is.  Use tools that help you move around (mobility aids) if they are needed. These include:  Canes.  Walkers.  Scooters.  Crutches.  Turn on the lights when you go into a dark area. Replace any light bulbs as soon as they burn out.  Set up your furniture so you have a clear path. Avoid moving your furniture around.  If any of your floors are uneven, fix them.  If there are any pets around you, be aware of where they are.  Review your medicines with your doctor. Some medicines can make you feel dizzy. This can increase your chance of falling. Ask your doctor what other things that you can do to help prevent falls. This information is not intended to replace advice given to you by your health care provider. Make sure you discuss any questions you have with your health care provider. Document Released: 08/26/2009 Document Revised: 04/06/2016 Document Reviewed: 12/04/2014 Elsevier Interactive Patient Education  2017 [employer].

## 2020-10-12 NOTE — Telephone Encounter (Signed)
I have [place] looking to see who the rep is so we can call and ask for them to bring more samples.  In the meantime, we can either send in a script or she can wait til we get some samples in.
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
## 2020-10-12 NOTE — Telephone Encounter (Signed)
Completed and faxed.

## 2020-10-13 ENCOUNTER — Ambulatory Visit (HOSPITAL_COMMUNITY): Payer: Medicare Other | Attending: Physician Assistant | Admitting: Physical Therapy

## 2020-10-13 ENCOUNTER — Other Ambulatory Visit: Payer: Self-pay

## 2020-10-13 DIAGNOSIS — N1832 Chronic kidney disease, stage 3b: Secondary | ICD-10-CM

## 2020-10-13 DIAGNOSIS — R29898 Other symptoms and signs involving the musculoskeletal system: Secondary | ICD-10-CM

## 2020-10-13 DIAGNOSIS — I890 Lymphedema, not elsewhere classified: Secondary | ICD-10-CM | POA: Insufficient documentation

## 2020-10-13 DIAGNOSIS — M170 Bilateral primary osteoarthritis of knee: Secondary | ICD-10-CM
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
## 2020-10-15 ENCOUNTER — Ambulatory Visit (HOSPITAL_COMMUNITY): Payer: Medicare Other

## 2020-10-18 ENCOUNTER — Ambulatory Visit (HOSPITAL_COMMUNITY): Payer: Medicare Other | Admitting: Physical Therapy

## 2020-10-19 DIAGNOSIS — M170 Bilateral primary osteoarthritis of knee: Secondary | ICD-10-CM | POA: Diagnosis not present

## 2020-10-20 ENCOUNTER — Ambulatory Visit (HOSPITAL_COMMUNITY): Payer: Medicare Other

## 2020-10-21 ENCOUNTER — Ambulatory Visit (HOSPITAL_COMMUNITY): Payer: Medicare Other | Admitting: Physical Therapy

## 2020-10-21 DIAGNOSIS — I890 Lymphedema, not elsewhere classified: Secondary | ICD-10-CM | POA: Diagnosis not present

## 2020-10-22 ENCOUNTER — Encounter (HOSPITAL_COMMUNITY): Payer: Medicare Other

## 2020-10-25 ENCOUNTER — Encounter (HOSPITAL_COMMUNITY): Payer: Medicare Other | Admitting: Physical Therapy

## 2020-10-26 ENCOUNTER — Encounter: Payer: Self-pay | Admitting: Family Medicine

## 2020-10-26 DIAGNOSIS — M170 Bilateral primary osteoarthritis of knee: Secondary | ICD-10-CM | POA: Diagnosis not present

## 2020-10-27 ENCOUNTER — Encounter (HOSPITAL_COMMUNITY): Payer: Medicare Other

## 2020-10-29 ENCOUNTER — Encounter (HOSPITAL_COMMUNITY): Payer: Medicare Other

## 2020-11-01 ENCOUNTER — Encounter (HOSPITAL_COMMUNITY): Payer: Medicare Other | Admitting: Physical Therapy

## 2020-11-02 ENCOUNTER — Telehealth: Payer: Self-pay | Admitting: Family Medicine

## 2020-11-02 NOTE — Telephone Encounter (Signed)
FMLA FORMS RECEIVED NOTED COPIED SLEEVED

## 2020-11-03 ENCOUNTER — Encounter (HOSPITAL_COMMUNITY): Payer: Medicare Other | Admitting: Physical Therapy

## 2020-11-04 ENCOUNTER — Encounter (HOSPITAL_COMMUNITY): Payer: Medicare Other | Admitting: Physical Therapy

## 2020-11-16 ENCOUNTER — Ambulatory Visit (INDEPENDENT_AMBULATORY_CARE_PROVIDER_SITE_OTHER): Payer: Medicare Other | Admitting: Ophthalmology

## 2020-11-16 ENCOUNTER — Encounter (INDEPENDENT_AMBULATORY_CARE_PROVIDER_SITE_OTHER): Payer: Self-pay | Admitting: Ophthalmology

## 2020-11-16 ENCOUNTER — Other Ambulatory Visit: Payer: Self-pay

## 2020-11-16 DIAGNOSIS — H2000 Unspecified acute and subacute iridocyclitis: Secondary | ICD-10-CM | POA: Diagnosis not present

## 2020-11-16 DIAGNOSIS — H35352 Cystoid macular degeneration, left eye: Secondary | ICD-10-CM | POA: Diagnosis not present

## 2020-11-16 DIAGNOSIS — E113552 Type 2 diabetes mellitus with stable proliferative diabetic retinopathy, left eye: Secondary | ICD-10-CM

## 2020-11-16 DIAGNOSIS — Z961 Presence of intraocular lens: Secondary | ICD-10-CM | POA: Diagnosis not present

## 2020-11-16 DIAGNOSIS — H35072 Retinal telangiectasis, left eye: Secondary | ICD-10-CM | POA: Diagnosis not present

## 2020-11-16 MED ORDER — PREDNISOLONE ACETATE 1 % OP SUSP: 1.0000 [drp] | Freq: Two times a day (BID) | OPHTHALMIC | 0 refills | Status: DC

## 2020-11-16 NOTE — Assessment & Plan Note (Signed)
Looks great post cataract surgery July 2021

## 2020-11-16 NOTE — Assessment & Plan Note (Signed)
Macular telangiectasis (MAC-TEL), or parafoveal telangiectasis is a condition of "unknown" cause.  Findings in or near the macula (center of vision) consist of microaneurysms (leaking small capillaries), often with leakage of fluid which in the active phase can impact fine discriminatory vision, and in some cases trigger profound scarring in the macula, with severe permanent vision loss.  Standard treatment is observation and periodic examinations to monitor for treatable complications.   The cause  of this condition is "unknown".  However, the practice of Dr. Zadie Rhine has discovered an association with sleep apnea with its nightly periods of low oxygen in the blood stream (hypoxia), retained carbon dioxide (hypercapnia), associated with transient nocturnal hypertensive episodes.   More recently, some patients also been found to have advanced lung disease, whether asthma or COPD, with similar findings.  Dr. Zadie Rhine has been evaluating the association of sleep apnea, nightly hypoxia, and Macular telangiectasis for over 18 years.  Most patients are found to be noncompliant with sleep apnea therapy or testing in the past.  Resumption of CPAP or similar therapy is strongly recommended if ordered in the past.  Upon review of risk factors or findings positive for sleep apnea, more formal, extensive sleep laboratory or home testing, may be recommended.  Numerous patients, proven to have MAC-TEL, have improved or resolved their ey condition promptly, within weeks, of the use of nighttime oxygen supplementation or continuous positive airway pressure (CPAP).  Minor perifoveal cystoid change temporal aspect of the fovea characteristic of macular telangiectasis.  Patient is 1 week Post level where CPAP was not used.
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
## 2020-11-16 NOTE — Assessment & Plan Note (Signed)
Mild anterior chamber cellular reaction, accompanied by ciliary flush in a perilimbal fashion

## 2020-11-16 NOTE — Assessment & Plan Note (Signed)

## 2020-11-16 NOTE — Assessment & Plan Note (Signed)
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

## 2020-11-17 DIAGNOSIS — N183 Chronic kidney disease, stage 3 unspecified: Secondary | ICD-10-CM | POA: Diagnosis not present

## 2020-11-17 DIAGNOSIS — Z79899 Other long term (current) drug therapy: Secondary | ICD-10-CM | POA: Diagnosis not present

## 2020-11-17 DIAGNOSIS — E1122 Type 2 diabetes mellitus with diabetic chronic kidney disease: Secondary | ICD-10-CM | POA: Diagnosis not present

## 2020-11-17 DIAGNOSIS — D631 Anemia in chronic kidney disease: Secondary | ICD-10-CM | POA: Diagnosis not present

## 2020-11-17 DIAGNOSIS — R809 Proteinuria, unspecified: Secondary | ICD-10-CM | POA: Diagnosis not present

## 2020-11-17 DIAGNOSIS — N1832 Chronic kidney disease, stage 3b: Secondary | ICD-10-CM | POA: Diagnosis not present

## 2020-11-17 DIAGNOSIS — E1165 Type 2 diabetes mellitus with hyperglycemia: Secondary | ICD-10-CM | POA: Diagnosis not present

## 2020-11-18 LAB — COMPREHENSIVE METABOLIC PANEL
ALT: 21 IU/L (ref 0–32)
AST: 19 IU/L (ref 0–40)
Albumin/Globulin Ratio: 1.5 (ref 1.2–2.2)
Albumin: 4 g/dL (ref 3.8–4.9)
Alkaline Phosphatase: 116 IU/L (ref 44–121)
BUN/Creatinine Ratio: 15 (ref 12–28)
BUN: 18 mg/dL (ref 8–27)
Bilirubin Total: 0.4 mg/dL (ref 0.0–1.2)
CO2: 27 mmol/L (ref 20–29)
Calcium: 9.2 mg/dL (ref 8.7–10.3)
Chloride: 103 mmol/L (ref 96–106)
Creatinine, Ser: 1.23 mg/dL — ABNORMAL HIGH (ref 0.57–1.00)
GFR calc Af Amer: 55 mL/min/{1.73_m2} — ABNORMAL LOW (ref >59)
GFR calc non Af Amer: 48 mL/min/{1.73_m2} — ABNORMAL LOW (ref >59)
Globulin, Total: 2.6 g/dL (ref 1.5–4.5)
Glucose: 127 mg/dL — ABNORMAL HIGH (ref 65–99)
Potassium: 4.6 mmol/L (ref 3.5–5.2)
Sodium: 144 mmol/L (ref 134–144)
Total Protein: 6.6 g/dL (ref 6.0–8.5)

## 2020-11-18 LAB — LIPID PANEL
Chol/HDL Ratio: 3.3 ratio (ref 0.0–4.4)
Cholesterol, Total: 199 mg/dL (ref 100–199)
HDL: 61 mg/dL (ref >39)
LDL Chol Calc (NIH): 120 mg/dL — ABNORMAL HIGH (ref 0–99)
Triglycerides: 101 mg/dL (ref 0–149)
VLDL Cholesterol Cal: 18 mg/dL (ref 5–40)

## 2020-11-18 LAB — HEMOGLOBIN A1C
Est. average glucose Bld gHb Est-mCnc: 206 mg/dL
Hgb A1c MFr Bld: 8.8 % — ABNORMAL HIGH (ref 4.8–5.6)

## 2020-11-18 LAB — T4, FREE: Free T4: 1.29 ng/dL (ref 0.82–1.77)

## 2020-11-18 LAB — TSH: TSH: 1.69 u[IU]/mL (ref 0.450–4.500)

## 2020-11-19 ENCOUNTER — Other Ambulatory Visit: Payer: Self-pay

## 2020-11-19 ENCOUNTER — Other Ambulatory Visit: Payer: Medicare Other

## 2020-11-19 DIAGNOSIS — Z20822 Contact with and (suspected) exposure to covid-19: Secondary | ICD-10-CM

## 2020-11-22 ENCOUNTER — Encounter (INDEPENDENT_AMBULATORY_CARE_PROVIDER_SITE_OTHER): Payer: Medicare Other | Admitting: Ophthalmology

## 2020-11-22 ENCOUNTER — Other Ambulatory Visit (HOSPITAL_COMMUNITY): Payer: Self-pay | Admitting: Family Medicine

## 2020-11-22 ENCOUNTER — Other Ambulatory Visit (INDEPENDENT_AMBULATORY_CARE_PROVIDER_SITE_OTHER): Payer: Self-pay

## 2020-11-22 DIAGNOSIS — Z1231 Encounter for screening mammogram for malignant neoplasm of breast: Secondary | ICD-10-CM

## 2020-11-23 ENCOUNTER — Encounter (INDEPENDENT_AMBULATORY_CARE_PROVIDER_SITE_OTHER): Payer: Medicare Other | Admitting: Ophthalmology

## 2020-11-23 LAB — NOVEL CORONAVIRUS, NAA: SARS-CoV-2, NAA: NOT DETECTED

## 2020-11-24 ENCOUNTER — Other Ambulatory Visit: Payer: Self-pay

## 2020-11-24 ENCOUNTER — Telehealth (INDEPENDENT_AMBULATORY_CARE_PROVIDER_SITE_OTHER): Payer: Medicare Other | Admitting: Nurse Practitioner

## 2020-11-24 ENCOUNTER — Encounter: Payer: Self-pay | Admitting: Nurse Practitioner

## 2020-11-24 VITALS — BP 114/68 | Wt 315.0 lb

## 2020-11-24 DIAGNOSIS — E1129 Type 2 diabetes mellitus with other diabetic kidney complication: Secondary | ICD-10-CM | POA: Diagnosis not present

## 2020-11-24 DIAGNOSIS — E211 Secondary hyperparathyroidism, not elsewhere classified: Secondary | ICD-10-CM | POA: Diagnosis not present

## 2020-11-24 DIAGNOSIS — IMO0002 Reserved for concepts with insufficient information to code with codable children: Secondary | ICD-10-CM

## 2020-11-24 DIAGNOSIS — R809 Proteinuria, unspecified: Secondary | ICD-10-CM | POA: Diagnosis not present

## 2020-11-24 DIAGNOSIS — E1165 Type 2 diabetes mellitus with hyperglycemia: Secondary | ICD-10-CM

## 2020-11-24 DIAGNOSIS — N183 Chronic kidney disease, stage 3 unspecified: Secondary | ICD-10-CM

## 2020-11-24 DIAGNOSIS — E782 Mixed hyperlipidemia: Secondary | ICD-10-CM | POA: Diagnosis not present

## 2020-11-24 DIAGNOSIS — E559 Vitamin D deficiency, unspecified: Secondary | ICD-10-CM | POA: Diagnosis not present

## 2020-11-24 DIAGNOSIS — I10 Essential (primary) hypertension: Secondary | ICD-10-CM | POA: Diagnosis not present

## 2020-11-24 DIAGNOSIS — E1122 Type 2 diabetes mellitus with diabetic chronic kidney disease: Secondary | ICD-10-CM | POA: Diagnosis not present

## 2020-11-24 DIAGNOSIS — I129 Hypertensive chronic kidney disease with stage 1 through stage 4 chronic kidney disease, or unspecified chronic kidney disease: Secondary | ICD-10-CM | POA: Diagnosis not present

## 2020-11-24 DIAGNOSIS — N189 Chronic kidney disease, unspecified: Secondary | ICD-10-CM | POA: Diagnosis not present

## 2020-11-24 DIAGNOSIS — Z20822 Contact with and (suspected) exposure to covid-19: Secondary | ICD-10-CM | POA: Diagnosis not present

## 2020-11-24 MED ORDER — ROSUVASTATIN CALCIUM 20 MG PO TABS: 20.0000 mg | ORAL_TABLET | Freq: Every day | ORAL | 3 refills | Status: DC

## 2020-11-24 MED ORDER — HUMULIN 70/30 KWIKPEN (70-30) 100 UNIT/ML ~~LOC~~ SUPN: 44.0000 [IU] | PEN_INJECTOR | Freq: Two times a day (BID) | SUBCUTANEOUS | 3 refills | Status: DC

## 2020-11-24 MED ORDER — OZEMPIC (1 MG/DOSE) 2 MG/1.5ML ~~LOC~~ SOPN: 1.0000 mg | PEN_INJECTOR | SUBCUTANEOUS | 3 refills | Status: DC
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
## 2020-11-24 NOTE — Patient Instructions (Signed)

## 2020-11-30 ENCOUNTER — Encounter (INDEPENDENT_AMBULATORY_CARE_PROVIDER_SITE_OTHER): Payer: Medicare Other | Admitting: Ophthalmology

## 2020-11-30 ENCOUNTER — Other Ambulatory Visit: Payer: Self-pay

## 2020-11-30 ENCOUNTER — Institutional Professional Consult (permissible substitution): Payer: Medicare Other | Admitting: Pulmonary Disease

## 2020-11-30 ENCOUNTER — Ambulatory Visit (INDEPENDENT_AMBULATORY_CARE_PROVIDER_SITE_OTHER): Payer: Medicare Other | Admitting: Ophthalmology

## 2020-11-30 ENCOUNTER — Encounter (INDEPENDENT_AMBULATORY_CARE_PROVIDER_SITE_OTHER): Payer: Self-pay | Admitting: Ophthalmology

## 2020-11-30 DIAGNOSIS — H35072 Retinal telangiectasis, left eye: Secondary | ICD-10-CM | POA: Diagnosis not present

## 2020-11-30 DIAGNOSIS — G4733 Obstructive sleep apnea (adult) (pediatric): Secondary | ICD-10-CM | POA: Diagnosis not present
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
## 2020-11-30 NOTE — Assessment & Plan Note (Signed)
Patient has been relatively noncompliant in the last years.  She has been instructed by me to tempt every maneuver to make this treatment to work.  She is waiting on a filter.  Upon restitution of nightly use for 2 weeks have asked the patient contact the office for a brief office visit where she will be not dilated and OCT macula to look for resolution of CME will occur.

## 2020-11-30 NOTE — Assessment & Plan Note (Signed)
Has yet to restart CPAP use awaiting for a part to arrive.  Patient has been relatively noncompliant in the last years.  She has been instructed by me to tempt every maneuver to make this treatment to work.  She is waiting on a filter.  Upon restitution of nightly use for 2 weeks have asked the patient contact the office for a brief office visit where she will be not dilated and OCT macula to look for resolution of CME will occur.

## 2020-12-01 ENCOUNTER — Telehealth: Payer: Self-pay | Admitting: Family Medicine

## 2020-12-01 ENCOUNTER — Encounter: Payer: Self-pay | Admitting: Family Medicine

## 2020-12-01 NOTE — Telephone Encounter (Signed)
°  Patient Consent for Virtual Visit         Kendra Hanson has provided verbal consent on 12/01/2020 for a virtual visit (video or telephone).   CONSENT FOR VIRTUAL VISIT FOR:  Kendra Hanson  By participating in this virtual visit I agree to the following:  I hereby voluntarily request, consent and authorize [facility] and its employed or contracted physicians, physician assistants, nurse practitioners or other licensed health care professionals (the Practitioner), to provide me with telemedicine health care services (the Services") as deemed necessary by the treating Practitioner. I acknowledge and consent to receive the Services by the Practitioner via telemedicine. I understand that the telemedicine visit will involve communicating with the Practitioner through live audiovisual communication technology and the disclosure of certain medical information by electronic transmission. I acknowledge that I have been given the opportunity to request an in-person assessment or other available alternative prior to the telemedicine visit and am voluntarily participating in the telemedicine visit.  I understand that I have the right to withhold or withdraw my consent to the use of telemedicine in the course of my care at any time, without affecting my right to future care or treatment, and that the Practitioner or I may terminate the telemedicine visit at any time. I understand that I have the right to inspect all information obtained and/or recorded in the course of the telemedicine visit and may receive copies of available information for a reasonable fee.  I understand that some of the potential risks of receiving the Services via telemedicine include:   Delay or interruption in medical evaluation due to technological equipment failure or disruption;  Information transmitted may not be sufficient (e.g. poor resolution of images) to allow for appropriate medical decision making by the  Practitioner; and/or   In rare instances, security protocols could fail, causing a breach of personal health information.  Furthermore, I acknowledge that it is my responsibility to provide information about my medical history, conditions and care that is complete and accurate to the best of my ability. I acknowledge that Practitioner's advice, recommendations, and/or decision may be based on factors not within their control, such as incomplete or inaccurate data provided by me or distortions of diagnostic images or specimens that may result from electronic transmissions. I understand that the practice of medicine is not an exact science and that Practitioner makes no warranties or guarantees regarding treatment outcomes. I acknowledge that a copy of this consent can be made available to me via my patient portal ([facility]), or I can request a printed copy by calling the office of [facility].    I understand that my insurance will be billed for this visit.   I have read or had this consent read to me.  I understand the contents of this consent, which adequately explains the benefits and risks of the Services being provided via telemedicine.   I have been provided ample opportunity to ask questions regarding this consent and the Services and have had my questions answered to my satisfaction.  I give my informed consent for the services to be provided through the use of telemedicine in my medical care

## 2020-12-02 ENCOUNTER — Encounter: Payer: Self-pay | Admitting: Pulmonary Disease

## 2020-12-02 ENCOUNTER — Other Ambulatory Visit: Payer: Self-pay

## 2020-12-02 ENCOUNTER — Ambulatory Visit: Payer: Medicare Other | Admitting: Pulmonary Disease

## 2020-12-02 VITALS — BP 122/68 | HR 85 | Temp 97.8°F | Ht 65.0 in | Wt 315.0 lb

## 2020-12-02 DIAGNOSIS — K219 Gastro-esophageal reflux disease without esophagitis: Secondary | ICD-10-CM

## 2020-12-02 DIAGNOSIS — G4733 Obstructive sleep apnea (adult) (pediatric): Secondary | ICD-10-CM

## 2020-12-02 MED ORDER — PANTOPRAZOLE SODIUM 20 MG PO TBEC: 20.0000 mg | DELAYED_RELEASE_TABLET | Freq: Every day | ORAL | 1 refills | Status: DC
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
## 2020-12-02 NOTE — Assessment & Plan Note (Signed)
Review of her original sleep study shows predominantly REM related OSA but desaturations were severe.  Unfortunately she has not been able to tolerate mask interface and CPAP very well. Today we will offer her mask desensitization visit with sleep tech in the lab and hopefully we can get her a better fitting mask, I would prefer a minimalist nasal mask over nasal pillows.  She complains of nasal dryness and does not tolerate the air shooting into her nose. Her machine is set at 7 cm which does not pressure but once she is able to show some compliance, we can try to get her an auto CPAP machine.  Unfortunately she has gained weight so would likely need higher pressure but perhaps will tolerate auto settings better between 5 to 10 cm.  I also discussed alternative therapies including oral appliance and inspire implant with her but she is not a good candidate for either of these therapies.  Weight loss was encouraged

## 2020-12-02 NOTE — Patient Instructions (Signed)
  Call [phone] for mask fitting session If ou are able to use the machine for a month, we can try to get you a new one

## 2020-12-02 NOTE — Assessment & Plan Note (Signed)
Agree with trial of Protonix for her subacute cough.  No obvious evidence of bronchitis.  She tested negative for COVID. If cough persist will obtain chest x-ray

## 2020-12-06 ENCOUNTER — Ambulatory Visit: Payer: Medicare Other | Admitting: Family Medicine

## 2020-12-08 ENCOUNTER — Other Ambulatory Visit: Payer: Self-pay | Admitting: Family Medicine

## 2020-12-14 ENCOUNTER — Ambulatory Visit (HOSPITAL_BASED_OUTPATIENT_CLINIC_OR_DEPARTMENT_OTHER): Payer: Medicare Other | Admitting: Pulmonary Disease

## 2020-12-14 ENCOUNTER — Other Ambulatory Visit: Payer: Self-pay

## 2020-12-17 ENCOUNTER — Encounter: Payer: Self-pay | Admitting: Family Medicine

## 2020-12-20 ENCOUNTER — Encounter: Payer: Self-pay | Admitting: Family Medicine

## 2020-12-20 ENCOUNTER — Telehealth (INDEPENDENT_AMBULATORY_CARE_PROVIDER_SITE_OTHER): Payer: Medicare Other | Admitting: Family Medicine

## 2020-12-20 ENCOUNTER — Other Ambulatory Visit: Payer: Self-pay

## 2020-12-20 VITALS — BP 117/68 | Ht 65.0 in | Wt 315.0 lb

## 2020-12-20 DIAGNOSIS — Z0289 Encounter for other administrative examinations: Secondary | ICD-10-CM | POA: Diagnosis not present

## 2020-12-20 NOTE — Patient Instructions (Signed)
F/U as before, call if you need me sooner  A letter will be provided in a timely fashion s we discussed, I look forward to having a draft sent to me  It is important that you exercise regularly at least 30 minutes 5 times a week. If you develop chest pain, have severe difficulty breathing, or feel very tired, stop exercising immediately and seek medical attention  Think about what you will eat, plan ahead. Choose " clean, green, fresh or frozen" over canned, processed or packaged foods which are more sugary, salty and fatty. 70 to 75% of food eaten should be vegetables and fruit. Three meals at set times with snacks allowed between meals, but they must be fruit or vegetables. Aim to eat over a 12 hour period , example 7 am to 7 pm, and STOP after  your last meal of the day. Drink water,generally about 64 ounces per day, no other drink is as healthy. Fruit juice is best enjoyed in a healthy way, by EATING the fruit. Thanks for choosing [facility], we consider it a privelige to serve you.

## 2020-12-23 ENCOUNTER — Encounter: Payer: Self-pay | Admitting: Student

## 2020-12-23 ENCOUNTER — Ambulatory Visit (INDEPENDENT_AMBULATORY_CARE_PROVIDER_SITE_OTHER): Payer: Medicare Other | Admitting: Student

## 2020-12-23 ENCOUNTER — Other Ambulatory Visit: Payer: Self-pay

## 2020-12-23 VITALS — BP 124/62 | HR 90 | Ht 65.0 in | Wt 313.1 lb

## 2020-12-23 DIAGNOSIS — E782 Mixed hyperlipidemia: Secondary | ICD-10-CM | POA: Diagnosis not present

## 2020-12-23 DIAGNOSIS — G4733 Obstructive sleep apnea (adult) (pediatric): Secondary | ICD-10-CM

## 2020-12-23 DIAGNOSIS — I10 Essential (primary) hypertension: Secondary | ICD-10-CM | POA: Diagnosis not present

## 2020-12-23 DIAGNOSIS — R002 Palpitations: Secondary | ICD-10-CM | POA: Diagnosis not present

## 2020-12-23 DIAGNOSIS — I890 Lymphedema, not elsewhere classified: Secondary | ICD-10-CM | POA: Diagnosis not present

## 2020-12-23 MED ORDER — DILTIAZEM HCL ER COATED BEADS 360 MG PO CP24: 360.0000 mg | ORAL_CAPSULE | Freq: Every day | ORAL | 3 refills | Status: DC

## 2020-12-23 NOTE — Patient Instructions (Signed)
Medication Instructions:  Your physician recommends that you continue on your current medications as directed. Please refer to the Current Medication list given to you today.  *If you need a refill on your cardiac medications before your next appointment, please call your pharmacy*   Lab Work: None Today If you have labs (blood work) drawn today and your tests are completely normal, you will receive your results only by: Marland Kitchen MyChart Message (if you have MyChart) OR . A paper copy in the mail If you have any lab test that is abnormal or we need to change your treatment, we will call you to review the results.   Testing/Procedures: None Today   Follow-Up: At [facility], you and your health needs are our priority.  As part of our continuing mission to provide you with exceptional heart care, we have created designated Provider Care Teams.  These Care Teams include your primary Cardiologist (physician) and Advanced Practice Providers (APPs -  Physician Assistants and Nurse Practitioners) who all work together to provide you with the care you need, when you need it.  We recommend signing up for the patient portal called "MyChart".  Sign up information is provided on this After Visit Summary.  MyChart is used to connect with patients for Virtual Visits (Telemedicine).  Patients are able to view lab/test results, encounter notes, upcoming appointments, etc.  Non-urgent messages can be sent to your provider as well.   To learn more about what you can do with MyChart, go to [email or web address].    Your next appointment:   12 months  The format for your next appointment:   In Person  Provider:   You may see Carlyle Dolly, MD or one of the following Advanced Practice Providers on your designated Care Team:    Bernerd Pho, PA-C     Other Instructions None Today
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
## 2020-12-27 ENCOUNTER — Encounter: Payer: Self-pay | Admitting: Family Medicine

## 2020-12-27 DIAGNOSIS — Z0289 Encounter for other administrative examinations: Secondary | ICD-10-CM | POA: Insufficient documentation

## 2020-12-27 NOTE — Assessment & Plan Note (Signed)
Visit specifically to request that hardship letter  For military to post son physically close to ailing parents based on current health and morbidities  Will review records, both parents have given permission to provide their protected health info to th [place], and letter will be provided within the next 1 week

## 2020-12-27 NOTE — Progress Notes (Signed)
Virtual Visit via Video Note  I connected with Kendra Hanson on 12/27/20 at  2:00 PM EST by a video enabled telemedicine application and verified that I am speaking with the correct person using two identifiers.  Location: Patient: home Provider: office   I discussed the limitations of evaluation and management by telemedicine and the availability of in person appointments. The patient expressed understanding and agreed to proceed.  History of Present Illness:   Reason for this visit, in which her son, Kendra Hanson, is also Technical sales engineer, is because son who is in the [employer], is requesting a letter regarding his parents failing health be provided to support placing him within a close radius to where they reside , so that he can provide the physical and mental support that they need and that no other family member is close enough and capable of providing Both parents doe have multiple chronic medical conditions, and both Kendra Hanson and her spouse Kendra Hanson , give permission for their medical information to be provided to the [place] for review  An appropriate letter will be provided outlining and detailing this for submission for military review  Observations/Objective: BP 117/68   Ht 5\' 5"  (1.651 m)   Wt (!) 315 lb (142.9 kg)   BMI 52.42 kg/m  Good communication with no confusion and intact memory. Alert and oriented x 3 No signs of respiratory distress during speech    Assessment and Plan:  Encounter to obtain excuse from work Visit specifically to request that hardship letter  For [employer] to post son physically close to ailing parents based on current health and morbidities  Will review records, both parents have given permission to provide their protected health info to th [place], and letter will be provided within the next 1 week   Follow Up Instructions:    I discussed the assessment and treatment plan with the patient. The patient was provided an opportunity to ask questions and  all were answered. The patient agreed with the plan and demonstrated an understanding of the instructions.   The patient was advised to call back or seek an in-person evaluation if the symptoms worsen or if the condition fails to improve as anticipated.  I provided  15 minutes of non-face-to-face time during this encounter.   Tula Nakayama, MD

## 2020-12-28 ENCOUNTER — Encounter: Payer: Self-pay | Admitting: Family Medicine

## 2020-12-28 ENCOUNTER — Other Ambulatory Visit: Payer: Self-pay

## 2020-12-28 ENCOUNTER — Other Ambulatory Visit: Payer: Medicare Other

## 2020-12-28 ENCOUNTER — Telehealth (INDEPENDENT_AMBULATORY_CARE_PROVIDER_SITE_OTHER): Payer: Medicare Other | Admitting: Family Medicine

## 2020-12-28 VITALS — BP 125/88 | Ht 65.0 in | Wt 315.0 lb

## 2020-12-28 DIAGNOSIS — G4733 Obstructive sleep apnea (adult) (pediatric): Secondary | ICD-10-CM | POA: Diagnosis not present

## 2020-12-28 DIAGNOSIS — E113552 Type 2 diabetes mellitus with stable proliferative diabetic retinopathy, left eye: Secondary | ICD-10-CM

## 2020-12-28 DIAGNOSIS — M170 Bilateral primary osteoarthritis of knee: Secondary | ICD-10-CM | POA: Diagnosis not present

## 2020-12-28 DIAGNOSIS — Z20822 Contact with and (suspected) exposure to covid-19: Secondary | ICD-10-CM

## 2020-12-28 DIAGNOSIS — I110 Hypertensive heart disease with heart failure: Secondary | ICD-10-CM | POA: Diagnosis not present

## 2020-12-28 DIAGNOSIS — R29898 Other symptoms and signs involving the musculoskeletal system: Secondary | ICD-10-CM | POA: Diagnosis not present

## 2020-12-28 DIAGNOSIS — J029 Acute pharyngitis, unspecified: Secondary | ICD-10-CM

## 2020-12-28 DIAGNOSIS — M542 Cervicalgia: Secondary | ICD-10-CM

## 2020-12-28 DIAGNOSIS — Z01419 Encounter for gynecological examination (general) (routine) without abnormal findings: Secondary | ICD-10-CM

## 2020-12-28 DIAGNOSIS — N1832 Chronic kidney disease, stage 3b: Secondary | ICD-10-CM

## 2020-12-28 MED ORDER — MOMETASONE FUROATE 50 MCG/ACT NA SUSP: 2.0000 | Freq: Every day | NASAL | 12 refills | Status: DC

## 2020-12-28 MED ORDER — MONTELUKAST SODIUM 10 MG PO TABS: 10.0000 mg | ORAL_TABLET | Freq: Every day | ORAL | 3 refills | Status: DC

## 2020-12-28 NOTE — Patient Instructions (Addendum)
F/U in 4.5 months, call if you need me sooner  You are referred to [facility] for pap  We will get eye exam do in January from Dr Zadie Rhine and [facility] in [place] for diabetic eye exam  We will send to Dr Marylene Buerger for January labs including any urine testing that was done       Please work on lowering fried and fatty foods also sugar and starches, so that your blood sugar and cholesterol will improve  It is important that you exercise regularly at least 30 minutes 5 times a week. If you develop chest pain, have severe difficulty breathing, or feel very tired, stop exercising immediately and seek medical attention   Think about what you will eat, plan ahead. Choose " clean, green, fresh or frozen" over canned, processed or packaged foods which are more sugary, salty and fatty. 70 to 75% of food eaten should be vegetables and fruit. Three meals at set times with snacks allowed between meals, but they must be fruit or vegetables. Aim to eat over a 12 hour period , example 7 am to 7 pm, and STOP after  your last meal of the day. Drink water,generally about 64 ounces per day, no other drink is as healthy. Fruit juice is best enjoyed in a healthy way, by EATING the fruit.  Thanks for choosing [facility], we consider it a privelige to serve you.

## 2020-12-29 LAB — HM DIABETES EYE EXAM

## 2020-12-29 LAB — NOVEL CORONAVIRUS, NAA: SARS-CoV-2, NAA: NOT DETECTED

## 2020-12-29 LAB — SARS-COV-2, NAA 2 DAY TAT

## 2020-12-31 ENCOUNTER — Institutional Professional Consult (permissible substitution): Payer: Medicare Other | Admitting: Pulmonary Disease

## 2020-12-31 DIAGNOSIS — Z789 Other specified health status: Secondary | ICD-10-CM

## 2021-01-02 ENCOUNTER — Encounter: Payer: Self-pay | Admitting: Family Medicine

## 2021-01-02 ENCOUNTER — Telehealth: Payer: Self-pay | Admitting: Family Medicine

## 2021-01-02 DIAGNOSIS — J029 Acute pharyngitis, unspecified: Secondary | ICD-10-CM | POA: Insufficient documentation

## 2021-01-02 NOTE — Assessment & Plan Note (Signed)
Followed by Nephrology, improving with improved blood pressure and blood sugar control

## 2021-01-02 NOTE — Progress Notes (Signed)
Virtual Visit via Telephone Note  I connected with Kendra Hanson on 01/02/21 at  2:40 PM EST by telephone and verified that I am speaking with the correct person using two identifiers.  Location: Patient: : home Provider: office   I discussed the limitations, risks, security and privacy concerns of performing an evaluation and management service by telephone and the availability of in person appointments. I also discussed with the patient that there may be a patient responsible charge related to this service. The patient expressed understanding and agreed to proceed.   History of Present Illness: 4 day h/o sore throat and prior to this had a non productive cough for approximately 1 week, concerned re Covid though fully vaccinated  No known recent Covid exposure Denies polyuria, polydipsia, blurred vision , or hypoglycemic episodes. FBG of 11 today F/U chronic problems and address any new or current concerns. Review and update medications and allergies. Review recent lab and radiologic data . Update routine health maintainace. Review an encourage improved health habits to include nutrition, exercise and  sleep .  Denies recent fever or chills. Denies sinus pressure, nasal congestion, ear pain Denies chest pains, palpitations and leg swelling Denies abdominal pain, nausea, vomiting,diarrhea or constipation.   Denies dysuria, frequency, hesitancy or incontinence. Denies joint pain, swelling and limitation in mobility. Denies headaches, seizures, numbness, or tingling. Denies depression, anxiety or insomnia. Denies skin break down or rash.       Observations/Objective: BP 125/88   Ht 5\' 5"  (1.651 m)   Wt (!) 315 lb (142.9 kg)   BMI 52.42 kg/m   Good communication with no confusion and intact memory. 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  The importance of healthy food choices with portion control discussed, as well as eating regularly and within a 12 hour window most days. The need to choose "clean , green" food 50 to 75% of the time is discussed, as well as to make water the primary drink and set a goal of 64 ounces water daily.    Weight /BMI 12/28/2020 12/23/2020 12/20/2020  WEIGHT 315 lb 313 lb 1.6 oz 315 lb  HEIGHT 5\' 5"  5\' 5"  5\' 5"   BMI 52.42 kg/m2 52.1 kg/m2 52.42 kg/m2      Hypertensive heart disease with heart failure (HCC) Controlled, no change in medication DASH diet and commitment to daily physical activity for a minimum of 30 minutes discussed and encouraged, as a part of hypertension management. The importance of attaining a healthy weight is also discussed.  BP/Weight 12/28/2020 12/23/2020 12/20/2020 12/02/2020 11/24/2020 10/12/2020 56/43/3295  Systolic BP 188 416 606 301 601 093 235  Diastolic BP 88 62 68 68 68 80 78  Wt. 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 The patient was provided an opportunity to ask questions and all were answered. The patient agreed with the plan and demonstrated an understanding of the instructions.   The patient was advised to call back or seek an in-person evaluation if the symptoms worsen or if the condition fails to improve as anticipated.  I provided 22 minutes of non-face-to-face time during this encounter.   Tula Nakayama, MD

## 2021-01-02 NOTE — Telephone Encounter (Signed)
pls let her know that I reviewed recent  labs from [facility], and they are very good She DOES need microalb done a so please order , due March 24 or after, please let her know, thanks

## 2021-01-02 NOTE — Assessment & Plan Note (Signed)
  Patient re-educated about  the importance of commitment to a  minimum of 150 minutes of exercise per week as able.  The importance of healthy food choices with portion control discussed, as well as eating regularly and within a 12 hour window most days. The need to choose "clean , green" food 50 to 75% of the time is discussed, as well as to make water the primary drink and set a goal of 64 ounces water daily.    Weight /BMI 12/28/2020 12/23/2020 12/20/2020  WEIGHT 315 lb 313 lb 1.6 oz 315 lb  HEIGHT 5\' 5"  5\' 5"  5\' 5"   BMI 52.42 kg/m2 52.1 kg/m2 52.42 kg/m2

## 2021-01-02 NOTE — Assessment & Plan Note (Signed)
Debilitating and limiting mobility, no recent fall reported, limit ability to climb stairs and to bend

## 2021-01-02 NOTE — Assessment & Plan Note (Signed)
Controlled, no change in medication DASH diet and commitment to daily physical activity for a minimum of 30 minutes discussed and encouraged, as a part of hypertension management. The importance of attaining a healthy weight is also discussed.  BP/Weight 12/28/2020 12/23/2020 12/20/2020 12/02/2020 11/24/2020 10/12/2020 05/08/9484  Systolic BP 462 703 500 938 182 993 716  Diastolic BP 88 62 68 68 68 80 78  Wt. (Lbs) 315 313.1 315 315 315 310 314.04  BMI 52.42 52.1 52.42 52.42 52.42 51.59 52.26

## 2021-01-02 NOTE — Assessment & Plan Note (Addendum)
covid test ordered, low suspicion that it is positive, and symptom is inconsistent with bacterial pathology

## 2021-01-02 NOTE — Assessment & Plan Note (Signed)
Updated lab from nephrology reviewed and she is currently well controlled, no med change

## 2021-01-02 NOTE — Assessment & Plan Note (Signed)
Updated record to be obtained, reports eye exam done in 11/2020, has bilateral retinopathy

## 2021-01-02 NOTE — Assessment & Plan Note (Signed)
Reports daily use

## 2021-01-02 NOTE — Assessment & Plan Note (Signed)
Disabled due to left upper extremity weakness, and pain, despite surgery no improvement

## 2021-01-02 NOTE — Assessment & Plan Note (Signed)
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

## 2021-01-03 ENCOUNTER — Other Ambulatory Visit: Payer: Self-pay

## 2021-01-03 ENCOUNTER — Inpatient Hospital Stay (HOSPITAL_COMMUNITY): Admission: RE | Admit: 2021-01-03 | Payer: Medicare Other | Source: Ambulatory Visit

## 2021-01-03 DIAGNOSIS — Z794 Long term (current) use of insulin: Secondary | ICD-10-CM

## 2021-01-03 DIAGNOSIS — N1832 Chronic kidney disease, stage 3b: Secondary | ICD-10-CM

## 2021-01-03 NOTE — Telephone Encounter (Signed)
Pt informed. Test ordered.

## 2021-01-06 ENCOUNTER — Other Ambulatory Visit: Payer: Self-pay | Admitting: Internal Medicine

## 2021-01-06 DIAGNOSIS — E876 Hypokalemia: Secondary | ICD-10-CM

## 2021-01-07 ENCOUNTER — Telehealth: Payer: Self-pay

## 2021-01-07 NOTE — Telephone Encounter (Signed)
Pt called requesting samples of Ozempic, samples given to pt per Whitney Reardon,FNP.

## 2021-01-10 ENCOUNTER — Ambulatory Visit (HOSPITAL_COMMUNITY)
Admission: RE | Admit: 2021-01-10 | Discharge: 2021-01-10 | Disposition: A | Discharge status: Home / Self Care | Payer: Medicare Other | Source: Ambulatory Visit | Attending: Family Medicine | Admitting: Family Medicine

## 2021-01-10 ENCOUNTER — Other Ambulatory Visit: Payer: Self-pay

## 2021-01-10 DIAGNOSIS — Z1231 Encounter for screening mammogram for malignant neoplasm of breast: Secondary | ICD-10-CM | POA: Diagnosis not present

## 2021-01-11 ENCOUNTER — Ambulatory Visit (INDEPENDENT_AMBULATORY_CARE_PROVIDER_SITE_OTHER): Payer: Medicare Other | Admitting: Ophthalmology

## 2021-01-11 ENCOUNTER — Encounter (INDEPENDENT_AMBULATORY_CARE_PROVIDER_SITE_OTHER): Payer: Self-pay | Admitting: Ophthalmology

## 2021-01-11 ENCOUNTER — Other Ambulatory Visit: Payer: Self-pay

## 2021-01-11 DIAGNOSIS — I872 Venous insufficiency (chronic) (peripheral): Secondary | ICD-10-CM

## 2021-01-11 DIAGNOSIS — H35352 Cystoid macular degeneration, left eye: Secondary | ICD-10-CM | POA: Diagnosis not present

## 2021-01-11 DIAGNOSIS — H35072 Retinal telangiectasis, left eye: Secondary | ICD-10-CM

## 2021-01-11 NOTE — Assessment & Plan Note (Signed)
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  Patient is in fact been confirmed to have severe sleep apnea has been unable to compliantly use several CPAP vendor nasal mask alternatives.

## 2021-01-11 NOTE — Patient Instructions (Signed)
Patient instructed to contact the office promptly should she obtain the ability to sustain, continued use of CPAP for 7 days consecutively.  I instructed the patient to call the office and to ask to speak to the managers that the patient can be seen the next day the office is open for an undilated examination and OCT measures

## 2021-01-11 NOTE — Assessment & Plan Note (Addendum)
Patient I have confirmed that the overall the macular findings left eye are stable but I have concerns that parafoveal macular edema will increase in fact there is a small serous retinal detachment developing in the center of the left eye.  We had a lengthy discussion about potential motivating issues and cognitive behavioral therapy techniques could help her to adjust slowly to the use of CPAP as she changes but particularly to try to not take a nap day of attempting its use and perhaps for several days in a row so that extreme fatigue may over take and allow her to compliantly use the machinery for at least some hours of the night  Patient has poor success in compliance with CPAP use with multiple mask.  She is only successfully attempted 1 night of intense usage and just unable to tolerate despite max desensitization  I spent much time giving the patient cognitive behavioral therapy type tricks to encourage attempts at compliance.  These include  1.  Permission not to like the treatment  2.  Avoid napping the day of attempted mask and wear so as to increased fatigue and potential acceptance of its use  3 permission for patient to use it only for several hours a night initially and remove it if she awakens or becomes uncomfortable  4 I again discussed the numerous patients that have improvement in the right condition but more importantly and perhaps more satisfactorily improvement in sense of wellbeing, health and energy the next day should enhanced treatment of nightly hypoxia of sleep apnea occur
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
## 2021-01-20 ENCOUNTER — Ambulatory Visit (HOSPITAL_COMMUNITY): Payer: Medicare Other

## 2021-01-20 ENCOUNTER — Ambulatory Visit: Payer: Medicare Other | Admitting: Vascular Surgery

## 2021-01-20 DIAGNOSIS — M170 Bilateral primary osteoarthritis of knee: Secondary | ICD-10-CM | POA: Diagnosis not present

## 2021-01-26 ENCOUNTER — Other Ambulatory Visit: Payer: Self-pay | Admitting: Nurse Practitioner

## 2021-01-26 DIAGNOSIS — N183 Chronic kidney disease, stage 3 unspecified: Secondary | ICD-10-CM

## 2021-01-26 DIAGNOSIS — IMO0002 Reserved for concepts with insufficient information to code with codable children: Secondary | ICD-10-CM

## 2021-01-27 ENCOUNTER — Encounter: Payer: Self-pay | Admitting: Family Medicine

## 2021-02-15 DIAGNOSIS — H10502 Unspecified blepharoconjunctivitis, left eye: Secondary | ICD-10-CM | POA: Diagnosis not present

## 2021-02-17 DIAGNOSIS — N1832 Chronic kidney disease, stage 3b: Secondary | ICD-10-CM | POA: Diagnosis not present

## 2021-02-17 DIAGNOSIS — I129 Hypertensive chronic kidney disease with stage 1 through stage 4 chronic kidney disease, or unspecified chronic kidney disease: Secondary | ICD-10-CM | POA: Diagnosis not present

## 2021-02-17 DIAGNOSIS — E211 Secondary hyperparathyroidism, not elsewhere classified: Secondary | ICD-10-CM | POA: Diagnosis not present

## 2021-02-17 DIAGNOSIS — N183 Chronic kidney disease, stage 3 unspecified: Secondary | ICD-10-CM | POA: Diagnosis not present

## 2021-02-17 DIAGNOSIS — N189 Chronic kidney disease, unspecified: Secondary | ICD-10-CM | POA: Diagnosis not present

## 2021-02-17 DIAGNOSIS — E1122 Type 2 diabetes mellitus with diabetic chronic kidney disease: Secondary | ICD-10-CM | POA: Diagnosis not present

## 2021-02-17 DIAGNOSIS — Z794 Long term (current) use of insulin: Secondary | ICD-10-CM | POA: Diagnosis not present

## 2021-02-19 LAB — MICROALBUMIN, URINE: Microalbumin, Urine: 17.8 ug/mL

## 2021-02-21 NOTE — Patient Instructions (Signed)

## 2021-02-22 ENCOUNTER — Ambulatory Visit (INDEPENDENT_AMBULATORY_CARE_PROVIDER_SITE_OTHER): Payer: Medicare Other | Admitting: Nurse Practitioner

## 2021-02-22 ENCOUNTER — Encounter: Payer: Self-pay | Admitting: Nurse Practitioner

## 2021-02-22 ENCOUNTER — Other Ambulatory Visit: Payer: Self-pay

## 2021-02-22 VITALS — BP 135/75 | HR 80 | Ht 65.0 in | Wt 316.0 lb

## 2021-02-22 DIAGNOSIS — N183 Chronic kidney disease, stage 3 unspecified: Secondary | ICD-10-CM

## 2021-02-22 DIAGNOSIS — E559 Vitamin D deficiency, unspecified: Secondary | ICD-10-CM

## 2021-02-22 DIAGNOSIS — I10 Essential (primary) hypertension: Secondary | ICD-10-CM | POA: Diagnosis not present

## 2021-02-22 DIAGNOSIS — E782 Mixed hyperlipidemia: Secondary | ICD-10-CM

## 2021-02-22 DIAGNOSIS — E1165 Type 2 diabetes mellitus with hyperglycemia: Secondary | ICD-10-CM | POA: Diagnosis not present

## 2021-02-22 DIAGNOSIS — E1122 Type 2 diabetes mellitus with diabetic chronic kidney disease: Secondary | ICD-10-CM

## 2021-02-22 DIAGNOSIS — IMO0002 Reserved for concepts with insufficient information to code with codable children: Secondary | ICD-10-CM

## 2021-02-22 LAB — POCT GLYCOSYLATED HEMOGLOBIN (HGB A1C): HbA1c, POC (controlled diabetic range): 8.4 % — AB (ref 0.0–7.0)

## 2021-02-22 MED ORDER — GLIPIZIDE ER 5 MG PO TB24: 5.0000 mg | ORAL_TABLET | Freq: Every day | ORAL | 3 refills | Status: DC
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
## 2021-02-23 DIAGNOSIS — E1129 Type 2 diabetes mellitus with other diabetic kidney complication: Secondary | ICD-10-CM | POA: Diagnosis not present

## 2021-02-23 DIAGNOSIS — I129 Hypertensive chronic kidney disease with stage 1 through stage 4 chronic kidney disease, or unspecified chronic kidney disease: Secondary | ICD-10-CM | POA: Diagnosis not present

## 2021-02-23 DIAGNOSIS — I5032 Chronic diastolic (congestive) heart failure: Secondary | ICD-10-CM | POA: Diagnosis not present

## 2021-02-23 DIAGNOSIS — N189 Chronic kidney disease, unspecified: Secondary | ICD-10-CM | POA: Diagnosis not present

## 2021-02-23 DIAGNOSIS — R809 Proteinuria, unspecified: Secondary | ICD-10-CM | POA: Diagnosis not present

## 2021-02-23 DIAGNOSIS — E211 Secondary hyperparathyroidism, not elsewhere classified: Secondary | ICD-10-CM | POA: Diagnosis not present

## 2021-02-23 DIAGNOSIS — E1122 Type 2 diabetes mellitus with diabetic chronic kidney disease: Secondary | ICD-10-CM | POA: Diagnosis not present

## 2021-02-28 ENCOUNTER — Other Ambulatory Visit: Payer: Self-pay | Admitting: Nurse Practitioner

## 2021-03-08 ENCOUNTER — Ambulatory Visit (INDEPENDENT_AMBULATORY_CARE_PROVIDER_SITE_OTHER): Payer: Medicare Other | Admitting: Adult Health

## 2021-03-08 ENCOUNTER — Other Ambulatory Visit (HOSPITAL_COMMUNITY)
Admission: RE | Admit: 2021-03-08 | Discharge: 2021-03-08 | Disposition: A | Discharge status: Home / Self Care | Payer: Medicare Other | Source: Ambulatory Visit | Attending: Adult Health | Admitting: Adult Health

## 2021-03-08 ENCOUNTER — Encounter: Payer: Self-pay | Admitting: Adult Health

## 2021-03-08 ENCOUNTER — Other Ambulatory Visit: Payer: Self-pay

## 2021-03-08 VITALS — BP 146/92 | HR 89 | Ht 65.0 in | Wt 317.4 lb

## 2021-03-08 DIAGNOSIS — Z01419 Encounter for gynecological examination (general) (routine) without abnormal findings: Secondary | ICD-10-CM | POA: Diagnosis not present

## 2021-03-08 DIAGNOSIS — N950 Postmenopausal bleeding: Secondary | ICD-10-CM | POA: Insufficient documentation

## 2021-03-08 DIAGNOSIS — Z1211 Encounter for screening for malignant neoplasm of colon: Secondary | ICD-10-CM | POA: Insufficient documentation

## 2021-03-08 LAB — HEMOCCULT GUIAC POC 1CARD (OFFICE): Fecal Occult Blood, POC: NEGATIVE

## 2021-03-08 NOTE — Progress Notes (Signed)
[name], female   DOB: [birth year], 60 y.o.   MRN: [id number] History of Present Illness:  Retina is a 60 year old black female, married, PM in for well woman gyn exam and pap, and has had vaginal bleeding for about  4 days last month. PCP is Dr Kendra Hanson.  Current Medications, Allergies, Past Medical History, Past Surgical History, Family History and Social History were reviewed in [employer] record.     Review of Systems: Patient denies any headaches, hearing loss, fatigue, blurred vision, shortness of breath, chest pain, abdominal pain, problems with bowel movements, or intercourse. No joint pain or mood swings. +PMB +SUI   Physical Exam:BP (!) 146/92 (BP Location: Right Wrist, Patient Position: Sitting, Cuff Size: Normal)   Pulse 89   Ht 5\' 5"  (1.651 m)   Wt (!) 317 lb 6.4 oz (144 kg)   BMI 52.82 kg/m  General:  Well developed, well nourished, no acute distress Skin:  Warm and dry Neck:  Midline trachea, normal thyroid, good ROM, no lymphadenopathy,no carotid bruits heard Lungs; Clear to auscultation bilaterally Breast:  No dominant palpable mass, retraction, or nipple discharge Cardiovascular: Regular rate and rhythm Abdomen:  Soft, non tender, no hepatosplenomegaly.obese Pelvic:  External genitalia is normal in appearance, no lesions.  The vagina is normal in appearance. Urethra has no lesions or masses. The cervix is smooth, pap with HR HPV genotyping performed.  Uterus is felt to be normal size, shape, and contour.  No adnexal masses or tenderness noted.Bladder is non tender, no masses felt. Rectal: Good sphincter tone, no polyps, or hemorrhoids felt.  Hemoccult negative. Extremities/musculoskeletal:  No varicosities noted, no clubbing or cyanosis, has swelling BLE, has lymphoedema  Psych:  No mood changes, alert and cooperative,seems happy AA is 0 Fall risk is low PHQ 9 score is 4 GAD 7 score is 1  [facility] - 03/08/21 1037       Pregnancy Intention Screening   Does the patient want to become pregnant in the next year? No    Does the patient's partner want to become pregnant in the next year? No    Would the patient like to discuss contraceptive options today? No      Contraception Wrap Up   Current Method No Method - Other Reason   PM   End Method No Method - Other Reason   PM   Contraception Counseling Provided No          Examination chaperoned by Estill Bamberg LPN   Impression and Plan: 1. Encounter for gynecological examination with Papanicolaou smear of cervix Pap sent Physical in 1 year with PCP Pap in 3 years if normal Labs with PCP Mammogram yearly Colonoscopy per GI  2. PMB (postmenopausal bleeding) Discussed PMB and need to get [place] to assess endometrial lining and if thickened will need endometrial biopsy to rule out endometrial cancer  Will get GYN [place] and see me in about 4 weeks   3. Encounter for screening fecal occult blood testing

## 2021-03-10 ENCOUNTER — Encounter: Payer: Self-pay | Admitting: Nurse Practitioner

## 2021-03-10 LAB — CYTOLOGY - PAP
Adequacy: ABSENT
Comment: NEGATIVE
Diagnosis: NEGATIVE
High risk HPV: NEGATIVE

## 2021-03-16 ENCOUNTER — Encounter: Payer: Self-pay | Admitting: Nurse Practitioner

## 2021-03-21 ENCOUNTER — Encounter: Payer: Self-pay | Admitting: Family Medicine

## 2021-03-22 ENCOUNTER — Encounter: Payer: Self-pay | Admitting: Internal Medicine

## 2021-03-22 ENCOUNTER — Telehealth (INDEPENDENT_AMBULATORY_CARE_PROVIDER_SITE_OTHER): Payer: Medicare Other | Admitting: Internal Medicine

## 2021-03-22 ENCOUNTER — Other Ambulatory Visit: Payer: Self-pay

## 2021-03-22 ENCOUNTER — Encounter: Payer: Self-pay | Admitting: Family Medicine

## 2021-03-22 VITALS — BP 147/75

## 2021-03-22 DIAGNOSIS — J209 Acute bronchitis, unspecified: Secondary | ICD-10-CM | POA: Diagnosis not present

## 2021-03-22 DIAGNOSIS — Z20822 Contact with and (suspected) exposure to covid-19: Secondary | ICD-10-CM

## 2021-03-22 MED ORDER — AZITHROMYCIN 250 MG PO TABS: ORAL_TABLET | ORAL | 0 refills | Status: AC

## 2021-03-22 MED ORDER — PREDNISONE 20 MG PO TABS: 20.0000 mg | ORAL_TABLET | Freq: Every day | ORAL | 0 refills | Status: DC
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
## 2021-03-22 NOTE — Patient Instructions (Signed)
Please get tested for COVID soon and contact us with your COVID test result.  Please start taking prednisone as prescribed.  Continue to take Tessalon or Mucinex as needed for cough.  If you have shortness of breath or chest pain with palpitations, please go to ER.

## 2021-03-22 NOTE — Telephone Encounter (Signed)
Pt notified with verbal understanding  °

## 2021-03-23 ENCOUNTER — Other Ambulatory Visit: Payer: Self-pay | Admitting: Nurse Practitioner

## 2021-03-28 ENCOUNTER — Ambulatory Visit (HOSPITAL_COMMUNITY)
Admission: RE | Admit: 2021-03-28 | Discharge: 2021-03-28 | Disposition: A | Discharge status: Home / Self Care | Payer: Medicare Other | Source: Ambulatory Visit | Attending: Internal Medicine | Admitting: Internal Medicine

## 2021-03-28 ENCOUNTER — Encounter: Payer: Self-pay | Admitting: Internal Medicine

## 2021-03-28 ENCOUNTER — Other Ambulatory Visit: Payer: Self-pay | Admitting: Internal Medicine

## 2021-03-28 DIAGNOSIS — J209 Acute bronchitis, unspecified: Secondary | ICD-10-CM | POA: Insufficient documentation

## 2021-03-28 DIAGNOSIS — R059 Cough, unspecified: Secondary | ICD-10-CM | POA: Diagnosis not present

## 2021-04-12 ENCOUNTER — Ambulatory Visit: Payer: Medicare Other | Admitting: Adult Health

## 2021-04-12 ENCOUNTER — Ambulatory Visit (INDEPENDENT_AMBULATORY_CARE_PROVIDER_SITE_OTHER): Payer: Medicare Other

## 2021-04-12 ENCOUNTER — Encounter: Payer: Self-pay | Admitting: Adult Health

## 2021-04-12 ENCOUNTER — Other Ambulatory Visit: Payer: Self-pay

## 2021-04-12 VITALS — BP 146/79 | HR 87 | Ht 65.0 in | Wt 360.0 lb

## 2021-04-12 DIAGNOSIS — D219 Benign neoplasm of connective and other soft tissue, unspecified: Secondary | ICD-10-CM | POA: Diagnosis not present

## 2021-04-12 DIAGNOSIS — N950 Postmenopausal bleeding: Secondary | ICD-10-CM

## 2021-04-12 MED ORDER — PROGESTERONE 200 MG PO CAPS: ORAL_CAPSULE | ORAL | 12 refills | Status: DC

## 2021-04-12 NOTE — Progress Notes (Signed)
  Subjective:     [name], female   DOB: December 05, 1960, 60 y.o.   MRN: [id number]  HPI Kendra Hanson is a 60 year old black female, married, PM, in for GYN [place] for PMB, no more bleeding. Lab Results  Component Value Date   DIAGPAP  03/08/2021    - Negative for intraepithelial lesion or malignancy (NILM)   [place] Negative 03/08/2021   PCP is Dr Moshe Cipro.  Review of Systems No more bleeding +tired but HGB good 12.4 in Aprl at Dr Toya Smothers office  Reviewed past medical,surgical, social and family history. Reviewed medications and allergies.     Objective:   Physical Exam BP (!) 146/79 (BP Location: Right Arm, Patient Position: Sitting, Cuff Size: Large)   Pulse 87   Ht 5\' 5"  (1.651 m)   Wt (!) 360 lb (163.3 kg)   BMI 59.91 kg/m    Skin warm and dry. Lungs: clear to ausculation bilaterally. Cardiovascular: regular rate and rhythm.  [place] - 04/12/21 9528      Pregnancy Intention Screening   Does the patient want to become pregnant in the next year? No    Does the patient's partner want to become pregnant in the next year? No    Would the patient like to discuss contraceptive options today? No      Contraception Wrap Up   Current Method No Method - Other Reason   Post-menopausal   End Method No Method - Other Reason    Contraception Counseling Provided No         . Reviewed [place] with pt and husband: US showed EEC 2.5 mm, uterus 23 ml, has multiple fibroids with 7 x 5.3 x 6.1 cm fundal fibroid and ovaries not seen.Discussed with Dr Elonda Husky and will start on Prometrium 200 mg  at bedtime, due to estrogen from adipose tissue  Assessment:     1. Fibroids Will follow   2. PMB (postmenopausal bleeding),history of  Will start Prometrium Meds ordered this encounter  Medications  . progesterone (PROMETRIUM) 200 MG capsule    Sig: Take 1 at bedtime    Dispense:  30 capsule    Refill:  12    Order Specific Question:   Supervising Provider    Answer:   Florian Buff  [[id number]]      Plan:     Follow up in 1 year or sooner if needed

## 2021-04-12 NOTE — Progress Notes (Signed)
PELVIC US TA/TV: enlarged heterogeneous uterus with mult fibroids,(#1) pedunculated fundal fibroid 7 x 5.3 x 6.2 cm,(#2) calcified intramural fibroid LUS 3.5 x 2.7 x 1.7 cm,EEC 2.5 mm (only visualized on TA images),ovaries not visualized,no pain during ultrasound,no free fluid

## 2021-04-19 ENCOUNTER — Encounter: Payer: Self-pay | Admitting: Nurse Practitioner

## 2021-05-09 ENCOUNTER — Telehealth: Payer: Self-pay

## 2021-05-09 NOTE — Telephone Encounter (Signed)
Received records and billing statements request from [facility]. Sent to [employer] and [employer]

## 2021-05-11 ENCOUNTER — Encounter: Payer: Self-pay | Admitting: Nurse Practitioner

## 2021-05-17 ENCOUNTER — Encounter (INDEPENDENT_AMBULATORY_CARE_PROVIDER_SITE_OTHER): Payer: Medicare Other | Admitting: Ophthalmology

## 2021-05-23 ENCOUNTER — Encounter: Payer: Self-pay | Admitting: Nurse Practitioner

## 2021-05-25 ENCOUNTER — Encounter: Payer: Self-pay | Admitting: Nurse Practitioner

## 2021-05-25 ENCOUNTER — Ambulatory Visit (INDEPENDENT_AMBULATORY_CARE_PROVIDER_SITE_OTHER): Payer: Medicare Other | Admitting: Nurse Practitioner

## 2021-05-25 VITALS — BP 148/80 | HR 85 | Ht 65.0 in | Wt 326.0 lb

## 2021-05-25 DIAGNOSIS — E782 Mixed hyperlipidemia: Secondary | ICD-10-CM

## 2021-05-25 DIAGNOSIS — E1165 Type 2 diabetes mellitus with hyperglycemia: Secondary | ICD-10-CM

## 2021-05-25 DIAGNOSIS — R809 Proteinuria, unspecified: Secondary | ICD-10-CM | POA: Diagnosis not present

## 2021-05-25 DIAGNOSIS — I129 Hypertensive chronic kidney disease with stage 1 through stage 4 chronic kidney disease, or unspecified chronic kidney disease: Secondary | ICD-10-CM | POA: Diagnosis not present

## 2021-05-25 DIAGNOSIS — IMO0002 Reserved for concepts with insufficient information to code with codable children: Secondary | ICD-10-CM

## 2021-05-25 DIAGNOSIS — E1122 Type 2 diabetes mellitus with diabetic chronic kidney disease: Secondary | ICD-10-CM | POA: Diagnosis not present

## 2021-05-25 DIAGNOSIS — E559 Vitamin D deficiency, unspecified: Secondary | ICD-10-CM | POA: Diagnosis not present

## 2021-05-25 DIAGNOSIS — N183 Chronic kidney disease, stage 3 unspecified: Secondary | ICD-10-CM | POA: Diagnosis not present

## 2021-05-25 DIAGNOSIS — N189 Chronic kidney disease, unspecified: Secondary | ICD-10-CM | POA: Diagnosis not present

## 2021-05-25 DIAGNOSIS — E1129 Type 2 diabetes mellitus with other diabetic kidney complication: Secondary | ICD-10-CM | POA: Diagnosis not present

## 2021-05-25 DIAGNOSIS — I10 Essential (primary) hypertension: Secondary | ICD-10-CM

## 2021-05-25 LAB — POCT GLYCOSYLATED HEMOGLOBIN (HGB A1C): Hemoglobin A1C: 7.4 % — AB (ref 4.0–5.6)

## 2021-05-25 MED ORDER — HUMULIN 70/30 KWIKPEN (70-30) 100 UNIT/ML ~~LOC~~ SUPN: 44.0000 [IU] | PEN_INJECTOR | Freq: Two times a day (BID) | SUBCUTANEOUS | 3 refills | Status: DC

## 2021-05-25 MED ORDER — SEMAGLUTIDE (1 MG/DOSE) 4 MG/3ML ~~LOC~~ SOPN: 1.0000 mg | PEN_INJECTOR | SUBCUTANEOUS | 3 refills | Status: DC
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
## 2021-05-25 NOTE — Patient Instructions (Signed)

## 2021-05-31 ENCOUNTER — Telehealth: Payer: Self-pay

## 2021-05-31 NOTE — Telephone Encounter (Signed)
Patient left a VM asking for something to be took in place of ozempic. She can not afford the ozempic.

## 2021-06-01 DIAGNOSIS — N189 Chronic kidney disease, unspecified: Secondary | ICD-10-CM | POA: Diagnosis not present

## 2021-06-01 DIAGNOSIS — E1129 Type 2 diabetes mellitus with other diabetic kidney complication: Secondary | ICD-10-CM | POA: Diagnosis not present

## 2021-06-01 DIAGNOSIS — E211 Secondary hyperparathyroidism, not elsewhere classified: Secondary | ICD-10-CM | POA: Diagnosis not present

## 2021-06-01 DIAGNOSIS — I5033 Acute on chronic diastolic (congestive) heart failure: Secondary | ICD-10-CM | POA: Diagnosis not present

## 2021-06-01 DIAGNOSIS — I129 Hypertensive chronic kidney disease with stage 1 through stage 4 chronic kidney disease, or unspecified chronic kidney disease: Secondary | ICD-10-CM | POA: Diagnosis not present

## 2021-06-01 DIAGNOSIS — E1122 Type 2 diabetes mellitus with diabetic chronic kidney disease: Secondary | ICD-10-CM | POA: Diagnosis not present

## 2021-06-01 DIAGNOSIS — R809 Proteinuria, unspecified: Secondary | ICD-10-CM | POA: Diagnosis not present

## 2021-06-01 NOTE — Telephone Encounter (Signed)
We can try Victoza 1.8 mg SQ daily if she is agreeable to injecting once a day.

## 2021-06-01 NOTE — Telephone Encounter (Signed)
Pt stated she has tried victoza before and was not successful in controlling her glucose. Stated she would try to continue the ozempic.

## 2021-06-01 NOTE — Telephone Encounter (Signed)
noted 

## 2021-06-03 DIAGNOSIS — M170 Bilateral primary osteoarthritis of knee: Secondary | ICD-10-CM | POA: Diagnosis not present

## 2021-06-07 ENCOUNTER — Telehealth: Payer: Self-pay

## 2021-06-07 MED ORDER — FREESTYLE LIBRE 2 SENSOR MISC: 1.0000 | 3 refills | Status: DC

## 2021-06-07 NOTE — Telephone Encounter (Signed)
Pt requesting a RX for the [employer]. Patient uses [facility]

## 2021-06-08 ENCOUNTER — Encounter: Payer: Self-pay | Admitting: Nurse Practitioner

## 2021-06-08 ENCOUNTER — Other Ambulatory Visit: Payer: Self-pay

## 2021-06-08 DIAGNOSIS — IMO0002 Reserved for concepts with insufficient information to code with codable children: Secondary | ICD-10-CM

## 2021-06-08 DIAGNOSIS — N183 Chronic kidney disease, stage 3 unspecified: Secondary | ICD-10-CM

## 2021-06-08 MED ORDER — FREESTYLE LIBRE 2 SENSOR MISC: 1.0000 | 3 refills | Status: DC

## 2021-06-09 ENCOUNTER — Other Ambulatory Visit: Payer: Self-pay | Admitting: Family Medicine

## 2021-06-28 ENCOUNTER — Other Ambulatory Visit: Payer: Self-pay | Admitting: Internal Medicine

## 2021-06-28 DIAGNOSIS — E876 Hypokalemia: Secondary | ICD-10-CM

## 2021-07-21 ENCOUNTER — Other Ambulatory Visit: Payer: Self-pay | Admitting: Nurse Practitioner

## 2021-07-25 DIAGNOSIS — U071 COVID-19: Secondary | ICD-10-CM | POA: Diagnosis not present

## 2021-07-25 DIAGNOSIS — Z20822 Contact with and (suspected) exposure to covid-19: Secondary | ICD-10-CM | POA: Diagnosis not present

## 2021-07-26 ENCOUNTER — Other Ambulatory Visit: Payer: Self-pay

## 2021-07-26 ENCOUNTER — Telehealth: Payer: Self-pay | Admitting: Nurse Practitioner

## 2021-07-26 ENCOUNTER — Encounter: Payer: Self-pay | Admitting: Family Medicine

## 2021-07-26 ENCOUNTER — Encounter: Payer: Self-pay | Admitting: Internal Medicine

## 2021-07-26 ENCOUNTER — Ambulatory Visit (INDEPENDENT_AMBULATORY_CARE_PROVIDER_SITE_OTHER): Payer: Medicare Other | Admitting: Internal Medicine

## 2021-07-26 DIAGNOSIS — U071 COVID-19: Secondary | ICD-10-CM

## 2021-07-26 MED ORDER — NIRMATRELVIR/RITONAVIR (PAXLOVID) TABLET (RENAL DOSING): 2.0000 | ORAL_TABLET | Freq: Two times a day (BID) | ORAL | 0 refills | Status: AC

## 2021-07-26 NOTE — Telephone Encounter (Signed)
Informed patient her patient assistance Novolin and Ozempic is ready for pick up. Pt states husband will be picking up today
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
## 2021-07-26 NOTE — Telephone Encounter (Signed)
Pt is calling and states she received her Novolin from patient assistance and it is the vials, she said she does not take the vials. The form was filled out for vials so patient is not sure what she is needing to do. She called the company but they informed her they send what is filled out. Please advise. Pt does not have testing supplies needed for the vial and prefers the pens.

## 2021-07-27 ENCOUNTER — Encounter: Payer: Self-pay | Admitting: Nurse Practitioner

## 2021-07-27 NOTE — Telephone Encounter (Signed)
Per Caryl Asp, the pt assistance form was re faxed for the pen.

## 2021-07-27 NOTE — Telephone Encounter (Signed)
Refaxed patient assistance forms to [employer] for the novolog 70/30 flexpen and pen needles. Pt made aware.

## 2021-07-28 NOTE — Telephone Encounter (Signed)
Pt called this morning in regards to this, informed patient I do not believe we are able to ship that back as we would have to go to a drop off store and provide everything the medication has to be sent back in. Informed patient we may be able to discard of the medication here. Can you find more info on this?

## 2021-08-04 ENCOUNTER — Other Ambulatory Visit: Payer: Self-pay | Admitting: Nurse Practitioner

## 2021-08-11 ENCOUNTER — Encounter: Payer: Self-pay | Admitting: Nurse Practitioner

## 2021-08-11 ENCOUNTER — Other Ambulatory Visit: Payer: Self-pay | Admitting: Internal Medicine

## 2021-08-11 DIAGNOSIS — E876 Hypokalemia: Secondary | ICD-10-CM

## 2021-08-16 ENCOUNTER — Ambulatory Visit (INDEPENDENT_AMBULATORY_CARE_PROVIDER_SITE_OTHER): Payer: Medicare Other | Admitting: Family Medicine

## 2021-08-16 ENCOUNTER — Encounter: Payer: Self-pay | Admitting: Family Medicine

## 2021-08-16 ENCOUNTER — Other Ambulatory Visit: Payer: Self-pay

## 2021-08-16 VITALS — BP 130/66 | HR 85 | Resp 20 | Ht 65.0 in | Wt 335.0 lb

## 2021-08-16 DIAGNOSIS — E876 Hypokalemia: Secondary | ICD-10-CM

## 2021-08-16 DIAGNOSIS — E1122 Type 2 diabetes mellitus with diabetic chronic kidney disease: Secondary | ICD-10-CM | POA: Diagnosis not present

## 2021-08-16 DIAGNOSIS — E785 Hyperlipidemia, unspecified: Secondary | ICD-10-CM

## 2021-08-16 DIAGNOSIS — Z23 Encounter for immunization: Secondary | ICD-10-CM

## 2021-08-16 DIAGNOSIS — J302 Other seasonal allergic rhinitis: Secondary | ICD-10-CM

## 2021-08-16 DIAGNOSIS — I110 Hypertensive heart disease with heart failure: Secondary | ICD-10-CM

## 2021-08-16 DIAGNOSIS — N1832 Chronic kidney disease, stage 3b: Secondary | ICD-10-CM | POA: Diagnosis not present

## 2021-08-16 DIAGNOSIS — N183 Chronic kidney disease, stage 3 unspecified: Secondary | ICD-10-CM

## 2021-08-16 DIAGNOSIS — M170 Bilateral primary osteoarthritis of knee: Secondary | ICD-10-CM

## 2021-08-16 DIAGNOSIS — Z794 Long term (current) use of insulin: Secondary | ICD-10-CM

## 2021-08-16 DIAGNOSIS — E559 Vitamin D deficiency, unspecified: Secondary | ICD-10-CM

## 2021-08-16 MED ORDER — PROMETHAZINE-DM 6.25-15 MG/5ML PO SYRP: ORAL_SOLUTION | ORAL | 0 refills | Status: DC

## 2021-08-16 MED ORDER — POTASSIUM CHLORIDE ER 10 MEQ PO TBCR: EXTENDED_RELEASE_TABLET | ORAL | 4 refills | Status: DC

## 2021-08-16 MED ORDER — CHLORPHENIRAMINE MALEATE 4 MG PO TABS: ORAL_TABLET | ORAL | 0 refills | Status: DC

## 2021-08-16 MED ORDER — PREDNISONE 5 MG PO TABS: 5.0000 mg | ORAL_TABLET | Freq: Two times a day (BID) | ORAL | 0 refills | Status: AC

## 2021-08-16 NOTE — Patient Instructions (Addendum)
Annual exam in office with MD in November or December, call if you need me sooner    Foot exam today is good overall you do have reduced vibration sense in 1 foot.  It is important that you continue to examine your feet daily.  1 single multivitamin once daily is adequate taken many different types of over-the-counter supplements is not recommended.  Flu vaccine today.  Fasting lipids CMP and EGFR CBC and vitamin D in the next week.  Please try to commit to using your cholesterol medicine 3 times weekly basis offering some protection against heart disease and does have benefit over not taking it at all.  For uncontrolled allergies please commit to using your nasal spray Nasonex that you already have as well as the montelukast tablets 1 every day that you already have.  In addition I have prescribed a short course of prednisone for 5 days and a cough suppressant syrup Phenergan DM to be used at bedtime.also decongestant chlorpheniramine  It is important that you exercise regularly at least 30 minutes 5 times a week. If you develop chest pain, have severe difficulty breathing, or feel very tired, stop exercising immediately and seek medical attention  Thanks for choosing [facility], we consider it a privelige to serve you.

## 2021-08-16 NOTE — Progress Notes (Signed)
Kendra Hanson     MRN: [id number]      DOB: 20-May-1961   HPI Kendra Hanson is here for follow up and re-evaluation of chronic medical conditions, medication management and review of any available recent lab and radiology data.  Preventive health is updated, specifically  Cancer screening and Immunization.   Questions or concerns regarding consultations or procedures which the PT has had in the interim are  addressed. The PT denies any adverse reactions to current medications since the last visit.  C/o fatigue an cough, had covid infection in 07/2021   ROS Denies recent fever or chills. Denies sinus pressure, nasal congestion, ear pain or sore throat. Denies chest congestion, productive cough or wheezing. Denies chest pains, palpitations and leg swelling Denies abdominal pain, nausea, vomiting,diarrhea or constipation.   Denies dysuria, frequency, hesitancy or incontinence. C/o  joint pain, swelling and limitation in mobility. Denies headaches, seizures, numbness, or tingling. Denies depression, anxiety or insomnia. Denies skin break down or rash.   PE BP 130/66   Pulse 85   Resp 20   Ht 5\' 5"  (1.651 m)   Wt (!) 335 lb (152 kg)   SpO2 95%   BMI 55.75 kg/m    Patient alert and oriented and in no cardiopulmonary distress.  HEENT: No facial asymmetry, EOMI,     Neck supple .  Chest: Clear to auscultation bilaterally.  CVS: S1, S2 no murmurs, no S3.Regular rate.  ABD: Soft non tender.   Ext: No edema  MS: Adequate though reduced  ROM spine, shoulders, hips and knees.  Skin: Intact, no ulcerations or rash noted.  Psych: Good eye contact, normal affect. Memory intact not anxious or depressed appearing.  CNS: CN 2-12 intact, power,  normal throughout.no focal deficits noted.   Assessment & Plan  Hyperlipidemia LDL goal <100 Hyperlipidemia:Low fat diet discussed and encouraged.   Lipid Panel  Lab Results  Component Value Date   CHOL 199 11/17/2020   HDL 61  11/17/2020   LDLCALC 120 (H) 11/17/2020   TRIG 101 11/17/2020   CHOLHDL 3.3 11/17/2020     Updated lab needed at/ before next visit. Not at [facility] when last checked  Hypertensive heart disease with heart failure (Kendra Hanson) DASH diet and commitment to daily physical activity for a minimum of 30 minutes discussed and encouraged, as a part of hypertension management. The importance of attaining a healthy weight is also discussed.  BP/Weight 08/16/2021 05/25/2021 04/12/2021 03/22/2021 03/08/2021 02/22/2021 3/87/5643  Systolic BP 329 518 841 660 630 160 109  Diastolic BP 66 80 79 75 92 75 88  Wt. (Lbs) 335 326 360 - 317.4 316 315  BMI 55.75 54.25 59.91 - 52.82 52.59 52.42     Controlled, no change in medication   Morbid obesity (Kendra Hanson)  Patient re-educated about  the importance of commitment to a  minimum of 150 minutes of exercise per week as able.  The importance of healthy food choices with portion control discussed, as well as eating regularly and within a 12 hour window most days. The need to choose "clean , green" food 50 to 75% of the time is discussed, as well as to make water the primary drink and set a goal of 64 ounces water daily.    Weight /BMI 08/16/2021 05/25/2021 04/12/2021  WEIGHT 335 lb 326 lb 360 lb  HEIGHT 5\' 5"  5\' 5"  5\' 5"   BMI 55.75 kg/m2 54.25 kg/m2 59.91 kg/m2      Osteoarthritis of both knees  No acute pain flare, encouraged weight loss to reduce stress on the joints  Seasonal allergies Increased nasal congestion and clear drainage, encouraged daily use of medication  Stage 3b chronic kidney disease (Kendra Hanson) Followed closely by Nephrology  Type 2 diabetes mellitus (Kendra Hanson) Managed by Endo [name] is reminded of the importance of commitment to daily physical activity for 30 minutes or more, as able and the need to limit carbohydrate intake to 30 to 60 grams per meal to help with blood sugar control.   The need to take medication as prescribed, test blood sugar as  directed, and to call between visits if there is a concern that blood sugar is uncontrolled is also discussed.   Kendra Hanson is reminded of the importance of daily foot exam, annual eye examination, and good blood sugar, blood pressure and cholesterol control.  Diabetic Labs Latest Ref Rng & Units 05/25/2021 02/22/2021 11/17/2020 07/22/2020 03/19/2020  HbA1c 4.0 - 5.6 % 7.4(A) 8.4(A) 8.8(H) 7.6(A) 7.8(A)  Microalbumin mg/dL - - - - -  Micro/Creat Ratio <30 mcg/mg creat - - - - -  Chol 100 - 199 mg/dL - - 199 - -  HDL >39 mg/dL - - 61 - -  Calc LDL 0 - 99 mg/dL - - 120(H) - -  Triglycerides 0 - 149 mg/dL - - 101 - -  Creatinine 0.57 - 1.00 mg/dL - - 1.23(H) - -   BP/Weight 08/16/2021 05/25/2021 04/12/2021 03/22/2021 03/08/2021 02/22/2021 1/60/7371  Systolic BP 062 694 854 627 035 009 381  Diastolic BP 66 80 79 75 92 75 88  Wt. (Lbs) 335 326 360 - 317.4 316 315  BMI 55.75 54.25 59.91 - 52.82 52.59 52.42   Foot/eye exam completion dates Latest Ref Rng & Units 08/16/2021 12/29/2020  Eye Exam No Retinopathy - Retinopathy(A)  Foot Form Completion - Done -

## 2021-08-17 ENCOUNTER — Encounter: Payer: Self-pay | Admitting: Family Medicine

## 2021-08-17 NOTE — Assessment & Plan Note (Signed)
DASH diet and commitment to daily physical activity for a minimum of 30 minutes discussed and encouraged, as a part of hypertension management. The importance of attaining a healthy weight is also discussed.  BP/Weight 08/16/2021 05/25/2021 04/12/2021 03/22/2021 03/08/2021 02/22/2021 2/40/0180  Systolic BP 970 449 252 415 901 724 195  Diastolic BP 66 80 79 75 92 75 88  Wt. (Lbs) 335 326 360 - 317.4 316 315  BMI 55.75 54.25 59.91 - 52.82 52.59 52.42     Controlled, no change in medication

## 2021-08-17 NOTE — Assessment & Plan Note (Signed)
Managed by Endo Ms. Piazza is reminded of the importance of commitment to daily physical activity for 30 minutes or more, as able and the need to limit carbohydrate intake to 30 to 60 grams per meal to help with blood sugar control.   The need to take medication as prescribed, test blood sugar as directed, and to call between visits if there is a concern that blood sugar is uncontrolled is also discussed.   Ms. Grahn is reminded of the importance of daily foot exam, annual eye examination, and good blood sugar, blood pressure and cholesterol control.  Diabetic Labs Latest Ref Rng & Units 05/25/2021 02/22/2021 11/17/2020 07/22/2020 03/19/2020  HbA1c 4.0 - 5.6 % 7.4(A) 8.4(A) 8.8(H) 7.6(A) 7.8(A)  Microalbumin mg/dL - - - - -  Micro/Creat Ratio <30 mcg/mg creat - - - - -  Chol 100 - 199 mg/dL - - 199 - -  HDL >39 mg/dL - - 61 - -  Calc LDL 0 - 99 mg/dL - - 120(H) - -  Triglycerides 0 - 149 mg/dL - - 101 - -  Creatinine 0.57 - 1.00 mg/dL - - 1.23(H) - -   BP/Weight 08/16/2021 05/25/2021 04/12/2021 03/22/2021 03/08/2021 02/22/2021 2/62/0355  Systolic BP 974 163 845 364 680 321 224  Diastolic BP 66 80 79 75 92 75 88  Wt. (Lbs) 335 326 360 - 317.4 316 315  BMI 55.75 54.25 59.91 - 52.82 52.59 52.42   Foot/eye exam completion dates Latest Ref Rng & Units 08/16/2021 12/29/2020  Eye Exam No Retinopathy - Retinopathy(A)  Foot Form Completion - Done -

## 2021-08-17 NOTE — Assessment & Plan Note (Signed)
Hyperlipidemia:Low fat diet discussed and encouraged.   Lipid Panel  Lab Results  Component Value Date   CHOL 199 11/17/2020   HDL 61 11/17/2020   LDLCALC 120 (H) 11/17/2020   TRIG 101 11/17/2020   CHOLHDL 3.3 11/17/2020     Updated lab needed at/ before next visit. Not at [facility] when last checked

## 2021-08-17 NOTE — Assessment & Plan Note (Signed)
Followed closely by Nephrology

## 2021-08-17 NOTE — Assessment & Plan Note (Signed)
  Patient re-educated about  the importance of commitment to a  minimum of 150 minutes of exercise per week as able.  The importance of healthy food choices with portion control discussed, as well as eating regularly and within a 12 hour window most days. The need to choose "clean , green" food 50 to 75% of the time is discussed, as well as to make water the primary drink and set a goal of 64 ounces water daily.    Weight /BMI 08/16/2021 05/25/2021 04/12/2021  WEIGHT 335 lb 326 lb 360 lb  HEIGHT 5\' 5"  5\' 5"  5\' 5"   BMI 55.75 kg/m2 54.25 kg/m2 59.91 kg/m2

## 2021-08-17 NOTE — Assessment & Plan Note (Signed)
No acute pain flare, encouraged weight loss to reduce stress on the joints

## 2021-08-17 NOTE — Assessment & Plan Note (Signed)
Increased nasal congestion and clear drainage, encouraged daily use of medication

## 2021-08-19 ENCOUNTER — Other Ambulatory Visit: Payer: Self-pay | Admitting: Nurse Practitioner

## 2021-08-22 DIAGNOSIS — E785 Hyperlipidemia, unspecified: Secondary | ICD-10-CM | POA: Diagnosis not present

## 2021-08-22 DIAGNOSIS — Z794 Long term (current) use of insulin: Secondary | ICD-10-CM | POA: Diagnosis not present

## 2021-08-22 DIAGNOSIS — N183 Chronic kidney disease, stage 3 unspecified: Secondary | ICD-10-CM | POA: Diagnosis not present

## 2021-08-22 DIAGNOSIS — E1122 Type 2 diabetes mellitus with diabetic chronic kidney disease: Secondary | ICD-10-CM | POA: Diagnosis not present

## 2021-08-22 DIAGNOSIS — E559 Vitamin D deficiency, unspecified: Secondary | ICD-10-CM | POA: Diagnosis not present

## 2021-08-23 ENCOUNTER — Encounter: Payer: Self-pay | Admitting: Family Medicine

## 2021-08-23 LAB — CMP14+EGFR
ALT: 9 IU/L (ref 0–32)
AST: 13 IU/L (ref 0–40)
Albumin/Globulin Ratio: 1.4 (ref 1.2–2.2)
Albumin: 4.1 g/dL (ref 3.8–4.8)
Alkaline Phosphatase: 136 IU/L — ABNORMAL HIGH (ref 44–121)
BUN/Creatinine Ratio: 19 (ref 12–28)
BUN: 30 mg/dL — ABNORMAL HIGH (ref 8–27)
Bilirubin Total: 0.3 mg/dL (ref 0.0–1.2)
CO2: 26 mmol/L (ref 20–29)
Calcium: 9.4 mg/dL (ref 8.7–10.3)
Chloride: 97 mmol/L (ref 96–106)
Creatinine, Ser: 1.59 mg/dL — ABNORMAL HIGH (ref 0.57–1.00)
Globulin, Total: 2.9 g/dL (ref 1.5–4.5)
Glucose: 158 mg/dL — ABNORMAL HIGH (ref 70–99)
Potassium: 4.6 mmol/L (ref 3.5–5.2)
Sodium: 142 mmol/L (ref 134–144)
Total Protein: 7 g/dL (ref 6.0–8.5)
eGFR: 37 mL/min/{1.73_m2} — ABNORMAL LOW (ref >59)

## 2021-08-23 LAB — LIPID PANEL
Chol/HDL Ratio: 3.3 ratio (ref 0.0–4.4)
Cholesterol, Total: 234 mg/dL — ABNORMAL HIGH (ref 100–199)
HDL: 72 mg/dL (ref >39)
LDL Chol Calc (NIH): 149 mg/dL — ABNORMAL HIGH (ref 0–99)
Triglycerides: 78 mg/dL (ref 0–149)
VLDL Cholesterol Cal: 13 mg/dL (ref 5–40)

## 2021-08-23 LAB — CBC
Hematocrit: 37.6 % (ref 34.0–46.6)
Hemoglobin: 12.6 g/dL (ref 11.1–15.9)
MCH: 29.2 pg (ref 26.6–33.0)
MCHC: 33.5 g/dL (ref 31.5–35.7)
MCV: 87 fL (ref 79–97)
Platelets: 261 10*3/uL (ref 150–450)
RBC: 4.32 x10E6/uL (ref 3.77–5.28)
RDW: 13.2 % (ref 11.7–15.4)
WBC: 12.1 10*3/uL — ABNORMAL HIGH (ref 3.4–10.8)

## 2021-08-23 LAB — VITAMIN D 25 HYDROXY (VIT D DEFICIENCY, FRACTURES): Vit D, 25-Hydroxy: 23.3 ng/mL — ABNORMAL LOW (ref 30.0–100.0)

## 2021-08-24 ENCOUNTER — Other Ambulatory Visit: Payer: Self-pay

## 2021-08-24 ENCOUNTER — Other Ambulatory Visit: Payer: Self-pay | Admitting: Family Medicine

## 2021-08-24 DIAGNOSIS — E785 Hyperlipidemia, unspecified: Secondary | ICD-10-CM

## 2021-08-24 DIAGNOSIS — D72829 Elevated white blood cell count, unspecified: Secondary | ICD-10-CM

## 2021-08-24 DIAGNOSIS — I10 Essential (primary) hypertension: Secondary | ICD-10-CM

## 2021-08-24 MED ORDER — ATORVASTATIN CALCIUM 10 MG PO TABS: 10.0000 mg | ORAL_TABLET | Freq: Every day | ORAL | 5 refills | Status: DC

## 2021-08-25 ENCOUNTER — Ambulatory Visit: Payer: Medicare Other | Admitting: Nurse Practitioner

## 2021-08-26 NOTE — Patient Instructions (Signed)
Advice for Weight Management  -For most of us the best way to lose weight is by diet management. Generally speaking, diet management means consuming less calories intentionally which over time brings about progressive weight loss.  This can be achieved more effectively by restricting carbohydrate consumption to the minimum possible.  So, it is critically important to know your numbers: how much calorie you are consuming and how much calorie you need. More importantly, our carbohydrates sources should be unprocessed or minimally processed complex starch food items.   Sometimes, it is important to balance nutrition by increasing protein intake (animal or plant source), fruits, and vegetables.  -Sticking to a routine mealtime to eat 3 meals a day and avoiding unnecessary snacks is shown to have a big role in weight control. Under normal circumstances, the only time we lose real weight is when we are hungry, so allow hunger to take place- hunger means no food between meal times, only water.  It is not advisable to starve.   -It is better to avoid simple carbohydrates including: Cakes, Sweet Desserts, Ice Cream, Soda (diet and regular), Sweet Tea, Candies, Chips, Cookies, Store Bought Juices, Alcohol in Excess of  1-2 drinks a day, Artificial Sweeteners, Doughnuts, Coffee Creamers, "Sugar-free" Products, etc, etc.  This is not a complete list.....    -Consulting with certified diabetes educators is proven to provide you with the most accurate and current information on diet.  Also, you may be  interested in discussing diet options/exchanges , we can schedule a visit with Kendra Hanson, RDN, CDE for individualized nutrition education.  -Exercise: If you are able: 30 -60 minutes a day ,4 days a week, or 150 minutes a week.  The longer the better.  Combine stretch, strength, and aerobic activities.  If you were told in the past that you have high risk for cardiovascular diseases, you may seek evaluation by  your heart doctor prior to initiating moderate to intense exercise programs.    

## 2021-08-29 ENCOUNTER — Ambulatory Visit (INDEPENDENT_AMBULATORY_CARE_PROVIDER_SITE_OTHER): Payer: Medicare Other | Admitting: Nurse Practitioner

## 2021-08-29 ENCOUNTER — Encounter: Payer: Self-pay | Admitting: Nurse Practitioner

## 2021-08-29 ENCOUNTER — Other Ambulatory Visit: Payer: Self-pay

## 2021-08-29 VITALS — BP 122/67 | HR 86 | Ht 65.0 in | Wt 330.6 lb

## 2021-08-29 DIAGNOSIS — E782 Mixed hyperlipidemia: Secondary | ICD-10-CM | POA: Diagnosis not present

## 2021-08-29 DIAGNOSIS — Z794 Long term (current) use of insulin: Secondary | ICD-10-CM

## 2021-08-29 DIAGNOSIS — E1122 Type 2 diabetes mellitus with diabetic chronic kidney disease: Secondary | ICD-10-CM | POA: Diagnosis not present

## 2021-08-29 DIAGNOSIS — N1832 Chronic kidney disease, stage 3b: Secondary | ICD-10-CM

## 2021-08-29 DIAGNOSIS — I10 Essential (primary) hypertension: Secondary | ICD-10-CM

## 2021-08-29 DIAGNOSIS — E559 Vitamin D deficiency, unspecified: Secondary | ICD-10-CM

## 2021-08-29 LAB — POCT GLYCOSYLATED HEMOGLOBIN (HGB A1C): HbA1c, POC (controlled diabetic range): 8.5 % — AB (ref 0.0–7.0)

## 2021-08-29 MED ORDER — HUMULIN 70/30 KWIKPEN (70-30) 100 UNIT/ML ~~LOC~~ SUPN: 40.0000 [IU] | PEN_INJECTOR | Freq: Two times a day (BID) | SUBCUTANEOUS | 0 refills | Status: DC
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
## 2021-09-01 DIAGNOSIS — I129 Hypertensive chronic kidney disease with stage 1 through stage 4 chronic kidney disease, or unspecified chronic kidney disease: Secondary | ICD-10-CM | POA: Diagnosis not present

## 2021-09-01 DIAGNOSIS — E1129 Type 2 diabetes mellitus with other diabetic kidney complication: Secondary | ICD-10-CM | POA: Diagnosis not present

## 2021-09-01 DIAGNOSIS — E1122 Type 2 diabetes mellitus with diabetic chronic kidney disease: Secondary | ICD-10-CM | POA: Diagnosis not present

## 2021-09-01 DIAGNOSIS — N189 Chronic kidney disease, unspecified: Secondary | ICD-10-CM | POA: Diagnosis not present

## 2021-09-01 DIAGNOSIS — R809 Proteinuria, unspecified: Secondary | ICD-10-CM | POA: Diagnosis not present

## 2021-09-06 ENCOUNTER — Telehealth: Payer: Self-pay | Admitting: Cardiology

## 2021-09-06 NOTE — Telephone Encounter (Signed)
Patient c/o Palpitations:  High priority if patient c/o lightheadedness, shortness of breath, or chest pain  How long have you had palpitations/irregular HR/ Afib? Are you having the symptoms now? yes  Are you currently experiencing lightheadedness, SOB or CP? no  Do you have a history of afib (atrial fibrillation) or irregular heart rhythm? yes  Have you checked your BP or HR? (document readings if available): not this morning, but the patient will take it soon  Are you experiencing any other symptoms? Tired, gets winded easily after walking. Patient just does not feel right. She hasn't felt right since she had COVID

## 2021-09-06 NOTE — Telephone Encounter (Signed)
I spoke with patient and offered her an appointment next week with Dr.O'Neal on 09/14/21 at 9:20 am. She would like to be seen.

## 2021-09-06 NOTE — Telephone Encounter (Signed)
   Fatigue is very common after COVID but I am in agreement that she have follow-up labs to make sure nothing else is contributing. Ideally, she should not be on Meloxicam given her CKD so would try to limit use and review with Nephrology. If fatigue persists, we can consider obtaining a repeat echocardiogram. 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   Signed, Erma Heritage, PA-C 09/06/2021, 4:04 PM Pager: [phone]

## 2021-09-06 NOTE — Telephone Encounter (Signed)
Patient states since she had covid last month she has felt bad. She has felt more tired than usual. Has apt with nephrologist tomorrow, labs are ordered. She still takes diltiazem 360 mg qd. She has felt more palpitations than usual. Tries to watch caffeine. Her last to BP's were 137/94, HR 86, and 148/81, HR 97. Recently started on meloxicam for knee pain and she will discuss with nephrologist tomorrow.     I will FYI B.Stader, PA-C

## 2021-09-07 DIAGNOSIS — I5032 Chronic diastolic (congestive) heart failure: Secondary | ICD-10-CM | POA: Diagnosis not present

## 2021-09-07 DIAGNOSIS — N189 Chronic kidney disease, unspecified: Secondary | ICD-10-CM | POA: Diagnosis not present

## 2021-09-07 DIAGNOSIS — E1129 Type 2 diabetes mellitus with other diabetic kidney complication: Secondary | ICD-10-CM | POA: Diagnosis not present

## 2021-09-07 DIAGNOSIS — I129 Hypertensive chronic kidney disease with stage 1 through stage 4 chronic kidney disease, or unspecified chronic kidney disease: Secondary | ICD-10-CM | POA: Diagnosis not present

## 2021-09-07 DIAGNOSIS — R809 Proteinuria, unspecified: Secondary | ICD-10-CM | POA: Diagnosis not present

## 2021-09-07 DIAGNOSIS — E1122 Type 2 diabetes mellitus with diabetic chronic kidney disease: Secondary | ICD-10-CM | POA: Diagnosis not present

## 2021-09-07 DIAGNOSIS — E211 Secondary hyperparathyroidism, not elsewhere classified: Secondary | ICD-10-CM | POA: Diagnosis not present
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
## 2021-09-14 ENCOUNTER — Ambulatory Visit (INDEPENDENT_AMBULATORY_CARE_PROVIDER_SITE_OTHER): Payer: Medicare Other | Admitting: Cardiovascular Disease

## 2021-09-14 ENCOUNTER — Other Ambulatory Visit (HOSPITAL_COMMUNITY)
Admission: RE | Admit: 2021-09-14 | Discharge: 2021-09-14 | Disposition: A | Discharge status: Home / Self Care | Payer: Medicare Other | Source: Ambulatory Visit | Attending: Cardiovascular Disease | Admitting: Cardiovascular Disease

## 2021-09-14 ENCOUNTER — Other Ambulatory Visit: Payer: Self-pay

## 2021-09-14 ENCOUNTER — Encounter: Payer: Self-pay | Admitting: Cardiovascular Disease

## 2021-09-14 VITALS — BP 140/70 | HR 68 | Ht 65.0 in | Wt 328.0 lb

## 2021-09-14 DIAGNOSIS — E782 Mixed hyperlipidemia: Secondary | ICD-10-CM

## 2021-09-14 DIAGNOSIS — R002 Palpitations: Secondary | ICD-10-CM | POA: Insufficient documentation

## 2021-09-14 DIAGNOSIS — I10 Essential (primary) hypertension: Secondary | ICD-10-CM | POA: Diagnosis not present

## 2021-09-14 DIAGNOSIS — R0602 Shortness of breath: Secondary | ICD-10-CM

## 2021-09-14 DIAGNOSIS — I5032 Chronic diastolic (congestive) heart failure: Secondary | ICD-10-CM | POA: Diagnosis not present

## 2021-09-14 LAB — TSH: TSH: 1.84 u[IU]/mL (ref 0.350–4.500)

## 2021-09-14 MED ORDER — EZETIMIBE 10 MG PO TABS: 10.0000 mg | ORAL_TABLET | Freq: Every day | ORAL | 3 refills | Status: DC

## 2021-09-14 NOTE — Patient Instructions (Signed)
Medication Instructions:  Your physician has recommended you make the following change in your medication:  STOP LIPITOR START ZETIA 10 mg tablets daily  *If you need a refill on your cardiac medications before your next appointment, please call your pharmacy*   Lab Work: TSH If you have labs (blood work) drawn today and your tests are completely normal, you will receive your results only by: [place] (if you have MyChart) OR A paper copy in the mail If you have any lab test that is abnormal or we need to change your treatment, we will call you to review the results.   Testing/Procedures: Your physician has requested that you have an echocardiogram. Echocardiography is a painless test that uses sound waves to create images of your heart. It provides your doctor with information about the size and shape of your heart and how well your heart's chambers and valves are working. This procedure takes approximately one hour. There are no restrictions for this procedure.    Follow-Up: At [facility], you and your health needs are our priority.  As part of our continuing mission to provide you with exceptional heart care, we have created designated Provider Care Teams.  These Care Teams include your primary Cardiologist (physician) and Advanced Practice Providers (APPs -  Physician Assistants and Nurse Practitioners) who all work together to provide you with the care you need, when you need it.  We recommend signing up for the patient portal called "MyChart".  Sign up information is provided on this After Visit Summary.  MyChart is used to connect with patients for Virtual Visits (Telemedicine).  Patients are able to view lab/test results, encounter notes, upcoming appointments, etc.  Non-urgent messages can be sent to your provider as well.   To learn more about what you can do with MyChart, go to [email or web address].    Your next appointment:   6 month(s)  The format for your  next appointment:   In Person  Provider:   Eleonore Chiquito, MD   Other Instructions

## 2021-09-21 DIAGNOSIS — R809 Proteinuria, unspecified: Secondary | ICD-10-CM | POA: Diagnosis not present

## 2021-09-21 DIAGNOSIS — M170 Bilateral primary osteoarthritis of knee: Secondary | ICD-10-CM | POA: Diagnosis not present

## 2021-09-21 DIAGNOSIS — E1122 Type 2 diabetes mellitus with diabetic chronic kidney disease: Secondary | ICD-10-CM | POA: Diagnosis not present

## 2021-09-21 DIAGNOSIS — N189 Chronic kidney disease, unspecified: Secondary | ICD-10-CM | POA: Diagnosis not present

## 2021-09-21 DIAGNOSIS — E1129 Type 2 diabetes mellitus with other diabetic kidney complication: Secondary | ICD-10-CM | POA: Diagnosis not present

## 2021-09-21 DIAGNOSIS — I129 Hypertensive chronic kidney disease with stage 1 through stage 4 chronic kidney disease, or unspecified chronic kidney disease: Secondary | ICD-10-CM | POA: Diagnosis not present

## 2021-09-27 ENCOUNTER — Encounter: Payer: Self-pay | Admitting: Family Medicine

## 2021-09-27 ENCOUNTER — Other Ambulatory Visit: Payer: Self-pay | Admitting: Family Medicine

## 2021-09-27 ENCOUNTER — Other Ambulatory Visit: Payer: Self-pay | Admitting: *Deleted

## 2021-09-27 MED ORDER — PANTOPRAZOLE SODIUM 20 MG PO TBEC: 20.0000 mg | DELAYED_RELEASE_TABLET | Freq: Every day | ORAL | 0 refills | Status: DC

## 2021-10-17 ENCOUNTER — Ambulatory Visit (INDEPENDENT_AMBULATORY_CARE_PROVIDER_SITE_OTHER): Payer: Medicare Other

## 2021-10-17 ENCOUNTER — Other Ambulatory Visit: Payer: Self-pay

## 2021-10-17 DIAGNOSIS — E1122 Type 2 diabetes mellitus with diabetic chronic kidney disease: Secondary | ICD-10-CM | POA: Diagnosis not present

## 2021-10-17 DIAGNOSIS — Z794 Long term (current) use of insulin: Secondary | ICD-10-CM | POA: Diagnosis not present

## 2021-10-17 DIAGNOSIS — N183 Chronic kidney disease, stage 3 unspecified: Secondary | ICD-10-CM | POA: Diagnosis not present

## 2021-10-17 DIAGNOSIS — Z1231 Encounter for screening mammogram for malignant neoplasm of breast: Secondary | ICD-10-CM | POA: Diagnosis not present

## 2021-10-17 DIAGNOSIS — Z0000 Encounter for general adult medical examination without abnormal findings: Secondary | ICD-10-CM
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
## 2021-10-17 NOTE — Patient Instructions (Addendum)
Kendra Hanson , Thank you for taking time to come for your Medicare Wellness Visit. I appreciate your ongoing commitment to your health goals. Please review the following plan we discussed and let me know if I can assist you in the future.   These are the goals we discussed:  Goals       Patient Stated (pt-stated)      Referral entered to diabetic educator/nutritionist.      Patient Stated      Keep brain active by working crossword puzzles, games and brain teasers      Prevent falls        This is a list of the screening recommended for you and due dates:  Health Maintenance  Topic Date Due   Pneumococcal Vaccination (3 - PCV) 03/28/2017   COVID-19 Vaccine (4 - Booster for Moderna series) 11/11/2021*   Eye exam for diabetics  12/29/2021   Hemoglobin A1C  02/27/2022   Complete foot exam   08/17/2022   Mammogram  01/10/2023   Tetanus Vaccine  10/09/2023   Pap Smear  03/08/2024   Colon Cancer Screening  04/04/2025   Flu Shot  Completed   Hepatitis C Screening: USPSTF Recommendation to screen - Ages 18-79 yo.  Completed   HIV Screening  Completed   Zoster (Shingles) Vaccine  Completed   HPV Vaccine  Aged Out  *Topic was postponed. The date shown is not the original due date.     Health Maintenance, Female Adopting a healthy lifestyle and getting preventive care are important in promoting health and wellness. Ask your health care provider about: The right schedule for you to have regular tests and exams. Things you can do on your own to prevent diseases and keep yourself healthy. What should I know about diet, weight, and exercise? Eat a healthy diet  Eat a diet that includes plenty of vegetables, fruits, low-fat dairy products, and lean protein. Do not eat a lot of foods that are high in solid fats, added sugars, or sodium. Maintain a healthy weight Body mass index (BMI) is used to identify weight problems. It estimates body fat based on height and weight. Your health care  provider can help determine your BMI and help you achieve or maintain a healthy weight. Get regular exercise Get regular exercise. This is one of the most important things you can do for your health. Most adults should: Exercise for at least 150 minutes each week. The exercise should increase your heart rate and make you sweat (moderate-intensity exercise). Do strengthening exercises at least twice a week. This is in addition to the moderate-intensity exercise. Spend less time sitting. Even light physical activity can be beneficial. Watch cholesterol and blood lipids Have your blood tested for lipids and cholesterol at 61 years of age, then have this test every 5 years. Have your cholesterol levels checked more often if: Your lipid or cholesterol levels are high. You are older than 61 years of age. You are at high risk for heart disease. What should I know about cancer screening? Depending on your health history and family history, you may need to have cancer screening at various ages. This may include screening for: Breast cancer. Cervical cancer. Colorectal cancer. Skin cancer. Lung cancer. What should I know about heart disease, diabetes, and high blood pressure? Blood pressure and heart disease High blood pressure causes heart disease and increases the risk of stroke. This is more likely to develop in people who have high blood pressure readings or  are overweight. Have your blood pressure checked: Every 3-5 years if you are 12-43 years of age. Every year if you are 1 years old or older. Diabetes Have regular diabetes screenings. This checks your fasting blood sugar level. Have the screening done: Once every three years after age 54 if you are at a normal weight and have a low risk for diabetes. More often and at a younger age if you are overweight or have a high risk for diabetes. What should I know about preventing infection? Hepatitis B If you have a higher risk for hepatitis B,  you should be screened for this virus. Talk with your health care provider to find out if you are at risk for hepatitis B infection. Hepatitis C Testing is recommended for: Everyone born from [birth year] through 1966. Anyone with known risk factors for hepatitis C. Sexually transmitted infections (STIs) Get screened for STIs, including gonorrhea and chlamydia, if: You are sexually active and are younger than 61 years of age. You are older than 61 years of age and your health care provider tells you that you are at risk for this type of infection. Your sexual activity has changed since you were last screened, and you are at increased risk for chlamydia or gonorrhea. Ask your health care provider if you are at risk. Ask your health care provider about whether you are at high risk for HIV. Your health care provider may recommend a prescription medicine to help prevent HIV infection. If you choose to take medicine to prevent HIV, you should first get tested for HIV. You should then be tested every 3 months for as long as you are taking the medicine. Pregnancy If you are about to stop having your period (premenopausal) and you may become pregnant, seek counseling before you get pregnant. Take 400 to 800 micrograms (mcg) of folic acid every day if you become pregnant. Ask for birth control (contraception) if you want to prevent pregnancy. Osteoporosis and menopause Osteoporosis is a disease in which the bones lose minerals and strength with aging. This can result in bone fractures. If you are 18 years old or older, or if you are at risk for osteoporosis and fractures, ask your health care provider if you should: Be screened for bone loss. Take a calcium or vitamin D supplement to lower your risk of fractures. Be given hormone replacement therapy (HRT) to treat symptoms of menopause. Follow these instructions at home: Alcohol use Do not drink alcohol if: Your health care provider tells you not to  drink. You are pregnant, may be pregnant, or are planning to become pregnant. If you drink alcohol: Limit how much you have to: 0-1 drink a day. Know how much alcohol is in your drink. In the [place] one drink equals one 12 oz bottle of beer (355 mL), one 5 oz glass of wine (148 mL), or one 1 oz glass of hard liquor (44 mL). Lifestyle Do not use any products that contain nicotine or tobacco. These products include cigarettes, chewing tobacco, and vaping devices, such as e-cigarettes. If you need help quitting, ask your health care provider. Do not use street drugs. Do not share needles. Ask your health care provider for help if you need support or information about quitting drugs. General instructions Schedule regular health, dental, and eye exams. Stay current with your vaccines. Tell your health care provider if: You often feel depressed. You have ever been abused or do not feel safe at home. Summary Adopting a healthy lifestyle  and getting preventive care are important in promoting health and wellness. Follow your health care provider's instructions about healthy diet, exercising, and getting tested or screened for diseases. Follow your health care provider's instructions on monitoring your cholesterol and blood pressure. This information is not intended to replace advice given to you by your health care provider. Make sure you discuss any questions you have with your health care provider. Document Revised: 03/21/2021 Document Reviewed: 03/21/2021 Elsevier Patient Education  [place].

## 2021-10-19 ENCOUNTER — Ambulatory Visit: Payer: Medicare Other | Admitting: Podiatry

## 2021-10-19 ENCOUNTER — Ambulatory Visit (INDEPENDENT_AMBULATORY_CARE_PROVIDER_SITE_OTHER): Payer: Medicare Other

## 2021-10-19 ENCOUNTER — Ambulatory Visit: Payer: Medicare Other | Admitting: Nutrition

## 2021-10-19 ENCOUNTER — Encounter: Payer: Self-pay | Admitting: Podiatry

## 2021-10-19 ENCOUNTER — Telehealth: Payer: Self-pay | Admitting: Nutrition

## 2021-10-19 ENCOUNTER — Other Ambulatory Visit: Payer: Self-pay

## 2021-10-19 DIAGNOSIS — M79671 Pain in right foot: Secondary | ICD-10-CM

## 2021-10-19 DIAGNOSIS — E1140 Type 2 diabetes mellitus with diabetic neuropathy, unspecified: Secondary | ICD-10-CM | POA: Diagnosis not present

## 2021-10-19 DIAGNOSIS — L84 Corns and callosities: Secondary | ICD-10-CM

## 2021-10-19 DIAGNOSIS — M722 Plantar fascial fibromatosis: Secondary | ICD-10-CM | POA: Diagnosis not present

## 2021-10-19 DIAGNOSIS — E1149 Type 2 diabetes mellitus with other diabetic neurological complication: Secondary | ICD-10-CM | POA: Diagnosis not present

## 2021-10-19 MED ORDER — TRIAMCINOLONE ACETONIDE 10 MG/ML IJ SUSP
10.0000 mg | Freq: Once | INTRAMUSCULAR | Status: AC
  Administered 2021-10-19: 10 mg

## 2021-10-20 NOTE — Progress Notes (Signed)
Subjective:   [name], female   DOB: 60 y.o.   MRN: [id number]   HPI opening exam is below patient does have good neurological vascular status and was found to have exquisite discomfort plantar aspect right heel at the insertion and keratotic lesions plantar aspect right and left heel and lateral side right and left foot    ROS      Objective:  Physical Exam  Patient presents stating she is developed a lot of pain in the bottom of her right heel recently and she has lesions on both feet which gets sore with obesity not allowing her to reach the bottom of her feet and cut these.  She is a diabetic so is always concerned about these things     Assessment:  Acute plantar fasciitis right along with lesion formation bilateral     Plan:  H&P reviewed condition and at this point went ahead did sterile prep and injected the plantar fascial right 3 mg Kenalog 5 mg Xylocaine and debrided lesions bilateral no iatrogenic bleeding and reappoint for routine care  X-rays right indicated large spur no indications of stress fracture moderate arthritis noted

## 2021-10-27 ENCOUNTER — Ambulatory Visit (HOSPITAL_COMMUNITY)
Admission: RE | Admit: 2021-10-27 | Discharge: 2021-10-27 | Disposition: A | Discharge status: Home / Self Care | Payer: Medicare Other | Source: Ambulatory Visit | Attending: Cardiovascular Disease | Admitting: Cardiovascular Disease

## 2021-10-27 DIAGNOSIS — R0602 Shortness of breath: Secondary | ICD-10-CM | POA: Diagnosis not present

## 2021-10-27 LAB — ECHOCARDIOGRAM COMPLETE
Area-P 1/2: 3.99 cm2
S' Lateral: 3.4 cm

## 2021-10-27 NOTE — Progress Notes (Signed)
*  PRELIMINARY RESULTS* Echocardiogram 2D Echocardiogram has been performed. Patient refused Definity at this time, even after reassuring her several times that using it would make for a better exam.   Samuel Germany 10/27/2021, 10:16 AM

## 2021-11-02 ENCOUNTER — Telehealth: Payer: Self-pay

## 2021-11-02 DIAGNOSIS — E1122 Type 2 diabetes mellitus with diabetic chronic kidney disease: Secondary | ICD-10-CM | POA: Diagnosis not present

## 2021-11-02 DIAGNOSIS — Z794 Long term (current) use of insulin: Secondary | ICD-10-CM | POA: Diagnosis not present

## 2021-11-02 NOTE — Telephone Encounter (Signed)
Disability Forms  Copied  Noted sleeved

## 2021-11-10 ENCOUNTER — Encounter: Payer: Self-pay | Admitting: Family Medicine

## 2021-11-11 ENCOUNTER — Ambulatory Visit (INDEPENDENT_AMBULATORY_CARE_PROVIDER_SITE_OTHER): Payer: Medicare Other | Admitting: Nurse Practitioner

## 2021-11-11 ENCOUNTER — Encounter: Payer: Self-pay | Admitting: Nurse Practitioner

## 2021-11-11 ENCOUNTER — Other Ambulatory Visit: Payer: Self-pay

## 2021-11-11 DIAGNOSIS — J069 Acute upper respiratory infection, unspecified: Secondary | ICD-10-CM | POA: Diagnosis not present

## 2021-11-11 MED ORDER — PROMETHAZINE-DM 6.25-15 MG/5ML PO SYRP: ORAL_SOLUTION | ORAL | 0 refills | Status: DC

## 2021-11-11 MED ORDER — PREDNISONE 10 MG PO TABS: ORAL_TABLET | ORAL | 0 refills | Status: AC
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
## 2021-11-11 NOTE — Assessment & Plan Note (Signed)
-  Rx. Prednisone and cough syrup -if she is still symptomatic on Monday 11/14/20, would consider a z-pack -symptoms started 12/26

## 2021-11-14 ENCOUNTER — Other Ambulatory Visit: Payer: Self-pay | Admitting: "Endocrinology

## 2021-11-16 ENCOUNTER — Telehealth: Payer: Self-pay | Admitting: Nurse Practitioner

## 2021-11-16 ENCOUNTER — Telehealth: Payer: Self-pay

## 2021-11-16 NOTE — Telephone Encounter (Signed)
Patient called she's having a lingering cough and states dr patel told her if the cough didn't get any better she may need a cough medication ? Pt cb# [phone]

## 2021-11-16 NOTE — Telephone Encounter (Signed)
Called to inform pt we received a box of ozempic from [employer] states she will pick up today

## 2021-11-17 ENCOUNTER — Telehealth: Payer: Medicare Other | Admitting: Internal Medicine

## 2021-11-17 ENCOUNTER — Other Ambulatory Visit: Payer: Self-pay | Admitting: Internal Medicine

## 2021-11-17 ENCOUNTER — Other Ambulatory Visit: Payer: Self-pay

## 2021-11-17 ENCOUNTER — Telehealth: Payer: Self-pay

## 2021-11-17 ENCOUNTER — Encounter: Payer: Medicare Other | Admitting: Family Medicine

## 2021-11-17 DIAGNOSIS — J069 Acute upper respiratory infection, unspecified: Secondary | ICD-10-CM

## 2021-11-17 MED ORDER — AZITHROMYCIN 250 MG PO TABS: ORAL_TABLET | ORAL | 0 refills | Status: AC

## 2021-11-17 NOTE — Telephone Encounter (Signed)
Patient dropped off some disability papers in December and patient needs done asap so she can get her check from [facility], had to reschedule her cpe (provider out of the office) for February needs papers completed by end of January 2023. Please contact patient at [phone].

## 2021-11-17 NOTE — Telephone Encounter (Signed)
Pt notified with verbal understanding  °

## 2021-11-17 NOTE — Telephone Encounter (Signed)
Will hold to ask brandi where forms are and if this can be done

## 2021-11-17 NOTE — Telephone Encounter (Signed)
Pt's husband picked up sample of ozempic

## 2021-11-18 DIAGNOSIS — E1129 Type 2 diabetes mellitus with other diabetic kidney complication: Secondary | ICD-10-CM | POA: Diagnosis not present

## 2021-11-18 DIAGNOSIS — E211 Secondary hyperparathyroidism, not elsewhere classified: Secondary | ICD-10-CM | POA: Diagnosis not present

## 2021-11-18 DIAGNOSIS — N189 Chronic kidney disease, unspecified: Secondary | ICD-10-CM | POA: Diagnosis not present

## 2021-11-18 DIAGNOSIS — I5032 Chronic diastolic (congestive) heart failure: Secondary | ICD-10-CM | POA: Diagnosis not present

## 2021-11-18 DIAGNOSIS — E1122 Type 2 diabetes mellitus with diabetic chronic kidney disease: Secondary | ICD-10-CM | POA: Diagnosis not present

## 2021-11-22 ENCOUNTER — Ambulatory Visit: Payer: Medicare Other | Admitting: Nutrition

## 2021-11-23 ENCOUNTER — Other Ambulatory Visit: Payer: Self-pay | Admitting: Family Medicine

## 2021-11-23 NOTE — Telephone Encounter (Signed)
Patient informed. 

## 2021-11-23 NOTE — Telephone Encounter (Signed)
Dr Moshe Cipro has forms and said she will address at her CPE but if needed before then she still will need an appt for the forms to be completed

## 2021-11-24 DIAGNOSIS — E211 Secondary hyperparathyroidism, not elsewhere classified: Secondary | ICD-10-CM | POA: Diagnosis not present

## 2021-11-24 DIAGNOSIS — I5032 Chronic diastolic (congestive) heart failure: Secondary | ICD-10-CM | POA: Diagnosis not present

## 2021-11-24 DIAGNOSIS — E1122 Type 2 diabetes mellitus with diabetic chronic kidney disease: Secondary | ICD-10-CM | POA: Diagnosis not present

## 2021-11-24 DIAGNOSIS — I129 Hypertensive chronic kidney disease with stage 1 through stage 4 chronic kidney disease, or unspecified chronic kidney disease: Secondary | ICD-10-CM | POA: Diagnosis not present

## 2021-11-24 DIAGNOSIS — E1129 Type 2 diabetes mellitus with other diabetic kidney complication: Secondary | ICD-10-CM | POA: Diagnosis not present

## 2021-11-24 DIAGNOSIS — R809 Proteinuria, unspecified: Secondary | ICD-10-CM | POA: Diagnosis not present

## 2021-11-24 DIAGNOSIS — N189 Chronic kidney disease, unspecified: Secondary | ICD-10-CM | POA: Diagnosis not present

## 2021-11-28 ENCOUNTER — Other Ambulatory Visit (HOSPITAL_COMMUNITY): Payer: Self-pay | Admitting: Adult Health

## 2021-11-28 DIAGNOSIS — Z1231 Encounter for screening mammogram for malignant neoplasm of breast: Secondary | ICD-10-CM

## 2021-11-28 NOTE — Patient Instructions (Signed)

## 2021-11-29 ENCOUNTER — Encounter: Payer: Self-pay | Admitting: Nurse Practitioner

## 2021-11-29 ENCOUNTER — Ambulatory Visit (INDEPENDENT_AMBULATORY_CARE_PROVIDER_SITE_OTHER): Payer: Medicare Other | Admitting: Nurse Practitioner

## 2021-11-29 ENCOUNTER — Other Ambulatory Visit: Payer: Self-pay

## 2021-11-29 ENCOUNTER — Telehealth: Payer: Self-pay

## 2021-11-29 VITALS — BP 118/70 | HR 106 | Ht 65.0 in | Wt 325.6 lb

## 2021-11-29 DIAGNOSIS — Z794 Long term (current) use of insulin: Secondary | ICD-10-CM | POA: Diagnosis not present

## 2021-11-29 DIAGNOSIS — N1832 Chronic kidney disease, stage 3b: Secondary | ICD-10-CM | POA: Diagnosis not present

## 2021-11-29 DIAGNOSIS — I10 Essential (primary) hypertension: Secondary | ICD-10-CM | POA: Diagnosis not present

## 2021-11-29 DIAGNOSIS — E559 Vitamin D deficiency, unspecified: Secondary | ICD-10-CM

## 2021-11-29 DIAGNOSIS — E1122 Type 2 diabetes mellitus with diabetic chronic kidney disease: Secondary | ICD-10-CM | POA: Diagnosis not present

## 2021-11-29 DIAGNOSIS — E782 Mixed hyperlipidemia: Secondary | ICD-10-CM

## 2021-11-29 LAB — POCT GLYCOSYLATED HEMOGLOBIN (HGB A1C): HbA1c, POC (controlled diabetic range): 9 % — AB (ref 0.0–7.0)

## 2021-11-29 MED ORDER — SEMAGLUTIDE (2 MG/DOSE) 8 MG/3ML ~~LOC~~ SOPN: 2.0000 mg | PEN_INJECTOR | SUBCUTANEOUS | 3 refills | Status: DC
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
## 2021-11-29 NOTE — Telephone Encounter (Signed)
[place] Patient Assistance form for dose change for Ozempic to them at [phone].

## 2021-11-30 ENCOUNTER — Encounter: Payer: Self-pay | Admitting: Family Medicine

## 2021-11-30 ENCOUNTER — Ambulatory Visit (INDEPENDENT_AMBULATORY_CARE_PROVIDER_SITE_OTHER): Payer: Medicare Other | Admitting: Family Medicine

## 2021-11-30 VITALS — BP 138/78 | HR 88 | Ht 65.0 in | Wt 324.0 lb

## 2021-11-30 DIAGNOSIS — R29898 Other symptoms and signs involving the musculoskeletal system: Secondary | ICD-10-CM

## 2021-11-30 DIAGNOSIS — J302 Other seasonal allergic rhinitis: Secondary | ICD-10-CM | POA: Diagnosis not present

## 2021-11-30 DIAGNOSIS — Z0289 Encounter for other administrative examinations: Secondary | ICD-10-CM

## 2021-11-30 DIAGNOSIS — E1122 Type 2 diabetes mellitus with diabetic chronic kidney disease: Secondary | ICD-10-CM | POA: Diagnosis not present

## 2021-11-30 DIAGNOSIS — I110 Hypertensive heart disease with heart failure: Secondary | ICD-10-CM

## 2021-11-30 DIAGNOSIS — Z794 Long term (current) use of insulin: Secondary | ICD-10-CM

## 2021-11-30 DIAGNOSIS — J309 Allergic rhinitis, unspecified: Secondary | ICD-10-CM | POA: Diagnosis not present

## 2021-11-30 DIAGNOSIS — N1832 Chronic kidney disease, stage 3b: Secondary | ICD-10-CM | POA: Diagnosis not present

## 2021-11-30 DIAGNOSIS — I10 Essential (primary) hypertension: Secondary | ICD-10-CM

## 2021-11-30 DIAGNOSIS — N183 Chronic kidney disease, stage 3 unspecified: Secondary | ICD-10-CM

## 2021-11-30 MED ORDER — AZELASTINE HCL 0.1 % NA SOLN: 2.0000 | Freq: Two times a day (BID) | NASAL | 12 refills | Status: DC

## 2021-11-30 NOTE — Assessment & Plan Note (Signed)
Uncontrolled Commit to astelin and flonase  daily will add beack singulair if needed

## 2021-11-30 NOTE — Patient Instructions (Addendum)
F/U ikn 5 months, call if you need me sooner  Add Astelin nasal spray every day to  [facility] for better allergy control ( Astelin is prescribed) Take certrizine every day  Use chlorpheniramine tabs one daily IF excess watery nasal drainage  Blood pressure is good  Work on better blood sugar control  Forms will be completed this week  Please get covid booster  Fasting labs ordered in October are PAST dUE, please get them this week, blood count, kidney, liver and cholesterol  It is important that you exercise regularly at least 30 minutes 5 times a week. If you develop chest pain, have severe difficulty breathing, or feel very tired, stop exercising immediately and seek medical attention   Think about what you will eat, plan ahead. Choose " clean, green, fresh or frozen" over canned, processed or packaged foods which are more sugary, salty and fatty. 70 to 75% of food eaten should be vegetables and fruit. Three meals at set times with snacks allowed between meals, but they must be fruit or vegetables. Aim to eat over a 12 hour period , example 7 am to 7 pm, and STOP after  your last meal of the day. Drink water,generally about 64 ounces per day, no other drink is as healthy. Fruit juice is best enjoyed in a healthy way, by EATING the fruit. Thanks for choosing [facility], we consider it a privelige to serve you.

## 2021-12-01 ENCOUNTER — Ambulatory Visit: Payer: Medicare Other | Admitting: Family Medicine

## 2021-12-02 DIAGNOSIS — I10 Essential (primary) hypertension: Secondary | ICD-10-CM | POA: Diagnosis not present

## 2021-12-02 DIAGNOSIS — D72829 Elevated white blood cell count, unspecified: Secondary | ICD-10-CM | POA: Diagnosis not present

## 2021-12-02 DIAGNOSIS — E785 Hyperlipidemia, unspecified: Secondary | ICD-10-CM | POA: Diagnosis not present

## 2021-12-03 LAB — CMP14+EGFR
ALT: 11 IU/L (ref 0–32)
AST: 12 IU/L (ref 0–40)
Albumin/Globulin Ratio: 1.6 (ref 1.2–2.2)
Albumin: 3.9 g/dL (ref 3.8–4.8)
Alkaline Phosphatase: 112 IU/L (ref 44–121)
BUN/Creatinine Ratio: 23 (ref 12–28)
BUN: 39 mg/dL — ABNORMAL HIGH (ref 8–27)
Bilirubin Total: 0.3 mg/dL (ref 0.0–1.2)
CO2: 26 mmol/L (ref 20–29)
Calcium: 9.4 mg/dL (ref 8.7–10.3)
Chloride: 101 mmol/L (ref 96–106)
Creatinine, Ser: 1.71 mg/dL — ABNORMAL HIGH (ref 0.57–1.00)
Globulin, Total: 2.5 g/dL (ref 1.5–4.5)
Glucose: 60 mg/dL — ABNORMAL LOW (ref 70–99)
Potassium: 4.4 mmol/L (ref 3.5–5.2)
Sodium: 141 mmol/L (ref 134–144)
Total Protein: 6.4 g/dL (ref 6.0–8.5)
eGFR: 34 mL/min/{1.73_m2} — ABNORMAL LOW (ref >59)

## 2021-12-03 LAB — CBC
Hematocrit: 37.7 % (ref 34.0–46.6)
Hemoglobin: 12.5 g/dL (ref 11.1–15.9)
MCH: 29.5 pg (ref 26.6–33.0)
MCHC: 33.2 g/dL (ref 31.5–35.7)
MCV: 89 fL (ref 79–97)
Platelets: 237 10*3/uL (ref 150–450)
RBC: 4.24 x10E6/uL (ref 3.77–5.28)
RDW: 14.5 % (ref 11.7–15.4)
WBC: 12.1 10*3/uL — ABNORMAL HIGH (ref 3.4–10.8)

## 2021-12-03 LAB — HEPATIC FUNCTION PANEL
ALT: 15 IU/L (ref 0–32)
AST: 12 IU/L (ref 0–40)
Albumin: 4 g/dL (ref 3.8–4.8)
Alkaline Phosphatase: 111 IU/L (ref 44–121)
Bilirubin Total: 0.3 mg/dL (ref 0.0–1.2)
Bilirubin, Direct: <0.1 mg/dL (ref 0.00–0.40)
Total Protein: 6.6 g/dL (ref 6.0–8.5)

## 2021-12-03 LAB — LIPID PANEL
Chol/HDL Ratio: 4 ratio (ref 0.0–4.4)
Cholesterol, Total: 285 mg/dL — ABNORMAL HIGH (ref 100–199)
HDL: 72 mg/dL (ref >39)
LDL Chol Calc (NIH): 199 mg/dL — ABNORMAL HIGH (ref 0–99)
Triglycerides: 87 mg/dL (ref 0–149)
VLDL Cholesterol Cal: 14 mg/dL (ref 5–40)

## 2021-12-04 ENCOUNTER — Encounter: Payer: Self-pay | Admitting: Family Medicine

## 2021-12-04 DIAGNOSIS — J309 Allergic rhinitis, unspecified: Secondary | ICD-10-CM | POA: Insufficient documentation

## 2021-12-04 NOTE — Assessment & Plan Note (Signed)
[name] is reminded of the importance of commitment to daily physical activity for 30 minutes or more, as able and the need to limit carbohydrate intake to 30 to 60 grams per meal to help with blood sugar control.   The need to take medication as prescribed, test blood sugar as directed, and to call between visits if there is a concern that blood sugar is uncontrolled is also discussed.   [name] is reminded of the importance of daily foot exam, annual eye examination, and good blood sugar, blood pressure and cholesterol control.  Diabetic Labs Latest Ref Rng & Units 12/02/2021 11/29/2021 08/29/2021 08/22/2021 05/25/2021  HbA1c 0.0 - 7.0 % - 9.0(A) 8.5(A) - 7.4(A)  Microalbumin mg/dL - - - - -  Micro/Creat Ratio <30 mcg/mg creat - - - - -  Chol 100 - 199 mg/dL 285(H) - - 234(H) -  HDL >39 mg/dL 72 - - 72 -  Calc LDL 0 - 99 mg/dL 199(H) - - 149(H) -  Triglycerides 0 - 149 mg/dL 87 - - 78 -  Creatinine 0.57 - 1.00 mg/dL 1.71(H) - - 1.59(H) -   BP/Weight 11/30/2021 11/29/2021 09/14/2021 08/29/2021 08/16/2021 05/25/2021 9/43/2003  Systolic BP 794 446 190 122 241 146 431  Diastolic BP 78 70 70 67 66 80 79  Wt. (Lbs) 324 325.6 328 330.6 335 326 360  BMI 53.92 54.18 54.58 55.01 55.75 54.25 59.91   Foot/eye exam completion dates Latest Ref Rng & Units 08/16/2021 12/29/2020  Eye Exam No Retinopathy - Retinopathy(A)  Foot Form Completion - Done -      Uncontrolled,managed by Endo

## 2021-12-04 NOTE — Assessment & Plan Note (Signed)
Form reviewed, updated where needed, will be completed and returned to patient, she continues to qualify for disability

## 2021-12-04 NOTE — Assessment & Plan Note (Signed)
Persistent grade 3 to 4 power, limiting ability to safely use , unable to carry out repetitive movement , and grip is weak

## 2021-12-04 NOTE — Assessment & Plan Note (Signed)
Uncontrolled, contributing to persistent cough. Start daily astelin, flonase and zyrtec, chlorpheniramine as needed

## 2021-12-04 NOTE — Progress Notes (Signed)
Kendra Hanson     MRN: [id number]      DOB: 1961-07-24   HPI Kendra Hanson is here for follow up and re-evaluation of chronic medical conditions, medication management and review of any available recent lab and radiology data.  4 week h/o cough following respiratory infection, non productive , but persistent Needs form completion for disability, which se has had in the past Preventive health is updated, specifically  Cancer screening and Immunization.   Questions or concerns regarding consultations or procedures which the PT has had in the interim are  addressed. The PT denies any adverse reactions to current medications since the last visit.     ROS Denies recent fever or chills. Denies sinus pressure, nasal congestion, ear pain or sore throat.  Denies chest pains, palpitations and leg swelling Denies abdominal pain, nausea, vomiting,diarrhea or constipation.   Denies dysuria, frequency, hesitancy or incontinence. Chronic  joint pain,  and limitation in mobility. Denies headaches, seizures, numbness, or tingling. Denies depression, anxiety or insomnia. Denies skin break down or rash.   PE  BP 138/78    Pulse 88    Ht 5\' 5"  (1.651 m)    Wt (!) 324 lb (147 kg)    SpO2 98%    BMI 53.92 kg/m   Patient alert and oriented and in no cardiopulmonary distress.  HEENT: No facial asymmetry, EOMI,     Neck supple .  Chest: Clear to auscultation bilaterally.  CVS: S1, S2 no murmurs, no S3.Regular rate.  ABD: Soft non tender.   Ext: No edema  MS: decreased  ROM spine, shoulders, hips and knees.Decreased power in left hand  Skin: Intact, no ulcerations or rash noted.  Psych: Good eye contact, normal affect. Memory intact not anxious or depressed appearing.  CNS: CN 2-12 intact, grade 4 power in upper extremities    Assessment & Plan  Seasonal allergies Uncontrolled Commit to astelin and flonase  daily will add beack singulair if needed  Hypertensive disorder DASH diet  and commitment to daily physical activity for a minimum of 30 minutes discussed and encouraged, as a part of hypertension management. The importance of attaining a healthy weight is also discussed.  BP/Weight 11/30/2021 11/29/2021 09/14/2021 08/29/2021 08/16/2021 05/25/2021 0/05/6225  Systolic BP 333 545 625 638 937 342 876  Diastolic BP 78 70 70 67 66 80 79  Wt. (Lbs) 324 325.6 328 330.6 335 326 360  BMI 53.92 54.18 54.58 55.01 55.75 54.25 59.91     Controlled, no change in medication   Hypertensive heart disease with heart failure (HCC) Clinically stable,no s/s of decompensation  Left hand weakness Persistent grade 3 to 4 power, limiting ability to safely use , unable to carry out repetitive movement , and grip is weak  Stage 3b chronic kidney disease (HCC) Stable, managed by Endo  Type 2 diabetes mellitus ([place]) Kendra Hanson is reminded of the importance of commitment to daily physical activity for 30 minutes or more, as able and the need to limit carbohydrate intake to 30 to 60 grams per meal to help with blood sugar control.   The need to take medication as prescribed, test blood sugar as directed, and to call between visits if there is a concern that blood sugar is uncontrolled is also discussed.   Kendra Hanson is reminded of the importance of daily foot exam, annual eye examination, and good blood sugar, blood pressure and cholesterol control.  Diabetic Labs Latest Ref Rng & Units 12/02/2021 11/29/2021 08/29/2021  08/22/2021 05/25/2021  HbA1c 0.0 - 7.0 % - 9.0(A) 8.5(A) - 7.4(A)  Microalbumin mg/dL - - - - -  Micro/Creat Ratio <30 mcg/mg creat - - - - -  Chol 100 - 199 mg/dL 285(H) - - 234(H) -  HDL >39 mg/dL 72 - - 72 -  Calc LDL 0 - 99 mg/dL 199(H) - - 149(H) -  Triglycerides 0 - 149 mg/dL 87 - - 78 -  Creatinine 0.57 - 1.00 mg/dL 1.71(H) - - 1.59(H) -   BP/Weight 11/30/2021 11/29/2021 09/14/2021 08/29/2021 08/16/2021 05/25/2021 1/50/5697  Systolic BP 948 016 553 748 130 270 786   Diastolic BP 78 70 70 67 66 80 79  Wt. (Lbs) 324 325.6 328 330.6 335 326 360  BMI 53.92 54.18 54.58 55.01 55.75 54.25 59.91   Foot/eye exam completion dates Latest Ref Rng & Units 08/16/2021 12/29/2020  Eye Exam No Retinopathy - Retinopathy(A)  Foot Form Completion - Done -      Uncontrolled,managed by Endo  Upper respiratory tract infection Resolved , however post infectios cough persits, symptomatic treatment only  Allergic sinusitis Uncontrolled, contributing to persistent cough. Start daily astelin, flonase and zyrtec, chlorpheniramine as needed  Encounter for completion of form with patient Form reviewed, updated where needed, will be completed and returned to patient, she continues to qualify for disability

## 2021-12-04 NOTE — Assessment & Plan Note (Signed)
Resolved , however post infectios cough persits, symptomatic treatment only

## 2021-12-04 NOTE — Assessment & Plan Note (Signed)
Clinically stable,no s/s of decompensation

## 2021-12-04 NOTE — Assessment & Plan Note (Signed)
Stable, managed by Endo

## 2021-12-04 NOTE — Assessment & Plan Note (Signed)
DASH diet and commitment to daily physical activity for a minimum of 30 minutes discussed and encouraged, as a part of hypertension management. The importance of attaining a healthy weight is also discussed.  BP/Weight 11/30/2021 11/29/2021 09/14/2021 08/29/2021 08/16/2021 05/25/2021 08/07/4627  Systolic BP 638 177 116 579 038 333 832  Diastolic BP 78 70 70 67 66 80 79  Wt. (Lbs) 324 325.6 328 330.6 335 326 360  BMI 53.92 54.18 54.58 55.01 55.75 54.25 59.91     Controlled, no change in medication

## 2021-12-05 ENCOUNTER — Other Ambulatory Visit: Payer: Self-pay | Admitting: Student

## 2021-12-06 ENCOUNTER — Encounter: Payer: Self-pay | Admitting: Family Medicine

## 2021-12-07 ENCOUNTER — Other Ambulatory Visit: Payer: Self-pay | Admitting: Family Medicine

## 2021-12-08 ENCOUNTER — Encounter: Payer: Self-pay | Admitting: Family Medicine

## 2021-12-09 ENCOUNTER — Other Ambulatory Visit: Payer: Self-pay

## 2021-12-09 ENCOUNTER — Encounter: Payer: Self-pay | Admitting: Family Medicine

## 2021-12-09 ENCOUNTER — Telehealth: Payer: Self-pay

## 2021-12-09 DIAGNOSIS — Z0279 Encounter for issue of other medical certificate: Secondary | ICD-10-CM

## 2021-12-09 MED ORDER — FENOFIBRATE 145 MG PO TABS: 145.0000 mg | ORAL_TABLET | Freq: Every day | ORAL | 3 refills | Status: DC

## 2021-12-09 NOTE — Telephone Encounter (Signed)
Said the fenofibrate she was recommended to start taking has not been called in. Please advise (also said her forms needed to be faxed by the end of the month)

## 2021-12-09 NOTE — Telephone Encounter (Signed)
Completed and faxed  Called and pt will pick up

## 2021-12-13 ENCOUNTER — Other Ambulatory Visit: Payer: Self-pay | Admitting: Podiatry

## 2021-12-13 DIAGNOSIS — M722 Plantar fascial fibromatosis: Secondary | ICD-10-CM

## 2021-12-13 NOTE — Telephone Encounter (Signed)
Tc to r/s appt

## 2021-12-16 ENCOUNTER — Ambulatory Visit: Payer: Medicare Other | Admitting: Cardiology

## 2021-12-16 ENCOUNTER — Telehealth: Payer: Self-pay | Admitting: "Endocrinology

## 2021-12-16 ENCOUNTER — Telehealth: Payer: Self-pay | Admitting: Nurse Practitioner

## 2021-12-16 NOTE — Telephone Encounter (Signed)
Pt is requesting a refill on ozempic [facility] Kendra Hanson

## 2021-12-16 NOTE — Telephone Encounter (Signed)
Patient's novolog patient assistance is ready for pick up.

## 2021-12-19 ENCOUNTER — Telehealth: Payer: Self-pay

## 2021-12-19 DIAGNOSIS — N1832 Chronic kidney disease, stage 3b: Secondary | ICD-10-CM

## 2021-12-19 MED ORDER — SEMAGLUTIDE (2 MG/DOSE) 8 MG/3ML ~~LOC~~ SOPN: 2.0000 mg | PEN_INJECTOR | SUBCUTANEOUS | 3 refills | Status: DC

## 2021-12-19 NOTE — Telephone Encounter (Signed)
Medication has been sent to the pharmacy requested. °

## 2021-12-21 ENCOUNTER — Telehealth: Payer: Self-pay | Admitting: Nurse Practitioner

## 2021-12-21 MED ORDER — SEMAGLUTIDE (2 MG/DOSE) 8 MG/3ML ~~LOC~~ SOPN: 2.0000 mg | PEN_INJECTOR | SUBCUTANEOUS | 3 refills | Status: DC

## 2021-12-21 NOTE — Telephone Encounter (Signed)
Talked with pt, she stated she has a voucher from [employer] with [place] # [id number], PCN [id number], Group # [id number], Member ID [id number], Exp Date 01/10/22. Sent Rx for [employer] with voucher information to [employer] as requested.

## 2021-12-21 NOTE — Telephone Encounter (Signed)
Can you send the Ozempic to [place]? Also, patient is asking for the nurse to call her in regards to her ozempic with novo nordisk  [phone]

## 2021-12-21 NOTE — Addendum Note (Signed)
Addended by: Ellin Saba on: 12/21/2021 11:41 AM   Modules accepted: Orders

## 2021-12-21 NOTE — Telephone Encounter (Signed)
Pt aware we have her Novolog here from patient assitance

## 2021-12-21 NOTE — Telephone Encounter (Signed)
Pt picked up her pt assistance

## 2021-12-22 ENCOUNTER — Encounter: Payer: Medicare Other | Admitting: Family Medicine

## 2022-01-04 ENCOUNTER — Encounter: Payer: Self-pay | Admitting: Podiatry

## 2022-01-04 ENCOUNTER — Encounter: Payer: Self-pay | Admitting: Nurse Practitioner

## 2022-01-04 NOTE — Telephone Encounter (Signed)
Please contact patient to schedule for diabetic shoes request.

## 2022-01-05 NOTE — Telephone Encounter (Signed)
Patient scheduled for 01/11/22 with Aaron Edelman

## 2022-01-05 NOTE — Telephone Encounter (Signed)
Thank you :)

## 2022-01-10 ENCOUNTER — Ambulatory Visit: Payer: Medicare Other

## 2022-01-10 ENCOUNTER — Encounter: Payer: Self-pay | Admitting: Family Medicine

## 2022-01-11 ENCOUNTER — Ambulatory Visit: Payer: Medicare Other

## 2022-01-11 ENCOUNTER — Other Ambulatory Visit: Payer: Self-pay

## 2022-01-11 DIAGNOSIS — J302 Other seasonal allergic rhinitis: Secondary | ICD-10-CM

## 2022-01-11 DIAGNOSIS — L84 Corns and callosities: Secondary | ICD-10-CM

## 2022-01-11 DIAGNOSIS — E1140 Type 2 diabetes mellitus with diabetic neuropathy, unspecified: Secondary | ICD-10-CM

## 2022-01-11 NOTE — Progress Notes (Signed)
SITUATION ?Reason for Consult: Evaluation for Prefabricated Diabetic Shoes and Custom Diabetic Inserts. ?Patient / Caregiver Report: Patient would like well fitting shoes ? ?OBJECTIVE DATA: ?Patient History / Diagnosis:  ?  ICD-10-CM   ?1. Diabetic neuropathy with neurologic complication (HCC)  I37.04   ? E11.49   ?  ?2. Corns and callosities  L84   ?  ? ?Patient sees Dr. Jimmye Norman at [facility] endocronology ? ?Current or Previous Devices:   Historical shoe user ? ?In-Person Foot Examination: ?Ulcers & Callousing:   Corns and callosites ? ?Deformities:   ?Pes planus ?  ?Shoe Size: 11XW ? ?ORTHOTIC RECOMMENDATION ?Recommended Devices: ?- 1x pair prefabricated PDAC approved diabetic shoes; Patient Selected - Kendra Hanson Black 821  Size 11XW ?- 3x pair custom-to-patient PDAC approved vacuum formed diabetic insoles. ? ?GOALS OF SHOES AND INSOLES ?- Reduce shear and pressure ?- Reduce / Prevent callus formation ?- Reduce / Prevent ulceration ?- Protect the fragile healing compromised diabetic foot. ? ?Patient would benefit from diabetic shoes and inserts as patient has diabetes mellitus and the patient has one or more of the following conditions: ?- History of pre-ulcerative callus ?- Peripheral neuropathy with evidence of callus formation ?- Foot deformity ?- Poor circulation ? ?ACTIONS PERFORMED ?Patient was casted for insoles via crush box and measured for shoes via brannock device. Procedure was explained and patient tolerated procedure well. All questions were answered and concerns addressed. ? ?PLAN ?Patient is to be contacted and scheduled for fitting once CMN is obtained from treaing physician and shoes and insoles have been fabricated and received. ? ?

## 2022-01-11 NOTE — Telephone Encounter (Signed)
Referral sent 

## 2022-01-12 ENCOUNTER — Telehealth: Payer: Self-pay

## 2022-01-12 NOTE — Telephone Encounter (Signed)
Casts sent to central fabrication - HOLD FOR CMN °

## 2022-01-16 ENCOUNTER — Inpatient Hospital Stay (HOSPITAL_COMMUNITY): Admission: RE | Admit: 2022-01-16 | Payer: Medicare Other | Source: Ambulatory Visit

## 2022-01-16 ENCOUNTER — Other Ambulatory Visit: Payer: Self-pay

## 2022-01-16 ENCOUNTER — Ambulatory Visit (HOSPITAL_COMMUNITY)
Admission: RE | Admit: 2022-01-16 | Discharge: 2022-01-16 | Disposition: A | Discharge status: Home / Self Care | Payer: Medicare Other | Source: Ambulatory Visit | Attending: Adult Health | Admitting: Adult Health

## 2022-01-16 DIAGNOSIS — Z1231 Encounter for screening mammogram for malignant neoplasm of breast: Secondary | ICD-10-CM

## 2022-01-18 DIAGNOSIS — M170 Bilateral primary osteoarthritis of knee: Secondary | ICD-10-CM | POA: Diagnosis not present

## 2022-01-19 ENCOUNTER — Telehealth: Payer: Self-pay | Admitting: Podiatry

## 2022-01-19 NOTE — Telephone Encounter (Signed)
Patient called wanting to know if you got the paper work back from her Endocrinologist ?  She is wanting to check the status of her diabetic shoes .

## 2022-01-20 ENCOUNTER — Other Ambulatory Visit: Payer: Self-pay

## 2022-01-20 ENCOUNTER — Ambulatory Visit (INDEPENDENT_AMBULATORY_CARE_PROVIDER_SITE_OTHER): Payer: Medicare Other | Admitting: Allergy & Immunology

## 2022-01-20 ENCOUNTER — Encounter: Payer: Self-pay | Admitting: Allergy & Immunology

## 2022-01-20 VITALS — BP 146/86 | HR 77 | Temp 98.4°F | Resp 16 | Ht 65.0 in | Wt 329.6 lb

## 2022-01-20 DIAGNOSIS — J310 Chronic rhinitis: Secondary | ICD-10-CM

## 2022-01-20 DIAGNOSIS — K219 Gastro-esophageal reflux disease without esophagitis: Secondary | ICD-10-CM

## 2022-01-20 DIAGNOSIS — R053 Chronic cough: Secondary | ICD-10-CM

## 2022-01-20 DIAGNOSIS — J45998 Other asthma: Secondary | ICD-10-CM | POA: Diagnosis not present

## 2022-01-20 NOTE — Patient Instructions (Addendum)
1. Chronic cough ?- Lung testing was in the 50% range and it DID NOT improve with the albuterol treatment. ?- I am not convinced that this is asthma but I do think that it is worth a try of inhaled controller medications to see if this helps. ?- We are going to start Breztri two puffs twice daily (this contains THREE medications to help with asthma and keep your lungs open). ?- Spacer sample and demonstration provided. ?- Daily controller medication(s):  Breztri two puffs twice daily with spacer. ?- Prior to physical activity: albuterol 2 puffs 10-15 minutes before physical activity. ?- Rescue medications: albuterol 4 puffs every 4-6 hours as needed ?- Asthma control goals:  ?* Full participation in all desired activities (may need albuterol before activity) ?* Albuterol use two time or less a week on average (not counting use with activity) ?* Cough interfering with sleep two time or less a month ?* Oral steroids no more than once a year ?* No hospitalizations ? ?2. Chronic rhinitis ?- Testing today showed: NEGATIVE to the entire panel, so this is likely NOT allergies.  ?- Copy of test results provided.   ?- Stop taking: everything that you are taking now ?- Start taking: Atrovent (ipratropium) 0.03% one spray per nostril 2-3 times daily as needed (CAN BE OVER DRYING) ?- You can use an extra dose of the antihistamine, if needed, for breakthrough symptoms.  ?- Consider nasal saline rinses 1-2 times daily to remove allergens from the nasal cavities as well as help with mucous clearance (this is especially helpful to do before the nasal sprays are given) ? ?3. Gastroesophageal reflux disease ?- I would restart the Protonix and use consistently for FOUR WEEKS until we see you again.  ?- Also add on Pepcid (famotidine) '40mg'$  twice daily for FOUR WEEKS until we see you again.  ? ?4. Return in about 4 weeks (around 02/17/2022).  ? ? ?Please inform us of any Emergency Department visits, hospitalizations, or changes in  symptoms. Call us before going to the ED for breathing or allergy symptoms since we might be able to fit you in for a sick visit. Feel free to contact us anytime with any questions, problems, or concerns. ? ?It was a pleasure to meet you today! ? ?Websites that have reliable patient information: ?1. American Academy of Asthma, Allergy, and Immunology: www.aaaai.org ?2. Food Allergy Research and Education (FARE): foodallergy.org ?3. Mothers of Asthmatics: http://www.asthmacommunitynetwork.org ?4. [employer] of Allergy, Asthma, and Immunology: MonthlyElectricBill.co.uk ? ? ?COVID-19 Vaccine Information can be found at: ShippingScam.co.uk For questions related to vaccine distribution or appointments, please email [email or web address] or call [phone].  ? ?We realize that you might be concerned about having an allergic reaction to the COVID19 vaccines. To help with that concern, WE ARE OFFERING THE COVID19 VACCINES IN OUR OFFICE! Ask the front desk for dates!  ? ? ? ??Like? [place] on Facebook and Instagram for our latest updates!  ?  ? ? ?A healthy democracy works best when [employer] participate! Make sure you are registered to vote! If you have moved or changed any of your contact information, you will need to get this updated before voting! ? ?In some cases, you MAY be able to register to vote online: [email or web address] ? ? ? ? ? ? Airborne Adult Perc - 01/20/22 1536   ? ? Time Antigen Placed 1536   ? Allergen Manufacturer Lavella Hammock   ? Location Back   ? Number of Test 59   ? 1.  Control-Buffer 50% Glycerol Negative   ? 2. Control-Histamine 1 mg/ml 2+   ? 3. Albumin saline Negative   ? 4. [place] Negative   ? 5. [place] Negative   ? 6. Johnson Negative   ? 7. [place] Blue Negative   ? 8. Meadow Fescue Negative   ? 9. Perennial Rye Negative   ? 10. Sweet Vernal Negative   ? 11. Timothy Negative   ? 12. Cocklebur Negative   ? 13. Burweed  Marshelder Negative   ? 14. Ragweed, short Negative   ? 15. Ragweed, Giant Negative   ? 16. Plantain,  English Negative   ? 17. Lamb's Quarters Negative   ? 18. Sheep Sorrell Negative   ? 19. Rough Pigweed Negative   ? 20. Marsh Elder, Rough Negative   ? 21. Mugwort, Common Negative   ? 22. Ash mix Negative   ? 23. Wendee Copp mix Negative   ? 24. Beech American Negative   ? 25. Box, Elder Negative   ? 26. Cedar, red Negative   ? 27. Cottonwood, [place] Negative   ? 28. Elm mix Negative   ? 29. Hickory Negative   ? 30. Maple mix Negative   ? 31. Oak, [place] mix Negative   ? 32. Pecan Pollen Negative   ? 33. Pine mix Negative   ? 34. Sycamore Eastern Negative   ? 35. Walnut, Black Pollen Negative   ? 36. Alternaria alternata Negative   ? 67. Cladosporium Herbarum Negative   ? 38. Aspergillus mix Negative   ? 39. Penicillium mix Negative   ? 40. Bipolaris sorokiniana (Helminthosporium) Negative   ? 41. Drechslera spicifera (Curvularia) Negative   ? 42. Mucor plumbeus Negative   ? 43. Fusarium moniliforme Negative   ? 44. Aureobasidium pullulans (pullulara) Negative   ? 45. Rhizopus oryzae Negative   ? 46. Botrytis cinera Negative   ? 47. Epicoccum nigrum Negative   ? 48. Phoma betae Negative   ? 49. Candida Albicans Negative   ? 50. Trichophyton mentagrophytes Negative   ? 51. Mite, D Farinae  5,000 AU/ml Negative   ? 52. Mite, D Pteronyssinus  5,000 AU/ml Negative   ? 53. Cat Hair 10,000 BAU/ml Negative   ? 54.  Dog Epithelia Negative   ? 55. Mixed Feathers Negative   ? 56. Horse Epithelia Negative   ? 57. Cockroach, [place] Negative   ? 58. Mouse Negative   ? 59. Tobacco Leaf Negative   ? ?  ?  ? ?  ? ? Intradermal - 01/20/22 1622   ? ? Time Antigen Placed --   ? Allergen Manufacturer --   ? Location --   ? Number of Test --   ? Control Negative   ? [place] Negative   ? Johnson Negative   ? 7 Grass Negative   ? Ragweed mix Negative   ? Weed mix Negative   ? Tree mix Negative   ? Mold 1 Negative   ? Mold 2 Negative   ?  Mold 3 Negative   ? Mold 4 Negative   ? Cat Negative   ? Dog Negative   ? Cockroach Negative   ? Mite mix Negative   ? ?  ?  ? ?  ? ? ? ? ? ? ?

## 2022-01-20 NOTE — Progress Notes (Signed)
NEW PATIENT  Date of Service/Encounter:  01/20/22  Consult requested by: Fayrene Helper, MD   Assessment:   Chronic cough  Non-allergic rhinitis  Gastroesophageal reflux disease  Plan/Recommendations:   1. Chronic cough - Lung testing was in the 50% range and it DID NOT improve with the albuterol treatment. - I am not convinced that this is asthma but I do think that it is worth a try of inhaled controller medications to see if this helps. - We are going to start Breztri two puffs twice daily (this contains THREE medications to help with asthma and keep your lungs open). - Spacer sample and demonstration provided. - Daily controller medication(s):  Breztri two puffs twice daily with spacer. - Prior to physical activity: albuterol 2 puffs 10-15 minutes before physical activity. - Rescue medications: albuterol 4 puffs every 4-6 hours as needed - Asthma control goals:  * Full participation in all desired activities (may need albuterol before activity) * Albuterol use two time or less a week on average (not counting use with activity) * Cough interfering with sleep two time or less a month * Oral steroids no more than once a year * No hospitalizations  2. Chronic rhinitis - Testing today showed: NEGATIVE to the entire panel, so this is likely NOT allergies.  - Copy of test results provided.   - Stop taking: everything that you are taking now - Start taking: Atrovent (ipratropium) 0.03% one spray per nostril 2-3 times daily as needed (CAN BE OVER DRYING) - You can use an extra dose of the antihistamine, if needed, for breakthrough symptoms.  - Consider nasal saline rinses 1-2 times daily to remove allergens from the nasal cavities as well as help with mucous clearance (this is especially helpful to do before the nasal sprays are given)  3. Gastroesophageal reflux disease - I would restart the Protonix and use consistently for FOUR WEEKS until we see you again.  - Also  add on Pepcid (famotidine) 30m twice daily for FOUR WEEKS until we see you again.   4. Return in about 4 weeks (around 02/17/2022).      This note in its entirety was forwarded to the Provider who requested this consultation.  Subjective:   Kendra FREEMANis a 61y.o. female presenting today for evaluation of  Chief Complaint  Patient presents with   Allergic Rhinitis     Cough, sneezing, eye watering for months. Has tired antibiotics, antihistamines, diabetic cough medicine, prednisone, singular,mucinex and nothing has helped. Clear mucus    Cough   Other    Stage 3 kidney failure     Kendra KENNARDhas a history of the following: Patient Active Problem List   Diagnosis Date Noted   Allergic sinusitis 12/04/2021   Upper respiratory tract infection 11/11/2021   Fibroids 04/12/2021   PMB (postmenopausal bleeding) 03/08/2021   Encounter for screening fecal occult blood testing 03/08/2021   Sore throat 01/02/2021   Encounter to obtain excuse from work 12/27/2020   Pseudophakia of both eyes 11/16/2020   Neck pain, bilateral 09/30/2020   Encounter for completion of form with patient 09/30/2020   Left hand weakness 09/29/2020   Lymphedema 09/28/2020   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 determined by examination associated with type 2 diabetes mellitus ([place] 03/15/2020   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 determined by examination associated with type 2 diabetes mellitus ([place] 03/15/2020   Retinal hemorrhage of right eye 03/15/2020  Retinal hemorrhage of left eye 03/15/2020   Cystoid macular edema of left eye 03/15/2020   Retinal telangiectasia of left eye 03/15/2020   Pain and swelling of lower leg, right 04/21/2019   Stage 3b chronic kidney disease ([place]) 01/30/2019   Lower extremity edema 10/31/2018   Ulnar neuropathy 10/05/2018   Hypertensive heart disease with heart failure ([place]) 08/29/2017   Menopausal symptom  06/26/2016   Atrophic vaginitis 04/02/2016   Osteoarthritis of both knees 12/27/2015   Normocytic normochromic anemia 12/27/2015   Osteoarthritis of knee 12/27/2015   Carpal tunnel syndrome 07/12/2015   Numbness of lower limb 07/12/2015   Pigmented skin lesion 05/06/2015   Iron deficiency 03/14/2015   Seasonal allergies 02/01/2015   Hemorrhoid 09/25/2013   Other seasonal allergic rhinitis 09/25/2013   Obstructive sleep apnea hypopnea, moderate 06/13/2013   Vitamin D deficiency 06/05/2012   Gastroesophageal reflux disease 06/03/2012   Leiomyoma of uterus 10/30/2008   Type 2 diabetes mellitus ([place]) 02/25/2008   Hyperlipidemia LDL goal <100 02/25/2008   Morbid obesity ([place]) 02/25/2008   Hypertensive disorder 02/25/2008    History obtained from: chart review and patient.  Kendra Hanson was referred by Fayrene Helper, MD.     Kendra Hanson is a 61 y.o. female presenting for a follow up visit.   Asthma/Respiratory Symptom History: She has had a cough for several years. She has gotten a lot of different medications without improvement. She has had prednisone tablets and a steroid shot. She has had antihistamines and cough medications. She has done Tessalon pearls. Nothing has worked at all. She has never been diagnosed with asthma. This is particularly bad at night.  She did have COVID19 in September but the cough was happening before this. She had a CXR that was negative.   She has seen Dr. Elsworth Soho with Pulmonology in January 2022. She is supposed to be on a CPAP and he was managing her OSA. It does not seem that he has addressed the cough. This was the only time that she saw Pulmonology.   Allergic Rhinitis Symptom History: She has been on cetirizine.  She has been on Chlortabs and Flonase. She has not had a fever during this time. She has been on montelukast without improvement. She been on azelastine. She has never been allergy tested in the past.   GERD Symptom History: She was on  pantoprazole but this did not help.  She is not taking anything on a regular basis now. She has not seen GI at all.   She was on disability for kidney failure and knee pain. She has been out since 2018 or so. 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   Otherwise, there is no history of other atopic diseases, including asthma, food allergies, drug allergies, environmental allergies, stinging insect allergies, eczema, urticaria, or contact dermatitis. There is no significant infectious history. Vaccinations are up to date.    Past Medical History: Patient Active Problem List   Diagnosis Date Noted   Allergic sinusitis 12/04/2021   Upper respiratory tract infection 11/11/2021   Fibroids 04/12/2021   PMB (postmenopausal bleeding) 03/08/2021   Encounter for screening fecal occult blood testing 03/08/2021   Sore throat 01/02/2021   Encounter to obtain excuse from work 12/27/2020   Pseudophakia of both eyes 11/16/2020   Neck pain, bilateral 09/30/2020   Encounter for completion of form with patient 09/30/2020   Left hand weakness 09/29/2020   Lymphedema 09/28/2020   Stable treated proliferative diabetic retinopathy of  left eye determined by examination associated with type 2 diabetes mellitus ([place]) 03/15/2020   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 determined by examination associated with type 2 diabetes mellitus ([place]) 03/15/2020   Retinal hemorrhage of right eye 03/15/2020   Retinal hemorrhage of left eye 03/15/2020   Cystoid macular edema of left eye 03/15/2020   Retinal telangiectasia of left eye 03/15/2020   Pain and swelling of lower leg, right 04/21/2019   Stage 3b chronic kidney disease ([place]) 01/30/2019   Lower extremity edema 10/31/2018   Ulnar neuropathy 10/05/2018   Hypertensive heart disease with heart failure ([place]) 08/29/2017   Menopausal symptom 06/26/2016   Atrophic vaginitis 04/02/2016   Osteoarthritis of both knees 12/27/2015    Normocytic normochromic anemia 12/27/2015   Osteoarthritis of knee 12/27/2015   Carpal tunnel syndrome 07/12/2015   Numbness of lower limb 07/12/2015   Pigmented skin lesion 05/06/2015   Iron deficiency 03/14/2015   Seasonal allergies 02/01/2015   Hemorrhoid 09/25/2013   Other seasonal allergic rhinitis 09/25/2013   Obstructive sleep apnea hypopnea, moderate 06/13/2013   Vitamin D deficiency 06/05/2012   Gastroesophageal reflux disease 06/03/2012   Leiomyoma of uterus 10/30/2008   Type 2 diabetes mellitus ([place]) 02/25/2008   Hyperlipidemia LDL goal <100 02/25/2008   Morbid obesity ([place]) 02/25/2008   Hypertensive disorder 02/25/2008    Medication List:  Allergies as of 01/20/2022       Reactions   Nsaids    Kidney function   Diclofenac Other (See Comments)   hallucination   Oxycodone    Vomiting    Other Itching        Medication List        Accurate as of January 20, 2022 11:59 PM. If you have any questions, ask your nurse or doctor.          Accu-Chek Guide Me w/Device Kit 1 Piece by Does not apply route as directed.   Accu-Chek Guide test strip Generic drug: glucose blood USE TO TEST BLOOD SUGAR THREE TIMES DAILY AS DIRECTED.   acetaminophen 500 MG tablet Commonly known as: TYLENOL Take 500 mg by mouth every 6 (six) hours as needed.   azelastine 0.1 % nasal spray Commonly known as: ASTELIN Place 2 sprays into both nostrils 2 (two) times daily. Use in each nostril as directed   calcitRIOL 0.25 MCG capsule Commonly known as: ROCALTROL Take 0.25 mcg by mouth 3 (three) times a week.   chlorpheniramine 4 MG tablet Commonly known as: CHLOR-TRIMETON Take one tablet by mouth once daily, as needed, for excess sinus drainage   cholecalciferol 25 MCG (1000 UNIT) tablet Commonly known as: VITAMIN D3 Take 1,000 Units by mouth daily.   diltiazem 360 MG 24 hr capsule Commonly known as: CARDIZEM CD TAKE 1 CAPSULE BY MOUTH DAILY   ezetimibe 10 MG  tablet Commonly known as: ZETIA Take 1 tablet (10 mg total) by mouth daily.   fenofibrate 145 MG tablet Commonly known as: Tricor Take 1 tablet (145 mg total) by mouth daily.   Global Ease Inject Pen Needles 31G X 8 MM Misc Generic drug: Insulin Pen Needle USE UP TO 5 TIMES A DAY AS DIRECTED.   HumuLIN 70/30 KwikPen (70-30) 100 UNIT/ML KwikPen Generic drug: insulin isophane & regular human KwikPen Inject 40-50 Units into the skin 2 (two) times daily with a meal.   lisinopril 2.5 MG tablet Commonly known as: ZESTRIL Take by mouth.   metolazone 2.5 MG tablet Commonly known as: ZAROXOLYN Take  2.5 mg by mouth daily.   mometasone 50 MCG/ACT nasal spray Commonly known as: Nasonex Place 2 sprays into the nose daily.   montelukast 10 MG tablet Commonly known as: SINGULAIR Take 10 mg by mouth at bedtime.   neomycin-polymyxin b-dexamethasone 3.5-10000-0.1 Oint Commonly known as: MAXITROL   ondansetron 4 MG tablet Commonly known as: ZOFRAN Take 1 tablet (4 mg total) by mouth 2 (two) times daily as needed for nausea or vomiting.   pantoprazole 20 MG tablet Commonly known as: PROTONIX Take 1 tablet (20 mg total) by mouth daily.   potassium chloride 10 MEQ tablet Commonly known as: KLOR-CON TAKE ONE TABLET BY MOUTH 3 TIMES A DAY   progesterone 200 MG capsule Commonly known as: Prometrium Take 1 at bedtime   promethazine-dextromethorphan 6.25-15 MG/5ML syrup Commonly known as: PROMETHAZINE-DM Take one teaspoon by mouth at bedtime as needed, for excess cough   Semaglutide (2 MG/DOSE) 8 MG/3ML Sopn Inject 2 mg as directed once a week.   simethicone 80 MG chewable tablet Commonly known as: MYLICON Chew 80 mg by mouth every 6 (six) hours as needed for flatulence.   torsemide 20 MG tablet Commonly known as: DEMADEX Take 1 tablet (20 mg total) by mouth 2 (two) times daily.   vitamin B-12 100 MCG tablet Commonly known as: CYANOCOBALAMIN Take 100 mcg by mouth daily.    vitamin C 1000 MG tablet Take 500 mg by mouth daily.        Birth History: non-contributory  Developmental History: non-contributory  Past Surgical History: Past Surgical History:  Procedure Laterality Date   CATARACT EXTRACTION W/PHACO Right    COLONOSCOPY N/A 04/05/2015   Procedure: COLONOSCOPY;  Surgeon: Rogene Houston, MD;  [facility]: [facility];  Service: Endoscopy;  Laterality: N/A;  730   DENTAL SURGERY     ESOPHAGOGASTRODUODENOSCOPY N/A 01/21/2016   Procedure: ESOPHAGOGASTRODUODENOSCOPY (EGD);  Surgeon: Rogene Houston, MD;  Location: [facility];  Service: Endoscopy;  Laterality: N/A;  11:15   EYE SURGERY N/A    [employer] 10/09/2020   REFRACTIVE SURGERY  2011   Bilateral, two seperate occasions first in 2006   [place] DETACHMENT SURGERY Bilateral 05/2005     Family History: Family History  Problem Relation Age of Onset   Hypertension Mother    Diabetes Mother    Kidney failure Mother    Stroke Mother    Kidney failure Father    Diabetes Father    Heart attack Father    COPD Sister    Diabetes Brother    Diabetes Brother    Diabetes Brother    Diabetes Brother    Diabetes Brother    Hypertension Brother      Social History: Ruqayyah lives at home with her family.  She lives in a house that is 62 years old.  There is hardwood throughout the home.  She has gas heating and electric heating as well as central cooling.  She also has a heat pump.  There are no animals inside or outside of the home.  There are dust mite covers on the bed, but not the pillows.  There is no tobacco exposure.  She is currently on disability.  She is not exposed to fumes, chemicals, or dust.  She does have a HEPA filter in her home.  She does not live near an interstate or industrial area.   Review of Systems  Constitutional: Negative.  Negative for chills, fever, malaise/fatigue and  weight loss.  HENT:  Positive for congestion. Negative for ear discharge  and ear pain.        Positive for postnasal drip.  Eyes:  Negative for pain, discharge and redness.  Respiratory:  Positive for cough and shortness of breath. Negative for sputum production and wheezing.   Cardiovascular: Negative.  Negative for chest pain and palpitations.  Gastrointestinal:  Negative for abdominal pain, diarrhea, heartburn, nausea and vomiting.  Skin: Negative.  Negative for itching and rash.  Neurological:  Negative for dizziness and headaches.  Endo/Heme/Allergies:  Negative for environmental allergies. Does not bruise/bleed easily.      Objective:   Blood pressure (!) 146/86, pulse 77, temperature 98.4 F (36.9 C), resp. rate 16, height _0  (1.651 m), weight (!) 329 lb 9.6 oz (149.5 kg), SpO2 97 %. Body mass index is 54.85 kg/m.     Physical Exam Vitals reviewed.  Constitutional:      Appearance: She is well-developed.  HENT:     Head: Normocephalic and atraumatic.     Right Ear: Tympanic membrane, ear canal and external ear normal. No drainage, swelling or tenderness. Tympanic membrane is not injected, scarred, erythematous, retracted or bulging.     Left Ear: Tympanic membrane, ear canal and external ear normal. No drainage, swelling or tenderness. Tympanic membrane is not injected, scarred, erythematous, retracted or bulging.     Nose: No nasal deformity, septal deviation, mucosal edema or rhinorrhea.     Right Turbinates: Enlarged, swollen and pale.     Left Turbinates: Enlarged, swollen and pale.     Right Sinus: No maxillary sinus tenderness or frontal sinus tenderness.     Left Sinus: No maxillary sinus tenderness or frontal sinus tenderness.     Comments: No nasal polyps.    Mouth/Throat:     Mouth: Mucous membranes are not pale and not dry.     Pharynx: Uvula midline.  Eyes:     General:        Right eye: No discharge.        Left eye: No discharge.     Conjunctiva/sclera: Conjunctivae normal.     Right eye: Right conjunctiva is not  injected. No chemosis.    Left eye: Left conjunctiva is not injected. No chemosis.    Pupils: Pupils are equal, round, and reactive to light.  Cardiovascular:     Rate and Rhythm: Normal rate and regular rhythm.     Heart sounds: Normal heart sounds.  Pulmonary:     Effort: Pulmonary effort is normal. No tachypnea, accessory muscle usage or respiratory distress.     Breath sounds: Normal breath sounds. No wheezing, rhonchi or rales.     Comments: Moving air well in all lung fields.  No crackles or wheezes. Chest:     Chest wall: No tenderness.  Abdominal:     Tenderness: There is no abdominal tenderness. There is no guarding or rebound.  Lymphadenopathy:     Head:     Right side of head: No submandibular, tonsillar or occipital adenopathy.     Left side of head: No submandibular, tonsillar or occipital adenopathy.     Cervical: No cervical adenopathy.  Skin:    Coloration: Skin is not pale.     Findings: No abrasion, erythema, petechiae or rash. Rash is not papular, urticarial or vesicular.  Neurological:     Mental Status: She is alert.  Psychiatric:        Behavior: Behavior is cooperative.  Diagnostic studies:    Spirometry: results abnormal (FEV1: 1.12/51%, FVC: 1.47/53%, FEV1/FVC: 76%).    Spirometry consistent with possible restrictive disease. Xopenex 6 puffs via MDI treatment given in clinic with no improvement.  Allergy Studies:     Airborne Adult Perc - 01/20/22 1536     Time Antigen Placed 1536    Allergen Manufacturer Lavella Hammock    Location Back    Number of Test 59    1. Control-Buffer 50% Glycerol Negative    2. Control-Histamine 1 mg/ml 2+    3. Albumin saline Negative    4. [place] Negative    5. [place] Negative    6. Johnson Negative    7. Kenly Blue Negative    8. Meadow Fescue Negative    9. Perennial Rye Negative    10. Sweet Vernal Negative    11. Timothy Negative    12. Cocklebur Negative    13. Burweed Marshelder Negative    14. Ragweed,  short Negative    15. Ragweed, Giant Negative    16. Plantain,  English Negative    17. Lamb's Quarters Negative    18. Sheep Sorrell Negative    19. Rough Pigweed Negative    20. Marsh Elder, Rough Negative    21. Mugwort, Common Negative    22. Ash mix Negative    23. Birch mix Negative    24. Beech American Negative    25. Box, Elder Negative    26. Cedar, red Negative    27. Cottonwood, [place] Negative    28. Elm mix Negative    29. Hickory Negative    30. Maple mix Negative    31. Oak, [place] mix Negative    32. Pecan Pollen Negative    33. Pine mix Negative    34. Sycamore Eastern Negative    35. [place], Black Pollen Negative    36. Alternaria alternata Negative    37. Cladosporium Herbarum Negative    38. Aspergillus mix Negative    39. Penicillium mix Negative    40. Bipolaris sorokiniana (Helminthosporium) Negative    41. Drechslera spicifera (Curvularia) Negative    42. Mucor plumbeus Negative    43. Fusarium moniliforme Negative    44. Aureobasidium pullulans (pullulara) Negative    45. Rhizopus oryzae Negative    46. Botrytis cinera Negative    47. Epicoccum nigrum Negative    48. Phoma betae Negative    49. Candida Albicans Negative    50. Trichophyton mentagrophytes Negative    51. Mite, D Farinae  5,000 AU/ml Negative    52. Mite, D Pteronyssinus  5,000 AU/ml Negative    53. Cat Hair 10,000 BAU/ml Negative    54.  Dog Epithelia Negative    55. Mixed Feathers Negative    56. Horse Epithelia Negative    57. Cockroach, German Negative    58. Mouse Negative    59. Tobacco Leaf Negative             Intradermal - 01/20/22 1622     Time Antigen Placed --    Allergen Manufacturer --    Location --    Number of Test --    Control Negative    [place] Negative    Johnson Negative    7 Grass Negative    Ragweed mix Negative    Weed mix Negative    Tree mix Negative    Mold 1 Negative    Mold 2 Negative    Mold 3 Negative  Mold 4 Negative     Cat Negative    Dog Negative    Cockroach Negative    Mite mix Negative             Allergy testing results were read and interpreted by myself, documented by clinical staff.         Salvatore Marvel, MD [facility] [place] [place]

## 2022-01-23 ENCOUNTER — Other Ambulatory Visit: Payer: Self-pay | Admitting: *Deleted

## 2022-01-23 ENCOUNTER — Telehealth: Payer: Self-pay

## 2022-01-23 ENCOUNTER — Encounter: Payer: Self-pay | Admitting: Allergy & Immunology

## 2022-01-23 MED ORDER — PANTOPRAZOLE SODIUM 20 MG PO TBEC: 20.0000 mg | DELAYED_RELEASE_TABLET | Freq: Every day | ORAL | 1 refills | Status: DC

## 2022-01-23 MED ORDER — FAMOTIDINE 40 MG PO TABS: 40.0000 mg | ORAL_TABLET | Freq: Two times a day (BID) | ORAL | 1 refills | Status: DC

## 2022-01-23 NOTE — Telephone Encounter (Signed)
Patient called to see if we were going to send in Protonix & Pepcid.  ? ?Please send to [facility]  ?

## 2022-01-23 NOTE — Telephone Encounter (Signed)
Called and informed patient of refills being sent in. Patient verbalized understanding.  ?

## 2022-01-23 NOTE — Telephone Encounter (Signed)
Refills have been sent in to requested pharmacy. Called and left a voicemail asking for patient to return call to inform.  ?

## 2022-01-24 ENCOUNTER — Encounter: Payer: Self-pay | Admitting: Podiatry

## 2022-01-24 ENCOUNTER — Encounter: Payer: Self-pay | Admitting: Nurse Practitioner

## 2022-02-03 ENCOUNTER — Telehealth: Payer: Self-pay

## 2022-02-03 DIAGNOSIS — Z794 Long term (current) use of insulin: Secondary | ICD-10-CM | POA: Diagnosis not present

## 2022-02-03 DIAGNOSIS — E1122 Type 2 diabetes mellitus with diabetic chronic kidney disease: Secondary | ICD-10-CM | POA: Diagnosis not present

## 2022-02-03 NOTE — Telephone Encounter (Signed)
Shoes ordered - [employer] [place] [employer] 11XW ?

## 2022-02-03 NOTE — Telephone Encounter (Signed)
Cmn received, ok to proceed with shoe order.[place] notified.

## 2022-02-09 NOTE — Patient Instructions (Addendum)
1. Chronic cough. ?Stop Breztri ?- Daily controller medication(s): Start Advair HFA 115/21 mcg two puffs twice daily with spacer.  Make sure and rinse your mouth out afterwards ?- Prior to physical activity: albuterol 2 puffs 10-15 minutes before physical activity. ?- Rescue medications: albuterol 4 puffs every 4-6 hours as needed ?- Asthma control goals:  ?* Full participation in all desired activities (may need albuterol before activity) ?* Albuterol use two time or less a week on average (not counting use with activity) ?* Cough interfering with sleep two time or less a month ?* Oral steroids no more than once a year ?* No hospitalizations ? ?2. Chronic rhinitis (she did not ever get a prescription for ipratropium bromide nasal spray so we will hold off on sending this in since her cough is better) ?- Testing on03/08/2022 showed: NEGATIVE to the entire panel, so this is likely NOT allergies.  ?- You can use an extra dose of the antihistamine, if needed, for breakthrough symptoms.  ?- Consider nasal saline rinses 1-2 times daily to remove allergens from the nasal cavities as well as help with mucous clearance (this is especially helpful to do before the nasal sprays are given) ? ?3. Gastroesophageal reflux disease ?- Continue  Protonix and use consistently ?- Continue Pepcid (famotidine) '40mg'$  twice daily  ? ?4. Schedule a follow up appointment in 2-3 months ? ? ? ? ? ? ? ? ?

## 2022-02-10 ENCOUNTER — Ambulatory Visit (INDEPENDENT_AMBULATORY_CARE_PROVIDER_SITE_OTHER): Payer: Medicare Other | Admitting: Family

## 2022-02-10 ENCOUNTER — Encounter: Payer: Self-pay | Admitting: Family

## 2022-02-10 VITALS — BP 140/82 | HR 99 | Temp 97.5°F | Resp 20 | Ht 65.0 in | Wt 323.0 lb

## 2022-02-10 DIAGNOSIS — R053 Chronic cough: Secondary | ICD-10-CM

## 2022-02-10 DIAGNOSIS — K219 Gastro-esophageal reflux disease without esophagitis: Secondary | ICD-10-CM

## 2022-02-10 DIAGNOSIS — J310 Chronic rhinitis: Secondary | ICD-10-CM

## 2022-02-10 MED ORDER — ADVAIR HFA 115-21 MCG/ACT IN AERO: 2.0000 | INHALATION_SPRAY | Freq: Two times a day (BID) | RESPIRATORY_TRACT | 5 refills | Status: DC

## 2022-02-10 MED ORDER — ALBUTEROL SULFATE HFA 108 (90 BASE) MCG/ACT IN AERS: 2.0000 | INHALATION_SPRAY | RESPIRATORY_TRACT | 1 refills | Status: DC | PRN

## 2022-02-10 NOTE — Progress Notes (Signed)
? ?[address], [place] [address] ?Dept: [phone] ? ?FOLLOW UP NOTE ? ?[name], female    DOB: Feb 18, 1961  Age: 61 y.o. MRN: [id number] ?Date of Office Visit: 02/10/2022 ? ?Assessment  ?Chief Complaint: Cough (She states that her cough has decreased and she has had no issues. ) ? ?HPI ?Kendra Hanson is a 61 year old female who presents today for follow-up of chronic cough, nonallergic rhinitis, and gastroesophageal reflux disease.  She was last seen on January 20, 2022 by Dr. Ernst Bowler.  Since her last office visit she denies any new diagnosis or surgeries.  She does report that she is wearing her CPAP now approximately 3 to 4 hours a day. ? ?Chronic cough is reported as so much better with Breztri 2 puffs twice a day with spacer.  She does not have an albuterol inhaler.  She reports that her cough is now occasional as to where she was coughing with every breath.  She also reports a little bit of shortness of breath with walking.  She denies fever, chills, wheeze, tightness in her chest, and nocturnal awakenings due to breathing problems.  Since her last office visit she has not required any systemic steroids or made any trips to the emergency room or urgent care due to breathing problems. ? ?Nonallergic rhinitis is reported as controlled with no medications at this time.  She reports rhinorrhea at times and nasal congestion every now and then.  She denies postnasal drip.  She has not had any sinus infections since we last saw her.  She did not ever receive a prescription for Atrovent 0.03% nasal spray.  Instructed her that since she was no longer having issues with a cough that I am okay with her not having this nasal spray.  She will call us if she would like a prescription. ? ?Gastroesophageal reflux disease is reported as controlled with Protonix once a day and Pepcid 40 mg twice a day.  She denies any heartburn or reflux symptoms. ? ? ?Drug Allergies:  ?Allergies   ?Allergen Reactions  ? Nsaids   ?  Kidney function ?  ? Diclofenac Other (See Comments)  ?  hallucination  ? Oxycodone   ?  Vomiting   ? Other Itching  ? ? ?Review of Systems: ?Review of Systems  ?Constitutional:  Negative for chills and fever.  ?HENT:    ?     Reports rhinorrhea at times and nasal congestion every now and then.  Denies postnasal drip  ?Eyes:   ?     Denies itchy watery eyes  ?Respiratory:  Positive for cough and shortness of breath. Negative for wheezing.   ?     Reports cough that is occasional and a little bit of shortness of breath with walking.  Denies wheezing, tightness in her chest, and nocturnal awakenings due to breathing problems.  ?Cardiovascular:  Negative for chest pain and palpitations.  ?Gastrointestinal:   ?     Denies heartburn or reflux symptoms  ?Genitourinary:  Positive for frequency.  ?     Reports increased frequency of urination, but drinks a lot of fluids  ?Skin:  Negative for itching and rash.  ?Neurological:  Negative for headaches.  ?Endo/Heme/Allergies:  Negative for environmental allergies.  ? ? ?Physical Exam: ?BP 140/82   Pulse 99   Temp (!) 97.5 ?F (36.4 ?C) (Temporal)   Resp 20   Ht '5\' 5"'$  (1.651 m)   Wt (!) 323 lb (146.5 kg)  SpO2 98%   BMI 53.75 kg/m?   ? ?Physical Exam ?Constitutional:   ?   Appearance: Normal appearance.  ?HENT:  ?   Head: Normocephalic and atraumatic.  ?   Comments: Pharynx normal, eyes normal, ears normal, nose: Bilateral lower turbinates mildly edematous and slightly erythematous with no drainage noted ?   Right Ear: Tympanic membrane, ear canal and external ear normal.  ?   Left Ear: Tympanic membrane, ear canal and external ear normal.  ?   Mouth/Throat:  ?   Mouth: Mucous membranes are moist.  ?   Pharynx: Oropharynx is clear.  ?Eyes:  ?   Conjunctiva/sclera: Conjunctivae normal.  ?Cardiovascular:  ?   Rate and Rhythm: Regular rhythm.  ?   Heart sounds: Normal heart sounds.  ?Pulmonary:  ?   Effort: Pulmonary effort is normal.  ?    Breath sounds: Normal breath sounds.  ?   Comments: Lungs clear to auscultation ?Musculoskeletal:  ?   Cervical back: Neck supple.  ?Skin: ?   General: Skin is warm.  ?Neurological:  ?   Mental Status: She is alert and oriented to person, place, and time.  ?Psychiatric:     ?   Mood and Affect: Mood normal.     ?   Behavior: Behavior normal.     ?   Thought Content: Thought content normal.     ?   Judgment: Judgment normal.  ? ? ?Diagnostics: ?FVC 1.82 L (66%), FEV1 1.35 L (62%).  Predicted FVC 2.75 L, predicted FEV1 2.17 L.  Spirometry indicates possibly moderate restriction.  Nutri is improved from previous spirometry ? ?Assessment and Plan: ?1. Chronic cough   ?2. Gastroesophageal reflux disease, unspecified whether esophagitis present   ?3. Non-allergic rhinitis   ? ? ?Meds ordered this encounter  ?Medications  ? fluticasone-salmeterol (ADVAIR HFA) 115-21 MCG/ACT inhaler  ?  Sig: Inhale 2 puffs into the lungs 2 (two) times daily.  ?  Dispense:  12 g  ?  Refill:  5  ? albuterol (VENTOLIN HFA) 108 (90 Base) MCG/ACT inhaler  ?  Sig: Inhale 2 puffs into the lungs every 4 (four) hours as needed for wheezing or shortness of breath.  ?  Dispense:  18 g  ?  Refill:  1  ? ? ?Patient Instructions  ?1. Chronic cough. ?Stop Breztri ?- Daily controller medication(s): Start Advair HFA 115/21 mcg two puffs twice daily with spacer.  Make sure and rinse your mouth out afterwards ?- Prior to physical activity: albuterol 2 puffs 10-15 minutes before physical activity. ?- Rescue medications: albuterol 4 puffs every 4-6 hours as needed ?- Asthma control goals:  ?* Full participation in all desired activities (may need albuterol before activity) ?* Albuterol use two time or less a week on average (not counting use with activity) ?* Cough interfering with sleep two time or less a month ?* Oral steroids no more than once a year ?* No hospitalizations ? ?2. Chronic rhinitis (she did not ever get a prescription for ipratropium bromide  nasal spray so we will hold off on sending this in since her cough is better) ?- Testing on03/08/2022 showed: NEGATIVE to the entire panel, so this is likely NOT allergies.  ?- You can use an extra dose of the antihistamine, if needed, for breakthrough symptoms.  ?- Consider nasal saline rinses 1-2 times daily to remove allergens from the nasal cavities as well as help with mucous clearance (this is especially helpful to do before the nasal  sprays are given) ? ?3. Gastroesophageal reflux disease ?- Continue  Protonix and use consistently ?- Continue Pepcid (famotidine) '40mg'$  twice daily  ? ?4. Schedule a follow up appointment in 2-3 months ? ? ? ? ? ? ? ? ? ?Return in about 3 months (around 05/12/2022), or if symptoms worsen or fail to improve. ?  ? ?Thank you for the opportunity to care for this patient.  Please do not hesitate to contact me with questions. ? ?Althea Charon, FNP ?[facility] ? ? ? ? ?

## 2022-02-14 ENCOUNTER — Telehealth: Payer: Self-pay

## 2022-02-14 NOTE — Telephone Encounter (Signed)
Received confirmation that the fax went through.

## 2022-02-14 NOTE — Telephone Encounter (Signed)
Faxed an updated form to [employer] Patient Assistance Program at [phone]. ?Added Novolog Mix 70/30 along to the original order of Ozempic '2mg'$ . ?

## 2022-02-15 ENCOUNTER — Telehealth: Payer: Self-pay

## 2022-02-15 DIAGNOSIS — N189 Chronic kidney disease, unspecified: Secondary | ICD-10-CM | POA: Diagnosis not present

## 2022-02-15 DIAGNOSIS — E1129 Type 2 diabetes mellitus with other diabetic kidney complication: Secondary | ICD-10-CM | POA: Diagnosis not present

## 2022-02-15 DIAGNOSIS — R809 Proteinuria, unspecified: Secondary | ICD-10-CM | POA: Diagnosis not present

## 2022-02-15 DIAGNOSIS — E1122 Type 2 diabetes mellitus with diabetic chronic kidney disease: Secondary | ICD-10-CM | POA: Diagnosis not present

## 2022-02-15 DIAGNOSIS — I129 Hypertensive chronic kidney disease with stage 1 through stage 4 chronic kidney disease, or unspecified chronic kidney disease: Secondary | ICD-10-CM | POA: Diagnosis not present

## 2022-02-15 NOTE — Telephone Encounter (Signed)
Patient called need lab order sent to [facility] for Cholesterol lab work. ?

## 2022-02-15 NOTE — Telephone Encounter (Signed)
[  2:00 PM] Hudy, Brandi ?its too early to do cholesterol  ? ?[2:00 PM] Hudy, Brandi ?she doesnt have any orders ? ?[2:01 PM] Hudy, Velna Hatchet ? ?last done 1/20 ? ?can't repeat until 4/21 or after  ? ? ? ? ?

## 2022-02-20 ENCOUNTER — Telehealth: Payer: Self-pay

## 2022-02-20 NOTE — Telephone Encounter (Signed)
[place] Patient Assistance Program paperwork to [employer] at [phone]. ?

## 2022-02-21 DIAGNOSIS — E1129 Type 2 diabetes mellitus with other diabetic kidney complication: Secondary | ICD-10-CM | POA: Diagnosis not present

## 2022-02-21 DIAGNOSIS — R809 Proteinuria, unspecified: Secondary | ICD-10-CM | POA: Diagnosis not present

## 2022-02-21 DIAGNOSIS — E1122 Type 2 diabetes mellitus with diabetic chronic kidney disease: Secondary | ICD-10-CM | POA: Diagnosis not present

## 2022-02-21 DIAGNOSIS — N189 Chronic kidney disease, unspecified: Secondary | ICD-10-CM | POA: Diagnosis not present

## 2022-02-21 DIAGNOSIS — N179 Acute kidney failure, unspecified: Secondary | ICD-10-CM | POA: Diagnosis not present

## 2022-02-21 NOTE — Telephone Encounter (Signed)
Received another fax and have sent an updated fax to [employer] Patient Assistance program for her Novofine 32G pen needles, Novolog Mix 70/30, and Ozempic '2mg'$ . Faxed to [phone] and received a confirmation fax that this went through. ?

## 2022-02-22 ENCOUNTER — Ambulatory Visit (INDEPENDENT_AMBULATORY_CARE_PROVIDER_SITE_OTHER): Payer: Medicare Other

## 2022-02-22 DIAGNOSIS — I5032 Chronic diastolic (congestive) heart failure: Secondary | ICD-10-CM | POA: Diagnosis not present

## 2022-02-22 DIAGNOSIS — E1129 Type 2 diabetes mellitus with other diabetic kidney complication: Secondary | ICD-10-CM | POA: Diagnosis not present

## 2022-02-22 DIAGNOSIS — E211 Secondary hyperparathyroidism, not elsewhere classified: Secondary | ICD-10-CM | POA: Diagnosis not present

## 2022-02-22 DIAGNOSIS — R809 Proteinuria, unspecified: Secondary | ICD-10-CM | POA: Diagnosis not present

## 2022-02-22 DIAGNOSIS — E1149 Type 2 diabetes mellitus with other diabetic neurological complication: Secondary | ICD-10-CM | POA: Diagnosis not present

## 2022-02-22 DIAGNOSIS — N170 Acute kidney failure with tubular necrosis: Secondary | ICD-10-CM | POA: Diagnosis not present

## 2022-02-22 DIAGNOSIS — I129 Hypertensive chronic kidney disease with stage 1 through stage 4 chronic kidney disease, or unspecified chronic kidney disease: Secondary | ICD-10-CM | POA: Diagnosis not present

## 2022-02-22 DIAGNOSIS — M722 Plantar fascial fibromatosis: Secondary | ICD-10-CM

## 2022-02-22 DIAGNOSIS — N189 Chronic kidney disease, unspecified: Secondary | ICD-10-CM | POA: Diagnosis not present

## 2022-02-22 DIAGNOSIS — E1122 Type 2 diabetes mellitus with diabetic chronic kidney disease: Secondary | ICD-10-CM | POA: Diagnosis not present

## 2022-02-22 DIAGNOSIS — L84 Corns and callosities: Secondary | ICD-10-CM

## 2022-02-22 DIAGNOSIS — E1140 Type 2 diabetes mellitus with diabetic neuropathy, unspecified: Secondary | ICD-10-CM

## 2022-02-22 NOTE — Progress Notes (Signed)
SITUATION ?Reason for Visit: Fitting of Diabetic [place] ?Patient / Caregiver Report:  Patient is satisfied with fit and function of shoes and insoles. ? ?OBJECTIVE DATA: ?Patient History / Diagnosis:   ?  ICD-10-CM   ?1. Diabetic neuropathy with neurologic complication (HCC)  K93.26   ? E11.49   ?  ?2. Corns and callosities  L84   ?  ? ? ?Change in Status:   None ? ?ACTIONS PERFORMED: ?In-Person Delivery, patient was fit with: ?- 1x pair [id number] PDAC approved prefabricated Diabetic Shoes: Orthofeet [place] 821  Size 11XW ?- 3x pair [phone] PDAC approved vacuum formed custom diabetic insoles; [employer]: [id number] ? ?Shoes and insoles were verified for structural integrity and safety. Patient wore shoes and insoles in office. Skin was inspected and free of areas of concern after wearing shoes and inserts. Shoes and inserts fit properly. Patient / Caregiver provided with ferbal instruction and demonstration regarding donning, doffing, wear, care, proper fit, function, purpose, cleaning, and use of shoes and insoles ' and in all related precautions and risks and benefits regarding shoes and insoles. Patient / Caregiver was instructed to wear properly fitting socks with shoes at all times. Patient was also provided with verbal instruction regarding how to report any failures or malfunctions of shoes or inserts, and necessary follow up care. Patient / Caregiver was also instructed to contact physician regarding change in status that may affect function of shoes and inserts.  ? ?Patient / Caregiver verbalized undersatnding of instruction provided. Patient / Caregiver demonstrated independence with proper donning and doffing of shoes and inserts. ? ?PLAN ?Patient to follow with treating physician as recommended. Plan of care was discussed with and agreed upon by patient and/or caregiver. All questions were answered and concerns addressed. ? ?

## 2022-02-23 ENCOUNTER — Ambulatory Visit: Payer: Medicare Other

## 2022-02-23 DIAGNOSIS — E1140 Type 2 diabetes mellitus with diabetic neuropathy, unspecified: Secondary | ICD-10-CM

## 2022-02-23 DIAGNOSIS — L84 Corns and callosities: Secondary | ICD-10-CM

## 2022-02-23 NOTE — Progress Notes (Signed)
SITUATION ?Reason for Consult: Follow-up with diabetic shoes and insoles ?Patient / Caregiver Report: Patient reports the shoes are too tight and do not hold to her feet well ? ?OBJECTIVE DATA ?History / Diagnosis:  ?  ICD-10-CM   ?1. Diabetic neuropathy with neurologic complication (HCC)  C34.03   ? E11.49   ?  ?2. Corns and callosities  L84   ?  ? ? ?Change in Pathology: none ? ?ACTIONS PERFORMED ?Patient's equipment was checked for structural stability and fit. Patient wishes to return shoes and try sneakers as originally recommended. Re-measured patient and determined that sizing is accurate however per patient's request, ordered Orthofeet [place] [phone] All questions answered and concerns addressed. ? ?PLAN ?Patient to be contacted when new shoes ready. Plan of care discussed with and agreed upon by patient / caregiver. ? ?

## 2022-02-27 ENCOUNTER — Encounter: Payer: Self-pay | Admitting: Nurse Practitioner

## 2022-02-27 ENCOUNTER — Ambulatory Visit (INDEPENDENT_AMBULATORY_CARE_PROVIDER_SITE_OTHER): Payer: Medicare Other | Admitting: Nurse Practitioner

## 2022-02-27 VITALS — BP 145/65 | HR 103 | Ht 65.0 in | Wt 321.4 lb

## 2022-02-27 DIAGNOSIS — Z794 Long term (current) use of insulin: Secondary | ICD-10-CM

## 2022-02-27 DIAGNOSIS — E1122 Type 2 diabetes mellitus with diabetic chronic kidney disease: Secondary | ICD-10-CM | POA: Diagnosis not present

## 2022-02-27 DIAGNOSIS — E782 Mixed hyperlipidemia: Secondary | ICD-10-CM

## 2022-02-27 DIAGNOSIS — N1832 Chronic kidney disease, stage 3b: Secondary | ICD-10-CM

## 2022-02-27 DIAGNOSIS — E559 Vitamin D deficiency, unspecified: Secondary | ICD-10-CM

## 2022-02-27 DIAGNOSIS — I10 Essential (primary) hypertension: Secondary | ICD-10-CM | POA: Diagnosis not present

## 2022-02-27 LAB — POCT GLYCOSYLATED HEMOGLOBIN (HGB A1C): HbA1c, POC (controlled diabetic range): 8.3 % — AB (ref 0.0–7.0)
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
## 2022-02-27 NOTE — Patient Instructions (Signed)
Diabetes Mellitus and Foot Care Foot care is an important part of your health, especially when you have diabetes. Diabetes may cause you to have problems because of poor blood flow (circulation) to your feet and legs, which can cause your skin to: Become thinner and drier. Break more easily. Heal more slowly. Peel and crack. You may also have nerve damage (neuropathy) in your legs and feet, causing decreased feeling in them. This means that you may not notice minor injuries to your feet that could lead to more serious problems. Noticing and addressing any potential problems early is the best way to prevent future foot problems. How to care for your feet Foot hygiene  Wash your feet daily with warm water and mild soap. Do not use hot water. Then, pat your feet and the areas between your toes until they are completely dry. Do not soak your feet as this can dry your skin. Trim your toenails straight across. Do not dig under them or around the cuticle. File the edges of your nails with an emery board or nail file. Apply a moisturizing lotion or petroleum jelly to the skin on your feet and to dry, brittle toenails. Use lotion that does not contain alcohol and is unscented. Do not apply lotion between your toes. Shoes and socks Wear clean socks or stockings every day. Make sure they are not too tight. Do not wear knee-high stockings since they may decrease blood flow to your legs. Wear shoes that fit properly and have enough cushioning. Always look in your shoes before you put them on to be sure there are no objects inside. To break in new shoes, wear them for just a few hours a day. This prevents injuries on your feet. Wounds, scrapes, corns, and calluses  Check your feet daily for blisters, cuts, bruises, sores, and redness. If you cannot see the bottom of your feet, use a mirror or ask someone for help. Do not cut corns or calluses or try to remove them with medicine. If you find a minor scrape,  cut, or break in the skin on your feet, keep it and the skin around it clean and dry. You may clean these areas with mild soap and water. Do not clean the area with peroxide, alcohol, or iodine. If you have a wound, scrape, corn, or callus on your foot, look at it several times a day to make sure it is healing and not infected. Check for: Redness, swelling, or pain. Fluid or blood. Warmth. Pus or a bad smell. General tips Do not cross your legs. This may decrease blood flow to your feet. Do not use heating pads or hot water bottles on your feet. They may burn your skin. If you have lost feeling in your feet or legs, you may not know this is happening until it is too late. Protect your feet from hot and cold by wearing shoes, such as at the beach or on hot pavement. Schedule a complete foot exam at least once a year (annually) or more often if you have foot problems. Report any cuts, sores, or bruises to your health care provider immediately. Where to find more information American Diabetes Association: www.diabetes.org Association of Diabetes Care & Education Specialists: www.diabeteseducator.org Contact a health care provider if: You have a medical condition that increases your risk of infection and you have any cuts, sores, or bruises on your feet. You have an injury that is not healing. You have redness on your legs or feet. You   feel burning or tingling in your legs or feet. You have pain or cramps in your legs and feet. Your legs or feet are numb. Your feet always feel cold. You have pain around any toenails. Get help right away if: You have a wound, scrape, corn, or callus on your foot and: You have pain, swelling, or redness that gets worse. You have fluid or blood coming from the wound, scrape, corn, or callus. Your wound, scrape, corn, or callus feels warm to the touch. You have pus or a bad smell coming from the wound, scrape, corn, or callus. You have a fever. You have a red  line going up your leg. Summary Check your feet every day for blisters, cuts, bruises, sores, and redness. Apply a moisturizing lotion or petroleum jelly to the skin on your feet and to dry, brittle toenails. Wear shoes that fit properly and have enough cushioning. If you have foot problems, report any cuts, sores, or bruises to your health care provider immediately. Schedule a complete foot exam at least once a year (annually) or more often if you have foot problems. This information is not intended to replace advice given to you by your health care provider. Make sure you discuss any questions you have with your health care provider. Document Revised: 05/20/2020 Document Reviewed: 05/20/2020 Elsevier Patient Education  2023 [employer]  

## 2022-03-01 ENCOUNTER — Encounter: Payer: Self-pay | Admitting: Family

## 2022-03-01 ENCOUNTER — Ambulatory Visit (INDEPENDENT_AMBULATORY_CARE_PROVIDER_SITE_OTHER): Payer: Medicare Other | Admitting: Family

## 2022-03-01 VITALS — BP 130/86 | HR 83 | Temp 97.6°F | Resp 16 | Ht 65.0 in | Wt 320.4 lb

## 2022-03-01 DIAGNOSIS — K219 Gastro-esophageal reflux disease without esophagitis: Secondary | ICD-10-CM | POA: Diagnosis not present

## 2022-03-01 DIAGNOSIS — R0602 Shortness of breath: Secondary | ICD-10-CM

## 2022-03-01 DIAGNOSIS — J310 Chronic rhinitis: Secondary | ICD-10-CM

## 2022-03-01 DIAGNOSIS — R053 Chronic cough: Secondary | ICD-10-CM

## 2022-03-01 DIAGNOSIS — R062 Wheezing: Secondary | ICD-10-CM

## 2022-03-01 MED ORDER — BREZTRI AEROSPHERE 160-9-4.8 MCG/ACT IN AERO: INHALATION_SPRAY | RESPIRATORY_TRACT | 5 refills | Status: DC

## 2022-03-01 MED ORDER — ALBUTEROL SULFATE HFA 108 (90 BASE) MCG/ACT IN AERS: 2.0000 | INHALATION_SPRAY | Freq: Four times a day (QID) | RESPIRATORY_TRACT | 1 refills | Status: DC | PRN

## 2022-03-01 NOTE — Patient Instructions (Addendum)
1. Chronic cough ?Stop Advair HFA 115/21 ?Since you are concerned about a blood clot we will check a D-dimer.  We will call you with results once they are back ?- Daily controller medication(s): re-start Breztri  two puffs twice daily with spacer.  Make sure and rinse your mouth out afterwards ?- Prior to physical activity: albuterol 2 puffs 10-15 minutes before physical activity. ?- Rescue medications: albuterol 4 puffs every 4-6 hours as needed ?- Asthma control goals:  ?* Full participation in all desired activities (may need albuterol before activity) ?* Albuterol use two time or less a week on average (not counting use with activity) ?* Cough interfering with sleep two time or less a month ?* Oral steroids no more than once a year ?* No hospitalizations ? ?2. Chronic rhinitis (she did not ever get a prescription for ipratropium bromide nasal spray so we will hold off on sending this in since her cough is better) ?- Testing on03/08/2022 showed: NEGATIVE to the entire panel, so this is likely NOT allergies.  ?- Consider nasal saline rinses 1-2 times daily to remove allergens from the nasal cavities as well as help with mucous clearance (this is especially helpful to do before the nasal sprays are given) ? ?3. Gastroesophageal reflux disease ?- Continue  Protonix and use consistently ?- Continue Pepcid (famotidine) '40mg'$  twice daily  ? ?4. Schedule a follow up appointment in 2-3 months or sooner if needed ? ? ? ? ? ? ? ? ?

## 2022-03-01 NOTE — Progress Notes (Addendum)
? ?[address], [place] [address] ?Dept: [phone] ? ?FOLLOW UP NOTE ? ?[name], female    DOB: 09-Feb-1961  Age: 61 y.o. MRN: [id number] ?Date of Office Visit: 03/01/2022 ? ?Assessment  ?Chief Complaint: Asthma (Shortness of breath, fatigue, increased chest palpitations , and glazed eyes. Yesterday took albuterol and advair at the same time caused jitters ) ? ?HPI ?Kendra Hanson is a 61 year old female who presents today for an acute visit of feeling tired, short of breath, winded, glazy eyes, and heart rate was 105 after using Advair and albuterol at the same time on Monday.  She was last seen on February 10, 2022 for chronic cough, gastroesophageal reflux disease, and nonallergic rhinitis.  Since her last office visit she denies any new diagnosis or surgeries.  She does report that her kidney specialist stopped her lisinopril due to her creatinine being too high.  She does mention now that her creatinine is back down. ? ?Chronic cough is reported as being better than what it was before.  She reports that she has a productive cough with clear sputum now and then but nothing like before.  She reports wheezing and she used her albuterol and this helps.  She also reports shortness of breath.  She denies tightness in her chest.  She denies any recent nocturnal awakenings due to breathing problems.  Since her last office visit she has not required any systemic steroids or made any trips to the emergency room or urgent care due to breathing problems.  She has been using her albuterol 2 times a day since she was switched to Advair HFA 115/21 mcg 2 puffs twice a day with spacer.  She does feel like her breathing is worse since her last appointment.  She wonders if she has a blood clot.  She denies any long distance travel, history of blood clots, or surgeries.  She reports that she always has bilateral lower leg edema. ? ?Chronic rhinitis is reported as moderately controlled.  She  reports that she had postnasal drip yesterday and denies rhinorrhea and nasal congestion.  She has not had any sinus infections since we last saw her. ? ?Gastroesophageal reflux disease is reported as controlled with Protonix and Pepcid 40 mg twice a day.  She reports that one of her physicians told her that Ozempic could cause reflux. ? ?She wonders if her glazy eyes could be caused from her CPAP mask.  She also reports that she has not been sleeping well.  Discussed with her if she continues to have problems with her eyes to schedule an appointment with her eye doctor. ? ? ?Drug Allergies:  ?Allergies  ?Allergen Reactions  ? Nsaids   ?  Kidney function ?  ? Diclofenac Other (See Comments)  ?  hallucination  ? Oxycodone   ?  Vomiting   ? Other Itching  ? ? ?Review of Systems: ?Review of Systems  ?Constitutional:  Positive for malaise/fatigue. Negative for chills and fever.  ?HENT:    ?     Reports postnasal drip today and denies rhinorrhea nasal congestion  ?Eyes:  Negative for pain and discharge.  ?     Denies changes in vision, eye pain, and discharge  ?Respiratory:  Positive for cough, shortness of breath and wheezing.   ?Cardiovascular:  Negative for chest pain.  ?     Reports that her heart rate went up to 105 after using Advair and albuterol at the same time.  ?  Gastrointestinal:   ?     Denies heartburn or reflux symptoms with Protonix and Pepcid  ?Skin:  Negative for itching and rash.  ?Neurological:  Negative for headaches.  ?Endo/Heme/Allergies:  Negative for environmental allergies.  ? ? ?Physical Exam: ?BP 130/86   Pulse 83   Temp 97.6 ?F (36.4 ?C)   Resp 16   Ht '5\' 5"'$  (1.651 m)   Wt (!) 320 lb 6.4 oz (145.3 kg)   SpO2 97%   BMI 53.32 kg/m?   ? ?Physical Exam ?Constitutional:   ?   Appearance: Normal appearance.  ?HENT:  ?   Head: Normocephalic and atraumatic.  ?   Comments: Pharynx normal, eyes normal, ears normal, nose normal ?   Right Ear: Tympanic membrane, ear canal and external ear normal.   ?   Left Ear: Tympanic membrane, ear canal and external ear normal.  ?   Nose: Nose normal.  ?   Mouth/Throat:  ?   Mouth: Mucous membranes are moist.  ?   Pharynx: Oropharynx is clear.  ?Eyes:  ?   Conjunctiva/sclera: Conjunctivae normal.  ?Cardiovascular:  ?   Rate and Rhythm: Normal rate and regular rhythm.  ?   Heart sounds: Normal heart sounds.  ?Pulmonary:  ?   Effort: Pulmonary effort is normal.  ?   Breath sounds: Normal breath sounds.  ?   Comments: Lungs clear to auscultation ?Musculoskeletal:  ?   Cervical back: Neck supple.  ?Skin: ?   General: Skin is warm.  ?Neurological:  ?   Mental Status: She is alert and oriented to person, place, and time.  ?Psychiatric:     ?   Mood and Affect: Mood normal.     ?   Behavior: Behavior normal.     ?   Thought Content: Thought content normal.     ?   Judgment: Judgment normal.  ? ? ?Diagnostics: ?FVC 1.71 L (62%), FEV1 1.32 L (61%).  Predicted FVC 2.75 L, predicted FEV1 2.17 L.  Spirometry indicates possible moderate restriction.  Postbronchodilator response shows FVC 1.69 L, FEV1 1.31 L.  There is no change in FEV1.  Spirometry indicates possible moderate restriction.  She reports feeling like she can breathe better after the 4 puffs of Xopenex were given. ? ?Assessment and Plan: ?1. Shortness of breath   ?2. Wheeze   ?3. Chronic cough   ?4. Non-allergic rhinitis   ?5. Gastroesophageal reflux disease, unspecified whether esophagitis present   ? ? ?Meds ordered this encounter  ?Medications  ? Budeson-Glycopyrrol-Formoterol (BREZTRI AEROSPHERE) 160-9-4.8 MCG/ACT AERO  ?  Sig: Inhale two puffs twice daily with spacer.  Make sure and rinse your mouth out afterwards  ?  Dispense:  10.7 g  ?  Refill:  5  ? albuterol (VENTOLIN HFA) 108 (90 Base) MCG/ACT inhaler  ?  Sig: Inhale 2 puffs into the lungs every 6 (six) hours as needed for wheezing or shortness of breath.  ?  Dispense:  18 g  ?  Refill:  1  ? ? ?Patient Instructions  ?1. Chronic cough ?Stop Advair HFA  115/21 ?Since you are concerned about a blood clot we will check a D-dimer.  We will call you with results once they are back ?- Daily controller medication(s): re-start Breztri  two puffs twice daily with spacer.  Make sure and rinse your mouth out afterwards ?- Prior to physical activity: albuterol 2 puffs 10-15 minutes before physical activity. ?- Rescue medications: albuterol 4 puffs every 4-6 hours  as needed ?- Asthma control goals:  ?* Full participation in all desired activities (may need albuterol before activity) ?* Albuterol use two time or less a week on average (not counting use with activity) ?* Cough interfering with sleep two time or less a month ?* Oral steroids no more than once a year ?* No hospitalizations ? ?2. Chronic rhinitis (she did not ever get a prescription for ipratropium bromide nasal spray so we will hold off on sending this in since her cough is better) ?- Testing on03/08/2022 showed: NEGATIVE to the entire panel, so this is likely NOT allergies.  ?- Consider nasal saline rinses 1-2 times daily to remove allergens from the nasal cavities as well as help with mucous clearance (this is especially helpful to do before the nasal sprays are given) ? ?3. Gastroesophageal reflux disease ?- Continue  Protonix and use consistently ?- Continue Pepcid (famotidine) '40mg'$  twice daily  ? ?4. Schedule a follow up appointment in 2-3 months or sooner if needed ? ? ? ? ? ? ? ? ? ?Return in about 3 months (around 05/31/2022), or if symptoms worsen or fail to improve. ?  ? ?Thank you for the opportunity to care for this patient.  Please do not hesitate to contact me with questions. ? ?Althea Charon, FNP ?[facility] ? ? ? ? ?

## 2022-03-02 ENCOUNTER — Emergency Department (HOSPITAL_COMMUNITY): Payer: Medicare Other

## 2022-03-02 ENCOUNTER — Encounter (HOSPITAL_COMMUNITY): Payer: Self-pay | Admitting: *Deleted

## 2022-03-02 ENCOUNTER — Emergency Department (HOSPITAL_COMMUNITY)
Admission: EM | Admit: 2022-03-02 | Discharge: 2022-03-02 | Disposition: A | Discharge status: Home / Self Care | Payer: Medicare Other | Attending: Emergency Medicine | Admitting: Emergency Medicine

## 2022-03-02 ENCOUNTER — Other Ambulatory Visit: Payer: Self-pay

## 2022-03-02 DIAGNOSIS — I129 Hypertensive chronic kidney disease with stage 1 through stage 4 chronic kidney disease, or unspecified chronic kidney disease: Secondary | ICD-10-CM | POA: Diagnosis not present

## 2022-03-02 DIAGNOSIS — J45909 Unspecified asthma, uncomplicated: Secondary | ICD-10-CM | POA: Diagnosis not present

## 2022-03-02 DIAGNOSIS — R000 Tachycardia, unspecified: Secondary | ICD-10-CM | POA: Diagnosis not present

## 2022-03-02 DIAGNOSIS — Z79899 Other long term (current) drug therapy: Secondary | ICD-10-CM | POA: Insufficient documentation

## 2022-03-02 DIAGNOSIS — N183 Chronic kidney disease, stage 3 unspecified: Secondary | ICD-10-CM | POA: Diagnosis not present

## 2022-03-02 DIAGNOSIS — R7989 Other specified abnormal findings of blood chemistry: Secondary | ICD-10-CM | POA: Diagnosis not present

## 2022-03-02 DIAGNOSIS — R0602 Shortness of breath: Secondary | ICD-10-CM | POA: Insufficient documentation

## 2022-03-02 LAB — BASIC METABOLIC PANEL
Anion gap: 9 (ref 5–15)
BUN: 41 mg/dL — ABNORMAL HIGH (ref 8–23)
CO2: 30 mmol/L (ref 22–32)
Calcium: 9.7 mg/dL (ref 8.9–10.3)
Chloride: 99 mmol/L (ref 98–111)
Creatinine, Ser: 2.35 mg/dL — ABNORMAL HIGH (ref 0.44–1.00)
GFR, Estimated: 23 mL/min — ABNORMAL LOW (ref >60)
Glucose, Bld: 134 mg/dL — ABNORMAL HIGH (ref 70–99)
Potassium: 4.2 mmol/L (ref 3.5–5.1)
Sodium: 138 mmol/L (ref 135–145)

## 2022-03-02 LAB — CBC WITH DIFFERENTIAL/PLATELET
Abs Immature Granulocytes: 0.35 10*3/uL — ABNORMAL HIGH (ref 0.00–0.07)
Basophils Absolute: 0.1 10*3/uL (ref 0.0–0.1)
Basophils Relative: 1 %
Eosinophils Absolute: 0.1 10*3/uL (ref 0.0–0.5)
Eosinophils Relative: 1 %
HCT: 40.6 % (ref 36.0–46.0)
Hemoglobin: 13.1 g/dL (ref 12.0–15.0)
Immature Granulocytes: 3 %
Lymphocytes Relative: 18 %
Lymphs Abs: 1.9 10*3/uL (ref 0.7–4.0)
MCH: 29.8 pg (ref 26.0–34.0)
MCHC: 32.3 g/dL (ref 30.0–36.0)
MCV: 92.3 fL (ref 80.0–100.0)
Monocytes Absolute: 0.8 10*3/uL (ref 0.1–1.0)
Monocytes Relative: 7 %
Neutro Abs: 7.5 10*3/uL (ref 1.7–7.7)
Neutrophils Relative %: 70 %
Platelets: 244 10*3/uL (ref 150–400)
RBC: 4.4 MIL/uL (ref 3.87–5.11)
RDW: 13.1 % (ref 11.5–15.5)
WBC: 10.7 10*3/uL — ABNORMAL HIGH (ref 4.0–10.5)
nRBC: 0 % (ref 0.0–0.2)

## 2022-03-02 LAB — D-DIMER, QUANTITATIVE: D-DIMER: 16.5 mg/L FEU — ABNORMAL HIGH (ref 0.00–0.49)

## 2022-03-02 MED ORDER — TECHNETIUM TO 99M ALBUMIN AGGREGATED
4.0000 | Freq: Once | INTRAVENOUS | Status: AC | PRN
  Administered 2022-03-02: 4.4 via INTRAVENOUS

## 2022-03-02 NOTE — Discharge Instructions (Signed)
Imaging today was negative for blood clot in your lungs.  This is great news.  Please follow-up with your primary care doctor for further evaluation.  Return to the emergency department for any worsening symptoms. ?

## 2022-03-02 NOTE — ED Triage Notes (Signed)
Pt c/o sob and went to see her PCP and had lab work yesterday and the office called her today to come to ED due to elevated d-dimer ?

## 2022-03-02 NOTE — ED Provider Notes (Signed)
?[facility] ?Provider Note ? ? ?CSN: [id number] ?Arrival date & time: 03/02/22  1344 ? ?  ? ?History ?Chief Complaint  ?Patient presents with  ? Shortness of Breath  ? ? ?Kendra Hanson is a 61 y.o. female with history of hypertension, hyperlipidemia, asthma, GERD, and chronic kidney disease who presents to the emergency department with elevated D-dimer.  Patient was seen evaluated by her primary care doctor yesterday complaining of shortness of breath over 2-day duration.  She had a medication adjustments including albuterol and Breztri inhalers at that time.  Patient expressed to her primary care doctor that she was concerned she might have a blood clot blood work was promptly drawn.  Her D-dimer came back above 12 today and was sent to the emergency department to rule out pulmonary embolism.  Currently, patient is completely asymptomatic and states that she has no shortness of breath.  She has never had any chest pain, leg swelling, history of cancer, long car rides.  She denies syncope or near syncope symptoms. ? ? ?Shortness of Breath ? ?  ? ?Home Medications ?Prior to Admission medications   ?Medication Sig Start Date End Date Taking? Authorizing Provider  ?ACCU-CHEK GUIDE test strip USE TO TEST BLOOD SUGAR THREE TIMES DAILY AS DIRECTED. 11/15/21   Jessy Oto, MD  ?albuterol (VENTOLIN HFA) 108 (90 Base) MCG/ACT inhaler Inhale 2 puffs into the lungs every 6 (six) hours as needed for wheezing or shortness of breath. 03/01/22   Althea Charon, [place]  ?Blood Glucose Monitoring Suppl (ACCU-CHEK GUIDE ME) w/Device KIT 1 Piece by Does not apply route as directed. 06/16/19   Cassandria Anger, MD  ?Budeson-Glycopyrrol-Formoterol (BREZTRI AEROSPHERE) 160-9-4.8 MCG/ACT AERO Inhale two puffs twice daily with spacer.  Make sure and rinse your mouth out afterwards 03/01/22   Althea Charon, FNP  ?calcitRIOL (ROCALTROL) 0.25 MCG capsule Take 0.25 mcg by mouth 3 (three) times a week. 11/24/21    [provider]  ?cholecalciferol (VITAMIN D3) 25 MCG (1000 UNIT) tablet Take 1,000 Units by mouth daily.    [provider]  ?ezetimibe (ZETIA) 10 MG tablet Take 1 tablet (10 mg total) by mouth daily. 09/14/21 02/10/22  Geralynn Rile, MD  ?famotidine (PEPCID) 40 MG tablet Take 1 tablet (40 mg total) by mouth 2 (two) times daily. 01/23/22   Valentina Shaggy, MD  ?fenofibrate (TRICOR) 145 MG tablet Take 1 tablet (145 mg total) by mouth daily. 12/09/21   Fayrene Helper, MD  ?GLOBAL EASE INJECT PEN NEEDLES 31G X 8 MM MISC USE UP TO 5 TIMES A DAY AS DIRECTED. 05/13/19   Perlie Mayo, NP  ?insulin isophane & regular human KwikPen (HUMULIN 70/30 KWIKPEN) (70-30) 100 UNIT/ML KwikPen Inject 40-50 Units into the skin 2 (two) times daily with a meal. 08/29/21   Brita Romp, NP  ?lisinopril (ZESTRIL) 2.5 MG tablet Take by mouth. 09/07/21 09/07/22  [provider]  ?mometasone (NASONEX) 50 MCG/ACT nasal spray Place 2 sprays into the nose daily. 12/28/20   Fayrene Helper, MD  ?montelukast (SINGULAIR) 10 MG tablet Take 10 mg by mouth at bedtime. 12/01/21   [provider]  ?neomycin-polymyxin b-dexamethasone ([place]) 3.5-10000-0.1 Blunt  02/15/21   [provider]  ?ondansetron (ZOFRAN) 4 MG tablet Take 1 tablet (4 mg total) by mouth 2 (two) times daily as needed for nausea or vomiting. 01/21/16   Rehman, Mechele Dawley, MD  ?pantoprazole (PROTONIX) 20 MG tablet Take 1 tablet (20 mg total)  by mouth daily. 01/23/22   Valentina Shaggy, MD  ?potassium chloride (KLOR-CON) 10 MEQ tablet TAKE ONE TABLET BY MOUTH 3 TIMES A DAY 08/16/21   Fayrene Helper, MD  ?Semaglutide, 2 MG/DOSE, 8 MG/3ML SOPN Inject 2 mg as directed once a week. 12/21/21   Brita Romp, NP  ?simethicone (MYLICON) 80 MG chewable tablet Chew 80 mg by mouth every 6 (six) hours as needed for flatulence.    [provider]  ?torsemide (DEMADEX) 20 MG tablet Take 1 tablet (20 mg total) by  mouth 2 (two) times daily. 09/28/20   Fayrene Helper, MD  ?   ? ?Allergies    ?Nsaids, Diclofenac, Oxycodone, and Other   ? ?Review of Systems   ?Review of Systems  ?Respiratory:  Positive for shortness of breath.   ?All other systems reviewed and are negative. ? ?Physical Exam ?Updated Vital Signs ?BP 121/90   Pulse 94   Temp 98.1 ?F (36.7 ?C) (Oral)   Resp (!) 28   Ht 5' 5"  (1.651 m)   Wt (!) 145 kg   SpO2 100%   BMI 53.20 kg/m?  ?Physical Exam ?Vitals and nursing note reviewed.  ?Constitutional:   ?   General: She is not in acute distress. ?   Appearance: Normal appearance.  ?HENT:  ?   Head: Normocephalic and atraumatic.  ?Eyes:  ?   General:     ?   Right eye: No discharge.     ?   Left eye: No discharge.  ?Cardiovascular:  ?   Rate and Rhythm: Tachycardia present.  ?   Comments:  S1/S2 are distinct without any evidence of murmur, rubs, or gallops.  Radial pulses are 2+ bilaterally.  Dorsalis pedis pulses are 2+ bilaterally.  No evidence of pedal edema. ?Pulmonary:  ?   Comments: Clear to auscultation bilaterally.  Normal effort.  No respiratory distress.  No evidence of wheezes, rales, or rhonchi heard throughout. ?Abdominal:  ?   General: Abdomen is flat. Bowel sounds are normal. There is no distension.  ?   Tenderness: There is no abdominal tenderness. There is no guarding or rebound.  ?Musculoskeletal:     ?   General: Normal range of motion.  ?   Cervical back: Neck supple.  ?Skin: ?   General: Skin is warm and dry.  ?   Findings: No rash.  ?Neurological:  ?   General: No focal deficit present.  ?   Mental Status: She is alert.  ?Psychiatric:     ?   Mood and Affect: Mood normal.     ?   Behavior: Behavior normal.  ? ? ?ED Results / Procedures / Treatments   ?Labs ?(all labs ordered are listed, but only abnormal results are displayed) ?Labs Reviewed  ?CBC WITH DIFFERENTIAL/PLATELET - Abnormal; Notable for the following components:  ?    Result Value  ? WBC 10.7 (*)   ? Abs Immature Granulocytes  0.35 (*)   ? All other components within normal limits  ?BASIC METABOLIC PANEL - Abnormal; Notable for the following components:  ? Glucose, Bld 134 (*)   ? BUN 41 (*)   ? Creatinine, Ser 2.35 (*)   ? GFR, Estimated 23 (*)   ? All other components within normal limits  ? ? ?EKG ?None ? ?Radiology ?DG Chest 2 View ? ?Result Date: 03/02/2022 ?CLINICAL DATA:  Short of breath EXAM: CHEST - 2 VIEW COMPARISON:  May 2022 FINDINGS: The heart size  and mediastinal contours are within normal limits. Both lungs are clear. No pleural effusion or pneumothorax. The visualized skeletal structures are unremarkable. IMPRESSION: No acute process in the chest. Electronically Signed   By: Macy Mis M.D.   On: 03/02/2022 19:09  ? ?NM Pulmonary Perfusion ? ?Result Date: 03/02/2022 ?CLINICAL DATA:  Shortness of breath and elevated D-dimer. EXAM: NUCLEAR MEDICINE PERFUSION LUNG SCAN TECHNIQUE: Perfusion images were obtained in multiple projections after intravenous injection of radiopharmaceutical. Ventilation scans intentionally deferred if perfusion scan and chest x-ray adequate for interpretation during COVID 19 epidemic. RADIOPHARMACEUTICALS:  4.4 mCi Tc-41mMAA IV COMPARISON:  None. FINDINGS: Predominant homogeneous distribution of tracer activity is seen throughout both lungs. No segmental or subsegmental perfusion defects are identified. IMPRESSION: Normal nuclear medicine perfusion lung scan. Electronically Signed   By: TVirgina NorfolkM.D.   On: 03/02/2022 18:29   ? ?Procedures ?Procedures  ? ? ?Medications Ordered in ED ?Medications  ?technetium albumin aggregated (MAA) injection solution 4 millicurie (4.4 millicuries Intravenous Contrast Given 03/02/22 1755)  ? ? ?ED Course/ Medical Decision Making/ A&P ?Clinical Course as of 03/02/22 1914  ?Thu Mar 02, 2022  ?153Spoke with nuclear medicine and they stated that they will have to call [facility] to get the scan done but they should be able to get it done tonight. [CF]  ?   ?Clinical Course User Index ?[CF] FMyna BrightM, PA-C  ? ?                        ?Medical Decision Making ?Amount and/or Complexity of Data Reviewed ?Labs: ordered. ?Radiology: ordered. ? ? ?This patient presents t

## 2022-03-02 NOTE — Progress Notes (Signed)
Called and spoke with Kendra Hanson and explained that her d-dimer was very elevated and that she needed to go to the Emergency Room now for  further evaluation. She verbalizes understanding.

## 2022-03-03 ENCOUNTER — Encounter: Payer: Self-pay | Admitting: Allergy & Immunology

## 2022-03-03 ENCOUNTER — Other Ambulatory Visit: Payer: Self-pay | Admitting: Family Medicine

## 2022-03-03 ENCOUNTER — Telehealth: Payer: Self-pay | Admitting: *Deleted

## 2022-03-03 DIAGNOSIS — E876 Hypokalemia: Secondary | ICD-10-CM

## 2022-03-03 NOTE — Telephone Encounter (Signed)
Error

## 2022-03-03 NOTE — Telephone Encounter (Signed)
I called and spoke with the patient, she stated that everything went well at the ER and they did not find any blood clots following the elevated D Dimer results. They instructed her to follow up with her PCP, our office, and her Nephrologist. They did additional labs and noticed that she elevated creatinine. She wanted me to let her Nephrologist know. Did you have any further recommendation for now, Chrissie? She has a follow up appointment scheduled with you in June, she stated that she is feeling much better.  ?

## 2022-03-03 NOTE — Telephone Encounter (Signed)
Great! Thank you! We spoke on the phone earlier this morning. Have her continue the plan we discussed on Wednesday and let us know if she has any problems.

## 2022-03-04 DIAGNOSIS — N189 Chronic kidney disease, unspecified: Secondary | ICD-10-CM | POA: Diagnosis not present

## 2022-03-04 DIAGNOSIS — E1122 Type 2 diabetes mellitus with diabetic chronic kidney disease: Secondary | ICD-10-CM | POA: Diagnosis not present

## 2022-03-04 DIAGNOSIS — I5032 Chronic diastolic (congestive) heart failure: Secondary | ICD-10-CM | POA: Diagnosis not present

## 2022-03-04 DIAGNOSIS — N170 Acute kidney failure with tubular necrosis: Secondary | ICD-10-CM | POA: Diagnosis not present

## 2022-03-04 DIAGNOSIS — I129 Hypertensive chronic kidney disease with stage 1 through stage 4 chronic kidney disease, or unspecified chronic kidney disease: Secondary | ICD-10-CM | POA: Diagnosis not present

## 2022-03-04 DIAGNOSIS — R809 Proteinuria, unspecified: Secondary | ICD-10-CM | POA: Diagnosis not present

## 2022-03-04 DIAGNOSIS — E1129 Type 2 diabetes mellitus with other diabetic kidney complication: Secondary | ICD-10-CM | POA: Diagnosis not present

## 2022-03-06 ENCOUNTER — Other Ambulatory Visit (HOSPITAL_COMMUNITY): Payer: Self-pay | Admitting: Nephrology

## 2022-03-06 ENCOUNTER — Other Ambulatory Visit: Payer: Self-pay | Admitting: Nephrology

## 2022-03-06 ENCOUNTER — Other Ambulatory Visit: Payer: Self-pay

## 2022-03-06 DIAGNOSIS — N170 Acute kidney failure with tubular necrosis: Secondary | ICD-10-CM

## 2022-03-06 DIAGNOSIS — E1129 Type 2 diabetes mellitus with other diabetic kidney complication: Secondary | ICD-10-CM

## 2022-03-06 DIAGNOSIS — E785 Hyperlipidemia, unspecified: Secondary | ICD-10-CM

## 2022-03-06 DIAGNOSIS — I129 Hypertensive chronic kidney disease with stage 1 through stage 4 chronic kidney disease, or unspecified chronic kidney disease: Secondary | ICD-10-CM | POA: Diagnosis not present

## 2022-03-06 DIAGNOSIS — N189 Chronic kidney disease, unspecified: Secondary | ICD-10-CM | POA: Diagnosis not present

## 2022-03-06 DIAGNOSIS — I5032 Chronic diastolic (congestive) heart failure: Secondary | ICD-10-CM

## 2022-03-06 DIAGNOSIS — E1122 Type 2 diabetes mellitus with diabetic chronic kidney disease: Secondary | ICD-10-CM

## 2022-03-06 DIAGNOSIS — R809 Proteinuria, unspecified: Secondary | ICD-10-CM | POA: Diagnosis not present

## 2022-03-08 LAB — CBC
Hematocrit: 37 % (ref 34.0–46.6)
Hemoglobin: 12.9 g/dL (ref 11.1–15.9)
MCH: 30.4 pg (ref 26.6–33.0)
MCHC: 34.9 g/dL (ref 31.5–35.7)
MCV: 87 fL (ref 79–97)
Platelets: 245 10*3/uL (ref 150–450)
RBC: 4.24 x10E6/uL (ref 3.77–5.28)
RDW: 13.1 % (ref 11.7–15.4)
WBC: 9.6 10*3/uL (ref 3.4–10.8)

## 2022-03-08 LAB — LIPID PANEL
Chol/HDL Ratio: 3.1 ratio (ref 0.0–4.4)
Cholesterol, Total: 267 mg/dL — ABNORMAL HIGH (ref 100–199)
HDL: 85 mg/dL (ref >39)
LDL Chol Calc (NIH): 166 mg/dL — ABNORMAL HIGH (ref 0–99)
Triglycerides: 95 mg/dL (ref 0–149)
VLDL Cholesterol Cal: 16 mg/dL (ref 5–40)

## 2022-03-10 ENCOUNTER — Ambulatory Visit (HOSPITAL_COMMUNITY)
Admission: RE | Admit: 2022-03-10 | Discharge: 2022-03-10 | Disposition: A | Discharge status: Home / Self Care | Payer: Medicare Other | Source: Ambulatory Visit | Attending: Nephrology | Admitting: Nephrology

## 2022-03-10 DIAGNOSIS — N179 Acute kidney failure, unspecified: Secondary | ICD-10-CM | POA: Diagnosis not present

## 2022-03-10 DIAGNOSIS — N170 Acute kidney failure with tubular necrosis: Secondary | ICD-10-CM

## 2022-03-10 DIAGNOSIS — E1129 Type 2 diabetes mellitus with other diabetic kidney complication: Secondary | ICD-10-CM | POA: Insufficient documentation

## 2022-03-10 DIAGNOSIS — R809 Proteinuria, unspecified: Secondary | ICD-10-CM | POA: Diagnosis not present

## 2022-03-10 DIAGNOSIS — I5032 Chronic diastolic (congestive) heart failure: Secondary | ICD-10-CM | POA: Diagnosis not present

## 2022-03-10 DIAGNOSIS — E1122 Type 2 diabetes mellitus with diabetic chronic kidney disease: Secondary | ICD-10-CM | POA: Diagnosis not present

## 2022-03-10 DIAGNOSIS — I129 Hypertensive chronic kidney disease with stage 1 through stage 4 chronic kidney disease, or unspecified chronic kidney disease: Secondary | ICD-10-CM | POA: Diagnosis not present

## 2022-03-15 ENCOUNTER — Telehealth: Payer: Self-pay | Admitting: Nurse Practitioner

## 2022-03-15 NOTE — Telephone Encounter (Signed)
Received Novolog flexpen medication from patient assistance, tried to contact pt no answer. ?

## 2022-03-15 NOTE — Telephone Encounter (Signed)
Pt called back and confirmed she will pick up  ?

## 2022-03-16 NOTE — Telephone Encounter (Signed)
Pt's husband picked up her medications ?

## 2022-03-20 ENCOUNTER — Encounter: Payer: Self-pay | Admitting: Cardiovascular Disease

## 2022-03-20 ENCOUNTER — Ambulatory Visit (INDEPENDENT_AMBULATORY_CARE_PROVIDER_SITE_OTHER): Payer: Medicare Other | Admitting: Cardiovascular Disease

## 2022-03-20 VITALS — BP 144/68 | HR 83 | Ht 65.0 in | Wt 322.2 lb

## 2022-03-20 DIAGNOSIS — I5032 Chronic diastolic (congestive) heart failure: Secondary | ICD-10-CM | POA: Diagnosis not present

## 2022-03-20 DIAGNOSIS — N170 Acute kidney failure with tubular necrosis: Secondary | ICD-10-CM | POA: Diagnosis not present

## 2022-03-20 DIAGNOSIS — R002 Palpitations: Secondary | ICD-10-CM

## 2022-03-20 DIAGNOSIS — R809 Proteinuria, unspecified: Secondary | ICD-10-CM | POA: Diagnosis not present

## 2022-03-20 DIAGNOSIS — R0602 Shortness of breath: Secondary | ICD-10-CM | POA: Diagnosis not present

## 2022-03-20 DIAGNOSIS — N189 Chronic kidney disease, unspecified: Secondary | ICD-10-CM | POA: Diagnosis not present

## 2022-03-20 DIAGNOSIS — I10 Essential (primary) hypertension: Secondary | ICD-10-CM

## 2022-03-20 DIAGNOSIS — E1122 Type 2 diabetes mellitus with diabetic chronic kidney disease: Secondary | ICD-10-CM | POA: Diagnosis not present

## 2022-03-20 DIAGNOSIS — E1129 Type 2 diabetes mellitus with other diabetic kidney complication: Secondary | ICD-10-CM | POA: Diagnosis not present

## 2022-03-20 DIAGNOSIS — E782 Mixed hyperlipidemia: Secondary | ICD-10-CM | POA: Diagnosis not present
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
## 2022-03-20 NOTE — Patient Instructions (Signed)
Medication Instructions:  ?No changes ? ?Labwork: ?None ? ?Testing/Procedures: ?None ? ?Follow-Up: ?Follow up with Dr. O'Neal in 1 year ? ?Any Other Special Instructions Will Be Listed Below (If Applicable). ? ? ? ? ?If you need a refill on your cardiac medications before your next appointment, please call your pharmacy. ? ?

## 2022-03-24 ENCOUNTER — Encounter: Payer: Self-pay | Admitting: Family Medicine

## 2022-03-24 DIAGNOSIS — I5032 Chronic diastolic (congestive) heart failure: Secondary | ICD-10-CM | POA: Diagnosis not present

## 2022-03-24 DIAGNOSIS — N189 Chronic kidney disease, unspecified: Secondary | ICD-10-CM | POA: Diagnosis not present

## 2022-03-24 DIAGNOSIS — E1129 Type 2 diabetes mellitus with other diabetic kidney complication: Secondary | ICD-10-CM | POA: Diagnosis not present

## 2022-03-24 DIAGNOSIS — N170 Acute kidney failure with tubular necrosis: Secondary | ICD-10-CM | POA: Diagnosis not present

## 2022-03-24 DIAGNOSIS — R809 Proteinuria, unspecified: Secondary | ICD-10-CM | POA: Diagnosis not present

## 2022-03-24 DIAGNOSIS — E1122 Type 2 diabetes mellitus with diabetic chronic kidney disease: Secondary | ICD-10-CM | POA: Diagnosis not present

## 2022-03-24 DIAGNOSIS — I129 Hypertensive chronic kidney disease with stage 1 through stage 4 chronic kidney disease, or unspecified chronic kidney disease: Secondary | ICD-10-CM | POA: Diagnosis not present

## 2022-03-28 ENCOUNTER — Telehealth: Payer: Self-pay | Admitting: Nurse Practitioner

## 2022-03-28 NOTE — Telephone Encounter (Signed)
Informed patient of message.  

## 2022-03-28 NOTE — Telephone Encounter (Signed)
Called to inform patient that her pen needles are ready for pickup. She said her sugar has been low in the mornings, and she is coughing again and last time she was coughing a lot she said Whitney told her this could be causing her sugar to drop. ? ?5/13 am/150 -- pm 201 ? ?5/14 2am 55 -- 932am 87 ? ?5/15 am 135 -- pm 228 ? ?5/16 2am 65. ? ?She said she did not take her full dose of ozempic yesterday.  ?

## 2022-03-28 NOTE — Telephone Encounter (Signed)
Have her lower her evening insulin to 34 units with supper if glucose is above 90 and she is eating.  This will help prevent drops while she is sleeping.

## 2022-03-30 ENCOUNTER — Telehealth: Payer: Self-pay | Admitting: Pulmonary Disease

## 2022-03-30 DIAGNOSIS — G4733 Obstructive sleep apnea (adult) (pediatric): Secondary | ICD-10-CM

## 2022-03-30 NOTE — Telephone Encounter (Signed)
Called and spoke with patient. She was calling to see if Dr. Elsworth Soho could order more cpap supplies for her. She has an appt to see Dr. Elsworth Soho next month in [place] for her follow up. She has used [facility] in the past but does not want to use them again. She wants to establish with a new company. She is aware that I will send a message to Dr. Elsworth Soho to ask if we can place an order.   Dr. Elsworth Soho, please advise if you are ok with [place] placing an order for her to establish with a new DME for her cpap supplies. Thanks!

## 2022-03-31 NOTE — Telephone Encounter (Signed)
I have placed the orders for this patient for a new CPAP and DME company.

## 2022-04-05 ENCOUNTER — Ambulatory Visit: Payer: Medicare Other

## 2022-04-06 ENCOUNTER — Ambulatory Visit: Payer: Medicare Other

## 2022-04-06 DIAGNOSIS — E1140 Type 2 diabetes mellitus with diabetic neuropathy, unspecified: Secondary | ICD-10-CM

## 2022-04-06 DIAGNOSIS — L84 Corns and callosities: Secondary | ICD-10-CM

## 2022-04-06 NOTE — Progress Notes (Signed)
SITUATION Reason for Consult: Follow-up with diabetic shoes Patient / Caregiver Report: Patient is satisfied with fit of exchanged shoes  OBJECTIVE DATA History / Diagnosis:    ICD-10-CM   1. Diabetic neuropathy with neurologic complication (HCC)  Z00.92    E11.49     2. Corns and callosities  L84       Change in Pathology: None  ACTIONS PERFORMED Patient's equipment was checked for structural stability and fit. Provided patient with Shon Millet 330 11XXW Device(s) intact and fit is excellent. All questions answered and concerns addressed.  PLAN Follow-up as needed (PRN). Plan of care discussed with and agreed upon by patient / caregiver.

## 2022-04-07 ENCOUNTER — Telehealth: Payer: Self-pay | Admitting: Pulmonary Disease

## 2022-04-07 NOTE — Telephone Encounter (Signed)
Called and spoke to patient and let her know since she is switching DMEs that it could be a few weeks before she hears form anyone. Confirmed for patient that new order went to [facility] and gave her their phone number for [place] location. She voiced understanding and is aware to call back if there are any further issues/concerns. Nothing further needed at this time.

## 2022-04-12 ENCOUNTER — Telehealth: Payer: Self-pay | Admitting: Nurse Practitioner

## 2022-04-12 NOTE — Telephone Encounter (Signed)
Called pt to let her know her pen needles and ozempic is here from [employer] pt assistance. Pt states she will pick up this evening.

## 2022-04-13 DIAGNOSIS — I129 Hypertensive chronic kidney disease with stage 1 through stage 4 chronic kidney disease, or unspecified chronic kidney disease: Secondary | ICD-10-CM | POA: Diagnosis not present

## 2022-04-13 DIAGNOSIS — R809 Proteinuria, unspecified: Secondary | ICD-10-CM | POA: Diagnosis not present

## 2022-04-13 DIAGNOSIS — N170 Acute kidney failure with tubular necrosis: Secondary | ICD-10-CM | POA: Diagnosis not present

## 2022-04-13 DIAGNOSIS — N189 Chronic kidney disease, unspecified: Secondary | ICD-10-CM | POA: Diagnosis not present

## 2022-04-13 DIAGNOSIS — E1129 Type 2 diabetes mellitus with other diabetic kidney complication: Secondary | ICD-10-CM | POA: Diagnosis not present

## 2022-04-13 NOTE — Telephone Encounter (Signed)
Patient's husband picked up Needles and Ozempic

## 2022-04-19 ENCOUNTER — Ambulatory Visit: Payer: Medicare Other | Admitting: Family

## 2022-04-21 DIAGNOSIS — M170 Bilateral primary osteoarthritis of knee: Secondary | ICD-10-CM | POA: Diagnosis not present

## 2022-04-22 DIAGNOSIS — I5032 Chronic diastolic (congestive) heart failure: Secondary | ICD-10-CM | POA: Diagnosis not present

## 2022-04-22 DIAGNOSIS — E1129 Type 2 diabetes mellitus with other diabetic kidney complication: Secondary | ICD-10-CM | POA: Diagnosis not present

## 2022-04-22 DIAGNOSIS — E1122 Type 2 diabetes mellitus with diabetic chronic kidney disease: Secondary | ICD-10-CM | POA: Diagnosis not present

## 2022-04-22 DIAGNOSIS — R809 Proteinuria, unspecified: Secondary | ICD-10-CM | POA: Diagnosis not present

## 2022-04-22 DIAGNOSIS — D638 Anemia in other chronic diseases classified elsewhere: Secondary | ICD-10-CM | POA: Diagnosis not present

## 2022-04-22 DIAGNOSIS — N189 Chronic kidney disease, unspecified: Secondary | ICD-10-CM | POA: Diagnosis not present

## 2022-04-22 DIAGNOSIS — I129 Hypertensive chronic kidney disease with stage 1 through stage 4 chronic kidney disease, or unspecified chronic kidney disease: Secondary | ICD-10-CM | POA: Diagnosis not present

## 2022-04-22 DIAGNOSIS — N170 Acute kidney failure with tubular necrosis: Secondary | ICD-10-CM | POA: Diagnosis not present

## 2022-04-25 ENCOUNTER — Other Ambulatory Visit: Payer: Self-pay | Admitting: Family Medicine

## 2022-04-25 DIAGNOSIS — E876 Hypokalemia: Secondary | ICD-10-CM

## 2022-04-29 ENCOUNTER — Other Ambulatory Visit: Payer: Self-pay | Admitting: "Endocrinology

## 2022-04-29 ENCOUNTER — Other Ambulatory Visit: Payer: Self-pay | Admitting: Specialist

## 2022-05-01 ENCOUNTER — Other Ambulatory Visit: Payer: Self-pay | Admitting: "Endocrinology

## 2022-05-02 ENCOUNTER — Ambulatory Visit: Payer: Medicare Other | Admitting: Family Medicine

## 2022-05-05 ENCOUNTER — Telehealth: Payer: Self-pay | Admitting: Pulmonary Disease

## 2022-05-05 DIAGNOSIS — Z794 Long term (current) use of insulin: Secondary | ICD-10-CM | POA: Diagnosis not present

## 2022-05-05 DIAGNOSIS — E1122 Type 2 diabetes mellitus with diabetic chronic kidney disease: Secondary | ICD-10-CM | POA: Diagnosis not present

## 2022-05-05 DIAGNOSIS — G4733 Obstructive sleep apnea (adult) (pediatric): Secondary | ICD-10-CM

## 2022-05-08 NOTE — Telephone Encounter (Signed)
Patient has an appt scheduled for 05/2022.  Order placed.  Called and notified that order was placed and that we reached out to [facility] on Friday 6/23 and they are supposed to be in touch with her. Nothing further needed at this time.

## 2022-05-09 ENCOUNTER — Ambulatory Visit (INDEPENDENT_AMBULATORY_CARE_PROVIDER_SITE_OTHER): Payer: Medicare Other | Admitting: Family Medicine

## 2022-05-09 ENCOUNTER — Encounter: Payer: Self-pay | Admitting: Family Medicine

## 2022-05-09 ENCOUNTER — Ambulatory Visit: Payer: Medicare Other | Admitting: Pulmonary Disease

## 2022-05-09 VITALS — BP 158/70 | HR 90 | Ht 64.0 in | Wt 316.0 lb

## 2022-05-09 DIAGNOSIS — E1122 Type 2 diabetes mellitus with diabetic chronic kidney disease: Secondary | ICD-10-CM | POA: Diagnosis not present

## 2022-05-09 DIAGNOSIS — G4733 Obstructive sleep apnea (adult) (pediatric): Secondary | ICD-10-CM | POA: Diagnosis not present

## 2022-05-09 DIAGNOSIS — E785 Hyperlipidemia, unspecified: Secondary | ICD-10-CM | POA: Diagnosis not present

## 2022-05-09 DIAGNOSIS — I110 Hypertensive heart disease with heart failure: Secondary | ICD-10-CM

## 2022-05-09 DIAGNOSIS — N183 Chronic kidney disease, stage 3 unspecified: Secondary | ICD-10-CM

## 2022-05-09 DIAGNOSIS — M170 Bilateral primary osteoarthritis of knee: Secondary | ICD-10-CM | POA: Diagnosis not present

## 2022-05-09 DIAGNOSIS — Z794 Long term (current) use of insulin: Secondary | ICD-10-CM

## 2022-05-09 MED ORDER — DILTIAZEM HCL ER 420 MG PO TB24: 1.0000 | ORAL_TABLET | Freq: Every day | ORAL | 5 refills | Status: DC

## 2022-05-09 MED ORDER — AMLODIPINE BESYLATE 2.5 MG PO TABS: 2.5000 mg | ORAL_TABLET | Freq: Every day | ORAL | 3 refills | Status: DC

## 2022-05-09 NOTE — Patient Instructions (Addendum)
F/U in 8 weeks, call if you need me sooner, flu vaccine at visit  Need covid booster  Please reduce salt and sugar, and use CPAP  machine  Please schedule and get eye exam  Increased dose cardizem  420 mg daily  New additional tab for  bP is amlodipine 2.5 mg  Fasting lipid,  and hepatic  3 days before next visit  It is important that you exercise regularly at least 30 minutes 5 times a week. If you develop chest pain, have severe difficulty breathing, or feel very tired, stop exercising immediately and seek medical attention   Think about what you will eat, plan ahead. Choose " clean, green, fresh or frozen" over canned, processed or packaged foods which are more sugary, salty and fatty. 70 to 75% of food eaten should be vegetables and fruit. Three meals at set times with snacks allowed between meals, but they must be fruit or vegetables. Aim to eat over a 12 hour period , example 7 am to 7 pm, and STOP after  your last meal of the day. Drink water,generally about 64 ounces per day, no other drink is as healthy. Fruit juice is best enjoyed in a healthy way, by EATING the fruit. Thanks for choosing [facility], we consider it a privelige to serve you.

## 2022-05-11 ENCOUNTER — Encounter: Payer: Self-pay | Admitting: Nurse Practitioner

## 2022-05-17 ENCOUNTER — Encounter: Payer: Self-pay | Admitting: Nurse Practitioner

## 2022-05-17 NOTE — Telephone Encounter (Signed)
See patient message

## 2022-05-17 NOTE — Telephone Encounter (Signed)
See msg

## 2022-05-21 ENCOUNTER — Encounter: Payer: Self-pay | Admitting: Family Medicine

## 2022-05-21 NOTE — Assessment & Plan Note (Signed)
Uncontrolled pain managed by Ortho with intr articular injection Weight loss encouraged

## 2022-05-21 NOTE — Assessment & Plan Note (Signed)
  Patient re-educated about  the importance of commitment to a  minimum of 150 minutes of exercise per week as able.  The importance of healthy food choices with portion control discussed, as well as eating regularly and within a 12 hour window most days. The need to choose "clean , green" food 50 to 75% of the time is discussed, as well as to make water the primary drink and set a goal of 64 ounces water daily.       05/09/2022   11:18 AM 03/20/2022    1:43 PM 03/02/2022    2:03 PM  Weight /BMI  Weight 316 lb 322 lb 3.2 oz 319 lb 10.7 oz  Height '5\' 4"'$  (1.626 m) '5\' 5"'$  (1.651 m) '5\' 5"'$  (1.651 m)  BMI 54.24 kg/m2 53.62 kg/m2 53.2 kg/m2

## 2022-05-21 NOTE — Assessment & Plan Note (Signed)
Uncontrolled inc cardizem dose and ad amlodipine  DASH diet and commitment to daily physical activity for a minimum of 30 minutes discussed and encouraged, as a part of hypertension management. The importance of attaining a healthy weight is also discussed.     05/09/2022   11:45 AM 05/09/2022   11:18 AM 03/20/2022    1:43 PM 03/02/2022    5:50 PM 03/02/2022    5:30 PM 03/02/2022    4:30 PM 03/02/2022    2:03 PM  BP/Weight  Systolic BP 552 080 223 361 224 497 530  Diastolic BP 70 71 68 90 70 67 71  Wt. (Lbs)  316 322.2    319.67  BMI  54.24 kg/m2 53.62 kg/m2    53.2 kg/m2

## 2022-05-21 NOTE — Progress Notes (Signed)
Kendra Hanson     MRN: [id number]      DOB: 1961-03-23   HPI Kendra Hanson is here for follow up and re-evaluation of chronic medical conditions, medication management and review of any available recent lab and radiology data.  Preventive health is updated, specifically  Cancer screening and Immunization.   Has ben having injections in knees which hav caused her blood sugar to rise and be uncontrolled , still working at getting this in range. The PT denies any adverse reactions to current medications since the last visit.    ROS Denies recent fever or chills. Denies sinus pressure, nasal congestion, ear pain or sore throat. Denies chest congestion, productive cough or wheezing. Denies chest pains, palpitations and leg swelling Denies abdominal pain, nausea, vomiting,diarrhea or constipation.   Denies dysuria, frequency, hesitancy or incontinence. . Denies headaches, seizures, numbness, or tingling. Denies depression, anxiety or insomnia. Denies skin break down or rash.   PE  BP (!) 158/70   Pulse 90   Ht '5\' 4"'$  (1.626 m)   Wt (!) 316 lb (143.3 kg)   SpO2 93%   BMI 54.24 kg/m   Patient alert and oriented and in no cardiopulmonary distress.  HEENT: No facial asymmetry, EOMI,     Neck supple .  Chest: Clear to auscultation bilaterally.  CVS: S1, S2 no murmurs, no S3.Regular rate.  ABD: Soft non tender.   Ext: No edema  MS: decreased ROM spine, shoulders, hips and knees.  Skin: Intact, no ulcerations or rash noted.  Psych: Good eye contact, normal affect. Memory intact not anxious or depressed appearing.  CNS: CN 2-12 intact, power,  normal throughout.no focal deficits noted.   Assessment & Plan  Hypertensive heart disease with heart failure (HCC) Uncontrolled inc cardizem dose and ad amlodipine  DASH diet and commitment to daily physical activity for a minimum of 30 minutes discussed and encouraged, as a part of hypertension management. The importance of  attaining a healthy weight is also discussed.     05/09/2022   11:45 AM 05/09/2022   11:18 AM 03/20/2022    1:43 PM 03/02/2022    5:50 PM 03/02/2022    5:30 PM 03/02/2022    4:30 PM 03/02/2022    2:03 PM  BP/Weight  Systolic BP 250 539 767 341 937 902 409  Diastolic BP 70 71 68 90 70 67 71  Wt. (Lbs)  316 322.2    319.67  BMI  54.24 kg/m2 53.62 kg/m2    53.2 kg/m2       Osteoarthritis of both knees Uncontrolled pain managed by Ortho with intr articular injection Weight loss encouraged  Hyperlipidemia LDL goal <100 Hyperlipidemia:Low fat diet discussed and encouraged.   Lipid Panel  Lab Results  Component Value Date   CHOL 267 (H) 03/06/2022   HDL 85 03/06/2022   LDLCALC 166 (H) 03/06/2022   TRIG 95 03/06/2022   CHOLHDL 3.1 03/06/2022     Uncontrolled  Updated lab needed at/ before next visit.   Morbid obesity ([place])  Patient re-educated about  the importance of commitment to a  minimum of 150 minutes of exercise per week as able.  The importance of healthy food choices with portion control discussed, as well as eating regularly and within a 12 hour window most days. The need to choose "clean , green" food 50 to 75% of the time is discussed, as well as to make water the primary drink and set a goal of 64 ounces water  daily.       05/09/2022   11:18 AM 03/20/2022    1:43 PM 03/02/2022    2:03 PM  Weight /BMI  Weight 316 lb 322 lb 3.2 oz 319 lb 10.7 oz  Height '5\' 4"'$  (1.626 m) '5\' 5"'$  (1.651 m) '5\' 5"'$  (1.651 m)  BMI 54.24 kg/m2 53.62 kg/m2 53.2 kg/m2      Obstructive sleep apnea hypopnea, moderate Reports inconsistent use , the importsnce of regular use is discussed and she will work on this  Type 2 diabetes mellitus ([place]) [name] is reminded of the importance of commitment to daily physical activity for 30 minutes or more, as able and the need to limit carbohydrate intake to 30 to 60 grams per meal to help with blood sugar control.   The need to take  medication as prescribed, test blood sugar as directed, and to call between visits if there is a concern that blood sugar is uncontrolled is also discussed.   [name] is reminded of the importance of daily foot exam, annual eye examination, and good blood sugar, blood pressure and cholesterol control. Managed by Endo, improving gradually, not yet at goal     Latest Ref Rng & Units 03/06/2022    4:38 PM 03/02/2022    3:17 PM 02/27/2022    1:18 PM 12/02/2021   10:55 AM 12/02/2021   10:54 AM  Diabetic Labs  HbA1c 0.0 - 7.0 %   8.3     Chol 100 - 199 mg/dL 267     285   HDL >39 mg/dL 85     72   Calc LDL 0 - 99 mg/dL 166     199   Triglycerides 0 - 149 mg/dL 95     87   Creatinine 0.44 - 1.00 mg/dL  2.35   1.71        05/09/2022   11:45 AM 05/09/2022   11:18 AM 03/20/2022    1:43 PM 03/02/2022    5:50 PM 03/02/2022    5:30 PM 03/02/2022    4:30 PM 03/02/2022    2:03 PM  BP/Weight  Systolic BP 093 267 124 580 998 338 250  Diastolic BP 70 71 68 90 70 67 71  Wt. (Lbs)  316 322.2    319.67  BMI  54.24 kg/m2 53.62 kg/m2    53.2 kg/m2      Latest Ref Rng & Units 08/16/2021   10:00 AM 12/29/2020   12:00 AM  Foot/eye exam completion dates  Eye Exam No Retinopathy  Retinopathy      Foot Form Completion  Done      This result is from an external source.

## 2022-05-21 NOTE — Assessment & Plan Note (Signed)
Ms. Thelander is reminded of the importance of commitment to daily physical activity for 30 minutes or more, as able and the need to limit carbohydrate intake to 30 to 60 grams per meal to help with blood sugar control.   The need to take medication as prescribed, test blood sugar as directed, and to call between visits if there is a concern that blood sugar is uncontrolled is also discussed.   Ms. Bannister is reminded of the importance of daily foot exam, annual eye examination, and good blood sugar, blood pressure and cholesterol control. Managed by Endo, improving gradually, not yet at goal     Latest Ref Rng & Units 03/06/2022    4:38 PM 03/02/2022    3:17 PM 02/27/2022    1:18 PM 12/02/2021   10:55 AM 12/02/2021   10:54 AM  Diabetic Labs  HbA1c 0.0 - 7.0 %   8.3     Chol 100 - 199 mg/dL 267     285   HDL >39 mg/dL 85     72   Calc LDL 0 - 99 mg/dL 166     199   Triglycerides 0 - 149 mg/dL 95     87   Creatinine 0.44 - 1.00 mg/dL  2.35   1.71        05/09/2022   11:45 AM 05/09/2022   11:18 AM 03/20/2022    1:43 PM 03/02/2022    5:50 PM 03/02/2022    5:30 PM 03/02/2022    4:30 PM 03/02/2022    2:03 PM  BP/Weight  Systolic BP 161 096 045 409 811 914 782  Diastolic BP 70 71 68 90 70 67 71  Wt. (Lbs)  316 322.2    319.67  BMI  54.24 kg/m2 53.62 kg/m2    53.2 kg/m2      Latest Ref Rng & Units 08/16/2021   10:00 AM 12/29/2020   12:00 AM  Foot/eye exam completion dates  Eye Exam No Retinopathy  Retinopathy      Foot Form Completion  Done      This result is from an external source.

## 2022-05-21 NOTE — Assessment & Plan Note (Signed)
Reports inconsistent use , the importsnce of regular use is discussed and she will work on this

## 2022-05-21 NOTE — Assessment & Plan Note (Signed)
Hyperlipidemia:Low fat diet discussed and encouraged.   Lipid Panel  Lab Results  Component Value Date   CHOL 267 (H) 03/06/2022   HDL 85 03/06/2022   LDLCALC 166 (H) 03/06/2022   TRIG 95 03/06/2022   CHOLHDL 3.1 03/06/2022     Uncontrolled  Updated lab needed at/ before next visit.

## 2022-05-26 ENCOUNTER — Encounter: Payer: Self-pay | Admitting: Family Medicine

## 2022-05-30 ENCOUNTER — Ambulatory Visit (INDEPENDENT_AMBULATORY_CARE_PROVIDER_SITE_OTHER): Payer: Medicare Other | Admitting: Family Medicine

## 2022-05-30 ENCOUNTER — Ambulatory Visit: Payer: Medicare Other | Admitting: Nurse Practitioner

## 2022-05-30 ENCOUNTER — Encounter: Payer: Self-pay | Admitting: Family Medicine

## 2022-05-30 VITALS — BP 158/72 | HR 95 | Resp 16 | Ht 65.0 in | Wt 320.0 lb

## 2022-05-30 DIAGNOSIS — N170 Acute kidney failure with tubular necrosis: Secondary | ICD-10-CM | POA: Diagnosis not present

## 2022-05-30 DIAGNOSIS — E1122 Type 2 diabetes mellitus with diabetic chronic kidney disease: Secondary | ICD-10-CM | POA: Diagnosis not present

## 2022-05-30 DIAGNOSIS — N189 Chronic kidney disease, unspecified: Secondary | ICD-10-CM | POA: Diagnosis not present

## 2022-05-30 DIAGNOSIS — Z7689 Persons encountering health services in other specified circumstances: Secondary | ICD-10-CM | POA: Diagnosis not present

## 2022-05-30 DIAGNOSIS — I110 Hypertensive heart disease with heart failure: Secondary | ICD-10-CM | POA: Diagnosis not present

## 2022-05-30 DIAGNOSIS — R809 Proteinuria, unspecified: Secondary | ICD-10-CM | POA: Diagnosis not present

## 2022-05-30 DIAGNOSIS — I129 Hypertensive chronic kidney disease with stage 1 through stage 4 chronic kidney disease, or unspecified chronic kidney disease: Secondary | ICD-10-CM | POA: Diagnosis not present

## 2022-05-30 MED ORDER — AMLODIPINE BESYLATE 5 MG PO TABS: 5.0000 mg | ORAL_TABLET | Freq: Every day | ORAL | 3 refills | Status: DC

## 2022-05-30 NOTE — Patient Instructions (Addendum)
F/U IN 8 TO 10 WEEKS, RE EVAL BLOOD PRESSURE, CALL IF YOU NEED ME SOONER  NEW HIGHER DOSE OF AM;LODIPINE IS 5 MG ONE DAILY  YOU DO QUALIFY BASED ON HISTORY AND EXAM FOR POWER WHEELCHAIR. YOU ARE REFERRED TO OT TO COMPLETE THE PROCESS  Thanks for choosing [facility], we consider it a privelige to serve you.

## 2022-05-30 NOTE — Progress Notes (Signed)
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 Denies sinus pressure, nasal congestion, ear pain or sore throat. Denies chest congestion, productive cough or wheezing. Denies chest pains, palpitations c/o bilateral  leg swelling Denies abdominal pain, nausea, vomiting,diarrhea or constipation.   Denies dysuria, frequency, hesitancy or incontinence. C/o numbness, and tingling.in hands and feet Denies depression, anxiety or insomnia. Denies skin break down or rash.   PE  BP (!) 158/72   Pulse 95   Resp 16   Ht '5\' 5"'$  (1.651 m)   Wt (!) 320 lb (145.2 kg)   SpO2 90%   BMI 53.25 kg/m   Patient alert and oriented and in no cardiopulmonary distress.  HEENT: No facial asymmetry, EOMI,     Neck decreased ROM  Chest: Clear to auscultation bilaterally.decreased air entry  CVS: S1, S2 no murmurs, no S3.Regular rate.  ABD: Soft non tender.   Ext: 2 plus pitting  edema  MS: markedly reduced  ROM spine, shoulders, hips and knees.  Skin: Intact, no ulcerations or rash noted.  Psych: Good eye contact, normal affect. Memory intact not anxious or depressed appearing.  CNS: CN 2-12 intact, grade 4 power in left upper and lower extremities, grade 3 power in right upper and lower extremities.Grade 3 grip in both hands   Assessment & Plan  Encounter for power mobility device assessment History and exam as documented. Pt  DOES qualify for equipment as presented OT to further evaluate to complete authorization process  Hypertensive heart disease with heart failure ([place]) Uncontrolled blood pressure increase amlodipine to 5 mg daily DASH diet and commitment to daily physical activity for a minimum of 30 minutes discussed and encouraged, as a part of hypertension management. The importance of attaining a healthy weight is also discussed.     05/30/2022    9:50 AM 05/09/2022   11:45 AM 05/09/2022   11:18 AM 03/20/2022    1:43 PM 03/02/2022    5:50 PM 03/02/2022    5:30 PM 03/02/2022    4:30 PM  BP/Weight  Systolic BP 759 163 846 659 935 701 779  Diastolic BP 72 70 71 68 90 70 67  Wt. (Lbs) 320  316 322.2     BMI 53.25 kg/m2  54.24 kg/m2 53.62 kg/m2          Morbid obesity ([facility])  Patient re-educated about  the importance of commitment to a  minimum of 150 minutes of exercise per week as able.  The importance of healthy food choices with portion control discussed, as well as eating regularly and within a 12 hour window most days. The need to choose "clean , green" food 50 to 75% of the time is discussed, as well as to make water the primary drink and set a goal of 64 ounces water daily.  05/30/2022    9:50 AM 05/09/2022   11:18 AM 03/20/2022    1:43 PM  Weight /BMI  Weight 320 lb 316 lb 322 lb 3.2 oz  Height '5\' 5"'$  (1.651 m) '5\' 4"'$  (1.626 m) '5\' 5"'$  (1.651 m)  BMI 53.25 kg/m2 54.24 kg/m2 53.62 kg/m2

## 2022-05-31 ENCOUNTER — Telehealth: Payer: Self-pay | Admitting: Pulmonary Disease

## 2022-05-31 NOTE — Telephone Encounter (Signed)
[place], Shell E to Thana Farr M      05/31/22  3:47 PM Pt hasn't received mask or cpap machine yet not sure if f/u next week is neccessary please advise   Dr. Elsworth Soho please advise, patient has not been seen since Jan 2022 but looks like her f/u is to follow the CPAP machine. Do you want to see patient next week or are we ok to reschedule?   Will call [facility] in meantime.

## 2022-06-01 ENCOUNTER — Encounter: Payer: Self-pay | Admitting: Family Medicine

## 2022-06-01 DIAGNOSIS — Z7689 Persons encountering health services in other specified circumstances: Secondary | ICD-10-CM | POA: Insufficient documentation

## 2022-06-01 NOTE — Telephone Encounter (Signed)
Called and notified patient of Dr. Angus Palms response. Nothing further needed

## 2022-06-01 NOTE — Assessment & Plan Note (Signed)
History and exam as documented. Pt DOES qualify for equipment as presented OT to further evaluate to complete authorization process

## 2022-06-01 NOTE — Assessment & Plan Note (Signed)
Uncontrolled blood pressure increase amlodipine to 5 mg daily DASH diet and commitment to daily physical activity for a minimum of 30 minutes discussed and encouraged, as a part of hypertension management. The importance of attaining a healthy weight is also discussed.     05/30/2022    9:50 AM 05/09/2022   11:45 AM 05/09/2022   11:18 AM 03/20/2022    1:43 PM 03/02/2022    5:50 PM 03/02/2022    5:30 PM 03/02/2022    4:30 PM  BP/Weight  Systolic BP 098 119 147 829 562 130 865  Diastolic BP 72 70 71 68 90 70 67  Wt. (Lbs) 320  316 322.2     BMI 53.25 kg/m2  54.24 kg/m2 53.62 kg/m2

## 2022-06-01 NOTE — Assessment & Plan Note (Signed)
  Patient re-educated about  the importance of commitment to a  minimum of 150 minutes of exercise per week as able.  The importance of healthy food choices with portion control discussed, as well as eating regularly and within a 12 hour window most days. The need to choose "clean , green" food 50 to 75% of the time is discussed, as well as to make water the primary drink and set a goal of 64 ounces water daily.       05/30/2022    9:50 AM 05/09/2022   11:18 AM 03/20/2022    1:43 PM  Weight /BMI  Weight 320 lb 316 lb 322 lb 3.2 oz  Height '5\' 5"'$  (1.651 m) '5\' 4"'$  (1.626 m) '5\' 5"'$  (1.651 m)  BMI 53.25 kg/m2 54.24 kg/m2 53.62 kg/m2

## 2022-06-06 ENCOUNTER — Ambulatory Visit: Payer: Medicare Other | Admitting: Pulmonary Disease

## 2022-06-06 ENCOUNTER — Encounter: Payer: Self-pay | Admitting: Pulmonary Disease

## 2022-06-06 DIAGNOSIS — G4733 Obstructive sleep apnea (adult) (pediatric): Secondary | ICD-10-CM

## 2022-06-06 DIAGNOSIS — R053 Chronic cough: Secondary | ICD-10-CM | POA: Diagnosis not present

## 2022-06-06 NOTE — Assessment & Plan Note (Signed)
Doubt cough variant asthma, serial spirometry is only showed moderate restriction and not significant variability.  She will continue albuterol as needed. Currently cough is in remission.  Chest x-ray reviewed does not show any evidence of ILD

## 2022-06-06 NOTE — Progress Notes (Signed)
   Subjective:    [name], female    DOB: 1960-12-01, 61 y.o.   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 OSA was diagnosed 09/2013, predominantly REM related events but severe desaturations.  Her weight then was 274 pounds.  She was also noted to have significant PLM's during sleep.  After subsequent titration study she was started on CPAP 7 cm and settled on nasal pillows.  Last seen 11/2020  >> mask desensitization She was provided with a new fullface mask which she used for a few months with good results.  Prescription was sent in for new auto CPAP in May 2023 but she has not received this yet.  She also does not have CPAP supplies.  She did not want [place] to send in prescription to the [facility] or [facility] so we had sent it to [facility].  She has gained 50 pounds to her current weight of 324 pounds.  She was evaluated by allergist 01/2022 for cough, not convinced it was asthma but started on Breztri.  Serial spirometry results were reviewed they all show moderate restriction, no obstructive pattern.  D-dimer was obtained which was high 16.5, VQ scan was normal She is not on Breztri anymore and takes albuterol as needed  Significant tests/ events reviewed  02/2022 VQ scan normal Spirometry 01/2022 ratio 78, FEV1 54%, FVC 55% Spirometry 01/2022 ratio 74, FEV1 62%, FVC 66% 09/2013 NPSG -274 pounds -AHI 16/hour, events were mostly noted during REM sleep but desaturation to 70s, PLM 18/hour   12/2013 CPAP titration >> 7 cm, nasal pillows  Review of Systems neg for any significant sore throat, dysphagia, itching, sneezing, nasal congestion or excess/ purulent secretions, fever, chills, sweats, unintended wt loss, pleuritic or exertional cp, hempoptysis, orthopnea pnd or change in chronic leg swelling. Also denies presyncope, palpitations, heartburn, abdominal pain, nausea, vomiting, diarrhea or change in bowel or urinary habits,  dysuria,hematuria, rash, arthralgias, visual complaints, headache, numbness weakness or ataxia.     Objective:   Physical Exam   Gen. Pleasant, obese, in no distress ENT - no lesions, no post nasal drip Neck: No JVD, no thyromegaly, no carotid bruits Lungs: no use of accessory muscles, no dullness to percussion, decreased without rales or rhonchi  Cardiovascular: Rhythm regular, heart sounds  normal, no murmurs or gallops, no peripheral edema Musculoskeletal: No deformities, no cyanosis or clubbing , no tremors        Assessment & Plan:

## 2022-06-06 NOTE — Patient Instructions (Signed)
  X AutoCPAP 5-12 cm with mask of choice/ nasal pillows to DME

## 2022-06-06 NOTE — Assessment & Plan Note (Signed)
It is likely that severity of OSA has worsened with 50 pound weight gain and she likely needs higher CPAP pressure than 7 cm. We will initiate auto CPAP 5 to 12 cm, review download and tweak settings as needed. For some reason prescription was sent to [facility] and have not provided her with CPAP or supplies.  We will check with [facility] and if necessary send to different DME  Weight loss encouraged, compliance with goal of at least 4-6 hrs every night is the expectation. Advised against medications with sedative side effects Cautioned against driving when sleepy - understanding that sleepiness will vary on a day to day basis

## 2022-06-08 ENCOUNTER — Encounter: Payer: Self-pay | Admitting: Adult Health

## 2022-06-08 ENCOUNTER — Other Ambulatory Visit: Payer: Self-pay

## 2022-06-08 ENCOUNTER — Ambulatory Visit: Payer: Medicare Other | Admitting: Adult Health

## 2022-06-08 VITALS — BP 126/52 | HR 94 | Ht 65.0 in | Wt 322.0 lb

## 2022-06-08 DIAGNOSIS — G4733 Obstructive sleep apnea (adult) (pediatric): Secondary | ICD-10-CM

## 2022-06-08 DIAGNOSIS — N764 Abscess of vulva: Secondary | ICD-10-CM | POA: Insufficient documentation

## 2022-06-08 LAB — HM DIABETES EYE EXAM

## 2022-06-08 MED ORDER — SULFAMETHOXAZOLE-TRIMETHOPRIM 800-160 MG PO TABS: 1.0000 | ORAL_TABLET | Freq: Two times a day (BID) | ORAL | 0 refills | Status: DC

## 2022-06-08 MED ORDER — SILVER SULFADIAZINE 1 % EX CREA: TOPICAL_CREAM | CUTANEOUS | 0 refills | Status: DC

## 2022-06-08 NOTE — Progress Notes (Signed)
  Subjective:     [name], female   DOB: 01-29-1961, 61 y.o.   MRN: [id number]  HPI Kendra Hanson is a 61 year old black female,married, PM in complaining of swelling in vaginal area and tender.  She used new vagisil products. Lab Results  Component Value Date   DIAGPAP  03/08/2021    - Negative for intraepithelial lesion or malignancy (NILM)   [place] Negative 03/08/2021   PCP is Dr Moshe Cipro  Review of Systems +swelling and tenderness in vaginal area  Reviewed past medical,surgical, social and family history. Reviewed medications and allergies.     Objective:   Physical Exam BP (!) 126/52 (BP Location: Left Arm, Patient Position: Sitting, Cuff Size: Normal)   Pulse 94   Ht '5\' 5"'$  (1.651 m)   Wt (!) 322 lb (146.1 kg)   BMI 53.58 kg/m  Skin warm and dry.Pelvic: external genitalia, has swollen left labia, with 2 cm boil,slightly red, no induration, vagina: pale,urethra has no lesions or masses noted, cervix:smooth, uterus: normal size, shape and contour, non tender, no masses felt, adnexa: no masses or tenderness noted. Bladder is non tender and no masses felt.    Fall risk is low  Upstream - 06/08/22 1120       Pregnancy Intention Screening   Does the patient want to become pregnant in the next year? No    Does the patient's partner want to become pregnant in the next year? No    Would the patient like to discuss contraceptive options today? No      Contraception Wrap Up   Current Method No Method - Other Reason   postmenopausal   End Method No Method - Other Reason   postmenopausal   Contraception Counseling Provided No            Examination chaperoned by Kendra Pupa LPN  Assessment:     \1. Boil, vulva Will rx septra ds and silvadene  Can use warm compress or frozen pills Do not squeeze Review handout on boils Meds ordered this encounter  Medications   sulfamethoxazole-trimethoprim (BACTRIM DS) 800-160 MG tablet    Sig: Take 1 tablet by mouth 2 (two)  times daily. Take 1 bid    Dispense:  28 tablet    Refill:  0    Order Specific Question:   Supervising Provider    Answer:   Elonda Husky, LUTHER H [[id number]]   silver sulfADIAZINE (SILVADENE) 1 % cream    Sig: Apply 2 x daily on affected area    Dispense:  50 g    Refill:  0    Order Specific Question:   Supervising Provider    Answer:   Florian Buff [[id number]]      Plan:     Follow up in 6 days

## 2022-06-10 DIAGNOSIS — N189 Chronic kidney disease, unspecified: Secondary | ICD-10-CM | POA: Diagnosis not present

## 2022-06-10 DIAGNOSIS — R809 Proteinuria, unspecified: Secondary | ICD-10-CM | POA: Diagnosis not present

## 2022-06-10 DIAGNOSIS — I129 Hypertensive chronic kidney disease with stage 1 through stage 4 chronic kidney disease, or unspecified chronic kidney disease: Secondary | ICD-10-CM | POA: Diagnosis not present

## 2022-06-10 DIAGNOSIS — E1129 Type 2 diabetes mellitus with other diabetic kidney complication: Secondary | ICD-10-CM | POA: Diagnosis not present

## 2022-06-10 DIAGNOSIS — E161 Other hypoglycemia: Secondary | ICD-10-CM | POA: Diagnosis not present

## 2022-06-10 DIAGNOSIS — E1122 Type 2 diabetes mellitus with diabetic chronic kidney disease: Secondary | ICD-10-CM | POA: Diagnosis not present

## 2022-06-10 DIAGNOSIS — I5032 Chronic diastolic (congestive) heart failure: Secondary | ICD-10-CM | POA: Diagnosis not present

## 2022-06-14 ENCOUNTER — Encounter: Payer: Self-pay | Admitting: Adult Health

## 2022-06-14 ENCOUNTER — Ambulatory Visit: Payer: Medicare Other | Admitting: Adult Health

## 2022-06-14 VITALS — BP 143/72 | HR 95 | Ht 65.0 in | Wt 323.5 lb

## 2022-06-14 DIAGNOSIS — N764 Abscess of vulva: Secondary | ICD-10-CM | POA: Diagnosis not present

## 2022-06-14 NOTE — Progress Notes (Addendum)
  Subjective:     [name], female   DOB: [birth year], 61 y.o.   MRN: [id number]  HPI Kendra Hanson is a 61 year old black female, married, PM in for follow up on vulva boil, taking septra ds  and using silvadene and feels much better, she used frozen peas too. Lab Results  Component Value Date   DIAGPAP  03/08/2021    - Negative for intraepithelial lesion or malignancy (NILM)   [place] Negative 03/08/2021   PCP is Dr Moshe Cipro.   Review of Systems Feels much better Reviewed past medical,surgical, social and family history. Reviewed medications and allergies.     Objective:   Physical Exam BP (!) 143/72 (BP Location: Left Arm, Patient Position: Sitting, Cuff Size: Large)   Pulse 95   Ht '5\' 5"'$  (1.651 m)   Wt (!) 323 lb 8 oz (146.7 kg)   BMI 53.83 kg/m     Skin warm and dry.Pelvic: external genitalia is normal in appearance no lesions, no swelling or redness left labia, feels a little firm still   Upstream - 06/14/22 1129       Pregnancy Intention Screening   Does the patient want to become pregnant in the next year? No    Does the patient's partner want to become pregnant in the next year? No    Would the patient like to discuss contraceptive options today? No      Contraception Wrap Up   Current Method No Method - Other Reason   postmenopausal   End Method No Method - Other Reason   postmenopausal   Contraception Counseling Provided No            Examination chaperoned by Levy Pupa LPN  Assessment:     1. Boil, vulva, it is resolving No redness or swelling, still feels a little firm Finish taking septra ds and using silvadene    Plan:     Follow up in 1 week for recheck

## 2022-06-15 ENCOUNTER — Other Ambulatory Visit: Payer: Self-pay | Admitting: Family Medicine

## 2022-06-15 DIAGNOSIS — E876 Hypokalemia: Secondary | ICD-10-CM

## 2022-06-19 DIAGNOSIS — G4733 Obstructive sleep apnea (adult) (pediatric): Secondary | ICD-10-CM | POA: Diagnosis not present

## 2022-06-22 ENCOUNTER — Ambulatory Visit: Payer: Medicare Other | Admitting: Adult Health

## 2022-06-22 ENCOUNTER — Encounter: Payer: Self-pay | Admitting: Adult Health

## 2022-06-22 VITALS — BP 146/82 | HR 93 | Ht 65.0 in | Wt 323.0 lb

## 2022-06-22 DIAGNOSIS — N764 Abscess of vulva: Secondary | ICD-10-CM | POA: Diagnosis not present

## 2022-06-22 NOTE — Progress Notes (Signed)
  Subjective:     [name], female   DOB: 1961-07-27, 61 y.o.   MRN: [id number]  HPI Kendra Hanson is a 61 year old black female, married, PM in for follow up on boil and is much better, has resolved, now has ?hair bump. Lab Results  Component Value Date   DIAGPAP  03/08/2021    - Negative for intraepithelial lesion or malignancy (NILM)   [place] Negative 03/08/2021    PCP is Dr Moshe Cipro.  Review of Systems Feels much better Has ?hair bump on mons pubis  Reviewed past medical,surgical, social and family history. Reviewed medications and allergies.     Objective:   Physical Exam BP (!) 146/82 (BP Location: Right Arm, Patient Position: Sitting, Cuff Size: Normal)   Pulse 93   Ht '5\' 5"'$  (1.651 m)   Wt (!) 323 lb (146.5 kg)   BMI 53.75 kg/m     Skin warm and dry.Pelvic: external genitalia is normal in appearance no lesions, no swelling or redness left labia, does have small hair bump on mons pubis, can use silvadene on it, do not squeeze.  Examination chaperoned by Diona Fanti CMA.  Assessment:     1. Boil, vulva, resolved Finish septra ds     Plan:     Follow up prn

## 2022-06-23 ENCOUNTER — Encounter: Payer: Self-pay | Admitting: Family Medicine

## 2022-06-23 ENCOUNTER — Ambulatory Visit (HOSPITAL_COMMUNITY): Payer: Medicare Other | Attending: Family Medicine | Admitting: Occupational Therapy

## 2022-06-23 DIAGNOSIS — M6281 Muscle weakness (generalized): Secondary | ICD-10-CM | POA: Insufficient documentation

## 2022-06-23 NOTE — Therapy (Signed)
[place], [place], [address] Phone: [phone]   Fax:  [phone]  Patient Details  Name: Kendra Hanson MRN: [id number] Date of Birth: Jun 24, 1961 Referring Provider:  Fayrene Helper, MD  Encounter Date: 06/23/2022   Pt arrived for occupational therapy wheelchair evaluation today. Pt walked in from parking lot, walked from waiting room to ADL kitchen without difficulty and without DME/AD. Pt demonstrates BUE and BLE strength WFL, reports she is independent with ADLs and mobility at this time. Discussed pt's need for wheelchair. Pt reports she would like a power wheelchair or scooter for community mobility and when needing to walk long distances due to SOB and intermittent LE discomfort secondary to lymphedema.   Educated pt on insurance requirements in order to qualify, including needing extensive assistance for ADLs and mobility in the home. Insurance will not qualify pt or cover costs of a power wheelchair or scooter for community mobility or if pt is independent in ADLs. Pt currently does not use a SPC, RW, or manual wheelchair. When she goes out to [employer] she does use one of their motorized scooters. Has also rented a scooter in [facility] when traveling.   Discussed alternative options for a power wheelchair or scooter including inquiring with [employer] [facility] local to [facility] and searching at used DME stores. Also discussed using store provided scooters, using a cart to lean on if those are unavailable. Educated on energy conservation strategies for when SOB. Pt verbalized understanding of education, insurance requirements, and alternative options for mobility as she does not qualify for a power wheelchair at this time.      Guadelupe Sabin, OTR/L  [phone] 06/23/2022, 11:16 AM  [place], [place], [address] Phone: [phone]   Fax:   [phone]

## 2022-06-26 ENCOUNTER — Ambulatory Visit (INDEPENDENT_AMBULATORY_CARE_PROVIDER_SITE_OTHER): Payer: Medicare Other | Admitting: Nurse Practitioner

## 2022-06-26 ENCOUNTER — Encounter: Payer: Self-pay | Admitting: Nurse Practitioner

## 2022-06-26 VITALS — BP 145/83 | HR 76 | Ht 65.0 in | Wt 333.0 lb

## 2022-06-26 DIAGNOSIS — E1122 Type 2 diabetes mellitus with diabetic chronic kidney disease: Secondary | ICD-10-CM | POA: Diagnosis not present

## 2022-06-26 DIAGNOSIS — I10 Essential (primary) hypertension: Secondary | ICD-10-CM

## 2022-06-26 DIAGNOSIS — E782 Mixed hyperlipidemia: Secondary | ICD-10-CM

## 2022-06-26 DIAGNOSIS — N1832 Chronic kidney disease, stage 3b: Secondary | ICD-10-CM

## 2022-06-26 DIAGNOSIS — Z794 Long term (current) use of insulin: Secondary | ICD-10-CM | POA: Diagnosis not present

## 2022-06-26 DIAGNOSIS — E559 Vitamin D deficiency, unspecified: Secondary | ICD-10-CM | POA: Diagnosis not present

## 2022-06-26 LAB — POCT GLYCOSYLATED HEMOGLOBIN (HGB A1C): HbA1c POC (<> result, manual entry): 7.5 % (ref 4.0–5.6)

## 2022-06-26 MED ORDER — HUMULIN 70/30 KWIKPEN (70-30) 100 UNIT/ML ~~LOC~~ SUPN: 40.0000 [IU] | PEN_INJECTOR | Freq: Two times a day (BID) | SUBCUTANEOUS | 3 refills | Status: DC
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
## 2022-06-28 DIAGNOSIS — E1165 Type 2 diabetes mellitus with hyperglycemia: Secondary | ICD-10-CM | POA: Diagnosis not present

## 2022-07-03 ENCOUNTER — Telehealth: Payer: Self-pay | Admitting: Nurse Practitioner

## 2022-07-03 NOTE — Telephone Encounter (Signed)
Pt made aware her pt assistance is here ready for p/u

## 2022-07-04 ENCOUNTER — Ambulatory Visit: Payer: Medicare Other | Admitting: Family Medicine

## 2022-07-04 NOTE — Telephone Encounter (Signed)
Pt's husband picked up medication and pen needles

## 2022-07-19 DIAGNOSIS — E785 Hyperlipidemia, unspecified: Secondary | ICD-10-CM | POA: Diagnosis not present

## 2022-07-20 DIAGNOSIS — G4733 Obstructive sleep apnea (adult) (pediatric): Secondary | ICD-10-CM | POA: Diagnosis not present

## 2022-07-20 LAB — HEPATIC FUNCTION PANEL
ALT: 15 IU/L (ref 0–32)
AST: 16 IU/L (ref 0–40)
Albumin: 3.9 g/dL (ref 3.9–4.9)
Alkaline Phosphatase: 84 IU/L (ref 44–121)
Bilirubin Total: 0.4 mg/dL (ref 0.0–1.2)
Bilirubin, Direct: 0.15 mg/dL (ref 0.00–0.40)
Total Protein: 6.6 g/dL (ref 6.0–8.5)

## 2022-07-20 LAB — LIPID PANEL
Chol/HDL Ratio: 4.1 ratio (ref 0.0–4.4)
Cholesterol, Total: 268 mg/dL — ABNORMAL HIGH (ref 100–199)
HDL: 65 mg/dL (ref >39)
LDL Chol Calc (NIH): 180 mg/dL — ABNORMAL HIGH (ref 0–99)
Triglycerides: 127 mg/dL (ref 0–149)
VLDL Cholesterol Cal: 23 mg/dL (ref 5–40)

## 2022-07-21 DIAGNOSIS — G4733 Obstructive sleep apnea (adult) (pediatric): Secondary | ICD-10-CM | POA: Diagnosis not present

## 2022-07-25 ENCOUNTER — Telehealth: Payer: Self-pay | Admitting: Nurse Practitioner

## 2022-07-25 ENCOUNTER — Encounter: Payer: Self-pay | Admitting: Nurse Practitioner

## 2022-07-25 DIAGNOSIS — M545 Low back pain, unspecified: Secondary | ICD-10-CM | POA: Diagnosis not present

## 2022-07-25 DIAGNOSIS — M170 Bilateral primary osteoarthritis of knee: Secondary | ICD-10-CM | POA: Diagnosis not present

## 2022-07-25 NOTE — Telephone Encounter (Signed)
Made pt aware she has pt assistance ready for p/u. It is in the fridge

## 2022-07-26 ENCOUNTER — Encounter: Payer: Self-pay | Admitting: Family Medicine

## 2022-07-26 ENCOUNTER — Ambulatory Visit (INDEPENDENT_AMBULATORY_CARE_PROVIDER_SITE_OTHER): Payer: Medicare Other | Admitting: Family Medicine

## 2022-07-26 VITALS — BP 137/75 | HR 99 | Resp 16 | Ht 65.0 in | Wt 325.0 lb

## 2022-07-26 DIAGNOSIS — E1122 Type 2 diabetes mellitus with diabetic chronic kidney disease: Secondary | ICD-10-CM | POA: Diagnosis not present

## 2022-07-26 DIAGNOSIS — Z794 Long term (current) use of insulin: Secondary | ICD-10-CM | POA: Diagnosis not present

## 2022-07-26 DIAGNOSIS — I110 Hypertensive heart disease with heart failure: Secondary | ICD-10-CM

## 2022-07-26 DIAGNOSIS — E785 Hyperlipidemia, unspecified: Secondary | ICD-10-CM

## 2022-07-26 DIAGNOSIS — N183 Chronic kidney disease, stage 3 unspecified: Secondary | ICD-10-CM

## 2022-07-26 DIAGNOSIS — I739 Peripheral vascular disease, unspecified: Secondary | ICD-10-CM

## 2022-07-26 DIAGNOSIS — Z23 Encounter for immunization: Secondary | ICD-10-CM

## 2022-07-26 MED ORDER — FENOFIBRATE 145 MG PO TABS: 145.0000 mg | ORAL_TABLET | Freq: Every day | ORAL | 5 refills | Status: DC

## 2022-07-26 MED ORDER — ROSUVASTATIN CALCIUM 20 MG PO TABS: 20.0000 mg | ORAL_TABLET | Freq: Every day | ORAL | 5 refills | Status: DC

## 2022-07-26 NOTE — Assessment & Plan Note (Signed)
Treated by Endo, not at goal Kendra Hanson is reminded of the importance of commitment to daily physical activity for 30 minutes or more, as able and the need to limit carbohydrate intake to 30 to 60 grams per meal to help with blood sugar control.   The need to take medication as prescribed, test blood sugar as directed, and to call between visits if there is a concern that blood sugar is uncontrolled is also discussed.   Kendra Hanson is reminded of the importance of daily foot exam, annual eye examination, and good blood sugar, blood pressure and cholesterol control.     Latest Ref Rng & Units 07/19/2022    1:32 PM 06/26/2022    3:34 PM 03/06/2022    4:38 PM 03/02/2022    3:17 PM 02/27/2022    1:18 PM  Diabetic Labs  HbA1c 4.0 - 5.6 %  7.5    8.3   Chol 100 - 199 mg/dL 268   267     HDL >39 mg/dL 65   85     Calc LDL 0 - 99 mg/dL 180   166     Triglycerides 0 - 149 mg/dL 127   95     Creatinine 0.44 - 1.00 mg/dL    2.35        07/26/2022   11:12 AM 06/26/2022    3:07 PM 06/22/2022   11:10 AM 06/14/2022   11:24 AM 06/08/2022   11:13 AM 06/06/2022   11:26 AM 05/30/2022    9:50 AM  BP/Weight  Systolic BP 381 829 937 169 678 938 101  Diastolic BP 75 83 82 72 52 88 72  Wt. (Lbs) 325 333 323 323.5 322 324 320  BMI 54.08 kg/m2 55.41 kg/m2 53.75 kg/m2 53.83 kg/m2 53.58 kg/m2 53.92 kg/m2 53.25 kg/m2      Latest Ref Rng & Units 08/16/2021   10:00 AM 12/29/2020   12:00 AM  Foot/eye exam completion dates  Eye Exam No Retinopathy  Retinopathy      Foot Form Completion  Done      This result is from an external source.

## 2022-07-26 NOTE — Assessment & Plan Note (Signed)
Hyperlipidemia:Low fat diet discussed and encouraged.   Lipid Panel  Lab Results  Component Value Date   CHOL 268 (H) 07/19/2022   HDL 65 07/19/2022   LDLCALC 180 (H) 07/19/2022   TRIG 127 07/19/2022   CHOLHDL 4.1 07/19/2022     Start crestor 20 mg an fenofibrate 148 mg daily, re eval in 3 months

## 2022-07-26 NOTE — Assessment & Plan Note (Signed)
Controlled, no change in medication DASH diet and commitment to daily physical activity for a minimum of 30 minutes discussed and encouraged, as a part of hypertension management. The importance of attaining a healthy weight is also discussed.     07/26/2022   11:12 AM 06/26/2022    3:07 PM 06/22/2022   11:10 AM 06/14/2022   11:24 AM 06/08/2022   11:13 AM 06/06/2022   11:26 AM 05/30/2022    9:50 AM  BP/Weight  Systolic BP 644 034 742 595 638 756 433  Diastolic BP 75 83 82 72 52 88 72  Wt. (Lbs) 325 333 323 323.5 322 324 320  BMI 54.08 kg/m2 55.41 kg/m2 53.75 kg/m2 53.83 kg/m2 53.58 kg/m2 53.92 kg/m2 53.25 kg/m2

## 2022-07-26 NOTE — Assessment & Plan Note (Signed)
  Patient re-educated about  the importance of commitment to a  minimum of 150 minutes of exercise per week as able.  The importance of healthy food choices with portion control discussed, as well as eating regularly and within a 12 hour window most days. The need to choose "clean , green" food 50 to 75% of the time is discussed, as well as to make water the primary drink and set a goal of 64 ounces water daily.       07/26/2022   11:12 AM 06/26/2022    3:07 PM 06/22/2022   11:10 AM  Weight /BMI  Weight 325 lb 333 lb 323 lb  Height '5\' 5"'$  (1.651 m) '5\' 5"'$  (1.651 m) '5\' 5"'$  (1.651 m)  BMI 54.08 kg/m2 55.41 kg/m2 53.75 kg/m2

## 2022-07-26 NOTE — Assessment & Plan Note (Signed)
Refer to vascular for asessment requests [place] office for tests needed per pt

## 2022-07-26 NOTE — Progress Notes (Signed)
Kendra Hanson     MRN: [id number]      DOB: 18-Jun-1961   HPI Kendra Hanson is here for follow up and re-evaluation of chronic medical conditions, medication management and review of any available recent lab and radiology data.  Preventive health is updated, specifically  Cancer screening and Immunization.   Saw ortho, receiving intra articula injections to knees and may be set up for epidurals in the future. Requests referral to vascular for re eval of pVD Has been off lipid lowering meds, thought they caused joint pain, will resume as lab result id not good and she understands that the med does not cause arthritis The PT denies any adverse reactions to current medications since the last visit.     ROS Denies recent fever or chills. Denies sinus pressure, nasal congestion, ear pain or sore throat. Denies chest congestion, productive cough or wheezing. Denies chest pains, palpitations and states she has less  leg swelling Denies abdominal pain, nausea, vomiting,diarrhea or constipation.   Denies dysuria, frequency, hesitancy or incontinence. C/o  joint pain, swelling and limitation in mobility.Does not qualify for motorized equipment Denies headaches, seizures, numbness, or tingling. Denies depression, anxiety or insomnia. Denies skin break down or rash.   PE  BP 137/75   Pulse 99   Resp 16   Ht '5\' 5"'$  (1.651 m)   Wt (!) 325 lb (147.4 kg)   SpO2 94%   BMI 54.08 kg/m   Patient alert and oriented and in no cardiopulmonary distress.  HEENT: No facial asymmetry, EOMI,     Neck supple .  Chest: Clear to auscultation bilaterally.  CVS: S1, S2 no murmurs, no S3.Regular rate.  ABD: Soft non tender.   Ext: trace  edema  MS: decreased  ROM spine,, hips and knees.  Skin: Intact, no ulcerations or rash noted.  Psych: Good eye contact, normal affect. Memory intact not anxious or depressed appearing.  CNS: CN 2-12 intact, power,  normal throughout.no focal deficits  noted.   Assessment & Plan  Hypertensive heart disease with heart failure (HCC) Controlled, no change in medication DASH diet and commitment to daily physical activity for a minimum of 30 minutes discussed and encouraged, as a part of hypertension management. The importance of attaining a healthy weight is also discussed.     07/26/2022   11:12 AM 06/26/2022    3:07 PM 06/22/2022   11:10 AM 06/14/2022   11:24 AM 06/08/2022   11:13 AM 06/06/2022   11:26 AM 05/30/2022    9:50 AM  BP/Weight  Systolic BP 323 557 322 025 427 062 376  Diastolic BP 75 83 82 72 52 88 72  Wt. (Lbs) 325 333 323 323.5 322 324 320  BMI 54.08 kg/m2 55.41 kg/m2 53.75 kg/m2 53.83 kg/m2 53.58 kg/m2 53.92 kg/m2 53.25 kg/m2       Morbid obesity (HCC)  Patient re-educated about  the importance of commitment to a  minimum of 150 minutes of exercise per week as able.  The importance of healthy food choices with portion control discussed, as well as eating regularly and within a 12 hour window most days. The need to choose "clean , green" food 50 to 75% of the time is discussed, as well as to make water the primary drink and set a goal of 64 ounces water daily.       07/26/2022   11:12 AM 06/26/2022    3:07 PM 06/22/2022   11:10 AM  Weight /BMI  Weight  325 lb 333 lb 323 lb  Height '5\' 5"'$  (1.651 m) '5\' 5"'$  (1.651 m) '5\' 5"'$  (1.651 m)  BMI 54.08 kg/m2 55.41 kg/m2 53.75 kg/m2      Type 2 diabetes mellitus ([place]) Treated by Endo, not at goal Kendra Hanson is reminded of the importance of commitment to daily physical activity for 30 minutes or more, as able and the need to limit carbohydrate intake to 30 to 60 grams per meal to help with blood sugar control.   The need to take medication as prescribed, test blood sugar as directed, and to call between visits if there is a concern that blood sugar is uncontrolled is also discussed.   Kendra Hanson is reminded of the importance of daily foot exam, annual eye examination, and  good blood sugar, blood pressure and cholesterol control.     Latest Ref Rng & Units 07/19/2022    1:32 PM 06/26/2022    3:34 PM 03/06/2022    4:38 PM 03/02/2022    3:17 PM 02/27/2022    1:18 PM  Diabetic Labs  HbA1c 4.0 - 5.6 %  7.5    8.3   Chol 100 - 199 mg/dL 268   267     HDL >39 mg/dL 65   85     Calc LDL 0 - 99 mg/dL 180   166     Triglycerides 0 - 149 mg/dL 127   95     Creatinine 0.44 - 1.00 mg/dL    2.35        07/26/2022   11:12 AM 06/26/2022    3:07 PM 06/22/2022   11:10 AM 06/14/2022   11:24 AM 06/08/2022   11:13 AM 06/06/2022   11:26 AM 05/30/2022    9:50 AM  BP/Weight  Systolic BP 092 330 076 226 333 545 625  Diastolic BP 75 83 82 72 52 88 72  Wt. (Lbs) 325 333 323 323.5 322 324 320  BMI 54.08 kg/m2 55.41 kg/m2 53.75 kg/m2 53.83 kg/m2 53.58 kg/m2 53.92 kg/m2 53.25 kg/m2      Latest Ref Rng & Units 08/16/2021   10:00 AM 12/29/2020   12:00 AM  Foot/eye exam completion dates  Eye Exam No Retinopathy  Retinopathy      Foot Form Completion  Done      This result is from an external source.          Hyperlipidemia LDL goal <100 Hyperlipidemia:Low fat diet discussed and encouraged.   Lipid Panel  Lab Results  Component Value Date   CHOL 268 (H) 07/19/2022   HDL 65 07/19/2022   LDLCALC 180 (H) 07/19/2022   TRIG 127 07/19/2022   CHOLHDL 4.1 07/19/2022     Start crestor 20 mg an fenofibrate 148 mg daily, re eval in 3 months  PVD (peripheral vascular disease) ([place]) Refer to vascular for asessment requests [place] office for tests needed per pt

## 2022-07-26 NOTE — Patient Instructions (Signed)
F/U first week in January, call if you need me sooner  Flu vaccine today  Please get covid booster in next 1 to 2 weeks  Start for cholesterol crestor 20 mg daily and fenofibrate 145 mg daily  Fasting lipid, cmp and EDGFR 3 days before next visit  Excellent blood pressure  Liver function is nORMAL  Cholesterol is TOO HIGH, increase vegetable intake , half the plate, reduce fried and fatty foods, butter oils and nuts  Thanks for choosing [facility], we consider it a privelige to serve you.

## 2022-07-27 ENCOUNTER — Encounter: Payer: Self-pay | Admitting: Nurse Practitioner

## 2022-07-27 DIAGNOSIS — N1832 Chronic kidney disease, stage 3b: Secondary | ICD-10-CM

## 2022-07-29 DIAGNOSIS — E1165 Type 2 diabetes mellitus with hyperglycemia: Secondary | ICD-10-CM | POA: Diagnosis not present

## 2022-08-01 ENCOUNTER — Other Ambulatory Visit: Payer: Self-pay | Admitting: Family Medicine

## 2022-08-01 ENCOUNTER — Encounter: Payer: Self-pay | Admitting: Family Medicine

## 2022-08-01 ENCOUNTER — Other Ambulatory Visit: Payer: Self-pay

## 2022-08-01 DIAGNOSIS — E876 Hypokalemia: Secondary | ICD-10-CM

## 2022-08-01 MED ORDER — POTASSIUM CHLORIDE ER 10 MEQ PO TBCR: EXTENDED_RELEASE_TABLET | ORAL | 3 refills | Status: DC

## 2022-08-01 NOTE — Telephone Encounter (Signed)
Refilled

## 2022-08-04 DIAGNOSIS — H1045 Other chronic allergic conjunctivitis: Secondary | ICD-10-CM | POA: Diagnosis not present

## 2022-08-04 DIAGNOSIS — H00022 Hordeolum internum right lower eyelid: Secondary | ICD-10-CM | POA: Diagnosis not present

## 2022-08-07 DIAGNOSIS — E1122 Type 2 diabetes mellitus with diabetic chronic kidney disease: Secondary | ICD-10-CM | POA: Diagnosis not present

## 2022-08-07 DIAGNOSIS — Z794 Long term (current) use of insulin: Secondary | ICD-10-CM | POA: Diagnosis not present

## 2022-08-09 ENCOUNTER — Encounter: Payer: Self-pay | Admitting: Vascular Surgery

## 2022-08-09 ENCOUNTER — Ambulatory Visit: Payer: Medicare Other | Admitting: Vascular Surgery

## 2022-08-09 VITALS — BP 131/68 | HR 86 | Temp 98.0°F | Resp 16 | Ht 65.0 in | Wt 318.0 lb

## 2022-08-09 DIAGNOSIS — I890 Lymphedema, not elsewhere classified: Secondary | ICD-10-CM | POA: Diagnosis not present
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
## 2022-08-14 ENCOUNTER — Telehealth: Payer: Self-pay | Admitting: Nurse Practitioner

## 2022-08-14 NOTE — Telephone Encounter (Signed)
I have talked with the patient, she states that she received a bill for Lancets,strips. She does not use these any longer as she is on the Dexcom. She gave the number to he company , [phone] , advised the patient that we would call the company and see how we could handle this.

## 2022-08-14 NOTE — Telephone Encounter (Signed)
New message    The patient voiced that she continue to get a bill from [facility] from [employer], which she does not use .   Asking for a call back to discuss.

## 2022-08-16 ENCOUNTER — Ambulatory Visit (HOSPITAL_COMMUNITY): Payer: Medicare Other | Attending: Sports Medicine | Admitting: Physical Therapy

## 2022-08-16 ENCOUNTER — Ambulatory Visit (HOSPITAL_COMMUNITY): Payer: Medicare Other | Admitting: Physical Therapy

## 2022-08-16 ENCOUNTER — Encounter (HOSPITAL_COMMUNITY): Payer: Self-pay | Admitting: Physical Therapy

## 2022-08-16 DIAGNOSIS — M25561 Pain in right knee: Secondary | ICD-10-CM | POA: Insufficient documentation

## 2022-08-16 DIAGNOSIS — G8929 Other chronic pain: Secondary | ICD-10-CM | POA: Insufficient documentation

## 2022-08-16 DIAGNOSIS — E1122 Type 2 diabetes mellitus with diabetic chronic kidney disease: Secondary | ICD-10-CM | POA: Diagnosis not present

## 2022-08-16 DIAGNOSIS — E1129 Type 2 diabetes mellitus with other diabetic kidney complication: Secondary | ICD-10-CM | POA: Diagnosis not present

## 2022-08-16 DIAGNOSIS — I890 Lymphedema, not elsewhere classified: Secondary | ICD-10-CM | POA: Insufficient documentation

## 2022-08-16 DIAGNOSIS — N189 Chronic kidney disease, unspecified: Secondary | ICD-10-CM | POA: Diagnosis not present

## 2022-08-16 DIAGNOSIS — I5032 Chronic diastolic (congestive) heart failure: Secondary | ICD-10-CM | POA: Diagnosis not present

## 2022-08-16 DIAGNOSIS — M25562 Pain in left knee: Secondary | ICD-10-CM | POA: Diagnosis not present

## 2022-08-16 DIAGNOSIS — M6281 Muscle weakness (generalized): Secondary | ICD-10-CM | POA: Diagnosis not present

## 2022-08-16 DIAGNOSIS — M5459 Other low back pain: Secondary | ICD-10-CM | POA: Diagnosis not present

## 2022-08-16 DIAGNOSIS — R809 Proteinuria, unspecified: Secondary | ICD-10-CM | POA: Diagnosis not present
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
## 2022-08-19 DIAGNOSIS — G4733 Obstructive sleep apnea (adult) (pediatric): Secondary | ICD-10-CM | POA: Diagnosis not present

## 2022-08-20 DIAGNOSIS — G4733 Obstructive sleep apnea (adult) (pediatric): Secondary | ICD-10-CM | POA: Diagnosis not present

## 2022-08-22 ENCOUNTER — Encounter: Payer: Medicare Other | Attending: Family Medicine | Admitting: Dietician

## 2022-08-22 DIAGNOSIS — Z794 Long term (current) use of insulin: Secondary | ICD-10-CM | POA: Diagnosis not present

## 2022-08-22 DIAGNOSIS — Z713 Dietary counseling and surveillance: Secondary | ICD-10-CM | POA: Diagnosis not present

## 2022-08-22 DIAGNOSIS — E1122 Type 2 diabetes mellitus with diabetic chronic kidney disease: Secondary | ICD-10-CM | POA: Diagnosis not present

## 2022-08-22 DIAGNOSIS — N1832 Chronic kidney disease, stage 3b: Secondary | ICD-10-CM | POA: Insufficient documentation

## 2022-08-22 NOTE — Patient Instructions (Signed)
Find ways to decrease your sodium:  Avoid added salt  Ask for your meals to be prepared without salt when out  Read labels and choose those with less sodium  Avoid processed meats and high sodium snacks  Eat at home more often  Cook simply  Avoid seasonings with potassium in the ingredient list. Avoid foods with Phos... in the ingredient list.  Blood glucose goals:  80-130 fasting   100-180 two hours after starting any meal  Generally a rise of 40-60 points after a meal is normal    Low sodium options: [employer] Silver palate low sodium pasta sauce

## 2022-08-22 NOTE — Telephone Encounter (Signed)
Talked with [name], She states that she has rec'd a bill for $85.00 twice. This is for the months March and April. She also has re'd that suppiles. She has not opened. She would like to resend these back to the company and the charges be dropped. She also would like clarification  about where the Dexcom came from.  Whitney , can you help with the Dexcom?

## 2022-08-22 NOTE — Telephone Encounter (Signed)
I called the company, they state that they were waiting on a prescription from [place] for the strips, Lancets, ect. They are asking if the patient is wanting to cancel her account with them all together., this is with [facility]. I told Margarita Grizzle , the representative that I spoke to , that I would call the patient back and make certain as to what she was wanting to do , to give me till Thursday of this week.

## 2022-08-22 NOTE — Progress Notes (Signed)
Medical Nutrition Therapy  Appointment Start time:  [phone]  Appointment End time:  65  Primary concerns today: She would like to learn more about nutrition that is good for diabetes and CKD Referral diagnosis: Type 2 diabetes and stage 3b CKD  Preferred learning style: no preference indicated Learning readiness: ready   NUTRITION ASSESSMENT  She is asking about Omega 3.  She reports that her cholesterol medications are making her achy.  She reports increased restless leg as well. She states that she is cold all the time and verbalized that her iron is low. She is getting frequent low sensor readings.  She had a steroid injection about 1 month ago and MD increased insulin at that time and it has not been lowered. CGM Dexcom G6  Time in Range 80%  High 16%  Very high 1% Low 2% Very low 0% Average glucose over the past 7 days 137  Anthropometrics  65: 328 lbs 08/22/2022 skipped her fluid pill yesterday 318 lbs 08/09/2022  UBW   Clinical Medical Hx: Type 2 Diabetes, CKD stage 2b, lymphedema, OSA c-pap, GERD, HTN, HLD, anemia, vitamin D deficiency Medications/Supplements: 50 units q am and 40 units q HS Humulin 70/30 , Ozempic, finofibrate, rosuvastatin, torsemide, potassium, calcitriol, Vitamin D3 Labs: A1C 7.5% 07/19/2022, Hgb 11.7, iron 50, TIBC 437, Iron Saturation 11, BUN 28, Creatinine 1.85, eGFR 30, Potassium 4.6, Phos 39, Urine protein 28 on 08/16/2022, Cholesterol 268, HDL 65, LDL 180, Triglycerides 127 on 07/19/2022, vitamin D 23 08/2021. Notable Signs/Symptoms: swelling feet, fluid weight gain  Lifestyle & Dietary Hx Patient lives with her husband.  They share shopping and cooking.  She uses an Web designer at the store.  Eats out 3-4 times per week (asks for a to-go box at the beginning of the meal) She is on disability.  She used to work as an Teaching laboratory technician for 40 years. Avoids pork, uses I can't believe it's not butter, raw vegetables make her nauseas Has dentures and unable  to tolerate hard food  Estimated daily fluid intake: 64 oz fluid restriction Sleep: 4-6 hours "better with c-pap" Stress / self-care: none Current average weekly physical activity: lymphedema and knee problems prohibit  24-Hr Dietary Recall First Meal: Glucerna OR banana OR multigrain cheerios or frosted flakes with 2% fairlife milk, occasional fruit and raisin toast with cream cheese OR occasional chicken or steak biscuit OR regular oats with spenda or raisins and butter and occasional yogurt Snack: occasional PB crackers or pork skins or yogurt Second Meal: baked chicken or fish, vegetables, mashed or baked potatoes or rice, occasional fruit OR slushie OR hamburger on a bun, 1/2 portion fries Snack: occasional PB crackers or pork skins or yogurt or cobbler Third Meal: brunswick stew OR chicken noodle soup OR similar to lunch Snack: low salt low fat popcorn OR sugar free ice cream and extra if her blood glucose is low Beverages: water, Glucerna (about 2-3 per week), sugar free twist beverages, regular gingerale, regular lemonade (She uses   Estimated Energy Needs Protein: 85-95 g  NUTRITION DIAGNOSIS  NB-1.1 Food and nutrition-related knowledge deficit As related to balance of carbohydrates, protein, and fat along with guidelines for CKD.  As evidenced by diet hx and patient report.   NUTRITION INTERVENTION  Nutrition education (E-1) on the following topics:  Label reading Tips to decrease sodium Dining out tips Salt substitutes containing potassium should be avoided and alternative seasoning ideas Simple meal planning Blood glucose goals and blood glucose monitoring  Handouts  Provided Include  NKD national kidney diet - Dish up a Kidney-Friendly Meal for Patients with Chronic Kidney Disease (not on dialysis) Label reading [employer] of Lifestyle Medicine packet.  Learning Style & Readiness for Change Teaching method utilized: Visual & Auditory  Demonstrated degree  of understanding via: Teach Back  Barriers to learning/adherence to lifestyle change: health issues  Goals Find ways to decrease your sodium:  Avoid added salt  Ask for your meals to be prepared without salt when out  Read labels and choose those with less sodium  Avoid processed meats and high sodium snacks  Eat at home more often  Cook simply  Avoid seasonings with potassium in the ingredient list. Avoid foods with Phos... in the ingredient list.  Blood glucose goals:  80-130 fasting   100-180 two hours after starting any meal  Generally a rise of 40-60 points after a meal is normal  Low sodium options: [employer] Silver palate low sodium pasta sauce    MONITORING & EVALUATION Dietary intake, weekly physical activity, and label reading in 2 months.  Next Steps  Patient is to call for questions.

## 2022-08-23 ENCOUNTER — Ambulatory Visit (HOSPITAL_COMMUNITY): Payer: Medicare Other | Admitting: Physical Therapy

## 2022-08-23 DIAGNOSIS — E1129 Type 2 diabetes mellitus with other diabetic kidney complication: Secondary | ICD-10-CM | POA: Diagnosis not present

## 2022-08-23 DIAGNOSIS — N189 Chronic kidney disease, unspecified: Secondary | ICD-10-CM | POA: Diagnosis not present

## 2022-08-23 DIAGNOSIS — I890 Lymphedema, not elsewhere classified: Secondary | ICD-10-CM

## 2022-08-23 DIAGNOSIS — M5459 Other low back pain: Secondary | ICD-10-CM | POA: Diagnosis not present

## 2022-08-23 DIAGNOSIS — M25561 Pain in right knee: Secondary | ICD-10-CM | POA: Diagnosis not present

## 2022-08-23 DIAGNOSIS — E1122 Type 2 diabetes mellitus with diabetic chronic kidney disease: Secondary | ICD-10-CM | POA: Diagnosis not present

## 2022-08-23 DIAGNOSIS — R809 Proteinuria, unspecified: Secondary | ICD-10-CM | POA: Diagnosis not present

## 2022-08-23 DIAGNOSIS — G4733 Obstructive sleep apnea (adult) (pediatric): Secondary | ICD-10-CM | POA: Diagnosis not present

## 2022-08-23 DIAGNOSIS — M25562 Pain in left knee: Secondary | ICD-10-CM | POA: Diagnosis not present

## 2022-08-23 DIAGNOSIS — G8929 Other chronic pain: Secondary | ICD-10-CM | POA: Diagnosis not present

## 2022-08-23 DIAGNOSIS — I129 Hypertensive chronic kidney disease with stage 1 through stage 4 chronic kidney disease, or unspecified chronic kidney disease: Secondary | ICD-10-CM | POA: Diagnosis not present

## 2022-08-23 DIAGNOSIS — E611 Iron deficiency: Secondary | ICD-10-CM | POA: Diagnosis not present

## 2022-08-23 DIAGNOSIS — I5032 Chronic diastolic (congestive) heart failure: Secondary | ICD-10-CM | POA: Diagnosis not present

## 2022-08-23 DIAGNOSIS — M6281 Muscle weakness (generalized): Secondary | ICD-10-CM | POA: Diagnosis not present

## 2022-08-23 NOTE — Telephone Encounter (Signed)
Her Dexcom came from [employer]
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
## 2022-08-23 NOTE — Telephone Encounter (Signed)
Talked with Mickel Baas with the medical supply company. She states that the 30 day window to return the March and April order of lancets and strips had exceeded the return time window. She states that they sent out some more on September 25, and when the patient gets those she can return them. Mickel Baas ask that the patient call them to share  if she wants to cancel her whole account with them , and to talk with the billing department about the $85 bill for March and April.  I have called and left this message with [name], asking that she please call them.

## 2022-08-25 ENCOUNTER — Encounter (HOSPITAL_COMMUNITY): Payer: Self-pay

## 2022-08-25 ENCOUNTER — Ambulatory Visit (HOSPITAL_COMMUNITY): Payer: Medicare Other

## 2022-08-25 DIAGNOSIS — M6281 Muscle weakness (generalized): Secondary | ICD-10-CM | POA: Diagnosis not present

## 2022-08-25 DIAGNOSIS — M5459 Other low back pain: Secondary | ICD-10-CM | POA: Diagnosis not present

## 2022-08-25 DIAGNOSIS — G8929 Other chronic pain: Secondary | ICD-10-CM

## 2022-08-25 DIAGNOSIS — M25561 Pain in right knee: Secondary | ICD-10-CM | POA: Diagnosis not present

## 2022-08-25 DIAGNOSIS — I890 Lymphedema, not elsewhere classified: Secondary | ICD-10-CM | POA: Diagnosis not present

## 2022-08-25 DIAGNOSIS — M25562 Pain in left knee: Secondary | ICD-10-CM | POA: Diagnosis not present
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
## 2022-08-28 DIAGNOSIS — E1165 Type 2 diabetes mellitus with hyperglycemia: Secondary | ICD-10-CM | POA: Diagnosis not present

## 2022-08-29 ENCOUNTER — Encounter (INDEPENDENT_AMBULATORY_CARE_PROVIDER_SITE_OTHER): Payer: Self-pay | Admitting: *Deleted

## 2022-08-29 DIAGNOSIS — M170 Bilateral primary osteoarthritis of knee: Secondary | ICD-10-CM | POA: Diagnosis not present

## 2022-08-31 ENCOUNTER — Encounter: Payer: Self-pay | Admitting: Pulmonary Disease

## 2022-08-31 ENCOUNTER — Ambulatory Visit (INDEPENDENT_AMBULATORY_CARE_PROVIDER_SITE_OTHER): Payer: Medicare Other | Admitting: Pulmonary Disease

## 2022-08-31 DIAGNOSIS — R053 Chronic cough: Secondary | ICD-10-CM | POA: Diagnosis not present

## 2022-08-31 DIAGNOSIS — G4733 Obstructive sleep apnea (adult) (pediatric): Secondary | ICD-10-CM

## 2022-08-31 NOTE — Assessment & Plan Note (Signed)
Appears to have subsided.  She is not on bronchodilators. Dyspnea was attributed to diastolic dysfunction and weight gain

## 2022-08-31 NOTE — Patient Instructions (Addendum)
  Try to use CPAP at least 6h every night

## 2022-08-31 NOTE — Progress Notes (Signed)
   Subjective:    [name], female    DOB: 03-28-1961, 61 y.o.   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 She gained about 50 pounds and changed to auto CPAP 5 to 12 cm on her follow-up visit 03/2022  PMH - CKD -3 BLE lymphedema OA  Chief Complaint  Patient presents with   Follow-up    Cpap working well    She reported dyspnea on exertion in April, did dimer was high and underwent VQ scan which was negative.  Echo 10/2021 did not show any evidence of pulmonary hypertension. Her cough has decreased after she was treated for asthma by her allergy doctor. Her iron levels are low and are being repleted. Due to her weight gain we placed her on auto CPAP 5 to 12 cm after her last visit 3 months ago.  She is trying to settle down with this.  She has nasal pillows and a fullface mask.  She does feel better rested when she uses the machine for 6 to 8 hours.  She has only been able to use it for the duration for a couple of nights. Reports mild dryness, pressure is okay.   Significant tests/ events reviewed  02/2022 VQ scan normal Spirometry 01/2022 ratio 78, FEV1 54%, FVC 55% Spirometry 01/2022 ratio 74, FEV1 62%, FVC 66% 09/2013 NPSG -274 pounds -AHI 16/hour, events were mostly noted during REM sleep but desaturation to 70s, PLM 18/hour   12/2013 CPAP titration >> 7 cm, nasal pillows  Review of Systems neg for any significant sore throat, dysphagia, itching, sneezing, nasal congestion or excess/ purulent secretions, fever, chills, sweats, unintended wt loss, pleuritic or exertional cp, hempoptysis, orthopnea pnd or change in chronic leg swelling. Also denies presyncope, palpitations, heartburn, abdominal pain, nausea, vomiting, diarrhea or change in bowel or urinary habits, dysuria,hematuria, rash, arthralgias, visual complaints, headache, numbness weakness or ataxia.      Objective:   Physical Exam  Gen. Pleasant, obese, in no distress ENT - no lesions, no post nasal drip Neck: No JVD, no thyromegaly, no carotid bruits Lungs: no use of accessory muscles, no dullness to percussion, decreased without rales or rhonchi  Cardiovascular: Rhythm regular, heart sounds  normal, no murmurs or gallops, no peripheral edema Musculoskeletal: No deformities, no cyanosis or clubbing , no tremors       Assessment & Plan:

## 2022-08-31 NOTE — Assessment & Plan Note (Signed)
CPAP download was reviewed which shows excellent control of events on auto settings with average pressure of 11 cm.  Average usage is 4 hours She has used it 24 out of the last 30 days.  There is a mild leak.  I encouraged her to use her CPAP at least 6 hours every night.  We discussed cardiovascular implications of untreated OSA  Weight loss encouraged, compliance with goal of at least 4-6 hrs every night is the expectation. Advised against medications with sedative side effects Cautioned against driving when sleepy - understanding that sleepiness will vary on a day to day basis

## 2022-09-01 ENCOUNTER — Encounter (HOSPITAL_COMMUNITY): Payer: Medicare Other

## 2022-09-05 DIAGNOSIS — M170 Bilateral primary osteoarthritis of knee: Secondary | ICD-10-CM | POA: Diagnosis not present

## 2022-09-05 DIAGNOSIS — M545 Low back pain, unspecified: Secondary | ICD-10-CM | POA: Diagnosis not present

## 2022-09-07 ENCOUNTER — Encounter (HOSPITAL_COMMUNITY): Payer: Medicare Other

## 2022-09-08 ENCOUNTER — Encounter (HOSPITAL_COMMUNITY): Payer: Medicare Other

## 2022-09-08 DIAGNOSIS — G4733 Obstructive sleep apnea (adult) (pediatric): Secondary | ICD-10-CM | POA: Diagnosis not present

## 2022-09-12 ENCOUNTER — Encounter (HOSPITAL_COMMUNITY): Payer: Medicare Other | Admitting: Physical Therapy

## 2022-09-12 DIAGNOSIS — M170 Bilateral primary osteoarthritis of knee: Secondary | ICD-10-CM | POA: Diagnosis not present

## 2022-09-14 ENCOUNTER — Encounter (HOSPITAL_COMMUNITY): Payer: Medicare Other | Admitting: Physical Therapy

## 2022-09-17 DIAGNOSIS — G4733 Obstructive sleep apnea (adult) (pediatric): Secondary | ICD-10-CM | POA: Diagnosis not present

## 2022-09-18 ENCOUNTER — Encounter (HOSPITAL_COMMUNITY): Payer: Medicare Other | Admitting: Physical Therapy

## 2022-09-19 ENCOUNTER — Encounter (HOSPITAL_COMMUNITY): Payer: Medicare Other

## 2022-09-19 DIAGNOSIS — G4733 Obstructive sleep apnea (adult) (pediatric): Secondary | ICD-10-CM | POA: Diagnosis not present

## 2022-09-19 DIAGNOSIS — M4316 Spondylolisthesis, lumbar region: Secondary | ICD-10-CM | POA: Diagnosis not present

## 2022-09-19 DIAGNOSIS — M5440 Lumbago with sciatica, unspecified side: Secondary | ICD-10-CM | POA: Diagnosis not present

## 2022-09-20 ENCOUNTER — Encounter (HOSPITAL_COMMUNITY): Payer: Medicare Other

## 2022-09-20 DIAGNOSIS — G4733 Obstructive sleep apnea (adult) (pediatric): Secondary | ICD-10-CM | POA: Diagnosis not present

## 2022-09-22 ENCOUNTER — Encounter (HOSPITAL_COMMUNITY): Payer: Medicare Other | Admitting: Physical Therapy

## 2022-09-25 ENCOUNTER — Encounter (HOSPITAL_COMMUNITY): Payer: Medicare Other | Admitting: Physical Therapy

## 2022-09-26 ENCOUNTER — Encounter: Payer: Self-pay | Admitting: Nurse Practitioner

## 2022-09-26 ENCOUNTER — Ambulatory Visit (INDEPENDENT_AMBULATORY_CARE_PROVIDER_SITE_OTHER): Payer: Medicare Other | Admitting: Nurse Practitioner

## 2022-09-26 ENCOUNTER — Ambulatory Visit: Payer: Medicare Other | Admitting: Nurse Practitioner

## 2022-09-26 VITALS — BP 122/70 | HR 86 | Ht 65.0 in | Wt 317.6 lb

## 2022-09-26 DIAGNOSIS — I10 Essential (primary) hypertension: Secondary | ICD-10-CM | POA: Diagnosis not present

## 2022-09-26 DIAGNOSIS — E559 Vitamin D deficiency, unspecified: Secondary | ICD-10-CM | POA: Diagnosis not present

## 2022-09-26 DIAGNOSIS — E1122 Type 2 diabetes mellitus with diabetic chronic kidney disease: Secondary | ICD-10-CM | POA: Diagnosis not present

## 2022-09-26 DIAGNOSIS — Z794 Long term (current) use of insulin: Secondary | ICD-10-CM

## 2022-09-26 DIAGNOSIS — N1832 Chronic kidney disease, stage 3b: Secondary | ICD-10-CM

## 2022-09-26 LAB — POCT GLYCOSYLATED HEMOGLOBIN (HGB A1C): Hemoglobin A1C: 6.5 % — AB (ref 4.0–5.6)

## 2022-09-26 MED ORDER — HUMULIN 70/30 KWIKPEN (70-30) 100 UNIT/ML ~~LOC~~ SUPN: 20.0000 [IU] | PEN_INJECTOR | Freq: Two times a day (BID) | SUBCUTANEOUS | 3 refills | Status: DC
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
## 2022-09-27 ENCOUNTER — Encounter (HOSPITAL_COMMUNITY): Payer: Medicare Other | Admitting: Physical Therapy

## 2022-09-28 DIAGNOSIS — E1165 Type 2 diabetes mellitus with hyperglycemia: Secondary | ICD-10-CM | POA: Diagnosis not present

## 2022-10-02 ENCOUNTER — Encounter (HOSPITAL_COMMUNITY): Payer: Medicare Other | Admitting: Physical Therapy

## 2022-10-03 ENCOUNTER — Encounter (HOSPITAL_COMMUNITY): Payer: Medicare Other | Admitting: Physical Therapy

## 2022-10-04 ENCOUNTER — Encounter (HOSPITAL_COMMUNITY): Payer: Medicare Other | Admitting: Physical Therapy

## 2022-10-09 ENCOUNTER — Encounter (HOSPITAL_COMMUNITY): Payer: Medicare Other | Admitting: Physical Therapy

## 2022-10-09 ENCOUNTER — Other Ambulatory Visit: Payer: Self-pay | Admitting: Family Medicine

## 2022-10-10 DIAGNOSIS — M170 Bilateral primary osteoarthritis of knee: Secondary | ICD-10-CM | POA: Diagnosis not present

## 2022-10-10 DIAGNOSIS — M545 Low back pain, unspecified: Secondary | ICD-10-CM | POA: Diagnosis not present

## 2022-10-11 ENCOUNTER — Encounter (HOSPITAL_COMMUNITY): Payer: Medicare Other

## 2022-10-13 ENCOUNTER — Encounter (HOSPITAL_COMMUNITY): Payer: Medicare Other

## 2022-10-16 ENCOUNTER — Encounter (HOSPITAL_COMMUNITY): Payer: Medicare Other | Admitting: Physical Therapy

## 2022-10-18 ENCOUNTER — Encounter (HOSPITAL_COMMUNITY): Payer: Medicare Other | Admitting: Physical Therapy

## 2022-10-19 DIAGNOSIS — G4733 Obstructive sleep apnea (adult) (pediatric): Secondary | ICD-10-CM | POA: Diagnosis not present

## 2022-10-20 ENCOUNTER — Encounter (HOSPITAL_COMMUNITY): Payer: Medicare Other

## 2022-10-23 ENCOUNTER — Ambulatory Visit (INDEPENDENT_AMBULATORY_CARE_PROVIDER_SITE_OTHER): Payer: Medicare Other

## 2022-10-23 ENCOUNTER — Encounter: Payer: Medicare Other | Attending: Nurse Practitioner | Admitting: Dietician

## 2022-10-23 ENCOUNTER — Encounter (HOSPITAL_COMMUNITY): Payer: Medicare Other | Admitting: Physical Therapy

## 2022-10-23 DIAGNOSIS — Z0000 Encounter for general adult medical examination without abnormal findings: Secondary | ICD-10-CM

## 2022-10-23 DIAGNOSIS — N183 Chronic kidney disease, stage 3 unspecified: Secondary | ICD-10-CM | POA: Insufficient documentation

## 2022-10-23 DIAGNOSIS — Z794 Long term (current) use of insulin: Secondary | ICD-10-CM | POA: Insufficient documentation

## 2022-10-23 DIAGNOSIS — E1122 Type 2 diabetes mellitus with diabetic chronic kidney disease: Secondary | ICD-10-CM | POA: Diagnosis not present

## 2022-10-23 NOTE — Patient Instructions (Addendum)
Continue to follow a low sodium diet and cook most of your foods at home. When eating out, consider using the calorie king app to make lower sodium choices (less than 600 mg for a meal) or choose restaurants that will prepare your food as ordered without salt. Eat more cooked vegetables.

## 2022-10-23 NOTE — Patient Instructions (Signed)
  Kendra Hanson , Thank you for taking time to come for your Medicare Wellness Visit. I appreciate your ongoing commitment to your health goals. Please review the following plan we discussed and let me know if I can assist you in the future.   These are the goals we discussed:  Goals       Patient Stated (pt-stated)      Referral entered to diabetic educator/nutritionist.      Patient Stated      Keep brain active by working crossword puzzles, games and brain teasers      Patient Stated (pt-stated)      Knees to feel better      Prevent falls        This is a list of the screening recommended for you and due dates:  Health Maintenance  Topic Date Due   Yearly kidney health urinalysis for diabetes  02/02/2021   Eye exam for diabetics  12/29/2021   COVID-19 Vaccine (4 - 2023-24 season) 07/14/2022   Yearly kidney function blood test for diabetes  03/03/2023   Hemoglobin A1C  03/27/2023   Complete foot exam   09/27/2023   DTaP/Tdap/Td vaccine (2 - Td or Tdap) 10/09/2023   Medicare Annual Wellness Visit  10/24/2023   Mammogram  01/17/2024   Pap Smear  03/08/2024   Colon Cancer Screening  04/04/2025   Flu Shot  Completed   Hepatitis C Screening: USPSTF Recommendation to screen - Ages 18-79 yo.  Completed   HIV Screening  Completed   Zoster (Shingles) Vaccine  Completed   HPV Vaccine  Aged Out

## 2022-10-23 NOTE — Progress Notes (Signed)
Medical Nutrition Therapy  Appointment Start time:  [phone]  Appointment End time:  1615 Phone visit.  Patient is at Kendra Hanson home and I am at my office.  She was last seen by myself 08/22/2022.    Primary concerns today: She would like to learn more about nutrition that is good for diabetes and CKD Referral diagnosis: Type 2 diabetes and stage 3b CKD  Preferred learning style: no preference indicated Learning readiness: ready   NUTRITION ASSESSMENT  A1C has decreased since last visit. CGM Dexcom G6  Current sensor reading 200  Time in Range 63% x 14 days  GMI 7.2% and reported more steroid injections which could have affected this.  Low blood glucose yesterday of 51 which improved after treatment She states the goal is to come off insulin. Preparing food without insulin now. Increased pain recently.  Continued problems with lymphedema.   Anthropometrics  65" 317 lbs 09/26/2022 328 lbs 08/22/2022 skipped Kendra Hanson fluid pill yesterday 318 lbs 08/09/2022  UBW  Clinical Medical Hx: Type 2 Diabetes, CKD stage 2b, lymphedema, OSA c-pap, GERD, HTN, HLD, anemia, vitamin D deficiency Medications/Supplements: 30 units q am and 20 units q HS Humulin 70/30 , Ozempic, finofibrate, rosuvastatin, torsemide, potassium, calcitriol, Vitamin D3 Labs: A1C 6.5% 11/14/20237.5% 07/19/2022, Hgb 11.7, iron 50, TIBC 437, Iron Saturation 11, BUN 28, Creatinine 1.85, eGFR 30, Potassium 4.6, Phos 39, Urine protein 28 on 08/16/2022, Cholesterol 268, HDL 65, LDL 180, Triglycerides 127 on 07/19/2022, vitamin D 23 08/2021. Notable Signs/Symptoms: swelling feet, fluid weight gain  Lifestyle & Dietary Hx Patient lives with Kendra Hanson husband.  They share shopping and cooking.  She uses an Web designer at the store.  Eats out 3-4 times per week (asks for a to-go box at the beginning of the meal) She is on disability.  She used to work as an Teaching laboratory technician for 40 years. Avoids pork, uses I can't believe it's not butter, raw vegetables make  Kendra Hanson nauseas Has dentures and unable to tolerate hard food  Estimated daily fluid intake: 64 oz fluid restriction Sleep: 4-6 hours "better with c-pap" Stress / self-care: none Current average weekly physical activity: lymphedema and knee problems prohibit  24-Hr Dietary Recall First Meal: Glucerna OR banana OR multigrain cheerios or frosted flakes with 2% fairlife milk, occasional fruit and raisin toast with cream cheese OR occasional chicken or steak biscuit OR regular oats with spenda or raisins and butter and occasional yogurt skipped today Snack: occasional PB crackers or pork skins or yogurt Second Meal: baked chicken or fish, vegetables, mashed or baked potatoes or rice, occasional fruit OR slushie OR hamburger on a bun, 1/2 portion fries OR ribs and regular gingerale today Snack: occasional PB crackers or pork skins or yogurt or cobbler Third Meal: brunswick stew OR chicken noodle soup OR similar to lunch OR ribs, pig ears, potato salad, pintos, 1/4 piece cornbread last night Snack: low salt low fat popcorn OR sugar free ice cream and extra if Kendra Hanson blood glucose is low Beverages: water, Glucerna (about 2-3 per week But not recently), sugar free twist beverages, regular gingerale, regular lemonade  Estimated Energy Needs Protein: 85-95 g  NUTRITION DIAGNOSIS  NB-1.1 Food and nutrition-related knowledge deficit As related to balance of carbohydrates, protein, and fat along with guidelines for CKD.  As evidenced by diet hx and patient report.   NUTRITION INTERVENTION  Nutrition education (E-1) on the following topics:  Calorie King app to help make mindful (lower sodium) choices Eating out tips CGM review  Handouts Provided Include - previous visit NKD national kidney diet - Dish up a Kidney-Friendly Meal for Patients with Chronic Kidney Disease (not on dialysis) Label reading [employer] of Lifestyle Medicine packet.  Learning Style & Readiness for Change Teaching  method utilized: Visual & Auditory  Demonstrated degree of understanding via: Teach Back  Barriers to learning/adherence to lifestyle change: health issues  Goals Continue to follow a low sodium diet and cook most of your foods at home. When eating out, consider using the calorie king app to make lower sodium choices (less than 600 mg for a meal) or choose restaurants that will prepare your food as ordered without salt. Eat more cooked vegetables.  Find ways to decrease your sodium:  Avoid added salt  Ask for your meals to be prepared without salt when out  Read labels and choose those with less sodium  Avoid processed meats and high sodium snacks  Eat at home more often  Cook simply  Avoid seasonings with potassium in the ingredient list. Avoid foods with Phos... in the ingredient list.  Blood glucose goals:  80-130 fasting   100-180 two hours after starting any meal  Generally a rise of 40-60 points after a meal is normal  Low sodium options: [employer] Silver palate low sodium pasta sauce    MONITORING & EVALUATION Dietary intake, weekly physical activity, and label reading in 2 months.  Next Steps  Patient is to call for questions.
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
## 2022-10-25 ENCOUNTER — Encounter (HOSPITAL_COMMUNITY): Payer: Medicare Other | Admitting: Physical Therapy

## 2022-10-26 ENCOUNTER — Encounter (HOSPITAL_COMMUNITY): Payer: Medicare Other | Admitting: Physical Therapy

## 2022-10-27 ENCOUNTER — Encounter (HOSPITAL_COMMUNITY): Payer: Medicare Other

## 2022-10-28 DIAGNOSIS — E1165 Type 2 diabetes mellitus with hyperglycemia: Secondary | ICD-10-CM | POA: Diagnosis not present

## 2022-10-31 DIAGNOSIS — H00013 Hordeolum externum right eye, unspecified eyelid: Secondary | ICD-10-CM | POA: Diagnosis not present

## 2022-10-31 DIAGNOSIS — M170 Bilateral primary osteoarthritis of knee: Secondary | ICD-10-CM | POA: Diagnosis not present

## 2022-11-01 ENCOUNTER — Encounter (HOSPITAL_COMMUNITY): Payer: Self-pay | Admitting: Physical Therapy

## 2022-11-01 NOTE — Therapy (Signed)
[place], [place], [address] Phone: [phone]   Fax:  [phone]  Patient Details  Name: Kendra Hanson MRN: [id number] Date of Birth: [birth year] Referring Provider:  No ref. provider found  Encounter Date: 11/01/2022  PHYSICAL THERAPY DISCHARGE SUMMARY  Visits from Start of Care: 3  Current functional level related to goals / functional outcomes: Unknown as pt did not return    Remaining deficits: Unknown as pt did not return    Education / Equipment: HEP  Patient agrees to discharge. Patient goals were Unknown as pt did not return . Patient is being discharged due to not returning since the last visit.  Rayetta Humphrey, PT CLT [phone]  11/01/2022, 11:51 AM  [place], [place], [address] Phone: [phone]   Fax:  [phone]

## 2022-11-02 ENCOUNTER — Other Ambulatory Visit: Payer: Self-pay | Admitting: Family Medicine

## 2022-11-03 ENCOUNTER — Other Ambulatory Visit: Payer: Self-pay

## 2022-11-03 ENCOUNTER — Encounter: Payer: Self-pay | Admitting: Family Medicine

## 2022-11-03 ENCOUNTER — Telehealth: Payer: Self-pay | Admitting: Family Medicine

## 2022-11-03 MED ORDER — AMLODIPINE BESYLATE 5 MG PO TABS: 5.0000 mg | ORAL_TABLET | Freq: Every day | ORAL | 0 refills | Status: DC

## 2022-11-03 MED ORDER — DILTIAZEM HCL ER 420 MG PO TB24: 1.0000 | ORAL_TABLET | Freq: Every day | ORAL | 5 refills | Status: DC

## 2022-11-03 NOTE — Telephone Encounter (Signed)
Refills sent

## 2022-11-03 NOTE — Telephone Encounter (Signed)
Med refill:  dilTIAZem HCl ER (CARDIZEM LA) 420 MG TB24 [[id number]]   Patient says she thinks this is the medicine for blood pressure.  Patient needs her blood pressure medicine refilled.  Pharmacy: [facility]

## 2022-11-13 ENCOUNTER — Encounter: Payer: Self-pay | Admitting: Family Medicine

## 2022-11-14 DIAGNOSIS — Z794 Long term (current) use of insulin: Secondary | ICD-10-CM | POA: Diagnosis not present

## 2022-11-14 DIAGNOSIS — I10 Essential (primary) hypertension: Secondary | ICD-10-CM | POA: Diagnosis not present

## 2022-11-14 DIAGNOSIS — N183 Chronic kidney disease, stage 3 unspecified: Secondary | ICD-10-CM | POA: Diagnosis not present

## 2022-11-14 DIAGNOSIS — E1122 Type 2 diabetes mellitus with diabetic chronic kidney disease: Secondary | ICD-10-CM | POA: Diagnosis not present

## 2022-11-15 LAB — CMP14+EGFR
ALT: 9 IU/L (ref 0–32)
AST: 12 IU/L (ref 0–40)
Albumin/Globulin Ratio: 1.4 (ref 1.2–2.2)
Albumin: 4 g/dL (ref 3.9–4.9)
Alkaline Phosphatase: 100 IU/L (ref 44–121)
BUN/Creatinine Ratio: 18 (ref 12–28)
BUN: 22 mg/dL (ref 8–27)
Bilirubin Total: 0.4 mg/dL (ref 0.0–1.2)
CO2: 23 mmol/L (ref 20–29)
Calcium: 9.8 mg/dL (ref 8.7–10.3)
Chloride: 102 mmol/L (ref 96–106)
Creatinine, Ser: 1.23 mg/dL — ABNORMAL HIGH (ref 0.57–1.00)
Globulin, Total: 2.8 g/dL (ref 1.5–4.5)
Glucose: 109 mg/dL — ABNORMAL HIGH (ref 70–99)
Potassium: 4.7 mmol/L (ref 3.5–5.2)
Sodium: 142 mmol/L (ref 134–144)
Total Protein: 6.8 g/dL (ref 6.0–8.5)
eGFR: 50 mL/min/{1.73_m2} — ABNORMAL LOW (ref >59)

## 2022-11-15 LAB — LIPID PANEL
Chol/HDL Ratio: 4.8 ratio — ABNORMAL HIGH (ref 0.0–4.4)
Cholesterol, Total: 275 mg/dL — ABNORMAL HIGH (ref 100–199)
HDL: 57 mg/dL (ref >39)
LDL Chol Calc (NIH): 192 mg/dL — ABNORMAL HIGH (ref 0–99)
Triglycerides: 142 mg/dL (ref 0–149)
VLDL Cholesterol Cal: 26 mg/dL (ref 5–40)

## 2022-11-16 ENCOUNTER — Ambulatory Visit (INDEPENDENT_AMBULATORY_CARE_PROVIDER_SITE_OTHER): Payer: Medicare Other | Admitting: Gastroenterology

## 2022-11-17 ENCOUNTER — Encounter: Payer: Self-pay | Admitting: Family Medicine

## 2022-11-17 ENCOUNTER — Ambulatory Visit (INDEPENDENT_AMBULATORY_CARE_PROVIDER_SITE_OTHER): Payer: Medicare Other | Admitting: Family Medicine

## 2022-11-17 VITALS — BP 148/70 | HR 92 | Ht 65.0 in | Wt 312.0 lb

## 2022-11-17 DIAGNOSIS — I890 Lymphedema, not elsewhere classified: Secondary | ICD-10-CM | POA: Diagnosis not present

## 2022-11-17 DIAGNOSIS — N183 Chronic kidney disease, stage 3 unspecified: Secondary | ICD-10-CM

## 2022-11-17 DIAGNOSIS — E785 Hyperlipidemia, unspecified: Secondary | ICD-10-CM | POA: Diagnosis not present

## 2022-11-17 DIAGNOSIS — I10 Essential (primary) hypertension: Secondary | ICD-10-CM | POA: Diagnosis not present

## 2022-11-17 DIAGNOSIS — M170 Bilateral primary osteoarthritis of knee: Secondary | ICD-10-CM | POA: Diagnosis not present

## 2022-11-17 DIAGNOSIS — Z794 Long term (current) use of insulin: Secondary | ICD-10-CM | POA: Diagnosis not present

## 2022-11-17 DIAGNOSIS — E1122 Type 2 diabetes mellitus with diabetic chronic kidney disease: Secondary | ICD-10-CM | POA: Diagnosis not present

## 2022-11-17 MED ORDER — REPATHA 140 MG/ML ~~LOC~~ SOSY: 140.0000 mg | PREFILLED_SYRINGE | SUBCUTANEOUS | 5 refills | Status: DC

## 2022-11-17 MED ORDER — CARVEDILOL 3.125 MG PO TABS: 3.1250 mg | ORAL_TABLET | Freq: Two times a day (BID) | ORAL | 3 refills | Status: DC

## 2022-11-17 MED ORDER — STUDY - EVOLVE-MI - EVOLOCUMAB (REPATHA) 140 MG/ML SQ INJECTION (PI-STUCKEY): 140.0000 mg | INJECTION | SUBCUTANEOUS | 5 refills | Status: DC

## 2022-11-17 NOTE — Patient Instructions (Signed)
Follow-up in 6 to 8 weeks, reevaluate blood pressure, call if you need me sooner.  New medication for blood pressure is carvedilol 3.125 mg 1 twice daily.  Continue medications that you are currently taking.  You will not be resuming amlodipine.   For cholesterol is Repatha 1 injection every 2 weeks Please reduce fried and fatty foods.  I am referring back you back to the vascular surgeon regarding lymphedema.  Congratulations on improved blood sugar and weight loss.  Thanks for choosing [facility], we consider it a privelige to serve you.

## 2022-11-18 ENCOUNTER — Encounter: Payer: Self-pay | Admitting: Family Medicine

## 2022-11-18 LAB — SPECIMEN STATUS REPORT

## 2022-11-18 LAB — TSH: TSH: 2.49 u[IU]/mL (ref 0.450–4.500)

## 2022-11-19 ENCOUNTER — Encounter: Payer: Self-pay | Admitting: Family Medicine

## 2022-11-19 DIAGNOSIS — G4733 Obstructive sleep apnea (adult) (pediatric): Secondary | ICD-10-CM | POA: Diagnosis not present

## 2022-11-19 DIAGNOSIS — I10 Essential (primary) hypertension: Secondary | ICD-10-CM | POA: Insufficient documentation

## 2022-11-19 NOTE — Assessment & Plan Note (Signed)
Increased pain and stiffness reported, encouraged weight loss, managed by Ortho receives intra articular injections with some benefit

## 2022-11-19 NOTE — Assessment & Plan Note (Signed)
Improving gradually  Patient re-educated about  the importance of commitment to a  minimum of 150 minutes of exercise per week as able.  The importance of healthy food choices with portion control discussed, as well as eating regularly and within a 12 hour window most days. The need to choose "clean , green" food 50 to 75% of the time is discussed, as well as to make water the primary drink and set a goal of 64 ounces water daily.       11/17/2022   10:23 AM 09/26/2022    3:38 PM 08/31/2022   11:10 AM  Weight /BMI  Weight 312 lb 0.6 oz 317 lb 9.6 oz 324 lb 3.2 oz  Height '5\' 5"'$  (1.651 m) '5\' 5"'$  (1.651 m) '5\' 5"'$  (1.651 m)  BMI 51.93 kg/m2 52.85 kg/m2 53.95 kg/m2

## 2022-11-19 NOTE — Assessment & Plan Note (Signed)
Hyperlipidemia:Low fat diet discussed and encouraged.   Lipid Panel  Lab Results  Component Value Date   CHOL 275 (H) 11/14/2022   HDL 57 11/14/2022   LDLCALC 192 (H) 11/14/2022   TRIG 142 11/14/2022   CHOLHDL 4.8 (H) 11/14/2022     Unontrolled reports muscle aches with statin and fenofibrate, trial of repatha

## 2022-11-19 NOTE — Assessment & Plan Note (Signed)
Uncontrolled, intolerant of amlodipine, chane tocoreg, and re eval in 6 to 8 weeks DASH diet and commitment to daily physical activity for a minimum of 30 minutes discussed and encouraged, as a part of hypertension management. The importance of attaining a healthy weight is also discussed.     11/17/2022   10:57 AM 11/17/2022   10:30 AM 11/17/2022   10:23 AM 09/26/2022    3:38 PM 08/31/2022   11:10 AM 08/22/2022    9:00 AM 08/09/2022   11:16 AM  BP/Weight  Systolic BP 982 641 583 094 076  808  Diastolic BP 70 76 77 70 84  68  Wt. (Lbs)   312.04 317.6 324.2 328 318  BMI   51.93 kg/m2 52.85 kg/m2 53.95 kg/m2 54.58 kg/m2 52.92 kg/m2     '

## 2022-11-19 NOTE — Assessment & Plan Note (Signed)
Manged by endo and controlled which is great! Kendra Hanson is reminded of the importance of commitment to daily physical activity for 30 minutes or more, as able and the need to limit carbohydrate intake to 30 to 60 grams per meal to help with blood sugar control.   The need to take medication as prescribed, test blood sugar as directed, and to call between visits if there is a concern that blood sugar is uncontrolled is also discussed.   Kendra Hanson is reminded of the importance of daily foot exam, annual eye examination, and good blood sugar, blood pressure and cholesterol control.     Latest Ref Rng & Units 11/14/2022   11:52 AM 09/26/2022    3:56 PM 07/19/2022    1:32 PM 06/26/2022    3:34 PM 03/06/2022    4:38 PM  Diabetic Labs  HbA1c 4.0 - 5.6 %  6.5   7.5    Chol 100 - 199 mg/dL 275   268   267   HDL >39 mg/dL 57   65   85   Calc LDL 0 - 99 mg/dL 192   180   166   Triglycerides 0 - 149 mg/dL 142   127   95   Creatinine 0.57 - 1.00 mg/dL 1.23           11/17/2022   10:57 AM 11/17/2022   10:30 AM 11/17/2022   10:23 AM 09/26/2022    3:38 PM 08/31/2022   11:10 AM 08/22/2022    9:00 AM 08/09/2022   11:16 AM  BP/Weight  Systolic BP 465 681 275 170 017  494  Diastolic BP 70 76 77 70 84  68  Wt. (Lbs)   312.04 317.6 324.2 328 318  BMI   51.93 kg/m2 52.85 kg/m2 53.95 kg/m2 54.58 kg/m2 52.92 kg/m2      09/26/2022    3:30 PM 08/16/2021   10:00 AM  Foot/eye exam completion dates  Foot Form Completion Done Done

## 2022-11-19 NOTE — Assessment & Plan Note (Signed)
Reports worsening symptoms , wants to be fitted for compression stockings and receive rx for pump, refer back to vascular  for ongoing care

## 2022-11-19 NOTE — Progress Notes (Signed)
Kendra Hanson     MRN: [id number]      DOB: 1961-08-12   HPI Kendra Hanson is here for follow up and re-evaluation of chronic medical conditions, medication management and review of any available recent lab and radiology data.  Preventive health is updated, specifically  Cancer screening and Immunization.   Questions or concerns regarding consultations or procedures which the PT has had in the interim are  addressed. Nephrology follow up going well, and kidney function improved The PT denies any adverse reactions to current medications since the last visit.  C/o bilateral leg swelling also knee pain, is seeing vascular and Ortho fro each of these problems and is advised to keep follow up and continue to work on weight loss ROS Denies recent fever or chills. Denies sinus pressure, nasal congestion, ear pain or sore throat. Denies chest congestion, productive cough or wheezing. Denies chest pains, palpitations and leg swelling Denies abdominal pain, nausea, vomiting,diarrhea or constipation.   Denies dysuria, frequency, hesitancy or incontinence.  Denies depression, anxiety or insomnia. Denies skin break down or rash.   PE  BP (!) 148/70   Pulse 92   Ht '5\' 5"'$  (1.651 m)   Wt (!) 312 lb 0.6 oz (141.5 kg)   SpO2 94%   BMI 51.93 kg/m   Patient alert and oriented and in no cardiopulmonary distress.  HEENT: No facial asymmetry, EOMI,     Neck supple .  Chest: Clear to auscultation bilaterally.  CVS: S1, S2 no murmurs, no S3.Regular rate.  ABD: Soft non tender.   Ext: trace  edema  MS: decreased  ROM spine,  hips and knees.  Skin: Intact, hyperpigmentation of lower extremities Psych: Good eye contact, normal affect. Memory intact not anxious or depressed appearing.  CNS: CN 2-12 intact, power,  normal throughout.no focal deficits noted.   Assessment & Plan  Primary hypertension Uncontrolled, intolerant of amlodipine, chane tocoreg, and re eval in 6 to 8 weeks DASH  diet and commitment to daily physical activity for a minimum of 30 minutes discussed and encouraged, as a part of hypertension management. The importance of attaining a healthy weight is also discussed.     11/17/2022   10:57 AM 11/17/2022   10:30 AM 11/17/2022   10:23 AM 09/26/2022    3:38 PM 08/31/2022   11:10 AM 08/22/2022    9:00 AM 08/09/2022   11:16 AM  BP/Weight  Systolic BP 528 413 244 010 272  536  Diastolic BP 70 76 77 70 84  68  Wt. (Lbs)   312.04 317.6 324.2 328 318  BMI   51.93 kg/m2 52.85 kg/m2 53.95 kg/m2 54.58 kg/m2 52.92 kg/m2     '  Type 2 diabetes mellitus ([place]) Manged by endo and controlled which is great! Kendra Hanson is reminded of the importance of commitment to daily physical activity for 30 minutes or more, as able and the need to limit carbohydrate intake to 30 to 60 grams per meal to help with blood sugar control.   The need to take medication as prescribed, test blood sugar as directed, and to call between visits if there is a concern that blood sugar is uncontrolled is also discussed.   Kendra Hanson is reminded of the importance of daily foot exam, annual eye examination, and good blood sugar, blood pressure and cholesterol control.     Latest Ref Rng & Units 11/14/2022   11:52 AM 09/26/2022    3:56 PM 07/19/2022    1:32  PM 06/26/2022    3:34 PM 03/06/2022    4:38 PM  Diabetic Labs  HbA1c 4.0 - 5.6 %  6.5   7.5    Chol 100 - 199 mg/dL 275   268   267   HDL >39 mg/dL 57   65   85   Calc LDL 0 - 99 mg/dL 192   180   166   Triglycerides 0 - 149 mg/dL 142   127   95   Creatinine 0.57 - 1.00 mg/dL 1.23           11/17/2022   10:57 AM 11/17/2022   10:30 AM 11/17/2022   10:23 AM 09/26/2022    3:38 PM 08/31/2022   11:10 AM 08/22/2022    9:00 AM 08/09/2022   11:16 AM  BP/Weight  Systolic BP 532 023 343 568 616  837  Diastolic BP 70 76 77 70 84  68  Wt. (Lbs)   312.04 317.6 324.2 328 318  BMI   51.93 kg/m2 52.85 kg/m2 53.95 kg/m2 54.58 kg/m2 52.92 kg/m2       09/26/2022    3:30 PM 08/16/2021   10:00 AM  Foot/eye exam completion dates  Foot Form Completion Done Done        Osteoarthritis of both knees Increased pain and stiffness reported, encouraged weight loss, managed by Ortho receives intra articular injections with some benefit  Morbid obesity ([place]) Improving gradually  Patient re-educated about  the importance of commitment to a  minimum of 150 minutes of exercise per week as able.  The importance of healthy food choices with portion control discussed, as well as eating regularly and within a 12 hour window most days. The need to choose "clean , green" food 50 to 75% of the time is discussed, as well as to make water the primary drink and set a goal of 64 ounces water daily.       11/17/2022   10:23 AM 09/26/2022    3:38 PM 08/31/2022   11:10 AM  Weight /BMI  Weight 312 lb 0.6 oz 317 lb 9.6 oz 324 lb 3.2 oz  Height '5\' 5"'$  (1.651 m) '5\' 5"'$  (1.651 m) '5\' 5"'$  (1.651 m)  BMI 51.93 kg/m2 52.85 kg/m2 53.95 kg/m2      Lymphedema Reports worsening symptoms , wants to be fitted for compression stockings and receive rx for pump, refer back to vascular  for ongoing care  Hyperlipidemia LDL goal <100 Hyperlipidemia:Low fat diet discussed and encouraged.   Lipid Panel  Lab Results  Component Value Date   CHOL 275 (H) 11/14/2022   HDL 57 11/14/2022   LDLCALC 192 (H) 11/14/2022   TRIG 142 11/14/2022   CHOLHDL 4.8 (H) 11/14/2022     Unontrolled reports muscle aches with statin and fenofibrate, trial of repatha

## 2022-11-24 ENCOUNTER — Other Ambulatory Visit: Payer: Self-pay | Admitting: *Deleted

## 2022-11-24 DIAGNOSIS — I890 Lymphedema, not elsewhere classified: Secondary | ICD-10-CM

## 2022-11-24 DIAGNOSIS — I872 Venous insufficiency (chronic) (peripheral): Secondary | ICD-10-CM

## 2022-11-28 ENCOUNTER — Encounter: Payer: Self-pay | Admitting: Nurse Practitioner

## 2022-11-28 DIAGNOSIS — E1165 Type 2 diabetes mellitus with hyperglycemia: Secondary | ICD-10-CM | POA: Diagnosis not present

## 2022-11-30 ENCOUNTER — Ambulatory Visit (HOSPITAL_COMMUNITY)
Admission: RE | Admit: 2022-11-30 | Discharge: 2022-11-30 | Disposition: A | Discharge status: Home / Self Care | Payer: Medicare Other | Source: Ambulatory Visit | Attending: Vascular Surgery | Admitting: Vascular Surgery

## 2022-11-30 ENCOUNTER — Ambulatory Visit: Payer: Medicare Other | Admitting: Physician Assistant

## 2022-11-30 VITALS — BP 137/77 | HR 71 | Temp 97.7°F | Resp 20 | Ht 65.0 in | Wt 306.1 lb

## 2022-11-30 DIAGNOSIS — I872 Venous insufficiency (chronic) (peripheral): Secondary | ICD-10-CM | POA: Diagnosis not present

## 2022-11-30 DIAGNOSIS — M79605 Pain in left leg: Secondary | ICD-10-CM

## 2022-11-30 DIAGNOSIS — I890 Lymphedema, not elsewhere classified: Secondary | ICD-10-CM | POA: Diagnosis not present
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
## 2022-12-05 ENCOUNTER — Telehealth: Payer: Self-pay

## 2022-12-05 NOTE — Telephone Encounter (Signed)
Returned pt's call requesting compression hose rx for custom fit pantyhose style compression. Rx has been faxed to requested location. Pt is aware.

## 2022-12-11 DIAGNOSIS — E1122 Type 2 diabetes mellitus with diabetic chronic kidney disease: Secondary | ICD-10-CM | POA: Diagnosis not present

## 2022-12-11 DIAGNOSIS — E611 Iron deficiency: Secondary | ICD-10-CM | POA: Diagnosis not present

## 2022-12-11 DIAGNOSIS — E1129 Type 2 diabetes mellitus with other diabetic kidney complication: Secondary | ICD-10-CM | POA: Diagnosis not present

## 2022-12-11 DIAGNOSIS — I5032 Chronic diastolic (congestive) heart failure: Secondary | ICD-10-CM | POA: Diagnosis not present

## 2022-12-11 DIAGNOSIS — N189 Chronic kidney disease, unspecified: Secondary | ICD-10-CM | POA: Diagnosis not present

## 2022-12-11 LAB — MICROALBUMIN, URINE: Microalb, Ur: <4

## 2022-12-13 ENCOUNTER — Telehealth: Payer: Self-pay

## 2022-12-13 NOTE — Telephone Encounter (Signed)
Pt called this week with same question as last week, which we discussed insurance coverage of compression stockings. She requested a rx sent to a location where she is purchasing custom stockings from. She is asking if we know if insurance will cover them. I have advised her that she will need to call her insurance company, as we do not know what they will cover.

## 2022-12-18 DIAGNOSIS — E1129 Type 2 diabetes mellitus with other diabetic kidney complication: Secondary | ICD-10-CM | POA: Diagnosis not present

## 2022-12-18 DIAGNOSIS — R809 Proteinuria, unspecified: Secondary | ICD-10-CM | POA: Diagnosis not present

## 2022-12-18 DIAGNOSIS — N189 Chronic kidney disease, unspecified: Secondary | ICD-10-CM | POA: Diagnosis not present

## 2022-12-18 DIAGNOSIS — G4733 Obstructive sleep apnea (adult) (pediatric): Secondary | ICD-10-CM | POA: Diagnosis not present

## 2022-12-18 DIAGNOSIS — E1122 Type 2 diabetes mellitus with diabetic chronic kidney disease: Secondary | ICD-10-CM | POA: Diagnosis not present

## 2022-12-18 DIAGNOSIS — I129 Hypertensive chronic kidney disease with stage 1 through stage 4 chronic kidney disease, or unspecified chronic kidney disease: Secondary | ICD-10-CM | POA: Diagnosis not present

## 2022-12-18 DIAGNOSIS — E611 Iron deficiency: Secondary | ICD-10-CM | POA: Diagnosis not present

## 2022-12-18 DIAGNOSIS — I5032 Chronic diastolic (congestive) heart failure: Secondary | ICD-10-CM | POA: Diagnosis not present

## 2022-12-20 DIAGNOSIS — G4733 Obstructive sleep apnea (adult) (pediatric): Secondary | ICD-10-CM | POA: Diagnosis not present

## 2022-12-25 ENCOUNTER — Encounter: Payer: Medicare Other | Attending: Nurse Practitioner | Admitting: Dietician

## 2022-12-25 ENCOUNTER — Encounter: Payer: Self-pay | Admitting: Dietician

## 2022-12-25 DIAGNOSIS — N183 Chronic kidney disease, stage 3 unspecified: Secondary | ICD-10-CM | POA: Insufficient documentation

## 2022-12-25 DIAGNOSIS — E1122 Type 2 diabetes mellitus with diabetic chronic kidney disease: Secondary | ICD-10-CM | POA: Diagnosis not present

## 2022-12-25 DIAGNOSIS — Z794 Long term (current) use of insulin: Secondary | ICD-10-CM

## 2022-12-25 NOTE — Progress Notes (Unsigned)
Medical Nutrition Therapy  Appointment Start time:  [id number]  Appointment End time:  [id number] Phone visit.  Patient is at her home and I am at my office.  She was last seen by myself 10/23/22    Primary concerns today: She would like to learn more about nutrition that is good for diabetes and CKD Referral diagnosis: Type 2 diabetes and stage 3b CKD  Preferred learning style: no preference indicated Learning readiness: ready  NUTRITION ASSESSMENT  Dexcom falling off due to her body wash - explained that she should clean skin with alcohol prior to applying.  She is currently not using the Dexcom. Kidney labs improved A1C decreased and cholesterol improved Lymphedema improved considerably since she is using wraps recommended by MD Mobility improved but pain continues She has decreased her sodium intake and is eating smaller portions.  She is filling up quickly. She is taking an Alive vitamin and has stopped the vitamin D per MD advise as her MVI contains vitamin D.  Anthropometrics  65" 306 lbs 12/24/2022 317 lbs 09/26/2022 328 lbs 08/22/2022 skipped her fluid pill yesterday 318 lbs 08/09/2022  UBW  Clinical Medical Hx: Type 2 Diabetes, CKD stage 2b, lymphedema, OSA c-pap (using this more (2/24), GERD, HTN, HLD, anemia, vitamin D deficiency Medications/Supplements: 30 units q am and 20 units q HS Humulin 70/30 , Ozempic, torsemide, potassium, calcitriol, MVI, Repatha Labs: A1C 6.5% 11/14/20237.5% 07/19/2022, Hgb 11.7  BUN 26, Creatinine 1.51, Potassium 4.3, eGFR 39 (12/11/2022) Iron 39, TIBC 273, Iron Saturation 14 (12/11/2022)  Lipid Panel     Component Value Date/Time   CHOL 275 (H) 11/14/2022 1152   TRIG 142 11/14/2022 1152   HDL 57 11/14/2022 1152   CHOLHDL 4.8 (H) 11/14/2022 1152   CHOLHDL 4.1 09/02/2019 0846   VLDL 15 04/12/2017 1121   LDLCALC 192 (H) 11/14/2022 1152   LDLCALC 131 (H) 09/02/2019 0846   LABVLDL 26 11/14/2022 1152    Notable Signs/Symptoms: swelling feet, fluid  weight gain  Lifestyle & Dietary Hx Patient lives with her husband.  They share shopping and cooking.  She uses an Web designer at the store.  Eats out 3-4 times per week (asks for a to-go box at the beginning of the meal) She is on disability.  She used to work as an Teaching laboratory technician for 40 years. Avoids pork, uses I can't believe it's not butter, raw vegetables make her nauseas Has dentures and unable to tolerate hard food  Estimated daily fluid intake: 64 oz fluid restriction Sleep: 4-6 hours "better with c-pap" Stress / self-care: none Current average weekly physical activity: lymphedema and knee problems prohibit  24-Hr Dietary Recall First Meal:  skipped Second Meal: Pinto beans Snack: applesauce Third Meal:  salmon, asparagus, 1/2 potato, cake without icing (rare choice) Beverages: water, Glucerna (about 2-3 per week But not recently due to diarrhea), sugar free twist beverages,   Estimated Energy Needs Protein: 85-95 g  NUTRITION DIAGNOSIS  NB-1.1 Food and nutrition-related knowledge deficit As related to balance of carbohydrates, protein, and fat along with guidelines for CKD.  As evidenced by diet hx and patient report.  NUTRITION INTERVENTION  Nutrition education (E-1) on the following topics:  Calorie King app to help make mindful (lower sodium) choices Eating out tips CGM review  Handouts Provided Include - previous visit [facility] national kidney diet - Dish up a Kidney-Friendly Meal for Patients with Chronic Kidney Disease (not on dialysis) Label reading [employer] of Lifestyle Medicine packet.  Learning Style &  Readiness for Change Teaching method utilized: Visual & Auditory  Demonstrated degree of understanding via: Teach Back  Barriers to learning/adherence to lifestyle change: health issues  Goals Small meals throughout the day -Breakfast, lunch, dinner Avoid skipping meals Continue to avoid salt Read labels Eat at home most often Cook  Simply  Avoid seasonings with potassium in the ingredient list. Avoid foods with Phos... in the ingredient list.  Blood glucose goals:  80-130 fasting   100-180 two hours after starting any meal  Generally a rise of 40-60 points after a meal is normal  Low sodium options: [employer] Silver palate low sodium pasta sauce    MONITORING & EVALUATION Dietary intake, weekly physical activity, and label reading in 2 months.  Next Steps  Patient is to call for questions.

## 2022-12-26 NOTE — Patient Instructions (Addendum)
Small meals throughout the day -Breakfast, lunch, dinner Avoid skipping meals Continue to avoid salt Read labels Eat at home most often Cook Simply  Avoid seasonings with potassium in the ingredient list. Avoid foods with Phos... in the ingredient list.  Blood glucose goals:  80-130 fasting   100-180 two hours after starting any meal  Generally a rise of 40-60 points after a meal is normal  Low sodium options: [employer] Silver palate low sodium pasta sauce

## 2022-12-29 ENCOUNTER — Encounter: Payer: Self-pay | Admitting: Family Medicine

## 2022-12-29 ENCOUNTER — Telehealth: Payer: Self-pay | Admitting: Family Medicine

## 2022-12-29 ENCOUNTER — Ambulatory Visit (INDEPENDENT_AMBULATORY_CARE_PROVIDER_SITE_OTHER): Payer: Medicare Other | Admitting: Family Medicine

## 2022-12-29 VITALS — BP 138/70 | HR 82 | Ht 65.0 in | Wt 304.0 lb

## 2022-12-29 DIAGNOSIS — N183 Chronic kidney disease, stage 3 unspecified: Secondary | ICD-10-CM

## 2022-12-29 DIAGNOSIS — I10 Essential (primary) hypertension: Secondary | ICD-10-CM

## 2022-12-29 DIAGNOSIS — E1165 Type 2 diabetes mellitus with hyperglycemia: Secondary | ICD-10-CM | POA: Diagnosis not present

## 2022-12-29 DIAGNOSIS — E1122 Type 2 diabetes mellitus with diabetic chronic kidney disease: Secondary | ICD-10-CM

## 2022-12-29 DIAGNOSIS — E785 Hyperlipidemia, unspecified: Secondary | ICD-10-CM | POA: Diagnosis not present

## 2022-12-29 DIAGNOSIS — Z794 Long term (current) use of insulin: Secondary | ICD-10-CM | POA: Diagnosis not present

## 2022-12-29 DIAGNOSIS — M170 Bilateral primary osteoarthritis of knee: Secondary | ICD-10-CM | POA: Diagnosis not present

## 2022-12-29 DIAGNOSIS — Z1231 Encounter for screening mammogram for malignant neoplasm of breast: Secondary | ICD-10-CM

## 2022-12-29 DIAGNOSIS — I150 Renovascular hypertension: Secondary | ICD-10-CM | POA: Diagnosis not present

## 2022-12-29 MED ORDER — CARVEDILOL 6.25 MG PO TABS: 6.2500 mg | ORAL_TABLET | Freq: Two times a day (BID) | ORAL | 3 refills | Status: DC

## 2022-12-29 NOTE — Patient Instructions (Addendum)
Follow-up April 6 or shortly after, call if you need me sooner.  New higher dose of carvedilol 6.25 mg 1 twice daily to get your blood pressure to the goal of 130/80 or less.  Congratulations on improved overall health, keep this up.  Please get fasting lipid CMP and EGFR improved to or shortly after at least 2 days before your next appointment.  Please schedule mammogram at checkout today  Nurse pls send for Eye exam in Fall 2023, "[facility]" Debe Coder  If you have not had a covid vaccine since Oct 2023, this is recommended, also the RSV vaccine  Thanks for choosing [facility], we consider it a privelige to serve you.

## 2022-12-29 NOTE — Telephone Encounter (Signed)
FMLA  Copied Noted sleeved

## 2022-12-31 ENCOUNTER — Encounter: Payer: Self-pay | Admitting: Family Medicine

## 2022-12-31 NOTE — Assessment & Plan Note (Signed)
Deteriorated  Patient re-educated about  the importance of commitment to a  minimum of 150 minutes of exercise per week as able.  The importance of healthy food choices with portion control discussed, as well as eating regularly and within a 12 hour window most days. The need to choose "clean , green" food 50 to 75% of the time is discussed, as well as to make water the primary drink and set a goal of 64 ounces water daily.       12/29/2022    9:51 AM 11/30/2022   12:59 PM 11/17/2022   10:23 AM  Weight /BMI  Weight 304 lb 306 lb 1.6 oz 312 lb 0.6 oz  Height 5' 5"$  (1.651 m) 5' 5"$  (1.651 m) 5' 5"$  (1.651 m)  BMI 50.59 kg/m2 50.94 kg/m2 51.93 kg/m2

## 2022-12-31 NOTE — Assessment & Plan Note (Signed)
Increase coreg dose DASH diet and commitment to daily physical activity for a minimum of 30 minutes discussed and encouraged, as a part of hypertension management. The importance of attaining a healthy weight is also discussed.     12/29/2022   10:36 AM 12/29/2022    9:57 AM 12/29/2022    9:51 AM 11/30/2022   12:59 PM 11/17/2022   10:57 AM 11/17/2022   10:30 AM 11/17/2022   10:23 AM  BP/Weight  Systolic BP 0000000 A999333 Q000111Q 0000000 123456 [id number]  Diastolic BP 70 80 77 77 70 76 77  Wt. (Lbs)   304 306.1   312.04  BMI   50.59 kg/m2 50.94 kg/m2   51.93 kg/m2

## 2022-12-31 NOTE — Assessment & Plan Note (Signed)
Hyperlipidemia:Low fat diet discussed and encouraged.   Lipid Panel  Lab Results  Component Value Date   CHOL 275 (H) 11/14/2022   HDL 57 11/14/2022   LDLCALC 192 (H) 11/14/2022   TRIG 142 11/14/2022   CHOLHDL 4.8 (H) 11/14/2022     Uncontrolled Updated lab needed at/ before next visit.

## 2022-12-31 NOTE — Progress Notes (Signed)
Kendra Hanson     MRN: [id number]      DOB: [birth year]   HPI Kendra Hanson is here for follow up and re-evaluation of chronic medical conditions, medication management and review of any available recent lab and radiology data.  Preventive health is updated, specifically  Cancer screening and Immunization.   Questions or concerns regarding consultations or procedures which the PT has had in the interim are  addressed. The PT denies any adverse reactions to current medications since the last visit.  There are no new concerns.  There are no specific complaints   ROS Denies recent fever or chills. Denies sinus pressure, nasal congestion, ear pain or sore throat. Denies chest congestion, productive cough or wheezing. Denies chest pains, palpitations and leg swelling Denies abdominal pain, nausea, vomiting,diarrhea or constipation.   Denies dysuria, frequency, hesitancy or incontinence. Chronic  joint pain, swelling and limitation in mobility. Denies headaches, seizures, numbness, or tingling. Denies depression, anxiety or insomnia. Denies skin break down or rash.   PE  BP 138/70   Pulse 82   Ht 5' 5"$  (1.651 m)   Wt (!) 304 lb (137.9 kg)   SpO2 91%   BMI 50.59 kg/m   Patient alert and oriented and in no cardiopulmonary distress.  HEENT: No facial asymmetry, EOMI,     Neck supple .  Chest: Clear to auscultation bilaterally.  CVS: S1, S2 no murmurs, no S3.Regular rate.  ABD: Soft non tender.   Ext: No edema  MS: Decreased  ROM spine, shoulders, hips and knees.  Skin: Intact, no ulcerations or rash noted.  Psych: Good eye contact, normal affect. Memory intact not anxious or depressed appearing.  CNS: CN 2-12 intact, power,  normal throughout.no focal deficits noted.   Assessment & Plan  Hypertensive disorder Increase coreg dose DASH diet and commitment to daily physical activity for a minimum of 30 minutes discussed and encouraged, as a part of hypertension  management. The importance of attaining a healthy weight is also discussed.     12/29/2022   10:36 AM 12/29/2022    9:57 AM 12/29/2022    9:51 AM 11/30/2022   12:59 PM 11/17/2022   10:57 AM 11/17/2022   10:30 AM 11/17/2022   10:23 AM  BP/Weight  Systolic BP 0000000 [id number] Q000111Q 0000000 123456 [id number] [id number]  Diastolic BP 70 80 77 77 70 76 77  Wt. (Lbs)   304 306.1   312.04  BMI   50.59 kg/m2 50.94 kg/m2   51.93 kg/m2       Morbid obesity ([place]) Deteriorated  Patient re-educated about  the importance of commitment to a  minimum of 150 minutes of exercise per week as able.  The importance of healthy food choices with portion control discussed, as well as eating regularly and within a 12 hour window most days. The need to choose "clean , green" food 50 to 75% of the time is discussed, as well as to make water the primary drink and set a goal of 64 ounces water daily.       12/29/2022    9:51 AM 11/30/2022   12:59 PM 11/17/2022   10:23 AM  Weight /BMI  Weight 304 lb 306 lb 1.6 oz 312 lb 0.6 oz  Height 5' 5"$  (1.651 m) 5' 5"$  (1.651 m) 5' 5"$  (1.651 m)  BMI 50.59 kg/m2 50.94 kg/m2 51.93 kg/m2      Hyperlipidemia LDL goal <100 Hyperlipidemia:Low fat diet discussed and encouraged.   Lipid Panel  Lab Results  Component Value Date   CHOL 275 (H) 11/14/2022   HDL 57 11/14/2022   LDLCALC 192 (H) 11/14/2022   TRIG 142 11/14/2022   CHOLHDL 4.8 (H) 11/14/2022     Uncontrolled Updated lab needed at/ before next visit.   Type 2 diabetes mellitus ([place]) Kendra Hanson is reminded of the importance of commitment to daily physical activity for 30 minutes or more, as able and the need to limit carbohydrate intake to 30 to 60 grams per meal to help with blood sugar control.   The need to take medication as prescribed, test blood sugar as directed, and to call between visits if there is a concern that blood sugar is uncontrolled is also discussed.   Kendra Hanson is reminded of the importance of daily foot exam,  annual eye examination, and good blood sugar, blood pressure and cholesterol control.     Latest Ref Rng & Units 11/14/2022   11:52 AM 09/26/2022    3:56 PM 07/19/2022    1:32 PM 06/26/2022    3:34 PM 03/06/2022    4:38 PM  Diabetic Labs  HbA1c 4.0 - 5.6 %  6.5   7.5    Chol 100 - 199 mg/dL 275   268   267   HDL >39 mg/dL 57   65   85   Calc LDL 0 - 99 mg/dL 192   180   166   Triglycerides 0 - 149 mg/dL 142   127   95   Creatinine 0.57 - 1.00 mg/dL 1.23           12/29/2022   10:36 AM 12/29/2022    9:57 AM 12/29/2022    9:51 AM 11/30/2022   12:59 PM 11/17/2022   10:57 AM 11/17/2022   10:30 AM 11/17/2022   10:23 AM  BP/Weight  Systolic BP 0000000 [id number] Q000111Q 0000000 123456 [id number] [id number]  Diastolic BP 70 80 77 77 70 76 77  Wt. (Lbs)   304 306.1   312.04  BMI   50.59 kg/m2 50.94 kg/m2   51.93 kg/m2      09/26/2022    3:30 PM 08/16/2021   10:00 AM  Foot/eye exam completion dates  Foot Form Completion Done Done      Controlled and managed by Endo  Osteoarthritis of both knees Chronic pain and debility, receiving intra articular injections with some benefit Will continue to ry to lose weight

## 2022-12-31 NOTE — Assessment & Plan Note (Signed)
Kendra Hanson is reminded of the importance of commitment to daily physical activity for 30 minutes or more, as able and the need to limit carbohydrate intake to 30 to 60 grams per meal to help with blood sugar control.   The need to take medication as prescribed, test blood sugar as directed, and to call between visits if there is a concern that blood sugar is uncontrolled is also discussed.   Kendra Hanson is reminded of the importance of daily foot exam, annual eye examination, and good blood sugar, blood pressure and cholesterol control.     Latest Ref Rng & Units 11/14/2022   11:52 AM 09/26/2022    3:56 PM 07/19/2022    1:32 PM 06/26/2022    3:34 PM 03/06/2022    4:38 PM  Diabetic Labs  HbA1c 4.0 - 5.6 %  6.5   7.5    Chol 100 - 199 mg/dL 275   268   267   HDL >39 mg/dL 57   65   85   Calc LDL 0 - 99 mg/dL 192   180   166   Triglycerides 0 - 149 mg/dL 142   127   95   Creatinine 0.57 - 1.00 mg/dL 1.23           12/29/2022   10:36 AM 12/29/2022    9:57 AM 12/29/2022    9:51 AM 11/30/2022   12:59 PM 11/17/2022   10:57 AM 11/17/2022   10:30 AM 11/17/2022   10:23 AM  BP/Weight  Systolic BP 0000000 [id number] Q000111Q 0000000 123456 [id number] [id number]  Diastolic BP 70 80 77 77 70 76 77  Wt. (Lbs)   304 306.1   312.04  BMI   50.59 kg/m2 50.94 kg/m2   51.93 kg/m2      09/26/2022    3:30 PM 08/16/2021   10:00 AM  Foot/eye exam completion dates  Foot Form Completion Done Done      Controlled and managed by Endo

## 2022-12-31 NOTE — Assessment & Plan Note (Signed)
Chronic pain and debility, receiving intra articular injections with some benefit Will continue to ry to lose weight

## 2023-01-01 ENCOUNTER — Encounter: Payer: Self-pay | Admitting: Nurse Practitioner

## 2023-01-01 NOTE — Telephone Encounter (Signed)
She needs her Ozempic 2 mg and the Dexcom G7. She states that she gets this from [employer] and says that we had sent in the paper work. She calls the number to speak to someone but is on hold for a very long time, never talks with anyone. I did give her a sample of 1 G7 sensor. Husband has picked up already.

## 2023-01-01 NOTE — Telephone Encounter (Signed)
Can you call patient and find out what she needs?

## 2023-01-02 NOTE — Telephone Encounter (Signed)
Kendra Hanson, can you please follow up with her Ozempic patient assistance?

## 2023-01-09 DIAGNOSIS — Z0279 Encounter for issue of other medical certificate: Secondary | ICD-10-CM

## 2023-01-15 NOTE — Telephone Encounter (Signed)
Called patient, will pick up a copy and forms faxed to [phone]

## 2023-01-17 NOTE — Telephone Encounter (Signed)
See patient note

## 2023-01-18 DIAGNOSIS — G4733 Obstructive sleep apnea (adult) (pediatric): Secondary | ICD-10-CM | POA: Diagnosis not present

## 2023-01-22 ENCOUNTER — Ambulatory Visit (HOSPITAL_COMMUNITY)
Admission: RE | Admit: 2023-01-22 | Discharge: 2023-01-22 | Disposition: A | Discharge status: Home / Self Care | Payer: Medicare Other | Source: Ambulatory Visit | Attending: Family Medicine | Admitting: Family Medicine

## 2023-01-22 DIAGNOSIS — Z1231 Encounter for screening mammogram for malignant neoplasm of breast: Secondary | ICD-10-CM | POA: Diagnosis not present

## 2023-01-25 ENCOUNTER — Ambulatory Visit (INDEPENDENT_AMBULATORY_CARE_PROVIDER_SITE_OTHER): Payer: Medicare Other | Admitting: Nurse Practitioner

## 2023-01-25 ENCOUNTER — Encounter: Payer: Self-pay | Admitting: Nurse Practitioner

## 2023-01-25 VITALS — BP 108/74 | HR 72 | Ht 65.0 in | Wt 301.0 lb

## 2023-01-25 DIAGNOSIS — I10 Essential (primary) hypertension: Secondary | ICD-10-CM | POA: Diagnosis not present

## 2023-01-25 DIAGNOSIS — Z794 Long term (current) use of insulin: Secondary | ICD-10-CM | POA: Diagnosis not present

## 2023-01-25 DIAGNOSIS — E559 Vitamin D deficiency, unspecified: Secondary | ICD-10-CM | POA: Diagnosis not present

## 2023-01-25 DIAGNOSIS — N1832 Chronic kidney disease, stage 3b: Secondary | ICD-10-CM | POA: Diagnosis not present

## 2023-01-25 DIAGNOSIS — E782 Mixed hyperlipidemia: Secondary | ICD-10-CM

## 2023-01-25 DIAGNOSIS — E1122 Type 2 diabetes mellitus with diabetic chronic kidney disease: Secondary | ICD-10-CM | POA: Diagnosis not present

## 2023-01-25 LAB — POCT GLYCOSYLATED HEMOGLOBIN (HGB A1C): Hemoglobin A1C: 6.5 % — AB (ref 4.0–5.6)
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
## 2023-01-27 DIAGNOSIS — E1165 Type 2 diabetes mellitus with hyperglycemia: Secondary | ICD-10-CM | POA: Diagnosis not present

## 2023-01-31 ENCOUNTER — Other Ambulatory Visit: Payer: Self-pay

## 2023-02-05 ENCOUNTER — Other Ambulatory Visit: Payer: Self-pay | Admitting: Family Medicine

## 2023-02-05 DIAGNOSIS — E876 Hypokalemia: Secondary | ICD-10-CM

## 2023-02-06 ENCOUNTER — Telehealth: Payer: Self-pay | Admitting: Nurse Practitioner

## 2023-02-06 NOTE — Telephone Encounter (Signed)
Pt picked up pt assistance of Novolog (2 pens).

## 2023-02-13 DIAGNOSIS — E785 Hyperlipidemia, unspecified: Secondary | ICD-10-CM | POA: Diagnosis not present

## 2023-02-13 DIAGNOSIS — I10 Essential (primary) hypertension: Secondary | ICD-10-CM | POA: Diagnosis not present

## 2023-02-14 ENCOUNTER — Encounter: Payer: Self-pay | Admitting: Nurse Practitioner

## 2023-02-14 DIAGNOSIS — M170 Bilateral primary osteoarthritis of knee: Secondary | ICD-10-CM | POA: Diagnosis not present

## 2023-02-14 LAB — CMP14+EGFR
ALT: 10 IU/L (ref 0–32)
AST: 12 IU/L (ref 0–40)
Albumin/Globulin Ratio: 1.7 (ref 1.2–2.2)
Albumin: 4.3 g/dL (ref 3.9–4.9)
Alkaline Phosphatase: 140 IU/L — ABNORMAL HIGH (ref 44–121)
BUN/Creatinine Ratio: 19 (ref 12–28)
BUN: 23 mg/dL (ref 8–27)
Bilirubin Total: 0.4 mg/dL (ref 0.0–1.2)
CO2: 25 mmol/L (ref 20–29)
Calcium: 9.5 mg/dL (ref 8.7–10.3)
Chloride: 102 mmol/L (ref 96–106)
Creatinine, Ser: 1.22 mg/dL — ABNORMAL HIGH (ref 0.57–1.00)
Globulin, Total: 2.5 g/dL (ref 1.5–4.5)
Glucose: 137 mg/dL — ABNORMAL HIGH (ref 70–99)
Potassium: 4.6 mmol/L (ref 3.5–5.2)
Sodium: 141 mmol/L (ref 134–144)
Total Protein: 6.8 g/dL (ref 6.0–8.5)
eGFR: 50 mL/min/{1.73_m2} — ABNORMAL LOW (ref >59)

## 2023-02-14 LAB — LIPID PANEL
Chol/HDL Ratio: 3.8 ratio (ref 0.0–4.4)
Cholesterol, Total: 218 mg/dL — ABNORMAL HIGH (ref 100–199)
HDL: 57 mg/dL (ref >39)
LDL Chol Calc (NIH): 144 mg/dL — ABNORMAL HIGH (ref 0–99)
Triglycerides: 94 mg/dL (ref 0–149)
VLDL Cholesterol Cal: 17 mg/dL (ref 5–40)

## 2023-02-15 ENCOUNTER — Encounter: Payer: Self-pay | Admitting: Family Medicine

## 2023-02-15 ENCOUNTER — Telehealth: Payer: Self-pay | Admitting: Family Medicine

## 2023-02-15 ENCOUNTER — Ambulatory Visit (INDEPENDENT_AMBULATORY_CARE_PROVIDER_SITE_OTHER): Payer: Medicare Other | Admitting: Family Medicine

## 2023-02-15 VITALS — BP 128/80 | HR 86 | Ht 65.0 in | Wt 300.0 lb

## 2023-02-15 DIAGNOSIS — N183 Chronic kidney disease, stage 3 unspecified: Secondary | ICD-10-CM

## 2023-02-15 DIAGNOSIS — I10 Essential (primary) hypertension: Secondary | ICD-10-CM | POA: Diagnosis not present

## 2023-02-15 DIAGNOSIS — R053 Chronic cough: Secondary | ICD-10-CM | POA: Diagnosis not present

## 2023-02-15 DIAGNOSIS — E785 Hyperlipidemia, unspecified: Secondary | ICD-10-CM

## 2023-02-15 DIAGNOSIS — E1122 Type 2 diabetes mellitus with diabetic chronic kidney disease: Secondary | ICD-10-CM | POA: Diagnosis not present

## 2023-02-15 DIAGNOSIS — Z794 Long term (current) use of insulin: Secondary | ICD-10-CM

## 2023-02-15 MED ORDER — MONTELUKAST SODIUM 10 MG PO TABS: 10.0000 mg | ORAL_TABLET | Freq: Every day | ORAL | 5 refills | Status: DC

## 2023-02-15 MED ORDER — REPATHA 140 MG/ML ~~LOC~~ SOSY: 140.0000 mg | PREFILLED_SYRINGE | SUBCUTANEOUS | 5 refills | Status: DC

## 2023-02-15 NOTE — Patient Instructions (Addendum)
F/U in 4 months, call if you need me sooner  No med changes  We will mail lab order for fasting lipid, cmp and EgFr to be drawn 3 to 5 days before visit  .Marland KitchenThanks for choosing [facility], [facility] consider it a privelige to serve you.

## 2023-02-15 NOTE — Telephone Encounter (Signed)
Prescription Request  02/15/2023  LOV: 02/15/2023  What is the name of the medication or equipment? diltiazem (TIADYLT ER) 420 MG 24 hr capsule   Have you contacted your pharmacy to request a refill? Yes   Which pharmacy would you like this sent to?  [facility], [place], [place] [address] Phone: [phone]  Fax: [phone] DEA #: --  Patient notified that their request is being sent to the clinical staff for review and that they should receive a response within 2 business days.   Please advise at Mobile [phone] (mobile)    PATIENT STATES THAT PHARM WILL NOT RELEASE MED BEFORE 4/22 AND PATIENT WILL BE OUT OF TOWN FOR THE NEXT MONTH RETURNING IN MAY. WANTS A CALL BACK.

## 2023-02-16 NOTE — Telephone Encounter (Signed)
Tried contacting patient I spoke with brandon at [facility] they are unable to fill it due to being 19 days early per insurance, he did say if she found a pharmacy where she is traveling to they could transfer the rx to be filled when due.

## 2023-02-18 DIAGNOSIS — G4733 Obstructive sleep apnea (adult) (pediatric): Secondary | ICD-10-CM | POA: Diagnosis not present

## 2023-02-19 ENCOUNTER — Other Ambulatory Visit: Payer: Self-pay | Admitting: Family Medicine

## 2023-02-19 ENCOUNTER — Telehealth: Payer: Self-pay | Admitting: Family Medicine

## 2023-02-19 ENCOUNTER — Other Ambulatory Visit: Payer: Self-pay

## 2023-02-19 ENCOUNTER — Encounter: Payer: Self-pay | Admitting: Family Medicine

## 2023-02-19 MED ORDER — DILTIAZEM HCL ER BEADS 420 MG PO CP24
420.0000 mg | ORAL_CAPSULE | Freq: Every day | ORAL | 0 refills | Status: DC
  Filled 2023-02-19 – 2023-02-20 (×2): qty 90, 90d supply, fill #0

## 2023-02-19 NOTE — Telephone Encounter (Signed)
Pt returning call in regard to previous tele

## 2023-02-20 ENCOUNTER — Other Ambulatory Visit: Payer: Self-pay

## 2023-02-20 NOTE — Telephone Encounter (Signed)
Patient aware.

## 2023-02-20 NOTE — Telephone Encounter (Signed)
Patient aware see other tele msg  

## 2023-02-23 ENCOUNTER — Encounter: Payer: Self-pay | Admitting: Family Medicine

## 2023-02-23 NOTE — Assessment & Plan Note (Signed)
  Patient re-educated about  the importance of commitment to a  minimum of 150 minutes of exercise per week as able.  The importance of healthy food choices with portion control discussed, as well as eating regularly and within a 12 hour window most days. The need to choose "clean , green" food 50 to 75% of the time is discussed, as well as to make water the primary drink and set a goal of 64 ounces water daily.       02/15/2023   11:34 AM 01/25/2023    1:41 PM 12/29/2022    9:51 AM  Weight /BMI  Weight 300 lb 0.6 oz 301 lb 304 lb  Height 5\' 5"  (1.651 m) 5\' 5"  (1.651 m) 5\' 5"  (1.651 m)  BMI 49.93 kg/m2 50.09 kg/m2 50.59 kg/m2    Unchanged

## 2023-02-23 NOTE — Assessment & Plan Note (Signed)
Tessalon perle prescribed as reports they are beneficial

## 2023-02-23 NOTE — Assessment & Plan Note (Signed)
Hyperlipidemia:Low fat diet discussed and encouraged.   Lipid Panel  Lab Results  Component Value Date   CHOL 218 (H) 02/13/2023   HDL 57 02/13/2023   LDLCALC 144 (H) 02/13/2023   TRIG 94 02/13/2023   CHOLHDL 3.8 02/13/2023     Not at goal uncontroled, pt reports med intolerance, f/u with cardiology for lipid management

## 2023-02-23 NOTE — Progress Notes (Signed)
Kendra Hanson     MRN: [id number]      DOB: [birth year]   HPI [facility] is here for follow up and re-evaluation of chronic medical conditions, medication management and review of any available recent lab and radiology data.  Preventive health is updated, specifically  Cancer screening and Immunization.   Questions or concerns regarding consultations or procedures which the PT has had in the interim are  addressed. The PT reports joint pain with repatha Hd steroid injections in knees yesterday , will be travelling and out of town for the next several weeks and the steroid injections provide significant pain relief though blood sugar increases  still calls for them from time to time ROS Denies recent fever or chills. Denies sinus pressure, nasal congestion, ear pain or sore throat. Denies chest congestion, productive cough or wheezing. Denies chest pains, palpitations and leg swelling Denies abdominal pain, nausea, vomiting,diarrhea or constipation.   Denies dysuria, frequency, hesitancy or incontinence. Denies headaches, seizures, numbness, or tingling. Denies depression, anxiety or insomnia. Denies skin break down or rash.   PE  BP 128/80   Pulse 86   Ht 5\' 5"  (1.651 m)   Wt (!) 300 lb 0.6 oz (136.1 kg)   SpO2 94%   BMI 49.93 kg/m   Patient alert and oriented and in no cardiopulmonary distress.  HEENT: No facial asymmetry, EOMI,     Neck supple .  Chest: Clear to auscultation bilaterally.  CVS: S1, S2 no murmurs, no S3.Regular rate.  ABD: Soft non tender.   Ext: No edema  MS: decreased  ROM spine, shoulders, hips and knees.  Skin: Intact, no ulcerations or rash noted.  Psych: Good eye contact, normal affect. Memory intact not anxious or depressed appearing.  CNS: CN 2-12 intact, power,  normal throughout.no focal deficits noted.   Assessment & Plan  Primary hypertension Controlled, no change in medication DASH diet and commitment to daily physical  activity for a minimum of 30 minutes discussed and encouraged, as a part of hypertension management. The importance of attaining a healthy weight is also discussed.     02/15/2023   12:05 PM 02/15/2023   11:39 AM 02/15/2023   11:34 AM 01/25/2023    1:41 PM 12/29/2022   10:36 AM 12/29/2022    9:57 AM 12/29/2022    9:51 AM  BP/Weight  Systolic BP 128 138 143 108 138 142 145  Diastolic BP 80 75 70 74 70 80 77  Wt. (Lbs)   300.04 301   304  BMI   49.93 kg/m2 50.09 kg/m2   50.59 kg/m2    '   Type 2 diabetes mellitus (HCC) Controlled, no change in medication Kendra Hanson is reminded of the importance of commitment to daily physical activity for 30 minutes or more, as able and the need to limit carbohydrate intake to 30 to 60 grams per meal to help with blood sugar control.   The need to take medication as prescribed, test blood sugar as directed, and to call between visits if there is a concern that blood sugar is uncontrolled is also discussed.   Kendra Hanson is reminded of the importance of daily foot exam, annual eye examination, and good blood sugar, blood pressure and cholesterol control.     Latest Ref Rng & Units 02/13/2023    9:09 AM 01/25/2023    1:57 PM 11/14/2022   11:52 AM 09/26/2022    3:56 PM 07/19/2022    1:32 PM  Diabetic  Labs  HbA1c 4.0 - 5.6 %  6.5   6.5    Chol 100 - 199 mg/dL 191   478   295   HDL >62 mg/dL 57   57   65   Calc LDL 0 - 99 mg/dL 130   865   784   Triglycerides 0 - 149 mg/dL 94   696   295   Creatinine 0.57 - 1.00 mg/dL 2.84   1.32         02/14/101   12:05 PM 02/15/2023   11:39 AM 02/15/2023   11:34 AM 01/25/2023    1:41 PM 12/29/2022   10:36 AM 12/29/2022    9:57 AM 12/29/2022    9:51 AM  BP/Weight  Systolic BP 128 138 143 108 138 142 145  Diastolic BP 80 75 70 74 70 80 77  Wt. (Lbs)   300.04 301   304  BMI   49.93 kg/m2 50.09 kg/m2   50.59 kg/m2      Latest Ref Rng & Units 09/26/2022    3:30 PM 06/08/2022   12:00 AM  Foot/eye exam completion dates   Eye Exam No Retinopathy  No Retinopathy      Foot Form Completion  Done      This result is from an external source.      Managed by Endo  Chronic cough Tessalon perle prescribed as reports they are beneficial  Hyperlipidemia LDL goal <100 Hyperlipidemia:Low fat diet discussed and encouraged.   Lipid Panel  Lab Results  Component Value Date   CHOL 218 (H) 02/13/2023   HDL 57 02/13/2023   LDLCALC 144 (H) 02/13/2023   TRIG 94 02/13/2023   CHOLHDL 3.8 02/13/2023     Not at goal uncontroled, pt reports med intolerance, f/u with cardiology for lipid management  Morbid obesity [facility])  Patient re-educated about  the importance of commitment to a  minimum of 150 minutes of exercise per week as able.  The importance of healthy food choices with portion control discussed, as well as eating regularly and within a 12 hour window most days. The need to choose "clean , green" food 50 to 75% of the time is discussed, as well as to make water the primary drink and set a goal of 64 ounces water daily.       02/15/2023   11:34 AM 01/25/2023    1:41 PM 12/29/2022    9:51 AM  Weight /BMI  Weight 300 lb 0.6 oz 301 lb 304 lb  Height  (1.651 m)  (1.651 m)  (1.651 m)  BMI 49.93 kg/m2 50.09 kg/m2 50.59 kg/m2    Unchanged

## 2023-02-23 NOTE — Assessment & Plan Note (Signed)
Controlled, no change in medication DASH diet and commitment to daily physical activity for a minimum of 30 minutes discussed and encouraged, as a part of hypertension management. The importance of attaining a healthy weight is also discussed.     02/15/2023   12:05 PM 02/15/2023   11:39 AM 02/15/2023   11:34 AM 01/25/2023    1:41 PM 12/29/2022   10:36 AM 12/29/2022    9:57 AM 12/29/2022    9:51 AM  BP/Weight  Systolic BP 128 138 143 108 138 142 145  Diastolic BP 80 75 70 74 70 80 77  Wt. (Lbs)   300.04 301   304  BMI   49.93 kg/m2 50.09 kg/m2   50.59 kg/m2    '

## 2023-02-23 NOTE — Assessment & Plan Note (Signed)
Controlled, no change in medication Kendra Hanson is reminded of the importance of commitment to daily physical activity for 30 minutes or more, as able and the need to limit carbohydrate intake to 30 to 60 grams per meal to help with blood sugar control.   The need to take medication as prescribed, test blood sugar as directed, and to call between visits if there is a concern that blood sugar is uncontrolled is also discussed.   Kendra Hanson is reminded of the importance of daily foot exam, annual eye examination, and good blood sugar, blood pressure and cholesterol control.     Latest Ref Rng & Units 02/13/2023    9:09 AM 01/25/2023    1:57 PM 11/14/2022   11:52 AM 09/26/2022    3:56 PM 07/19/2022    1:32 PM  Diabetic Labs  HbA1c 4.0 - 5.6 %  6.5   6.5    Chol 100 - 199 mg/dL 889   169   450   HDL >38 mg/dL 57   57   65   Calc LDL 0 - 99 mg/dL 882   800   349   Triglycerides 0 - 149 mg/dL 94   179   150   Creatinine 0.57 - 1.00 mg/dL 5.69   7.94         8/0/1655   12:05 PM 02/15/2023   11:39 AM 02/15/2023   11:34 AM 01/25/2023    1:41 PM 12/29/2022   10:36 AM 12/29/2022    9:57 AM 12/29/2022    9:51 AM  BP/Weight  Systolic BP 128 138 143 108 138 142 145  Diastolic BP 80 75 70 74 70 80 77  Wt. (Lbs)   300.04 301   304  BMI   49.93 kg/m2 50.09 kg/m2   50.59 kg/m2      Latest Ref Rng & Units 09/26/2022    3:30 PM 06/08/2022   12:00 AM  Foot/eye exam completion dates  Eye Exam No Retinopathy  No Retinopathy      Foot Form Completion  Done      This result is from an external source.      Managed by Endo

## 2023-02-27 DIAGNOSIS — E1165 Type 2 diabetes mellitus with hyperglycemia: Secondary | ICD-10-CM | POA: Diagnosis not present

## 2023-02-28 ENCOUNTER — Telehealth: Payer: Self-pay | Admitting: Family Medicine

## 2023-02-28 ENCOUNTER — Other Ambulatory Visit: Payer: Self-pay

## 2023-02-28 ENCOUNTER — Other Ambulatory Visit: Payer: Self-pay | Admitting: Family Medicine

## 2023-02-28 DIAGNOSIS — I150 Renovascular hypertension: Secondary | ICD-10-CM

## 2023-02-28 MED ORDER — DILTIAZEM HCL ER BEADS 420 MG PO CP24: 420.0000 mg | ORAL_CAPSULE | Freq: Every day | ORAL | 0 refills | Status: DC

## 2023-02-28 NOTE — Telephone Encounter (Signed)
diltiazem (TIADYLT ER) 420 MG 24 hr capsule  carvedilol (COREG) 6.25 MG tablet [[id number]]  pt called and would like these med's sent to [facility] pharmacy [address] [place] [address] she is out of town and ran out.

## 2023-02-28 NOTE — Telephone Encounter (Signed)
Med sent to requested pharmacy 

## 2023-03-19 DIAGNOSIS — G4733 Obstructive sleep apnea (adult) (pediatric): Secondary | ICD-10-CM | POA: Diagnosis not present

## 2023-03-20 ENCOUNTER — Other Ambulatory Visit: Payer: Self-pay | Admitting: Family Medicine

## 2023-03-20 DIAGNOSIS — E876 Hypokalemia: Secondary | ICD-10-CM

## 2023-03-20 DIAGNOSIS — G4733 Obstructive sleep apnea (adult) (pediatric): Secondary | ICD-10-CM | POA: Diagnosis not present

## 2023-03-26 ENCOUNTER — Encounter: Payer: Self-pay | Admitting: Dietician

## 2023-03-26 ENCOUNTER — Encounter: Payer: Medicare Other | Attending: Nurse Practitioner | Admitting: Dietician

## 2023-03-26 ENCOUNTER — Ambulatory Visit: Payer: Medicare Other | Admitting: Internal Medicine

## 2023-03-26 DIAGNOSIS — N183 Chronic kidney disease, stage 3 unspecified: Secondary | ICD-10-CM

## 2023-03-26 DIAGNOSIS — E1122 Type 2 diabetes mellitus with diabetic chronic kidney disease: Secondary | ICD-10-CM | POA: Diagnosis not present

## 2023-03-26 DIAGNOSIS — Z794 Long term (current) use of insulin: Secondary | ICD-10-CM | POA: Insufficient documentation

## 2023-03-26 NOTE — Progress Notes (Signed)
Medical Nutrition Therapy  Appointment Start time:  1407  Appointment End time:  1443 Phone visit.  Patient is in her car and I am at my office.  She was last seen by myself 12/25/2022  Today:  Good energy Increased walking when in [place] and currently less exercise. Using some salt and eats out once daily.  Primary concerns today: She would like to learn more about nutrition that is good for diabetes and CKD Referral diagnosis: Type 2 diabetes and stage 3b CKD  Preferred learning style: no preference indicated Learning readiness: ready  NUTRITION ASSESSMENT  Dexcom falling off due to her body wash - explained that she should clean skin with alcohol prior to applying.  She is currently not using the Dexcom. Kidney labs improved A1C decreased and cholesterol improved Lymphedema improved considerably since she is using wraps recommended by MD Mobility improved but pain continues She has decreased her sodium intake and is eating smaller portions.  She is filling up quickly. She is taking an Alive vitamin and has stopped the vitamin D per MD advise as her MVI contains vitamin D.  Anthropometrics  65" 301 lbs 01/25/2023 306 lbs 12/24/2022 317 lbs 09/26/2022 328 lbs 08/22/2022 skipped her fluid pill yesterday 318 lbs 08/09/2022  UBW  Clinical Medical Hx: Type 2 Diabetes, CKD stage 2b, lymphedema, OSA c-pap (using this more (2/24), GERD, HTN, HLD, anemia, vitamin D deficiency Medications/Supplements: 20 units q am and  HS Humulin 70/30 , Ozempic, torsemide, potassium, calcitriol, MVI, Repatha Labs: A1C 6.5% 01/25/2023 stable 6.5% 09/26/2022, 7.5% 07/19/2022, Hgb 11.7 BUN 23, Creatinine 1.22, Potassium 4.6, BUN 50 02/13/2023 on 02/13/23, Iron 39, TIBC 273, Iron Saturation 14 (12/11/2022)  Lipid Panel     Component Value Date/Time   CHOL 218 (H) 02/13/2023 0909   TRIG 94 02/13/2023 0909   HDL 57 02/13/2023 0909   CHOLHDL 3.8 02/13/2023 0909   CHOLHDL 4.1 09/02/2019 0846   VLDL 15 04/12/2017  1121   LDLCALC 144 (H) 02/13/2023 0909   LDLCALC 131 (H) 09/02/2019 0846   LABVLDL 17 02/13/2023 0909   CGM:  Dexcom Notable Signs/Symptoms: swelling feet, fluid weight gain  Lifestyle & Dietary Hx Patient lives with her husband.  They share shopping and cooking.  She uses an Engineer, petroleum at the store.  Eats out 3-4 times per week (asks for a to-go box at the beginning of the meal) She is on disability.  She used to work as an Loss adjuster, chartered for 40 years. Avoids pork, uses I can't believe it's not butter, raw vegetables make her nauseas Has dentures and unable to tolerate hard food  Estimated daily fluid intake: 64 oz fluid restriction Sleep: 4-6 hours "better with c-pap" Stress / self-care: none Current average weekly physical activity: lymphedema and knee problems prohibit  24-Hr Dietary Recall First Meal:  1 boiled egg and 4-6 oz juice Second Meal: 2 hot dogs without buns Snack:  Third Meal:   [place] burger on bun with tomato, pickle, mustard Beverages: water, juice, regular gingerale 16-24 oz  Estimated Energy Needs Protein: 85-95 g  NUTRITION DIAGNOSIS  NB-1.1 Food and nutrition-related knowledge deficit As related to balance of carbohydrates, protein, and fat along with guidelines for CKD.  As evidenced by diet hx and patient report.  NUTRITION INTERVENTION  Nutrition education (E-1) on the following topics:  Reducing sodium and sugar Behavioral changes Weight and A1C changes review  Handouts Provided Include - previous visit NKD national kidney diet - Dish up a Kidney-Friendly Meal  for Patients with Chronic Kidney Disease (not on dialysis) Label reading [employer] of Lifestyle Medicine packet.  Learning Style & Readiness for Change Teaching method utilized: Visual & Auditory  Demonstrated degree of understanding via: Teach Back  Barriers to learning/adherence to lifestyle change: health issues  Goals Small meals throughout the day -Breakfast, lunch,  dinner Avoid skipping meals Decreased processed meat, Avoid added salt Avoid/limit juice, regular soda, and other carbohydrate containing beverages  Avoid seasonings with potassium in the ingredient list. Avoid foods with Phos... in the ingredient list.  Blood glucose goals:  80-130 fasting   100-180 two hours after starting any meal  Generally a rise of 40-60 points after a meal is normal  Low sodium options: [employer] palate low sodium pasta sauce    MONITORING & EVALUATION Dietary intake, weekly physical activity, and label reading in 2 months.  Next Steps  Patient is to call for questions.

## 2023-03-26 NOTE — Patient Instructions (Signed)
Decreased processed meat, Avoid added salt Avoid/limit juice, regular soda, and other carbohydrate containing beverages

## 2023-03-29 DIAGNOSIS — E1165 Type 2 diabetes mellitus with hyperglycemia: Secondary | ICD-10-CM | POA: Diagnosis not present

## 2023-03-30 ENCOUNTER — Other Ambulatory Visit: Payer: Self-pay | Admitting: Family Medicine

## 2023-03-30 DIAGNOSIS — I150 Renovascular hypertension: Secondary | ICD-10-CM

## 2023-04-03 DIAGNOSIS — E1122 Type 2 diabetes mellitus with diabetic chronic kidney disease: Secondary | ICD-10-CM | POA: Diagnosis not present

## 2023-04-03 DIAGNOSIS — N189 Chronic kidney disease, unspecified: Secondary | ICD-10-CM | POA: Diagnosis not present

## 2023-04-03 DIAGNOSIS — R809 Proteinuria, unspecified: Secondary | ICD-10-CM | POA: Diagnosis not present

## 2023-04-03 DIAGNOSIS — D631 Anemia in chronic kidney disease: Secondary | ICD-10-CM | POA: Diagnosis not present

## 2023-04-03 DIAGNOSIS — N184 Chronic kidney disease, stage 4 (severe): Secondary | ICD-10-CM | POA: Diagnosis not present

## 2023-04-12 ENCOUNTER — Telehealth: Payer: Self-pay | Admitting: Internal Medicine

## 2023-04-12 NOTE — Telephone Encounter (Signed)
I spoke with patient.She needs an EKG and agrees to come into office.

## 2023-04-12 NOTE — Telephone Encounter (Signed)
Patient wants to have virtual visit on 6/4 at 11:40 am.

## 2023-04-13 DIAGNOSIS — R809 Proteinuria, unspecified: Secondary | ICD-10-CM | POA: Diagnosis not present

## 2023-04-13 DIAGNOSIS — E1122 Type 2 diabetes mellitus with diabetic chronic kidney disease: Secondary | ICD-10-CM | POA: Diagnosis not present

## 2023-04-13 DIAGNOSIS — E1129 Type 2 diabetes mellitus with other diabetic kidney complication: Secondary | ICD-10-CM | POA: Diagnosis not present

## 2023-04-17 ENCOUNTER — Ambulatory Visit: Payer: Medicare Other | Admitting: Internal Medicine

## 2023-04-17 ENCOUNTER — Ambulatory Visit: Payer: Medicare Other | Attending: Internal Medicine | Admitting: Internal Medicine

## 2023-04-17 ENCOUNTER — Encounter: Payer: Self-pay | Admitting: Internal Medicine

## 2023-04-17 VITALS — BP 132/80 | HR 65 | Ht 65.5 in | Wt 303.4 lb

## 2023-04-17 DIAGNOSIS — Z789 Other specified health status: Secondary | ICD-10-CM

## 2023-04-17 DIAGNOSIS — I5032 Chronic diastolic (congestive) heart failure: Secondary | ICD-10-CM

## 2023-04-17 DIAGNOSIS — H0012 Chalazion right lower eyelid: Secondary | ICD-10-CM | POA: Diagnosis not present

## 2023-04-17 MED ORDER — PRALUENT 150 MG/ML ~~LOC~~ SOAJ: 150.0000 mg | SUBCUTANEOUS | 2 refills | Status: DC

## 2023-04-17 NOTE — Patient Instructions (Signed)
Medication Instructions:  Your physician has recommended you make the following change in your medication:   -Stop Repatha  -Start Praluent injections once every 2 weeks.    *If you need a refill on your cardiac medications before your next appointment, please call your pharmacy*   Lab Work: None If you have labs (blood work) drawn today and your tests are completely normal, you will receive your results only by: MyChart Message (if you have MyChart) OR A paper copy in the mail If you have any lab test that is abnormal or we need to change your treatment, we will call you to review the results.   Testing/Procedures: None   Follow-Up: At [facility], you and your health needs are our priority.  As part of our continuing mission to provide you with exceptional heart care, we have created designated Provider Care Teams.  These Care Teams include your primary Cardiologist (physician) and Advanced Practice Providers (APPs -  Physician Assistants and Nurse Practitioners) who all work together to provide you with the care you need, when you need it.  We recommend signing up for the patient portal called "MyChart".  Sign up information is provided on this After Visit Summary.  MyChart is used to connect with patients for Virtual Visits (Telemedicine).  Patients are able to view lab/test results, encounter notes, upcoming appointments, etc.  Non-urgent messages can be sent to your provider as well.   To learn more about what you can do with MyChart, go to [email or web address].    Your next appointment:   1 year(s)  Provider:   Luane School, MD    Other Instructions

## 2023-04-20 ENCOUNTER — Encounter: Payer: Self-pay | Admitting: Internal Medicine

## 2023-04-23 DIAGNOSIS — I5030 Unspecified diastolic (congestive) heart failure: Secondary | ICD-10-CM | POA: Insufficient documentation

## 2023-04-23 DIAGNOSIS — Z789 Other specified health status: Secondary | ICD-10-CM | POA: Insufficient documentation
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
## 2023-04-27 ENCOUNTER — Other Ambulatory Visit: Payer: Self-pay | Admitting: Family Medicine

## 2023-04-27 ENCOUNTER — Telehealth: Payer: Self-pay

## 2023-04-27 ENCOUNTER — Other Ambulatory Visit (HOSPITAL_COMMUNITY): Payer: Self-pay

## 2023-04-27 DIAGNOSIS — I150 Renovascular hypertension: Secondary | ICD-10-CM

## 2023-04-27 NOTE — Telephone Encounter (Signed)
Pharmacy Patient Advocate Encounter   Received notification from [facility] MEDICARE that prior authorization for PRALUENT is needed.    PA submitted on 04/27/23 Key [id number] Status is pending  Haze Rushing, CPhT Pharmacy Patient Advocate Specialist Direct Number: [phone] Fax: [phone]

## 2023-04-29 DIAGNOSIS — E1165 Type 2 diabetes mellitus with hyperglycemia: Secondary | ICD-10-CM | POA: Diagnosis not present

## 2023-04-30 NOTE — Telephone Encounter (Signed)
Pharmacy Patient Advocate Encounter  Prior Authorization for PRALUENT has been approved.    PA# [id number] Effective dates: 04/27/23 through 10/27/23  Haze Rushing, CPhT Pharmacy Patient Advocate Specialist Direct Number: [phone] Fax: [phone]

## 2023-05-08 ENCOUNTER — Other Ambulatory Visit: Payer: Self-pay | Admitting: Family Medicine

## 2023-05-08 DIAGNOSIS — E876 Hypokalemia: Secondary | ICD-10-CM

## 2023-05-11 ENCOUNTER — Telehealth: Payer: Self-pay | Admitting: Nurse Practitioner

## 2023-05-11 NOTE — Telephone Encounter (Signed)
Called pt to let her know that her pt assistance ozempic/pen needles are here

## 2023-05-11 NOTE — Telephone Encounter (Signed)
Pt picked up patient assistance of Ozempic and pen needles.

## 2023-05-16 ENCOUNTER — Telehealth: Payer: Self-pay | Admitting: Nurse Practitioner

## 2023-05-16 NOTE — Telephone Encounter (Signed)
Pts husband picked up patient assistance of Novolog 70/30.

## 2023-05-16 NOTE — Telephone Encounter (Signed)
Called and let pt know pt assistance of Novolog came.

## 2023-05-25 ENCOUNTER — Encounter: Payer: Self-pay | Admitting: Family Medicine

## 2023-05-29 ENCOUNTER — Ambulatory Visit: Payer: Medicare Other | Admitting: Nurse Practitioner

## 2023-05-29 ENCOUNTER — Encounter: Payer: Self-pay | Admitting: Nurse Practitioner

## 2023-05-29 VITALS — BP 128/67 | HR 91 | Ht 65.5 in | Wt 301.0 lb

## 2023-05-29 DIAGNOSIS — Z794 Long term (current) use of insulin: Secondary | ICD-10-CM

## 2023-05-29 DIAGNOSIS — E1122 Type 2 diabetes mellitus with diabetic chronic kidney disease: Secondary | ICD-10-CM | POA: Diagnosis not present

## 2023-05-29 DIAGNOSIS — E559 Vitamin D deficiency, unspecified: Secondary | ICD-10-CM | POA: Diagnosis not present

## 2023-05-29 DIAGNOSIS — E782 Mixed hyperlipidemia: Secondary | ICD-10-CM

## 2023-05-29 DIAGNOSIS — I10 Essential (primary) hypertension: Secondary | ICD-10-CM

## 2023-05-29 DIAGNOSIS — Z7985 Long-term (current) use of injectable non-insulin antidiabetic drugs: Secondary | ICD-10-CM | POA: Diagnosis not present

## 2023-05-29 DIAGNOSIS — N1832 Chronic kidney disease, stage 3b: Secondary | ICD-10-CM | POA: Diagnosis not present

## 2023-05-29 DIAGNOSIS — E1165 Type 2 diabetes mellitus with hyperglycemia: Secondary | ICD-10-CM | POA: Diagnosis not present

## 2023-05-29 LAB — POCT GLYCOSYLATED HEMOGLOBIN (HGB A1C): Hemoglobin A1C: 7.8 % — AB (ref 4.0–5.6)
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
## 2023-06-07 DIAGNOSIS — M170 Bilateral primary osteoarthritis of knee: Secondary | ICD-10-CM | POA: Diagnosis not present

## 2023-06-08 ENCOUNTER — Encounter: Payer: Self-pay | Admitting: Nurse Practitioner

## 2023-06-14 ENCOUNTER — Encounter: Payer: Self-pay | Admitting: Family Medicine

## 2023-06-14 ENCOUNTER — Telehealth: Payer: Self-pay | Admitting: Family Medicine

## 2023-06-14 NOTE — Telephone Encounter (Signed)
Patient called does patient need to redo her bloodwork 2 different orders she did one of one day and another order for next day?  call patient to let her know.

## 2023-06-14 NOTE — Telephone Encounter (Signed)
Sent mychart message

## 2023-06-15 DIAGNOSIS — E1122 Type 2 diabetes mellitus with diabetic chronic kidney disease: Secondary | ICD-10-CM | POA: Diagnosis not present

## 2023-06-15 DIAGNOSIS — E785 Hyperlipidemia, unspecified: Secondary | ICD-10-CM | POA: Diagnosis not present

## 2023-06-15 DIAGNOSIS — Z794 Long term (current) use of insulin: Secondary | ICD-10-CM | POA: Diagnosis not present

## 2023-06-15 DIAGNOSIS — N183 Chronic kidney disease, stage 3 unspecified: Secondary | ICD-10-CM | POA: Diagnosis not present

## 2023-06-18 ENCOUNTER — Ambulatory Visit: Payer: Medicare Other | Admitting: Podiatry

## 2023-06-18 ENCOUNTER — Other Ambulatory Visit: Payer: Self-pay | Admitting: Family Medicine

## 2023-06-18 ENCOUNTER — Encounter: Payer: Self-pay | Admitting: Podiatry

## 2023-06-18 ENCOUNTER — Ambulatory Visit (INDEPENDENT_AMBULATORY_CARE_PROVIDER_SITE_OTHER): Payer: Medicare Other

## 2023-06-18 DIAGNOSIS — S90852A Superficial foreign body, left foot, initial encounter: Secondary | ICD-10-CM

## 2023-06-18 DIAGNOSIS — E1140 Type 2 diabetes mellitus with diabetic neuropathy, unspecified: Secondary | ICD-10-CM

## 2023-06-18 DIAGNOSIS — E1149 Type 2 diabetes mellitus with other diabetic neurological complication: Secondary | ICD-10-CM

## 2023-06-18 DIAGNOSIS — I150 Renovascular hypertension: Secondary | ICD-10-CM

## 2023-06-18 NOTE — Progress Notes (Signed)
Subjective:   [name], female   DOB: 62 y.o.   MRN: [id number]   HPI Patient thinks she stepped on a piece of glass left and she feels like it still in there   ROS      Objective:  Physical Exam  Diabetic also has significant obesity is symptomatic problem with a distal lesion on the left forefoot that does appear to have foreign body material within it with no proximal edema erythema drainage noted F2 probability for foreign body glass left fourth metatarsal      Assessment:  Probability for glass in the left forefoot     Plan:  H&P reviewed sterile sharp debridement after anesthetizing the area I was able to get out several shards of glass I flushed the area I applied sterile dressing instructed on bandage usage soaks should heal uneventfully come in if any issues were to occur  X-rays were negative for this being any kind of the metallic issue

## 2023-06-20 ENCOUNTER — Encounter: Payer: Self-pay | Admitting: Family Medicine

## 2023-06-20 ENCOUNTER — Ambulatory Visit (INDEPENDENT_AMBULATORY_CARE_PROVIDER_SITE_OTHER): Payer: Medicare Other | Admitting: Family Medicine

## 2023-06-20 VITALS — BP 134/73 | HR 82 | Ht 65.0 in | Wt 296.0 lb

## 2023-06-20 DIAGNOSIS — N183 Chronic kidney disease, stage 3 unspecified: Secondary | ICD-10-CM | POA: Diagnosis not present

## 2023-06-20 DIAGNOSIS — I10 Essential (primary) hypertension: Secondary | ICD-10-CM | POA: Diagnosis not present

## 2023-06-20 DIAGNOSIS — E785 Hyperlipidemia, unspecified: Secondary | ICD-10-CM

## 2023-06-20 DIAGNOSIS — I150 Renovascular hypertension: Secondary | ICD-10-CM | POA: Diagnosis not present

## 2023-06-20 DIAGNOSIS — S91319A Laceration without foreign body, unspecified foot, initial encounter: Secondary | ICD-10-CM

## 2023-06-20 DIAGNOSIS — Z794 Long term (current) use of insulin: Secondary | ICD-10-CM

## 2023-06-20 DIAGNOSIS — S91312D Laceration without foreign body, left foot, subsequent encounter: Secondary | ICD-10-CM

## 2023-06-20 DIAGNOSIS — E1122 Type 2 diabetes mellitus with diabetic chronic kidney disease: Secondary | ICD-10-CM

## 2023-06-20 MED ORDER — CEPHALEXIN 500 MG PO CAPS: 500.0000 mg | ORAL_CAPSULE | Freq: Two times a day (BID) | ORAL | 0 refills | Status: DC

## 2023-06-20 NOTE — Patient Instructions (Signed)
F/U early Novemebr, call if you need me sooner  Keflex is prescribed for 1 week  Wound looks good not infected   Need TdAP , please getat your pharmacy  Thanks for choosing [facility], we consider it a privelige to serve you.  Fasting lipid, cmp and EGFR 3 to 5 days before next appt

## 2023-06-21 ENCOUNTER — Other Ambulatory Visit: Payer: Self-pay | Admitting: Family Medicine

## 2023-06-21 DIAGNOSIS — E876 Hypokalemia: Secondary | ICD-10-CM

## 2023-06-25 ENCOUNTER — Encounter: Payer: Self-pay | Admitting: Dietician

## 2023-06-25 ENCOUNTER — Encounter: Payer: Medicare Other | Attending: Nurse Practitioner | Admitting: Dietician

## 2023-06-25 DIAGNOSIS — N183 Chronic kidney disease, stage 3 unspecified: Secondary | ICD-10-CM | POA: Insufficient documentation

## 2023-06-25 DIAGNOSIS — Z794 Long term (current) use of insulin: Secondary | ICD-10-CM | POA: Insufficient documentation

## 2023-06-25 DIAGNOSIS — E1122 Type 2 diabetes mellitus with diabetic chronic kidney disease: Secondary | ICD-10-CM | POA: Insufficient documentation

## 2023-06-25 DIAGNOSIS — N189 Chronic kidney disease, unspecified: Secondary | ICD-10-CM | POA: Insufficient documentation

## 2023-06-25 NOTE — Progress Notes (Signed)
Medical Nutrition Therapy  Appointment Start time:  [phone]  Appointment End time:  1418 Phone visit.  Patient is in her car and I am at my office.  She was last seen by myself 03/26/2023  Today:  Good energy Not exercising much due to lymphedema. Recently got glass in her foot. She is doing armchair exercises 5-10 minutes about 4 days per week. Using little added salt. Cholesterol remains elevated and started a new medication. Likes Dexcom as it keeps her on track. Sleeping more but is not wearing C-pap consistently since vacation.  Primary concerns today: She would like to learn more about nutrition that is good for diabetes and CKD Referral diagnosis: Type 2 diabetes and stage 3b CKD  Preferred learning style: no preference indicated Learning readiness: ready  NUTRITION ASSESSMENT  Dexcom falling off due to her body wash - explained that she should clean skin with alcohol prior to applying.  She is currently not using the Dexcom. Kidney labs improved A1C decreased and cholesterol improved Lymphedema improved considerably since she is using wraps recommended by MD Mobility improved but pain continues She has decreased her sodium intake and is eating smaller portions.  She is filling up quickly. She is taking an Alive vitamin and has stopped the vitamin D per MD advise as her MVI contains vitamin D.  Anthropometrics  65" 301 lbs 01/25/2023 306 lbs 12/24/2022 317 lbs 09/26/2022 328 lbs 08/22/2022 skipped her fluid pill yesterday 318 lbs 08/09/2022  UBW  Clinical Medical Hx: Type 2 Diabetes, CKD stage 2b, lymphedema, OSA c-pap (using this more 2/24), GERD, HTN, HLD, anemia, vitamin D deficiency Medications/Supplements: 20 units q am and  HS Humulin 70/30 , Ozempic, torsemide, potassium, calcitriol, MVI, Repatha Labs: A1C 6.5% 01/25/2023 stable 6.5% 09/26/2022, 7.5% 07/19/2022, Hgb 11.7 BUN 23, Creatinine 1.22, Potassium 4.6, BUN 50 02/13/2023 on 02/13/23, Iron 39, TIBC 273, Iron Saturation  14 (12/11/2022)  Lipid Panel     Component Value Date/Time   CHOL 236 (H) 06/15/2023 1104   TRIG 86 06/15/2023 1104   HDL 76 06/15/2023 1104   CHOLHDL 3.1 06/15/2023 1104   CHOLHDL 4.1 09/02/2019 0846   VLDL 15 04/12/2017 1121   LDLCALC 145 (H) 06/15/2023 1104   LDLCALC 131 (H) 09/02/2019 0846   LABVLDL 15 06/15/2023 1104   CGM:  Dexcom Notable Signs/Symptoms: swelling feet, fluid weight gain   CGM Results from download:   % Time CGM active:    %   (Goal >70%)  Average glucose:    mg/dL for  days  Glucose management indicator:    %  Time in range (70-180 mg/dL):   73 %   (Goal >95%)  Time High (181-250 mg/dL):   25 %   (Goal < 63%)  Time Very High (>250 mg/dL):    1 %   (Goal < 5%)  Time Low (54-69 mg/dL):   0 %   (Goal <8%)  Time Very Low (<54 mg/dL):   1 %   (Goal <7%)  %CV (glucose variability)     %  (Goal <36%)    Lifestyle & Dietary Hx Patient lives with her husband.  They share shopping and cooking.  She uses an Engineer, petroleum at the store.  Eats out 3-4 times per week (asks for a to-go box at the beginning of the meal) She is on disability.  She used to work as an Loss adjuster, chartered for 40 years. Avoids pork, uses I can't believe it's not butter, raw vegetables make her  nauseas Has dentures and unable to tolerate hard food  Estimated daily fluid intake: 64 oz fluid restriction Sleep: 6-7 hours hours "better with c-pap" Stress / self-care: none Current average weekly physical activity: lymphedema and knee problems prohibit  24-Hr Dietary Recall First Meal:  2 boiled egg  Snack: watermelon Second Meal:  hamburger patty, baked potato with butter and sour cream, green beans Third Meal:  fried chicken, corn, green beans Snack:  watermelon Beverages:twist, water, occasional lemonade  Estimated Energy Needs Protein: 85-95 g  NUTRITION DIAGNOSIS  NB-1.1 Food and nutrition-related knowledge deficit As related to balance of carbohydrates, protein, and fat along with  guidelines for CKD.  As evidenced by diet hx and patient report.  NUTRITION INTERVENTION  Nutrition education (E-1) on the following topics:  continued Reducing sodium and sugar Behavioral changes Weight and A1C changes review CGM reviewed as well as causes of low blood glucose  Handouts Provided Include - previous visit NKD national kidney diet - Dish up a Kidney-Friendly Meal for Patients with Chronic Kidney Disease (not on dialysis) Label reading [employer] of Lifestyle Medicine packet.  Learning Style & Readiness for Change Teaching method utilized: Visual & Auditory  Demonstrated degree of understanding via: Teach Back  Barriers to learning/adherence to lifestyle change: health issues  Goals Small meals throughout the day -Breakfast, lunch, dinner Avoid skipping meals Decreased processed meat, Avoid added salt Avoid/limit juice, regular soda, and other carbohydrate containing beverages  Avoid seasonings with potassium in the ingredient list. Avoid foods with Phos... in the ingredient list.  Blood glucose goals:  80-130 fasting   100-180 two hours after starting any meal  Generally a rise of 40-60 points after a meal is normal  Low sodium options: [employer] palate low sodium pasta sauce    MONITORING & EVALUATION Dietary intake, weekly physical activity, and label reading in 4 months.  Next Steps  Patient is to call for questions.

## 2023-06-26 ENCOUNTER — Encounter: Payer: Self-pay | Admitting: Family Medicine

## 2023-06-26 DIAGNOSIS — S91319A Laceration without foreign body, unspecified foot, initial encounter: Secondary | ICD-10-CM | POA: Insufficient documentation

## 2023-06-26 NOTE — Assessment & Plan Note (Signed)
Kendra Hanson is reminded of the importance of commitment to daily physical activity for 30 minutes or more, as able and the need to limit carbohydrate intake to 30 to 60 grams per meal to help with blood sugar control.   The need to take medication as prescribed, test blood sugar as directed, and to call between visits if there is a concern that blood sugar is uncontrolled is also discussed.   Kendra Hanson is reminded of the importance of daily foot exam, annual eye examination, and good blood sugar, blood pressure and cholesterol control.     Latest Ref Rng & Units 06/15/2023   11:04 AM 05/29/2023    1:49 PM 02/13/2023    9:09 AM 01/25/2023    1:57 PM 11/14/2022   11:52 AM  Diabetic Labs  HbA1c 4.0 - 5.6 %  7.8   6.5    Chol 100 - 199 mg/dL 960   454   098   HDL >11 mg/dL 76   57   57   Calc LDL 0 - 99 mg/dL 914   782   956   Triglycerides 0 - 149 mg/dL 86   94   213   Creatinine 0.57 - 1.00 mg/dL 0.86   5.78   4.69       06/20/2023   11:29 AM 05/29/2023    1:33 PM 04/17/2023    2:52 PM 02/15/2023   12:05 PM 02/15/2023   11:39 AM 02/15/2023   11:34 AM 01/25/2023    1:41 PM  BP/Weight  Systolic BP 134 128 132 128 138 143 108  Diastolic BP 73 67 80 80 75 70 74  Wt. (Lbs) 296.04 301 303.4   300.04 301  BMI 49.26 kg/m2 49.33 kg/m2 49.72 kg/m2   49.93 kg/m2 50.09 kg/m2      Latest Ref Rng & Units 09/26/2022    3:30 PM 06/08/2022   12:00 AM  Foot/eye exam completion dates  Eye Exam No Retinopathy  No Retinopathy      Foot Form Completion  Done      This result is from an external source.      Deteriorated , managed by Endo

## 2023-06-26 NOTE — Assessment & Plan Note (Signed)
No retained foreign materia in foot, mild erythema, keflex prescribed for 5 days

## 2023-06-26 NOTE — Progress Notes (Signed)
Kendra Hanson     MRN: [id number]      DOB: 1961/01/13  Chief Complaint  Patient presents with   Follow-up    Follow up check left foot where she stepped on glass    HPI See HPI  Kendra Hanson is here for follow up and re-evaluation of chronic medical conditions, medication management and review of any available recent lab and radiology data.  Preventive health is updated, specifically  Cancer screening and Immunization.   Questions or concerns regarding consultations or procedures which the PT has had in the interim are  addressed. The PT denies any adverse reactions to current medications since the last visit.    Denies polyuria, polydipsia, blurred vision , or hypoglycemic episodes.   ROS Denies recent fever or chills. Denies sinus pressure, nasal congestion, ear pain or sore throat. Denies chest congestion, productive cough or wheezing. Denies chest pains, palpitations and leg swelling Denies abdominal pain, nausea, vomiting,diarrhea or constipation.   Denies dysuria, frequency, hesitancy or incontinence. Denies joint pain, swelling and limitation in mobility. Denies headaches, seizures, numbness, or tingling. Denies depression, anxiety or insomnia.    PE  BP 134/73 (BP Location: Right Arm, Patient Position: Sitting, Cuff Size: Large)   Pulse 82   Ht 5\' 5"  (1.651 m)   Wt 296 lb 0.6 oz (134.3 kg)   SpO2 93%   BMI 49.26 kg/m   Patient alert and oriented and in no cardiopulmonary distress.  HEENT: No facial asymmetry, EOMI,     Neck supple .  Chest: Clear to auscultation bilaterally.  CVS: S1, S2 no murmurs, no S3.Regular rate.  ABD: Soft non tender.   Ext: No edema  MS: Adequate  though reduced ROM spine, shoulders, hips and knees.  Skin: small puncture site on left foot, mild erythema, no drainage, mildly tender , no retained glass or foreign body in skin Psych: Good eye contact, normal affect. Memory intact not anxious or depressed appearing.  CNS: CN  2-12 intact, power,  normal throughout.no focal deficits noted.   Assessment & Plan  Cut of foot No retained foreign materia in foot, mild erythema, keflex prescribed for 5 days  Hypertensive disorder DASH diet and commitment to daily physical activity for a minimum of 30 minutes discussed and encouraged, as a part of hypertension management. The importance of attaining a healthy weight is also discussed.     06/20/2023   11:29 AM 05/29/2023    1:33 PM 04/17/2023    2:52 PM 02/15/2023   12:05 PM 02/15/2023   11:39 AM 02/15/2023   11:34 AM 01/25/2023    1:41 PM  BP/Weight  Systolic BP 134 128 132 128 138 143 108  Diastolic BP 73 67 80 80 75 70 74  Wt. (Lbs) 296.04 301 303.4   300.04 301  BMI 49.26 kg/m2 49.33 kg/m2 49.72 kg/m2   49.93 kg/m2 50.09 kg/m2     No med change  Type 2 diabetes mellitus (HCC) Kendra Hanson is reminded of the importance of commitment to daily physical activity for 30 minutes or more, as able and the need to limit carbohydrate intake to 30 to 60 grams per meal to help with blood sugar control.   The need to take medication as prescribed, test blood sugar as directed, and to call between visits if there is a concern that blood sugar is uncontrolled is also discussed.   Kendra Hanson is reminded of the importance of daily foot exam, annual eye examination, and good blood  sugar, blood pressure and cholesterol control.     Latest Ref Rng & Units 06/15/2023   11:04 AM 05/29/2023    1:49 PM 02/13/2023    9:09 AM 01/25/2023    1:57 PM 11/14/2022   11:52 AM  Diabetic Labs  HbA1c 4.0 - 5.6 %  7.8   6.5    Chol 100 - 199 mg/dL 696   295   284   HDL >13 mg/dL 76   57   57   Calc LDL 0 - 99 mg/dL 244   010   272   Triglycerides 0 - 149 mg/dL 86   94   536   Creatinine 0.57 - 1.00 mg/dL 6.44   0.34   7.42       06/20/2023   11:29 AM 05/29/2023    1:33 PM 04/17/2023    2:52 PM 02/15/2023   12:05 PM 02/15/2023   11:39 AM 02/15/2023   11:34 AM 01/25/2023    1:41 PM  BP/Weight   Systolic BP 134 128 132 128 138 143 108  Diastolic BP 73 67 80 80 75 70 74  Wt. (Lbs) 296.04 301 303.4   300.04 301  BMI 49.26 kg/m2 49.33 kg/m2 49.72 kg/m2   49.93 kg/m2 50.09 kg/m2      Latest Ref Rng & Units 09/26/2022    3:30 PM 06/08/2022   12:00 AM  Foot/eye exam completion dates  Eye Exam No Retinopathy  No Retinopathy      Foot Form Completion  Done      This result is from an external source.      Deteriorated , managed by Endo

## 2023-06-26 NOTE — Assessment & Plan Note (Signed)
DASH diet and commitment to daily physical activity for a minimum of 30 minutes discussed and encouraged, as a part of hypertension management. The importance of attaining a healthy weight is also discussed.     06/20/2023   11:29 AM 05/29/2023    1:33 PM 04/17/2023    2:52 PM 02/15/2023   12:05 PM 02/15/2023   11:39 AM 02/15/2023   11:34 AM 01/25/2023    1:41 PM  BP/Weight  Systolic BP 134 128 132 128 138 143 108  Diastolic BP 73 67 80 80 75 70 74  Wt. (Lbs) 296.04 301 303.4   300.04 301  BMI 49.26 kg/m2 49.33 kg/m2 49.72 kg/m2   49.93 kg/m2 50.09 kg/m2     No med change

## 2023-06-29 DIAGNOSIS — E1165 Type 2 diabetes mellitus with hyperglycemia: Secondary | ICD-10-CM | POA: Diagnosis not present

## 2023-07-03 DIAGNOSIS — G4733 Obstructive sleep apnea (adult) (pediatric): Secondary | ICD-10-CM | POA: Diagnosis not present

## 2023-07-05 DIAGNOSIS — E1122 Type 2 diabetes mellitus with diabetic chronic kidney disease: Secondary | ICD-10-CM | POA: Diagnosis not present

## 2023-07-05 DIAGNOSIS — E611 Iron deficiency: Secondary | ICD-10-CM | POA: Diagnosis not present

## 2023-07-05 DIAGNOSIS — N189 Chronic kidney disease, unspecified: Secondary | ICD-10-CM | POA: Diagnosis not present

## 2023-07-05 DIAGNOSIS — I5032 Chronic diastolic (congestive) heart failure: Secondary | ICD-10-CM | POA: Diagnosis not present

## 2023-07-05 DIAGNOSIS — R809 Proteinuria, unspecified: Secondary | ICD-10-CM | POA: Diagnosis not present

## 2023-07-13 ENCOUNTER — Other Ambulatory Visit: Payer: Self-pay

## 2023-07-13 MED ORDER — PRALUENT 150 MG/ML ~~LOC~~ SOAJ: 150.0000 mg | SUBCUTANEOUS | 2 refills | Status: DC

## 2023-07-19 ENCOUNTER — Other Ambulatory Visit: Payer: Self-pay | Admitting: Family Medicine

## 2023-07-19 DIAGNOSIS — I150 Renovascular hypertension: Secondary | ICD-10-CM

## 2023-07-19 MED ORDER — CARVEDILOL 6.25 MG PO TABS: 6.2500 mg | ORAL_TABLET | Freq: Two times a day (BID) | ORAL | 0 refills | Status: DC

## 2023-07-20 DIAGNOSIS — N2581 Secondary hyperparathyroidism of renal origin: Secondary | ICD-10-CM | POA: Diagnosis not present

## 2023-07-20 DIAGNOSIS — E1122 Type 2 diabetes mellitus with diabetic chronic kidney disease: Secondary | ICD-10-CM | POA: Diagnosis not present

## 2023-07-20 DIAGNOSIS — E1129 Type 2 diabetes mellitus with other diabetic kidney complication: Secondary | ICD-10-CM | POA: Diagnosis not present

## 2023-07-20 DIAGNOSIS — R809 Proteinuria, unspecified: Secondary | ICD-10-CM | POA: Diagnosis not present

## 2023-07-26 ENCOUNTER — Telehealth: Payer: Self-pay | Admitting: Nurse Practitioner

## 2023-07-26 NOTE — Telephone Encounter (Signed)
Called and let pt know that pt assistance was here.

## 2023-07-26 NOTE — Telephone Encounter (Signed)
Pts husband picked up pt assistance for novolog 70/30 and pen needles.

## 2023-07-27 ENCOUNTER — Encounter: Payer: Self-pay | Admitting: Family Medicine

## 2023-07-30 ENCOUNTER — Telehealth: Payer: Medicare Other | Admitting: Nurse Practitioner

## 2023-07-30 DIAGNOSIS — K047 Periapical abscess without sinus: Secondary | ICD-10-CM

## 2023-07-30 MED ORDER — AMOXICILLIN-POT CLAVULANATE 875-125 MG PO TABS: 1.0000 | ORAL_TABLET | Freq: Two times a day (BID) | ORAL | 0 refills | Status: AC

## 2023-07-30 NOTE — Progress Notes (Signed)
E-Visit for Dental Pain  We are sorry that you are not feeling well.  Here is how we plan to help!  Based on what you have shared with me in the questionnaire, it sounds like you have an infection under one of your teeth.   Augmentin 875-125mg  twice a day for 7 days  It is imperative that you see a dentist within 10 days of this eVisit to determine the cause of the dental pain and be sure it is adequately treated  A toothache or tooth pain is caused when the nerve in the root of a tooth or surrounding a tooth is irritated. Dental (tooth) infection, decay, injury, or loss of a tooth are the most common causes of dental pain. Pain may also occur after an extraction (tooth is pulled out). Pain sometimes originates from other areas and radiates to the jaw, thus appearing to be tooth pain.Bacteria growing inside your mouth can contribute to gum disease and dental decay, both of which can cause pain. A toothache occurs from inflammation of the central portion of the tooth called pulp. The pulp contains nerve endings that are very sensitive to pain. Inflammation to the pulp or pulpitis may be caused by dental cavities, trauma, and infection.    HOME CARE:   For toothaches: Over-the-counter pain medications such as acetaminophen or ibuprofen may be used. Take these as directed on the package while you arrange for a dental appointment. Avoid very cold or hot foods, because they may make the pain worse. You may get relief from biting on a cotton ball soaked in oil of cloves. You can get oil of cloves at most drug stores.  For jaw pain:  Aspirin may be helpful for problems in the joint of the jaw in adults. If pain happens every time you open your mouth widely, the temporomandibular joint (TMJ) may be the source of the pain. Yawning or taking a large bite of food may worsen the pain. An appointment with your doctor or dentist will help you find the cause.     GET HELP RIGHT AWAY IF:  You have a high  fever or chills If you have had a recent head or face injury and develop headache, light headedness, nausea, vomiting, or other symptoms that concern you after an injury to your face or mouth, you could have a more serious injury in addition to your dental injury. A facial rash associated with a toothache: This condition may improve with medication. Contact your doctor for them to decide what is appropriate. Any jaw pain occurring with chest pain: Although jaw pain is most commonly caused by dental disease, it is sometimes referred pain from other areas. People with heart disease, especially people who have had stents placed, people with diabetes, or those who have had heart surgery may have jaw pain as a symptom of heart attack or angina. If your jaw or tooth pain is associated with lightheadedness, sweating, or shortness of breath, you should see a doctor as soon as possible. Trouble swallowing or excessive pain or bleeding from gums: If you have a history of a weakened immune system, diabetes, or steroid use, you may be more susceptible to infections. Infections can often be more severe and extensive or caused by unusual organisms. Dental and gum infections in people with these conditions may require more aggressive treatment. An abscess may need draining or IV antibiotics, for example.  MAKE SURE YOU   Understand these instructions. Will watch your condition. Will get help  right away if you are not doing well or get worse.  Thank you for choosing an e-visit.  Your e-visit answers were reviewed by a board certified advanced clinical practitioner to complete your personal care plan. Depending upon the condition, your plan could have included both over the counter or prescription medications.  Please review your pharmacy choice. Make sure the pharmacy is open so you can pick up prescription now. If there is a problem, you may contact your provider through [employer] and have the prescription  routed to another pharmacy.  Your safety is important to [place]. If you have drug allergies check your prescription carefully.   For the next 24 hours you can use MyChart to ask questions about today's visit, request a non-urgent call back, or ask for a work or school excuse. You will get an email in the next two days asking about your experience. I hope that your e-visit has been valuable and will speed your recovery.   Meds ordered this encounter  Medications   amoxicillin-clavulanate (AUGMENTIN) 875-125 MG tablet    Sig: Take 1 tablet by mouth 2 (two) times daily for 7 days. Take with food    Dispense:  14 tablet    Refill:  0    I spent approximately 5 minutes reviewing the patient's history, current symptoms and coordinating their care today.

## 2023-07-31 ENCOUNTER — Encounter: Payer: Self-pay | Admitting: Family Medicine

## 2023-07-31 ENCOUNTER — Other Ambulatory Visit: Payer: Self-pay

## 2023-07-31 MED ORDER — DILTIAZEM HCL ER BEADS 420 MG PO CP24: 420.0000 mg | ORAL_CAPSULE | Freq: Every day | ORAL | 0 refills | Status: DC

## 2023-08-07 ENCOUNTER — Telehealth: Payer: Medicare Other | Admitting: Physician Assistant

## 2023-08-07 ENCOUNTER — Other Ambulatory Visit: Payer: Self-pay | Admitting: Family Medicine

## 2023-08-07 DIAGNOSIS — E876 Hypokalemia: Secondary | ICD-10-CM

## 2023-08-07 DIAGNOSIS — T3695XA Adverse effect of unspecified systemic antibiotic, initial encounter: Secondary | ICD-10-CM | POA: Diagnosis not present

## 2023-08-07 DIAGNOSIS — B379 Candidiasis, unspecified: Secondary | ICD-10-CM

## 2023-08-07 MED ORDER — FLUCONAZOLE 150 MG PO TABS: 150.0000 mg | ORAL_TABLET | ORAL | 0 refills | Status: DC | PRN

## 2023-08-07 NOTE — Progress Notes (Signed)

## 2023-08-08 ENCOUNTER — Other Ambulatory Visit: Payer: Self-pay | Admitting: *Deleted

## 2023-08-08 DIAGNOSIS — I872 Venous insufficiency (chronic) (peripheral): Secondary | ICD-10-CM

## 2023-08-08 DIAGNOSIS — I890 Lymphedema, not elsewhere classified: Secondary | ICD-10-CM

## 2023-08-13 ENCOUNTER — Other Ambulatory Visit: Payer: Self-pay

## 2023-08-13 ENCOUNTER — Other Ambulatory Visit: Payer: Self-pay | Admitting: Allergy & Immunology

## 2023-08-13 ENCOUNTER — Encounter: Payer: Self-pay | Admitting: Family Medicine

## 2023-08-13 MED ORDER — PANTOPRAZOLE SODIUM 20 MG PO TBEC: 20.0000 mg | DELAYED_RELEASE_TABLET | Freq: Every day | ORAL | 1 refills | Status: DC

## 2023-08-14 ENCOUNTER — Other Ambulatory Visit: Payer: Self-pay

## 2023-08-14 ENCOUNTER — Encounter: Payer: Self-pay | Admitting: Family Medicine

## 2023-08-14 ENCOUNTER — Encounter: Payer: Self-pay | Admitting: Emergency Medicine

## 2023-08-14 ENCOUNTER — Ambulatory Visit: Payer: Medicare Other | Admitting: Vascular Surgery

## 2023-08-14 ENCOUNTER — Encounter (HOSPITAL_COMMUNITY): Payer: Medicare Other

## 2023-08-14 ENCOUNTER — Ambulatory Visit
Admission: EM | Admit: 2023-08-14 | Discharge: 2023-08-14 | Disposition: A | Discharge status: Home / Self Care | Payer: Medicare Other | Attending: Family Medicine | Admitting: Family Medicine

## 2023-08-14 DIAGNOSIS — R002 Palpitations: Secondary | ICD-10-CM | POA: Diagnosis not present

## 2023-08-14 DIAGNOSIS — K219 Gastro-esophageal reflux disease without esophagitis: Secondary | ICD-10-CM | POA: Diagnosis not present

## 2023-08-14 MED ORDER — LIDOCAINE VISCOUS HCL 2 % MT SOLN
15.0000 mL | Freq: Once | OROMUCOSAL | Status: AC
  Administered 2023-08-14: 15 mL via OROMUCOSAL

## 2023-08-14 MED ORDER — ALUM & MAG HYDROXIDE-SIMETH 200-200-20 MG/5ML PO SUSP
30.0000 mL | Freq: Once | ORAL | Status: AC
  Administered 2023-08-14: 30 mL via ORAL

## 2023-08-14 NOTE — Discharge Instructions (Signed)
Continue taking your Protonix, we have given you a GI cocktail today as well to help soothe your upper GI tract.  You may also take Pepcid, Tums as needed until feeling better.  Follow-up with your cardiologist regarding your palpitations the other day and PCP for recheck of your reflux type symptoms.  Go to the ER if your symptoms significantly worsen at any time.

## 2023-08-14 NOTE — ED Triage Notes (Signed)
Pt reports is on acid reflux medication and reports has been out since Saturday. Called pharmacy yesterday and got px refilled for pantoprazole but reports even with px restarted "I've been burping up my food" and "palpitations yesterday".

## 2023-08-15 ENCOUNTER — Other Ambulatory Visit: Payer: Self-pay

## 2023-08-15 DIAGNOSIS — I5032 Chronic diastolic (congestive) heart failure: Secondary | ICD-10-CM | POA: Diagnosis not present

## 2023-08-15 DIAGNOSIS — I129 Hypertensive chronic kidney disease with stage 1 through stage 4 chronic kidney disease, or unspecified chronic kidney disease: Secondary | ICD-10-CM | POA: Diagnosis not present

## 2023-08-15 DIAGNOSIS — K219 Gastro-esophageal reflux disease without esophagitis: Secondary | ICD-10-CM | POA: Diagnosis not present

## 2023-08-15 DIAGNOSIS — R001 Bradycardia, unspecified: Secondary | ICD-10-CM | POA: Diagnosis not present

## 2023-08-15 MED ORDER — FAMOTIDINE 40 MG PO TABS: 40.0000 mg | ORAL_TABLET | Freq: Two times a day (BID) | ORAL | 1 refills | Status: DC

## 2023-08-16 NOTE — Telephone Encounter (Signed)
Pts spouse picked up
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
## 2023-08-23 ENCOUNTER — Ambulatory Visit (INDEPENDENT_AMBULATORY_CARE_PROVIDER_SITE_OTHER): Payer: Medicare Other

## 2023-08-23 DIAGNOSIS — Z23 Encounter for immunization: Secondary | ICD-10-CM

## 2023-08-28 ENCOUNTER — Other Ambulatory Visit: Payer: Self-pay | Admitting: Family Medicine

## 2023-08-28 DIAGNOSIS — I150 Renovascular hypertension: Secondary | ICD-10-CM

## 2023-09-04 ENCOUNTER — Ambulatory Visit: Payer: Medicare Other | Attending: Internal Medicine | Admitting: Internal Medicine

## 2023-09-04 ENCOUNTER — Encounter: Payer: Self-pay | Admitting: Internal Medicine

## 2023-09-04 VITALS — BP 126/82 | HR 79 | Ht 65.0 in | Wt 306.8 lb

## 2023-09-04 DIAGNOSIS — R002 Palpitations: Secondary | ICD-10-CM | POA: Diagnosis not present

## 2023-09-04 DIAGNOSIS — R001 Bradycardia, unspecified: Secondary | ICD-10-CM | POA: Diagnosis not present

## 2023-09-04 NOTE — Patient Instructions (Signed)
Medication Instructions:  Your physician has recommended you make the following change in your medication:  Stop taking Coreg Continue taking all other medications as prescribed  Labwork: None  Testing/Procedures: None  Follow-Up: Your physician recommends that you schedule a follow-up appointment in: 1 year. You will receive a reminder call in about 8 months reminding you to schedule your appointment. If you don't receive this call, please contact our office.   Any Other Special Instructions Will Be Listed Below (If Applicable).   Thank you for choosing [facility]!      If you need a refill on your cardiac medications before your next appointment, please call your pharmacy.
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
## 2023-09-13 ENCOUNTER — Encounter: Payer: Self-pay | Admitting: Nurse Practitioner

## 2023-09-13 DIAGNOSIS — E785 Hyperlipidemia, unspecified: Secondary | ICD-10-CM | POA: Diagnosis not present

## 2023-09-13 DIAGNOSIS — I10 Essential (primary) hypertension: Secondary | ICD-10-CM | POA: Diagnosis not present

## 2023-09-14 LAB — CMP14+EGFR
ALT: 13 [IU]/L (ref 0–32)
AST: 16 [IU]/L (ref 0–40)
Albumin: 4.1 g/dL (ref 3.9–4.9)
Alkaline Phosphatase: 134 [IU]/L — ABNORMAL HIGH (ref 44–121)
BUN/Creatinine Ratio: 18 (ref 12–28)
BUN: 24 mg/dL (ref 8–27)
Bilirubin Total: 0.5 mg/dL (ref 0.0–1.2)
CO2: 26 mmol/L (ref 20–29)
Calcium: 9.5 mg/dL (ref 8.7–10.3)
Chloride: 104 mmol/L (ref 96–106)
Creatinine, Ser: 1.33 mg/dL — ABNORMAL HIGH (ref 0.57–1.00)
Globulin, Total: 2.8 g/dL (ref 1.5–4.5)
Glucose: 145 mg/dL — ABNORMAL HIGH (ref 70–99)
Potassium: 4.5 mmol/L (ref 3.5–5.2)
Sodium: 144 mmol/L (ref 134–144)
Total Protein: 6.9 g/dL (ref 6.0–8.5)
eGFR: 45 mL/min/{1.73_m2} — ABNORMAL LOW (ref >59)

## 2023-09-14 LAB — LIPID PANEL
Chol/HDL Ratio: 3.8 {ratio} (ref 0.0–4.4)
Cholesterol, Total: 240 mg/dL — ABNORMAL HIGH (ref 100–199)
HDL: 64 mg/dL (ref >39)
LDL Chol Calc (NIH): 158 mg/dL — ABNORMAL HIGH (ref 0–99)
Triglycerides: 104 mg/dL (ref 0–149)
VLDL Cholesterol Cal: 18 mg/dL (ref 5–40)

## 2023-09-17 ENCOUNTER — Other Ambulatory Visit: Payer: Self-pay | Admitting: Family Medicine

## 2023-09-17 ENCOUNTER — Encounter: Payer: Medicare Other | Attending: Nurse Practitioner | Admitting: Dietician

## 2023-09-17 DIAGNOSIS — E876 Hypokalemia: Secondary | ICD-10-CM

## 2023-09-17 DIAGNOSIS — Z794 Long term (current) use of insulin: Secondary | ICD-10-CM | POA: Diagnosis not present

## 2023-09-17 DIAGNOSIS — E1122 Type 2 diabetes mellitus with diabetic chronic kidney disease: Secondary | ICD-10-CM | POA: Insufficient documentation

## 2023-09-17 DIAGNOSIS — N183 Chronic kidney disease, stage 3 unspecified: Secondary | ICD-10-CM | POA: Insufficient documentation

## 2023-09-17 DIAGNOSIS — N1832 Chronic kidney disease, stage 3b: Secondary | ICD-10-CM | POA: Insufficient documentation

## 2023-09-18 ENCOUNTER — Telehealth: Payer: Self-pay | Admitting: Nurse Practitioner

## 2023-09-18 ENCOUNTER — Other Ambulatory Visit: Payer: Self-pay | Admitting: *Deleted

## 2023-09-18 DIAGNOSIS — I872 Venous insufficiency (chronic) (peripheral): Secondary | ICD-10-CM

## 2023-09-18 NOTE — Telephone Encounter (Signed)
Pt coming to office to come pick up novo nordisk application to reapply for 2025

## 2023-09-18 NOTE — Telephone Encounter (Signed)
Pt is asking if we got the her recert paperwork for her novo nordisk

## 2023-09-20 ENCOUNTER — Encounter: Payer: Self-pay | Admitting: Family Medicine

## 2023-09-20 ENCOUNTER — Ambulatory Visit (INDEPENDENT_AMBULATORY_CARE_PROVIDER_SITE_OTHER): Payer: Medicare Other | Admitting: Family Medicine

## 2023-09-20 VITALS — BP 132/78 | HR 95 | Ht 65.0 in | Wt 302.1 lb

## 2023-09-20 DIAGNOSIS — E785 Hyperlipidemia, unspecified: Secondary | ICD-10-CM | POA: Diagnosis not present

## 2023-09-20 DIAGNOSIS — Z789 Other specified health status: Secondary | ICD-10-CM

## 2023-09-20 DIAGNOSIS — N1832 Chronic kidney disease, stage 3b: Secondary | ICD-10-CM | POA: Diagnosis not present

## 2023-09-20 DIAGNOSIS — E1122 Type 2 diabetes mellitus with diabetic chronic kidney disease: Secondary | ICD-10-CM | POA: Diagnosis not present

## 2023-09-20 DIAGNOSIS — K219 Gastro-esophageal reflux disease without esophagitis: Secondary | ICD-10-CM | POA: Diagnosis not present

## 2023-09-20 DIAGNOSIS — N183 Chronic kidney disease, stage 3 unspecified: Secondary | ICD-10-CM

## 2023-09-20 DIAGNOSIS — Z794 Long term (current) use of insulin: Secondary | ICD-10-CM

## 2023-09-20 DIAGNOSIS — I110 Hypertensive heart disease with heart failure: Secondary | ICD-10-CM | POA: Diagnosis not present

## 2023-09-20 MED ORDER — ROSUVASTATIN CALCIUM 10 MG PO TABS: 10.0000 mg | ORAL_TABLET | Freq: Every day | ORAL | 3 refills | Status: AC

## 2023-09-20 NOTE — Patient Instructions (Addendum)
F/U in 13 weeks,call if you need me sooner  No nuts, shrimp, margarine, butter , oils, new med for cholesterol, stop praluent  You are being referred to Dr Tasia Catchings re [id number]   Fasting Lipid, cmp and eGFr 3 to 5 days bnefore next appointment  Thanks for choosing [facility], we consider it a privelige to serve you.

## 2023-09-21 ENCOUNTER — Other Ambulatory Visit: Payer: Self-pay | Admitting: Nurse Practitioner

## 2023-09-21 DIAGNOSIS — E1122 Type 2 diabetes mellitus with diabetic chronic kidney disease: Secondary | ICD-10-CM

## 2023-09-21 NOTE — Patient Instructions (Addendum)
Reduce your sodium intake  Look at the %DV for sodium on the food label.  This should be less than 8% for most items.  Eat simple meals prepared at home most often.  Sample mea ideas:  Breakfast:    egg beaters (opt sauteed spinach, onions, peppers), whole wheat toast, fresh fruit Whole wheat toast with peanut butter, fresh fruit [place] yogurt, fresh fruit Oatmeal, fruit, boiled egg   Lunch/dinner:  1.  Baked/grilled chickens, small sweet potato or white potat0 with small amout margarine, steamed vegetables  2.  Homemade vegetable soup, open faced cheese toast (whole wheat and low sodium cheese)  3.  Low sodium tuna or chicken sandwich on whole wheat, fresh fruit  4.  Beans, brown rice, greens

## 2023-09-21 NOTE — Progress Notes (Signed)
Medical Nutrition Therapy  Appointment Start time:  1150  Appointment End time: 1220 Phone visit.  Patient is in her home and I am at my office.  She was last seen by myself 06/25/2023.  Today:  09/17/2023 Patient states that she has decreased her sodium intake and decreased her eating out.  Although 24 hour recall does not reflect this. States that she is concerned about her increased fluid gain. Complains of increased heartburn.   Reports recent prednisone.  Primary concerns: She would like to learn more about nutrition that is good for diabetes and CKD Referral diagnosis: Type 2 diabetes and stage 3b CKD  Preferred learning style: no preference indicated Learning readiness: ready  NUTRITION ASSESSMENT  Anthropometrics  65" 302 lbs 09/20/2023 301 lbs 01/25/2023 306 lbs 12/24/2022 317 lbs 09/26/2022 328 lbs 08/22/2022 skipped her fluid pill yesterday 318 lbs 08/09/2022  UBW  Clinical Medical Hx: Type 2 Diabetes, CKD stage 2b, lymphedema, OSA c-pap (using this more 2/24), GERD, HTN, HLD, anemia, vitamin D deficiency Medications/Supplements: 20 units q am and  HS Humulin 70/30 , Ozempic, torsemide, potassium, calcitriol, MVI, Repatha Labs: A1C 7.8% 05/29/2023 increased from 6.5% 01/25/2023 BUN 24, Creatinine 1.33, Potassium 4.5 on 09/13/2023 and eGFR 23 03/02/2022 Lipid Panel     Component Value Date/Time   CHOL 240 (H) 09/13/2023 1142   TRIG 104 09/13/2023 1142   HDL 64 09/13/2023 1142   CHOLHDL 3.8 09/13/2023 1142   CHOLHDL 4.1 09/02/2019 0846   VLDL 15 04/12/2017 1121   LDLCALC 158 (H) 09/13/2023 1142   LDLCALC 131 (H) 09/02/2019 0846   LABVLDL 18 09/13/2023 1142   CGM:  Dexcom Notable Signs/Symptoms: swelling feet, fluid weight gain  Lifestyle & Dietary Hx Patient lives with her husband.  They share shopping and cooking.  She uses an Engineer, petroleum at the store.  Eats out 3-4 times per week (asks for a to-go box at the beginning of the meal) She is on disability.  She used  to work as an Loss adjuster, chartered for 40 years. Avoids pork, uses I can't believe it's not butter, raw vegetables make her nauseas Has dentures and unable to tolerate hard food  Estimated daily fluid intake: 64 oz fluid restriction Sleep: 6-7 hours hours "better with c-pap" Stress / self-care: none Current average weekly physical activity: lymphedema and knee problems prohibit  24-Hr Dietary Recall First Meal:  Hamburger patty and nutrigrain bar OR egg beaters Snack:  Second Meal:  hamburger patty and potato soup from [employer] Third Meal:  grilled [place] chicken, steamed vegetables (1/2 plate) from [employer] Snack:  lightly salted peanuts, vanilla wafers, popcorn Beverages: water, diet soda, herbal tea, cryustal light, occasional regular gingerale  Estimated Energy Needs Protein: 85-95 g  NUTRITION DIAGNOSIS  NB-1.1 Food and nutrition-related knowledge deficit As related to balance of carbohydrates, protein, and fat along with guidelines for CKD.  As evidenced by diet hx and patient report.  NUTRITION INTERVENTION  Nutrition education (E-1) on the following topics:  continued Reducing sodium and sugar Behavioral changes  Handouts Provided Include - previous visit NKD national kidney diet - Dish up a Kidney-Friendly Meal for Patients with Chronic Kidney Disease (not on dialysis) Label reading [employer] of Lifestyle Medicine packet.  Learning Style & Readiness for Change Teaching method utilized: Visual & Auditory  Demonstrated degree of understanding via: Teach Back  Barriers to learning/adherence to lifestyle change: health issues  Goals Small meals throughout the day -Breakfast, lunch, dinner Avoid skipping meals Decreased processed meat,  Avoid added salt Avoid/limit juice, regular soda, and other carbohydrate containing beverages  Avoid seasonings with potassium in the ingredient list. Avoid foods with Phos... in the ingredient list.  Blood glucose  goals:  80-130 fasting   100-180 two hours after starting any meal  Generally a rise of 40-60 points after a meal is normal  Low sodium options: [employer] palate low sodium pasta sauce   Reduce your sodium intake  Look at the %DV for sodium on the food label.  This should be less than 8% for most items.  Eat simple meals prepared at home most often.  Sample mea ideas:  Breakfast:    egg beaters (opt sauteed spinach, onions, peppers), whole wheat toast, fresh fruit Whole wheat toast with peanut butter, fresh fruit [place] yogurt, fresh fruit Oatmeal, fruit, boiled egg   Lunch/dinner:  1.  Baked/grilled chickens, small sweet potato or white potat0 with small amout margarine, steamed vegetables  2.  Homemade vegetable soup, open faced cheese toast (whole wheat and low sodium cheese)  3.  Low sodium tuna or chicken sandwich on whole wheat, fresh fruit  4.  Beans, brown rice, greens  MONITORING & EVALUATION Dietary intake, weekly physical activity, and label reading in 4 months.  Next Steps  Patient is to call for questions.

## 2023-09-21 NOTE — Progress Notes (Signed)
amb  

## 2023-09-24 ENCOUNTER — Telehealth: Payer: Self-pay | Admitting: Internal Medicine

## 2023-09-24 ENCOUNTER — Encounter: Payer: Self-pay | Admitting: Internal Medicine

## 2023-09-24 NOTE — Assessment & Plan Note (Signed)
Deteriorated  Patient re-educated about  the importance of commitment to a  minimum of 150 minutes of exercise per week as able.  The importance of healthy food choices with portion control discussed, as well as eating regularly and within a 12 hour window most days. The need to choose "clean , green" food 50 to 75% of the time is discussed, as well as to make water the primary drink and set a goal of 64 ounces water daily.       09/20/2023   11:21 AM 09/04/2023    9:14 AM 06/20/2023   11:29 AM  Weight /BMI  Weight 302 lb 1.9 oz 306 lb 12.8 oz 296 lb 0.6 oz  Height 5\' 5"  (1.651 m) 5\' 5"  (1.651 m) 5\' 5"  (1.651 m)  BMI 50.28 kg/m2 51.05 kg/m2 49.26 kg/m2

## 2023-09-24 NOTE — Assessment & Plan Note (Signed)
Controlled, no change in medication DASH diet and commitment to daily physical activity for a minimum of 30 minutes discussed and encouraged, as a part of hypertension management. The importance of attaining a healthy weight is also discussed.     09/20/2023   11:21 AM 09/04/2023    9:14 AM 08/14/2023    6:48 PM 06/20/2023   11:29 AM 05/29/2023    1:33 PM 04/17/2023    2:52 PM 02/15/2023   12:05 PM  BP/Weight  Systolic BP 132 126 136 134 128 132 128  Diastolic BP 78 82 64 73 67 80 80  Wt. (Lbs) 302.12 306.8  296.04 301 303.4   BMI 50.28 kg/m2 51.05 kg/m2  49.26 kg/m2 49.33 kg/m2 49.72 kg/m2

## 2023-09-24 NOTE — Progress Notes (Signed)
Kendra Hanson     MRN: [id number]      DOB: 01-Dec-1960  Chief Complaint  Patient presents with   Follow-up    Follow up chest discomfort/ acid reflux since starting Praluent seen at urgent care     HPI Kendra Hanson is here for follow up and re-evaluation of chronic medical conditions, medication management and review of any available recent lab and radiology data.  Preventive health is updated, specifically  Cancer screening and Immunization.   Questions or concerns regarding consultations or procedures which the PT has had in the interim are  addressed. The PT feels thatprsluent has increased her GERD symptoms and is disappointed re the results of recent lipid panel. Wants to change med   ROS Denies recent fever or chills. Denies sinus pressure, nasal congestion, ear pain or sore throat. Denies chest congestion, productive cough or wheezing. Denies chest pains, palpitations and leg swelling Denies a nausea, vomiting,diarrhea or constipation.   Denies dysuria, frequency, hesitancy or incontinence. Denies joint pain, swelling and limitation in mobility. Denies headaches, seizures, numbness, or tingling. Denies depression, anxiety or insomnia. Denies skin break down or rash.   PE  BP 132/78 (BP Location: Right Arm, Patient Position: Sitting, Cuff Size: Large)   Pulse 95   Ht 5\' 5"  (1.651 m)   Wt (!) 302 lb 1.9 oz (137 kg)   SpO2 92%   BMI 50.28 kg/m   Patient alert and oriented and in no cardiopulmonary distress.  HEENT: No facial asymmetry, EOMI,     Neck supple .  Chest: Clear to auscultation bilaterally.  CVS: S1, S2 no murmurs, no S3.Regular rate.  ABD: Soft mild epigastric superficial tenderness.   Ext: No edema  MS: Reduced  ROM spine, shoulders, hips and knees.  Skin: Intact, no ulcerations or rash noted.  Psych: Good eye contact, normal affect. Memory intact not anxious or depressed appearing.  CNS: CN 2-12 intact, power,  normal throughout.no focal  deficits noted.   Assessment & Plan  Hypertensive heart disease with heart failure (HCC) Controlled, no change in medication DASH diet and commitment to daily physical activity for a minimum of 30 minutes discussed and encouraged, as a part of hypertension management. The importance of attaining a healthy weight is also discussed.     09/20/2023   11:21 AM 09/04/2023    9:14 AM 08/14/2023    6:48 PM 06/20/2023   11:29 AM 05/29/2023    1:33 PM 04/17/2023    2:52 PM 02/15/2023   12:05 PM  BP/Weight  Systolic BP 132 126 136 134 128 132 128  Diastolic BP 78 82 64 73 67 80 80  Wt. (Lbs) 302.12 306.8  296.04 301 303.4   BMI 50.28 kg/m2 51.05 kg/m2  49.26 kg/m2 49.33 kg/m2 49.72 kg/m2        Hyperlipidemia LDL goal <100 Hyperlipidemia:Low fat diet discussed and encouraged.   Lipid Panel  Lab Results  Component Value Date   CHOL 240 (H) 09/13/2023   HDL 64 09/13/2023   LDLCALC 158 (H) 09/13/2023   TRIG 104 09/13/2023   CHOLHDL 3.8 09/13/2023     Uncontrolled , resue crestor and stop praluent Updated lab needed at/ before next visit.   Morbid obesity (HCC) Deteriorated  Patient re-educated about  the importance of commitment to a  minimum of 150 minutes of exercise per week as able.  The importance of healthy food choices with portion control discussed, as well as eating regularly and within a 12  hour window most days. The need to choose "clean , green" food 50 to 75% of the time is discussed, as well as to make water the primary drink and set a goal of 64 ounces water daily.       09/20/2023   11:21 AM 09/04/2023    9:14 AM 06/20/2023   11:29 AM  Weight /BMI  Weight 302 lb 1.9 oz 306 lb 12.8 oz 296 lb 0.6 oz  Height 5\' 5"  (1.651 m) 5\' 5"  (1.651 m) 5\' 5"  (1.651 m)  BMI 50.28 kg/m2 51.05 kg/m2 49.26 kg/m2      Type 2 diabetes mellitus (HCC) Kendra Hanson is reminded of the importance of commitment to daily physical activity for 30 minutes or more, as able and the need  to limit carbohydrate intake to 30 to 60 grams per meal to help with blood sugar control.   The need to take medication as prescribed, test blood sugar as directed, and to call between visits if there is a concern that blood sugar is uncontrolled is also discussed.   Kendra Hanson is reminded of the importance of daily foot exam, annual eye examination, and good blood sugar, blood pressure and cholesterol control.     Latest Ref Rng & Units 09/13/2023   11:42 AM 06/15/2023   11:04 AM 05/29/2023    1:49 PM 02/13/2023    9:09 AM 01/25/2023    1:57 PM  Diabetic Labs  HbA1c 4.0 - 5.6 %   7.8   6.5   Chol 100 - 199 mg/dL 244  010   272    HDL >53 mg/dL 64  76   57    Calc LDL 0 - 99 mg/dL 664  403   474    Triglycerides 0 - 149 mg/dL 259  86   94    Creatinine 0.57 - 1.00 mg/dL 5.63  8.75   6.43        09/20/2023   11:21 AM 09/04/2023    9:14 AM 08/14/2023    6:48 PM 06/20/2023   11:29 AM 05/29/2023    1:33 PM 04/17/2023    2:52 PM 02/15/2023   12:05 PM  BP/Weight  Systolic BP 132 126 136 134 128 132 128  Diastolic BP 78 82 64 73 67 80 80  Wt. (Lbs) 302.12 306.8  296.04 301 303.4   BMI 50.28 kg/m2 51.05 kg/m2  49.26 kg/m2 49.33 kg/m2 49.72 kg/m2       Latest Ref Rng & Units 09/26/2022    3:30 PM 06/08/2022   12:00 AM  Foot/eye exam completion dates  Eye Exam No Retinopathy  No Retinopathy      Foot Form Completion  Done      This result is from an external source.      Improving but still not at goal, managed by Endo  Stage 3b chronic kidney disease (HCC) Followed by Nephrology  Statin intolerance Trial of crestor , pt aware tht muscle and not joint pain is from statin, has osteo  Gastroesophageal reflux disease Currently uncontroled, unclear if related to medication though doubt it, refer  gI

## 2023-09-24 NOTE — Assessment & Plan Note (Signed)
Trial of crestor , pt aware tht muscle and not joint pain is from statin, has osteo

## 2023-09-24 NOTE — Assessment & Plan Note (Signed)
Ms. Onishi is reminded of the importance of commitment to daily physical activity for 30 minutes or more, as able and the need to limit carbohydrate intake to 30 to 60 grams per meal to help with blood sugar control.   The need to take medication as prescribed, test blood sugar as directed, and to call between visits if there is a concern that blood sugar is uncontrolled is also discussed.   Ms. Teachman is reminded of the importance of daily foot exam, annual eye examination, and good blood sugar, blood pressure and cholesterol control.     Latest Ref Rng & Units 09/13/2023   11:42 AM 06/15/2023   11:04 AM 05/29/2023    1:49 PM 02/13/2023    9:09 AM 01/25/2023    1:57 PM  Diabetic Labs  HbA1c 4.0 - 5.6 %   7.8   6.5   Chol 100 - 199 mg/dL 161  096   045    HDL >40 mg/dL 64  76   57    Calc LDL 0 - 99 mg/dL 981  191   478    Triglycerides 0 - 149 mg/dL 295  86   94    Creatinine 0.57 - 1.00 mg/dL 6.21  3.08   6.57        09/20/2023   11:21 AM 09/04/2023    9:14 AM 08/14/2023    6:48 PM 06/20/2023   11:29 AM 05/29/2023    1:33 PM 04/17/2023    2:52 PM 02/15/2023   12:05 PM  BP/Weight  Systolic BP 132 126 136 134 128 132 128  Diastolic BP 78 82 64 73 67 80 80  Wt. (Lbs) 302.12 306.8  296.04 301 303.4   BMI 50.28 kg/m2 51.05 kg/m2  49.26 kg/m2 49.33 kg/m2 49.72 kg/m2       Latest Ref Rng & Units 09/26/2022    3:30 PM 06/08/2022   12:00 AM  Foot/eye exam completion dates  Eye Exam No Retinopathy  No Retinopathy      Foot Form Completion  Done      This result is from an external source.      Improving but still not at goal, managed by Endo

## 2023-09-24 NOTE — Assessment & Plan Note (Signed)
Hyperlipidemia:Low fat diet discussed and encouraged.   Lipid Panel  Lab Results  Component Value Date   CHOL 240 (H) 09/13/2023   HDL 64 09/13/2023   LDLCALC 158 (H) 09/13/2023   TRIG 104 09/13/2023   CHOLHDL 3.8 09/13/2023     Uncontrolled , resue crestor and stop praluent Updated lab needed at/ before next visit.

## 2023-09-24 NOTE — Assessment & Plan Note (Signed)
Currently uncontroled, unclear if related to medication though doubt it, refer  gI

## 2023-09-24 NOTE — Telephone Encounter (Signed)
Patient is returning call about this mychart message:  Am I supposed to be taking 81 mg aspirin daily? I didn't know if it affect my kidneys since I have stage Three kidney failure.

## 2023-09-24 NOTE — Assessment & Plan Note (Signed)
Followed by Nephrology 

## 2023-09-25 ENCOUNTER — Encounter (INDEPENDENT_AMBULATORY_CARE_PROVIDER_SITE_OTHER): Payer: Self-pay | Admitting: *Deleted

## 2023-09-25 NOTE — Telephone Encounter (Signed)
Responded to pts MyChart message.

## 2023-09-26 ENCOUNTER — Encounter (HOSPITAL_COMMUNITY): Payer: Medicare Other

## 2023-09-26 ENCOUNTER — Ambulatory Visit: Payer: Medicare Other | Admitting: Vascular Surgery

## 2023-09-27 DIAGNOSIS — M170 Bilateral primary osteoarthritis of knee: Secondary | ICD-10-CM | POA: Diagnosis not present

## 2023-10-01 ENCOUNTER — Encounter: Payer: Self-pay | Admitting: Nurse Practitioner

## 2023-10-01 ENCOUNTER — Ambulatory Visit: Payer: Medicare Other | Admitting: Nurse Practitioner

## 2023-10-01 ENCOUNTER — Telehealth: Payer: Self-pay | Admitting: Nurse Practitioner

## 2023-10-01 VITALS — BP 125/70 | HR 69 | Ht 65.0 in | Wt 300.2 lb

## 2023-10-01 DIAGNOSIS — N1832 Chronic kidney disease, stage 3b: Secondary | ICD-10-CM | POA: Diagnosis not present

## 2023-10-01 DIAGNOSIS — Z794 Long term (current) use of insulin: Secondary | ICD-10-CM | POA: Diagnosis not present

## 2023-10-01 DIAGNOSIS — E1122 Type 2 diabetes mellitus with diabetic chronic kidney disease: Secondary | ICD-10-CM | POA: Diagnosis not present

## 2023-10-01 DIAGNOSIS — E559 Vitamin D deficiency, unspecified: Secondary | ICD-10-CM

## 2023-10-01 DIAGNOSIS — I10 Essential (primary) hypertension: Secondary | ICD-10-CM | POA: Diagnosis not present

## 2023-10-01 DIAGNOSIS — E782 Mixed hyperlipidemia: Secondary | ICD-10-CM | POA: Diagnosis not present

## 2023-10-01 DIAGNOSIS — Z7985 Long-term (current) use of injectable non-insulin antidiabetic drugs: Secondary | ICD-10-CM

## 2023-10-01 LAB — POCT GLYCOSYLATED HEMOGLOBIN (HGB A1C): Hemoglobin A1C: 7.6 % — AB (ref 4.0–5.6)
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
## 2023-10-01 NOTE — Patient Instructions (Signed)
 High-Fiber Eating Plan Fiber, also called dietary fiber, is found in foods such as fruits, vegetables, whole grains, and beans. A high-fiber diet can be good for your health. Your health care provider may recommend a high-fiber diet to help: Prevent trouble pooping (constipation). Lower your cholesterol. Treat the following conditions: Hemorrhoids. This is inflammation of veins in the anus. Inflammation of specific areas of the digestive tract. Irritable bowel syndrome (IBS). This is a problem of the large intestine, also called the colon, that sometimes causes belly pain and bloating. Prevent overeating as part of a weight-loss plan. Lower the risk of heart disease, type 2 diabetes, and certain cancers. What are tips for following this plan? Reading food labels  Check the nutrition facts label on foods for the amount of dietary fiber. Choose foods that have 4 grams of fiber or more per serving. The recommended goals for how much fiber you should eat each day include: Males 40 years old or younger: 30-34 g. Males over 37 years old: 28-34 g. Females 64 years old or younger: 25-28 g. Females over 69 years old: 22-25 g. Your daily fiber goal is _____________ g. Shopping Choose whole fruits and vegetables instead of processed. For example, choose apples instead of apple juice or applesauce. Choose a variety of high-fiber foods such as avocados, lentils, oats, and pinto beans. Read the nutrition facts label on foods. Check for foods with added fiber. These foods often have high sugar and salt (sodium) amounts per serving. Cooking Use whole-grain flour for baking and cooking. Cook with brown rice instead of white rice. Make meals that have a lot of beans and vegetables in them, such as chili or vegetable-based soups. Meal planning Start the day with a breakfast that is high in fiber, such as a cereal that has 5 g of fiber or more per serving. Eat breads and cereals that are made with  whole-grain flour instead of refined flour or white flour. Eat brown rice, bulgur wheat, or millet instead of white rice. Use beans in place of meat in soups, salads, and pasta dishes. Be sure that half of the grains you eat each day are whole grains. General information You can get the recommended amount of dietary fiber by: Eating a variety of fruits, vegetables, grains, nuts, and beans. Taking a fiber supplement if you aren't able to eat enough fiber. It's better to get fiber through food than from a supplement. Slowly increase how much fiber you eat. If you increase the amount of fiber you eat too quickly, you may have bloating, cramping, or gas. Drink plenty of water to help you digest fiber. Choose high-fiber snacks, such as berries, raw vegetables, nuts, and popcorn. What foods should I eat? Fruits Berries. Pears. Apples. Oranges. Avocado. Prunes and raisins. Dried figs. Vegetables Sweet potatoes. Spinach. Kale. Artichokes. Cabbage. Broccoli. Cauliflower. Green peas. Carrots. Squash. Grains Whole-grain breads. Multigrain cereal. Oats and oatmeal. Brown rice. Barley. Bulgur wheat. Millet. Quinoa. Bran muffins. Popcorn. Rye wafer crackers. Meats and other proteins Navy beans, kidney beans, and pinto beans. Soybeans. Split peas. Lentils. Nuts and seeds. Dairy Fiber-fortified yogurt. Fortified means that fiber has been added to the product. Beverages Fiber-fortified soy milk. Fiber-fortified orange juice. Other foods Fiber bars. The items listed above may not be all the foods and drinks you can have. Talk to a dietitian to learn more. What foods should I avoid? Fruits Fruit juice. Cooked, strained fruit. Vegetables Fried potatoes. Canned vegetables. Well-cooked vegetables. Grains White bread. Pasta made with refined  flour. White rice. Meats and other proteins Fatty meat. Fried chicken or fried fish. Dairy Milk. Cream cheese. Sour cream. Fats and  oils Butters. Beverages Soft drinks. Other foods Cakes and pastries. The items listed above may not be all the foods and drinks you should avoid. Talk to a dietitian to learn more. This information is not intended to replace advice given to you by your health care provider. Make sure you discuss any questions you have with your health care provider. Document Revised: 01/22/2023 Document Reviewed: 01/22/2023 Elsevier Patient Education  2024 [employer].

## 2023-10-02 ENCOUNTER — Encounter: Payer: Self-pay | Admitting: Nurse Practitioner

## 2023-10-02 NOTE — Telephone Encounter (Signed)
Patient was called and made aware. 

## 2023-10-03 ENCOUNTER — Encounter: Payer: Self-pay | Admitting: Nurse Practitioner

## 2023-10-10 ENCOUNTER — Ambulatory Visit (INDEPENDENT_AMBULATORY_CARE_PROVIDER_SITE_OTHER): Payer: Medicare Other | Admitting: Gastroenterology

## 2023-10-18 DIAGNOSIS — N189 Chronic kidney disease, unspecified: Secondary | ICD-10-CM | POA: Diagnosis not present

## 2023-10-18 DIAGNOSIS — I129 Hypertensive chronic kidney disease with stage 1 through stage 4 chronic kidney disease, or unspecified chronic kidney disease: Secondary | ICD-10-CM | POA: Diagnosis not present

## 2023-10-18 DIAGNOSIS — N2581 Secondary hyperparathyroidism of renal origin: Secondary | ICD-10-CM | POA: Diagnosis not present

## 2023-10-18 DIAGNOSIS — I5032 Chronic diastolic (congestive) heart failure: Secondary | ICD-10-CM | POA: Diagnosis not present

## 2023-10-24 ENCOUNTER — Telehealth: Payer: Self-pay | Admitting: Nurse Practitioner

## 2023-10-24 MED ORDER — DEXCOM G7 SENSOR MISC: 1 refills | Status: DC

## 2023-10-24 NOTE — Telephone Encounter (Signed)
[facility] called and said that they are her DME provider with [facility] and they need a RX for her Dexcom G7 Supplies. Fax to [phone]

## 2023-10-24 NOTE — Telephone Encounter (Signed)
Rx faxed

## 2023-10-25 DIAGNOSIS — I5032 Chronic diastolic (congestive) heart failure: Secondary | ICD-10-CM | POA: Diagnosis not present

## 2023-10-25 DIAGNOSIS — R809 Proteinuria, unspecified: Secondary | ICD-10-CM | POA: Diagnosis not present

## 2023-10-25 DIAGNOSIS — E1122 Type 2 diabetes mellitus with diabetic chronic kidney disease: Secondary | ICD-10-CM | POA: Diagnosis not present

## 2023-10-25 DIAGNOSIS — E1129 Type 2 diabetes mellitus with other diabetic kidney complication: Secondary | ICD-10-CM | POA: Diagnosis not present

## 2023-10-25 NOTE — Telephone Encounter (Signed)
Noted  

## 2023-11-01 ENCOUNTER — Encounter: Payer: Self-pay | Admitting: Nurse Practitioner

## 2023-11-01 DIAGNOSIS — E1165 Type 2 diabetes mellitus with hyperglycemia: Secondary | ICD-10-CM | POA: Diagnosis not present

## 2023-11-01 LAB — HM DIABETES EYE EXAM

## 2023-11-02 ENCOUNTER — Other Ambulatory Visit: Payer: Self-pay | Admitting: Family Medicine

## 2023-11-02 ENCOUNTER — Other Ambulatory Visit: Payer: Self-pay

## 2023-11-02 DIAGNOSIS — E876 Hypokalemia: Secondary | ICD-10-CM

## 2023-11-02 MED ORDER — DILTIAZEM HCL ER BEADS 420 MG PO CP24: 420.0000 mg | ORAL_CAPSULE | Freq: Every day | ORAL | 0 refills | Status: DC

## 2023-11-05 DIAGNOSIS — N189 Chronic kidney disease, unspecified: Secondary | ICD-10-CM | POA: Diagnosis not present

## 2023-11-09 ENCOUNTER — Other Ambulatory Visit: Payer: Self-pay | Admitting: *Deleted

## 2023-11-09 DIAGNOSIS — Z7985 Long-term (current) use of injectable non-insulin antidiabetic drugs: Secondary | ICD-10-CM

## 2023-11-09 DIAGNOSIS — Z794 Long term (current) use of insulin: Secondary | ICD-10-CM

## 2023-11-09 MED ORDER — DEXCOM G7 SENSOR MISC: 1 refills | Status: DC

## 2023-11-21 ENCOUNTER — Ambulatory Visit (HOSPITAL_COMMUNITY)
Admission: RE | Admit: 2023-11-21 | Discharge: 2023-11-21 | Disposition: A | Discharge status: Home / Self Care | Payer: Medicare Other | Source: Ambulatory Visit | Attending: Vascular Surgery | Admitting: Vascular Surgery

## 2023-11-21 ENCOUNTER — Ambulatory Visit: Payer: Medicare Other | Admitting: Vascular Surgery

## 2023-11-21 ENCOUNTER — Encounter: Payer: Self-pay | Admitting: Vascular Surgery

## 2023-11-21 VITALS — BP 155/56 | HR 78 | Temp 98.3°F | Resp 20 | Ht 65.0 in | Wt 313.0 lb

## 2023-11-21 DIAGNOSIS — I872 Venous insufficiency (chronic) (peripheral): Secondary | ICD-10-CM | POA: Insufficient documentation

## 2023-11-21 DIAGNOSIS — R600 Localized edema: Secondary | ICD-10-CM
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
## 2023-12-05 ENCOUNTER — Telehealth: Payer: Self-pay | Admitting: Family Medicine

## 2023-12-05 ENCOUNTER — Other Ambulatory Visit: Payer: Self-pay

## 2023-12-05 DIAGNOSIS — Z780 Asymptomatic menopausal state: Secondary | ICD-10-CM

## 2023-12-05 NOTE — Telephone Encounter (Signed)
Pt has been informed.

## 2023-12-05 NOTE — Telephone Encounter (Signed)
Order placed , pt scheduled on 12/13/23 @1pm 

## 2023-12-05 NOTE — Telephone Encounter (Signed)
I spoke with the patient to schedule her AWV and she would like for someone to contact her about getting her Bone Density test scheduled.   Thank you,  Judeth Cornfield,  AMB Clinical Support [facility] AWV Program Direct Dial ??[phone]

## 2023-12-12 NOTE — Telephone Encounter (Signed)
Copied from CRM [id number]. Topic: Clinical - Request for Lab/Test Order >> Dec 12, 2023  2:02 PM Kendra Hanson wrote: Reason for CRM: She wants Dr. Lodema Hong to send lab orders for her appointment on February 11 at 10:40 AM to [facility] inside the office

## 2023-12-13 ENCOUNTER — Ambulatory Visit (HOSPITAL_COMMUNITY)
Admission: RE | Admit: 2023-12-13 | Discharge: 2023-12-13 | Disposition: A | Discharge status: Home / Self Care | Payer: Medicare Other | Source: Ambulatory Visit | Attending: Family Medicine | Admitting: Family Medicine

## 2023-12-13 DIAGNOSIS — M85852 Other specified disorders of bone density and structure, left thigh: Secondary | ICD-10-CM | POA: Diagnosis not present

## 2023-12-13 DIAGNOSIS — Z780 Asymptomatic menopausal state: Secondary | ICD-10-CM | POA: Diagnosis not present

## 2023-12-19 DIAGNOSIS — N1832 Chronic kidney disease, stage 3b: Secondary | ICD-10-CM | POA: Diagnosis not present

## 2023-12-19 DIAGNOSIS — I110 Hypertensive heart disease with heart failure: Secondary | ICD-10-CM | POA: Diagnosis not present

## 2023-12-19 DIAGNOSIS — E785 Hyperlipidemia, unspecified: Secondary | ICD-10-CM | POA: Diagnosis not present

## 2023-12-20 ENCOUNTER — Encounter: Payer: Self-pay | Admitting: Dietician

## 2023-12-20 ENCOUNTER — Encounter: Payer: Medicare Other | Attending: Nurse Practitioner | Admitting: Dietician

## 2023-12-20 DIAGNOSIS — Z794 Long term (current) use of insulin: Secondary | ICD-10-CM | POA: Diagnosis not present

## 2023-12-20 DIAGNOSIS — N183 Chronic kidney disease, stage 3 unspecified: Secondary | ICD-10-CM | POA: Insufficient documentation

## 2023-12-20 DIAGNOSIS — E1122 Type 2 diabetes mellitus with diabetic chronic kidney disease: Secondary | ICD-10-CM | POA: Insufficient documentation

## 2023-12-20 LAB — LIPID PANEL
Chol/HDL Ratio: 3.3 {ratio} (ref 0.0–4.4)
Cholesterol, Total: 215 mg/dL — ABNORMAL HIGH (ref 100–199)
HDL: 66 mg/dL (ref >39)
LDL Chol Calc (NIH): 131 mg/dL — ABNORMAL HIGH (ref 0–99)
Triglycerides: 103 mg/dL (ref 0–149)
VLDL Cholesterol Cal: 18 mg/dL (ref 5–40)

## 2023-12-20 LAB — CMP14+EGFR
ALT: 11 [IU]/L (ref 0–32)
AST: 16 [IU]/L (ref 0–40)
Albumin: 4.2 g/dL (ref 3.9–4.9)
Alkaline Phosphatase: 132 [IU]/L — ABNORMAL HIGH (ref 44–121)
BUN/Creatinine Ratio: 14 (ref 12–28)
BUN: 22 mg/dL (ref 8–27)
Bilirubin Total: 0.4 mg/dL (ref 0.0–1.2)
CO2: 22 mmol/L (ref 20–29)
Calcium: 9.3 mg/dL (ref 8.7–10.3)
Chloride: 104 mmol/L (ref 96–106)
Creatinine, Ser: 1.52 mg/dL — ABNORMAL HIGH (ref 0.57–1.00)
Globulin, Total: 2.5 g/dL (ref 1.5–4.5)
Glucose: 149 mg/dL — ABNORMAL HIGH (ref 70–99)
Potassium: 5.1 mmol/L (ref 3.5–5.2)
Sodium: 144 mmol/L (ref 134–144)
Total Protein: 6.7 g/dL (ref 6.0–8.5)
eGFR: 38 mL/min/{1.73_m2} — ABNORMAL LOW (ref >59)

## 2023-12-20 NOTE — Patient Instructions (Addendum)
 Small meals throughout the day -Breakfast, lunch, dinner Avoid skipping meals - this will help decrease snacking Decreased processed meat Avoid added salt Avoid/limit juice, regular soda, and other carbohydrate containing beverages - brew your own unsweetened tea and add Slenda or stevia as desired. Rethink what foods/snacks that you bring into the house.  Avoid seasonings with potassium in the ingredient list. Avoid foods with Phos... in the ingredient list.  Blood glucose goals:  80-130 fasting   100-180 two hours after starting any meal  Generally a rise of 40-60 points after a meal is normal  Low sodium options: [employer] Silver  palate low sodium pasta sauce   Reduce your sodium intake  Look at the %DV for sodium on the food label.  This should be less than 8% for most items.  Eat simple meals prepared at home most often.  Sample mea ideas:  Breakfast:    egg beaters (opt sauteed spinach, onions, peppers), whole wheat toast, fresh fruit Whole wheat toast with peanut butter, fresh fruit Greek yogurt, fresh fruit Oatmeal, fruit, boiled egg   Lunch/dinner:  1.  Baked/grilled chickens, small sweet potato or white potat0 with small amout margarine, steamed vegetables  2.  Homemade vegetable soup, open faced cheese toast (whole wheat and low sodium cheese)  3.  Low sodium tuna or chicken sandwich on whole wheat, fresh fruit  4.  Beans, brown rice, greens

## 2023-12-20 NOTE — Progress Notes (Signed)
 Medical Nutrition Therapy  Appointment Start time:  1120  Appointment End time: 1150. This is a phone visit.  Patient is in her home and I am in my office.  Our last phone visit was 09/17/2023.  Patient has questions regarding her current labs.  Cholesterol and LDL have decreased but remain elevated.  Primary concerns: She would like to learn more about nutrition that is good for diabetes and CKD Referral diagnosis: Type 2 diabetes and stage 3b CKD  Preferred learning style: no preference indicated Learning readiness: ready  NUTRITION ASSESSMENT  Anthropometrics  64 313 lbs 11/21/2023 302 lbs 09/20/2023 301 lbs 01/25/2023 306 lbs 12/24/2022 317 lbs 09/26/2022 328 lbs 08/22/2022 skipped her fluid pill yesterday 318 lbs 08/09/2022  UBW  Clinical Medical Hx: Type 2 Diabetes, CKD stage 2b, lymphedema, OSA c-pap (using this more 2/24), GERD, HTN, HLD, anemia, vitamin D  deficiency Medications/Supplements: 20 units q am and  HS Humulin  70/30 , Ozempic , torsemide , potassium, calcitriol, MVI, Repatha  Labs: A1C 7.8% 05/29/2023 increased from 6.5% 01/25/2023 BUN 24, Creatinine 1.33, Potassium 4.5 on 09/13/2023 and eGFR 23 03/02/2022 Lipid Panel     Component Value Date/Time   CHOL 215 (H) 12/19/2023 0957   TRIG 103 12/19/2023 0957   HDL 66 12/19/2023 0957   CHOLHDL 3.3 12/19/2023 0957   CHOLHDL 4.1 09/02/2019 0846   VLDL 15 04/12/2017 1121   LDLCALC 131 (H) 12/19/2023 0957   LDLCALC 131 (H) 09/02/2019 0846   LABVLDL 18 12/19/2023 0957   Lab Results  Component Value Date   CHOL 215 (H) 12/19/2023   CHOL 240 (H) 09/13/2023   CHOL 236 (H) 06/15/2023   Lab Results  Component Value Date   LDLCALC 131 (H) 12/19/2023   LDLCALC 158 (H) 09/13/2023   LDLCALC 145 (H) 06/15/2023   CGM:  Dexcom Notable Signs/Symptoms: swelling feet, fluid weight gain  Lifestyle & Dietary Hx Patient lives with her husband.  They share shopping and cooking.  She uses an engineer, petroleum at the store.  Eats out  3-4 times per week (asks for a to-go box at the beginning of the meal) She is on disability.  She used to work as an loss adjuster, chartered for 40 years. Avoids pork, uses I can't believe it's not butter, raw vegetables make her nauseas Has dentures and unable to tolerate hard food  Estimated daily fluid intake: 64 oz fluid restriction Sleep: 6-7 hours hours better with c-pap Stress / self-care: none Current average weekly physical activity: lymphedema and knee problems prohibit  24-Hr Dietary Recall First Meal:  Chicken sandwich  Snack:   Second Meal:  Chicken salad on toast, bean and bacon soup Snack:  [employer] cookies Third Meal:  Corned beef hash, potatoes and onions Snack:  Popcorn, chips, girlscout cookies Beverages: water , sweet tea, lemonade, Twist  Estimated Energy Needs Protein: 85-95 g  NUTRITION DIAGNOSIS  NB-1.1 Food and nutrition-related knowledge deficit As related to balance of carbohydrates, protein, and fat along with guidelines for CKD.  As evidenced by diet hx and patient report.  NUTRITION INTERVENTION  Nutrition education (E-1) on the following topics:  continued Reducing sodium and sugar Behavioral changes  Handouts Provided Include - previous visit [facility] national kidney diet - Dish up a Kidney-Friendly Meal for Patients with Chronic Kidney Disease (not on dialysis) Label reading [employer] of Lifestyle Medicine packet.  Learning Style & Readiness for Change Teaching method utilized: Visual & Auditory  Demonstrated degree of understanding via: Teach Back  Barriers to learning/adherence to  lifestyle change: health issues  Goals Small meals throughout the day -Breakfast, lunch, dinner Avoid skipping meals - this will help decrease snacking Decreased processed meat Avoid added salt Avoid/limit juice, regular soda, and other carbohydrate containing beverages - brew your own unsweetened tea and add Slenda or stevia as desired. Rethink what  foods/snacks that you bring into the house.  Avoid seasonings with potassium in the ingredient list. Avoid foods with Phos... in the ingredient list.  Blood glucose goals:  80-130 fasting   100-180 two hours after starting any meal  Generally a rise of 40-60 points after a meal is normal  Low sodium options: [employer] Silver  palate low sodium pasta sauce   Reduce your sodium intake  Look at the %DV for sodium on the food label.  This should be less than 8% for most items.  Eat simple meals prepared at home most often.  Sample mea ideas:  Breakfast:    egg beaters (opt sauteed spinach, onions, peppers), whole wheat toast, fresh fruit Whole wheat toast with peanut butter, fresh fruit Greek yogurt, fresh fruit Oatmeal, fruit, boiled egg   Lunch/dinner:  1.  Baked/grilled chickens, small sweet potato or white potat0 with small amout margarine, steamed vegetables  2.  Homemade vegetable soup, open faced cheese toast (whole wheat and low sodium cheese)  3.  Low sodium tuna or chicken sandwich on whole wheat, fresh fruit  4.  Beans, brown rice, greens  MONITORING & EVALUATION Dietary intake, weekly physical activity, and label reading in 4 months.  Next Steps  Patient is to call for questions.

## 2023-12-21 DIAGNOSIS — R809 Proteinuria, unspecified: Secondary | ICD-10-CM | POA: Diagnosis not present

## 2023-12-21 DIAGNOSIS — E1129 Type 2 diabetes mellitus with other diabetic kidney complication: Secondary | ICD-10-CM | POA: Diagnosis not present

## 2023-12-21 DIAGNOSIS — I5032 Chronic diastolic (congestive) heart failure: Secondary | ICD-10-CM | POA: Diagnosis not present

## 2023-12-21 DIAGNOSIS — E1122 Type 2 diabetes mellitus with diabetic chronic kidney disease: Secondary | ICD-10-CM | POA: Diagnosis not present

## 2023-12-25 ENCOUNTER — Encounter (INDEPENDENT_AMBULATORY_CARE_PROVIDER_SITE_OTHER): Payer: Self-pay | Admitting: *Deleted

## 2023-12-25 ENCOUNTER — Ambulatory Visit: Payer: Medicare Other | Admitting: Family Medicine

## 2023-12-25 ENCOUNTER — Encounter: Payer: Self-pay | Admitting: Family Medicine

## 2023-12-25 VITALS — BP 124/80 | HR 90 | Ht 65.0 in | Wt 317.1 lb

## 2023-12-25 DIAGNOSIS — E1122 Type 2 diabetes mellitus with diabetic chronic kidney disease: Secondary | ICD-10-CM

## 2023-12-25 DIAGNOSIS — E559 Vitamin D deficiency, unspecified: Secondary | ICD-10-CM

## 2023-12-25 DIAGNOSIS — I150 Renovascular hypertension: Secondary | ICD-10-CM

## 2023-12-25 DIAGNOSIS — M170 Bilateral primary osteoarthritis of knee: Secondary | ICD-10-CM

## 2023-12-25 DIAGNOSIS — Z1231 Encounter for screening mammogram for malignant neoplasm of breast: Secondary | ICD-10-CM

## 2023-12-25 DIAGNOSIS — N183 Chronic kidney disease, stage 3 unspecified: Secondary | ICD-10-CM | POA: Diagnosis not present

## 2023-12-25 DIAGNOSIS — Z794 Long term (current) use of insulin: Secondary | ICD-10-CM | POA: Diagnosis not present

## 2023-12-25 DIAGNOSIS — R52 Pain, unspecified: Secondary | ICD-10-CM

## 2023-12-25 DIAGNOSIS — K219 Gastro-esophageal reflux disease without esophagitis: Secondary | ICD-10-CM

## 2023-12-25 DIAGNOSIS — E1169 Type 2 diabetes mellitus with other specified complication: Secondary | ICD-10-CM

## 2023-12-25 DIAGNOSIS — E785 Hyperlipidemia, unspecified: Secondary | ICD-10-CM | POA: Diagnosis not present

## 2023-12-25 DIAGNOSIS — N1832 Chronic kidney disease, stage 3b: Secondary | ICD-10-CM | POA: Diagnosis not present

## 2023-12-25 DIAGNOSIS — M8580 Other specified disorders of bone density and structure, unspecified site: Secondary | ICD-10-CM | POA: Insufficient documentation

## 2023-12-25 DIAGNOSIS — I5032 Chronic diastolic (congestive) heart failure: Secondary | ICD-10-CM

## 2023-12-25 DIAGNOSIS — M8588 Other specified disorders of bone density and structure, other site: Secondary | ICD-10-CM

## 2023-12-25 MED ORDER — PREDNISONE 10 MG PO TABS: ORAL_TABLET | ORAL | 0 refills | Status: DC

## 2023-12-25 MED ORDER — METHYLPREDNISOLONE ACETATE 80 MG/ML IJ SUSP
40.0000 mg | Freq: Once | INTRAMUSCULAR | Status: AC
  Administered 2023-12-25: 40 mg via INTRAMUSCULAR

## 2023-12-25 NOTE — Assessment & Plan Note (Signed)
Increased pin and debiluity, overall

## 2023-12-25 NOTE — Assessment & Plan Note (Signed)
Updated lab needed.

## 2023-12-25 NOTE — Assessment & Plan Note (Addendum)
2 month h/o generalized [pain affecting every aspect of her health negatively from 8to 10 plus Refer rheumatology Stop statin for at least 2 weeks Add CK , ESR, RF Refer to rheumatology Depo m,edrol 40 mg IM in office and short course of prednisone 10 mg taper

## 2023-12-25 NOTE — Progress Notes (Signed)
[name]     MRN: [id number]      DOB: July 09, 1961  Chief Complaint  Patient presents with   Follow-up    All over body/joint pain concerned it could be cholesterol medication    HPI Kendra Hanson is here for follow up and re-evaluation of chronic medical conditions, medication management and review of any available recent lab and radiology data.    Generalized disabling pain x 2 months, thinks statin related, rated at 8  to 10 plus, disturbs even sleep  Nephrology recently recommended holding statin to see if beneficial which I agree with, cholesterol has improved  which she wants but mayu bhe intolerant of the med Denies polyuria, polydipsia, blurred vision , or hypoglycemic episodes. Blood sugar improving New dx of osteopenia , in 11/2023, attention to calcium and Vit D supplements this visit, will update Vit D level ROS Denies recent fever or chills. Denies sinus pressure, nasal congestion, ear pain or sore throat. Denies chest congestion, productive cough or wheezing. Denies chest pains, palpitations and leg swelling C/o burning substernal pain and states she was recently in [facility] with symptoms, started on gERD med still waiting on gI eval for same , denies dysphagia and reports some symptom improvement Denies dysuria, frequency, hesitancy or incontinence.  Denies headaches, seizures, numbness, or tingling.  Denies skin break down or rash.   PE  BP 124/80   Pulse 90   Ht 5\' 5"  (1.651 m)   Wt (!) 317 lb 1.3 oz (143.8 kg)   SpO2 90%   BMI 52.76 kg/m   Patient alert and oriented and in no cardiopulmonary distress.Pt uncomfortable and in pain  HEENT: No facial asymmetry, EOMI,     Neck supple .  Chest: Clear to auscultation bilaterally.  CVS: S1, S2 no murmurs, no S3.Regular rate.  ABD: Soft non tender.   Ext: Trace  edema  MS: decreased  ROM spine,  hips and knees.  Skin: Intact, no ulcerations or rash noted.  Psych: Good eye contact, normal affect.  Memory intact  anxious not  depressed appearing.  CNS: CN 2-12 intact, power,  normal throughout.no focal deficits noted.   Assessment & Plan  Gastroesophageal reflux disease Severe episode of GERD in 08/2023, reports seen in urgent care still needs GI appt  Generalized pain 2 month h/o generalized [pain affecting every aspect of her health negatively from 8to 10 plus Refer rheumatology Stop statin for at least 2 weeks Add CK , ESR, RF Refer to rheumatology Depo m,edrol 40 mg IM in office and short course of prednisone 10 mg taper  Hypertensive disorder Controlled, no change in medication DASH diet and commitment to daily physical activity for a minimum of 30 minutes discussed and encouraged, as a part of hypertension management. The importance of attaining a healthy weight is also discussed.     12/25/2023   11:15 AM 12/25/2023   10:26 AM 12/25/2023   10:23 AM 11/21/2023    1:49 PM 10/01/2023    2:38 PM 09/20/2023   11:21 AM 09/04/2023    9:14 AM  BP/Weight  Systolic BP 124 158 182 155 125 132 126  Diastolic BP 80 84 76 56 70 78 82  Wt. (Lbs)   317.08 313 300.2 302.12 306.8  BMI   52.76 kg/m2 52.09 kg/m2 49.96 kg/m2 50.28 kg/m2 51.05 kg/m2       Hyperlipidemia LDL goal <100 Hyperlipidemia:Low fat diet discussed and encouraged.   Lipid Panel  Lab Results  Component Value  Date   CHOL 215 (H) 12/19/2023   HDL 66 12/19/2023   LDLCALC 131 (H) 12/19/2023   TRIG 103 12/19/2023   CHOLHDL 3.3 12/19/2023     Improved but unclear if able to tolerate statin drug holiday started 3 days ago no improvement in pain as yet, 2 weeks recommended  (HFpEF) heart failure with preserved ejection fraction (HCC) Stable , had recent Cardiology eval No s/s of decompensation at visit  Stage 3b chronic kidney disease (HCC) Mild deterioration, being followed closely by [facility]  Vitamin D deficiency Updated lab needed    Morbid obesity [facility])  Patient re-educated about  the  importance of commitment to a  minimum of 150 minutes of exercise per week as able.  The importance of healthy food choices with portion control discussed, as well as eating regularly and within a 12 hour window most days. The need to choose "clean , green" food 50 to 75% of the time is discussed, as well as to make water the primary drink and set a goal of 64 ounces water daily.       12/25/2023   10:23 AM 11/21/2023    1:49 PM 10/01/2023    2:38 PM  Weight /BMI  Weight 317 lb 1.3 oz 313 lb 300 lb 3.2 oz  Height 5\' 5"  (1.651 m) 5\' 5"  (1.651 m) 5\' 5"  (1.651 m)  BMI 52.76 kg/m2 52.09 kg/m2 49.96 kg/m2    deteriorted  Type 2 diabetes mellitus with other specified complication (HCC) Managed by endo and has upcoming appointment  Diabetes associated with hypertension, hyperlipidemia, obesity, CKD, and arthritis  Ms. Rodier is reminded of the importance of commitment to daily physical activity for 30 minutes or more, as able and the need to limit carbohydrate intake to 30 to 60 grams per meal to help with blood sugar control.   The need to take medication as prescribed, test blood sugar as directed, and to call between visits if there is a concern that blood sugar is uncontrolled is also discussed.   Ms. Meller is reminded of the importance of daily foot exam, annual eye examination, and good blood sugar, blood pressure and cholesterol control.     Latest Ref Rng & Units 12/19/2023    9:57 AM 10/01/2023    3:05 PM 09/13/2023   11:42 AM 06/15/2023   11:04 AM 05/29/2023    1:49 PM  Diabetic Labs  HbA1c 4.0 - 5.6 %  7.6    7.8   Chol 100 - 199 mg/dL 098   119  147    HDL >82 mg/dL 66   64  76    Calc LDL 0 - 99 mg/dL 956   213  086    Triglycerides 0 - 149 mg/dL 578   469  86    Creatinine 0.57 - 1.00 mg/dL 6.29   5.28  4.13        12/25/2023   11:15 AM 12/25/2023   10:26 AM 12/25/2023   10:23 AM 11/21/2023    1:49 PM 10/01/2023    2:38 PM 09/20/2023   11:21 AM 09/04/2023    9:14 AM   BP/Weight  Systolic BP 124 158 182 155 125 132 126  Diastolic BP 80 84 76 56 70 78 82  Wt. (Lbs)   317.08 313 300.2 302.12 306.8  BMI   52.76 kg/m2 52.09 kg/m2 49.96 kg/m2 50.28 kg/m2 51.05 kg/m2      10/01/2023    2:30 PM 09/26/2022  3:30 PM  Foot/eye exam completion dates  Foot Form Completion Done Done        Osteoarthritis of both knees Increased pin and debiluity, overall

## 2023-12-25 NOTE — Assessment & Plan Note (Signed)
Managed by endo and has upcoming appointment  Diabetes associated with hypertension, hyperlipidemia, obesity, CKD, and arthritis  Kendra Hanson is reminded of the importance of commitment to daily physical activity for 30 minutes or more, as able and the need to limit carbohydrate intake to 30 to 60 grams per meal to help with blood sugar control.   The need to take medication as prescribed, test blood sugar as directed, and to call between visits if there is a concern that blood sugar is uncontrolled is also discussed.   Kendra Hanson is reminded of the importance of daily foot exam, annual eye examination, and good blood sugar, blood pressure and cholesterol control.     Latest Ref Rng & Units 12/19/2023    9:57 AM 10/01/2023    3:05 PM 09/13/2023   11:42 AM 06/15/2023   11:04 AM 05/29/2023    1:49 PM  Diabetic Labs  HbA1c 4.0 - 5.6 %  7.6    7.8   Chol 100 - 199 mg/dL 409   811  914    HDL >78 mg/dL 66   64  76    Calc LDL 0 - 99 mg/dL 295   621  308    Triglycerides 0 - 149 mg/dL 657   846  86    Creatinine 0.57 - 1.00 mg/dL 9.62   9.52  8.41        12/25/2023   11:15 AM 12/25/2023   10:26 AM 12/25/2023   10:23 AM 11/21/2023    1:49 PM 10/01/2023    2:38 PM 09/20/2023   11:21 AM 09/04/2023    9:14 AM  BP/Weight  Systolic BP 124 158 182 155 125 132 126  Diastolic BP 80 84 76 56 70 78 82  Wt. (Lbs)   317.08 313 300.2 302.12 306.8  BMI   52.76 kg/m2 52.09 kg/m2 49.96 kg/m2 50.28 kg/m2 51.05 kg/m2      10/01/2023    2:30 PM 09/26/2022    3:30 PM  Foot/eye exam completion dates  Foot Form Completion Done Done

## 2023-12-25 NOTE — Assessment & Plan Note (Addendum)
Severe episode of GERD in 08/2023, reports seen in urgent care still needs GI appt

## 2023-12-25 NOTE — Assessment & Plan Note (Signed)
Hyperlipidemia:Low fat diet discussed and encouraged.   Lipid Panel  Lab Results  Component Value Date   CHOL 215 (H) 12/19/2023   HDL 66 12/19/2023   LDLCALC 131 (H) 12/19/2023   TRIG 103 12/19/2023   CHOLHDL 3.3 12/19/2023     Improved but unclear if able to tolerate statin drug holiday started 3 days ago no improvement in pain as yet, 2 weeks recommended

## 2023-12-25 NOTE — Assessment & Plan Note (Signed)
Stable , had recent Cardiology eval No s/s of decompensation at visit

## 2023-12-25 NOTE — Patient Instructions (Addendum)
F/U in 12 to 14 weeks , call if you need me sooner  Please schedule mammogram at checkout  Nurse pls add if able vit D level, CK level and Magnesium level and rheumatoid factor and ESR pt to draw lab today if unable to add Bopnes are thinning, osteopenia, calcium 1000 mg daily is recommended  Urine aCR today  Nurse pls send for recent eye exam  Both covid and RSV vaccines are recommended,get them separtely next month    Depo medrol 40 mg IM in office for pain and taper of predniosne 10 mg as written on med bottle You are referred to rheumatoilogy  You are being prescribed vi tD supplement by nephrology  Stop crestor x 2 week, see if this helps with the pain  Thanks for choosing [facility], we consider it a privelige to serve you.

## 2023-12-25 NOTE — Assessment & Plan Note (Signed)
  Patient re-educated about  the importance of commitment to a  minimum of 150 minutes of exercise per week as able.  The importance of healthy food choices with portion control discussed, as well as eating regularly and within a 12 hour window most days. The need to choose "clean , green" food 50 to 75% of the time is discussed, as well as to make water the primary drink and set a goal of 64 ounces water daily.       12/25/2023   10:23 AM 11/21/2023    1:49 PM 10/01/2023    2:38 PM  Weight /BMI  Weight 317 lb 1.3 oz 313 lb 300 lb 3.2 oz  Height 5\' 5"  (1.651 m) 5\' 5"  (1.651 m) 5\' 5"  (1.651 m)  BMI 52.76 kg/m2 52.09 kg/m2 49.96 kg/m2    deteriorted

## 2023-12-25 NOTE — Assessment & Plan Note (Signed)
Controlled, no change in medication DASH diet and commitment to daily physical activity for a minimum of 30 minutes discussed and encouraged, as a part of hypertension management. The importance of attaining a healthy weight is also discussed.     12/25/2023   11:15 AM 12/25/2023   10:26 AM 12/25/2023   10:23 AM 11/21/2023    1:49 PM 10/01/2023    2:38 PM 09/20/2023   11:21 AM 09/04/2023    9:14 AM  BP/Weight  Systolic BP 124 158 182 155 125 132 126  Diastolic BP 80 84 76 56 70 78 82  Wt. (Lbs)   317.08 313 300.2 302.12 306.8  BMI   52.76 kg/m2 52.09 kg/m2 49.96 kg/m2 50.28 kg/m2 51.05 kg/m2

## 2023-12-25 NOTE — Assessment & Plan Note (Signed)
Mild deterioration, being followed closely by [facility]

## 2023-12-26 ENCOUNTER — Other Ambulatory Visit: Payer: Self-pay

## 2023-12-26 ENCOUNTER — Encounter: Payer: Self-pay | Admitting: Family Medicine

## 2023-12-26 DIAGNOSIS — N1832 Chronic kidney disease, stage 3b: Secondary | ICD-10-CM

## 2023-12-26 DIAGNOSIS — N183 Chronic kidney disease, stage 3 unspecified: Secondary | ICD-10-CM

## 2023-12-26 LAB — VITAMIN D 25 HYDROXY (VIT D DEFICIENCY, FRACTURES): Vit D, 25-Hydroxy: 34.4 ng/mL (ref 30.0–100.0)

## 2023-12-26 LAB — CK: Total CK: 219 U/L — ABNORMAL HIGH (ref 32–182)

## 2023-12-26 LAB — RHEUMATOID FACTOR: Rheumatoid fact SerPl-aCnc: <10 [IU]/mL (ref <14.0)

## 2023-12-26 LAB — SEDIMENTATION RATE: Sed Rate: 13 mm/h (ref 0–40)

## 2023-12-26 LAB — MAGNESIUM: Magnesium: 2.1 mg/dL (ref 1.6–2.3)

## 2023-12-26 NOTE — Telephone Encounter (Signed)
Labs ordered.

## 2023-12-27 LAB — MICROALBUMIN / CREATININE URINE RATIO
Creatinine, Urine: 23.6 mg/dL
Microalb/Creat Ratio: 78 mg/g{creat} — ABNORMAL HIGH (ref 0–29)
Microalbumin, Urine: 18.4 ug/mL

## 2024-01-04 ENCOUNTER — Telehealth: Payer: Self-pay | Admitting: Nurse Practitioner

## 2024-01-04 NOTE — Telephone Encounter (Signed)
Pt wants to know where she gets sensors from so she can call and get one replaced that didn't work.

## 2024-01-04 NOTE — Telephone Encounter (Signed)
She can call [employer] and they will send replacement.  Not the supplier.

## 2024-01-04 NOTE — Telephone Encounter (Signed)
 Called and let pt know

## 2024-01-08 ENCOUNTER — Encounter: Payer: Self-pay | Admitting: Nurse Practitioner

## 2024-01-08 ENCOUNTER — Ambulatory Visit (INDEPENDENT_AMBULATORY_CARE_PROVIDER_SITE_OTHER): Payer: Medicare Other | Admitting: Nurse Practitioner

## 2024-01-08 VITALS — BP 120/62 | HR 109 | Ht 65.0 in | Wt 311.0 lb

## 2024-01-08 DIAGNOSIS — N1832 Chronic kidney disease, stage 3b: Secondary | ICD-10-CM

## 2024-01-08 DIAGNOSIS — Z794 Long term (current) use of insulin: Secondary | ICD-10-CM | POA: Diagnosis not present

## 2024-01-08 DIAGNOSIS — Z7985 Long-term (current) use of injectable non-insulin antidiabetic drugs: Secondary | ICD-10-CM | POA: Diagnosis not present

## 2024-01-08 DIAGNOSIS — E1122 Type 2 diabetes mellitus with diabetic chronic kidney disease: Secondary | ICD-10-CM

## 2024-01-08 DIAGNOSIS — R5383 Other fatigue: Secondary | ICD-10-CM

## 2024-01-08 DIAGNOSIS — I10 Essential (primary) hypertension: Secondary | ICD-10-CM | POA: Diagnosis not present

## 2024-01-08 DIAGNOSIS — E559 Vitamin D deficiency, unspecified: Secondary | ICD-10-CM

## 2024-01-08 DIAGNOSIS — E782 Mixed hyperlipidemia: Secondary | ICD-10-CM | POA: Diagnosis not present

## 2024-01-08 DIAGNOSIS — M6281 Muscle weakness (generalized): Secondary | ICD-10-CM | POA: Diagnosis not present

## 2024-01-08 LAB — POCT GLYCOSYLATED HEMOGLOBIN (HGB A1C): Hemoglobin A1C: 7.8 % — AB (ref 4.0–5.6)
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
## 2024-01-09 ENCOUNTER — Encounter: Payer: Self-pay | Admitting: Nurse Practitioner

## 2024-01-09 DIAGNOSIS — E1122 Type 2 diabetes mellitus with diabetic chronic kidney disease: Secondary | ICD-10-CM

## 2024-01-09 LAB — T4, FREE: Free T4: 1.34 ng/dL (ref 0.82–1.77)

## 2024-01-09 LAB — THYROGLOBULIN ANTIBODY: Thyroglobulin Antibody: <1 [IU]/mL (ref 0.0–0.9)

## 2024-01-09 LAB — T3, FREE: T3, Free: 2.7 pg/mL (ref 2.0–4.4)

## 2024-01-09 LAB — TSH: TSH: 1.59 u[IU]/mL (ref 0.450–4.500)

## 2024-01-09 LAB — THYROID PEROXIDASE ANTIBODY: Thyroperoxidase Ab SerPl-aCnc: 12 [IU]/mL (ref 0–34)

## 2024-01-09 MED ORDER — NOVOLOG MIX 70/30 FLEXPEN (70-30) 100 UNIT/ML ~~LOC~~ SUPN: 35.0000 [IU] | PEN_INJECTOR | Freq: Two times a day (BID) | SUBCUTANEOUS | 0 refills | Status: DC

## 2024-01-09 MED ORDER — SEMAGLUTIDE (2 MG/DOSE) 8 MG/3ML ~~LOC~~ SOPN: 2.0000 mg | PEN_INJECTOR | SUBCUTANEOUS | 0 refills | Status: DC

## 2024-01-09 NOTE — Progress Notes (Signed)
 FYI: I sent mychart message going over thyroid labs.

## 2024-01-24 ENCOUNTER — Other Ambulatory Visit: Payer: Self-pay | Admitting: Family Medicine

## 2024-01-24 ENCOUNTER — Encounter (INDEPENDENT_AMBULATORY_CARE_PROVIDER_SITE_OTHER): Payer: Self-pay | Admitting: Gastroenterology

## 2024-01-24 ENCOUNTER — Ambulatory Visit (INDEPENDENT_AMBULATORY_CARE_PROVIDER_SITE_OTHER): Payer: Medicare Other | Admitting: Gastroenterology

## 2024-01-24 VITALS — BP 128/77 | HR 76 | Temp 97.1°F | Ht 65.0 in | Wt 312.0 lb

## 2024-01-24 DIAGNOSIS — E1122 Type 2 diabetes mellitus with diabetic chronic kidney disease: Secondary | ICD-10-CM | POA: Diagnosis not present

## 2024-01-24 DIAGNOSIS — N183 Chronic kidney disease, stage 3 unspecified: Secondary | ICD-10-CM | POA: Diagnosis not present

## 2024-01-24 DIAGNOSIS — R140 Abdominal distension (gaseous): Secondary | ICD-10-CM | POA: Insufficient documentation

## 2024-01-24 DIAGNOSIS — Z794 Long term (current) use of insulin: Secondary | ICD-10-CM | POA: Diagnosis not present

## 2024-01-24 DIAGNOSIS — K219 Gastro-esophageal reflux disease without esophagitis: Secondary | ICD-10-CM

## 2024-01-24 DIAGNOSIS — N1832 Chronic kidney disease, stage 3b: Secondary | ICD-10-CM | POA: Diagnosis not present
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
## 2024-01-24 NOTE — Patient Instructions (Addendum)
 Stop pantoprazole Continue famotidine 40 mg BID Avoid restarting Cresto, if possible

## 2024-01-25 ENCOUNTER — Encounter: Payer: Self-pay | Admitting: Family Medicine

## 2024-01-25 LAB — BMP8+EGFR
BUN/Creatinine Ratio: 17 (ref 12–28)
BUN: 28 mg/dL — ABNORMAL HIGH (ref 8–27)
CO2: 22 mmol/L (ref 20–29)
Calcium: 9.2 mg/dL (ref 8.7–10.3)
Chloride: 102 mmol/L (ref 96–106)
Creatinine, Ser: 1.65 mg/dL — ABNORMAL HIGH (ref 0.57–1.00)
Glucose: 180 mg/dL — ABNORMAL HIGH (ref 70–99)
Potassium: 4.6 mmol/L (ref 3.5–5.2)
Sodium: 140 mmol/L (ref 134–144)
eGFR: 35 mL/min/{1.73_m2} — ABNORMAL LOW (ref >59)

## 2024-01-25 LAB — CK: Total CK: 146 U/L (ref 32–182)

## 2024-01-28 ENCOUNTER — Ambulatory Visit (HOSPITAL_COMMUNITY)
Admission: RE | Admit: 2024-01-28 | Discharge: 2024-01-28 | Disposition: A | Discharge status: Home / Self Care | Payer: Medicare Other | Source: Ambulatory Visit | Attending: Family Medicine | Admitting: Family Medicine

## 2024-01-28 DIAGNOSIS — Z1231 Encounter for screening mammogram for malignant neoplasm of breast: Secondary | ICD-10-CM | POA: Diagnosis not present

## 2024-01-29 ENCOUNTER — Ambulatory Visit (INDEPENDENT_AMBULATORY_CARE_PROVIDER_SITE_OTHER): Payer: Medicare Other

## 2024-01-29 VITALS — BP 120/76 | Ht 65.0 in | Wt 312.0 lb

## 2024-01-29 DIAGNOSIS — Z0000 Encounter for general adult medical examination without abnormal findings: Secondary | ICD-10-CM | POA: Diagnosis not present

## 2024-01-29 LAB — HM DIABETES EYE EXAM

## 2024-01-29 NOTE — Patient Instructions (Signed)
 Kendra Hanson , Thank you for taking time to come for your Medicare Wellness Visit. I appreciate your ongoing commitment to your health goals. Please review the following plan we discussed and let me know if I can assist you in the future.   Referrals/Orders/Follow-Ups/Clinician Recommendations:  Next Medicare Annual Wellness Visit:   February 02, 2025 at 1:10 pm video visit  This is a list of the screening recommended for you and due dates:  Health Maintenance  Topic Date Due   Eye exam for diabetics  06/09/2023   COVID-19 Vaccine (4 - 2024-25 season) 07/15/2023   Hemoglobin A1C  07/07/2024   Complete foot exam   09/30/2024   Yearly kidney health urinalysis for diabetes  12/24/2024   Yearly kidney function blood test for diabetes  01/23/2025   Mammogram  01/27/2025   Medicare Annual Wellness Visit  01/28/2025   Pneumococcal Vaccination (4 of 4 - PPSV23 or PCV20) 02/15/2025   Colon Cancer Screening  04/04/2025   DEXA scan (bone density measurement)  12/12/2025   Pap with HPV screening  03/08/2026   DTaP/Tdap/Td vaccine (3 - Td or Tdap) 06/19/2033   Flu Shot  Completed   Hepatitis C Screening  Completed   HIV Screening  Completed   Zoster (Shingles) Vaccine  Completed   HPV Vaccine  Aged Out    Advanced directives: (Declined) Advance directive discussed with you today. Even though you declined this today, please call our office should you change your mind, and we can give you the proper paperwork for you to fill out.  Next Medicare Annual Wellness Visit scheduled for next year: yes  Understanding Your Risk for Falls Millions of people have serious injuries from falls each year. It is important to understand your risk of falling. Talk with your health care provider about your risk and what you can do to lower it. If you do have a serious fall, make sure to tell your provider. Falling once raises your risk of falling again. How can falls affect me? Serious injuries from falls are  common. These include: Broken bones, such as hip fractures. Head injuries, such as traumatic brain injuries (TBI) or concussions. A fear of falling can cause you to avoid activities and stay at home. This can make your muscles weaker and raise your risk for a fall. What can increase my risk? There are a number of risk factors that increase your risk for falling. The more risk factors you have, the higher your risk of falling. Serious injuries from a fall happen most often to people who are older than 64 years old. Teenagers and young adults ages 28-29 are also at higher risk. Common risk factors include: Weakness in the lower body. Being generally weak or confused due to long-term (chronic) illness. Dizziness or balance problems. Poor vision. Medicines that cause dizziness or drowsiness. These may include: Medicines for your blood pressure, heart, anxiety, insomnia, or swelling (edema). Pain medicines. Muscle relaxants. Other risk factors include: Drinking alcohol. Having had a fall in the past. Having foot pain or wearing improper footwear. Working at a dangerous job. Having any of the following in your home: Tripping hazards, such as floor clutter or loose rugs. Poor lighting. Pets. Having dementia or memory loss. What actions can I take to lower my risk of falling?     Physical activity Stay physically fit. Do strength and balance exercises. Consider taking a regular class to build strength and balance. Yoga and tai chi are good options. Vision Have  your eyes checked every year and your prescription for glasses or contacts updated as needed. Shoes and walking aids Wear non-skid shoes. Wear shoes that have rubber soles and low heels. Do not wear high heels. Do not walk around the house in socks or slippers. Use a cane or walker as told by your provider. Home safety Attach secure railings on both sides of your stairs. Install grab bars for your bathtub, shower, and toilet.  Use a non-skid mat in your bathtub or shower. Attach bath mats securely with double-sided, non-slip rug tape. Use good lighting in all rooms. Keep a flashlight near your bed. Make sure there is a clear path from your bed to the bathroom. Use night-lights. Do not use throw rugs. Make sure all carpeting is taped or tacked down securely. Remove all clutter from walkways and stairways, including extension cords. Repair uneven or broken steps and floors. Avoid walking on icy or slippery surfaces. Walk on the grass instead of on icy or slick sidewalks. Use ice melter to get rid of ice on walkways in the winter. Use a cordless phone. Questions to ask your health care provider Can you help me check my risk for a fall? Do any of my medicines make me more likely to fall? Should I take a vitamin D supplement? What exercises can I do to improve my strength and balance? Should I make an appointment to have my vision checked? Do I need a bone density test to check for weak bones (osteoporosis)? Would it help to use a cane or a walker? Where to find more information Centers for Disease Control and Prevention, STEAD[employer]: TonerPromos.no Community-Based Fall Prevention Programs: TonerPromos.no [employer] on Aging: BaseRingTones.pl Contact a health care provider if: You fall at home. You are afraid of falling at home. You feel weak, drowsy, or dizzy. This information is not intended to replace advice given to you by your health care provider. Make sure you discuss any questions you have with your health care provider. Document Revised: 07/03/2022 Document Reviewed: 07/03/2022 Elsevier Patient Education  2024 [employer].
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
## 2024-01-30 ENCOUNTER — Encounter: Payer: Self-pay | Admitting: Family Medicine

## 2024-02-04 ENCOUNTER — Encounter: Payer: Self-pay | Admitting: Nurse Practitioner

## 2024-02-04 ENCOUNTER — Other Ambulatory Visit: Payer: Self-pay | Admitting: Nurse Practitioner

## 2024-02-04 DIAGNOSIS — Z7985 Long-term (current) use of injectable non-insulin antidiabetic drugs: Secondary | ICD-10-CM

## 2024-02-04 DIAGNOSIS — Z794 Long term (current) use of insulin: Secondary | ICD-10-CM

## 2024-02-04 DIAGNOSIS — N1832 Chronic kidney disease, stage 3b: Secondary | ICD-10-CM

## 2024-02-04 MED ORDER — DEXCOM G7 SENSOR MISC: 1 refills | Status: DC

## 2024-02-06 ENCOUNTER — Telehealth: Payer: Self-pay | Admitting: Family Medicine

## 2024-02-06 NOTE — Telephone Encounter (Signed)
 [employer] Forms Noted Copied Scanned Original in provider box Copy at front desk

## 2024-02-07 ENCOUNTER — Other Ambulatory Visit: Payer: Self-pay | Admitting: Family Medicine

## 2024-02-14 ENCOUNTER — Encounter: Payer: Medicare Other | Attending: Nurse Practitioner | Admitting: Dietician

## 2024-02-14 DIAGNOSIS — Z794 Long term (current) use of insulin: Secondary | ICD-10-CM | POA: Insufficient documentation

## 2024-02-14 DIAGNOSIS — N183 Chronic kidney disease, stage 3 unspecified: Secondary | ICD-10-CM | POA: Insufficient documentation

## 2024-02-14 DIAGNOSIS — E1122 Type 2 diabetes mellitus with diabetic chronic kidney disease: Secondary | ICD-10-CM | POA: Diagnosis not present

## 2024-02-14 NOTE — Patient Instructions (Signed)
 Small meals throughout the day -Breakfast, lunch, dinner Avoid skipping meals - this will help decrease snacking Avoid processed meat Avoid added salt Continue to drink water and beverages without sugar - adequate hydration Continue to rethink what foods/snacks that you bring into the house. - great job on changes made  Avoid seasonings with potassium in the ingredient list. Avoid foods with Phos... in the ingredient list.  Blood glucose goals:  80-130 fasting   100-180 two hours after starting any meal  Generally a rise of 40-60 points after a meal is normal  Low sodium options: [employer] palate low sodium pasta sauce   Reduce your sodium intake  Look at the %DV for sodium on the food label.  This should be less than 8% for most items.  Eat simple meals prepared at home most often.  Sample mea ideas:  Breakfast:    egg beaters (opt sauteed spinach, onions, peppers), whole wheat toast, fresh fruit Whole wheat toast with peanut butter, fresh fruit [place] yogurt, fresh fruit Oatmeal, fruit, boiled egg   Lunch/dinner:  1.  Baked/grilled chickens, small sweet potato or white potato with small amout margarine, steamed vegetables  2.  Homemade vegetable soup, open faced cheese toast (whole wheat and low sodium cheese)  3.  Low sodium tuna or chicken sandwich on whole wheat, fresh fruit  4.  Beans, brown rice, greens  Eat slowly and enjoy.

## 2024-02-14 NOTE — Progress Notes (Signed)
 Medical Nutrition Therapy  Appointment Start time:  1040  Appointment End time: 1100. This is a phone visit.  Patient is in her home and I am in my office.  Our last phone visit was 02/14/2024.  She is now using the Dexcom G7.  She cannot give me data as she needs to start a new sensor. Sensor reading 171 before bed and fasting blood glucose around 30. Eating out less. Armchair exercises.    Primary concerns: She would like to learn more about nutrition that is good for diabetes and CKD Referral diagnosis: Type 2 diabetes and stage 3b CKD  Preferred learning style: no preference indicated Learning readiness: ready  NUTRITION ASSESSMENT  Anthropometrics  65" 312 lbs 01/29/2024 313 lbs 11/21/2023 302 lbs 09/20/2023 301 lbs 01/25/2023 306 lbs 12/24/2022 317 lbs 09/26/2022 328 lbs 08/22/2022 skipped her fluid pill yesterday 318 lbs 08/09/2022  UBW  Clinical Medical Hx: Type 2 Diabetes, CKD stage 2b, lymphedema, OSA c-pap (using this more 2/24), GERD, HTN, HLD, anemia, vitamin D deficiency Medications/Supplements: 20 units q am and  HS Humulin 70/30 , Ozempic, torsemide, potassium, calcitriol, MVI, Repatha Labs: A1C 7.8% 01/08/24 and 7.6% 11/198/2024 BUN 28, Creatinine 1.65, Potassium 4.6 on 09/13/2023 and eGFR 35 01/26/2024 Lipid Panel     Component Value Date/Time   CHOL 215 (H) 12/19/2023 0957   TRIG 103 12/19/2023 0957   HDL 66 12/19/2023 0957   CHOLHDL 3.3 12/19/2023 0957   CHOLHDL 4.1 09/02/2019 0846   VLDL 15 04/12/2017 1121   LDLCALC 131 (H) 12/19/2023 0957   LDLCALC 131 (H) 09/02/2019 0846   LABVLDL 18 12/19/2023 0957   Lab Results  Component Value Date   CHOL 215 (H) 12/19/2023   CHOL 240 (H) 09/13/2023   CHOL 236 (H) 06/15/2023   Lab Results  Component Value Date   LDLCALC 131 (H) 12/19/2023   LDLCALC 158 (H) 09/13/2023   LDLCALC 145 (H) 06/15/2023   CGM:  Dexcom Notable Signs/Symptoms: swelling feet, fluid weight gain  Lifestyle & Dietary Hx Patient lives  with her husband.  They share shopping and cooking.  She uses an Engineer, petroleum at the store.  Eats out 3-4 times per week (asks for a to-go box at the beginning of the meal) She is on disability.  She used to work as an Loss adjuster, chartered for 40 years. Avoids pork, uses I can't believe it's not butter, raw vegetables make her nauseas Has dentures and unable to tolerate hard food  Estimated daily fluid intake: 64 oz fluid restriction Sleep: 6-7 hours hours - no longer wearing c-pap Stress / self-care: none Current average weekly physical activity: lymphedema and knee problems prohibit - doing some chair exercises  24-Hr Dietary Recall First Meal:  boiled egg, nutrigrain bar Snack:  none Second Meal:  broccoli chicken casserole, cabbage Snack:  pineapple bites Third Meal:  [place] stew and crackers Snack:  canned peaches in water Beverages: water, sugar free Twist  Estimated Energy Needs Protein: 85-95 g  NUTRITION DIAGNOSIS  NB-1.1 Food and nutrition-related knowledge deficit As related to balance of carbohydrates, protein, and fat along with guidelines for CKD.  As evidenced by diet hx and patient report.  NUTRITION INTERVENTION  Nutrition education (E-1) on the following topics:  continued Discussed kidney function and nutrition, continued low sodium diet, less animal protein Portion sizes Behavioral changes  Handouts Provided Include - previous visit [facility] national kidney diet - Dish up a Kidney-Friendly Meal for Patients with Chronic Kidney Disease (not on dialysis)  Label reading [employer] of Lifestyle Medicine packet.  Learning Style & Readiness for Change Teaching method utilized: Visual & Auditory  Demonstrated degree of understanding via: Teach Back  Barriers to learning/adherence to lifestyle change: health issues  Goals  MONITORING & EVALUATION Dietary intake, weekly physical activity, and label reading in 4 months.  Next Steps  Patient is to call for  questions.

## 2024-02-19 ENCOUNTER — Other Ambulatory Visit: Payer: Self-pay | Admitting: Family Medicine

## 2024-02-19 DIAGNOSIS — E876 Hypokalemia: Secondary | ICD-10-CM

## 2024-02-20 NOTE — Telephone Encounter (Signed)
Patient's husband picked up RX

## 2024-02-21 ENCOUNTER — Encounter: Payer: Self-pay | Admitting: Family Medicine

## 2024-02-24 ENCOUNTER — Encounter: Payer: Self-pay | Admitting: Family Medicine

## 2024-02-24 ENCOUNTER — Telehealth: Admitting: Nurse Practitioner

## 2024-02-24 DIAGNOSIS — J029 Acute pharyngitis, unspecified: Secondary | ICD-10-CM

## 2024-02-24 MED ORDER — IPRATROPIUM BROMIDE 0.03 % NA SOLN: 2.0000 | Freq: Two times a day (BID) | NASAL | 0 refills | Status: DC

## 2024-02-24 NOTE — Progress Notes (Signed)
 E-Visit for Sore Throat  We are sorry that you are not feeling well.  Here is how we plan to help!  Your symptoms indicate a likely viral infection (Pharyngitis).   Pharyngitis is inflammation in the back of the throat which can cause a sore throat, scratchiness and sometimes difficulty swallowing.   Pharyngitis is typically caused by a respiratory virus and will just run its course.  Please keep in mind that your symptoms could last up to 10 days.  For throat pain, we recommend over the counter oral pain relief medications such as acetaminophen or aspirin, THROAT COAT tea,   or anti-inflammatory medications such as ibuprofen or naproxen sodium.  Topical treatments such as oral throat lozenges or sprays may be used as needed.  Avoid close contact with loved ones, especially the very young and elderly.  Remember to wash your hands thoroughly throughout the day as this is the number one way to prevent the spread of infection and wipe down door knobs and counters with disinfectant.  After careful review of your answers, I would not recommend an antibiotic for your condition.  Antibiotics should not be used to treat conditions that we suspect are caused by viruses like the virus that causes the common cold or flu. However, some people can have Strep with atypical symptoms. You may need formal testing in clinic or office to confirm if your symptoms continue or worsen  I have sent a steroid nasal spray for your symptoms at this time. I also recommend gentle nasal saline irrigation with a sinus rinse over the counter kit.   Providers prescribe antibiotics to treat infections caused by bacteria. Antibiotics are very powerful in treating bacterial infections when they are used properly.  To maintain their effectiveness, they should be used only when necessary.  Overuse of antibiotics has resulted in the development of super bugs that are resistant to treatment!    Home Care: Only take medications as instructed  by your medical team. Do not drink alcohol while taking these medications. A steam or ultrasonic humidifier can help congestion.  You can place a towel over your head and breathe in the steam from hot water coming from a faucet. Avoid close contacts especially the very young and the elderly. Cover your mouth when you cough or sneeze. Always remember to wash your hands.  Get Help Right Away If: You develop worsening fever or throat pain. You develop a severe head ache or visual changes. Your symptoms persist after you have completed your treatment plan.  Make sure you Understand these instructions. Will watch your condition. Will get help right away if you are not doing well or get worse.   Thank you for choosing an e-visit.  Your e-visit answers were reviewed by a board certified advanced clinical practitioner to complete your personal care plan. Depending upon the condition, your plan could have included both over the counter or prescription medications.  Please review your pharmacy choice. Make sure the pharmacy is open so you can pick up prescription now. If there is a problem, you may contact your provider through [employer] and have the prescription routed to another pharmacy.  Your safety is important to us . If you have drug allergies check your prescription carefully.   For the next 24 hours you can use MyChart to ask questions about today's visit, request a non-urgent call back, or ask for a work or school excuse. You will get an email in the next two days asking about your  experience. I hope that your e-visit has been valuable and will speed your recovery.

## 2024-02-24 NOTE — Progress Notes (Signed)
 I have spent 5 minutes in review of e-visit questionnaire, review and updating patient chart, medical decision making and response to patient.   Claiborne Rigg, NP

## 2024-02-26 ENCOUNTER — Ambulatory Visit
Admission: EM | Admit: 2024-02-26 | Discharge: 2024-02-26 | Disposition: A | Discharge status: Home / Self Care | Attending: Family Medicine | Admitting: Family Medicine

## 2024-02-26 ENCOUNTER — Other Ambulatory Visit: Payer: Self-pay | Admitting: Nurse Practitioner

## 2024-02-26 ENCOUNTER — Encounter: Payer: Self-pay | Admitting: Nurse Practitioner

## 2024-02-26 DIAGNOSIS — Z794 Long term (current) use of insulin: Secondary | ICD-10-CM

## 2024-02-26 DIAGNOSIS — J208 Acute bronchitis due to other specified organisms: Secondary | ICD-10-CM | POA: Diagnosis not present

## 2024-02-26 DIAGNOSIS — R062 Wheezing: Secondary | ICD-10-CM

## 2024-02-26 DIAGNOSIS — J4521 Mild intermittent asthma with (acute) exacerbation: Secondary | ICD-10-CM

## 2024-02-26 LAB — POC COVID19/FLU A&B COMBO
Covid Antigen, POC: NEGATIVE
Influenza A Antigen, POC: NEGATIVE
Influenza B Antigen, POC: NEGATIVE

## 2024-02-26 MED ORDER — PREDNISONE 20 MG PO TABS: 40.0000 mg | ORAL_TABLET | Freq: Every day | ORAL | 0 refills | Status: DC

## 2024-02-26 MED ORDER — PROMETHAZINE-DM 6.25-15 MG/5ML PO SYRP
5.0000 mL | ORAL_SOLUTION | Freq: Four times a day (QID) | ORAL | 0 refills | Status: DC | PRN
Start: 2024-02-26 — End: 2024-03-11

## 2024-02-26 MED ORDER — ALBUTEROL SULFATE HFA 108 (90 BASE) MCG/ACT IN AERS: 2.0000 | INHALATION_SPRAY | RESPIRATORY_TRACT | 0 refills | Status: AC | PRN
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
## 2024-02-26 NOTE — Telephone Encounter (Signed)
 scheduled

## 2024-02-26 NOTE — Discharge Instructions (Signed)
 In addition to the prescribed medications, you may continue your allergy regimen, nasal spray, saline sinus rinses, and if desired Mucinex.  Follow-up for worsening or unresolving symptoms.

## 2024-02-26 NOTE — ED Triage Notes (Signed)
 Pt reports she has a cough, sinus pressure, runny nose, and side pain x 3 days

## 2024-02-27 ENCOUNTER — Telehealth

## 2024-02-28 NOTE — Telephone Encounter (Signed)
 It looks like Joy sent this in on April 15, to the [place].

## 2024-03-05 ENCOUNTER — Ambulatory Visit (INDEPENDENT_AMBULATORY_CARE_PROVIDER_SITE_OTHER): Payer: Medicare Other

## 2024-03-05 VITALS — Ht 65.0 in | Wt 310.0 lb

## 2024-03-05 DIAGNOSIS — Z0000 Encounter for general adult medical examination without abnormal findings: Secondary | ICD-10-CM
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
## 2024-03-05 NOTE — Patient Instructions (Addendum)
 [name] , Thank you for taking time to come for your Medicare Wellness Visit. I appreciate your ongoing commitment to your health goals. Please review the following plan we discussed and let me know if I can assist you in the future.   Referrals/Orders/Follow-Ups/Clinician Recommendations:  Next Medicare AWV: March 10, 2025 at 12:30 pm video visit.   Aim for 30 minutes of exercise or brisk walking, 6-8 glasses of water , and 5 servings of fruits and vegetables each day.  Here are some resources for home safety modifications to make your home safer for you.   Resource Website Printmaker https://baptistsonmission.org/Mission-Projects/Local-Ideas/Construction-Projects/Wheelchair-Ramp-Construction  Wheelchair Ramp and Guard Rails  [place]  [employer] [facility] (Kendra Hanson) https://www.ball.org/  [place] for Shower  [employer] http://www.gogreenplumb.com/Contact-Us   [place], Kendra Hanson, or [place], [place] - plumbing assistance for those who cannot afford it. They assist 1 household per month. Nominate at website link.   [employer] RentalMaids.dk  safety repairs, grab bars  [facility]dependent [facility]/ [place] and [employer] Kendra Hanson Enter [phone]  Home modifications so people can remain at home.  $8000 cap     This is a list of the screening recommended for you and due dates:  Health Maintenance  Topic Date Due   Pneumococcal Vaccination (4 of 4 - PCV20 or PCV21) 03/28/2021   COVID-19 Vaccine (4 - 2024-25 season) 07/15/2023   Flu Shot  06/13/2024   Hemoglobin A1C  07/07/2024   Complete foot exam   09/30/2024   Yearly kidney health urinalysis for diabetes  12/24/2024   Yearly kidney function blood test for diabetes  01/23/2025   Mammogram  01/27/2025   Eye exam for diabetics  01/28/2025   Medicare Annual Wellness Visit  03/05/2025   Colon Cancer Screening  04/04/2025   DEXA scan (bone density  measurement)  12/12/2025   Pap with HPV screening  03/08/2026   DTaP/Tdap/Td vaccine (3 - Td or Tdap) 06/19/2033   Hepatitis C Screening  Completed   HIV Screening  Completed   Zoster (Shingles) Vaccine  Completed   HPV Vaccine  Aged Out   Meningitis B Vaccine  Aged Out    Advanced directives: (Declined) Advance directive discussed with you today. Even though you declined this today, please call our office should you change your mind, and we can give you the proper paperwork for you to fill out. Advance Care Planning is important because it:  [x]  Makes sure you receive the medical care that is consistent with your values, goals, and preferences  [x]  It provides guidance to your family and loved ones and it also reduces their decisional burden about whether or not they are making the right decisions based on what you want done  Follow the link provided in your after visit summary or read over the paperwork we have mailed to you to help you started getting your Advance Directives in place. If you need assistance in completing these, please reach out to us  so that we can help you!   Next Medicare Annual Wellness Visit scheduled for next year: yes  Understanding Your Risk for Falls Millions of people have serious injuries from falls each year. It is important to understand your risk of falling. Talk with your health care provider about your risk and what you can do to lower it. If you do have a serious fall, make sure to tell your provider. Falling once raises your risk of falling again. How can falls affect me? Serious injuries from  falls are common. These include: Broken bones, such as hip fractures. Head injuries, such as traumatic brain injuries (TBI) or concussions. A fear of falling can cause you to avoid activities and stay at home. This can make your muscles weaker and raise your risk for a fall. What can increase my risk? There are a number of risk factors that increase your risk  for falling. The more risk factors you have, the higher your risk of falling. Serious injuries from a fall happen most often to people who are older than 64 years old. Teenagers and young adults ages 52-29 are also at higher risk. Common risk factors include: Weakness in the lower body. Being generally weak or confused due to long-term (chronic) illness. Dizziness or balance problems. Poor vision. Medicines that cause dizziness or drowsiness. These may include: Medicines for your blood pressure, heart, anxiety, insomnia, or swelling (edema). Pain medicines. Muscle relaxants. Other risk factors include: Drinking alcohol. Having had a fall in the past. Having foot pain or wearing improper footwear. Working at a dangerous job. Having any of the following in your home: Tripping hazards, such as floor clutter or loose rugs. Poor lighting. Pets. Having dementia or memory loss. What actions can I take to lower my risk of falling?     Physical activity Stay physically fit. Do strength and balance exercises. Consider taking a regular class to build strength and balance. Yoga and tai chi are good options. Vision Have your eyes checked every year and your prescription for glasses or contacts updated as needed. Shoes and walking aids Wear non-skid shoes. Wear shoes that have rubber soles and low heels. Do not wear high heels. Do not walk around the house in socks or slippers. Use a cane or walker as told by your provider. Home safety Attach secure railings on both sides of your stairs. Install grab bars for your bathtub, shower, and toilet. Use a non-skid mat in your bathtub or shower. Attach bath mats securely with double-sided, non-slip rug tape. Use good lighting in all rooms. Keep a flashlight near your bed. Make sure there is a clear path from your bed to the bathroom. Use night-lights. Do not use throw rugs. Make sure all carpeting is taped or tacked down securely. Remove all  clutter from walkways and stairways, including extension cords. Repair uneven or broken steps and floors. Avoid walking on icy or slippery surfaces. Walk on the grass instead of on icy or slick sidewalks. Use ice melter to get rid of ice on walkways in the winter. Use a cordless phone. Questions to ask your health care provider Can you help me check my risk for a fall? Do any of my medicines make me more likely to fall? Should I take a vitamin D  supplement? What exercises can I do to improve my strength and balance? Should I make an appointment to have my vision checked? Do I need a bone density test to check for weak bones (osteoporosis)? Would it help to use a cane or a walker? Where to find more information Centers for Disease Control and Prevention, STEADI: TonerPromos.no Community-Based Fall Prevention Programs: TonerPromos.no [employer] on Aging: BaseRingTones.pl Contact a health care provider if: You fall at home. You are afraid of falling at home. You feel weak, drowsy, or dizzy. This information is not intended to replace advice given to you by your health care provider. Make sure you discuss any questions you have with your health care provider. Document Revised: 07/03/2022 Document Reviewed: 07/03/2022 Elsevier  Patient Education  2024 [employer].

## 2024-03-11 ENCOUNTER — Encounter: Payer: Self-pay | Admitting: Family Medicine

## 2024-03-11 ENCOUNTER — Ambulatory Visit (HOSPITAL_COMMUNITY)
Admission: RE | Admit: 2024-03-11 | Discharge: 2024-03-11 | Disposition: A | Discharge status: Home / Self Care | Source: Ambulatory Visit | Attending: Family Medicine | Admitting: Family Medicine

## 2024-03-11 ENCOUNTER — Ambulatory Visit (INDEPENDENT_AMBULATORY_CARE_PROVIDER_SITE_OTHER): Admitting: Family Medicine

## 2024-03-11 VITALS — BP 149/76 | HR 102 | Resp 18 | Ht 65.0 in | Wt 317.1 lb

## 2024-03-11 DIAGNOSIS — J309 Allergic rhinitis, unspecified: Secondary | ICD-10-CM

## 2024-03-11 DIAGNOSIS — I110 Hypertensive heart disease with heart failure: Secondary | ICD-10-CM | POA: Diagnosis not present

## 2024-03-11 DIAGNOSIS — J302 Other seasonal allergic rhinitis: Secondary | ICD-10-CM | POA: Diagnosis not present

## 2024-03-11 DIAGNOSIS — R051 Acute cough: Secondary | ICD-10-CM | POA: Diagnosis not present

## 2024-03-11 DIAGNOSIS — E1169 Type 2 diabetes mellitus with other specified complication: Secondary | ICD-10-CM | POA: Diagnosis not present

## 2024-03-11 DIAGNOSIS — R059 Cough, unspecified: Secondary | ICD-10-CM | POA: Diagnosis not present

## 2024-03-11 DIAGNOSIS — Z794 Long term (current) use of insulin: Secondary | ICD-10-CM

## 2024-03-11 MED ORDER — AZITHROMYCIN 250 MG PO TABS: ORAL_TABLET | ORAL | 0 refills | Status: AC

## 2024-03-11 MED ORDER — PROMETHAZINE-DM 6.25-15 MG/5ML PO SYRP: ORAL_SOLUTION | ORAL | 0 refills | Status: DC

## 2024-03-11 MED ORDER — LORATADINE 10 MG PO TABS
ORAL_TABLET | ORAL | 5 refills | Status: AC
Start: 2024-03-11 — End: ?

## 2024-03-11 MED ORDER — BENZONATATE 200 MG PO CAPS: 200.0000 mg | ORAL_CAPSULE | Freq: Two times a day (BID) | ORAL | 0 refills | Status: DC | PRN

## 2024-03-11 MED ORDER — MONTELUKAST SODIUM 10 MG PO TABS: 10.0000 mg | ORAL_TABLET | Freq: Every day | ORAL | 3 refills | Status: AC

## 2024-03-11 NOTE — Patient Instructions (Addendum)
 Follow-up as before, call if you need me sooner.    Please get chest x-ray today.  Tessalon  Perles and azithromycin  are prescribed for  treatment of bronchitis.phenergan  DM is refilled  Commit to twice daily loratadine  for allergy control as well as once daily montelukast .  Continue to use and Flonase  daily as prescribed.  Continue and complete the course of your additional allergy nasal spray, ipratropium,  until done.  You may need to be referred back to the allergist if you continue to have the symptoms you present with today so very important that you keep follow-up appointment as well as send and sooner if you have ongoing uncontrolled symptoms.  Thanks for choosing [facility], we consider it a privelige to serve you.

## 2024-03-19 ENCOUNTER — Ambulatory Visit: Payer: Self-pay | Admitting: Family Medicine

## 2024-03-21 DIAGNOSIS — R809 Proteinuria, unspecified: Secondary | ICD-10-CM | POA: Diagnosis not present

## 2024-03-21 DIAGNOSIS — N189 Chronic kidney disease, unspecified: Secondary | ICD-10-CM | POA: Diagnosis not present

## 2024-03-21 DIAGNOSIS — E211 Secondary hyperparathyroidism, not elsewhere classified: Secondary | ICD-10-CM | POA: Diagnosis not present

## 2024-03-21 DIAGNOSIS — D631 Anemia in chronic kidney disease: Secondary | ICD-10-CM | POA: Diagnosis not present

## 2024-03-24 ENCOUNTER — Encounter: Payer: Self-pay | Admitting: Family Medicine

## 2024-03-24 DIAGNOSIS — R051 Acute cough: Secondary | ICD-10-CM | POA: Insufficient documentation

## 2024-03-24 NOTE — Assessment & Plan Note (Signed)
 cXR, Z pack and phenergan  DM if cough persits refer back to allergist

## 2024-03-24 NOTE — Assessment & Plan Note (Signed)
 Uncontrolled with chronic nasal drainage and cough

## 2024-03-24 NOTE — Progress Notes (Signed)
 Kendra Kendra Hanson     MRN: [id number]      DOB: Feb 11, 1961  Chief Complaint  Patient presents with   Cough    Complains of productive cough, sinus congestion, and drainage x2 weeks. Was seen at [facility] on 04/17 and was prescribed inhaler, nasal spray, steroid, and taking otc medication but no improvement. Denies fever.     HPI [name] is here with above concerns  ROS Denies chest pains, palpitations and leg swelling Denies abdominal pain, nausea, vomiting,diarrhea or constipation.   Denies dysuria, frequency, hesitancy or incontinence. Chronic  joint pain, swelling and limitation in mobility. Denies headaches, seizures, numbness, or tingling. Denies depression, anxiety or insomnia. Denies skin break down or rash.   PE  BP (!) 149/76   Pulse (!) 102   Resp 18   Ht 5\' 5"  (1.651 m)   Wt (!) 317 lb 1.3 oz (143.8 kg)   SpO2 98%   BMI 52.76 kg/m   Patient alert and oriented excess cough during visit.  HEENT: No facial asymmetry, EOMI,     Neck supple .  Chest: decreased air entry no wheezes heard or crackles CVS: S1, S2 no murmurs, no S3.Regular rate.  ABD: Soft non tender.   Ext: No edema  MS: decreased  ROM spine, shoulders, hips and knees.  Skin: Intact, no ulcerations or rash noted.  Psych: Good eye contact, normal affect. Memory intact not anxious or depressed appearing.  CNS: CN 2-12 intact, power,  normal throughout.no focal deficits noted.   Assessment & Plan  Acute cough cXR, Z pack and phenergan  DM if cough persits refer back to allergist  Other seasonal allergic rhinitis Currently uncontrolled and triggering chronic cough, takes meds sporadically , commitment to daily use of all allergy medicationsstressed if remains symptomatic will need Allergy eval  Seasonal allergies Currently experiencing uncontrolled symptoms , likely due to non adherence to medication regime  Allergic sinusitis Uncontrolled with chronic nasal drainage and  cough  Hypertensive heart disease with heart failure (HCC) UnControlled, no change in medication, blood pressure elevated however on multiple meds and decongestants in recent past and currently for cough DASH diet and commitment to daily physical activity for a minimum of 30 minutes discussed and encouraged, as a part of hypertension management. The importance of attaining a healthy weight is also discussed.     03/11/2024    2:22 PM 03/05/2024   12:34 PM 02/26/2024    5:15 PM 01/29/2024   12:57 PM 01/24/2024    2:02 PM 01/08/2024    3:14 PM 12/25/2023   11:15 AM  BP/Weight  Systolic BP 149 -- 158 120 128 120 124  Diastolic BP 76 -- 74 76 77 62 80  Wt. (Lbs) 317.08 310  312 312 311   BMI 52.76 kg/m2 51.59 kg/m2  51.92 kg/m2 51.92 kg/m2 51.75 kg/m2        Morbid obesity (HCC)  Patient re-educated about  the importance of commitment to a  minimum of 150 minutes of exercise per week as able.  The importance of healthy food choices with portion control discussed, as well as eating regularly and within a 12 hour window most days. The need to choose "clean , green" food 50 to 75% of the time is discussed, as well as to make water  the primary drink and set a goal of 64 ounces water  daily.       03/11/2024    2:22 PM 03/05/2024   12:34 PM 01/29/2024   12:57  PM  Weight /BMI  Weight 317 lb 1.3 oz 310 lb 312 lb  Height 5\' 5"  (1.651 m) 5\' 5"  (1.651 m) 5\' 5"  (1.651 m)  BMI 52.76 kg/m2 51.59 kg/m2 51.92 kg/m2      Type 2 diabetes mellitus with other specified complication (HCC) Diabetes associated with hypertension, hyperlipidemia, obesity, and arthritis  Ms. Kendra Kendra Hanson is reminded of the importance of commitment to daily physical activity for 30 minutes or more, as able and the need to limit carbohydrate intake to 30 to 60 grams per meal to help with blood sugar control.   The need to take medication as prescribed, test blood sugar as directed, and to call between visits if there is a  concern that blood sugar is uncontrolled is also discussed.   [name] is reminded of the importance of daily foot exam, annual eye examination, and good blood sugar, blood pressure and cholesterol control.     Latest Ref Rng & Units 01/24/2024    3:18 PM 01/08/2024    3:29 PM 12/25/2023   11:30 AM 12/19/2023    9:57 AM 10/01/2023    3:05 PM  Diabetic Labs  HbA1c 4.0 - 5.6 %  7.8    7.6   Micro/Creat Ratio 0 - 29 mg/g creat   78     Chol 100 - 199 mg/dL    782    HDL >95 mg/dL    66    Calc LDL 0 - 99 mg/dL    621    Triglycerides 0 - 149 mg/dL    308    Creatinine 6.57 - 1.00 mg/dL 8.46    9.62        9/52/8413    2:22 PM 03/05/2024   12:34 PM 02/26/2024    5:15 PM 01/29/2024   12:57 PM 01/24/2024    2:02 PM 01/08/2024    3:14 PM 12/25/2023   11:15 AM  BP/Weight  Systolic BP 149 -- 158 120 128 120 124  Diastolic BP 76 -- 74 76 77 62 80  Wt. (Lbs) 317.08 310  312 312 311   BMI 52.76 kg/m2 51.59 kg/m2  51.92 kg/m2 51.92 kg/m2 51.75 kg/m2       Latest Ref Rng & Units 01/29/2024   12:00 AM 11/01/2023    2:41 PM  Foot/eye exam completion dates  Eye Exam No Retinopathy No Retinopathy     Retinopathy         This result is from an external source.      Managed by Endo

## 2024-03-24 NOTE — Assessment & Plan Note (Signed)
 Currently experiencing uncontrolled symptoms , likely due to non adherence to medication regime

## 2024-03-24 NOTE — Assessment & Plan Note (Signed)
  Patient re-educated about  the importance of commitment to a  minimum of 150 minutes of exercise per week as able.  The importance of healthy food choices with portion control discussed, as well as eating regularly and within a 12 hour window most days. The need to choose "clean , green" food 50 to 75% of the time is discussed, as well as to make water  the primary drink and set a goal of 64 ounces water  daily.       03/11/2024    2:22 PM 03/05/2024   12:34 PM 01/29/2024   12:57 PM  Weight /BMI  Weight 317 lb 1.3 oz 310 lb 312 lb  Height 5\' 5"  (1.651 m) 5\' 5"  (1.651 m) 5\' 5"  (1.651 m)  BMI 52.76 kg/m2 51.59 kg/m2 51.92 kg/m2

## 2024-03-24 NOTE — Assessment & Plan Note (Addendum)
 UnControlled, no change in medication, blood pressure elevated however on multiple meds and decongestants in recent past and currently for cough DASH diet and commitment to daily physical activity for a minimum of 30 minutes discussed and encouraged, as a part of hypertension management. The importance of attaining a healthy weight is also discussed.     03/11/2024    2:22 PM 03/05/2024   12:34 PM 02/26/2024    5:15 PM 01/29/2024   12:57 PM 01/24/2024    2:02 PM 01/08/2024    3:14 PM 12/25/2023   11:15 AM  BP/Weight  Systolic BP 149 -- 158 120 128 120 124  Diastolic BP 76 -- 74 76 77 62 80  Wt. (Lbs) 317.08 310  312 312 311   BMI 52.76 kg/m2 51.59 kg/m2  51.92 kg/m2 51.92 kg/m2 51.75 kg/m2

## 2024-03-24 NOTE — Assessment & Plan Note (Signed)
 Diabetes associated with hypertension, hyperlipidemia, obesity, and arthritis  Kendra Hanson is reminded of the importance of commitment to daily physical activity for 30 minutes or more, as able and the need to limit carbohydrate intake to 30 to 60 grams per meal to help with blood sugar control.   The need to take medication as prescribed, test blood sugar as directed, and to call between visits if there is a concern that blood sugar is uncontrolled is also discussed.   Kendra Hanson is reminded of the importance of daily foot exam, annual eye examination, and good blood sugar, blood pressure and cholesterol control.     Latest Ref Rng & Units 01/24/2024    3:18 PM 01/08/2024    3:29 PM 12/25/2023   11:30 AM 12/19/2023    9:57 AM 10/01/2023    3:05 PM  Diabetic Labs  HbA1c 4.0 - 5.6 %  7.8    7.6   Micro/Creat Ratio 0 - 29 mg/g creat   78     Chol 100 - 199 mg/dL    409    HDL >81 mg/dL    66    Calc LDL 0 - 99 mg/dL    191    Triglycerides 0 - 149 mg/dL    478    Creatinine 2.95 - 1.00 mg/dL 6.21    3.08        6/57/8469    2:22 PM 03/05/2024   12:34 PM 02/26/2024    5:15 PM 01/29/2024   12:57 PM 01/24/2024    2:02 PM 01/08/2024    3:14 PM 12/25/2023   11:15 AM  BP/Weight  Systolic BP 149 -- 158 120 128 120 124  Diastolic BP 76 -- 74 76 77 62 80  Wt. (Lbs) 317.08 310  312 312 311   BMI 52.76 kg/m2 51.59 kg/m2  51.92 kg/m2 51.92 kg/m2 51.75 kg/m2       Latest Ref Rng & Units 01/29/2024   12:00 AM 11/01/2023    2:41 PM  Foot/eye exam completion dates  Eye Exam No Retinopathy No Retinopathy     Retinopathy         This result is from an external source.      Managed by Endo

## 2024-03-24 NOTE — Assessment & Plan Note (Signed)
 Currently uncontrolled and triggering chronic cough, takes meds sporadically , commitment to daily use of all allergy medicationsstressed if remains symptomatic will need Allergy eval

## 2024-03-27 DIAGNOSIS — R809 Proteinuria, unspecified: Secondary | ICD-10-CM | POA: Diagnosis not present

## 2024-03-27 DIAGNOSIS — E1122 Type 2 diabetes mellitus with diabetic chronic kidney disease: Secondary | ICD-10-CM | POA: Diagnosis not present

## 2024-03-27 DIAGNOSIS — I5032 Chronic diastolic (congestive) heart failure: Secondary | ICD-10-CM | POA: Diagnosis not present

## 2024-03-27 DIAGNOSIS — E1129 Type 2 diabetes mellitus with other diabetic kidney complication: Secondary | ICD-10-CM | POA: Diagnosis not present

## 2024-03-27 LAB — LAB REPORT - SCANNED
A1c: 7.8
Creatinine, POC: 165.6 mg/dL
EGFR: 48
PTH: 124

## 2024-04-03 ENCOUNTER — Telehealth: Payer: Self-pay | Admitting: *Deleted

## 2024-04-03 ENCOUNTER — Encounter: Payer: Self-pay | Admitting: Nurse Practitioner

## 2024-04-03 NOTE — Telephone Encounter (Signed)
 A updated prescription and recent office notes have been faxed to [facility]. For Dexcom G7.

## 2024-04-09 DIAGNOSIS — E1165 Type 2 diabetes mellitus with hyperglycemia: Secondary | ICD-10-CM | POA: Diagnosis not present

## 2024-04-11 ENCOUNTER — Telehealth: Payer: Self-pay | Admitting: Family Medicine

## 2024-04-11 ENCOUNTER — Ambulatory Visit

## 2024-04-11 ENCOUNTER — Encounter: Payer: Self-pay | Admitting: Family Medicine

## 2024-04-11 VITALS — BP 144/82 | HR 76 | Resp 18 | Wt 307.0 lb

## 2024-04-11 DIAGNOSIS — E876 Hypokalemia: Secondary | ICD-10-CM | POA: Diagnosis not present

## 2024-04-11 DIAGNOSIS — Z794 Long term (current) use of insulin: Secondary | ICD-10-CM

## 2024-04-11 DIAGNOSIS — R051 Acute cough: Secondary | ICD-10-CM | POA: Diagnosis not present

## 2024-04-11 DIAGNOSIS — E1169 Type 2 diabetes mellitus with other specified complication: Secondary | ICD-10-CM | POA: Diagnosis not present

## 2024-04-11 DIAGNOSIS — Z23 Encounter for immunization: Secondary | ICD-10-CM

## 2024-04-11 DIAGNOSIS — E785 Hyperlipidemia, unspecified: Secondary | ICD-10-CM

## 2024-04-11 DIAGNOSIS — I150 Renovascular hypertension: Secondary | ICD-10-CM | POA: Diagnosis not present

## 2024-04-11 DIAGNOSIS — N1832 Chronic kidney disease, stage 3b: Secondary | ICD-10-CM | POA: Diagnosis not present

## 2024-04-11 MED ORDER — DILTIAZEM HCL ER BEADS 420 MG PO CP24: 420.0000 mg | ORAL_CAPSULE | Freq: Every day | ORAL | 3 refills | Status: AC

## 2024-04-11 MED ORDER — FAMOTIDINE 40 MG PO TABS: 40.0000 mg | ORAL_TABLET | Freq: Two times a day (BID) | ORAL | 3 refills | Status: AC

## 2024-04-11 NOTE — Patient Instructions (Addendum)
 F/U in 4 months, call if you  need me sooner   Pt to come today for pneumonia 20 vaccine  I recommend annual exam with pelvic exam with Gyne this  year  Colonoscopy is due  in 2026  Pls order for pt to get fasting lipid panel next week and give her the order when she comes  Pls send for recent labs from nephrology , Dr  Carrolyn Clan if you cannot find them in epic  It is important that you exercise regularly at least 30 minutes 5 times a week. If you develop chest pain, have severe difficulty breathing, or feel very tired, stop exercising immediately and seek medical attention   Think about what you will eat, plan ahead. Choose " clean, green, fresh or frozen" over canned, processed or packaged foods which are more sugary, salty and fatty. 70 to 75% of food eaten should be vegetables and fruit. Three meals at set times with snacks allowed between meals, but they must be fruit or vegetables. Aim to eat over a 12 hour period , example 7 am to 7 pm, and STOP after  your last meal of the day. Drink water ,generally about 64 ounces per day, no other drink is as healthy. Fruit juice is best enjoyed in a healthy way, by EATING the fruit. .Thanks for choosing [facility], we consider it a privelige to serve you.

## 2024-04-14 ENCOUNTER — Encounter: Payer: Self-pay | Admitting: Family Medicine

## 2024-04-14 DIAGNOSIS — E876 Hypokalemia: Secondary | ICD-10-CM | POA: Insufficient documentation

## 2024-04-14 DIAGNOSIS — Z23 Encounter for immunization: Secondary | ICD-10-CM | POA: Insufficient documentation

## 2024-04-14 DIAGNOSIS — E785 Hyperlipidemia, unspecified: Secondary | ICD-10-CM | POA: Diagnosis not present

## 2024-04-14 NOTE — Assessment & Plan Note (Addendum)
 Diabetes associated with hypertension, hyperlipidemia, obesity, CKD, and arthritis  Kendra Hanson is reminded of the importance of commitment to daily physical activity for 30 minutes or more, as able and the need to limit carbohydrate intake to 30 to 60 grams per meal to help with blood sugar control.  Managed by Gyne, not sat goal but mproving The need to take medication as prescribed, test blood sugar as directed, and to call between visits if there is a concern that blood sugar is uncontrolled is also discussed.   Kendra Hanson is reminded of the importance of daily foot exam, annual eye examination, and good blood sugar, blood pressure and cholesterol control.     Latest Ref Rng & Units 01/24/2024    3:18 PM 01/08/2024    3:29 PM 12/25/2023   11:30 AM 12/19/2023    9:57 AM 10/01/2023    3:05 PM  Diabetic Labs  HbA1c 4.0 - 5.6 %  7.8    7.6   Micro/Creat Ratio 0 - 29 mg/g creat   78     Chol 100 - 199 mg/dL    161    HDL >09 mg/dL    66    Calc LDL 0 - 99 mg/dL    604    Triglycerides 0 - 149 mg/dL    540    Creatinine 9.81 - 1.00 mg/dL 1.91    4.78        2/95/6213    3:07 PM 03/11/2024    2:22 PM 03/05/2024   12:34 PM 02/26/2024    5:15 PM 01/29/2024   12:57 PM 01/24/2024    2:02 PM 01/08/2024    3:14 PM  BP/Weight  Systolic BP 144 149 -- 158 120 128 120  Diastolic BP 82 76 -- 74 76 77 62  Wt. (Lbs) 307.04 317.08 310  312 312 311  BMI 51.09 kg/m2 52.76 kg/m2 51.59 kg/m2  51.92 kg/m2 51.92 kg/m2 51.75 kg/m2      Latest Ref Rng & Units 01/29/2024   12:00 AM 11/01/2023    2:41 PM  Foot/eye exam completion dates  Eye Exam No Retinopathy No Retinopathy     Retinopathy         This result is from an external source.

## 2024-04-14 NOTE — Assessment & Plan Note (Signed)
 After obtaining informed consent, the pneumonia 20  vaccine is  administered , with no adverse effect noted at the time of administration.

## 2024-04-14 NOTE — Assessment & Plan Note (Signed)
Managed by Nephrology, stable °

## 2024-04-14 NOTE — Progress Notes (Signed)
 Virtual Visit via Video Note  I connected with Kendra Hanson on 04/11/2024 at 1:40 PM by a video enabled telemedicine application and verified that I am speaking with the correct person using two identifiers.  Location: Patient: home Provider: office   I discussed the limitations of evaluation and management by telemedicine and the availability of in person appointments. The patient expressed understanding and agreed to proceed.  History of Present Illness: Follow up chronic problems and med management   Observations/Objective: BP (!) 144/82   Pulse 76   Resp 18   Wt (!) 307 lb 0.6 oz (139.3 kg)   SpO2 95%   BMI 51.09 kg/m  Good communication with no confusion and intact memory. Alert and oriented x 3 No signs of respiratory distress during speech   Assessment and Plan:  Acute cough Resolved with treatment  Stage 3b chronic kidney disease (HCC) Managed by Nephrology, stable  Type 2 diabetes mellitus with other specified complication (HCC) Diabetes associated with hypertension, hyperlipidemia, obesity, CKD, and arthritis  Kendra Hanson is reminded of the importance of commitment to daily physical activity for 30 minutes or more, as able and the need to limit carbohydrate intake to 30 to 60 grams per meal to help with blood sugar control.  Managed by Gyne, not sat goal but mproving The need to take medication as prescribed, test blood sugar as directed, and to call between visits if there is a concern that blood sugar is uncontrolled is also discussed.   Kendra Hanson is reminded of the importance of daily foot exam, annual eye examination, and good blood sugar, blood pressure and cholesterol control.     Latest Ref Rng & Units 01/24/2024    3:18 PM 01/08/2024    3:29 PM 12/25/2023   11:30 AM 12/19/2023    9:57 AM 10/01/2023    3:05 PM  Diabetic Labs  HbA1c 4.0 - 5.6 %  7.8    7.6   Micro/Creat Ratio 0 - 29 mg/g creat   78     Chol 100 - 199 mg/dL    161    HDL >09  mg/dL    66    Calc LDL 0 - 99 mg/dL    604    Triglycerides 0 - 149 mg/dL    540    Creatinine 9.81 - 1.00 mg/dL 1.91    4.78        2/95/6213    3:07 PM 03/11/2024    2:22 PM 03/05/2024   12:34 PM 02/26/2024    5:15 PM 01/29/2024   12:57 PM 01/24/2024    2:02 PM 01/08/2024    3:14 PM  BP/Weight  Systolic BP 144 149 -- 158 120 128 120  Diastolic BP 82 76 -- 74 76 77 62  Wt. (Lbs) 307.04 317.08 310  312 312 311  BMI 51.09 kg/m2 52.76 kg/m2 51.59 kg/m2  51.92 kg/m2 51.92 kg/m2 51.75 kg/m2      Latest Ref Rng & Units 01/29/2024   12:00 AM 11/01/2023    2:41 PM  Foot/eye exam completion dates  Eye Exam No Retinopathy No Retinopathy     Retinopathy         This result is from an external source.        Hypertensive disorder DASH diet and commitment to daily physical activity for a minimum of 30 minutes discussed and encouraged, as a part of hypertension management. The importance of attaining a healthy weight is also discussed.  04/11/2024    3:07 PM 03/11/2024    2:22 PM 03/05/2024   12:34 PM 02/26/2024    5:15 PM 01/29/2024   12:57 PM 01/24/2024    2:02 PM 01/08/2024    3:14 PM  BP/Weight  Systolic BP 144 149 -- 158 120 128 120  Diastolic BP 82 76 -- 74 76 77 62  Wt. (Lbs) 307.04 317.08 310  312 312 311  BMI 51.09 kg/m2 52.76 kg/m2 51.59 kg/m2  51.92 kg/m2 51.92 kg/m2 51.75 kg/m2     Not at goal , med adjustments needed will address at next in office veisit  Hyperlipidemia LDL goal <100 Hyperlipidemia:Low fat diet discussed and encouraged.   Lipid Panel  Lab Results  Component Value Date   CHOL 215 (H) 12/19/2023   HDL 66 12/19/2023   LDLCALC 131 (H) 12/19/2023   TRIG 103 12/19/2023   CHOLHDL 3.3 12/19/2023     Uncorrecred, needs updated lab  Encounter for immunization After obtaining informed consent, the pneumonia 20 vaccine is  administered , with no adverse effect noted at the time of administration.   Follow Up Instructions:    I discussed the  assessment and treatment plan with the patient. The patient was provided an opportunity to ask questions and all were answered. The patient agreed with the plan and demonstrated an understanding of the instructions.   The patient was advised to call back or seek an in-person evaluation if the symptoms worsen or if the condition fails to improve as anticipated.  I provided  25 minutes of non-face-to-face time during this encounter.   Alberteen Huge, MD

## 2024-04-14 NOTE — Telephone Encounter (Signed)
 Yes, on the 22 nd of May. Patient was called and made aware. She shared that she had got them.

## 2024-04-14 NOTE — Assessment & Plan Note (Signed)
 DASH diet and commitment to daily physical activity for a minimum of 30 minutes discussed and encouraged, as a part of hypertension management. The importance of attaining a healthy weight is also discussed.     04/11/2024    3:07 PM 03/11/2024    2:22 PM 03/05/2024   12:34 PM 02/26/2024    5:15 PM 01/29/2024   12:57 PM 01/24/2024    2:02 PM 01/08/2024    3:14 PM  BP/Weight  Systolic BP 144 149 -- 158 120 128 120  Diastolic BP 82 76 -- 74 76 77 62  Wt. (Lbs) 307.04 317.08 310  312 312 311  BMI 51.09 kg/m2 52.76 kg/m2 51.59 kg/m2  51.92 kg/m2 51.92 kg/m2 51.75 kg/m2     Not at goal , med adjustments needed will address at next in office veisit

## 2024-04-14 NOTE — Assessment & Plan Note (Signed)
  Patient re-educated about  the importance of commitment to a  minimum of 150 minutes of exercise per week as able.  The importance of healthy food choices with portion control discussed, as well as eating regularly and within a 12 hour window most days. The need to choose "clean , green" food 50 to 75% of the time is discussed, as well as to make water  the primary drink and set a goal of 64 ounces water  daily.       04/11/2024    3:07 PM 03/11/2024    2:22 PM 03/05/2024   12:34 PM  Weight /BMI  Weight 307 lb 0.6 oz 317 lb 1.3 oz 310 lb  Height  5\' 5"  (1.651 m) 5\' 5"  (1.651 m)  BMI 51.09 kg/m2 52.76 kg/m2 51.59 kg/m2    Improved which is great!

## 2024-04-14 NOTE — Assessment & Plan Note (Signed)
 Hyperlipidemia:Low fat diet discussed and encouraged.   Lipid Panel  Lab Results  Component Value Date   CHOL 215 (H) 12/19/2023   HDL 66 12/19/2023   LDLCALC 131 (H) 12/19/2023   TRIG 103 12/19/2023   CHOLHDL 3.3 12/19/2023     Uncorrecred, needs updated lab

## 2024-04-14 NOTE — Assessment & Plan Note (Signed)
 Resolved with treatment

## 2024-04-15 ENCOUNTER — Encounter: Payer: Self-pay | Admitting: Family Medicine

## 2024-04-15 ENCOUNTER — Other Ambulatory Visit: Payer: Self-pay

## 2024-04-15 ENCOUNTER — Ambulatory Visit: Payer: Medicare Other | Admitting: Nurse Practitioner

## 2024-04-15 ENCOUNTER — Encounter: Payer: Self-pay | Admitting: Nurse Practitioner

## 2024-04-15 ENCOUNTER — Ambulatory Visit: Payer: Self-pay | Admitting: Family Medicine

## 2024-04-15 VITALS — BP 122/84 | HR 78 | Ht 65.0 in | Wt 328.2 lb

## 2024-04-15 DIAGNOSIS — E559 Vitamin D deficiency, unspecified: Secondary | ICD-10-CM | POA: Diagnosis not present

## 2024-04-15 DIAGNOSIS — I10 Essential (primary) hypertension: Secondary | ICD-10-CM | POA: Diagnosis not present

## 2024-04-15 DIAGNOSIS — N1832 Chronic kidney disease, stage 3b: Secondary | ICD-10-CM

## 2024-04-15 DIAGNOSIS — R5383 Other fatigue: Secondary | ICD-10-CM

## 2024-04-15 DIAGNOSIS — M170 Bilateral primary osteoarthritis of knee: Secondary | ICD-10-CM | POA: Diagnosis not present

## 2024-04-15 DIAGNOSIS — E1122 Type 2 diabetes mellitus with diabetic chronic kidney disease: Secondary | ICD-10-CM

## 2024-04-15 DIAGNOSIS — Z794 Long term (current) use of insulin: Secondary | ICD-10-CM | POA: Diagnosis not present

## 2024-04-15 DIAGNOSIS — E782 Mixed hyperlipidemia: Secondary | ICD-10-CM | POA: Diagnosis not present

## 2024-04-15 DIAGNOSIS — E785 Hyperlipidemia, unspecified: Secondary | ICD-10-CM

## 2024-04-15 DIAGNOSIS — Z7985 Long-term (current) use of injectable non-insulin antidiabetic drugs: Secondary | ICD-10-CM

## 2024-04-15 LAB — LIPID PANEL
Chol/HDL Ratio: 4.4 ratio (ref 0.0–4.4)
Cholesterol, Total: 271 mg/dL — ABNORMAL HIGH (ref 100–199)
HDL: 61 mg/dL (ref >39)
LDL Chol Calc (NIH): 190 mg/dL — ABNORMAL HIGH (ref 0–99)
Triglycerides: 114 mg/dL (ref 0–149)
VLDL Cholesterol Cal: 20 mg/dL (ref 5–40)

## 2024-04-15 LAB — POCT GLYCOSYLATED HEMOGLOBIN (HGB A1C): Hemoglobin A1C: 7.1 % — AB (ref 4.0–5.6)

## 2024-04-15 MED ORDER — EZETIMIBE 10 MG PO TABS
10.0000 mg | ORAL_TABLET | Freq: Every day | ORAL | 5 refills | Status: AC
Start: 2024-04-15 — End: ?
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
## 2024-04-15 NOTE — Addendum Note (Signed)
 Addended by: Towanda Fret on: 04/15/2024 08:15 AM   Modules accepted: Orders

## 2024-04-16 ENCOUNTER — Encounter: Payer: Self-pay | Admitting: Nurse Practitioner

## 2024-04-17 ENCOUNTER — Encounter: Attending: Nurse Practitioner | Admitting: Dietician

## 2024-04-17 ENCOUNTER — Encounter: Payer: Self-pay | Admitting: Dietician

## 2024-04-17 ENCOUNTER — Telehealth: Payer: Self-pay | Admitting: *Deleted

## 2024-04-17 DIAGNOSIS — Z794 Long term (current) use of insulin: Secondary | ICD-10-CM | POA: Insufficient documentation

## 2024-04-17 DIAGNOSIS — N183 Chronic kidney disease, stage 3 unspecified: Secondary | ICD-10-CM | POA: Diagnosis not present

## 2024-04-17 DIAGNOSIS — E1122 Type 2 diabetes mellitus with diabetic chronic kidney disease: Secondary | ICD-10-CM | POA: Insufficient documentation

## 2024-04-17 MED ORDER — ACCU-CHEK GUIDE ME W/DEVICE KIT: PACK | 0 refills | Status: AC

## 2024-04-17 MED ORDER — ACCU-CHEK SOFTCLIX LANCETS MISC: 12 refills | Status: AC

## 2024-04-17 MED ORDER — ACCU-CHEK GUIDE TEST VI STRP: ORAL_STRIP | 12 refills | Status: AC

## 2024-04-17 NOTE — Telephone Encounter (Signed)
 Novo Nordisk was called. We verified that the patient has been getting Ozempic  2 mg. They  have two orders waiting to be shipped, they are going to expedite those and get them sent to our office. Patient has been called and made aware.

## 2024-04-17 NOTE — Progress Notes (Signed)
 Medical Nutrition Therapy  Appointment Start time: 1130  Appointment End time: 1200. This is a phone visit.  Patient is in her home and I am in my office.  Our last phone visit was 02/14/2024.  Steroid injections in her knees on Tuesday.  Steroids for allergies as well. Sensor was showing High and has asked for strips from her MD.  Currently her sensor reading is 319.  A1C has decreased. Will start Zetia  for her cholesterol as she did not tolerate the Crestor .  She is now using the Dexcom G7.  She cannot give me data as she needs to start a new sensor. Sensor reading 171 before bed and fasting blood glucose around 30. Eating out less. Armchair exercises.    Primary concerns: She would like to learn more about nutrition that is good for diabetes and CKD Referral diagnosis: Type 2 diabetes and stage 3b CKD  Preferred learning style: no preference indicated Learning readiness: ready  NUTRITION ASSESSMENT  Anthropometrics  65" 328 lbs 04/15/2024 (fluid) 312 lbs 01/29/2024 313 lbs 11/21/2023 302 lbs 09/20/2023 301 lbs 01/25/2023 306 lbs 12/24/2022 317 lbs 09/26/2022 328 lbs 08/22/2022 skipped her fluid pill yesterday 318 lbs 08/09/2022  UBW  Clinical Medical Hx: Type 2 Diabetes, CKD stage 2b, lymphedema, OSA c-pap (using this more 2/24), GERD, HTN, HLD, anemia, vitamin D  deficiency Medications/Supplements: 30 units q am and  25 q HS Humulin  70/30 , Ozempic , torsemide , potassium, calcitriol, MVI, Zetia , olmesartan Labs: A1C 7.1% 04/15/2024 decreased from 7.8% 01/08/24  BUN 28, Creatinine 1.65, Potassium 4.6 on 09/13/2023 and eGFR 35 01/26/2024 Lipid Panel     Component Value Date/Time   CHOL 271 (H) 04/14/2024 0915   TRIG 114 04/14/2024 0915   HDL 61 04/14/2024 0915   CHOLHDL 4.4 04/14/2024 0915   CHOLHDL 4.1 09/02/2019 0846   VLDL 15 04/12/2017 1121   LDLCALC 190 (H) 04/14/2024 0915   LDLCALC 131 (H) 09/02/2019 0846   LABVLDL 20 04/14/2024 0915   Lab Results  Component Value Date    CHOL 215 (H) 12/19/2023   CHOL 240 (H) 09/13/2023   CHOL 236 (H) 06/15/2023   Lab Results  Component Value Date   LDLCALC 131 (H) 12/19/2023   LDLCALC 158 (H) 09/13/2023   LDLCALC 145 (H) 06/15/2023   CGM:  Dexcom Notable Signs/Symptoms: swelling feet, fluid weight gain  Lifestyle & Dietary Hx Patient lives with her husband.  They share shopping and cooking.  She uses an Engineer, petroleum at the store.  Eats out 3-4 times per week (asks for a to-go box at the beginning of the meal) She is on disability.  She used to work as an Loss adjuster, chartered for 40 years. Avoids pork, uses I can't believe it's not butter, raw vegetables make her nauseas Has dentures and unable to tolerate hard food  Estimated daily fluid intake: 64 oz fluid restriction Sleep: 6-7 hours hours - no longer wearing c-pap Stress / self-care: none Current average weekly physical activity: lymphedema and knee problems prohibit - doing some chair exercises CGM:  Dexcom G7 - occasional lows overnight (to 42 this week.    24-Hr Dietary Recall First Meal:  plain cheerios, 2% milk, splenda Snack:  none Second Meal:  [place] sub 6" but going out today Snack:  pineapple bites Third Meal:  fried chicken wings with sauce, peaches Snack:  PB crackers Beverages: water , sugar free Twist, occasional regular gingerale  Estimated Energy Needs Protein: 85-95 g  NUTRITION DIAGNOSIS  NB-1.1 Food and nutrition-related knowledge  deficit As related to balance of carbohydrates, protein, and fat along with guidelines for CKD.  As evidenced by diet hx and patient report.  NUTRITION INTERVENTION  Nutrition education (E-1) on the following topics:  continued Discussed cholesterol labs Discussed diet changes necessary with high blood glucose Discussed DKA, what it is, when and how to test, when to seek help Discussed diet necessary with Ozempic , DM and kidney function  Handouts Provided Include - previous visit [facility] national kidney diet -  Dish up a Kidney-Friendly Meal for Patients with Chronic Kidney Disease (not on dialysis) Label reading [employer] of Lifestyle Medicine packet.  Learning Style & Readiness for Change Teaching method utilized: Visual & Auditory  Demonstrated degree of understanding via: Teach Back  Barriers to learning/adherence to lifestyle change: health issues  Goals Low fat Small portions Consider checking your ketones if you blood glucose remains over 300. Increase your vegetable intake  MONITORING & EVALUATION Dietary intake, weekly physical activity, and label reading in 4 months.  Next Steps  Patient is to call for questions.

## 2024-04-23 ENCOUNTER — Encounter (INDEPENDENT_AMBULATORY_CARE_PROVIDER_SITE_OTHER): Payer: Self-pay | Admitting: Gastroenterology

## 2024-04-24 ENCOUNTER — Other Ambulatory Visit: Payer: Self-pay

## 2024-04-24 ENCOUNTER — Encounter (HOSPITAL_COMMUNITY): Payer: Self-pay | Admitting: Emergency Medicine

## 2024-04-24 ENCOUNTER — Emergency Department (HOSPITAL_COMMUNITY)

## 2024-04-24 ENCOUNTER — Emergency Department (HOSPITAL_COMMUNITY)
Admission: EM | Admit: 2024-04-24 | Discharge: 2024-04-24 | Disposition: A | Discharge status: Home / Self Care | Attending: Emergency Medicine | Admitting: Emergency Medicine

## 2024-04-24 DIAGNOSIS — M25562 Pain in left knee: Secondary | ICD-10-CM | POA: Diagnosis not present

## 2024-04-24 DIAGNOSIS — Y92511 Restaurant or cafe as the place of occurrence of the external cause: Secondary | ICD-10-CM | POA: Insufficient documentation

## 2024-04-24 DIAGNOSIS — W19XXXA Unspecified fall, initial encounter: Secondary | ICD-10-CM | POA: Diagnosis not present

## 2024-04-24 DIAGNOSIS — Z794 Long term (current) use of insulin: Secondary | ICD-10-CM | POA: Diagnosis not present

## 2024-04-24 DIAGNOSIS — S8992XA Unspecified injury of left lower leg, initial encounter: Secondary | ICD-10-CM | POA: Insufficient documentation

## 2024-04-24 DIAGNOSIS — M1712 Unilateral primary osteoarthritis, left knee: Secondary | ICD-10-CM | POA: Diagnosis not present

## 2024-04-24 MED ORDER — LIDOCAINE 5 % EX PTCH
1.0000 | MEDICATED_PATCH | CUTANEOUS | Status: DC
  Administered 2024-04-24: 1 via TRANSDERMAL
  Filled 2024-04-24: qty 1

## 2024-04-24 MED ORDER — ACETAMINOPHEN 500 MG PO TABS
1000.0000 mg | ORAL_TABLET | Freq: Once | ORAL | Status: AC
  Administered 2024-04-24: 1000 mg via ORAL
  Filled 2024-04-24: qty 2

## 2024-04-24 NOTE — Discharge Instructions (Signed)
 You were seen for your knee injury in the emergency department.   At home, please take tylenol and topical treatments like lidocaine  and/or IcyHot for your pain.  Please rest your joint and elevate it.  Use ice to limit the swelling.  You may use the rolling walker to assist with ambulation.  Check your MyChart online for the results of any tests that had not resulted by the time you left the emergency department.   Follow-up with orthopedics or sports medicine in 1 week if you are still having pain.  Return immediately to the emergency department if you experience any of the following: Worsening pain, or any other concerning symptoms.    Thank you for visiting our Emergency Department. It was a pleasure taking care of you today.

## 2024-04-24 NOTE — ED Provider Notes (Signed)
 [place] EMERGENCY DEPARTMENT AT [facility] Provider Note   CSN: [id number] Arrival date & time: 04/24/24  1800     Patient presents with: Fall and Knee Injury   Kendra Hanson is a 63 y.o. female.   63 year old female with a history of osteoarthritis who presents emergency department with left knee pain after a fall.  Patient reports that she was going into an establishment when she went to lean on something that she thought was a concrete barrier that was actually not fixed to the ground.  She slowly went down and landed on her left knee.  No head strike or LOC.  No other injuries.  Has been ambulatory since.  Has a history of arthritis in that knee in the been getting shots in her knee but no surgeries on that knee.       Prior to Admission medications   Medication Sig Start Date End Date Taking? Authorizing Provider  Accu-Chek Softclix Lancets lancets Use as instructed to monitor glucose 4 times daily, use as backup to Dexcom 04/17/24   Wendel Hals, NP  acetaminophen (TYLENOL) 500 MG tablet Take 500 mg by mouth every 6 (six) hours as needed.    [provider]  albuterol  (VENTOLIN  HFA) 108 (90 Base) MCG/ACT inhaler Inhale 2 puffs into the lungs every 4 (four) hours as needed. 02/26/24   Corbin Dess, PA-C  Blood Glucose Monitoring Suppl (ACCU-CHEK GUIDE ME) w/Device KIT Use to check glucose 4 times daily- use as backup to Dexcom 04/17/24   Reardon, Whitney J, NP  calcitRIOL (ROCALTROL) 0.25 MCG capsule Take 0.25 mcg by mouth 3 (three) times a week. 11/24/21   [provider]  Continuous Glucose Sensor (DEXCOM G7 SENSOR) MISC Change every 10 days. E11.65 02/04/24   Wendel Hals, NP  diltiazem  (TIADYLT  ER) 420 MG 24 hr capsule Take 1 capsule (420 mg total) by mouth daily. 04/11/24   Towanda Fret, MD  ezetimibe  (ZETIA ) 10 MG tablet Take 1 tablet (10 mg total) by mouth daily. 04/15/24   Towanda Fret, MD  famotidine  (PEPCID ) 40  MG tablet Take 1 tablet (40 mg total) by mouth 2 (two) times daily. 04/11/24   Towanda Fret, MD  GLOBAL EASE INJECT PEN NEEDLES 31G X 8 MM MISC USE UP TO 5 TIMES A DAY AS DIRECTED. 05/13/19   Lanetta Pion, NP  glucose blood (ACCU-CHEK GUIDE TEST) test strip Use as instructed to monitor glucose 4 times daily, use as backup to Dexcom 04/17/24   Wendel Hals, NP  insulin  aspart protamine - aspart (NOVOLOG  MIX 70/30 FLEXPEN) (70-30) 100 UNIT/ML FlexPen Inject 35 Units into the skin 2 (two) times daily with a meal. 01/09/24   Wendel Hals, NP  ipratropium (ATROVENT ) 0.03 % nasal spray Place 2 sprays into both nostrils every 12 (twelve) hours. 02/24/24   Fleming, Zelda W, NP  loratadine  (CLARITIN ) 10 MG tablet Take one tablet  by mouth twice daily 03/11/24   Towanda Fret, MD  montelukast  (SINGULAIR ) 10 MG tablet Take 10 mg by mouth as needed.    [provider]  montelukast  (SINGULAIR ) 10 MG tablet Take 1 tablet (10 mg total) by mouth at bedtime. 03/11/24   Towanda Fret, MD  Multiple Vitamins-Minerals (ALIVE ONCE DAILY WOMENS 50+ PO) Take by mouth daily.    [provider]  olmesartan (BENICAR) 5 MG tablet Take 5 mg by mouth daily. 10/25/23   [provider]  OZEMPIC , 2 MG/DOSE, 8 MG/3ML SOPN Inject 2 mg as directed once a week. 02/26/24   Wendel Hals, NP  pantoprazole  (PROTONIX ) 20 MG tablet Take 1 tablet (20 mg total) by mouth daily. 02/07/24   Towanda Fret, MD  potassium chloride  (KLOR-CON ) 10 MEQ tablet TAKE 1 TABLET BY MOUTH (3) TIMES DAILY. 02/19/24   Towanda Fret, MD  pregabalin (LYRICA) 50 MG capsule Take 50 mg by mouth daily. 02/14/23   [provider]  psyllium (METAMUCIL) 58.6 % packet Take 1 packet by mouth. prn    [provider]  rosuvastatin  (CRESTOR ) 10 MG tablet Take 1 tablet (10 mg total) by mouth daily. 09/20/23   Towanda Fret, MD  simethicone  (MYLICON) 80 MG chewable tablet Chew 80 mg by mouth  every 6 (six) hours as needed for flatulence.    [provider]  SURE COMFORT INS SYR .5CC/30G 30G X 5/16 0.5 ML MISC USE AS DIRECTED 5 TIMES DAILY. 05/01/22   Nida, Gebreselassie W, MD  torsemide  (DEMADEX ) 20 MG tablet Take 20 mg by mouth daily. 09/28/20   Towanda Fret, MD    Allergies: Nsaids, Diclofenac, Oxycodone, Statins, and Other    Review of Systems  Updated Vital Signs BP 134/61 (BP Location: Left Wrist)   Pulse 95   Temp 98.3 F (36.8 C) (Oral)   Resp 18   Ht 5' 5 (1.651 m)   Wt (!) 148.9 kg   SpO2 98%   BMI 54.62 kg/m   Physical Exam  Musculoskeletal:     Comments: Tenderness to palpation of patella, medial and lateral aspects of the knee and posterior aspect of the knee.  No joint laxity with valgus and varus stress.  No laxity with anterior drawer test.  No obvious deformity or swelling of the leg.  DP pulse 2+ and left foot and foot appears warm well-perfused.  Intact sensation to light touch of the foot.     (all labs ordered are listed, but only abnormal results are displayed) Labs Reviewed - No data to display  EKG: None  Radiology: DG Knee Complete 4 Views Left Result Date: 04/24/2024 CLINICAL DATA:  Recent fall with left knee pain, initial encounter EXAM: LEFT KNEE - COMPLETE 4+ VIEW COMPARISON:  None Available. FINDINGS: Tricompartmental degenerative changes are noted. No acute fracture or dislocation is seen. No soft tissue abnormality is noted. IMPRESSION: Degenerative change without acute abnormality. Electronically Signed   By: Violeta Grey M.D.   On: 04/24/2024 19:07     Procedures   Medications Ordered in the ED  lidocaine  (LIDODERM ) 5 % 1 patch (1 patch Transdermal Patch Applied 04/24/24 1941)  acetaminophen (TYLENOL) tablet 1,000 mg (1,000 mg Oral Given 04/24/24 1940)                                    Medical Decision Making Risk OTC drugs. Prescription drug management.   63 year old female with a history of  osteoarthritis who presents emergency department with left knee pain after a fall.  Initial Ddx:  Fracture, dislocation, contusion, ligamentous injury  MDM/Course:  Patient presents emergency department after a fall with knee pain.  On exam no obvious deformities.  As well as full range of motion of the knee.  No obvious effusion noted.  No joint laxity.  Had x-rays that did not show evidence of fracture.  Suspect that she may have a contusion is  causing her pain.  Does have a history of osteoarthritis and will have her follow-up with orthopedics regarding this and any persistent pain that she may have.  Given a prescription for a rollator in case she is having difficulty walking due to the pain.  This patient presents to the ED for concern of complaints listed in HPI, this involves an extensive number of treatment options, and is a complaint that carries with it a high risk of complications and morbidity. Disposition including potential need for admission considered.   Dispo: DC Home. Return precautions discussed including, but not limited to, those listed in the AVS. Allowed pt time to ask questions which were answered fully prior to dc.  Additional history obtained from spouse Records reviewed Outpatient Clinic Notes I independently reviewed the following imaging with scope of interpretation limited to determining acute life threatening conditions related to emergency care: Extremity x-ray(s) and agree with the radiologist interpretation with the following exceptions: none I have reviewed the patients home medications and made adjustments as needed  Portions of this note were generated with Dragon dictation software. Dictation errors may occur despite best attempts at proofreading.     Final diagnoses:  Injury of left knee, initial encounter  Fall, initial encounter    ED Discharge Orders          Ordered    Walker rolling        04/24/24 1903               Ninetta Basket, MD 04/24/24 2337

## 2024-04-24 NOTE — ED Triage Notes (Signed)
 Pt wheeled into triage. Pt reports that she fell outside a restaurant and landed on her left side. Denies LOC, denies head strike. No blood thinners. Pt endorses left knee pain, 9/10. Left knee slightly swollen, no bruising noted.   Pt denies numbness or tingling in left extremity.

## 2024-05-08 DIAGNOSIS — M47816 Spondylosis without myelopathy or radiculopathy, lumbar region: Secondary | ICD-10-CM | POA: Diagnosis not present

## 2024-05-08 DIAGNOSIS — M25562 Pain in left knee: Secondary | ICD-10-CM | POA: Diagnosis not present

## 2024-05-14 ENCOUNTER — Other Ambulatory Visit: Payer: Self-pay | Admitting: Family Medicine

## 2024-05-14 DIAGNOSIS — E876 Hypokalemia: Secondary | ICD-10-CM

## 2024-06-04 NOTE — Procedures (Signed)
 SABRA

## 2024-06-16 ENCOUNTER — Encounter: Payer: Self-pay | Admitting: Nurse Practitioner

## 2024-06-16 ENCOUNTER — Other Ambulatory Visit: Payer: Self-pay | Admitting: Nurse Practitioner

## 2024-06-16 DIAGNOSIS — E1122 Type 2 diabetes mellitus with diabetic chronic kidney disease: Secondary | ICD-10-CM

## 2024-06-16 NOTE — Telephone Encounter (Signed)
 Haven't we sent something recently for her?

## 2024-06-18 NOTE — Telephone Encounter (Signed)
 Yes, it was sent on 06/17/2024.

## 2024-06-20 ENCOUNTER — Other Ambulatory Visit: Payer: Self-pay | Admitting: *Deleted

## 2024-06-20 DIAGNOSIS — Z7985 Long-term (current) use of injectable non-insulin antidiabetic drugs: Secondary | ICD-10-CM

## 2024-06-20 DIAGNOSIS — N1832 Chronic kidney disease, stage 3b: Secondary | ICD-10-CM

## 2024-06-20 DIAGNOSIS — Z794 Long term (current) use of insulin: Secondary | ICD-10-CM

## 2024-06-20 MED ORDER — DEXCOM G7 SENSOR MISC: 3 refills | Status: DC

## 2024-06-20 NOTE — Telephone Encounter (Signed)
 Patient has ask that her prescription be sent to [facility], [place]. Fax number [phone].

## 2024-07-03 ENCOUNTER — Other Ambulatory Visit: Payer: Self-pay | Admitting: Family Medicine

## 2024-07-08 DIAGNOSIS — E1165 Type 2 diabetes mellitus with hyperglycemia: Secondary | ICD-10-CM | POA: Diagnosis not present

## 2024-07-11 ENCOUNTER — Other Ambulatory Visit: Payer: Self-pay | Admitting: Nurse Practitioner

## 2024-07-15 ENCOUNTER — Telehealth: Payer: Self-pay | Admitting: *Deleted

## 2024-07-15 MED ORDER — INSULIN LISPRO PROT & LISPRO (75-25 MIX) 100 UNIT/ML KWIKPEN: 35.0000 [IU] | PEN_INJECTOR | Freq: Two times a day (BID) | SUBCUTANEOUS | 3 refills | Status: AC

## 2024-07-15 NOTE — Telephone Encounter (Signed)
 I have sent in the new prescription for the 75/25 mix instead.

## 2024-07-15 NOTE — Telephone Encounter (Signed)
 [facility] left a message that the patient's insurance will not cover the Novolog  70/30. They are wanting the patient to try Humalog  75/25. They ask that you resend the prescription to them.  [name] also called and she shares that she has enough on hand to cover tonight. She will need the new prescription for morning dose.

## 2024-07-16 NOTE — Telephone Encounter (Signed)
 Patient was called and made aware. We will send in a request to [facility].Patient will try and get this one at [facility] while we wait for the outcome from [facility]. Patient is aware of the dosage.

## 2024-07-17 ENCOUNTER — Telehealth: Payer: Self-pay | Admitting: *Deleted

## 2024-07-17 NOTE — Telephone Encounter (Signed)
 [name] left a message stating she would like a call back about her insulin , (Novolog ).  When called back she had called [facility] and she was told that they were sending her Novolog  70/30 to our office in 48 hours. That they were going to send her a text message. I just sent in a updated form to them on 07/16/2024.  I advised the patient that we close on Fridays at 12 pm and if after that time there would be no one here to get it. She ask for a call when it arrived.

## 2024-07-31 DIAGNOSIS — E119 Type 2 diabetes mellitus without complications: Secondary | ICD-10-CM | POA: Diagnosis not present

## 2024-07-31 DIAGNOSIS — N189 Chronic kidney disease, unspecified: Secondary | ICD-10-CM | POA: Diagnosis not present

## 2024-07-31 DIAGNOSIS — R809 Proteinuria, unspecified: Secondary | ICD-10-CM | POA: Diagnosis not present

## 2024-07-31 DIAGNOSIS — N1832 Chronic kidney disease, stage 3b: Secondary | ICD-10-CM | POA: Diagnosis not present

## 2024-07-31 DIAGNOSIS — D631 Anemia in chronic kidney disease: Secondary | ICD-10-CM | POA: Diagnosis not present

## 2024-08-01 LAB — HEPATIC FUNCTION PANEL
ALT: 15 IU/L (ref 0–32)
AST: 15 IU/L (ref 0–40)
Albumin: 4.2 g/dL (ref 3.9–4.9)
Alkaline Phosphatase: 135 IU/L (ref 49–135)
Bilirubin Total: 0.4 mg/dL (ref 0.0–1.2)
Bilirubin, Direct: 0.16 mg/dL (ref 0.00–0.40)
Total Protein: 7 g/dL (ref 6.0–8.5)

## 2024-08-01 LAB — LIPID PANEL
Chol/HDL Ratio: 4.1 ratio (ref 0.0–4.4)
Cholesterol, Total: 273 mg/dL — ABNORMAL HIGH (ref 100–199)
HDL: 67 mg/dL (ref >39)
LDL Chol Calc (NIH): 187 mg/dL — ABNORMAL HIGH (ref 0–99)
Triglycerides: 111 mg/dL (ref 0–149)
VLDL Cholesterol Cal: 19 mg/dL (ref 5–40)

## 2024-08-04 ENCOUNTER — Telehealth: Payer: Self-pay | Admitting: Family Medicine

## 2024-08-04 ENCOUNTER — Other Ambulatory Visit: Payer: Self-pay

## 2024-08-04 DIAGNOSIS — E876 Hypokalemia: Secondary | ICD-10-CM

## 2024-08-04 MED ORDER — POTASSIUM CHLORIDE ER 10 MEQ PO TBCR: 10.0000 meq | EXTENDED_RELEASE_TABLET | Freq: Three times a day (TID) | ORAL | 0 refills | Status: DC

## 2024-08-04 MED ORDER — TORSEMIDE 20 MG PO TABS: 20.0000 mg | ORAL_TABLET | Freq: Every day | ORAL | 3 refills | Status: AC

## 2024-08-04 MED ORDER — PANTOPRAZOLE SODIUM 20 MG PO TBEC: 20.0000 mg | DELAYED_RELEASE_TABLET | Freq: Every day | ORAL | 1 refills | Status: AC

## 2024-08-04 NOTE — Telephone Encounter (Signed)
 Patient cholesterol  still high what does patient need to do video visit setup now with iliana.  Need refills:   Prescription Request  08/04/2024  LOV: 03/11/2024  What is the name of the medication or equipment? diltiazem  (TIADYLT  ER) 420 MG 24 hr capsule   ezetimibe  (ZETIA ) 10 MG tablet [[id number]]   montelukast  (SINGULAIR ) 10 MG tablet [   potassium chloride  (KLOR-CON ) 10 MEQ tablet   pantoprazole  (PROTONIX ) 20 MG tablet [[id number]]     torsemide  (DEMADEX ) 20 MG tablet [[id number]]    Have you contacted your pharmacy to request a refill? No   Which pharmacy would you like this sent to?   pHarmacy: [facility]  Patient notified that their request is being sent to the clinical staff for review and that they should receive a response within 2 business days.   Please advise at Mobile [phone] (mobile)

## 2024-08-04 NOTE — Telephone Encounter (Signed)
 Medication that is in need of refills have been refilled, other medications are at pharmacy

## 2024-08-06 ENCOUNTER — Telehealth: Payer: Self-pay | Admitting: Nurse Practitioner

## 2024-08-06 NOTE — Telephone Encounter (Signed)
 Pt picked up pt assistance of pen needles and ozempic 

## 2024-08-08 ENCOUNTER — Telehealth (INDEPENDENT_AMBULATORY_CARE_PROVIDER_SITE_OTHER): Admitting: Family Medicine

## 2024-08-08 ENCOUNTER — Ambulatory Visit (INDEPENDENT_AMBULATORY_CARE_PROVIDER_SITE_OTHER)

## 2024-08-08 ENCOUNTER — Telehealth: Payer: Self-pay | Admitting: Pharmacy Technician

## 2024-08-08 ENCOUNTER — Other Ambulatory Visit (HOSPITAL_COMMUNITY): Payer: Self-pay

## 2024-08-08 DIAGNOSIS — E782 Mixed hyperlipidemia: Secondary | ICD-10-CM

## 2024-08-08 DIAGNOSIS — E785 Hyperlipidemia, unspecified: Secondary | ICD-10-CM | POA: Diagnosis not present

## 2024-08-08 DIAGNOSIS — E612 Magnesium deficiency: Secondary | ICD-10-CM

## 2024-08-08 DIAGNOSIS — Z23 Encounter for immunization: Secondary | ICD-10-CM | POA: Diagnosis not present

## 2024-08-08 DIAGNOSIS — D509 Iron deficiency anemia, unspecified: Secondary | ICD-10-CM | POA: Diagnosis not present

## 2024-08-08 MED ORDER — NEXLETOL 180 MG PO TABS: 180.0000 mg | ORAL_TABLET | Freq: Every day | ORAL | 3 refills | Status: DC

## 2024-08-08 MED ORDER — ONDANSETRON HCL 4 MG PO TABS: 4.0000 mg | ORAL_TABLET | Freq: Three times a day (TID) | ORAL | 0 refills | Status: AC | PRN

## 2024-08-08 NOTE — Telephone Encounter (Signed)
 Pharmacy Patient Advocate Encounter  Received notification from [facility] that Prior Authorization for Nexletol  180MG  tablets has been APPROVED from 08/08/2024 to 02/05/2025. Unable to obtain price due to refill too soon rejection, last fill date 08/08/2024 next available fill date10/19/2025.   PA #/Case ID/Reference #: [id number]

## 2024-08-08 NOTE — Assessment & Plan Note (Signed)
 Previous LDL 187 not controlled Patient reports stop taking stain and zetia  due to muscle aches Trial on Nextletol 180 mg once daily Discussed Limit: Saturated fats: Butter, cream, fatty meats. Trans fats: Fried foods, processed snacks. Sugar and refined carbs: Sweets, white bread. Focus on whole foods, healthy fats, and fiber to improve heart health! Maintain an exercise routine 3 to 5 days a week for a minimum total of 150 minutes.

## 2024-08-08 NOTE — Progress Notes (Signed)
 Patient is in office today for a nurse visit for Immunization. Patient Injection was given in the  Left arm. Patient tolerated injection well.

## 2024-08-08 NOTE — Telephone Encounter (Signed)
 Pharmacy Patient Advocate Encounter   Received notification from CoverMyMeds that prior authorization for Nexletol  180MG  tablets is required/requested.   Insurance verification completed.   The patient is insured through [place] .   Per test claim: PA required; PA submitted to above mentioned insurance via Latent Key/confirmation #/EOC [id number] Status is pending

## 2024-08-08 NOTE — Progress Notes (Signed)
 Virtual Visit via Video Note  I connected with Kendra Hanson on 08/08/24 at 10:00 AM EDT by a video enabled telemedicine application and verified that I am speaking with the correct person using two identifiers.  Patient Location: Home Provider Location: Office/Clinic  I discussed the limitations, risks, security, and privacy concerns of performing an evaluation and management service by video and the availability of in person appointments. I also discussed with the patient that there may be a patient responsible charge related to this service. The patient expressed understanding and agreed to proceed.  Subjective: PCP: Antonetta Rollene FORBES, MD  No chief complaint on file.  HPI Patient presents via telehealth for hyperlipidemia. For the details of today's visit, please refer to assessment and plan.    ROS: Per HPI  Current Outpatient Medications:    Bempedoic Acid  (NEXLETOL ) 180 MG TABS, Take 1 tablet (180 mg total) by mouth daily., Disp: 30 tablet, Rfl: 3   ondansetron  (ZOFRAN ) 4 MG tablet, Take 1 tablet (4 mg total) by mouth every 8 (eight) hours as needed for nausea or vomiting., Disp: 20 tablet, Rfl: 0   Accu-Chek Softclix Lancets lancets, Use as instructed to monitor glucose 4 times daily, use as backup to Dexcom, Disp: 100 each, Rfl: 12   acetaminophen  (TYLENOL ) 500 MG tablet, Take 500 mg by mouth every 6 (six) hours as needed., Disp: , Rfl:    albuterol  (VENTOLIN  HFA) 108 (90 Base) MCG/ACT inhaler, Inhale 2 puffs into the lungs every 4 (four) hours as needed., Disp: 18 g, Rfl: 0   Blood Glucose Monitoring Suppl (ACCU-CHEK GUIDE ME) w/Device KIT, Use to check glucose 4 times daily- use as backup to Dexcom, Disp: 1 kit, Rfl: 0   calcitRIOL (ROCALTROL) 0.25 MCG capsule, Take 0.25 mcg by mouth 3 (three) times a week., Disp: , Rfl:    Continuous Glucose Sensor (DEXCOM G7 SENSOR) MISC, Change every 10 days to monitor blood glucose as directed by provider . E11.65, Disp: 9 each, Rfl:  3   diltiazem  (TIADYLT  ER) 420 MG 24 hr capsule, Take 1 capsule (420 mg total) by mouth daily., Disp: 90 capsule, Rfl: 3   ezetimibe  (ZETIA ) 10 MG tablet, Take 1 tablet (10 mg total) by mouth daily., Disp: 30 tablet, Rfl: 5   famotidine  (PEPCID ) 40 MG tablet, Take 1 tablet (40 mg total) by mouth 2 (two) times daily., Disp: 180 tablet, Rfl: 3   GLOBAL EASE INJECT PEN NEEDLES 31G X 8 MM MISC, USE UP TO 5 TIMES A DAY AS DIRECTED., Disp: 150 each, Rfl: PRN   glucose blood (ACCU-CHEK GUIDE TEST) test strip, Use as instructed to monitor glucose 4 times daily, use as backup to Dexcom, Disp: 100 each, Rfl: 12   Insulin  Lispro Prot & Lispro (HUMALOG  MIX 75/25 KWIKPEN) (75-25) 100 UNIT/ML Kwikpen, Inject 35 Units into the skin 2 (two) times daily before a meal., Disp: 70 mL, Rfl: 3   ipratropium (ATROVENT ) 0.03 % nasal spray, Place 2 sprays into both nostrils every 12 (twelve) hours., Disp: 30 mL, Rfl: 0   loratadine  (CLARITIN ) 10 MG tablet, Take one tablet  by mouth twice daily, Disp: 60 tablet, Rfl: 5   montelukast  (SINGULAIR ) 10 MG tablet, Take 10 mg by mouth as needed., Disp: , Rfl:    montelukast  (SINGULAIR ) 10 MG tablet, Take 1 tablet (10 mg total) by mouth at bedtime., Disp: 90 tablet, Rfl: 3   Multiple Vitamins-Minerals (ALIVE ONCE DAILY WOMENS 50+ PO), Take by mouth daily., Disp: ,  Rfl:    olmesartan (BENICAR) 5 MG tablet, Take 5 mg by mouth daily., Disp: , Rfl:    OZEMPIC , 2 MG/DOSE, 8 MG/3ML SOPN, Inject 2 mg as directed once a week., Disp: 3 mL, Rfl: 0   pantoprazole  (PROTONIX ) 20 MG tablet, Take 1 tablet (20 mg total) by mouth daily., Disp: 90 tablet, Rfl: 1   potassium chloride  (KLOR-CON ) 10 MEQ tablet, Take 1 tablet (10 mEq total) by mouth 3 (three) times daily., Disp: 90 tablet, Rfl: 0   pregabalin (LYRICA) 50 MG capsule, Take 50 mg by mouth daily., Disp: , Rfl:    psyllium (METAMUCIL) 58.6 % packet, Take 1 packet by mouth. prn, Disp: , Rfl:    rosuvastatin  (CRESTOR ) 10 MG tablet, Take 1  tablet (10 mg total) by mouth daily., Disp: 90 tablet, Rfl: 3   simethicone  (MYLICON) 80 MG chewable tablet, Chew 80 mg by mouth every 6 (six) hours as needed for flatulence., Disp: , Rfl:    SURE COMFORT INS SYR .5CC/30G 30G X 5/16 0.5 ML MISC, USE AS DIRECTED 5 TIMES DAILY., Disp: 100 each, Rfl: PRN   torsemide  (DEMADEX ) 20 MG tablet, Take 1 tablet (20 mg total) by mouth daily., Disp: 60 tablet, Rfl: 3  Observations/Objective: There were no vitals filed for this visit. Physical Exam Patient is alert and no acute distress noted.   Assessment and Plan: Iron deficiency anemia, unspecified iron deficiency anemia type -     Iron, TIBC and Ferritin Panel  Magnesium  deficiency -     Magnesium   Mixed hyperlipidemia -     Nexletol ; Take 1 tablet (180 mg total) by mouth daily.  Dispense: 30 tablet; Refill: 3  Hyperlipidemia LDL goal <100 Assessment & Plan: Previous LDL 187 not controlled Patient reports stop taking stain and zetia  due to muscle aches Trial on Nextletol 180 mg once daily Discussed Limit: Saturated fats: Butter, cream, fatty meats. Trans fats: Fried foods, processed snacks. Sugar and refined carbs: Sweets, white bread. Focus on whole foods, healthy fats, and fiber to improve heart health! Maintain an exercise routine 3 to 5 days a week for a minimum total of 150 minutes.    Other orders -     Ondansetron  HCl; Take 1 tablet (4 mg total) by mouth every 8 (eight) hours as needed for nausea or vomiting.  Dispense: 20 tablet; Refill: 0    Follow Up Instructions: No follow-ups on file.   I discussed the assessment and treatment plan with the patient. The patient was provided an opportunity to ask questions, and all were answered. The patient agreed with the plan and demonstrated an understanding of the instructions.   The patient was advised to call back or seek an in-person evaluation if the symptoms worsen or if the condition fails to improve as anticipated.  The above  assessment and management plan was discussed with the patient. The patient verbalized understanding of and has agreed to the management plan.   Hilario Kidd Wilhelmena Falter, FNP

## 2024-08-09 LAB — IRON,TIBC AND FERRITIN PANEL
Ferritin: 114 ng/mL (ref 15–150)
Iron Saturation: 14 % — ABNORMAL LOW (ref 15–55)
Iron: 48 ug/dL (ref 27–139)
Total Iron Binding Capacity: 337 ug/dL (ref 250–450)
UIBC: 289 ug/dL (ref 118–369)

## 2024-08-09 LAB — MAGNESIUM: Magnesium: 2 mg/dL (ref 1.6–2.3)

## 2024-08-11 ENCOUNTER — Ambulatory Visit: Payer: Self-pay | Admitting: Family Medicine

## 2024-08-11 DIAGNOSIS — E1129 Type 2 diabetes mellitus with other diabetic kidney complication: Secondary | ICD-10-CM | POA: Diagnosis not present

## 2024-08-11 DIAGNOSIS — R809 Proteinuria, unspecified: Secondary | ICD-10-CM | POA: Diagnosis not present

## 2024-08-11 DIAGNOSIS — E1122 Type 2 diabetes mellitus with diabetic chronic kidney disease: Secondary | ICD-10-CM | POA: Diagnosis not present

## 2024-08-11 DIAGNOSIS — N1832 Chronic kidney disease, stage 3b: Secondary | ICD-10-CM | POA: Diagnosis not present

## 2024-08-14 ENCOUNTER — Ambulatory Visit: Payer: Self-pay | Admitting: Family Medicine

## 2024-08-18 ENCOUNTER — Ambulatory Visit: Admitting: Nurse Practitioner

## 2024-08-18 ENCOUNTER — Telehealth: Payer: Self-pay | Admitting: Nurse Practitioner

## 2024-08-18 ENCOUNTER — Encounter: Payer: Self-pay | Admitting: Nurse Practitioner

## 2024-08-18 VITALS — BP 116/80 | HR 69 | Ht 65.0 in | Wt 330.0 lb

## 2024-08-18 DIAGNOSIS — Z7985 Long-term (current) use of injectable non-insulin antidiabetic drugs: Secondary | ICD-10-CM | POA: Diagnosis not present

## 2024-08-18 DIAGNOSIS — Z789 Other specified health status: Secondary | ICD-10-CM

## 2024-08-18 DIAGNOSIS — N1832 Chronic kidney disease, stage 3b: Secondary | ICD-10-CM

## 2024-08-18 DIAGNOSIS — E782 Mixed hyperlipidemia: Secondary | ICD-10-CM | POA: Diagnosis not present

## 2024-08-18 DIAGNOSIS — R5383 Other fatigue: Secondary | ICD-10-CM | POA: Diagnosis not present

## 2024-08-18 DIAGNOSIS — Z794 Long term (current) use of insulin: Secondary | ICD-10-CM

## 2024-08-18 DIAGNOSIS — E1122 Type 2 diabetes mellitus with diabetic chronic kidney disease: Secondary | ICD-10-CM | POA: Diagnosis not present

## 2024-08-18 DIAGNOSIS — I10 Essential (primary) hypertension: Secondary | ICD-10-CM

## 2024-08-18 DIAGNOSIS — E559 Vitamin D deficiency, unspecified: Secondary | ICD-10-CM | POA: Diagnosis not present

## 2024-08-18 LAB — POCT GLYCOSYLATED HEMOGLOBIN (HGB A1C): HbA1c, POC (controlled diabetic range): 7.8 % — AB (ref 0.0–7.0)

## 2024-08-18 MED ORDER — DEXCOM G7 SENSOR MISC: 3 refills | Status: AC

## 2024-08-18 NOTE — Telephone Encounter (Signed)
 Pt picked up PAP at visit
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
## 2024-08-21 ENCOUNTER — Telehealth: Payer: Self-pay | Admitting: Nurse Practitioner

## 2024-08-21 NOTE — Telephone Encounter (Signed)
 Pt called and made aware her PAP is here for pick up

## 2024-08-21 NOTE — Telephone Encounter (Signed)
 Husband picked up PAP

## 2024-08-27 ENCOUNTER — Encounter (INDEPENDENT_AMBULATORY_CARE_PROVIDER_SITE_OTHER): Payer: Self-pay | Admitting: Gastroenterology

## 2024-08-28 ENCOUNTER — Encounter: Payer: Self-pay | Admitting: Nurse Practitioner

## 2024-09-04 ENCOUNTER — Encounter: Attending: Sports Medicine | Admitting: Dietician

## 2024-09-04 ENCOUNTER — Encounter: Payer: Self-pay | Admitting: Dietician

## 2024-09-04 DIAGNOSIS — Z794 Long term (current) use of insulin: Secondary | ICD-10-CM | POA: Insufficient documentation

## 2024-09-04 DIAGNOSIS — N183 Chronic kidney disease, stage 3 unspecified: Secondary | ICD-10-CM | POA: Insufficient documentation

## 2024-09-04 DIAGNOSIS — E1122 Type 2 diabetes mellitus with diabetic chronic kidney disease: Secondary | ICD-10-CM | POA: Insufficient documentation

## 2024-09-04 NOTE — Progress Notes (Signed)
 Medical Nutrition Therapy  Appointment Start time: 1135  Appointment End time: 1155. This is a phone visit.  Patient is in her home and I am in my office.  Our last phone visit was 04/17/2024.  Primary concerns: She would like to learn more about nutrition that is good for diabetes and CKD Referral diagnosis: Type 2 diabetes and stage 3b CKD  Preferred learning style: no preference indicated Learning readiness: ready  NUTRITION ASSESSMENT  Anthropometrics  64 330 lbs 08/18/2024 328 lbs 04/15/2024 (fluid) 312 lbs 01/29/2024 313 lbs 11/21/2023 302 lbs 09/20/2023 301 lbs 01/25/2023 306 lbs 12/24/2022 317 lbs 09/26/2022 328 lbs 08/22/2022 skipped her fluid pill yesterday 318 lbs 08/09/2022  UBW  Clinical Medical Hx: Type 2 Diabetes, CKD stage 2b, lymphedema, OSA c-pap (using this more 2/24), GERD, HTN, HLD, anemia, vitamin D  deficiency Medications/Supplements: 25 units q am and  15 q HS Humulin  70/30 , Ozempic , torsemide , potassium, calcitriol, MVI,  Nexletol , olmesartan, iron, colace Labs: A1C 7.8% 08/18/2024 (increased with prednisone ), 7.1% 04/15/2024 decreased from 7.8% 01/08/24  BUN 28, Creatinine 1.65, Potassium 4.6 on 09/13/2023 and eGFR 35 01/26/2024 Lipid Panel     Component Value Date/Time   CHOL 273 (H) 07/31/2024 1115   TRIG 111 07/31/2024 1115   HDL 67 07/31/2024 1115   CHOLHDL 4.1 07/31/2024 1115   CHOLHDL 4.1 09/02/2019 0846   VLDL 15 04/12/2017 1121   LDLCALC 187 (H) 07/31/2024 1115   LDLCALC 131 (H) 09/02/2019 0846   LABVLDL 19 07/31/2024 1115   Lab Results  Component Value Date   CHOL 215 (H) 12/19/2023   CHOL 240 (H) 09/13/2023   CHOL 236 (H) 06/15/2023   Lab Results  Component Value Date   LDLCALC 131 (H) 12/19/2023   LDLCALC 158 (H) 09/13/2023   LDLCALC 145 (H) 06/15/2023   CGM:  Dexcom Notable Signs/Symptoms: swelling feet, fluid weight gain  Lifestyle & Dietary Hx Patient lives with her husband.  They share shopping and cooking.  She uses an engineer, petroleum  at the store.  Eats out 3-4 times per week (asks for a to-go box at the beginning of the meal) She is on disability.  She used to work as an loss adjuster, chartered for 40 years. Avoids pork, uses I can't believe it's not butter, raw vegetables make her nauseas Has dentures and unable to tolerate hard food  Estimated daily fluid intake: 64 oz fluid restriction Sleep: 6-7 hours hours - no longer wearing c-pap Stress / self-care: none Current average weekly physical activity: lymphedema and knee problems prohibit - doing some chair exercises CGM:  Dexcom G7 - occasional lows overnight (to 42 this week.    24-Hr Dietary Recall First Meal:  1/2 potato, chicken nuggets, 6 Lance PB crackers Snack:  none Second Meal:  baked chicken wings, peas, white rice (2/3 cups) Snack:  apple Third Meal:  1/2 baked potato, 3 chicken nuggets, 1/2 plum Snack:  granola bar Beverages: water , sugar free Twist, occasional regular gingerale, sugar free lemonade  Estimated Energy Needs Protein: 85-95 g  NUTRITION DIAGNOSIS  NB-1.1 Food and nutrition-related knowledge deficit As related to balance of carbohydrates, protein, and fat along with guidelines for CKD.  As evidenced by diet hx and patient report.  NUTRITION INTERVENTION  Nutrition education (E-1) on the following topics:  continued Discussed cholesterol labs Discussed diet changes necessary with high blood glucose Discussed DKA, what it is, when and how to test, when to seek help Discussed diet necessary with Ozempic , DM and kidney function  Handouts Provided Include - previous visit [facility] national kidney diet - Dish up a Kidney-Friendly Meal for Patients with Chronic Kidney Disease (not on dialysis) Label reading [employer] of Lifestyle Medicine packet.  Learning Style & Readiness for Change Teaching method utilized: Visual & Auditory  Demonstrated degree of understanding via: Teach Back  Barriers to learning/adherence to lifestyle change:  health issues  Goals Mindfulness:  Consistently scheduled meal - avoid skipping  Choices  Eat slowly  Away from distraction (sitting in kitchen or dining room)  Stop eating when satisfied  Before a snack ask, Am I hungry or eating for another reason?   What can I do instead if I am not hungry?  Try to find something every day that brings you joy!  Continue to be consistent with your medication! Avoid snacks if not hungry Low Fat Small portions Increase your vegetable intake  MONITORING & EVALUATION Dietary intake, weekly physical activity, and label reading in 3 months.  Next Steps  Patient is to call for questions.

## 2024-09-04 NOTE — Patient Instructions (Addendum)
 Mindfulness:  Consistently scheduled meal - avoid skipping  Choices  Eat slowly  Away from distraction (sitting in kitchen or dining room)  Stop eating when satisfied  Before a snack ask, Am I hungry or eating for another reason?   What can I do instead if I am not hungry?  Try to find something every day that brings you joy!  Continue to be consistent with your medication! Avoid snacks if not hungry Low Fat Small portions Increase your vegetable intake

## 2024-09-15 ENCOUNTER — Ambulatory Visit: Payer: Self-pay | Admitting: Family Medicine

## 2024-09-23 ENCOUNTER — Other Ambulatory Visit: Payer: Self-pay | Admitting: Nurse Practitioner

## 2024-09-23 DIAGNOSIS — Z794 Long term (current) use of insulin: Secondary | ICD-10-CM

## 2024-09-27 ENCOUNTER — Encounter: Payer: Self-pay | Admitting: Family Medicine

## 2024-09-29 MED ORDER — SCOPOLAMINE 1 MG/3DAYS TD PT72: 1.0000 | MEDICATED_PATCH | TRANSDERMAL | 12 refills | Status: AC

## 2024-09-30 ENCOUNTER — Telehealth: Payer: Self-pay | Admitting: *Deleted

## 2024-09-30 ENCOUNTER — Telehealth: Payer: Self-pay

## 2024-09-30 MED ORDER — PROMETHAZINE HCL 12.5 MG PO TABS: ORAL_TABLET | ORAL | 0 refills | Status: AC

## 2024-09-30 NOTE — Progress Notes (Signed)
   09/30/2024  Patient ID: Adrien FORBES Mix, female   DOB: [birth year], 63 y.o.   MRN: [id number]  Spoke with [facility] regarding copay of scopolamine - they cannot get box size of #10. However would still be $66.90 if they were able to. This is tier 4 with insurance. Meclizine is tier 2. Will recommend per PCP request.   Lang Sieve, PharmD, BCGP Clinical Pharmacist  [phone]

## 2024-09-30 NOTE — Telephone Encounter (Signed)
 Patient left a message that she would appreciate a call back.  Patient was called be[facility] , went to voicemail, message left. Shared that I would call her back tomorrow.

## 2024-09-30 NOTE — Addendum Note (Signed)
 Addended by: ANTONETTA ROLLENE BRAVO on: 09/30/2024 04:56 PM   Modules accepted: Orders

## 2024-09-30 NOTE — Telephone Encounter (Signed)
 Spoke to pharmacy, they recommend meclizine 25mg  as alternative

## 2024-10-02 NOTE — Telephone Encounter (Signed)
 I called the patient again and left a second voicemail. I ask that she call back and share what she is needing and I would be happy to call her back.

## 2024-10-16 ENCOUNTER — Ambulatory Visit: Admitting: Family Medicine

## 2024-10-28 ENCOUNTER — Other Ambulatory Visit: Payer: Self-pay | Admitting: Family Medicine

## 2024-10-28 DIAGNOSIS — E876 Hypokalemia: Secondary | ICD-10-CM

## 2024-11-09 ENCOUNTER — Encounter: Payer: Self-pay | Admitting: Family Medicine

## 2024-11-09 ENCOUNTER — Encounter: Payer: Self-pay | Admitting: Nurse Practitioner

## 2024-12-03 ENCOUNTER — Other Ambulatory Visit: Payer: Self-pay | Admitting: Family Medicine

## 2024-12-04 ENCOUNTER — Other Ambulatory Visit: Payer: Self-pay | Admitting: Family Medicine

## 2024-12-04 ENCOUNTER — Encounter: Payer: Self-pay | Admitting: Family Medicine

## 2024-12-04 DIAGNOSIS — E782 Mixed hyperlipidemia: Secondary | ICD-10-CM

## 2024-12-04 DIAGNOSIS — E1169 Type 2 diabetes mellitus with other specified complication: Secondary | ICD-10-CM

## 2024-12-04 DIAGNOSIS — I10 Essential (primary) hypertension: Secondary | ICD-10-CM

## 2024-12-04 DIAGNOSIS — E0789 Other specified disorders of thyroid: Secondary | ICD-10-CM

## 2024-12-04 DIAGNOSIS — E559 Vitamin D deficiency, unspecified: Secondary | ICD-10-CM

## 2024-12-04 NOTE — Telephone Encounter (Signed)
 Orders placed.

## 2024-12-05 ENCOUNTER — Encounter: Admitting: Dietician

## 2024-12-06 ENCOUNTER — Ambulatory Visit: Payer: Self-pay | Admitting: Family Medicine

## 2024-12-06 LAB — CMP14+EGFR
ALT: 8 [IU]/L (ref 0–32)
AST: 12 [IU]/L (ref 0–40)
Albumin: 4 g/dL (ref 3.9–4.9)
Alkaline Phosphatase: 126 [IU]/L (ref 49–135)
BUN/Creatinine Ratio: 23 (ref 12–28)
BUN: 31 mg/dL — ABNORMAL HIGH (ref 8–27)
Bilirubin Total: 0.5 mg/dL (ref 0.0–1.2)
CO2: 22 mmol/L (ref 20–29)
Calcium: 9.4 mg/dL (ref 8.7–10.3)
Chloride: 103 mmol/L (ref 96–106)
Creatinine, Ser: 1.32 mg/dL — ABNORMAL HIGH (ref 0.57–1.00)
Globulin, Total: 2.6 g/dL (ref 1.5–4.5)
Glucose: 143 mg/dL — ABNORMAL HIGH (ref 70–99)
Potassium: 5 mmol/L (ref 3.5–5.2)
Sodium: 141 mmol/L (ref 134–144)
Total Protein: 6.6 g/dL (ref 6.0–8.5)
eGFR: 45 mL/min/{1.73_m2} — ABNORMAL LOW

## 2024-12-06 LAB — CBC WITH DIFFERENTIAL/PLATELET
Basophils Absolute: 0.1 10*3/uL (ref 0.0–0.2)
Basos: 1 %
EOS (ABSOLUTE): 0.2 10*3/uL (ref 0.0–0.4)
Eos: 1 %
Hematocrit: 38.5 % (ref 34.0–46.6)
Hemoglobin: 12.6 g/dL (ref 11.1–15.9)
Immature Grans (Abs): 0.1 10*3/uL (ref 0.0–0.1)
Immature Granulocytes: 1 %
Lymphocytes Absolute: 3.2 10*3/uL — ABNORMAL HIGH (ref 0.7–3.1)
Lymphs: 31 %
MCH: 29.7 pg (ref 26.6–33.0)
MCHC: 32.7 g/dL (ref 31.5–35.7)
MCV: 91 fL (ref 79–97)
Monocytes Absolute: 0.6 10*3/uL (ref 0.1–0.9)
Monocytes: 6 %
Neutrophils Absolute: 6.4 10*3/uL (ref 1.4–7.0)
Neutrophils: 60 %
Platelets: 339 10*3/uL (ref 150–450)
RBC: 4.24 x10E6/uL (ref 3.77–5.28)
RDW: 13.4 % (ref 11.7–15.4)
WBC: 10.5 10*3/uL (ref 3.4–10.8)

## 2024-12-06 LAB — LIPID PANEL
Chol/HDL Ratio: 3.9 ratio (ref 0.0–4.4)
Cholesterol, Total: 170 mg/dL (ref 100–199)
HDL: 44 mg/dL
LDL Chol Calc (NIH): 106 mg/dL — ABNORMAL HIGH (ref 0–99)
Triglycerides: 111 mg/dL (ref 0–149)
VLDL Cholesterol Cal: 20 mg/dL (ref 5–40)

## 2024-12-06 LAB — TSH: TSH: 1.59 u[IU]/mL (ref 0.450–4.500)

## 2024-12-06 LAB — HEMOGLOBIN A1C
Est. average glucose Bld gHb Est-mCnc: 177 mg/dL
Hgb A1c MFr Bld: 7.8 % — ABNORMAL HIGH (ref 4.8–5.6)

## 2024-12-06 LAB — T4, FREE: Free T4: 1.32 ng/dL (ref 0.82–1.77)

## 2024-12-06 LAB — VITAMIN D 25 HYDROXY (VIT D DEFICIENCY, FRACTURES): Vit D, 25-Hydroxy: 29.5 ng/mL — ABNORMAL LOW (ref 30.0–100.0)

## 2024-12-12 ENCOUNTER — Encounter: Payer: Self-pay | Admitting: Family Medicine

## 2024-12-12 ENCOUNTER — Other Ambulatory Visit (HOSPITAL_COMMUNITY): Payer: Self-pay | Admitting: Family Medicine

## 2024-12-12 ENCOUNTER — Ambulatory Visit: Admitting: Family Medicine

## 2024-12-12 VITALS — BP 122/85 | HR 91 | Ht 65.0 in | Wt 330.0 lb

## 2024-12-12 DIAGNOSIS — Z794 Long term (current) use of insulin: Secondary | ICD-10-CM

## 2024-12-12 DIAGNOSIS — N182 Chronic kidney disease, stage 2 (mild): Secondary | ICD-10-CM | POA: Diagnosis not present

## 2024-12-12 DIAGNOSIS — I5032 Chronic diastolic (congestive) heart failure: Secondary | ICD-10-CM | POA: Diagnosis not present

## 2024-12-12 DIAGNOSIS — Z1211 Encounter for screening for malignant neoplasm of colon: Secondary | ICD-10-CM | POA: Insufficient documentation

## 2024-12-12 DIAGNOSIS — E785 Hyperlipidemia, unspecified: Secondary | ICD-10-CM

## 2024-12-12 DIAGNOSIS — I130 Hypertensive heart and chronic kidney disease with heart failure and stage 1 through stage 4 chronic kidney disease, or unspecified chronic kidney disease: Secondary | ICD-10-CM | POA: Diagnosis not present

## 2024-12-12 DIAGNOSIS — E1122 Type 2 diabetes mellitus with diabetic chronic kidney disease: Secondary | ICD-10-CM | POA: Diagnosis not present

## 2024-12-12 DIAGNOSIS — R001 Bradycardia, unspecified: Secondary | ICD-10-CM | POA: Diagnosis not present

## 2024-12-12 DIAGNOSIS — I10 Essential (primary) hypertension: Secondary | ICD-10-CM

## 2024-12-12 DIAGNOSIS — E113552 Type 2 diabetes mellitus with stable proliferative diabetic retinopathy, left eye: Secondary | ICD-10-CM

## 2024-12-12 DIAGNOSIS — E1159 Type 2 diabetes mellitus with other circulatory complications: Secondary | ICD-10-CM | POA: Diagnosis not present

## 2024-12-12 DIAGNOSIS — Z1231 Encounter for screening mammogram for malignant neoplasm of breast: Secondary | ICD-10-CM

## 2024-12-12 NOTE — Assessment & Plan Note (Signed)
 Controlled, no change in medication DASH diet and commitment to daily physical activity for a minimum of 30 minutes discussed and encouraged, as a part of hypertension management. The importance of attaining a healthy weight is also discussed.     12/12/2024    1:07 PM 12/12/2024    1:03 PM 08/18/2024    3:15 PM 04/24/2024    7:40 PM 04/24/2024    6:24 PM 04/24/2024    6:20 PM 04/15/2024    3:07 PM  BP/Weight  Systolic BP 122 174 116 134  138 122  Diastolic BP 85 81 80 61  58 84  Wt. (Lbs)  330 330  328.2  328.2  BMI  54.91 kg/m2 54.91 kg/m2  54.62 kg/m2  54.62 kg/m2

## 2024-12-12 NOTE — Assessment & Plan Note (Signed)
 Diabetes associated with hypertension, hyperlipidemia, obesity, and arthritis  [name] is reminded of the importance of commitment to daily physical activity for 30 minutes or more, as able and the need to limit carbohydrate intake to 30 to 60 grams per meal to help with blood sugar control.   The need to take medication as prescribed, test blood sugar as directed, and to call between visits if there is a concern that blood sugar is uncontrolled is also discussed.   [name] is reminded of the importance of daily foot exam, annual eye examination, and good blood sugar, blood pressure and cholesterol control.     Latest Ref Rng & Units 12/05/2024   11:20 AM 08/18/2024    4:02 PM 07/31/2024   11:15 AM 04/15/2024    3:34 PM 04/14/2024    9:15 AM  Diabetic Labs  HbA1c 4.8 - 5.6 % 7.8  7.8   7.1    Chol 100 - 199 mg/dL 829   726   728   HDL >60 mg/dL 44   67   61   Calc LDL 0 - 99 mg/dL 893   812   809   Triglycerides 0 - 149 mg/dL 888   888   885   Creatinine 0.57 - 1.00 mg/dL 8.67           8/69/7973    1:07 PM 12/12/2024    1:03 PM 08/18/2024    3:15 PM 04/24/2024    7:40 PM 04/24/2024    6:24 PM 04/24/2024    6:20 PM 04/15/2024    3:07 PM  BP/Weight  Systolic BP 122 174 116 134  138 122  Diastolic BP 85 81 80 61  58 84  Wt. (Lbs)  330 330  328.2  328.2  BMI  54.91 kg/m2 54.91 kg/m2  54.62 kg/m2  54.62 kg/m2      Latest Ref Rng & Units 01/29/2024   12:00 AM 11/01/2023    2:41 PM  Foot/eye exam completion dates  Eye Exam No Retinopathy No Retinopathy     Retinopathy         This result is from an external source.

## 2024-12-12 NOTE — Assessment & Plan Note (Addendum)
 Asymptomatic and no personal or f/h of colon cancer or precancerous polyps, routine screen due, refer

## 2024-12-12 NOTE — Patient Instructions (Addendum)
" °  F/U in 6 months   Please schedule mammogram at checkout  You are referred for colonscopy and to see cardiology for 1 year check  Labs are excellent, work on getting blood sugar under 7.0   Urine ACR feb 12 or later   Foot exam today  It is important that you exercise regularly at least 30 minutes 5 times a week. If you develop chest pain, have severe difficulty breathing, or feel very tired, stop exercising immediately and seek medical attention   Think about what you will eat, plan ahead. Choose  clean, green, fresh or frozen over canned, processed or packaged foods which are more sugary, salty and fatty. 70 to 75% of food eaten should be vegetables and fruit. Three meals at set times with snacks allowed between meals, but they must be fruit or vegetables. Aim to eat over a 12 hour period , example 7 am to 7 pm, and STOP after  your last meal of the day. Drink water ,generally about 64 ounces per day, no other drink is as healthy. Fruit juice is best enjoyed in a healthy way, by EATING the fruit.  Thanks for choosing [facility], we consider it a privelige to serve you.   "

## 2024-12-12 NOTE — Assessment & Plan Note (Signed)
 Hyperlipidemia:Low fat diet discussed and encouraged.   Lipid Panel  Lab Results  Component Value Date   CHOL 170 12/05/2024   HDL 44 12/05/2024   LDLCALC 106 (H) 12/05/2024   TRIG 111 12/05/2024   CHOLHDL 3.9 12/05/2024     Not at goal , needs to lower fat intake

## 2024-12-12 NOTE — Assessment & Plan Note (Signed)
" °  Patient re-educated about  the importance of commitment to a  minimum of 150 minutes of exercise per week as able.  The importance of healthy food choices with portion control discussed, as well as eating regularly and within a 12 hour window most days. The need to choose clean , green food 50 to 75% of the time is discussed, as well as to make water  the primary drink and set a goal of 64 ounces water  daily.       12/12/2024    1:03 PM 08/18/2024    3:15 PM 04/24/2024    6:24 PM  Weight /BMI  Weight 330 lb 330 lb 328 lb 3.2 oz  Height 5' 5 (1.651 m) 5' 5 (1.651 m) 5' 5 (1.651 m)  BMI 54.91 kg/m2 54.91 kg/m2 54.62 kg/m2    unchanged "

## 2024-12-12 NOTE — Assessment & Plan Note (Signed)
 Diabetes associated with hypertension, hyperlipidemia, obesity, and arthritis  Kendra Hanson is reminded of the importance of commitment to daily physical activity for 30 minutes or more, as able and the need to limit carbohydrate intake to 30 to 60 grams per meal to help with blood sugar control.   The need to take medication as prescribed, test blood sugar as directed, and to call between visits if there is a concern that blood sugar is uncontrolled is also discussed.   Kendra Hanson is reminded of the importance of daily foot exam, annual eye examination, and good blood sugar, blood pressure and cholesterol control.     Latest Ref Rng & Units 12/05/2024   11:20 AM 08/18/2024    4:02 PM 07/31/2024   11:15 AM 04/15/2024    3:34 PM 04/14/2024    9:15 AM  Diabetic Labs  HbA1c 4.8 - 5.6 % 7.8  7.8   7.1    Chol 100 - 199 mg/dL 829   726   728   HDL >60 mg/dL 44   67   61   Calc LDL 0 - 99 mg/dL 893   812   809   Triglycerides 0 - 149 mg/dL 888   888   885   Creatinine 0.57 - 1.00 mg/dL 8.67           8/69/7973    1:07 PM 12/12/2024    1:03 PM 08/18/2024    3:15 PM 04/24/2024    7:40 PM 04/24/2024    6:24 PM 04/24/2024    6:20 PM 04/15/2024    3:07 PM  BP/Weight  Systolic BP 122 174 116 134  138 122  Diastolic BP 85 81 80 61  58 84  Wt. (Lbs)  330 330  328.2  328.2  BMI  54.91 kg/m2 54.91 kg/m2  54.62 kg/m2  54.62 kg/m2      Latest Ref Rng & Units 12/12/2024    1:00 PM 01/29/2024   12:00 AM  Foot/eye exam completion dates  Eye Exam No Retinopathy  No Retinopathy      Foot Form Completion  Done      This result is from an external source.      Improving Managed by endo

## 2024-12-12 NOTE — Progress Notes (Signed)
 "  Kendra Hanson     MRN: [id number]      DOB: [birth year]  Chief Complaint  Patient presents with   Medical Management of Chronic Issues    4 month follow up     HPI Kendra Hanson is here for follow up and re-evaluation of chronic medical conditions, medication management and review of any available recent lab and radiology data.  Preventive health is updated, specifically  Cancer screening and Immunization.   Questions or concerns regarding consultations or procedures which the PT has had in the interim are  addressed. The PT denies any adverse reactions to current medications since the last visit.  There are no new concerns.  There are no specific complaints   ROS Denies recent fever or chills. Denies sinus pressure, nasal congestion, ear pain or sore throat. Denies chest congestion, productive cough or wheezing. Denies chest pains, palpitations and leg swelling Denies abdominal pain, nausea, vomiting,diarrhea or constipation.   Denies dysuria, frequency, hesitancy or incontinence. chronic joint pain, swelling and limitation in mobility. Denies headaches, seizures, numbness, or tingling. Denies depression, anxiety or insomnia. Denies skin break down or rash.   PE  BP 122/85   Pulse 91   Ht 5' 5 (1.651 m)   Wt (!) 330 lb (149.7 kg)   SpO2 95%   BMI 54.91 kg/m   Patient alert and oriented and in no cardiopulmonary distress.  HEENT: No facial asymmetry, EOMI,     Neck supple .  Chest: Clear to auscultation bilaterally.  CVS: S1, S2 no murmurs, no S3.Regular rate.  ABD: Soft non tender.   Ext: No edema  MS: decreased  ROM spine, shoulders, hips and knees.  Skin: Intact, no ulcerations or rash noted.  Psych: Good eye contact, normal affect. Memory intact not anxious or depressed appearing.  CNS: CN 2-12 intact, power,  normal throughout.no focal deficits noted.   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   The need to take medication as prescribed, test blood sugar as directed, and to call between visits if there is a concern that blood sugar is uncontrolled is also discussed.   Kendra Hanson is reminded of the importance of daily foot exam, annual eye examination, and good blood sugar, blood pressure and cholesterol control.     Latest Ref Rng & Units 12/05/2024   11:20 AM 08/18/2024    4:02 PM 07/31/2024   11:15 AM 04/15/2024    3:34 PM 04/14/2024    9:15 AM  Diabetic Labs  HbA1c 4.8 - 5.6 % 7.8  7.8   7.1    Chol 100 - 199 mg/dL 829   726   728   HDL >60 mg/dL 44   67   61   Calc LDL 0 - 99 mg/dL 893   812   809   Triglycerides 0 - 149 mg/dL 888   888   885   Creatinine 0.57 - 1.00 mg/dL 8.67           8/69/7973    1:07 PM 12/12/2024    1:03 PM 08/18/2024    3:15 PM 04/24/2024  7:40 PM 04/24/2024    6:24 PM 04/24/2024    6:20 PM 04/15/2024    3:07 PM  BP/Weight  Systolic BP 122 174 116 134  138 122  Diastolic BP 85 81 80 61  58 84  Wt. (Lbs)  330 330  328.2  328.2  BMI  54.91 kg/m2 54.91 kg/m2  54.62 kg/m2  54.62 kg/m2      Latest Ref Rng & Units 01/29/2024   12:00 AM 11/01/2023    2:41 PM  Foot/eye exam completion dates  Eye Exam No Retinopathy No Retinopathy     Retinopathy         This result is from an external source.        Type 2 diabetes mellitus with stage 2 chronic kidney disease, with long-term current use of insulin  (HCC) Diabetes associated with hypertension, hyperlipidemia, obesity, and arthritis  Kendra Hanson is reminded of the importance of commitment to daily physical activity for 30 minutes or more, as  able and the need to limit carbohydrate intake to 30 to 60 grams per meal to help with blood sugar control.   The need to take medication as prescribed, test blood sugar as directed, and to call between visits if there is a concern that blood sugar is uncontrolled is also discussed.   Kendra Hanson is reminded of the importance of daily foot exam, annual eye examination, and good blood sugar, blood pressure and cholesterol control.     Latest Ref Rng & Units 12/05/2024   11:20 AM 08/18/2024    4:02 PM 07/31/2024   11:15 AM 04/15/2024    3:34 PM 04/14/2024    9:15 AM  Diabetic Labs  HbA1c 4.8 - 5.6 % 7.8  7.8   7.1    Chol 100 - 199 mg/dL 829   726   728   HDL >60 mg/dL 44   67   61   Calc LDL 0 - 99 mg/dL 893   812   809   Triglycerides 0 - 149 mg/dL 888   888   885   Creatinine 0.57 - 1.00 mg/dL 8.67           8/69/7973    1:07 PM 12/12/2024    1:03 PM 08/18/2024    3:15 PM 04/24/2024    7:40 PM 04/24/2024    6:24 PM 04/24/2024    6:20 PM 04/15/2024    3:07 PM  BP/Weight  Systolic BP 122 174 116 134  138 122  Diastolic BP 85 81 80 61  58 84  Wt. (Lbs)  330 330  328.2  328.2  BMI  54.91 kg/m2 54.91 kg/m2  54.62 kg/m2  54.62 kg/m2      Latest Ref Rng & Units 12/12/2024    1:00 PM 01/29/2024   12:00 AM  Foot/eye exam completion dates  Eye Exam No Retinopathy  No Retinopathy      Foot Form Completion  Done      This result is from an external source.      Improving Managed by endo  Primary hypertension Controlled, no change in medication DASH diet and commitment to daily physical activity for a minimum of 30 minutes discussed and encouraged, as a part of hypertension management. The importance of attaining a healthy weight is also discussed.     12/12/2024    1:07 PM 12/12/2024    1:03 PM 08/18/2024    3:15 PM 04/24/2024    7:40 PM 04/24/2024  6:24 PM 04/24/2024    6:20 PM 04/15/2024    3:07 PM  BP/Weight  Systolic BP 122 174 116 134  138 122  Diastolic BP 85 81 80 61  58 84   Wt. (Lbs)  330 330  328.2  328.2  BMI  54.91 kg/m2 54.91 kg/m2  54.62 kg/m2  54.62 kg/m2       Morbid obesity (HCC)  Patient re-educated about  the importance of commitment to a  minimum of 150 minutes of exercise per week as able.  The importance of healthy food choices with portion control discussed, as well as eating regularly and within a 12 hour window most days. The need to choose clean , green food 50 to 75% of the time is discussed, as well as to make water  the primary drink and set a goal of 64 ounces water  daily.       12/12/2024    1:03 PM 08/18/2024    3:15 PM 04/24/2024    6:24 PM  Weight /BMI  Weight 330 lb 330 lb 328 lb 3.2 oz  Height 5' 5 (1.651 m) 5' 5 (1.651 m) 5' 5 (1.651 m)  BMI 54.91 kg/m2 54.91 kg/m2 54.62 kg/m2    unchanged  Hyperlipidemia LDL goal <100 Hyperlipidemia:Low fat diet discussed and encouraged.   Lipid Panel  Lab Results  Component Value Date   CHOL 170 12/05/2024   HDL 44 12/05/2024   LDLCALC 106 (H) 12/05/2024   TRIG 111 12/05/2024   CHOLHDL 3.9 12/05/2024     Not at goal , needs to lower fat intake  Asymptomatic bradycardia Annual f/u with cardiology past due  (HFpEF) heart failure with preserved ejection fraction (HCC) No s/s of decompensation at visit Annual f/u with Cardiology past due , will refer  Colon cancer screening Asymptomatic and no personal or f/h of colon cancer or precancerous polyps, routine screen due, refer   "

## 2024-12-12 NOTE — Assessment & Plan Note (Signed)
 Annual f/u with cardiology past due

## 2024-12-18 LAB — COMPREHENSIVE METABOLIC PANEL WITH GFR
ALT: 9 [IU]/L (ref 0–32)
AST: 15 [IU]/L (ref 0–40)
Albumin: 4.2 g/dL (ref 3.9–4.9)
Alkaline Phosphatase: 120 [IU]/L (ref 49–135)
BUN/Creatinine Ratio: 16 (ref 12–28)
BUN: 25 mg/dL (ref 8–27)
Bilirubin Total: 0.6 mg/dL (ref 0.0–1.2)
CO2: 23 mmol/L (ref 20–29)
Calcium: 9.6 mg/dL (ref 8.7–10.3)
Chloride: 100 mmol/L (ref 96–106)
Creatinine, Ser: 1.55 mg/dL — ABNORMAL HIGH (ref 0.57–1.00)
Globulin, Total: 2.6 g/dL (ref 1.5–4.5)
Glucose: 139 mg/dL — ABNORMAL HIGH (ref 70–99)
Potassium: 4.8 mmol/L (ref 3.5–5.2)
Sodium: 141 mmol/L (ref 134–144)
Total Protein: 6.8 g/dL (ref 6.0–8.5)
eGFR: 37 mL/min/{1.73_m2} — ABNORMAL LOW

## 2024-12-18 LAB — TSH: TSH: 2.89 u[IU]/mL (ref 0.450–4.500)

## 2024-12-18 LAB — T4, FREE: Free T4: 1.41 ng/dL (ref 0.82–1.77)

## 2024-12-19 LAB — MICROALBUMIN / CREATININE URINE RATIO
Creatinine, Urine: 152 mg/dL
Microalb/Creat Ratio: 64 mg/g{creat} — ABNORMAL HIGH (ref 0–29)
Microalbumin, Urine: 97.2 ug/mL

## 2024-12-22 ENCOUNTER — Ambulatory Visit: Admitting: Nurse Practitioner

## 2024-12-22 ENCOUNTER — Encounter: Admitting: Nutrition

## 2024-12-22 DIAGNOSIS — E559 Vitamin D deficiency, unspecified: Secondary | ICD-10-CM

## 2024-12-22 DIAGNOSIS — Z789 Other specified health status: Secondary | ICD-10-CM

## 2024-12-22 DIAGNOSIS — E782 Mixed hyperlipidemia: Secondary | ICD-10-CM

## 2024-12-22 DIAGNOSIS — I10 Essential (primary) hypertension: Secondary | ICD-10-CM

## 2024-12-22 DIAGNOSIS — R5383 Other fatigue: Secondary | ICD-10-CM

## 2024-12-22 DIAGNOSIS — Z7985 Long-term (current) use of injectable non-insulin antidiabetic drugs: Secondary | ICD-10-CM

## 2024-12-22 DIAGNOSIS — Z794 Long term (current) use of insulin: Secondary | ICD-10-CM

## 2024-12-26 ENCOUNTER — Encounter: Admitting: Dietician

## 2025-02-02 ENCOUNTER — Ambulatory Visit

## 2025-02-02 ENCOUNTER — Ambulatory Visit (HOSPITAL_COMMUNITY)

## 2025-03-10 ENCOUNTER — Ambulatory Visit

## 2025-06-11 ENCOUNTER — Ambulatory Visit: Payer: Self-pay | Admitting: Family Medicine
